# Patient Record
Sex: Female | Born: 1965
Health system: Southern US, Community
[De-identification: ages and names within clinical notes are randomized; demographics above are authoritative.]

## PROBLEM LIST (undated history)

## (undated) DIAGNOSIS — N189 Chronic kidney disease, unspecified: Secondary | ICD-10-CM

## (undated) DIAGNOSIS — I10 Essential (primary) hypertension: Secondary | ICD-10-CM

## (undated) DIAGNOSIS — R0600 Dyspnea, unspecified: Secondary | ICD-10-CM

## (undated) DIAGNOSIS — F419 Anxiety disorder, unspecified: Secondary | ICD-10-CM

## (undated) DIAGNOSIS — E039 Hypothyroidism, unspecified: Secondary | ICD-10-CM

## (undated) DIAGNOSIS — E079 Disorder of thyroid, unspecified: Secondary | ICD-10-CM

## (undated) DIAGNOSIS — I2510 Atherosclerotic heart disease of native coronary artery without angina pectoris: Secondary | ICD-10-CM

## (undated) DIAGNOSIS — F329 Major depressive disorder, single episode, unspecified: Secondary | ICD-10-CM

## (undated) DIAGNOSIS — I509 Heart failure, unspecified: Secondary | ICD-10-CM

## (undated) DIAGNOSIS — Z9189 Other specified personal risk factors, not elsewhere classified: Secondary | ICD-10-CM

## (undated) DIAGNOSIS — F32A Depression, unspecified: Secondary | ICD-10-CM

## (undated) DIAGNOSIS — R011 Cardiac murmur, unspecified: Secondary | ICD-10-CM

## (undated) DIAGNOSIS — D649 Anemia, unspecified: Secondary | ICD-10-CM

## (undated) DIAGNOSIS — D219 Benign neoplasm of connective and other soft tissue, unspecified: Secondary | ICD-10-CM

## (undated) DIAGNOSIS — Z9889 Other specified postprocedural states: Secondary | ICD-10-CM

## (undated) HISTORY — DX: Benign neoplasm of connective and other soft tissue, unspecified: D21.9

## (undated) HISTORY — DX: Anemia, unspecified: D64.9

## (undated) HISTORY — DX: Disorder of thyroid, unspecified: E07.9

---

## 2000-11-10 ENCOUNTER — Encounter: Admission: RE | Admit: 2000-11-10 | Discharge: 2000-11-10 | Payer: Self-pay | Admitting: Cardiology

## 2000-11-10 ENCOUNTER — Encounter: Payer: Self-pay | Admitting: Cardiology

## 2012-08-04 DIAGNOSIS — Z9289 Personal history of other medical treatment: Secondary | ICD-10-CM

## 2012-08-04 HISTORY — DX: Personal history of other medical treatment: Z92.89

## 2012-08-04 LAB — HM MAMMOGRAPHY: HM Mammogram: NORMAL

## 2012-10-25 ENCOUNTER — Inpatient Hospital Stay (HOSPITAL_COMMUNITY)
Admission: EM | Admit: 2012-10-25 | Discharge: 2012-11-01 | DRG: 286 | Disposition: A | Discharge status: Home / Self Care | Payer: 59 | Attending: Cardiology | Admitting: Cardiology

## 2012-10-25 ENCOUNTER — Emergency Department (HOSPITAL_COMMUNITY): Payer: 59

## 2012-10-25 ENCOUNTER — Encounter (HOSPITAL_COMMUNITY): Payer: Self-pay

## 2012-10-25 ENCOUNTER — Emergency Department (INDEPENDENT_AMBULATORY_CARE_PROVIDER_SITE_OTHER): Payer: 59

## 2012-10-25 ENCOUNTER — Encounter (HOSPITAL_COMMUNITY): Payer: Self-pay | Admitting: Emergency Medicine

## 2012-10-25 ENCOUNTER — Inpatient Hospital Stay (HOSPITAL_COMMUNITY): Payer: 59

## 2012-10-25 ENCOUNTER — Emergency Department (INDEPENDENT_AMBULATORY_CARE_PROVIDER_SITE_OTHER)
Admission: EM | Admit: 2012-10-25 | Discharge: 2012-10-25 | Disposition: A | Discharge status: Other Acute Inpatient Hospital | Payer: 59 | Source: Home / Self Care | Attending: Emergency Medicine | Admitting: Emergency Medicine

## 2012-10-25 DIAGNOSIS — I428 Other cardiomyopathies: Secondary | ICD-10-CM | POA: Diagnosis present

## 2012-10-25 DIAGNOSIS — N926 Irregular menstruation, unspecified: Secondary | ICD-10-CM | POA: Diagnosis present

## 2012-10-25 DIAGNOSIS — I10 Essential (primary) hypertension: Secondary | ICD-10-CM | POA: Diagnosis present

## 2012-10-25 DIAGNOSIS — R0609 Other forms of dyspnea: Secondary | ICD-10-CM

## 2012-10-25 DIAGNOSIS — D649 Anemia, unspecified: Secondary | ICD-10-CM | POA: Diagnosis present

## 2012-10-25 DIAGNOSIS — J90 Pleural effusion, not elsewhere classified: Secondary | ICD-10-CM

## 2012-10-25 DIAGNOSIS — I5043 Acute on chronic combined systolic (congestive) and diastolic (congestive) heart failure: Secondary | ICD-10-CM | POA: Diagnosis present

## 2012-10-25 DIAGNOSIS — Z87891 Personal history of nicotine dependence: Secondary | ICD-10-CM

## 2012-10-25 DIAGNOSIS — R0989 Other specified symptoms and signs involving the circulatory and respiratory systems: Secondary | ICD-10-CM

## 2012-10-25 DIAGNOSIS — R778 Other specified abnormalities of plasma proteins: Secondary | ICD-10-CM

## 2012-10-25 DIAGNOSIS — Z9119 Patient's noncompliance with other medical treatment and regimen: Secondary | ICD-10-CM

## 2012-10-25 DIAGNOSIS — I5042 Chronic combined systolic (congestive) and diastolic (congestive) heart failure: Secondary | ICD-10-CM | POA: Diagnosis present

## 2012-10-25 DIAGNOSIS — E876 Hypokalemia: Secondary | ICD-10-CM | POA: Diagnosis present

## 2012-10-25 DIAGNOSIS — D259 Leiomyoma of uterus, unspecified: Secondary | ICD-10-CM | POA: Diagnosis present

## 2012-10-25 DIAGNOSIS — Z9889 Other specified postprocedural states: Secondary | ICD-10-CM

## 2012-10-25 DIAGNOSIS — I5021 Acute systolic (congestive) heart failure: Secondary | ICD-10-CM | POA: Diagnosis present

## 2012-10-25 DIAGNOSIS — Z9189 Other specified personal risk factors, not elsewhere classified: Secondary | ICD-10-CM | POA: Diagnosis present

## 2012-10-25 DIAGNOSIS — D500 Iron deficiency anemia secondary to blood loss (chronic): Secondary | ICD-10-CM | POA: Diagnosis present

## 2012-10-25 DIAGNOSIS — D250 Submucous leiomyoma of uterus: Secondary | ICD-10-CM | POA: Diagnosis present

## 2012-10-25 DIAGNOSIS — J9600 Acute respiratory failure, unspecified whether with hypoxia or hypercapnia: Secondary | ICD-10-CM | POA: Diagnosis present

## 2012-10-25 DIAGNOSIS — E0781 Sick-euthyroid syndrome: Secondary | ICD-10-CM | POA: Diagnosis present

## 2012-10-25 DIAGNOSIS — I110 Hypertensive heart disease with heart failure: Principal | ICD-10-CM | POA: Diagnosis present

## 2012-10-25 DIAGNOSIS — R7989 Other specified abnormal findings of blood chemistry: Secondary | ICD-10-CM | POA: Diagnosis present

## 2012-10-25 DIAGNOSIS — R0600 Dyspnea, unspecified: Secondary | ICD-10-CM

## 2012-10-25 DIAGNOSIS — Z885 Allergy status to narcotic agent status: Secondary | ICD-10-CM

## 2012-10-25 DIAGNOSIS — Z91199 Patient's noncompliance with other medical treatment and regimen due to unspecified reason: Secondary | ICD-10-CM

## 2012-10-25 DIAGNOSIS — I509 Heart failure, unspecified: Secondary | ICD-10-CM | POA: Diagnosis present

## 2012-10-25 DIAGNOSIS — R946 Abnormal results of thyroid function studies: Secondary | ICD-10-CM | POA: Diagnosis present

## 2012-10-25 HISTORY — DX: Essential (primary) hypertension: I10

## 2012-10-25 HISTORY — DX: Other specified postprocedural states: Z98.890

## 2012-10-25 HISTORY — DX: Heart failure, unspecified: I50.9

## 2012-10-25 HISTORY — DX: Other specified personal risk factors, not elsewhere classified: Z91.89

## 2012-10-25 LAB — CBC WITH DIFFERENTIAL/PLATELET
Basophils Absolute: 0 10*3/uL (ref 0.0–0.1)
Basophils Relative: 0 % (ref 0–1)
Eosinophils Absolute: 0 10*3/uL (ref 0.0–0.7)
Eosinophils Relative: 0 % (ref 0–5)
HCT: 23.9 % — ABNORMAL LOW (ref 36.0–46.0)
Hemoglobin: 6.5 g/dL — CL (ref 12.0–15.0)
Lymphocytes Relative: 23 % (ref 12–46)
Lymphs Abs: 1.4 10*3/uL (ref 0.7–4.0)
MCH: 17.2 pg — ABNORMAL LOW (ref 26.0–34.0)
MCHC: 27.2 g/dL — ABNORMAL LOW (ref 30.0–36.0)
MCV: 63.4 fL — ABNORMAL LOW (ref 78.0–100.0)
Monocytes Absolute: 0.3 10*3/uL (ref 0.1–1.0)
Monocytes Relative: 5 % (ref 3–12)
Neutro Abs: 4.6 10*3/uL (ref 1.7–7.7)
Neutrophils Relative %: 72 % (ref 43–77)
Platelets: 223 10*3/uL (ref 150–400)
RBC: 3.77 MIL/uL — ABNORMAL LOW (ref 3.87–5.11)
RDW: 21.9 % — ABNORMAL HIGH (ref 11.5–15.5)
WBC: 6.3 10*3/uL (ref 4.0–10.5)

## 2012-10-25 LAB — URINALYSIS, ROUTINE W REFLEX MICROSCOPIC
Bilirubin Urine: NEGATIVE
Glucose, UA: NEGATIVE mg/dL
Ketones, ur: NEGATIVE mg/dL
Leukocytes, UA: NEGATIVE
Nitrite: NEGATIVE
Protein, ur: >300 mg/dL — AB
Specific Gravity, Urine: 1.028 (ref 1.005–1.030)
Urobilinogen, UA: 0.2 mg/dL (ref 0.0–1.0)
pH: 6.5 (ref 5.0–8.0)

## 2012-10-25 LAB — PRO B NATRIURETIC PEPTIDE: Pro B Natriuretic peptide (BNP): 3831 pg/mL — ABNORMAL HIGH (ref 0–125)

## 2012-10-25 LAB — COMPREHENSIVE METABOLIC PANEL
ALT: 13 U/L (ref 0–35)
AST: 34 U/L (ref 0–37)
Albumin: 2.9 g/dL — ABNORMAL LOW (ref 3.5–5.2)
Alkaline Phosphatase: 72 U/L (ref 39–117)
BUN: 10 mg/dL (ref 6–23)
CO2: 21 mEq/L (ref 19–32)
Calcium: 9 mg/dL (ref 8.4–10.5)
Chloride: 105 mEq/L (ref 96–112)
Creatinine, Ser: 0.56 mg/dL (ref 0.50–1.10)
GFR calc Af Amer: >90 mL/min (ref >90)
GFR calc non Af Amer: >90 mL/min (ref >90)
Glucose, Bld: 93 mg/dL (ref 70–99)
Potassium: 3.6 mEq/L (ref 3.5–5.1)
Sodium: 138 mEq/L (ref 135–145)
Total Bilirubin: 0.2 mg/dL — ABNORMAL LOW (ref 0.3–1.2)
Total Protein: 7.7 g/dL (ref 6.0–8.3)

## 2012-10-25 LAB — CK TOTAL AND CKMB (NOT AT ARMC)
CK, MB: 10 ng/mL (ref 0.3–4.0)
Relative Index: 2.7 — ABNORMAL HIGH (ref 0.0–2.5)
Total CK: 364 U/L — ABNORMAL HIGH (ref 7–177)

## 2012-10-25 LAB — D-DIMER, QUANTITATIVE: D-Dimer, Quant: 2.62 ug/mL-FEU — ABNORMAL HIGH (ref 0.00–0.48)

## 2012-10-25 LAB — POCT I-STAT TROPONIN I: Troponin i, poc: 0.1 ng/mL (ref 0.00–0.08)

## 2012-10-25 LAB — URINE MICROSCOPIC-ADD ON

## 2012-10-25 LAB — DIRECT ANTIGLOBULIN TEST (NOT AT ARMC)
DAT, IgG: NEGATIVE
DAT, complement: NEGATIVE

## 2012-10-25 LAB — RETICULOCYTES
RBC.: 4.11 MIL/uL (ref 3.87–5.11)
Retic Count, Absolute: 53.4 10*3/uL (ref 19.0–186.0)
Retic Ct Pct: 1.3 % (ref 0.4–3.1)

## 2012-10-25 LAB — ABO/RH: ABO/RH(D): A POS

## 2012-10-25 LAB — APTT: aPTT: 38 seconds — ABNORMAL HIGH (ref 24–37)

## 2012-10-25 LAB — TROPONIN I
Troponin I: <0.3 ng/mL (ref <0.30)
Troponin I: <0.3 ng/mL (ref <0.30)

## 2012-10-25 LAB — MRSA PCR SCREENING: MRSA by PCR: NEGATIVE

## 2012-10-25 LAB — LACTATE DEHYDROGENASE: LDH: 334 U/L — ABNORMAL HIGH (ref 94–250)

## 2012-10-25 LAB — OCCULT BLOOD, POC DEVICE: Fecal Occult Bld: NEGATIVE

## 2012-10-25 LAB — PROTIME-INR
INR: 1.02 (ref 0.00–1.49)
Prothrombin Time: 13.3 seconds (ref 11.6–15.2)

## 2012-10-25 LAB — PREPARE RBC (CROSSMATCH)

## 2012-10-25 MED ORDER — IOHEXOL 350 MG/ML SOLN
100.0000 mL | Freq: Once | INTRAVENOUS | Status: AC | PRN
  Administered 2012-10-25: 60 mL via INTRAVENOUS

## 2012-10-25 MED ORDER — ACETAMINOPHEN 325 MG PO TABS: 650.0000 mg | ORAL_TABLET | ORAL | Status: DC | PRN

## 2012-10-25 MED ORDER — SODIUM CHLORIDE 0.9 % IV SOLN: 250.0000 mL | INTRAVENOUS | Status: DC | PRN

## 2012-10-25 MED ORDER — SODIUM CHLORIDE 0.9 % IJ SOLN
3.0000 mL | Freq: Two times a day (BID) | INTRAMUSCULAR | Status: DC
  Administered 2012-10-25 – 2012-10-26 (×3): 3 mL via INTRAVENOUS

## 2012-10-25 MED ORDER — SODIUM CHLORIDE 0.9 % IJ SOLN: 3.0000 mL | INTRAMUSCULAR | Status: DC | PRN

## 2012-10-25 MED ORDER — FUROSEMIDE 10 MG/ML IJ SOLN
20.0000 mg | Freq: Once | INTRAMUSCULAR | Status: AC
  Administered 2012-10-25: 20 mg via INTRAVENOUS

## 2012-10-25 MED ORDER — FUROSEMIDE 10 MG/ML IJ SOLN
40.0000 mg | Freq: Once | INTRAMUSCULAR | Status: DC
  Filled 2012-10-25: qty 4

## 2012-10-25 MED ORDER — ONDANSETRON HCL 4 MG/2ML IJ SOLN: 4.0000 mg | Freq: Four times a day (QID) | INTRAMUSCULAR | Status: DC | PRN

## 2012-10-25 MED ORDER — FUROSEMIDE 10 MG/ML IJ SOLN
40.0000 mg | Freq: Every day | INTRAMUSCULAR | Status: DC
  Administered 2012-10-26 – 2012-10-27 (×2): 40 mg via INTRAVENOUS
  Filled 2012-10-25 (×2): qty 4

## 2012-10-25 MED ORDER — METOPROLOL TARTRATE 1 MG/ML IV SOLN
5.0000 mg | Freq: Once | INTRAVENOUS | Status: AC
  Administered 2012-10-25: 5 mg via INTRAVENOUS
  Filled 2012-10-25: qty 5

## 2012-10-25 MED ORDER — FUROSEMIDE 10 MG/ML IJ SOLN
40.0000 mg | Freq: Every day | INTRAMUSCULAR | Status: DC
  Administered 2012-10-25: 40 mg via INTRAVENOUS
  Filled 2012-10-25 (×2): qty 4

## 2012-10-25 MED ORDER — CARVEDILOL 3.125 MG PO TABS
3.1250 mg | ORAL_TABLET | Freq: Two times a day (BID) | ORAL | Status: DC
  Administered 2012-10-26 – 2012-10-28 (×5): 3.125 mg via ORAL
  Filled 2012-10-25 (×8): qty 1

## 2012-10-25 NOTE — H&P (Signed)
History and Physical       Hospital Admission Note Date: 10/25/2012  Patient name: Miranda [name] Medical record number: [id number] Date of birth: 07/02/1966 Age: 47 y.o. Gender: female PCP: Patient has no PCP    Chief Complaint:  Dyspnea worse in the last 3 days  HPI: Patient is a 47 year old female with history of hypertension (has not been taking medication for last 10 years) was sent from the [facility] urgent care facility for worsening shortness of breath. History was obtained from the patient who stated that she had noticed increased shortness of breath with exertion for last 3 days since Friday. She had 2 pillow orthopnea and was unable to sleep last night due to shortness of breath. She denied any chest pain. She has had history of hypertension but stated that she had kept it down herself and was not taking medications for last 10 years. Otherwise she denied any chest pain, any fever chills or productive cough. She had been coughing some due to congestion. She denied any abdominal pain nausea, vomiting or any hematemesis. ED workup showed severe anemia with hemoglobin of 6.5, hematocrit 23.9, troponin positive at 0.1, elevated d-dimer at 2.62. At the time of triage BP was 182/109, HR 111, at the time of my encounter, HR is in 120-130's, sinus tachycardia, BP 171/101. CT angiogram chest has been done but the results are pending. Patient also reported that she has irregular menstrual periods, last month had about 9-10 days long period with clots, today she had "little". Patient also has not seen a PCP or GYN MD in years.   Review of Systems:  Constitutional: Denies fever, chills, diaphoresis, + poor appetite with fatigue.  HEENT: Denies photophobia, eye pain, redness, hearing loss, ear pain, congestion, sore throat, rhinorrhea, sneezing, mouth sores, trouble swallowing, neck pain, neck stiffness and tinnitus.   Respiratory: Please see history  of present illness Cardiovascular: Please see history of present illness Gastrointestinal: Denies nausea, vomiting, abdominal pain, diarrhea, constipation, blood in stool and abdominal distention.  Genitourinary: Denies dysuria, urgency, frequency, hematuria, flank pain and difficulty urinating. Please see history of present illness Musculoskeletal: Denies myalgias, back pain, joint swelling, arthralgias and gait problem.  Skin: Denies rash and wound. ++pallor Neurological: Denies seizures, syncope,  numbness and headaches. + dizzyness Hematological: Denies adenopathy. Easy bruising, personal or family bleeding history  Psychiatric/Behavioral: Denies suicidal ideation, mood changes, confusion, nervousness, sleep disturbance and agitation  Past Medical History: Past Medical History  Diagnosis Date  . Hypertension    History reviewed. No pertinent past surgical history.  Medications: Prior to Admission medications   Medication Sig Start Date End Date Taking? Authorizing Provider  oxymetazoline (AFRIN) 0.05 % nasal spray Place 2 sprays into the nose 2 (two) times daily.   Yes Historical Provider, MD  Phenol-Glycerin (VICKS 44 SORE THROAT MT) Take 15 mLs by mouth every 6 (six) hours as needed (cough).    Yes Historical Provider, MD    Allergies:   Allergies  Allergen Reactions  . Codeine Hives    Social History: Patient states that she smokes one cigar a day, drinks alcohol occasionally and uses marijuana as recreational drug, she lives at home with her son. She is functional with her ADLs  Family History: History reviewed. Patient denies any history of coronary artery disease, CHF in her family. No family history of pulmonary embolism or DVT  Physical Exam: Blood pressure 171/101, pulse 117, temperature 97.9 F (36.6 C), temperature source Oral, resp. rate 35, SpO2 93.00%. General:  Alert, awake, oriented x3, in no acute distress, uncomfortable, very pale appearing. HEENT:  normocephalic, atraumatic, anicteric sclera, pale conjunctiva, pupils equal and reactive to light and accomodation, oropharynx clear Neck: supple, no masses or lymphadenopathy, no goiter, no bruits + JVP Heart: Tachycardia, Regular rate and rhythm, without murmurs, rubs or gallops. Lungs: Bibasilar crackles  Abdomen: Soft, nontender, nondistended, positive bowel sounds, no masses. Extremities: No clubbing, cyanosis or edema with positive pedal pulses. Neuro: Grossly intact, no focal neurological deficits, strength 5/5 upper and lower extremities bilaterally Psych: alert and oriented x 3, normal mood and affect Skin: no rashes or lesions, warm and dry   LABS on Admission:  Basic Metabolic Panel:  Recent Labs Lab 10/25/12 1306  NA 138  K 3.6  CL 105  CO2 21  GLUCOSE 93  BUN 10  CREATININE 0.56  CALCIUM 9.0   Liver Function Tests:  Recent Labs Lab 10/25/12 1306  AST 34  ALT 13  ALKPHOS 72  BILITOT 0.2*  PROT 7.7  ALBUMIN 2.9*   CBC:  Recent Labs Lab 10/25/12 1306  WBC 6.3  NEUTROABS 4.6  HGB 6.5*  HCT 23.9*  MCV 63.4*  PLT 223   Cardiac Enzymes: No results found for this basename: CKTOTAL, CKMB, CKMBINDEX, TROPONINI,  in the last 168 hours BNP: No components found with this basename: POCBNP,  CBG: No results found for this basename: GLUCAP,  in the last 168 hours   Radiological Exams on Admission: Dg Chest 2 View  10/25/2012  *RADIOLOGY REPORT*  Clinical Data: Shortness of breath and chest pain  CHEST - 2 VIEW  Comparison: None.  Findings: Two-view exam shows bibasilar airspace disease with small bilateral pleural effusions.  Cardiopericardial silhouette is enlarged. Imaged bony structures of the thorax are intact.  IMPRESSION: Enlargement of the cardiopericardial silhouette with bibasilar airspace disease and small bilateral pleural effusions.  Imaging features may be related to dependent edema or infection. Pericardial effusion is not excluded.   Original  Report Authenticated By: Kennith Center, M.D.    Dg Abd 1 View  10/25/2012  *RADIOLOGY REPORT*  Clinical Data: Generalized abdominal pain  ABDOMEN - 1 VIEW  Comparison: None.  Findings: Supine view of the abdomen shows no gaseous bowel dilatation to suggest obstruction.  There are some phleboliths over the inferior anatomic pelvis bilaterally.  Otherwise no unexpected abdominopelvic calcification.  The visualized bony structures are normal.  IMPRESSION: Normal bowel gas pattern.   Original Report Authenticated By: Kennith Center, M.D.    Ct Angio Chest Pe W/cm &/or Wo Cm  10/25/2012  *RADIOLOGY REPORT*  Clinical Data: Cough, fever and shortness of breath.  CT ANGIOGRAPHY CHEST  Technique:  Multidetector CT imaging of the chest using the standard protocol during bolus administration of intravenous contrast. Multiplanar reconstructed images including MIPs were obtained and reviewed to evaluate the vascular anatomy.  Contrast: 60mL OMNIPAQUE IOHEXOL 350 MG/ML SOLN  Comparison: Chest radiographs obtained earlier today.  Findings: Normally opacified pulmonary arteries with no pulmonary arterial filling defects seen.  Small to moderate-sized bilateral pleural effusions, larger on the right.  Enlarged heart.  Small pericardial effusion with a maximum thickness of 5 mm.  No lung masses. Mildly enlarged bilateral axillary lymph nodes, including a 9 mm [name] axis left axillary lymph node on image number 15 and 8 mm [name] axis right axillary lymph node on image number 26.  No enlarged mediastinal, hilar or upper abdominal lymph nodes.  The interstitial markings are prominent.  Mild bibasilar opacity with an  appearance most compatible with atelectasis.  Unremarkable bones and upper abdomen.  IMPRESSION:  1.  Cardiomegaly and changes of congestive heart failure with bilateral pleural effusions. 2.  Mild bibasilar atelectasis without definite pneumonia. 3.  Small pericardial effusion. 4.  Mild bilateral axillary adenopathy. 5.   No pulmonary emboli.   Original Report Authenticated By: Beckie Salts, M.D.     Assessment/Plan Principal Problem:   Dyspnea: Likely precipitated by severe anemia, acute CHF, accelerated hypertension due to noncompliance, small pericardial effusion on CT angiogram chest. Elevated troponin - Admit to step down, obtain serial cardiac enzymes, TSH, anemia panel - Patient needs to units of packed RBC transfusion but will also need Lasix in between and prior to transfusion due to acute CHF. - Obtain 2-D echocardiogram for further workup, assess EF, pericardial effusion - Placed on CHF pathway, Coreg, IV Lasix. Hold off on ASA secondary to symptomatic anemia (?bleeding).  Active problems Elevated troponins with accelerated hypertension and acute CHF, with tachycardia - Obtain serial cardiac enzymes, place on IV diuresis, strict I.'s and O.'s, daily weights, lopressor 5mg  IV x 1  - Placed on Coreg, obtain 2-D echocardiogram - CT angiogram of the chest was done and was negative for acute PE    Anemia-microcytic - Obtain anemia panel, TSH, urine pregnancy test - Obtain pelvic ultrasound to rule out any uterine fibroids - Patient denies any GI losses, however if the pelvic ultrasound is completely negative, she may need a hematology consult or/and an outpatient colonoscopy.  DVT prophylaxis: SCD's   CODE STATUS: Full Code  Further plan will depend as patient's clinical course evolves and further radiologic and laboratory data become available.   Time Spent on Admission: 1 hour  Markeesha Char M.D. [facility] 10/25/2012, 4:39 PM Pager: [phone]  If 7PM-7AM, please contact night-coverage [email or web address] Password [email or web address]

## 2012-10-25 NOTE — ED Provider Notes (Addendum)
History     CSN: [id number]  Arrival date & time 10/25/12  1010   First MD Initiated Contact with Patient 10/25/12 1021      Chief Complaint  Patient presents with  . Shortness of Breath    (Consider location/radiation/quality/duration/timing/severity/associated sxs/prior treatment) HPI Comments: Patient presents urgent care describing that she's been coughing daily for approximately 6 months. Since Friday she's been noticing increased shortness of breath with minimal activity and even with positional changes as she is describing that when she leans forward she breathes much better and she can barely tolerate being in a flat or supine position. Patient denies any chest pain at rest and describes that she has had high blood pressure for years but have been off treatment for many years. Patient denies any fevers body aches but does have a bit of a sinus congestion she describes. Denies any abdominal pain nausea or vomiting  Patient is a 47 y.o. female presenting with shortness of breath. The history is provided by the patient.  Shortness of Breath Severity:  Moderate Onset quality:  Gradual Duration:  3 days Chronicity:  Recurrent Context: activity   Relieved by:  Position changes Worsened by:  Movement, exertion, coughing and activity (Laying flat) Associated symptoms: cough   Associated symptoms: no abdominal pain, no chest pain, no diaphoresis, no headaches, no hemoptysis, no neck pain, no rash, no syncope, no vomiting and no wheezing   Risk factors: no family hx of DVT and no hx of PE/DVT     Past Medical History  Diagnosis Date  . Hypertension     History reviewed. No pertinent past surgical history.  History reviewed. No pertinent family history.  History  Substance Use Topics  . Smoking status: Not on file  . Smokeless tobacco: Not on file  . Alcohol Use: Not on file    OB History   Grav Para Term Preterm Abortions TAB SAB Ect Mult Living                   Review of Systems  Constitutional: Positive for activity change and fatigue. Negative for chills, diaphoresis, appetite change and unexpected weight change.  HENT: Negative for neck pain.   Respiratory: Positive for cough and shortness of breath. Negative for hemoptysis and wheezing.   Cardiovascular: Negative for chest pain, palpitations, leg swelling and syncope.  Gastrointestinal: Negative for vomiting and abdominal pain.  Skin: Negative for color change, rash and wound.  Neurological: Negative for headaches.    Allergies  Review of patient's allergies indicates no known allergies.  Home Medications  No current outpatient prescriptions on file.  BP 182/109  Pulse 111  Temp(Src) 99.3 F (37.4 C) (Oral)  Resp 26  SpO2 100%  Physical Exam  Nursing note and vitals reviewed. Constitutional: She appears well-developed.  Non-toxic appearance. She does not have a sickly appearance. She appears ill. No distress.  Cardiovascular: Normal pulses.  Tachycardia present.  Exam reveals no gallop and no friction rub.   No murmur heard. Pulmonary/Chest: Breath sounds normal. No respiratory distress. She has no decreased breath sounds. She has no rhonchi. She has no rales.  Neurological: She is alert.    ED Course  Procedures (including critical care time)  Labs Reviewed - No data to display Dg Chest 2 View  10/25/2012  *RADIOLOGY REPORT*  Clinical Data: Shortness of breath and chest pain  CHEST - 2 VIEW  Comparison: None.  Findings: Two-view exam shows bibasilar airspace disease with small bilateral pleural  effusions.  Cardiopericardial silhouette is enlarged. Imaged bony structures of the thorax are intact.  IMPRESSION: Enlargement of the cardiopericardial silhouette with bibasilar airspace disease and small bilateral pleural effusions.  Imaging features may be related to dependent edema or infection. Pericardial effusion is not excluded.   Original Report Authenticated By: Misty Stanley,  M.D.    Dg Abd 1 View  10/25/2012  *RADIOLOGY REPORT*  Clinical Data: Generalized abdominal pain  ABDOMEN - 1 VIEW  Comparison: None.  Findings: Supine view of the abdomen shows no gaseous bowel dilatation to suggest obstruction.  There are some phleboliths over the inferior anatomic pelvis bilaterally.  Otherwise no unexpected abdominopelvic calcification.  The visualized bony structures are normal.  IMPRESSION: Normal bowel gas pattern.   Original Report Authenticated By: Misty Stanley, M.D.      1. Dyspnea   2. Pleural effusion    EKG- sinus tachycardia with a ventricular rate of 106. Electrocardiographic signs consistent with left ventricular and atrial enlargement nonspecific T-wave abnormalities no ST segment elevation or depression is noted PR interval and QRS duration within normal no acute T. prolongation  Bedside informational (non-diagnostic) ultrasound done where a parasternal long axis view was performed- was inconclusive as we were looking for obvious signs of a pericardial effusion. MDM  Symptomatology most suggestive of pericarditis we'll transfer patient to the emergency department for further evaluation. Patient is tachycardic- hypertensive. Initial imaging it suggestive of a suspected pericardial effusion. Patient is hemodynamically stable. Dyspneic- and symptomatic for 6 months- exacerbation, the last 3 days.       Rosana Hoes, MD 10/25/12 1235  Rosana Hoes, MD 10/25/12 [phone]

## 2012-10-25 NOTE — ED Notes (Signed)
Patient transported to CT 

## 2012-10-25 NOTE — ED Notes (Signed)
Called blood bank, stating blood not ready at this time. Notified patient will be transported up to [id number]

## 2012-10-25 NOTE — ED Provider Notes (Signed)
History     CSN: [id number]  Arrival date & time 10/25/12  1243   First MD Initiated Contact with Patient 10/25/12 1419      Chief Complaint  Patient presents with  . Shortness of Breath    (Consider location/radiation/quality/duration/timing/severity/associated sxs/prior treatment) Patient is a 47 y.o. female presenting with shortness of breath. The history is provided by the patient.  Shortness of Breath Associated symptoms: no abdominal pain, no chest pain, no cough, no fever, no headaches, no neck pain, no rash and no vomiting   pt c/o dyspnea for the past 3-4 days. Constant, persistent. Worse w exertion. Also notes faint/lightheadedness when stands, has been feeling generally weak for past few days. Denies any chest pain or discomfort. Denies hx lung or heart disease. Former smoker. Denies leg pain or swelling. No hx dvt or pe. Denies personal or family hx cad. No hx chf. Denies orthopnea or pnd. Denies cough or uri c/o. No fever or chills   Pt appears pale. Denies hx anemia or prior fe use. Denies recent blood loss. States is peri-menopausal.  Denies any recent vaginal bleeding or hx heavy bleeding w periods. Denies rectal bleeding or melena, x single episode small am brbpr after episode straining/constipatuion a couple weeks ago. No hx gi bleeding. No hx diverticula or pud. No asa or nsaid use. Denies hx renal disease. No abd pain. No nvd. No wt change. Pt went to urgent care and sent to ed.     Past Medical History  Diagnosis Date  . Hypertension     History reviewed. No pertinent past surgical history.  History reviewed. No pertinent family history.  History  Substance Use Topics  . Smoking status: Not on file  . Smokeless tobacco: Not on file  . Alcohol Use: Not on file    OB History   Grav Para Term Preterm Abortions TAB SAB Ect Mult Living                  Review of Systems  Constitutional: Negative for fever and chills.  HENT: Negative for neck pain.    Eyes: Negative for redness.  Respiratory: Positive for shortness of breath. Negative for cough.   Cardiovascular: Negative for chest pain and leg swelling.  Gastrointestinal: Negative for nausea, vomiting, abdominal pain, diarrhea and blood in stool.  Genitourinary: Negative for dysuria and flank pain.  Musculoskeletal: Negative for back pain.  Skin: Negative for rash.  Neurological: Positive for light-headedness. Negative for headaches.  Hematological: Does not bruise/bleed easily.  Psychiatric/Behavioral: Negative for confusion.    Allergies  Codeine  Home Medications   Current Outpatient Rx  Name  Route  Sig  Dispense  Refill  . oxymetazoline (AFRIN) 0.05 % nasal spray   Nasal   Place 2 sprays into the nose 2 (two) times daily.         . Phenol-Glycerin (VICKS 44 SORE THROAT MT)   Oral   Take 15 mLs by mouth every 6 (six) hours as needed (cough).            BP 161/103  Pulse 72  Temp(Src) 97.9 F (36.6 C) (Oral)  Resp 18  SpO2 100%  Physical Exam  Nursing note and vitals reviewed. Constitutional: She is oriented to person, place, and time. No distress.  Very pale appearing  HENT:  Head: Atraumatic.  Mouth/Throat: Oropharynx is clear and moist.  Eyes: Conjunctivae are normal. Pupils are equal, round, and reactive to light. No scleral icterus.  Neck: Neck  supple. No tracheal deviation present. No thyromegaly present.  Cardiovascular: Regular rhythm, normal heart sounds and intact distal pulses.   Pulmonary/Chest: Effort normal and breath sounds normal. No respiratory distress.  Abdominal: Soft. Normal appearance and bowel sounds are normal. She exhibits no distension and no mass. There is no tenderness. There is no rebound and no guarding.  Genitourinary:  Scant yellow stool, grossly negative.   Musculoskeletal: She exhibits no edema and no tenderness.  Neurological: She is alert and oriented to person, place, and time.  Skin: Skin is warm and dry. No rash  noted. She is not diaphoretic. There is pallor.  Psychiatric: She has a normal mood and affect.    ED Course  Procedures (including critical care time)  Results for orders placed during the hospital encounter of 10/25/12  CBC WITH DIFFERENTIAL      Result Value Range   WBC 6.3  4.0 - 10.5 K/uL   RBC 3.77 (*) 3.87 - 5.11 MIL/uL   Hemoglobin 6.5 (*) 12.0 - 15.0 g/dL   HCT 19.1 (*) 47.8 - 29.5 %   MCV 63.4 (*) 78.0 - 100.0 fL   MCH 17.2 (*) 26.0 - 34.0 pg   MCHC 27.2 (*) 30.0 - 36.0 g/dL   RDW 62.1 (*) 30.8 - 65.7 %   Platelets 223  150 - 400 K/uL   Neutrophils Relative 72  43 - 77 %   Lymphocytes Relative 23  12 - 46 %   Monocytes Relative 5  3 - 12 %   Eosinophils Relative 0  0 - 5 %   Basophils Relative 0  0 - 1 %   Neutro Abs 4.6  1.7 - 7.7 K/uL   Lymphs Abs 1.4  0.7 - 4.0 K/uL   Monocytes Absolute 0.3  0.1 - 1.0 K/uL   Eosinophils Absolute 0.0  0.0 - 0.7 K/uL   Basophils Absolute 0.0  0.0 - 0.1 K/uL   RBC Morphology POLYCHROMASIA PRESENT    COMPREHENSIVE METABOLIC PANEL      Result Value Range   Sodium 138  135 - 145 mEq/L   Potassium 3.6  3.5 - 5.1 mEq/L   Chloride 105  96 - 112 mEq/L   CO2 21  19 - 32 mEq/L   Glucose, Bld 93  70 - 99 mg/dL   BUN 10  6 - 23 mg/dL   Creatinine, Ser 8.46  0.50 - 1.10 mg/dL   Calcium 9.0  8.4 - 96.2 mg/dL   Total Protein 7.7  6.0 - 8.3 g/dL   Albumin 2.9 (*) 3.5 - 5.2 g/dL   AST 34  0 - 37 U/L   ALT 13  0 - 35 U/L   Alkaline Phosphatase 72  39 - 117 U/L   Total Bilirubin 0.2 (*) 0.3 - 1.2 mg/dL   GFR calc non Af Amer >90  >90 mL/min   GFR calc Af Amer >90  >90 mL/min  PRO B NATRIURETIC PEPTIDE      Result Value Range   Pro B Natriuretic peptide (BNP) 3831.0 (*) 0 - 125 pg/mL  D-DIMER, QUANTITATIVE      Result Value Range   D-Dimer, Quant 2.62 (*) 0.00 - 0.48 ug/mL-FEU  POCT I-STAT TROPONIN I      Result Value Range   Troponin i, poc 0.10 (*) 0.00 - 0.08 ng/mL   Comment NOTIFIED PHYSICIAN     Comment 3           OCCULT BLOOD,  POC DEVICE  Result Value Range   Fecal Occult Bld NEGATIVE  NEGATIVE  TYPE AND SCREEN      Result Value Range   ABO/RH(D) A POS     Antibody Screen PENDING     Sample Expiration 10/28/2012     Dg Chest 2 View  10/25/2012  *RADIOLOGY REPORT*  Clinical Data: Shortness of breath and chest pain  CHEST - 2 VIEW  Comparison: None.  Findings: Two-view exam shows bibasilar airspace disease with small bilateral pleural effusions.  Cardiopericardial silhouette is enlarged. Imaged bony structures of the thorax are intact.  IMPRESSION: Enlargement of the cardiopericardial silhouette with bibasilar airspace disease and small bilateral pleural effusions.  Imaging features may be related to dependent edema or infection. Pericardial effusion is not excluded.   Original Report Authenticated By: Kennith Center, M.D.    Dg Abd 1 View  10/25/2012  *RADIOLOGY REPORT*  Clinical Data: Generalized abdominal pain  ABDOMEN - 1 VIEW  Comparison: None.  Findings: Supine view of the abdomen shows no gaseous bowel dilatation to suggest obstruction.  There are some phleboliths over the inferior anatomic pelvis bilaterally.  Otherwise no unexpected abdominopelvic calcification.  The visualized bony structures are normal.  IMPRESSION: Normal bowel gas pattern.   Original Report Authenticated By: Kennith Center, M.D.    Ct Angio Chest Pe W/cm &/or Wo Cm  10/25/2012  *RADIOLOGY REPORT*  Clinical Data: Cough, fever and shortness of breath.  CT ANGIOGRAPHY CHEST  Technique:  Multidetector CT imaging of the chest using the standard protocol during bolus administration of intravenous contrast. Multiplanar reconstructed images including MIPs were obtained and reviewed to evaluate the vascular anatomy.  Contrast: 60mL OMNIPAQUE IOHEXOL 350 MG/ML SOLN  Comparison: Chest radiographs obtained earlier today.  Findings: Normally opacified pulmonary arteries with no pulmonary arterial filling defects seen.  Small to moderate-sized bilateral  pleural effusions, larger on the right.  Enlarged heart.  Small pericardial effusion with a maximum thickness of 5 mm.  No lung masses. Mildly enlarged bilateral axillary lymph nodes, including a 9 mm short axis left axillary lymph node on image number 15 and 8 mm short axis right axillary lymph node on image number 26.  No enlarged mediastinal, hilar or upper abdominal lymph nodes.  The interstitial markings are prominent.  Mild bibasilar opacity with an appearance most compatible with atelectasis.  Unremarkable bones and upper abdomen.  IMPRESSION:  1.  Cardiomegaly and changes of congestive heart failure with bilateral pleural effusions. 2.  Mild bibasilar atelectasis without definite pneumonia. 3.  Small pericardial effusion. 4.  Mild bilateral axillary adenopathy. 5.  No pulmonary emboli.   Original Report Authenticated By: Beckie Salts, M.D.       MDM  Iv ns. Monitor. Ecg. Labs. cxr reviewed from earlier.  hgb 6, pt symptomatic, weak/faint, mildly tachy, will transfuse.  Given dyspnea, markedly elevated ddimer, bil atelx on cxr, will get ct angio r/o pe.   Reviewed nursing notes and prior charts for additional history.    Date: 10/25/2012  Rate: 93  Rhythm: normal sinus rhythm  QRS Axis: normal  Intervals: normal  ST/T Wave abnormalities: normal  Conduction Disutrbances:lvh  Narrative Interpretation:   Old EKG Reviewed: none available  Ct neg for pe.   ?high output chf related to signif anemia.    Transfuse prbc.   [facility] called to admit.  Recheck pt. No cp or sob.  abd soft nt.            Suzi Roots, MD 10/25/12 [phone]

## 2012-10-25 NOTE — Progress Notes (Signed)
CRITICAL VALUE ALERT  Critical value received:  CKMB=10.0  Date of notification:  10/25/2012   Time of notification:  1848   Critical value read back:yes  Nurse who received alert:  Nyra Market  MD notified (1st page):  Dr. Isidoro Donning  Time of first page:  1853  MD notified (2nd page):  Time of second page:  Responding MD:  Dr. Isidoro Donning   Time MD responded:  [phone]

## 2012-10-25 NOTE — ED Notes (Signed)
Pt sent from [facility] with increased SOB x several days; pt sts [facility] said that he had fluid in lungs; pt denies CP or increased swelling

## 2012-10-25 NOTE — ED Notes (Signed)
Phlebotomy made aware patient in room and ready to draw blood.

## 2012-10-25 NOTE — ED Notes (Signed)
Results of i-stat Troponin shown to Dr. Denton Lank

## 2012-10-25 NOTE — ED Notes (Signed)
Reports she has been having cough for several months ( no one else in home affected ) history of HBP, off medication for years, treating herself w apple cider vinegar , no MD supervision

## 2012-10-26 ENCOUNTER — Encounter (HOSPITAL_COMMUNITY): Payer: Self-pay | Admitting: Internal Medicine

## 2012-10-26 DIAGNOSIS — J9600 Acute respiratory failure, unspecified whether with hypoxia or hypercapnia: Secondary | ICD-10-CM | POA: Diagnosis present

## 2012-10-26 DIAGNOSIS — D259 Leiomyoma of uterus, unspecified: Secondary | ICD-10-CM | POA: Diagnosis present

## 2012-10-26 DIAGNOSIS — R946 Abnormal results of thyroid function studies: Secondary | ICD-10-CM | POA: Diagnosis present

## 2012-10-26 LAB — TROPONIN I
Troponin I: <0.3 ng/mL (ref <0.30)
Troponin I: 0.44 ng/mL (ref <0.30)
Troponin I: 0.33 ng/mL (ref <0.30)

## 2012-10-26 LAB — LIPID PANEL
Cholesterol: 177 mg/dL (ref 0–200)
HDL: 41 mg/dL (ref >39)
LDL Cholesterol: 113 mg/dL — ABNORMAL HIGH (ref 0–99)
Total CHOL/HDL Ratio: 4.3 RATIO
Triglycerides: 115 mg/dL (ref <150)
VLDL: 23 mg/dL (ref 0–40)

## 2012-10-26 LAB — IRON AND TIBC
Iron: 17 ug/dL — ABNORMAL LOW (ref 42–135)
Saturation Ratios: 3 % — ABNORMAL LOW (ref 20–55)
TIBC: 491 ug/dL — ABNORMAL HIGH (ref 250–470)
UIBC: 474 ug/dL — ABNORMAL HIGH (ref 125–400)

## 2012-10-26 LAB — TYPE AND SCREEN
ABO/RH(D): A POS
Antibody Screen: NEGATIVE
DAT, IgG: NEGATIVE
Unit division: 0
Unit division: 0

## 2012-10-26 LAB — CK TOTAL AND CKMB (NOT AT ARMC)
CK, MB: 7.1 ng/mL (ref 0.3–4.0)
CK, MB: 7.1 ng/mL (ref 0.3–4.0)
Relative Index: 2.6 — ABNORMAL HIGH (ref 0.0–2.5)
Relative Index: 2.4 (ref 0.0–2.5)
Total CK: 274 U/L — ABNORMAL HIGH (ref 7–177)
Total CK: 292 U/L — ABNORMAL HIGH (ref 7–177)

## 2012-10-26 LAB — HEMOGLOBIN AND HEMATOCRIT, BLOOD
HCT: 32.7 % — ABNORMAL LOW (ref 36.0–46.0)
Hemoglobin: 10 g/dL — ABNORMAL LOW (ref 12.0–15.0)

## 2012-10-26 LAB — FOLATE: Folate: 16.9 ng/mL

## 2012-10-26 LAB — TSH: TSH: 38.232 u[IU]/mL — ABNORMAL HIGH (ref 0.350–4.500)

## 2012-10-26 LAB — BASIC METABOLIC PANEL
BUN: 11 mg/dL (ref 6–23)
CO2: 24 mEq/L (ref 19–32)
Calcium: 9 mg/dL (ref 8.4–10.5)
Chloride: 103 mEq/L (ref 96–112)
Creatinine, Ser: 0.69 mg/dL (ref 0.50–1.10)
GFR calc Af Amer: >90 mL/min (ref >90)
GFR calc non Af Amer: >90 mL/min (ref >90)
Glucose, Bld: 95 mg/dL (ref 70–99)
Potassium: 3.1 mEq/L — ABNORMAL LOW (ref 3.5–5.1)
Sodium: 140 mEq/L (ref 135–145)

## 2012-10-26 LAB — HAPTOGLOBIN: Haptoglobin: 223 mg/dL — ABNORMAL HIGH (ref 45–215)

## 2012-10-26 LAB — T4, FREE: Free T4: 0.95 ng/dL (ref 0.80–1.80)

## 2012-10-26 LAB — VITAMIN B12: Vitamin B-12: 546 pg/mL (ref 211–911)

## 2012-10-26 LAB — FERRITIN: Ferritin: 4 ng/mL — ABNORMAL LOW (ref 10–291)

## 2012-10-26 MED ORDER — POTASSIUM CHLORIDE CRYS ER 20 MEQ PO TBCR
40.0000 meq | EXTENDED_RELEASE_TABLET | Freq: Once | ORAL | Status: AC
  Administered 2012-10-26: 40 meq via ORAL
  Filled 2012-10-26: qty 2

## 2012-10-26 MED ORDER — SODIUM CHLORIDE 0.9 % IV SOLN
250.0000 mg | Freq: Once | INTRAVENOUS | Status: AC
  Administered 2012-10-26: 250 mg via INTRAVENOUS
  Filled 2012-10-26 (×2): qty 5

## 2012-10-26 MED ORDER — SODIUM CHLORIDE 0.9 % IV SOLN
25.0000 mg | Freq: Once | INTRAVENOUS | Status: AC
  Administered 2012-10-26: 25 mg via INTRAVENOUS
  Filled 2012-10-26: qty 0.5

## 2012-10-26 MED ORDER — FERROUS GLUCONATE 324 (38 FE) MG PO TABS
324.0000 mg | ORAL_TABLET | Freq: Two times a day (BID) | ORAL | Status: DC
  Administered 2012-10-27 – 2012-11-01 (×12): 324 mg via ORAL
  Filled 2012-10-26 (×15): qty 1

## 2012-10-26 MED ORDER — POTASSIUM CHLORIDE CRYS ER 20 MEQ PO TBCR
40.0000 meq | EXTENDED_RELEASE_TABLET | Freq: Every day | ORAL | Status: DC
  Administered 2012-10-27 – 2012-11-01 (×6): 40 meq via ORAL
  Filled 2012-10-26 (×6): qty 2

## 2012-10-26 MED ORDER — POTASSIUM CHLORIDE CRYS ER 20 MEQ PO TBCR: 40.0000 meq | EXTENDED_RELEASE_TABLET | Freq: Every day | ORAL | Status: DC

## 2012-10-26 MED ORDER — POTASSIUM CHLORIDE CRYS ER 20 MEQ PO TBCR: 40.0000 meq | EXTENDED_RELEASE_TABLET | Freq: Once | ORAL | Status: DC

## 2012-10-26 MED ORDER — POTASSIUM CHLORIDE CRYS ER 20 MEQ PO TBCR
40.0000 meq | EXTENDED_RELEASE_TABLET | Freq: Once | ORAL | Status: DC
Start: 2012-10-26 — End: 2012-10-26

## 2012-10-26 NOTE — Care Management Note (Signed)
   CARE MANAGEMENT NOTE 10/26/2012  Patient:  Miranda [name], Miranda [name]   Account Number:  [id number]  Date Initiated:  10/26/2012  Documentation initiated by:  Jayton Popelka  Subjective/Objective Assessment:   Consult for CHF screening.     Action/Plan:   3/25 CM will follow for d/c needs and discuss with pt when pt more stable.   Anticipated DC Date:     Anticipated DC Plan:  HOME W HOME HEALTH SERVICES         Choice offered to / List presented to:          [facility] arranged  HH-10 DISEASE MANAGEMENT  HH-1 RN      Status of service: In process. Medicare Important Message given?   (If response is "NO", the following Medicare IM given date fields will be blank) Date Medicare IM given:   Date Additional Medicare IM given:    Discharge Disposition:    Per UR Regulation:    If discussed at Long Length of Stay Meetings, dates discussed:    Comments:

## 2012-10-26 NOTE — Progress Notes (Signed)
Event: Notified by RN that pt c/o chest pain that radiates to her (L) shoulder. Pt denies other symptoms. 12-Lead EKG done. NP to bedside. Subjective: Pt reports onset of SSCP that radiated to her (L) shoulder. States the pain pain felt like a muscle cramp. The pain lasted approx 15-20 and has since resolved. Nothing seemed to make pain worse. Massaging her (L) shoulder seemed to make it better. Pt denied that she had any N/V, dizziness, SOB or other associated symptoms. Is currently pain free. Objective: Miranda [name] is a 47 year old female with history of hypertension (has not been taking medication for last 10 years) was sent from the [facility] on 10/25/12 after presenting there for worsening shortness of breath over the last 3 days especially w/ exertion. She had 2 pillow orthopnea and was unable to sleep the previous night due to SOB. She denied any chest pain. She has had history of hypertension but stated that she had kept it down herself and was not taking medications for last 10 years. Otherwise she denied any chest pain, any fever chills or productive cough. She had been coughing some due to congestion. She denied any abdominal pain nausea, vomiting or any hematemesis.  ED workup showed severe anemia with hemoglobin of 6.5, hematocrit 23.9, troponin positive at 0.1, elevated d-dimer at 2.62. In ED BP was 182/109, HR 11. CT angiogram chest revealed no PE but positive findings c/w CHF thought secondary to uncontrolled HTN related to non-compliance. Initial elevated Troponin's felt d/y uncontrolled BP and CHF vs ACS. 2-D Echo results pending. At bedside pt found resting quietly w/ eyes closed. Current VS, T- 98.9, BP-125/77, P-86, R-17 w/ 02 sats of 97% on 2L [place]. BBS somewhat diminished but otherwise CTA. No chest wall TTP. Pain is not reproducible. Most recent Troponin @ 2020 0.33,  trending down from 0.44. 12-Lead EKG shows SR w/ rate of 87, w/o acute changes and is comparable to EKG from 10/25/12.   Assessment/Plan: 1. Chest pain: (Resolved). ACS considered though some MS component as well. EKG w/o acute changes. VSS, no associated symptoms. PE unremarkable. Will repeat Trop I with AM labs. Will defer any further changes in pt's plan of care to rounding MD. Discussed pt w/ Dr Joneen Roach who has also reviewed EKG and is in agreement w/ plan. Will continue to monitor closely.   Leanne Chang, NP-C [facility] Pager [phone]

## 2012-10-26 NOTE — Progress Notes (Signed)
IV iron started per MD order. Pt tolerated small first dose well, second dose started. At approximately 2230 pt sitting on side of bed, complaining of 7/10 chest pain that is doing down her left arm. Ekg obtained and NP Schorr notified. Pt VSS at this time. Will continue to monitor.

## 2012-10-26 NOTE — Progress Notes (Signed)
[facility] Progress Note [place] TEAM 1 - Stepdown/ICU TEAM   Miranda [name] DA:[id number] DOB: Sep 14, 1965 DOA: 10/25/2012 PCP: No primary provider on file.  Brief narrative: Patient is a 47 year old female with history of hypertension (has not been taking medication for last 10 years) was sent from the [facility] urgent care facility for worsening shortness of breath. History was obtained from the patient who stated that she had noticed increased shortness of breath with exertion for last 3 days since Friday. She had 2 pillow orthopnea and was unable to sleep last night due to shortness of breath. She denied any chest pain. She has had history of hypertension but stated that she had kept it down herself and was not taking medications for last 10 years. Otherwise she denied any chest pain, any fever chills or productive cough. She had been coughing some due to congestion. She denied any abdominal pain nausea, vomiting or any hematemesis.  ED workup showed severe anemia with hemoglobin of 6.5, hematocrit 23.9, troponin positive at 0.1, elevated d-dimer at 2.62. At the time of triage BP was 182/109, HR 111, at the time of my encounter, HR is in 120-130's, sinus tachycardia, BP 171/101. CT angiogram chest has been done but the results are pending.  Patient also reported that she has irregular menstrual periods, last month had about 9-10 days long period with clots, today she had "little". Patient also has not seen a PCP or GYN MD in years.    Assessment/Plan: Principal Problem:   Acute respiratory failure -Secondary to congestive heart failure from uncontrolled blood pressure compounded by anemia -Improving-continue Lasix at current dose -Await echo result  Active Problems:   Anemia-microcytic -Secondary to chronic blood loss from fibroid uterus -Has been transfused appropriately -Will need iron replacement as well    Elevated troponin -Due to uncontrolled blood pressure and congestive  heart failure rather than acute coronary syndrome    Accelerated hypertension -Currently BP is much better controlled    CHF, acute -As mentioned above continue Lasix and follow up on echo    Uterine fibroid -Will need outpatient followup with OB/GYN    Abnormal thyroid function test-likely sick euthyroid -Free T4 is normal-patient does not suggest any symptoms of hypothyroidism  Hypokalemia Continue to replace aggressively as she is on Lasix    Code Status: Full code Family Communication: None  Disposition Plan: Obtain her follow in step down for today  Consultants: None  Procedures: None  Antibiotics: None  DVT prophylaxis: SCDs  HPI/Subjective: Patient is breathing much better today than she was yesterday. No complaints of cough or chest pain. I have explained that her blood pressure needs aggressive control and that she likely has structural cardiac changes resulting in congestive heart failure in relation to prolonged uncontrolled blood pressure. She understands that we are awaiting an echo result further comment on this. In addition I've explained that she has a large uterine fibroid which is what is contributing to her anemia and will need to be followed up by OB/GYN with probable hysterectomy. In addition I've explained that she is severely iron deficient which will need to be replaced. I questioned her in regards to hypothyroid symptoms. She does admit to some dry hair and dry nails but otherwise is asymptomatic.   Objective: Blood pressure 125/93, pulse 97, temperature 98 F (36.7 C), temperature source Oral, resp. rate 14, height '5\' 4"'$  (1.626 m), weight 55.8 kg (123 lb 0.3 oz), SpO2 99.00%.  Intake/Output Summary (Last 24 hours) at 10/26/12  1907 Last data filed at 10/26/12 1800  Gross per 24 hour  Intake   1260 ml  Output   2700 ml  Net  -1440 ml     Exam: General: No acute respiratory distress Lungs: Clear to auscultation bilaterally without wheezes  or crackles Cardiovascular: Regular rate and rhythm without murmur gallop or rub normal S1 and S2 Abdomen: Nontender, nondistended, soft, bowel sounds positive, no rebound, no ascites, no appreciable mass Extremities: No significant cyanosis, clubbing, or edema bilateral lower extremities  Data Reviewed: Basic Metabolic Panel:  Recent Labs Lab 10/25/12 1306 10/26/12 0518  NA 138 140  K 3.6 3.1*  CL 105 103  CO2 21 24  GLUCOSE 93 95  BUN 10 11  CREATININE 0.56 0.69  CALCIUM 9.0 9.0   Liver Function Tests:  Recent Labs Lab 10/25/12 1306  AST 34  ALT 13  ALKPHOS 72  BILITOT 0.2*  PROT 7.7  ALBUMIN 2.9*   No results found for this basename: LIPASE, AMYLASE,  in the last 168 hours No results found for this basename: AMMONIA,  in the last 168 hours CBC:  Recent Labs Lab 10/25/12 1306 10/26/12 0518  WBC 6.3  --   NEUTROABS 4.6  --   HGB 6.5* 10.0*  HCT 23.9* 32.7*  MCV 63.4*  --   PLT 223  --    Cardiac Enzymes:  Recent Labs Lab 10/25/12 1606 10/25/12 1748 10/26/12 0518 10/26/12 1006  CKTOTAL  --  364* 274* 292*  CKMB  --  10.0* 7.1* 7.1*  TROPONINI <0.30 <0.30 <0.30 0.44*   BNP (last 3 results)  Recent Labs  10/25/12 1306  PROBNP 3831.0*   CBG: No results found for this basename: GLUCAP,  in the last 168 hours  Recent Results (from the past 240 hour(s))  MRSA PCR SCREENING     Status: None   Collection Time    10/25/12  5:34 PM      Result Value Range Status   MRSA by PCR NEGATIVE  NEGATIVE Final   Comment:            The GeneXpert MRSA Assay (FDA     approved for NASAL specimens     only), is one component of a     comprehensive MRSA colonization     surveillance program. It is not     intended to diagnose MRSA     infection nor to guide or     monitor treatment for     MRSA infections.     Studies:  Recent x-ray studies have been reviewed in detail by the Attending Physician  Scheduled Meds:  Scheduled Meds: . carvedilol   3.125 mg Oral BID WC  . furosemide  40 mg Intravenous Daily  . [START ON 10/27/2012] potassium chloride  40 mEq Oral Daily  . sodium chloride  3 mL Intravenous Q12H   Continuous Infusions:   Time spent on care of this patient: 35 minutes  [place]  [phone] Pager - Text Page per Shea Evans as per below:  On-Call/Text Page:      [email or web address]      password [email or web address]  If 7PM-7AM, please contact night-coverage [email or web address] Password [email or web address] 10/26/2012, 7:07 PM   LOS: 1 day

## 2012-10-26 NOTE — Progress Notes (Signed)
PT Cancellation Note  Patient Details Name: Lakresha Stifter MRN: [id number] DOB: 18-Jan-1966   Cancelled Treatment:    Reason Eval/Treat Not Completed: Medical issues which prohibited therapy (Pt with elevated CKMB.)   Shaquanna Lycan 10/26/2012, 9:20 AM

## 2012-10-26 NOTE — Progress Notes (Signed)
  Echocardiogram 2D Echocardiogram has been performed.  Miranda [name] 10/26/2012, 4:42 PM

## 2012-10-27 LAB — TROPONIN I: Troponin I: 0.46 ng/mL (ref <0.30)

## 2012-10-27 LAB — BASIC METABOLIC PANEL
BUN: 16 mg/dL (ref 6–23)
CO2: 25 mEq/L (ref 19–32)
Calcium: 9.2 mg/dL (ref 8.4–10.5)
Chloride: 102 mEq/L (ref 96–112)
Creatinine, Ser: 0.74 mg/dL (ref 0.50–1.10)
GFR calc Af Amer: >90 mL/min (ref >90)
GFR calc non Af Amer: >90 mL/min (ref >90)
Glucose, Bld: 95 mg/dL (ref 70–99)
Potassium: 3.3 mEq/L — ABNORMAL LOW (ref 3.5–5.1)
Sodium: 138 mEq/L (ref 135–145)

## 2012-10-27 LAB — CBC
HCT: 32.8 % — ABNORMAL LOW (ref 36.0–46.0)
Hemoglobin: 9.6 g/dL — ABNORMAL LOW (ref 12.0–15.0)
MCH: 20.2 pg — ABNORMAL LOW (ref 26.0–34.0)
MCHC: 29.3 g/dL — ABNORMAL LOW (ref 30.0–36.0)
MCV: 68.9 fL — ABNORMAL LOW (ref 78.0–100.0)
Platelets: 194 10*3/uL (ref 150–400)
RBC: 4.76 MIL/uL (ref 3.87–5.11)
RDW: 25 % — ABNORMAL HIGH (ref 11.5–15.5)
WBC: 9.7 10*3/uL (ref 4.0–10.5)

## 2012-10-27 LAB — GLUCOSE, CAPILLARY: Glucose-Capillary: 126 mg/dL — ABNORMAL HIGH (ref 70–99)

## 2012-10-27 MED ORDER — POTASSIUM CHLORIDE CRYS ER 20 MEQ PO TBCR
40.0000 meq | EXTENDED_RELEASE_TABLET | Freq: Once | ORAL | Status: AC
  Administered 2012-10-27: 40 meq via ORAL
  Filled 2012-10-27: qty 2

## 2012-10-27 MED ORDER — FUROSEMIDE 40 MG PO TABS
40.0000 mg | ORAL_TABLET | Freq: Every day | ORAL | Status: DC
  Filled 2012-10-27: qty 1

## 2012-10-27 MED ORDER — FUROSEMIDE 40 MG PO TABS
40.0000 mg | ORAL_TABLET | Freq: Every day | ORAL | Status: DC
  Administered 2012-10-28: 40 mg via ORAL
  Filled 2012-10-27 (×3): qty 1

## 2012-10-27 NOTE — Plan of Care (Signed)
Problem: Food- and Nutrition-Related Knowledge Deficit (NB-1.1) Goal: Nutrition education Formal process to instruct or train a patient/client in a skill or to impart knowledge to help patients/clients voluntarily manage or modify food choices and eating behavior to maintain or improve health. Outcome: Completed/Met Date Met:  10/27/12 Nutrition Education Note  RN requested RD see patient for nutrition education regarding new onset CHF.  RD provided "Low Sodium Nutrition Therapy" handout from the Academy of [employer]etics. Reviewed patient's dietary recall. Provided examples on ways to decrease sodium intake in diet. Discouraged intake of processed foods and use of salt shaker. Encouraged fresh fruits and vegetables as well as whole grain sources of carbohydrates to maximize fiber intake.   RD discussed why it is important for patient to adhere to diet recommendations, and emphasized the role of fluids, foods to avoid, and importance of weighing self daily. Teach back method used.  Expect good compliance.  Body mass index is 21.18 kg/(m^2). Pt meets criteria for normal weight based on current BMI.  Current diet order is Heart Healthy, patient reports that she is eating well. Labs and medications reviewed. No further nutrition interventions warranted at this time. RD contact information provided. If additional nutrition issues arise, please re-consult RD.   Joaquin Courts, RD, LDN, CNSC Pager# [phone] After Hours Pager# [phone]

## 2012-10-27 NOTE — Evaluation (Signed)
Physical Therapy Evaluation Patient Details Name: Miranda [name] MRN: [id number] DOB: 11-Apr-1966 Today's Date: 10/27/2012 Time: 4098-1191 PT Time Calculation (min): 23 min  PT Assessment / Plan / Recommendation Clinical Impression  Patient is a 47 y/o female admitted with SOB with anemia, acute CHF and uncontrlled HTN.  She presents to PT with decreased acitivity tolerance, decreased balance, decreased strength and will benefit from skilled PT in the actue setting to maximize independence and allow d/c home with son's assist.    PT Assessment  Patient needs continued PT services    Follow Up Recommendations  No PT follow up (due to pt reports has to go have hysterectomy due to fibroids)       Barriers to Discharge Inaccessible home environment      Equipment Recommendations  None recommended by PT       Frequency Min 3X/week    Precautions / Restrictions Precautions Precautions: Fall   Pertinent Vitals/Pain Denies pain      Mobility  Bed Mobility Bed Mobility: Supine to Sit Supine to Sit: 6: Modified independent (Device/Increase time) Transfers Transfers: Sit to Stand;Stand to Sit Sit to Stand: 4: Min guard;From bed Stand to Sit: 4: Min guard;To bed Details for Transfer Assistance: self selected technique with UE assist, able to perform without UE assist with supervision Ambulation/Gait Ambulation/Gait Assistance: 4: Min guard Ambulation Distance (Feet): 800 Feet Assistive device: None Ambulation/Gait Assistance Details: mild loss of balance with minguard assist for safety throughout.  Demonstrates poor postural strength with head flexed and shoulders slumped forward Gait Pattern: Step-through pattern;Decreased stride length;Decreased hip/knee flexion - left;Decreased hip/knee flexion - right    Exercises General Exercises - Lower Extremity Hip ABduction/ADduction: Strengthening;Both;10 reps;Standing Hip Flexion/Marching: 10 reps;Both;Standing;Strengthening Toe  Raises: Both Heel Raises: Standing;10 reps;Strengthening Other Exercises Other Exercises: sit<>stand without UE support x 5 reps Other Exercises: seated postural strengthening with scapular squeezes x 10 reps 5 sec hold   PT Diagnosis: Abnormality of gait;Generalized weakness  PT Problem List: Decreased strength;Decreased activity tolerance;Decreased balance;Decreased mobility;Decreased knowledge of use of DME PT Treatment Interventions: DME instruction;Gait training;Stair training;Functional mobility training;Therapeutic activities;Patient/family education;Balance training   PT Goals Acute Rehab PT Goals PT Goal Formulation: With patient Time For Goal Achievement: 11/09/12 Potential to Achieve Goals: Good Pt will go Stand to Sit: Independently PT Goal: Stand to Sit - Progress: Goal set today Pt will Stand: Independently PT Goal: Stand - Progress: Goal set today Pt will Ambulate: >150 feet;Independently PT Goal: Ambulate - Progress: Goal set today Pt will Go Up / Down Stairs: Flight;with rail(s);with supervision PT Goal: Up/Down Stairs - Progress: Goal set today Pt will Perform Home Exercise Program: Independently PT Goal: Perform Home Exercise Program - Progress: Goal set today  Visit Information  Last PT Received On: 10/27/12 Assistance Needed: +1    Subjective Data  Subjective: Feel okay today.  Back sore sitting up in bed. Patient Stated Goal: to return home with son's help   Prior Functioning  Home Living Lives With: Son Available Help at Discharge: Family;Available PRN/intermittently Type of Home: House Home Access: Level entry Home Layout: Two level;Bed/bath upstairs Alternate Level Stairs-Number of Steps: flight Alternate Level Stairs-Rails: Right Home Adaptive Equipment: None Prior Function Level of Independence: Independent Able to Take Stairs?: Yes Driving: Yes Communication Communication: No difficulties    Cognition  Cognition Overall Cognitive  Status: Appears within functional limits for tasks assessed/performed Arousal/Alertness: Awake/alert Orientation Level: Appears intact for tasks assessed Behavior During Session: [facility] for tasks performed    Extremity/Trunk  Assessment Right Lower Extremity Assessment RLE ROM/Strength/Tone: Deficits RLE ROM/Strength/Tone Deficits: grossly 4/5 throughout RLE Sensation: WFL - Light Touch Left Lower Extremity Assessment LLE ROM/Strength/Tone: Deficits LLE ROM/Strength/Tone Deficits: grossly 4/5 throughout LLE Sensation: WFL - Light Touch   Balance Balance Balance Assessed: Yes Static Standing Balance Static Standing - Balance Support: No upper extremity supported Static Standing - Level of Assistance: 5: Stand by assistance Static Standing - Comment/# of Minutes: standing performing LE strength and balance activities  End of Session PT - End of Session Equipment Utilized During Treatment: Gait belt Activity Tolerance: Patient tolerated treatment well Patient left: in bed;with call bell/phone within reach;with nursing in room  GP     [facility] 10/27/2012, 3:27 PM Sheran Lawless, PT [phone] 10/27/2012

## 2012-10-27 NOTE — Progress Notes (Signed)
Report called, pt transferring to [id number] via w/c with belongings. Miranda [name]

## 2012-10-27 NOTE — Progress Notes (Signed)
[facility] Progress Note [place] TEAM 1 - Stepdown/ICU TEAM   [name] YQM:[id number] DOB: 1966-03-22 DOA: 10/25/2012 PCP: No primary provider on file.  Brief narrative: 47 year old female with history of hypertension (has not been taking medication for last 10 years) was sent from the [facility] for worsening shortness of breath. History was obtained from the patient who stated that she had noticed increased shortness of breath with exertion for 3 days. She had 2 pillow orthopnea and was unable to sleep due to shortness of breath. Otherwise she denied any chest pain, any fever chills or productive cough.  She denied any abdominal pain nausea, vomiting or any hematemesis.  ED workup showed severe anemia with hemoglobin of 6.5, hematocrit 23.9, troponin positive at 0.1, elevated d-dimer at 2.62. At the time of triage BP was 182/109, HR 111.  Patient also reported that she has irregular menstrual periods, about 9-10 day long periods with large clots. Patient also had not seen a PCP or GYN MD in years.   Assessment/Plan:  Acute respiratory failure -Secondary to congestive heart failure from uncontrolled blood pressure compounded by anemia -Improving - continue Lasix at current dose -CTA of chest w/o evidence of PE  Microcytic Fe deficient anemia -Secondary to chronic blood loss from fibroid uterus -Hgb nadir of 6.5 -Has been transfused appropriately -s/p IV Fe tx  Mildly elevated troponin - deeply inverted T waves on EKG -likely due to uncontrolled blood pressure and congestive heart failure/LVH rather than acute coronary syndrome -recheck EKG w/o signif change today -pt denies chest pain or suggestion of anginal equivalent sx -if echo reveals focal WMA will need Cards eval for risk stratification   Accelerated hypertension -BP is much better controlled - no change in tx plan today  CHF, acute - awaiting further quantifying/qualifying information  -As mentioned  above continue Lasix and follow up on echo  Uterine fibroid -Will need outpatient followup with OB/GYN  Abnormal thyroid function test - likely sick euthyroid -Free T4 is normal - patient does not report any symptoms of hypothyroidism  Hypokalemia Continue to replace    Code Status: Full code Family Communication: None  Disposition Plan: Transfer to telemetry bed and  Consultants: None  Procedures: 3/25 - TTE >> results pending  Antibiotics: None  DVT prophylaxis: SCDs  HPI/Subjective: The patient feels much better overall today.  She denies chest pain current shortness of breath fevers chills nausea or vomiting.  She has not yet been up and out of bed to any significant extent.  She has no specific questions for me today.  Objective: Blood pressure 118/87, pulse 95, temperature 98.5 F (36.9 C), temperature source Oral, resp. rate 18, height 5\' 4"  (1.626 m), weight 56 kg (123 lb 7.3 oz), SpO2 94.00%.  Intake/Output Summary (Last 24 hours) at 10/27/12 1242 Last data filed at 10/27/12 1100  Gross per 24 hour  Intake    750 ml  Output   1425 ml  Net   -675 ml    Exam: General: No acute respiratory distress at rest  Lungs: Clear to auscultation bilaterally without wheezes or crackles Cardiovascular: Regular rate and rhythm without murmur gallop or rub normal S1 and S2 Abdomen: Nontender, nondistended, soft, bowel sounds positive, no rebound, no ascites, no appreciable mass Extremities: No significant cyanosis, clubbing, or edema bilateral lower extremities  Data Reviewed: Basic Metabolic Panel:  Recent Labs Lab 10/25/12 1306 10/26/12 0518 10/27/12 0410  NA 138 140 138  K 3.6 3.1* 3.3*  CL  105 103 102  CO2 21 24 25   GLUCOSE 93 95 95  BUN 10 11 16   CREATININE 0.56 0.69 0.74  CALCIUM 9.0 9.0 9.2   Liver Function Tests:  Recent Labs Lab 10/25/12 1306  AST 34  ALT 13  ALKPHOS 72  BILITOT 0.2*  PROT 7.7  ALBUMIN 2.9*   CBC:  Recent Labs Lab  10/25/12 1306 10/26/12 0518 10/27/12 0410  WBC 6.3  --  9.7  NEUTROABS 4.6  --   --   HGB 6.5* 10.0* 9.6*  HCT 23.9* 32.7* 32.8*  MCV 63.4*  --  68.9*  PLT 223  --  194   Cardiac Enzymes:  Recent Labs Lab 10/25/12 1606 10/25/12 1748 10/26/12 0518 10/26/12 1006 10/26/12 2023 10/27/12 0410  CKTOTAL  --  364* 274* 292*  --   --   CKMB  --  10.0* 7.1* 7.1*  --   --   TROPONINI <0.30 <0.30 <0.30 0.44* 0.33* 0.46*   BNP (last 3 results)  Recent Labs  10/25/12 1306  PROBNP 3831.0*     Recent Results (from the past 240 hour(s))  MRSA PCR SCREENING     Status: None   Collection Time    10/25/12  5:34 PM      Result Value Range Status   MRSA by PCR NEGATIVE  NEGATIVE Final   Comment:            The GeneXpert MRSA Assay (FDA     approved for NASAL specimens     only), is one component of a     comprehensive MRSA colonization     surveillance program. It is not     intended to diagnose MRSA     infection nor to guide or     monitor treatment for     MRSA infections.     Studies:  Recent x-ray studies have been reviewed in detail by the Attending Physician  Scheduled Meds:  Scheduled Meds: . carvedilol  3.125 mg Oral BID WC  . ferrous gluconate  324 mg Oral BID WC  . furosemide  40 mg Intravenous Daily  . potassium chloride  40 mEq Oral Daily  . sodium chloride  3 mL Intravenous Q12H   Continuous Infusions:   Time spent on care of this patient: 35 minutes  [facility] T  [facility] Office  [phone] Pager - Text Page per Loretha Stapler as per below:  On-Call/Text Page:      [email or web address]      password [email or web address]  If 7PM-7AM, please contact night-coverage [email or web address] Password [facility] 10/27/2012, 12:42 PM   LOS: 2 days

## 2012-10-28 DIAGNOSIS — I5042 Chronic combined systolic (congestive) and diastolic (congestive) heart failure: Secondary | ICD-10-CM | POA: Diagnosis present

## 2012-10-28 DIAGNOSIS — I5043 Acute on chronic combined systolic (congestive) and diastolic (congestive) heart failure: Secondary | ICD-10-CM

## 2012-10-28 DIAGNOSIS — I509 Heart failure, unspecified: Secondary | ICD-10-CM

## 2012-10-28 HISTORY — DX: Heart failure, unspecified: I50.9

## 2012-10-28 LAB — BASIC METABOLIC PANEL
BUN: 19 mg/dL (ref 6–23)
CO2: 24 mEq/L (ref 19–32)
Calcium: 9.4 mg/dL (ref 8.4–10.5)
Chloride: 102 mEq/L (ref 96–112)
Creatinine, Ser: 0.67 mg/dL (ref 0.50–1.10)
GFR calc Af Amer: >90 mL/min (ref >90)
GFR calc non Af Amer: >90 mL/min (ref >90)
Glucose, Bld: 80 mg/dL (ref 70–99)
Potassium: 4.1 mEq/L (ref 3.5–5.1)
Sodium: 136 mEq/L (ref 135–145)

## 2012-10-28 LAB — HEMOGLOBIN A1C
Hgb A1c MFr Bld: 5.6 % (ref <5.7)
Mean Plasma Glucose: 114 mg/dL (ref <117)

## 2012-10-28 LAB — PRO B NATRIURETIC PEPTIDE: Pro B Natriuretic peptide (BNP): 1028 pg/mL — ABNORMAL HIGH (ref 0–125)

## 2012-10-28 LAB — TROPONIN I: Troponin I: 0.38 ng/mL (ref <0.30)

## 2012-10-28 MED ORDER — SODIUM CHLORIDE 0.9 % IV SOLN: 250.0000 mL | INTRAVENOUS | Status: DC | PRN

## 2012-10-28 MED ORDER — ASPIRIN 81 MG PO CHEW
324.0000 mg | CHEWABLE_TABLET | ORAL | Status: AC
  Administered 2012-10-29: 324 mg via ORAL
  Filled 2012-10-28: qty 4

## 2012-10-28 MED ORDER — ASPIRIN EC 81 MG PO TBEC
81.0000 mg | DELAYED_RELEASE_TABLET | Freq: Every day | ORAL | Status: DC
  Administered 2012-10-28 – 2012-11-01 (×4): 81 mg via ORAL
  Filled 2012-10-28 (×5): qty 1

## 2012-10-28 MED ORDER — SODIUM CHLORIDE 0.9 % IJ SOLN
3.0000 mL | Freq: Two times a day (BID) | INTRAMUSCULAR | Status: DC
  Administered 2012-10-28 – 2012-10-29 (×2): 3 mL via INTRAVENOUS

## 2012-10-28 MED ORDER — FUROSEMIDE 10 MG/ML IJ SOLN
INTRAMUSCULAR | Status: AC
  Filled 2012-10-28: qty 8

## 2012-10-28 MED ORDER — SODIUM CHLORIDE 0.9 % IJ SOLN: 3.0000 mL | INTRAMUSCULAR | Status: DC | PRN

## 2012-10-28 MED ORDER — CARVEDILOL 6.25 MG PO TABS
6.2500 mg | ORAL_TABLET | Freq: Two times a day (BID) | ORAL | Status: DC
  Administered 2012-10-28 – 2012-10-29 (×3): 6.25 mg via ORAL
  Filled 2012-10-28 (×4): qty 1

## 2012-10-28 MED ORDER — FUROSEMIDE 10 MG/ML IJ SOLN: 10.0000 mg | Freq: Once | INTRAMUSCULAR | Status: DC

## 2012-10-28 MED ORDER — LISINOPRIL 5 MG PO TABS
5.0000 mg | ORAL_TABLET | Freq: Every day | ORAL | Status: DC
  Administered 2012-10-28: 5 mg via ORAL
  Filled 2012-10-28 (×2): qty 1

## 2012-10-28 MED ORDER — ATORVASTATIN CALCIUM 10 MG PO TABS
10.0000 mg | ORAL_TABLET | Freq: Every day | ORAL | Status: DC
  Administered 2012-10-28 – 2012-11-01 (×5): 10 mg via ORAL
  Filled 2012-10-28 (×5): qty 1

## 2012-10-28 NOTE — Progress Notes (Signed)
CARDIAC REHAB PHASE I   PRE:  Rate/Rhythm: 87SR  BP:  Supine: 120/81  Sitting:   Standing:    SaO2: 99%RA  MODE:  Ambulation: 760 ft   POST:  Rate/Rhythm: 87  BP:  Supine:   Sitting: 127/85  Standing:    SaO2: 98%RA 1335-1430 Pt walked 760 ft with steady gait and tolerated well without SOB. Denied CP. Gave pt CHF booklet and reviewed the zones with her. Discussed smoking cessation. Pt states she is quitting cold [place]. Encouraged pt to watch CHF video later.    Luetta Nutting, RN BSN  10/28/2012 2:25 PM

## 2012-10-28 NOTE — Consult Note (Signed)
Pt. Seen and examined. Agree with the NP/PA-C note as written. 47 yo female with a history of uncontrolled HTN (apparently she saw Dr. Shana Chute years ago and was on anti-hypertensive medications, but discontinued taking them about 10 years ago), tobacco abuse, dyslipidemia (not on medications), and anemia. Family history of resistant hypertension in mother and grandmother. She presented after weeks of increasing shortness of breath, fatigue, orthopnea and PND. Found to be in CHF and echo reveals a moderately dilated LV with global hypokinesis and EF of 15-20%.  She has not had chest pain and my suspicion is that her cardiomyopathy is secondary to "burned-out" hypertension. There was a small posterior pericardial effusion.   I discussed further management with her today and feel she needs a definitive coronary assessment and measurement of CO and right heart pressures. Would recommend [facility] tomorrow which she is agreeable to.  I think we can work to [employer] her coreg and she appears compensated at this point, but remains tachycardic. Will increase to 6.25 mg BID.  She is on lisinopril 5 mg daily and low dose aspirin as well as lasix 40 mg daily. She would be appropriate for LifeVest before discharge for primary prevention of SCD.   Chrystie Nose, MD, [facility] Attending Cardiologist [facility]

## 2012-10-28 NOTE — Consult Note (Signed)
Reason for Consult: CHF Referring Physician: Dr. Ardyth Harps, [facility]   HPI: The patient is a 47 y.o. female with no established PCP. She has never seen a cardiologist. Her history is significant for hypertension, but was not on any antihypertensives at home. She presented to [facility], from an urgent care facility, on 3/24 with a complaint of increased SOB x 3 days.  At the time of presentation, she had also endorsed 2 pillow orthopnea + mild lower extremity edema. She denied chest pain.  ED work-up revealed severe anemia with a H/H of 6.5/23.9.  Her anemia is contributed to recent menorrhagia, secondary to uterine fibroids She was transfused with 2 units of blood. She also had an elevated d-dimer at 2.26, was in sinus tach with HRs in the 120-130s.  A CT angio of the chest ruled out PE. She was noted to have cardiomegaly and changes of CHF with bilateral pleural effusions.  There was also a small pericardial effusion. No evidence of pneumonia. BNP was elevated at 3,831. She had elevated troponin levels with a peak of 0.46.  Serial EKGs suggested LVH, but showed no acute changes.  Her SBPs in the ED were in the 170's -180's.  She was admitted by [facility] and started on a CHF protocol. She was placed on IV Lasix, Coreg, Lisinopril and ASA. A 2D echo was obtained which revealed severe LV dysfunction. EF was estimated at 15-20%.  There was diffuse hypokinesis. Grade 2 diastolic dysfunction, mild MR, moderate-severely dilatated LA. A small pericardial effusion circumferential to the heart was confirmed, without evidence of hemodynamic compromise.   On hospital day 3, the patient reports improvement in SOB. She continues to deny chest pain. She has had intermittent palpitations, but denies lightheadedness, dizziness, syncope/presyncope.     Past Medical History  Diagnosis Date  . Hypertension     History reviewed. No pertinent past surgical history.  History reviewed. No pertinent family history.  Social History:   reports that she quit smoking 4 days ago. Her smoking use included Cigars. She has never used smokeless tobacco. Her alcohol and drug histories are not on file.  Allergies:  Allergies  Allergen Reactions  . Codeine Hives    Medications:  Prior to Admission:  Prescriptions prior to admission  Medication Sig Dispense Refill  . oxymetazoline (AFRIN) 0.05 % nasal spray Place 2 sprays into the nose 2 (two) times daily.      . Phenol-Glycerin (VICKS 44 SORE THROAT MT) Take 15 mLs by mouth every 6 (six) hours as needed (cough).         Results for orders placed during the hospital encounter of 10/25/12 (from the past 48 hour(s))  TROPONIN I     Status: Abnormal   Collection Time    10/26/12  8:23 PM      Result Value Range   Troponin I 0.33 (*) <0.30 ng/mL   Comment:            Due to the release kinetics of cTnI,     a negative result within the first hours     of the onset of symptoms does not rule out     myocardial infarction with certainty.     If myocardial infarction is still suspected,     repeat the test at appropriate intervals.     CRITICAL VALUE NOTED.  VALUE IS CONSISTENT WITH PREVIOUSLY REPORTED AND CALLED VALUE.  BASIC METABOLIC PANEL     Status: Abnormal   Collection Time  10/27/12  4:10 AM      Result Value Range   Sodium 138  135 - 145 mEq/L   Potassium 3.3 (*) 3.5 - 5.1 mEq/L   Chloride 102  96 - 112 mEq/L   CO2 25  19 - 32 mEq/L   Glucose, Bld 95  70 - 99 mg/dL   BUN 16  6 - 23 mg/dL   Creatinine, Ser 1.61  0.50 - 1.10 mg/dL   Calcium 9.2  8.4 - 09.6 mg/dL   GFR calc non Af Amer >90  >90 mL/min   GFR calc Af Amer >90  >90 mL/min   Comment:            The eGFR has been calculated     using the CKD EPI equation.     This calculation has not been     validated in all clinical     situations.     eGFR's persistently     <90 mL/min signify     possible Chronic Kidney Disease.  CBC     Status: Abnormal   Collection Time    10/27/12  4:10 AM      Result  Value Range   WBC 9.7  4.0 - 10.5 K/uL   RBC 4.76  3.87 - 5.11 MIL/uL   Hemoglobin 9.6 (*) 12.0 - 15.0 g/dL   HCT 04.5 (*) 40.9 - 81.1 %   MCV 68.9 (*) 78.0 - 100.0 fL   Comment: POST TRANSFUSION SPECIMEN   MCH 20.2 (*) 26.0 - 34.0 pg   MCHC 29.3 (*) 30.0 - 36.0 g/dL   RDW 91.4 (*) 78.2 - 95.6 %   Platelets 194  150 - 400 K/uL   Comment: PLATELET COUNT CONFIRMED BY SMEAR     REPEATED TO VERIFY  TROPONIN I     Status: Abnormal   Collection Time    10/27/12  4:10 AM      Result Value Range   Troponin I 0.46 (*) <0.30 ng/mL   Comment:            Due to the release kinetics of cTnI,     a negative result within the first hours     of the onset of symptoms does not rule out     myocardial infarction with certainty.     If myocardial infarction is still suspected,     repeat the test at appropriate intervals.     CRITICAL VALUE NOTED.  VALUE IS CONSISTENT WITH PREVIOUSLY REPORTED AND CALLED VALUE.  GLUCOSE, CAPILLARY     Status: Abnormal   Collection Time    10/27/12  9:21 PM      Result Value Range   Glucose-Capillary 126 (*) 70 - 99 mg/dL  TROPONIN I     Status: Abnormal   Collection Time    10/28/12  4:40 AM      Result Value Range   Troponin I 0.38 (*) <0.30 ng/mL   Comment:            Due to the release kinetics of cTnI,     a negative result within the first hours     of the onset of symptoms does not rule out     myocardial infarction with certainty.     If myocardial infarction is still suspected,     repeat the test at appropriate intervals.     CRITICAL VALUE NOTED.  VALUE IS CONSISTENT WITH PREVIOUSLY REPORTED AND CALLED VALUE.  BASIC METABOLIC  PANEL     Status: None   Collection Time    10/28/12  4:40 AM      Result Value Range   Sodium 136  135 - 145 mEq/L   Potassium 4.1  3.5 - 5.1 mEq/L   Comment: DELTA CHECK NOTED   Chloride 102  96 - 112 mEq/L   CO2 24  19 - 32 mEq/L   Glucose, Bld 80  70 - 99 mg/dL   BUN 19  6 - 23 mg/dL   Creatinine, Ser 1.61  0.50  - 1.10 mg/dL   Calcium 9.4  8.4 - 09.6 mg/dL   GFR calc non Af Amer >90  >90 mL/min   GFR calc Af Amer >90  >90 mL/min   Comment:            The eGFR has been calculated     using the CKD EPI equation.     This calculation has not been     validated in all clinical     situations.     eGFR's persistently     <90 mL/min signify     possible Chronic Kidney Disease.    No results found.  Review of Systems  Constitutional: Negative for fever, chills and diaphoresis.  Respiratory: Positive for shortness of breath.   Cardiovascular: Positive for palpitations, orthopnea and leg swelling. Negative for chest pain, claudication and PND.  Gastrointestinal: Positive for constipation. Negative for nausea, vomiting and abdominal pain.  Genitourinary: Negative for hematuria.  Neurological: Negative for dizziness and loss of consciousness.   Blood pressure 122/82, pulse 91, temperature 97.9 F (36.6 C), temperature source Oral, resp. rate 18, height 5\' 4"  (1.626 m), weight 118 lb 8 oz (53.751 kg), SpO2 99.00%. Physical Exam  Constitutional: She is oriented to person, place, and time. She appears well-developed and well-nourished. No distress.  HENT:  Head: Normocephalic and atraumatic.  Eyes: Conjunctivae and EOM are normal. Pupils are equal, round, and reactive to light.  Neck: Neck supple. No JVD present. No thyromegaly present.  Cardiovascular: Normal rate, regular rhythm, normal heart sounds and intact distal pulses.  Exam reveals no gallop and no friction rub.   No murmur heard. Respiratory: Effort normal. No respiratory distress. She has no wheezes. She has rales (mild bibasilar rales).  GI: Soft. Bowel sounds are normal. She exhibits no distension and no mass. There is no tenderness.  Musculoskeletal: Normal range of motion. She exhibits no edema.  Lymphadenopathy:    She has no cervical adenopathy.  Neurological: She is alert and oriented to person, place, and time.  Skin: Skin is  warm and dry. She is not diaphoretic.  Psychiatric: She has a normal mood and affect. Her behavior is normal.    Assessment/Plan: Principal Problem:   Acute respiratory failure Active Problems:   Anemia-microcytic   Elevated troponin   Accelerated hypertension   CHF, acute   Uterine fibroid   Abnormal thyroid function test   CHF - Combined Systolic + Diastolic Dysfunction. EF of 15-20% with Grade II diastolic dysfunction on echo 10/27/12  Plan: Pt has combined systolic + diastolic HF. Acute exacerbation appears to be improving. She has net fluid balance of -2.86L since admission. Will recheck BNP. Will also check Hgb A1c. Continue with current regimen of daily Lasix, BB, ACE-I. Also continue with statin and ASA. Her BP is better controlled with most recent BP of 122/82. HR is stable. Pt has not been endorsing CP, however troponin's have been positive. ? If  elevation is due to HTN + CHF vs ischemia. Pt does have TWI noted on EKG. There are no prior EKGs to compare. Will need ischemic evaluation. Consider NST. If suggestive of ischemia, will need diagnostic cardiac cath. Ischemia can also explain her severe systolic dysfunction, with an EF of 15-20%. With low EF, she is at risk for fatal arrhythmias/SCD.  If ischemia is not the cause, she will need a LifeVest prior to discharge. Will need to titrate up/ keep on medical regimen for 3 months, then will reassess systolic function with Echo to determine if she will need ICD placement.   Allayne Butcher, PA-C 10/28/2012, 12:11 PM

## 2012-10-28 NOTE — Progress Notes (Signed)
Physical Therapy Treatment Patient Details Name: Miranda [name] MRN: [id number] DOB: October 30, 1965 Today's Date: 10/28/2012 Time: 4098-1191 PT Time Calculation (min): 28 min  PT Assessment / Plan / Recommendation Comments on Treatment Session  Patient progressing with balance and gait and able to negotiate 10 steps without any noted increase shortness of breath.  Should be able to go home when medically stable without need for followup of PT    Follow Up Recommendations  No PT follow up           Equipment Recommendations  None recommended by PT       Frequency Min 3X/week   Plan Discharge plan remains appropriate    Precautions / Restrictions Precautions Precautions: Fall   Pertinent Vitals/Pain No pain    Mobility  Bed Mobility Supine to Sit: 6: Modified independent (Device/Increase time) Transfers Sit to Stand: 5: Supervision Stand to Sit: 5: Supervision;To bed Details for Transfer Assistance: supervision for safety Ambulation/Gait Ambulation/Gait Assistance: 4: Min guard;5: Supervision Ambulation Distance (Feet): 350 Feet Assistive device: None Ambulation/Gait Assistance Details: wide based with lateral deviation from straight path at times, no overt loss of balance Gait Pattern: Step-through pattern;Decreased stride length;Trunk flexed Stairs: Yes Stairs Assistance: 5: Supervision;4: Min guard Stair Management Technique: One rail Right;Alternating pattern;Forwards Number of Stairs: 10    Exercises General Exercises - Lower Extremity Hip Flexion/Marching: Strengthening;Both;10 reps;Standing Heel Raises: Both;10 reps;Standing;Strengthening Mini-Sqauts: Strengthening;Both;10 reps;Standing     PT Goals Acute Rehab PT Goals Pt will go Stand to Sit: Independently PT Goal: Stand to Sit - Progress: Progressing toward goal Pt will Stand: Independently PT Goal: Stand - Progress: Progressing toward goal Pt will Ambulate: >150 feet;Independently PT Goal: Ambulate -  Progress: Progressing toward goal Pt will Go Up / Down Stairs: Flight;with rail(s);with supervision PT Goal: Up/Down Stairs - Progress: Met Pt will Perform Home Exercise Program: Independently PT Goal: Perform Home Exercise Program - Progress: Progressing toward goal  Visit Information  Last PT Received On: 10/28/12    Subjective Data  Subjective: [employer] with someone else, feels good to walk   Cognition  Cognition Overall Cognitive Status: Appears within functional limits for tasks assessed/performed Orientation Level: Appears intact for tasks assessed Behavior During Session: [facility] for tasks performed    Balance  Static Standing Balance Static Standing - Balance Support: No upper extremity supported Static Standing - Level of Assistance: 5: Stand by assistance High Level Balance High Level Balance Activites: Side stepping;Backward walking;Other (comment) High Level Balance Comments: tandem gait  End of Session PT - End of Session Equipment Utilized During Treatment: Gait belt Activity Tolerance: Patient tolerated treatment well Patient left: in bed;with call bell/phone within reach   GP     [facility] 10/28/2012, 4:13 PM [place], [place] [phone] 10/28/2012

## 2012-10-28 NOTE — Progress Notes (Addendum)
[facility] Progress Note  TEAM 1 - Stepdown/ICU TEAM   Miranda [name] DA:[id number] DOB: 07/16/1966 DOA: 10/25/2012 PCP: No primary provider on file.  Brief narrative: 47 year old female with history of hypertension (has not been taking medication for last 10 years) was sent from the [facility] for worsening shortness of breath. History was obtained from the patient who stated that she had noticed increased shortness of breath with exertion for 3 days. She had 2 pillow orthopnea and was unable to sleep due to shortness of breath. Otherwise she denied any chest pain, any fever chills or productive cough.  She denied any abdominal pain nausea, vomiting or any hematemesis.  ED workup showed severe anemia with hemoglobin of 6.5, hematocrit 23.9, troponin positive at 0.1, elevated d-dimer at 2.62. At the time of triage BP was 182/109, HR 111.  Patient also reported that she has irregular menstrual periods, about 9-10 day long periods with large clots. Patient also had not seen a PCP or GYN MD in years.   Assessment/Plan:  Acute respiratory failure -Secondary to congestive heart failure from uncontrolled blood pressure compounded by anemia -Improving - continue Lasix at current dose -CTA of chest w/o evidence of PE  Microcytic Fe deficient anemia -Secondary to chronic blood loss from fibroid uterus -Hgb nadir of 6.5 -Has been transfused appropriately -s/p IV Fe tx  Mildly elevated troponin - deeply inverted T waves on EKG -likely due to uncontrolled blood pressure and congestive heart failure/LVH rather than acute coronary syndrome -recheck EKG w/o signif change today -pt denies chest pain or suggestion of anginal equivalent sx -cards consulted today.  Accelerated hypertension -BP is much better controlled   Acute Combined CHF -ECHO with EF 0000000, grade 2 diastolic dysfunction. -Cards consult called: will likely need cath. -Will add ACE-I, ASA, statin  Uterine  fibroid -Will need outpatient followup with OB/GYN  Abnormal thyroid function test - likely sick euthyroid -Free T4 is normal - patient does not report any symptoms of hypothyroidism  Hypokalemia Continue to replace    Code Status: Full code Family Communication: None  Disposition Plan: Cath in am.  Consultants: None   Antibiotics: None  DVT prophylaxis: SCDs  HPI/Subjective: The patient feels much better overall today.  She denies chest pain current shortness of breath fevers chills nausea or vomiting.  She has not yet been up and out of bed to any significant extent.  She has no specific questions for me today.  Objective: Blood pressure 149/85, pulse 87, temperature 98.6 F (37 C), temperature source Oral, resp. rate 18, height '5\' 4"'$  (1.626 m), weight 53.751 kg (118 lb 8 oz), SpO2 100.00%.  Intake/Output Summary (Last 24 hours) at 10/28/12 1539 Last data filed at 10/28/12 1518  Gross per 24 hour  Intake    840 ml  Output    400 ml  Net    440 ml    Exam: General: No acute respiratory distress at rest  Lungs: Clear to auscultation bilaterally without wheezes or crackles Cardiovascular: Regular rate and rhythm without murmur gallop or rub normal S1 and S2 Abdomen: Nontender, nondistended, soft, bowel sounds positive, no rebound, no ascites, no appreciable mass Extremities: No significant cyanosis, clubbing, or edema bilateral lower extremities  Data Reviewed: Basic Metabolic Panel:  Recent Labs Lab 10/25/12 1306 10/26/12 0518 10/27/12 0410 10/28/12 0440  NA 138 140 138 136  K 3.6 3.1* 3.3* 4.1  CL 105 103 102 102  CO2 '21 24 25 24  '$ GLUCOSE 93 95  95 80  BUN '10 11 16 19  '$ CREATININE 0.56 0.69 0.74 0.67  CALCIUM 9.0 9.0 9.2 9.4   Liver Function Tests:  Recent Labs Lab 10/25/12 1306  AST 34  ALT 13  ALKPHOS 72  BILITOT 0.2*  PROT 7.7  ALBUMIN 2.9*   CBC:  Recent Labs Lab 10/25/12 1306 10/26/12 0518 10/27/12 0410  WBC 6.3  --  9.7   NEUTROABS 4.6  --   --   HGB 6.5* 10.0* 9.6*  HCT 23.9* 32.7* 32.8*  MCV 63.4*  --  68.9*  PLT 223  --  194   Cardiac Enzymes:  Recent Labs Lab 10/25/12 1606 10/25/12 1748 10/26/12 0518 10/26/12 1006 10/26/12 2023 10/27/12 0410 10/28/12 0440  CKTOTAL  --  364* 274* 292*  --   --   --   CKMB  --  10.0* 7.1* 7.1*  --   --   --   TROPONINI <0.30 <0.30 <0.30 0.44* 0.33* 0.46* 0.38*   BNP (last 3 results)  Recent Labs  10/25/12 1306  PROBNP 3831.0*     Recent Results (from the past 240 hour(s))  MRSA PCR SCREENING     Status: None   Collection Time    10/25/12  5:34 PM      Result Value Range Status   MRSA by PCR NEGATIVE  NEGATIVE Final   Comment:            The GeneXpert MRSA Assay (FDA     approved for NASAL specimens     only), is one component of a     comprehensive MRSA colonization     surveillance program. It is not     intended to diagnose MRSA     infection nor to guide or     monitor treatment for     MRSA infections.      Scheduled Meds:  Scheduled Meds: . aspirin EC  81 mg Oral Daily  . atorvastatin  10 mg Oral q1800  . carvedilol  6.25 mg Oral BID WC  . ferrous gluconate  324 mg Oral BID WC  . furosemide      . furosemide  40 mg Oral Daily  . lisinopril  5 mg Oral Daily  . potassium chloride  40 mEq Oral Daily   Continuous Infusions:   Time spent on care of this patient: 35 minutes  Lelon Frohlich Pager: [place]  [phone] Pager - Text Page per Shea Evans as per below:  On-Call/Text Page:      [email or web address]      password [email or web address]  If 7PM-7AM, please contact night-coverage [email or web address] Password [email or web address] 10/28/2012, 3:39 PM   LOS: 3 days

## 2012-10-29 ENCOUNTER — Encounter (HOSPITAL_COMMUNITY)
Admission: EM | Disposition: A | Discharge status: Home / Self Care | Payer: Self-pay | Source: Home / Self Care | Attending: Internal Medicine

## 2012-10-29 ENCOUNTER — Encounter (HOSPITAL_COMMUNITY): Payer: Self-pay | Admitting: Cardiology

## 2012-10-29 DIAGNOSIS — I428 Other cardiomyopathies: Secondary | ICD-10-CM | POA: Diagnosis present

## 2012-10-29 DIAGNOSIS — Z9189 Other specified personal risk factors, not elsewhere classified: Secondary | ICD-10-CM | POA: Diagnosis present

## 2012-10-29 HISTORY — PX: LEFT AND RIGHT HEART CATHETERIZATION WITH CORONARY ANGIOGRAM: SHX5449

## 2012-10-29 HISTORY — DX: Other specified personal risk factors, not elsewhere classified: Z91.89

## 2012-10-29 LAB — POCT I-STAT 3, VENOUS BLOOD GAS (G3P V)
Acid-base deficit: 3 mmol/L — ABNORMAL HIGH (ref 0.0–2.0)
Bicarbonate: 21.4 mEq/L (ref 20.0–24.0)
O2 Saturation: 57 %
TCO2: 22 mmol/L (ref 0–100)
pCO2, Ven: 36.8 mmHg — ABNORMAL LOW (ref 45.0–50.0)
pH, Ven: 7.371 — ABNORMAL HIGH (ref 7.250–7.300)
pO2, Ven: 30 mmHg (ref 30.0–45.0)

## 2012-10-29 LAB — CBC
HCT: 37.1 % (ref 36.0–46.0)
HCT: 34.9 % — ABNORMAL LOW (ref 36.0–46.0)
Hemoglobin: 10.5 g/dL — ABNORMAL LOW (ref 12.0–15.0)
Hemoglobin: 10.3 g/dL — ABNORMAL LOW (ref 12.0–15.0)
MCH: 20.3 pg — ABNORMAL LOW (ref 26.0–34.0)
MCH: 20.8 pg — ABNORMAL LOW (ref 26.0–34.0)
MCHC: 28.3 g/dL — ABNORMAL LOW (ref 30.0–36.0)
MCHC: 29.5 g/dL — ABNORMAL LOW (ref 30.0–36.0)
MCV: 71.9 fL — ABNORMAL LOW (ref 78.0–100.0)
MCV: 70.4 fL — ABNORMAL LOW (ref 78.0–100.0)
Platelets: 246 10*3/uL (ref 150–400)
Platelets: 199 10*3/uL (ref 150–400)
RBC: 5.16 MIL/uL — ABNORMAL HIGH (ref 3.87–5.11)
RBC: 4.96 MIL/uL (ref 3.87–5.11)
RDW: 27.1 % — ABNORMAL HIGH (ref 11.5–15.5)
RDW: 27.9 % — ABNORMAL HIGH (ref 11.5–15.5)
WBC: 7.1 10*3/uL (ref 4.0–10.5)
WBC: 5.8 10*3/uL (ref 4.0–10.5)

## 2012-10-29 LAB — BASIC METABOLIC PANEL
BUN: 18 mg/dL (ref 6–23)
CO2: 24 mEq/L (ref 19–32)
Calcium: 9.8 mg/dL (ref 8.4–10.5)
Chloride: 101 mEq/L (ref 96–112)
Creatinine, Ser: 0.73 mg/dL (ref 0.50–1.10)
GFR calc Af Amer: >90 mL/min (ref >90)
GFR calc non Af Amer: >90 mL/min (ref >90)
Glucose, Bld: 91 mg/dL (ref 70–99)
Potassium: 4.2 mEq/L (ref 3.5–5.1)
Sodium: 136 mEq/L (ref 135–145)

## 2012-10-29 LAB — POCT I-STAT 3, ART BLOOD GAS (G3+)
Acid-base deficit: 1 mmol/L (ref 0.0–2.0)
Bicarbonate: 22.4 mEq/L (ref 20.0–24.0)
O2 Saturation: 97 %
TCO2: 23 mmol/L (ref 0–100)
pCO2 arterial: 33.4 mmHg — ABNORMAL LOW (ref 35.0–45.0)
pH, Arterial: 7.435 (ref 7.350–7.450)
pO2, Arterial: 82 mmHg (ref 80.0–100.0)

## 2012-10-29 LAB — CREATININE, SERUM
Creatinine, Ser: 0.59 mg/dL (ref 0.50–1.10)
GFR calc Af Amer: >90 mL/min (ref >90)
GFR calc non Af Amer: >90 mL/min (ref >90)

## 2012-10-29 LAB — PROTIME-INR
INR: 0.97 (ref 0.00–1.49)
Prothrombin Time: 12.8 seconds (ref 11.6–15.2)

## 2012-10-29 LAB — PREGNANCY, URINE: Preg Test, Ur: NEGATIVE

## 2012-10-29 SURGERY — LEFT AND RIGHT HEART CATHETERIZATION WITH CORONARY ANGIOGRAM
Anesthesia: LOCAL

## 2012-10-29 MED ORDER — ENOXAPARIN SODIUM 40 MG/0.4ML ~~LOC~~ SOLN
40.0000 mg | SUBCUTANEOUS | Status: DC
  Administered 2012-10-30 – 2012-11-01 (×3): 40 mg via SUBCUTANEOUS
  Filled 2012-10-29 (×4): qty 0.4

## 2012-10-29 MED ORDER — HEPARIN (PORCINE) IN NACL 2-0.9 UNIT/ML-% IJ SOLN
INTRAMUSCULAR | Status: AC
  Filled 2012-10-29: qty 1000

## 2012-10-29 MED ORDER — SODIUM CHLORIDE 0.9 % IJ SOLN
3.0000 mL | Freq: Two times a day (BID) | INTRAMUSCULAR | Status: DC
  Administered 2012-10-30 – 2012-11-01 (×4): 3 mL via INTRAVENOUS

## 2012-10-29 MED ORDER — FUROSEMIDE 40 MG PO TABS
40.0000 mg | ORAL_TABLET | Freq: Every day | ORAL | Status: DC
  Filled 2012-10-29: qty 1

## 2012-10-29 MED ORDER — LISINOPRIL 10 MG PO TABS
10.0000 mg | ORAL_TABLET | Freq: Every day | ORAL | Status: DC
Start: 2012-10-30 — End: 2012-10-30
  Filled 2012-10-29: qty 1

## 2012-10-29 MED ORDER — CARVEDILOL 3.125 MG PO TABS
3.1250 mg | ORAL_TABLET | Freq: Two times a day (BID) | ORAL | Status: DC
  Administered 2012-10-30 – 2012-11-01 (×6): 3.125 mg via ORAL
  Filled 2012-10-29 (×7): qty 1

## 2012-10-29 MED ORDER — SODIUM CHLORIDE 0.9 % IJ SOLN
3.0000 mL | INTRAMUSCULAR | Status: DC | PRN
Start: 2012-10-29 — End: 2012-11-01
  Administered 2012-11-01: 3 mL via INTRAVENOUS

## 2012-10-29 MED ORDER — LIDOCAINE HCL (PF) 1 % IJ SOLN
INTRAMUSCULAR | Status: AC
  Filled 2012-10-29: qty 30

## 2012-10-29 MED ORDER — SODIUM CHLORIDE 0.9 % IV SOLN: 250.0000 mL | INTRAVENOUS | Status: DC | PRN

## 2012-10-29 MED ORDER — FENTANYL CITRATE 0.05 MG/ML IJ SOLN
INTRAMUSCULAR | Status: AC
  Filled 2012-10-29: qty 2

## 2012-10-29 MED ORDER — SODIUM CHLORIDE 0.9 % IV SOLN: 1.0000 mL/kg/h | INTRAVENOUS | Status: AC

## 2012-10-29 NOTE — CV Procedure (Addendum)
[facility] RIGHT & LEFT HEART CATHETERIZATION REPORT  NAME:  Miranda [name]   MRN: [id number] DOB:  1966-06-03   ADMIT DATE: 10/25/2012 Procedure Date: 10/29/2012  INTERVENTIONAL CARDIOLOGIST: Marykay Lex, M.D., MS PRIMARY CARE PROVIDER: No primary provider on file. PRIMARY CARDIOLOGIST: Hilty, Kenneth C. ([place]), M.D.   PATIENT:  Miranda [name] is a 47 yo female with a history of uncontrolled HTN (apparently she saw Dr. Shana Chute years ago and was on anti-hypertensive medications, but discontinued taking them about 10 years ago), tobacco abuse, dyslipidemia (not on medications), and anemia. Family history of resistant hypertension in mother and grandmother. She presented after weeks of increasing shortness of breath, fatigue, orthopnea and PND. Found to be in CHF and echo reveals a moderately dilated LV with global hypokinesis and EF of 15-20%. She is referred for right heart catheterization for definitive coronary assessment for measurement of cardiac output and right heart pressures.   PRE-OPERATIVE DIAGNOSIS:    New diagnosis of severe dilated cardiac myopathy with EF 15-20%  Long-standing hypertension  PROCEDURES PERFORMED:    Right & Left Heart Catheterization with Native Coronary Angiography  via 5 French Right Common Femoral Artery &  7 French Right Common Femoral Vein Access.  Left Ventriculography  PROCEDURE: The patient was brought to the 2nd Floor [facility] in the fasting state and prepped and draped in the usual sterile fashion for Right groin access. Sterile technique was used including antiseptics, cap, gloves, gown, hand hygiene, mask and sheet. Skin prep: Chlorhexidine.   Consent: Risks of procedure as well as the alternatives and risks of each were explained to the (patient/caregiver). Consent for procedure obtained.   Time Out: Verified patient identification, verified procedure, site/side was marked, verified  correct patient position, special equipment/implants available, medications/allergies/relevent history reviewed, required imaging and test results available. Performed.  Access:  Right Common Femoral Artery: 5 Fr Sheath - fluoroscopically guided modified Seldinger Technique  Right Common Femoral Vein: 7 Fr Sheath - Seldinger Technique.  Right Heart Catheterization: 7Fr Theone Murdoch catheter advanced under fluoroscopy with balloon inflated to the RA, RV, then PCWP-PA for hemodynamic measurement.  Simultaneous FA & PA blood gases checked for SaO2% to calculate FICK CO/CI  Thermodilution Injections performed to calculate CO/CI  Simultaneous PCWP/LV & RV/LV pressures monitored with Angled Pigtail in LV.  Catheter removed completely out of the body with balloon deflated.  Left Heart Catheterization:  5 Fr Catheters advanced or exchanged over a J-wire;  angled pigtail catheter advanced first  To allow for simultaneous right and left-sided pressures. LV Hemodynamics (LV Gram):  Angled Pigtail Catheter Left Coronary Artery Cineangiography:  JL4 Catheter  Right Coronary Artery Cineangiography:  JR 4 Catheter   Sheath removed in the holding area with manual pressure for hemostasis.   MEDICATIONS:   ANESTHESIA: Local Lidocaine 18 ml   SEDATION: 25 mcg IV fentanyl   Omnipaque contrast  30  mL  EBL: < 10 ml, not including ABG and VBG samples   FINDINGS:  Hemodynamics:  Findings:   SaO2%  Pressures mmHg  Mean P  mmHg  EDP  mmHg   Right Atrium    7/5    5   Right Ventricle    35/5 ; 47/7 without wedge pillow    6; 10   Pulmonary Artery   57%   35/19   26   PCWP    19/21 ; 20/26   17; 23    Central Aortic  97%  136/91  110    Left Ventricle    136/15    20-24          Cardiac Output:   Cardiac Index:    Fick   3.66    2.33    Thermodilution   3.57    2.27     Coronary Anatomy:   Left Main: Very short large-caliber vessel that trifurcates into the LAD, Circumflex and Ramus Intermedius;  angiographically normal LAD:  A large caliber vessel which reaches down around the apex. It tapers from a large caliber to small-caliber vessel distally. There are 2 diagonal branches the second of which is small caliber but the major branch. There 2 significant septal trunks. Angiographically normal  Left Circumflex: Large-caliber vessel that is a sharp bend is a goes into the AV groove. At this point is a very small AV groove vessel and terminates as a bifurcating lateral OM that gives off OM 1 and OM 2; angiographically normal  OM1: Moderate caliber vessel reaches but two thirds the way down to the inferoapex.  OM2 moderate caliber, tortuous vessel that reaches down almost to the inferoapex along the inferolateral wall.    Ramus intermedius: Small-caliber vessel covering a short portion of the anterior wall. Angiographically normal   RCA: Large-caliber dominant vessel that gives off 2 RV marginal branches before bifurcates distally into the RPDA and Right Posterior AV Groove Branch. There is maybe 10-20% eccentric stenosis in the mid RCA, but no significant coronary disease up into her system.  Right PDA: Moderate caliber vessel reaches down almost to the apex with several septal perforators. Tapers to small vessel distally.  Right AV Groove - Posterolateral System: Begins as a moderate caliber vessel that gives off 2 major small to moderate caliber posterior lateral branches and AV nodal artery.  PATIENT DISPOSITION:    The patient was transferred to the PACU holding area in a hemodynamicaly stable, chest pain free condition.  The patient tolerated the procedure well, and there were no complications.  The patient was stable before, during, and after the procedure.  POST-OPERATIVE DIAGNOSIS:    Severe nonischemic dilated cardiomyopathy with ejection fraction roughly 15% that correlates well with cardiac output of roughly 3.6 tablet Fick and thermal dilution.  Mildly elevated LVEDP  that correlates well with the pulmonary tablet wedge pressure of roughly 20-23 mmHg, with essentially normal right ventricular pressures.  Angiographic normal coronary arteries with only minimal luminal irregularities in the RCA system.  PLAN OF CARE:  Standard post catheterization care.  Restart Lasix and ACE inhibitor as well as beta blocker.  I will write for oral Lasix tonight.  She otherwise is relatively well, and with maybe trivial amount of diuresis left.  She definitely qualifies for LifeVest prior to discharge as she is diabetic high risk for sudden cardiac death.  We will consult the Advanced Heart Failure Service for assistance with additional diagnostic and long-term treatments options.  With low CO - will decrease BB dose & increase afterload reduction ACE-I.  [facility] will be happy to take over as primary service.  Appreciate TRH assistance.     Marykay Lex, M.D., M.S. [facility] [address] [place], [place]  [address]  [phone]  08/25/2012 4:44 PM

## 2012-10-29 NOTE — Progress Notes (Signed)
[facility] Progress Note [place] TEAM 1 - Stepdown/ICU TEAM   Miranda [name] [id number] DOB: 07/04/1966 DOA: 10/25/2012 PCP: No primary provider on file.  Brief narrative: 47 year old female with history of hypertension (has not been taking medication for last 10 years) was sent from the [facility] for worsening shortness of breath. History was obtained from the patient who stated that she had noticed increased shortness of breath with exertion for 3 days. She had 2 pillow orthopnea and was unable to sleep due to shortness of breath. Otherwise she denied any chest pain, any fever chills or productive cough.  She denied any abdominal pain nausea, vomiting or any hematemesis.  ED workup showed severe anemia with hemoglobin of 6.5, hematocrit 23.9, troponin positive at 0.1, elevated d-dimer at 2.62. At the time of triage BP was 182/109, HR 111.  Patient also reported that she has irregular menstrual periods, about 9-10 day long periods with large clots. Patient also had not seen a PCP or GYN MD in years.   Assessment/Plan:  Acute respiratory failure -Secondary to congestive heart failure from uncontrolled blood pressure compounded by anemia -Improving - continue Lasix at current dose -CTA of chest w/o evidence of PE  Microcytic Fe deficient anemia -Secondary to chronic blood loss from fibroid uterus -Hgb nadir of 6.5 -Has been transfused appropriately -s/p IV Fe tx  Mildly elevated troponin - deeply inverted T waves on EKG -likely due to uncontrolled blood pressure and congestive heart failure/LVH rather than acute coronary syndrome -recheck EKG w/o signif change today -pt denies chest pain or suggestion of anginal equivalent sx -cards consulted today.  Accelerated hypertension -BP is much better controlled   Acute Combined CHF -ECHO with EF 0000000, grade 2 diastolic dysfunction. -For cath today. -Appreciate cards assistance. -Have added ACE-I, ASA,  statin  Uterine fibroid -Will need outpatient followup with OB/GYN  Abnormal thyroid function test - likely sick euthyroid -Free T4 is normal - patient does not report any symptoms of hypothyroidism  Hypokalemia Continue to replace    Code Status: Full code Family Communication: Son at bedside Disposition Plan: Cath in am.  Consultants: None   Antibiotics: None  DVT prophylaxis: SCDs  HPI/Subjective: The patient feels much better overall today.  She denies chest pain current shortness of breath fevers chills nausea or vomiting.  She has not yet been up and out of bed to any significant extent.  She has no specific questions for me today.  Objective: Blood pressure 100/70, pulse 74, temperature 98 F (36.7 C), temperature source Oral, resp. rate 19, height '5\' 4"'$  (1.626 m), weight 53.524 kg (118 lb), SpO2 97.00%.  Intake/Output Summary (Last 24 hours) at 10/29/12 1456 Last data filed at 10/29/12 1347  Gross per 24 hour  Intake    483 ml  Output    650 ml  Net   -167 ml    Exam: General: No acute respiratory distress at rest  Lungs: Clear to auscultation bilaterally without wheezes or crackles Cardiovascular: Regular rate and rhythm without murmur gallop or rub normal S1 and S2 Abdomen: Nontender, nondistended, soft, bowel sounds positive, no rebound, no ascites, no appreciable mass Extremities: No significant cyanosis, clubbing, or edema bilateral lower extremities  Data Reviewed: Basic Metabolic Panel:  Recent Labs Lab 10/25/12 1306 10/26/12 0518 10/27/12 0410 10/28/12 0440 10/29/12 0700  NA 138 140 138 136 136  K 3.6 3.1* 3.3* 4.1 4.2  CL 105 103 102 102 101  CO2 '21 24 25 24 '$ 24  GLUCOSE 93 95 95 80 91  BUN '10 11 16 19 18  '$ CREATININE 0.56 0.69 0.74 0.67 0.73  CALCIUM 9.0 9.0 9.2 9.4 9.8   Liver Function Tests:  Recent Labs Lab 10/25/12 1306  AST 34  ALT 13  ALKPHOS 72  BILITOT 0.2*  PROT 7.7  ALBUMIN 2.9*   CBC:  Recent Labs Lab  10/25/12 1306 10/26/12 0518 10/27/12 0410 10/29/12 0700  WBC 6.3  --  9.7 7.1  NEUTROABS 4.6  --   --   --   HGB 6.5* 10.0* 9.6* 10.5*  HCT 23.9* 32.7* 32.8* 37.1  MCV 63.4*  --  68.9* 71.9*  PLT 223  --  194 246   Cardiac Enzymes:  Recent Labs Lab 10/25/12 1606 10/25/12 1748 10/26/12 0518 10/26/12 1006 10/26/12 2023 10/27/12 0410 10/28/12 0440  CKTOTAL  --  364* 274* 292*  --   --   --   CKMB  --  10.0* 7.1* 7.1*  --   --   --   TROPONINI <0.30 <0.30 <0.30 0.44* 0.33* 0.46* 0.38*   BNP (last 3 results)  Recent Labs  10/25/12 1306 10/28/12 1439  PROBNP 3831.0* 1028.0*     Recent Results (from the past 240 hour(s))  MRSA PCR SCREENING     Status: None   Collection Time    10/25/12  5:34 PM      Result Value Range Status   MRSA by PCR NEGATIVE  NEGATIVE Final   Comment:            The GeneXpert MRSA Assay (FDA     approved for NASAL specimens     only), is one component of a     comprehensive MRSA colonization     surveillance program. It is not     intended to diagnose MRSA     infection nor to guide or     monitor treatment for     MRSA infections.      Scheduled Meds:  Scheduled Meds: . [facility] HOLD] aspirin EC  81 mg Oral Daily  . [facility] HOLD] atorvastatin  10 mg Oral q1800  . [facility] HOLD] carvedilol  6.25 mg Oral BID WC  . [facility] HOLD] ferrous gluconate  324 mg Oral BID WC  . [facility] HOLD] furosemide  40 mg Oral Daily  . [MAR HOLD] lisinopril  5 mg Oral Daily  . [facility] HOLD] potassium chloride  40 mEq Oral Daily  . sodium chloride  3 mL Intravenous Q12H   Continuous Infusions:   Time spent on care of this patient: 35 minutes  Lelon Frohlich Pager: [place]  [phone] Pager - Text Page per Shea Evans as per below:  On-Call/Text Page:      [email or web address]      password [email or web address]  If 7PM-7AM, please contact night-coverage [email or web address] Password [facility] 10/29/2012, 2:56 PM   LOS: 4 days

## 2012-10-29 NOTE — Progress Notes (Signed)
[facility]  Subjective: SOB has improved some. No chest pain.   Objective: Vital signs in last 24 hours: Temp:  [97.9 F (36.6 C)-98.6 F (37 C)] 98 F (36.7 C) (03/28 0526) Pulse Rate:  [82-91] 84 (03/28 0526) Resp:  [18-19] 19 (03/28 0526) BP: (111-149)/(69-87) 111/72 mmHg (03/28 0526) SpO2:  [100 %] 100 % (03/28 0526) Weight:  [118 lb (53.524 kg)] 118 lb (53.524 kg) (03/28 0526) Last BM Date: 10/28/12  Intake/Output from previous day: 03/27 0701 - 03/28 0700 In: 720 [P.O.:720] Out: 350 [Urine:350] Intake/Output this shift:    Medications Current Facility-Administered Medications  Medication Dose Route Frequency Provider Last Rate Last Dose  . 0.9 %  sodium chloride infusion  250 mL Intravenous PRN Nelli Swalley, PA-C      . acetaminophen (TYLENOL) tablet 650 mg  650 mg Oral Q4H PRN Ripudeep K Rai, MD      . aspirin EC tablet 81 mg  81 mg Oral Daily Estela Isaiah Blakes, MD   81 mg at 10/28/12 1253  . atorvastatin (LIPITOR) tablet 10 mg  10 mg Oral q1800 Henderson Cloud, MD   10 mg at 10/28/12 1715  . carvedilol (COREG) tablet 6.25 mg  6.25 mg Oral BID WC Chrystie Nose, MD   6.25 mg at 10/28/12 1711  . ferrous gluconate (FERGON) tablet 324 mg  324 mg Oral BID WC Calvert Cantor, MD   324 mg at 10/28/12 1711  . furosemide (LASIX) tablet 40 mg  40 mg Oral Daily Lonia Blood, MD   40 mg at 10/28/12 1020  . lisinopril (PRINIVIL,ZESTRIL) tablet 5 mg  5 mg Oral Daily Estela Isaiah Blakes, MD   5 mg at 10/28/12 1253  . ondansetron (ZOFRAN) injection 4 mg  4 mg Intravenous Q6H PRN Ripudeep K Rai, MD      . potassium chloride SA (K-DUR,KLOR-CON) CR tablet 40 mEq  40 mEq Oral Daily Calvert Cantor, MD   40 mEq at 10/28/12 1019  . sodium chloride 0.9 % injection 3 mL  3 mL Intravenous Q12H Jini Horiuchi, PA-C   3 mL at 10/28/12 2237  . sodium chloride 0.9 % injection 3 mL  3 mL Intravenous PRN Robbie Lis, PA-C         PE: General appearance: alert, cooperative and no distress Lungs: clear to auscultation bilaterally Heart: regular rate and rhythm Extremities: no LEE Pulses: 2+ and symmetric Skin: warm and dry Neurologic: Grossly normal  Lab Results:   Recent Labs  10/27/12 0410 10/29/12 0700  WBC 9.7 7.1  HGB 9.6* 10.5*  HCT 32.8* 37.1  PLT 194 246   BMET  Recent Labs  10/27/12 0410 10/28/12 0440 10/29/12 0700  NA 138 136 136  K 3.3* 4.1 4.2  CL 102 102 101  CO2 25 24 24   GLUCOSE 95 80 91  BUN 16 19 18   CREATININE 0.74 0.67 0.73  CALCIUM 9.2 9.4 9.8   PT/INR  Recent Labs  10/29/12 0700  LABPROT 12.8  INR 0.97    Cardiac Panel (last 3 results)  Recent Labs  10/26/12 1006 10/26/12 2023 10/27/12 0410 10/28/12 0440  CKTOTAL 292*  --   --   --   CKMB 7.1*  --   --   --   TROPONINI 0.44* 0.33* 0.46* 0.38*  RELINDX 2.4  --   --   --     Assessment/Plan  Principal Problem:   Acute respiratory failure Active  Problems:   Anemia-microcytic   Elevated troponin   Accelerated hypertension   Acute on chronic combined systolic and diastolic CHF (congestive heart failure)   Uterine fibroid   Abnormal thyroid function test   CHF - Combined Systolic + Diastolic Dysfunction. EF of 15-20% with Grade II diastolic dysfunction on echo 10/27/12  Plan: BNP has improved to 1,028 (3,831 on admission). Breathing has improved. EF of 15-20% on recent Echo. Continue on current regimen of BB, ACE-I, Lasix, ASA and statin. Plan for [facility] today to assess volume status, pressures and coronaries. She has been NPO since midnight. Renal function is normal with SCr. of 0.73. INR is 0.97. HR is stable. She remains normotensive, most recent BP is 111/72. I have ordered am dose of Lasix and ACE-I to be held in anticipation of cath. If cath does not reveal reversible ischemia, will need a LifeVest prior to discharge. Hgb A1c yesterday was 5.3. Will continue to follow post cath.    LOS: 4 days     Jimeka Balan M. Delmer Islam 10/29/2012 9:29 AM

## 2012-10-29 NOTE — Progress Notes (Signed)
I have seen and evaluated the patient this PM along with Boyce Medici, PA. I agree with her findings, examination as well as impression recommendations.  She is lying flat this AM.  In fact, she barely woke up for me.   Says she is breathing the best this AM since prior to arrival.  I agree with Dr. Blanchie Dessert plan to proceed with R&LHC -- will use femoral approach, since she is quite thin.  I do suspect "burned out" Hypertensive Cardiomyopathy as the etiology, but definitive Dx is needed. Dr. Rennis Golden graciously explained the procedure with R/B/A/I to the patient & she is willing to proceed.  She is frustrated with NPO status, and is not wanting to talk with me. I will allow her a cup of water & crackers since her cath has been delayed due to Emergent & Urgent cases.  BP & HR look good.  Troponin level is dropping (additional need for Cath).  Agree with considering LifeVest & potentially advanced HF consult.   Will adjust HF meds & diuretic based upon RHC findings & LVEDP.   Marykay Lex, M.D., M.S. [facility] [address] [place], [place]  [address]  [phone] Pager # [phone] 10/29/2012 1:19 PM

## 2012-10-29 NOTE — Progress Notes (Signed)
PT Cancellation Note  Patient Details Name: Yomaris Palecek MRN: [id number] DOB: 03-28-1966   Cancelled Treatment:    Reason Eval/Treat Not Completed: Fatigue/lethargy limiting ability to participate; patient awaiting cardiac cath and frustrated with NPO status.  Will check back on Monday.   WYNN,CYNDI 10/29/2012, 12:35 PM

## 2012-10-30 ENCOUNTER — Encounter (HOSPITAL_COMMUNITY): Payer: Self-pay | Admitting: Cardiology

## 2012-10-30 DIAGNOSIS — Z9889 Other specified postprocedural states: Secondary | ICD-10-CM

## 2012-10-30 HISTORY — DX: Other specified postprocedural states: Z98.890

## 2012-10-30 LAB — BASIC METABOLIC PANEL
BUN: 19 mg/dL (ref 6–23)
CO2: 24 mEq/L (ref 19–32)
Calcium: 9.5 mg/dL (ref 8.4–10.5)
Chloride: 102 mEq/L (ref 96–112)
Creatinine, Ser: 0.65 mg/dL (ref 0.50–1.10)
GFR calc Af Amer: >90 mL/min (ref >90)
GFR calc non Af Amer: >90 mL/min (ref >90)
Glucose, Bld: 87 mg/dL (ref 70–99)
Potassium: 4.1 mEq/L (ref 3.5–5.1)
Sodium: 136 mEq/L (ref 135–145)

## 2012-10-30 LAB — PRO B NATRIURETIC PEPTIDE: Pro B Natriuretic peptide (BNP): 491.2 pg/mL — ABNORMAL HIGH (ref 0–125)

## 2012-10-30 MED ORDER — FUROSEMIDE 40 MG PO TABS
40.0000 mg | ORAL_TABLET | Freq: Two times a day (BID) | ORAL | Status: DC
  Administered 2012-10-30 – 2012-11-01 (×5): 40 mg via ORAL
  Filled 2012-10-30 (×6): qty 1

## 2012-10-30 MED ORDER — LISINOPRIL 5 MG PO TABS
5.0000 mg | ORAL_TABLET | Freq: Every day | ORAL | Status: DC
Start: 2012-10-31 — End: 2012-10-31
  Filled 2012-10-30: qty 1

## 2012-10-30 NOTE — Progress Notes (Signed)
I have seen and evaluated the patient this AM along with Miranda Boozer, NP.  I agree with her findings, examination as well as impression recommendations.  She seems much more awake & alert today.  Actually smiling.  Slept ok last PM without SOB or CP.  Groin is stable. I don't think she remembered my explanation of her cath yesterday.    Normal coronary arteries & only mildly elevated PCWP/LVEDP -- she may well be at a near euvolemic baseline &  Currently on bid Lasix.  -- no overt CHF. The concerning data was the CO of ~3.6.    We have decreased BB dose per Dr. Prescott Gum recommendations -- he has graciously agreed to consult for Advanced HF recommendations.  I conjunction, I had increased Lisinopril, but will need to back down to the 5 mg dose due to borderline Hypotension this AM.  Will need to ambulate her today & tomorrow.  LifeVest ordered -- will need prior to d/c.  I discussed her condition & need for LifeVest with her.  She will need close f/u & may well benefit from being established in the CHF clinic to allow for frequent visits & monitoring.     Marykay Lex, M.D., M.S. [facility] [address] [place], [place]  [address]  [phone] Pager # [phone] 10/30/2012 10:59 AM

## 2012-10-30 NOTE — Progress Notes (Signed)
Subjective: Discussed cath and CHF, and life vest.  No complaints this am  Objective: Vital signs in last 24 hours: Temp:  [98 F (36.7 C)-98.4 F (36.9 C)] 98.2 F (36.8 C) (03/29 0542) Pulse Rate:  [74-83] 80 (03/29 0542) Resp:  [18-20] 20 (03/29 0542) BP: (87-136)/(53-89) 111/78 mmHg (03/29 0542) SpO2:  [96 %-100 %] 100 % (03/29 0542) Weight:  [53.479 kg (117 lb 14.4 oz)] 53.479 kg (117 lb 14.4 oz) (03/29 0542) Weight change: -0.045 kg (-1.6 oz) Last BM Date: 10/28/12 Intake/Output from previous day: -171  Wt 53.4 down from 56.1 on admit  123 to 117 pounds  03/28 0701 - 03/29 0700 In: 993.4 [P.O.:920; I.V.:73.4] Out: 925 [Urine:925] Intake/Output this shift:    PE: General:alert and oriented, MAE, follows commands, pleasant affect Neck:no JVD to tr sitting up  Heart:S1S2 RRR no obvious murmur Lungs:clear without rales, rhonchi or wheezes Abd:+ BS, soft, non tender Ext:no edema, grouin cath site without hematoma    Lab Results:  Recent Labs  10/29/12 0700 10/29/12 1708  WBC 7.1 5.8  HGB 10.5* 10.3*  HCT 37.1 34.9*  PLT 246 199   BMET  Recent Labs  10/28/12 0440 10/29/12 0700 10/29/12 1708  NA 136 136  --   K 4.1 4.2  --   CL 102 101  --   CO2 24 24  --   GLUCOSE 80 91  --   BUN 19 18  --   CREATININE 0.67 0.73 0.59  CALCIUM 9.4 9.8  --     Recent Labs  10/28/12 0440  TROPONINI 0.38*    Lab Results  Component Value Date   CHOL 177 10/26/2012   HDL 41 10/26/2012   LDLCALC 113* 10/26/2012   TRIG 115 10/26/2012   CHOLHDL 4.3 10/26/2012   Lab Results  Component Value Date   HGBA1C 5.6 10/28/2012     Lab Results  Component Value Date   TSH 38.232* 10/25/2012    Hepatic Function Panel Studies/Results: Cardiac cath 10/29/12: POST-OPERATIVE DIAGNOSIS:  Severe nonischemic dilated cardiomyopathy with ejection fraction roughly 15% that correlates well with cardiac output of roughly 3.6 tablet Fick and thermal dilution.  Mildly elevated LVEDP that  correlates well with the pulmonary tablet wedge pressure of roughly 20-23 mmHg, with essentially normal right ventricular pressures.  Angiographic normal coronary arteries with only minimal luminal irregularities in the RCA system   Medications: I have reviewed the patient's current medications. Marland Kitchen aspirin EC  81 mg Oral Daily  . atorvastatin  10 mg Oral q1800  . carvedilol  3.125 mg Oral BID WC  . enoxaparin (LOVENOX) injection  40 mg Subcutaneous Q24H  . ferrous gluconate  324 mg Oral BID WC  . furosemide  40 mg Oral Daily  . lisinopril  10 mg Oral Daily  . potassium chloride  40 mEq Oral Daily  . sodium chloride  3 mL Intravenous Q12H   Assessment/Plan: Principal Problem:   Acute respiratory failure Active Problems:   CHF - Combined Systolic + Diastolic Dysfunction. EF of 15-20% with Grade II diastolic dysfunction on echo 10/27/12   At risk for sudden cardiac death   Non-ischemic cardiomyopathy - EF 15-20%; Cardiac Output ~3.6   Elevated troponin, secondary to CHF   S/P cardiac catheterization, 10/29/12, normal coronaries with minimal luminal irregularities in RCA system   Anemia-microcytic   Accelerated hypertension   Uterine fibroid   Abnormal thyroid function test  PLAN: Pt did not remember discussion of cath results to review.  Needs Life vest, has been ordered.  Advanced HF team to see. Will check labs today post cath and recheck pro BNP.  LOS: 5 days   Time spent with pt. :20 minutes. Rosaleigh Brazzel R 10/30/2012, 9:19 AM

## 2012-10-30 NOTE — Progress Notes (Signed)
1140-1200 Observed pt walking independently in hall with steady gait. She said this was her second walk today. She is walking about 460 ft each time. Denied SOB. Gave pt ex education and she voice understanding. Pt to watch lifevest video today. Gave her number of video.Luetta Nutting RN BSN

## 2012-10-31 LAB — BASIC METABOLIC PANEL
BUN: 13 mg/dL (ref 6–23)
CO2: 24 mEq/L (ref 19–32)
Calcium: 9.3 mg/dL (ref 8.4–10.5)
Chloride: 101 mEq/L (ref 96–112)
Creatinine, Ser: 0.63 mg/dL (ref 0.50–1.10)
GFR calc Af Amer: >90 mL/min (ref >90)
GFR calc non Af Amer: >90 mL/min (ref >90)
Glucose, Bld: 90 mg/dL (ref 70–99)
Potassium: 4.1 mEq/L (ref 3.5–5.1)
Sodium: 137 mEq/L (ref 135–145)

## 2012-10-31 MED ORDER — LISINOPRIL 5 MG PO TABS
7.5000 mg | ORAL_TABLET | Freq: Every day | ORAL | Status: DC
  Administered 2012-10-31 – 2012-11-01 (×2): 7.5 mg via ORAL
  Filled 2012-10-31 (×2): qty 1

## 2012-10-31 NOTE — Progress Notes (Signed)
Subjective:  no complaints  Objective: Vital signs in last 24 hours: Temp:  [97.5 F (36.4 C)-98 F (36.7 C)] 97.9 F (36.6 C) (03/30 0924) Pulse Rate:  [71-94] 94 (03/30 0924) Resp:  [18] 18 (03/30 0924) BP: (96-138)/(63-88) 131/86 mmHg (03/30 0924) SpO2:  [96 %-100 %] 100 % (03/30 0924) Weight:  [53.57 kg (118 lb 1.6 oz)] 53.57 kg (118 lb 1.6 oz) (03/30 0604) Weight change: 0.091 kg (3.2 oz) Last BM Date: 10/31/12 Intake/Output from previous day: +480  Wt 53.5 stable 03/29 0701 - 03/30 0700 In: 240 [P.O.:240] Out: -  Intake/Output this shift:    PE: General:alert and oriented, no complaints, ambulating in hall Neck:supple, no JVD standing Heart:S1S2 RRR-  SR Lungs:clear without rales, rhonchi or wheezes Abd:+ BS, soft, non tender Ext:no edema    Lab Results:  Recent Labs  10/29/12 0700 10/29/12 1708  WBC 7.1 5.8  HGB 10.5* 10.3*  HCT 37.1 34.9*  PLT 246 199   BMET  Recent Labs  10/30/12 1029 10/31/12 0535  NA 136 137  K 4.1 4.1  CL 102 101  CO2 24 24  GLUCOSE 87 90  BUN 19 13  CREATININE 0.65 0.63  CALCIUM 9.5 9.3   No results found for this basename: TROPONINI, CK, MB,  in the last 72 hours  Lab Results  Component Value Date   CHOL 177 10/26/2012   HDL 41 10/26/2012   LDLCALC 113* 10/26/2012   TRIG 115 10/26/2012   CHOLHDL 4.3 10/26/2012   Lab Results  Component Value Date   HGBA1C 5.6 10/28/2012     Lab Results  Component Value Date   TSH 38.232* 10/25/2012      Studies/Results: No results found.  Medications: I have reviewed the patient's current medications. Scheduled Meds: . aspirin EC  81 mg Oral Daily  . atorvastatin  10 mg Oral q1800  . carvedilol  3.125 mg Oral BID WC  . enoxaparin (LOVENOX) injection  40 mg Subcutaneous Q24H  . ferrous gluconate  324 mg Oral BID WC  . furosemide  40 mg Oral BID  . lisinopril  7.5 mg Oral Daily  . potassium chloride  40 mEq Oral Daily  . sodium chloride  3 mL Intravenous Q12H   Continuous  Infusions:  PRN Meds:.sodium chloride, acetaminophen, ondansetron (ZOFRAN) IV, sodium chloride  Assessment/Plan: Principal Problem:   Acute respiratory failure Active Problems:   CHF - Combined Systolic + Diastolic Dysfunction. EF of 15-20% with Grade II diastolic dysfunction on echo 10/27/12   At risk for sudden cardiac death   Non-ischemic cardiomyopathy - EF 15-20%; Cardiac Output ~3.6   Elevated troponin, secondary to CHF   S/P cardiac catheterization, 10/29/12, normal coronaries with minimal luminal irregularities in RCA system   Anemia-microcytic   Accelerated hypertension   Uterine fibroid   Abnormal thyroid function test  PLAN: Pro BNP yesterday 491, feels well, Life vest in room, she stated she understood how to use.  Meds adjusted. Prob d/c home in am.  LOS: 6 days   Time spent with pt.including MD time : 15 minutes. INGOLD,LAURA R 10/31/2012, 9:28 AM  I have seen and evaluated the patient this AM along with Nada Boozer, NP. I agree with her findings, examination as well as impression recommendations.  Ms. Murthy looks much better today.  Did well walking.  Is happy with LifeVest.    Her BP has recovered (possibly with reduced BB dose) -- will titrate up ACE-I for additional afterload reduction.  Possibly  needs HR for compensation for low CO.   Anticipate Advanced HF Svc consultation in AM -- provided she is doing as well tomorrow as today, I feel that she should be fine for d/c tomorrow. Is on PO Lasix -- & with BNP ~400-500, may well be close to euvolemic.   Marykay Lex, M.D., M.S. [facility] [address] [place], [place]  [address]  [phone] Pager # [phone] 10/31/2012 10:22 AM

## 2012-11-01 DIAGNOSIS — I5021 Acute systolic (congestive) heart failure: Secondary | ICD-10-CM

## 2012-11-01 LAB — BASIC METABOLIC PANEL
BUN: 13 mg/dL (ref 6–23)
CO2: 26 mEq/L (ref 19–32)
Calcium: 9.4 mg/dL (ref 8.4–10.5)
Chloride: 103 mEq/L (ref 96–112)
Creatinine, Ser: 0.6 mg/dL (ref 0.50–1.10)
GFR calc Af Amer: >90 mL/min (ref >90)
GFR calc non Af Amer: >90 mL/min (ref >90)
Glucose, Bld: 89 mg/dL (ref 70–99)
Potassium: 3.8 mEq/L (ref 3.5–5.1)
Sodium: 139 mEq/L (ref 135–145)

## 2012-11-01 LAB — HIV ANTIBODY (ROUTINE TESTING W REFLEX): HIV: NONREACTIVE

## 2012-11-01 LAB — LACTATE DEHYDROGENASE: LDH: 318 U/L — ABNORMAL HIGH (ref 94–250)

## 2012-11-01 MED ORDER — ASPIRIN 81 MG PO TBEC: 81.0000 mg | DELAYED_RELEASE_TABLET | Freq: Every day | ORAL | Status: AC

## 2012-11-01 MED ORDER — SODIUM CHLORIDE 0.9 % IV SOLN
500.0000 mg | Freq: Once | INTRAVENOUS | Status: AC
  Administered 2012-11-01: 500 mg via INTRAVENOUS
  Filled 2012-11-01: qty 10

## 2012-11-01 MED ORDER — FUROSEMIDE 40 MG PO TABS: 40.0000 mg | ORAL_TABLET | Freq: Two times a day (BID) | ORAL | Status: DC

## 2012-11-01 MED ORDER — POTASSIUM CHLORIDE CRYS ER 20 MEQ PO TBCR: 40.0000 meq | EXTENDED_RELEASE_TABLET | Freq: Every day | ORAL | Status: DC

## 2012-11-01 MED ORDER — ACETAMINOPHEN 325 MG PO TABS: 650.0000 mg | ORAL_TABLET | ORAL | Status: DC | PRN

## 2012-11-01 MED ORDER — ATORVASTATIN CALCIUM 10 MG PO TABS: 10.0000 mg | ORAL_TABLET | Freq: Every day | ORAL | Status: DC

## 2012-11-01 MED ORDER — FERROUS GLUCONATE 324 (38 FE) MG PO TABS: 324.0000 mg | ORAL_TABLET | Freq: Two times a day (BID) | ORAL | Status: DC

## 2012-11-01 MED ORDER — CARVEDILOL 3.125 MG PO TABS: 3.1250 mg | ORAL_TABLET | Freq: Two times a day (BID) | ORAL | Status: DC

## 2012-11-01 MED ORDER — LISINOPRIL 2.5 MG PO TABS: 7.5000 mg | ORAL_TABLET | Freq: Every day | ORAL | Status: DC

## 2012-11-01 NOTE — Progress Notes (Signed)
Subjective: No complaints, no SOB  Objective: Vital signs in last 24 hours: Temp:  [97.8 F (36.6 C)-98.2 F (36.8 C)] 98.2 F (36.8 C) (03/31 0600) Pulse Rate:  [73-88] 75 (03/31 0600) Resp:  [18] 18 (03/31 0600) BP: (109-129)/(72-83) 129/83 mmHg (03/31 0600) SpO2:  [100 %] 100 % (03/31 0600) Weight:  [52.617 kg (116 lb)] 52.617 kg (116 lb) (03/31 0600) Weight change: -0.953 kg (-2 lb 1.6 oz) Last BM Date: 10/31/12 Intake/Output from previous day: -210   Wt 52.6  Down from 53.5 03/30 0701 - 03/31 0700 In: 840 [P.O.:840] Out: 1050 [Urine:1050] Intake/Output this shift:    PE: General:alert and oriented, pleasant affect,  Neck:no JVD Heart:S1S2 RRR no obvious murmur Lungs:clear without rales, rhonchi or wheezes Abd:+ BS, soft, non tender Ext:no edema. + pedal pulses    Lab Results:  Recent Labs  10/29/12 1708  WBC 5.8  HGB 10.3*  HCT 34.9*  PLT 199   BMET  Recent Labs  10/31/12 0535 11/01/12 0555  NA 137 139  K 4.1 3.8  CL 101 103  CO2 24 26  GLUCOSE 90 89  BUN 13 13  CREATININE 0.63 0.60  CALCIUM 9.3 9.4   No results found for this basename: TROPONINI, CK, MB,  in the last 72 hours  Lab Results  Component Value Date   CHOL 177 10/26/2012   HDL 41 10/26/2012   LDLCALC 113* 10/26/2012   TRIG 115 10/26/2012   CHOLHDL 4.3 10/26/2012   Lab Results  Component Value Date   HGBA1C 5.6 10/28/2012     Lab Results  Component Value Date   TSH 38.232* 10/25/2012      Studies/Results: No results found.  Medications: I have reviewed the patient's current medications. Scheduled Meds: . aspirin EC  81 mg Oral Daily  . atorvastatin  10 mg Oral q1800  . carvedilol  3.125 mg Oral BID WC  . enoxaparin (LOVENOX) injection  40 mg Subcutaneous Q24H  . ferrous gluconate  324 mg Oral BID WC  . furosemide  40 mg Oral BID  . iron dextran (INFED/DEXFERRUM) infusion  500 mg Intravenous Once  . lisinopril  7.5 mg Oral Daily  . potassium chloride  40 mEq Oral Daily   . sodium chloride  3 mL Intravenous Q12H   Continuous Infusions:  PRN Meds:.sodium chloride, acetaminophen, ondansetron (ZOFRAN) IV, sodium chloride  Assessment/Plan: Principal Problem:   Acute respiratory failure Active Problems:   CHF - Combined Systolic + Diastolic Dysfunction. EF of 15-20% with Grade II diastolic dysfunction on echo 10/27/12   At risk for sudden cardiac death   Non-ischemic cardiomyopathy - EF 15-20%; Cardiac Output ~3.6   Elevated troponin, secondary to CHF   S/P cardiac catheterization, 10/29/12, normal coronaries with minimal luminal irregularities in RCA system   Anemia-microcytic   Accelerated hypertension   Uterine fibroid   Abnormal thyroid function test  PLAN: Thyroid prob sick euthyroid with normal freeT4. On BB, ACE,.  ? Discharge today.  Life vest is in the room.  Appreciate Dr. Clayborne Dana  Assistance. She will follow up in HF clinic next week.    To receive Iron IV today and she will need follow up with GYN.   Refer to Endo at discharge as well for abnormal TSH.  LOS: 7 days   Time spent with pt. includin MD time:  20 minutes. INGOLD,LAURA R 11/01/2012, 9:37 AM  I have seen and evaluated the patient this PM along with Cecilie Kicks, NP. I agree with  her findings, examination as well as impression recommendations.  Doing well, tolerated ACE-I dose.   Appreciate Dr. Clayborne Dana input & plans.  Will need a anemia & OB-Gyn f/u.   She is otherwise stable for d/c home today -- plan is to f/u @ advanced HF clinic next week.     Leonie Man, M.D., M.S. [facility] [address] [place], [place]  [address]  [phone] Pager # [phone] 11/01/2012 1:32 PM

## 2012-11-01 NOTE — Discharge Instructions (Signed)
Low salt diet  Weigh daily, if weight is greater than 3 pounds in a day or 5 pounds in a week call the heart failure clinic.  Call [facility] if any bleeding, swelling or drainage at cath site.  May shower, no tub baths for 48 hours for groin sticks.  [phone]  Do not smoke any substance or drink alcohol.  These will not help your heart to heal.    You will follow up with the heart failure clinic.      No work until Dr. Haroldine Laws clears. No driving until Dr. Haroldine Laws clears.  No lifting over 8 pounds.  Wear life vest as instructed.  The GYN service will call you with date and time of follow up appt.

## 2012-11-01 NOTE — Plan of Care (Signed)
Problem: Discharge Progression Outcomes Goal: Other Discharge Outcomes/Goals Fitted for life vest.  Pt wore at discharge.  Watched education video for life vest and stated understanding.

## 2012-11-01 NOTE — Consult Note (Addendum)
Advanced Heart Failure Team Consult Note  Referring Physician: Dr. Rennis Golden  Primary Physician:  Primary Cardiologist: Dr. Rennis Golden  Reason for Consultation: New onset heart failure  HPI:    [name] is a 47 y.o. female with history of uncontrolled HTN over the last 10 years who presented to the Urgent Care with progressive dyspnea, 2 pillow orthopnea, and cough.  Pertinent labs included Hgb 6.5, hematocrit 23.9, d-dimer 2.62, ProBNP 1028 and troponin 0.1.  BP 182/109 and HR 111.  EKG showed LVH but no acute changes.  CTA of chest w/o evidence of PE.  Anemia felt to be secondary to chronic blood loss from fibroid uterus as well as iron deficiency (Iron 17, Sat ratios 3).  She underwent transfusion of 2 unites PRBCs.  She was placed on IV Lasix, Coreg, Lisinopril and ASA. A 2D echo was obtained which revealed severe LV dysfunction. EF was estimated at 15-20%. There was diffuse hypokinesis. Grade 2 diastolic dysfunction, mild MR, moderate-severely dilatated LA.  Underwent right and left heart catheterization on Oct 30, 2012: RA 5 RV 35/5 (6) PA 35/19 (26) PCWP 17 Fick CO/CI  3.66/2.33 Thermo CO/CI 3.57/2.27 PA O2 57% FA O2 97% Angiographic normal coronary arteries with only minimal luminal irregularities in the RCA system  Currently on Carvedilol 3.125 mg BID, Lasix 40 mg BID, Lisinopril 7.5 mg daily and Kdur 40 mEq daily.  She has diuresed 7 pounds wit  discharge weight 116 pounds.      Review of Systems: [y] = yes, [ ]  = no   General: Weight gain [ ] ; Weight loss [ ] ; Anorexia [ ] ; Fatigue [ ] ; Fever [ ] ; Chills [ ] ; Weakness [ ]   Cardiac: Chest pain/pressure [ ] ; Resting SOB [ ] ; Exertional SOB [ ] ; Orthopnea [ ] ; Pedal Edema [ ] ; Palpitations [ ] ; Syncope [ ] ; Presyncope [ ] ; Paroxysmal nocturnal dyspnea[ ]   Pulmonary: Cough [ ] ; Wheezing[ ] ; Hemoptysis[ ] ; Sputum [ ] ; Snoring [ ]   GI: Vomiting[ ] ; Dysphagia[ ] ; Melena[ ] ; Hematochezia [ ] ; Heartburn[ ] ; Abdominal pain [ ] ; Constipation [  ]; Diarrhea [ ] ; BRBPR [ ]   GU: Hematuria[ ] ; Dysuria [ ] ; Nocturia[ ]   Vascular: Pain in legs with walking [ ] ; Pain in feet with lying flat [ ] ; Non-healing sores [ ] ; Stroke [ ] ; TIA [ ] ; Slurred speech [ ] ;  Neuro: Headaches[ ] ; Vertigo[ ] ; Seizures[ ] ; Paresthesias[ ] ;Blurred vision [ ] ; Diplopia [ ] ; Vision changes [ ]   Ortho/Skin: Arthritis [ ] ; Joint pain [ ] ; Muscle pain [ ] ; Joint swelling [ ] ; Back Pain [ ] ; Rash [ ]   Psych: Depression[ ] ; Anxiety[ ]   Heme: Bleeding problems [ ] ; Clotting disorders [ ] ; Anemia [ ]   Endocrine: Diabetes [ ] ; Thyroid dysfunction[ ]   Home Medications Prior to Admission medications   Medication Sig Start Date End Date Taking? Authorizing Provider  oxymetazoline (AFRIN) 0.05 % nasal spray Place 2 sprays into the nose 2 (two) times daily.   Yes Historical Provider, MD  Phenol-Glycerin (VICKS 44 SORE THROAT MT) Take 15 mLs by mouth every 6 (six) hours as needed (cough).    Yes Historical Provider, MD    Past Medical History: Past Medical History  Diagnosis Date  . Hypertension   . At risk for sudden cardiac death Oct 30, 2012  . CHF - Combined Systolic + Diastolic Dysfunction. EF of 15-20% with Grade II diastolic dysfunction on echo 10/27/12 10/28/2012  . S/P cardiac catheterization, 10-30-2012, normal coronaries with minimal luminal irregularities  in RCA system 10/30/2012    Past Surgical History: History reviewed. No pertinent past surgical history.  Family History: History reviewed. No pertinent family history.  Social History: History   Social History  . Marital Status: Single    Spouse Name: N/A    Number of Children: N/A  . Years of Education: N/A   Social History Main Topics  . Smoking status: Former Smoker    Types: Cigars    Quit date: 10/24/2012  . Smokeless tobacco: Never Used  . Alcohol Use: None  . Drug Use: None  . Sexually Active: None   Other Topics Concern  . None   Social History Narrative  . None    Allergies:   Allergies  Allergen Reactions  . Codeine Hives    Objective:    Vital Signs:   Temp:  [97.8 F (36.6 C)-98.2 F (36.8 C)] 98.2 F (36.8 C) (03/31 0600) Pulse Rate:  [73-94] 75 (03/31 0600) Resp:  [18] 18 (03/31 0600) BP: (109-131)/(72-86) 129/83 mmHg (03/31 0600) SpO2:  [100 %] 100 % (03/31 0600) Weight:  [116 lb (52.617 kg)] 116 lb (52.617 kg) (03/31 0600) Last BM Date: 10/31/12  Weight change: Filed Weights   10/30/12 0542 10/31/12 0604 11/01/12 0600  Weight: 117 lb 14.4 oz (53.479 kg) 118 lb 1.6 oz (53.57 kg) 116 lb (52.617 kg)    Intake/Output:   Intake/Output Summary (Last 24 hours) at 11/01/12 0808 Last data filed at 11/01/12 0602  Gross per 24 hour  Intake    840 ml  Output   1050 ml  Net   -210 ml     Physical Exam: General:  Chronically ill appearing. Pale. Thin.  No resp difficulty HEENT: normal Neck: supple. JVP flat . Carotids 2+ bilat; no bruits. No lymphadenopathy or thryomegaly appreciated. Cor: PMI nondisplaced. Regular rate & rhythm. No rubs, gallops or murmurs. Lungs: clear Abdomen: soft, nontender, nondistended. No hepatosplenomegaly. No bruits or masses. Good bowel sounds. Extremities: no cyanosis, clubbing, rash, edema Neuro: alert & orientedx3, cranial nerves grossly intact. moves all 4 extremities w/o difficulty. Affect pleasant   Labs: Basic Metabolic Panel:  Recent Labs Lab 10/28/12 0440 10/29/12 0700 10/29/12 1708 10/30/12 1029 10/31/12 0535 11/01/12 0555  NA 136 136  --  136 137 139  K 4.1 4.2  --  4.1 4.1 3.8  CL 102 101  --  102 101 103  CO2 24 24  --  24 24 26   GLUCOSE 80 91  --  87 90 89  BUN 19 18  --  19 13 13   CREATININE 0.67 0.73 0.59 0.65 0.63 0.60  CALCIUM 9.4 9.8  --  9.5 9.3 9.4    Liver Function Tests:  Recent Labs Lab 10/25/12 1306  AST 34  ALT 13  ALKPHOS 72  BILITOT 0.2*  PROT 7.7  ALBUMIN 2.9*   No results found for this basename: LIPASE, AMYLASE,  in the last 168 hours No results found for  this basename: AMMONIA,  in the last 168 hours  CBC:  Recent Labs Lab 10/25/12 1306 10/26/12 0518 10/27/12 0410 10/29/12 0700 10/29/12 1708  WBC 6.3  --  9.7 7.1 5.8  NEUTROABS 4.6  --   --   --   --   HGB 6.5* 10.0* 9.6* 10.5* 10.3*  HCT 23.9* 32.7* 32.8* 37.1 34.9*  MCV 63.4*  --  68.9* 71.9* 70.4*  PLT 223  --  194 246 199    Cardiac Enzymes:  Recent Labs Lab  10/25/12 1606 10/25/12 1748 10/26/12 0518 10/26/12 1006 10/26/12 2023 10/27/12 0410 10/28/12 0440  CKTOTAL  --  364* 274* 292*  --   --   --   CKMB  --  10.0* 7.1* 7.1*  --   --   --   TROPONINI <0.30 <0.30 <0.30 0.44* 0.33* 0.46* 0.38*    BNP: BNP (last 3 results)  Recent Labs  10/25/12 1306 10/28/12 1439 10/30/12 1029  PROBNP 3831.0* 1028.0* 491.2*    CBG:  Recent Labs Lab 10/27/12 2121  GLUCAP 126*      Medications:     Current Medications: . aspirin EC  81 mg Oral Daily  . atorvastatin  10 mg Oral q1800  . carvedilol  3.125 mg Oral BID WC  . enoxaparin (LOVENOX) injection  40 mg Subcutaneous Q24H  . ferrous gluconate  324 mg Oral BID WC  . furosemide  40 mg Oral BID  . lisinopril  7.5 mg Oral Daily  . potassium chloride  40 mEq Oral Daily  . sodium chloride  3 mL Intravenous Q12H     Infusions:      Assessment:   1. Acute systolic heart failure 2. NICM, EF 15-20% 3. HTN 4. Microcytic anemia, iron deficient     -s/p 2 units of PRBCs     -iron started this admission 5. Elevated TSH with low normal free T4 6. Dysfunctional uterine bleeding   Length of Stay: 7  Robbi Garter, [facility] 11/01/2012, 8:08 AM    Plan/Discussion:     Patient seen and examined with Ulyess Blossom, PA-C. We discussed all aspects of the encounter. I agree with the assessment as stated above. My thoughts below.  Interesting case. I remain a bit puzzled about the etiology of her cardiomyopathy. She states she was diagnosed with severe HTN 10 years ago by Dr. Shana Chute. She was started on meds  but then stopped. She has not followed with any doctors but says every time she checks BP at [facility] it is well controlled. There is no LVH on echo to support longstanding, untreated HTN.  She has several comorbidities including an elevated TSH with low normal free T4 and she is very anemic with severe iron-deficiency anemia in the setting of peri-menopausal dysfunctional uterine bleeding.  From an HF standpoint, I think her meds are OK. Would not push more aggressively at this point. We debated adding spiro and cutting back on lasix but she already has Miranda. Dash at her bedside and I worry about hyperkalemia. We will make her an appt to f/u in HF clinic next week.  From a more global perspecitve we will give her an extra dose of IV iron and would have her f/u with GYN soon after discharge. We will also check HIV and hepatitis serologies. Will need to follow thyroid studies and may be worth a referral to endocrine at some point.   Thanks for consult.   Junice Fei,MD 9:46 AM

## 2012-11-01 NOTE — Discharge Summary (Signed)
Physician Discharge Summary  Patient ID: Miranda [name] MRN: [id number] DOB/AGE: Jul 24, 1966 47 y.o.  Admit date: 10/25/2012 Discharge date: 11/01/2012  Discharge Diagnoses:  Principal Problem:   Acute respiratory failure, resolved at discharge Active Problems:   CHF - Combined Systolic + Diastolic Dysfunction. EF of 15-20% with Grade II diastolic dysfunction on echo 10/27/12   At risk for sudden cardiac death   Non-ischemic cardiomyopathy - EF 15-20%; Cardiac Output ~3.6   Elevated troponin, secondary to CHF   S/P cardiac catheterization, 10/29/12, normal coronaries with minimal luminal irregularities in RCA system   Anemia-microcytic, receiving 2 units PRBC, IV iron    Accelerated hypertension   Uterine fibroid   Abnormal thyroid function test   Acute systolic heart failure   Discharged Condition: good   Procedures: 10/29/2012 Rt. And Lt heart cath by Dr. Margaree Mackintosh Course:  47 year old female with history of hypertension (has not been taking medication for last 10 years) was sent from the [facility] urgent care facility for worsening shortness of breath 10/25/12 to [facility] ER.  History was obtained from the patient who stated that she had noticed increased shortness of breath with exertion for last 3 days since Friday. She had 2 pillow orthopnea and was unable to sleep night before admit due to shortness of breath. She denied any chest pain. She has had history of hypertension but stated that she had kept it down herself and was not taking medications for last 10 years. Otherwise she denied any chest pain, any fever chills or productive cough. She had been coughing some due to congestion. She denied any abdominal pain nausea, vomiting or any hematemesis.  ED workup showed severe anemia with hemoglobin of 6.5, hematocrit 23.9, troponin positive at 0.1, elevated d-dimer at 2.62. At the time of triage BP was 182/109, HR 111, at the time of my encounter, HR is in 120-130's, sinus tachycardia,  BP 171/101. CT angiogram chest had been done with negative pul. Embolus, positive for CHF and bilateral pl. Effusions.  Also mild bilateral axillary adenopathy.   Patient also reported that she has irregular menstrual periods, last month had about 9-10 days long period with clots, today she had "little". Patient also has not seen a PCP or GYN MD in years.   She was admitted, transfused 2 units of PRBCs.  Echo was done and found to have EF of 15-20% with Grade 2 diastolic dysfunction and mild MR with mod. To severe LA dilatation, sm post. Pericardial effusion.  Cardiology consulted.  She was diuresed, and coreg, ACE and ASA added.   Rt. And Lt heart cath recommended which she underwent. No CAD-NICM.   Pt improved and continued to improve with titration of medication.  PO Iron was added.  TSH was elevated but normal Free T4.    Due to high risk for sudden cardiac death Life vest was arranged and she had at discharge.  Also consult with Heart Failure team was arranged.  She will follow up with th HF team next week.    For her prolonged menstrual periods we arranged for the GYN service to contact the pt. For outpatient appt.  The pt does not have a GYN provider.  Prior to discharge she was given IV Iron.  She was seen by Dr. Herbie Baltimore and found to be stable for discharge with follow up next week with HF clinic.  They will assume her care.      Consults: Heart Failure Team with Dr. Gala Romney  and Cardiology  with [facility].  Significant Diagnostic Studies:  BMET    Component Value Date/Time   NA 139 11/01/2012 0555   K 3.8 11/01/2012 0555   CL 103 11/01/2012 0555   CO2 26 11/01/2012 0555   GLUCOSE 89 11/01/2012 0555   BUN 13 11/01/2012 0555   CREATININE 0.60 11/01/2012 0555   CALCIUM 9.4 11/01/2012 0555   GFRNONAA >90 11/01/2012 0555   GFRAA >90 11/01/2012 0555   LDH  318, was 334 on admit  Pro BNP  491 down from 3831 on admit  PK Troponin 0.44   Neg preg. Test   Vit B12  546  Tot. Chol 177, TG 115, HDL 41, LDL 113  Hgb A1C 5.6  Free T4 0.95  TSH 38.2  Iron 17, UIBC 474, TIBC 491, Sat ratio 3, Ferritin 4, Folate 16.9 Haptoglobin 223 D Dimer 2.62 with neg. CTA  CBC    Component Value Date/Time   WBC 5.8 10/29/2012 1708   RBC 4.96 10/29/2012 1708   HGB 10.3* 10/29/2012 1708   HCT 34.9* 10/29/2012 1708   PLT 199 10/29/2012 1708   MCV 70.4* 10/29/2012 1708   MCH 20.8* 10/29/2012 1708   MCHC 29.5* 10/29/2012 1708   RDW 27.9* 10/29/2012 1708   LYMPHSABS 1.4 10/25/2012 1306   MONOABS 0.3 10/25/2012 1306   EOSABS 0.0 10/25/2012 1306   BASOSABS 0.0 10/25/2012 1306   H/H up from 6.5/23.9 on admit after 2 units PRBCs.   Stool negative hemoccult ANA Pending  CTA of Chest 10/26/12: 1. Cardiomegaly and changes of congestive heart failure with  bilateral pleural effusions.  2. Mild bibasilar atelectasis without definite pneumonia.  3. Small pericardial effusion.  4. Mild bilateral axillary adenopathy.  5. No pulmonary emboli.   2D Echo Left ventricle: The cavity size was moderately dilated. The estimated ejection fraction was in the range of 15% to 20%. Diffuse hypokinesis. Features are consistent with a pseudonormal left ventricular filling pattern, with concomitant abnormal relaxation and increased filling pressure (grade 2 diastolic dysfunction). Doppler parameters are consistent with both elevated ventricular end-diastolic filling pressure and elevated left atrial filling pressure. - Mitral valve: Mild regurgitation. - Left atrium: The atrium was moderately to severely dilated. - Atrial septum: No defect or patent foramen ovale was identified. - Pericardium, extracardiac: A small pericardial effusion was identified circumferential to the heart measuring 0.676 cm posteriorly. There was no evidence of hemodynamic compromise.  Cardiac Cath Rt. And Lt. POST-OPERATIVE DIAGNOSIS:  Severe nonischemic dilated cardiomyopathy with ejection fraction  roughly 15% that correlates well with cardiac output of roughly 3.6 tablet Fick and thermal dilution.  Mildly elevated LVEDP that correlates well with the pulmonary tablet wedge pressure of roughly 20-23 mmHg, with essentially normal right ventricular pressures.  Angiographic normal coronary arteries with only minimal luminal irregularities in the RCA system.  FINDINGS:  Hemodynamics:  Findings:   SaO2%  Pressures mmHg  Mean P  mmHg  EDP  mmHg   Right Atrium   7/5   5   Right Ventricle   35/5 ; 47/7 without wedge pillow   6; 10   Pulmonary Artery  57%  35/19  26    PCWP   19/21 ; 20/26  17; 23    Central Aortic  97%  136/91  110    Left Ventricle   136/15   20-24          Cardiac Output:   Cardiac Index:    Fick  3.66  2.33    Thermodilution  3.57   2.27        Discharge Exam: Blood pressure 110/66, pulse 74, temperature 97.6 F (36.4 C), temperature source Oral, resp. rate 18, height 5\' 4"  (1.626 m), weight 52.617 kg (116 lb), SpO2 100.00%.  PK weight 56.1 Kg (123 lb 10 oz)  AM exam: PE: General:alert and oriented, pleasant affect,  Neck:no JVD  Heart:S1S2 RRR no obvious murmur  Lungs:clear without rales, rhonchi or wheezes  Abd:+ BS, soft, non tender  Ext:no edema. + pedal pulses  Disposition: [facility] Not Defined       Future Appointments Provider Department Dept Phone   11/09/2012 9:00 AM [facility] [phone]       Medication List    TAKE these medications       acetaminophen 325 MG tablet  Commonly known as:  TYLENOL  Take 2 tablets (650 mg total) by mouth every 4 (four) hours as needed.     aspirin 81 MG EC tablet  Take 1 tablet (81 mg total) by mouth daily.     atorvastatin 10 MG tablet  Commonly known as:  LIPITOR  Take 1 tablet (10 mg total) by mouth daily at 6 PM.     carvedilol 3.125 MG tablet  Commonly known as:  COREG  Take 1 tablet (3.125 mg total) by mouth  2 (two) times daily with a meal.     ferrous gluconate 324 MG tablet  Commonly known as:  FERGON  Take 1 tablet (324 mg total) by mouth 2 (two) times daily with a meal.     furosemide 40 MG tablet  Commonly known as:  LASIX  Take 1 tablet (40 mg total) by mouth 2 (two) times daily.     lisinopril 2.5 MG tablet  Commonly known as:  PRINIVIL,ZESTRIL  Take 3 tablets (7.5 mg total) by mouth daily.     oxymetazoline 0.05 % nasal spray  Commonly known as:  AFRIN  Place 2 sprays into the nose 2 (two) times daily.     potassium chloride SA 20 MEQ tablet  Commonly known as:  K-DUR,KLOR-CON  Take 2 tablets (40 mEq total) by mouth daily.     VICKS 44 SORE THROAT MT  Take 15 mLs by mouth every 6 (six) hours as needed (cough).       Follow-up Information   Follow up with Arvilla Meres, MD On 11/09/2012. (9:00  [facility] Code [id number]))    Contact information:   [address] [place] [address] [phone]     Discharge Instructions: Low salt diet  Weigh daily, if weight is greater than 3 pounds in a day or 5 pounds in a week call the heart failure clinic.  Call [facility] if any bleeding, swelling or drainage at cath site.  May shower, no tub baths for 48 hours for groin sticks.  [phone])  Do not smoke any substance or drink alcohol.  These will not help your heart to heal.    You will follow up with the heart failure clinic.      No work until Dr. Gala Romney clears. No driving until Dr. Gala Romney clears.  No lifting over 8 pounds.  Wear life vest as instructed. The GYN service will call you with date and time of follow up appt.  SignedNada Boozer R 11/01/2012, 3:23 PM  Time spent on discharge >30  minutes.

## 2012-11-01 NOTE — Discharge Summary (Signed)
Ms. Koplin looked & felt great today.   Currently getting IV Iron ordered by Dr. Gala Romney, who has graciously agreed to take over her CHF care in the OP setting.  LifeVest present.  Will need anemia & GYN evaluation per Dr. Gala Romney.  Would not push more aggressively at this point.  Will f/u @ HF clinic next week  Will need Thyroid studies f/u as d/c -- may be worth a referral to endocrine at some point  Plan is to check HIV and hepatitis serologies.    She is ready for d/c after IV Iron.  Marykay Lex, M.D., M.S. [facility] [address] [place], [place]  [address]  [phone] Pager # [phone] 11/01/2012 5:29 PM

## 2012-11-01 NOTE — Progress Notes (Signed)
Physical Therapy Treatment/Discharge Summary Patient Details Name: Miranda [name] MRN: [id number] DOB: 1966/02/26 Today's Date: 11/01/2012 Time: 4098-1191 PT Time Calculation (min): 6 min  PT Assessment / Plan / Recommendation Comments on Treatment Session  Patient has progressed to independent hallway ambulation.  Has met PT goals.  No further acute PT needs.  No follow up recommendations at this time unless stable for outpatient cardiac rehab.    Follow Up Recommendations  No PT follow up           Equipment Recommendations  None recommended by PT          Plan Discharge plan remains appropriate    Precautions / Restrictions Precautions Precautions: None   Pertinent Vitals/Pain Denies pain    Mobility  Ambulation/Gait Ambulation/Gait Assistance Details: mobility not performed this session.  Observed patient in hallway today ambulating unassisted without loss of balance or difficulty    Exercises Other Exercises Other Exercises: educated in stopping to rest when feels tired and in limiting number of trips on stairs at home.  states had visit from [facility] liason about [facility] services being set up for her (she thought they were coming for her mother.)     PT Goals Acute Rehab PT Goals Pt will go Stand to Sit: Independently PT Goal: Stand to Sit - Progress: Met Pt will Stand: Independently PT Goal: Ambulate - Progress: Met Pt will Go Up / Down Stairs: Flight;with rail(s);with supervision PT Goal: Up/Down Stairs - Progress: Met Pt will Perform Home Exercise Program: Independently PT Goal: Perform Home Exercise Program - Progress: Met  Visit Information  Last PT Received On: 11/01/12    Subjective Data  Subjective: Walking the halls on my own   Cognition  Cognition Overall Cognitive Status: Appears within functional limits for tasks assessed/performed Arousal/Alertness: Awake/alert Orientation Level: Appears intact for tasks assessed Behavior During Session: [facility] for  tasks performed    Balance     End of Session PT - End of Session Activity Tolerance: Other (comment) (tolerating independent walks in hallway ) Patient left: in bed   GP     [facility] 11/01/2012, 2:59 PM [place], PT [phone] 11/01/2012

## 2012-11-01 NOTE — Progress Notes (Signed)
Reviewed HF with pt. Printed more low sodium diets that list milligrams for each food. Pt with appropriate questions. Discussed CRPII but pt not interested at this time. [phone] Ethelda Chick CES, ACSM 11/01/2012 12:07 PM

## 2012-11-01 NOTE — Care Management Note (Unsigned)
    Page 1 of 1   11/01/2012     10:46:54 AM   CARE MANAGEMENT NOTE 11/01/2012  Patient:  Miranda [name], Miranda [name]   Account Number:  [id number]  Date Initiated:  10/26/2012  Documentation initiated by:  Johny Shock  Subjective/Objective Assessment:   Consult for CHF screening.     Action/Plan:   3/25 CM will follow for d/c needs and discuss with pt when pt more stable.   Anticipated DC Date:  11/01/2012   Anticipated DC Plan:  HOME W HOME HEALTH SERVICES      DC Planning Services  CM consult      [facility] Choice  HOME HEALTH   Choice offered to / List presented to:  C-1 Patient        HH arranged  HH-10 DISEASE MANAGEMENT  HH-1 RN      Status of service:  In process, will continue to follow Medicare Important Message given?   (If response is "NO", the following Medicare IM given date fields will be blank) Date Medicare IM given:   Date Additional Medicare IM given:    Discharge Disposition:    Per UR Regulation:    If discussed at Long Length of Stay Meetings, dates discussed:    Comments:  11/01/12 Oletta Cohn, RN, BSN, NCM [phone] Neomia Dear pt regarding discharge planning.  Offered pt list of Home Health agencies in [place].  Pt chose [facility] to render services.Darlin Drop of [facility] notified.

## 2012-11-01 NOTE — Progress Notes (Signed)
Patient discharged for home via vehicle transport by father and brother.  Pt put life vest on and stated understanding.  I assessed knowledge base earlier in shift and she watched educational video.  Map given for f/u appt to HF clinic.

## 2012-11-02 LAB — HEPATITIS PANEL, ACUTE
HCV Ab: NEGATIVE
Hep A IgM: NEGATIVE
Hep B C IgM: NEGATIVE
Hepatitis B Surface Ag: NEGATIVE

## 2012-11-02 LAB — ANTI-NUCLEAR AB-TITER (ANA TITER): ANA Titer 1: 1:160 {titer} — ABNORMAL HIGH

## 2012-11-02 LAB — ANA: Anti Nuclear Antibody(ANA): POSITIVE — AB

## 2012-11-09 ENCOUNTER — Ambulatory Visit (HOSPITAL_COMMUNITY)
Admit: 2012-11-09 | Discharge: 2012-11-09 | Disposition: A | Discharge status: Home / Self Care | Payer: 59 | Attending: Internal Medicine | Admitting: Internal Medicine

## 2012-11-09 VITALS — BP 106/66 | HR 78 | Wt 122.5 lb

## 2012-11-09 DIAGNOSIS — I5042 Chronic combined systolic (congestive) and diastolic (congestive) heart failure: Secondary | ICD-10-CM | POA: Insufficient documentation

## 2012-11-09 DIAGNOSIS — I509 Heart failure, unspecified: Secondary | ICD-10-CM | POA: Insufficient documentation

## 2012-11-09 LAB — BASIC METABOLIC PANEL
BUN: 18 mg/dL (ref 6–23)
CO2: 28 mEq/L (ref 19–32)
Calcium: 9.7 mg/dL (ref 8.4–10.5)
Chloride: 102 mEq/L (ref 96–112)
Creatinine, Ser: 0.6 mg/dL (ref 0.50–1.10)
GFR calc Af Amer: >90 mL/min (ref >90)
GFR calc non Af Amer: >90 mL/min (ref >90)
Glucose, Bld: 77 mg/dL (ref 70–99)
Potassium: 4.1 mEq/L (ref 3.5–5.1)
Sodium: 138 mEq/L (ref 135–145)

## 2012-11-09 LAB — MAGNESIUM: Magnesium: 1.7 mg/dL (ref 1.5–2.5)

## 2012-11-09 MED ORDER — CARVEDILOL 3.125 MG PO TABS: 6.2500 mg | ORAL_TABLET | Freq: Two times a day (BID) | ORAL | Status: DC

## 2012-11-09 NOTE — Patient Instructions (Addendum)
Today through Friday (4/11): Carvedilol 1 tablet in the morning and 2 tablets in the evening  Saturday Morning change to carvedilol 2 tablets in the morning and in the evening  Labs today.  Follow up 2-3 weeks.

## 2012-11-09 NOTE — Progress Notes (Addendum)
HPI: Miranda [name] is a 47 y.o. female with history of HTN, NICM with EF 0000000, chronic systolic and diastolic heart failure and iron deficiency anemia.  She also has a fibroid uterus.    She had not followed up with a physician in ~ 10 years and presented to the Urgent Care with progressive dyspnea, 2 pillow orthopnea, and cough. Pertinent labs included Hgb 6.5, hematocrit 23.9, d-dimer 2.62, ProBNP 1028 and troponin 0.1. BP 182/109 and HR 111. EKG showed LVH but no acute changes. CTA of chest w/o evidence of PE. Anemia felt to be secondary to chronic blood loss from fibroid uterus as well as iron deficiency (Iron 17, Sat ratios 3). She was transferred to [facility] and underwent transfusion of 2 units PRBCs. She was placed on IV Lasix, Coreg, Lisinopril and ASA. A 2D echo was obtained which revealed severe LV dysfunction.   (10/26/12) EF was estimated at 15-20%. There was diffuse hypokinesis. Grade 2 diastolic dysfunction, mild MR, moderate-severely dilatated LA.   Underwent right and left heart catheterization on 2012-11-15:  RA 5  RV 35/5 (6)  PA 35/19 (26)  PCWP 17  Fick CO/CI 3.66/2.33  Thermo CO/CI 3.57/2.27  PA O2 57%  FA O2 97%  Angiographic normal coronary arteries with only minimal luminal irregularities in the RCA system  Discharged on Carvedilol 3.125 mg BID, Lasix 40 mg BID, Lisinopril 7.5 mg daily and Kdur 40 mEq daily. She diuresed 7 pounds with discharge weight 116 pounds.   She returns for post hospital follow up today.  She feels ok.  She is walking 15 mins several times a day around her complex, this includes inclines.  She also has steps at home that she can climb.  Weight at home is a steady 118-119 pounds.  She denies orthopnea/PND.  No chest pain.  No dizziness or syncope.   She did miss her night time dose of meds last night, she states she is not happy with herself.  Wearing LifeVest, no alarms.   ROS: All systems negative except as listed in HPI, PMH and Problem List.  Past  Medical History  Diagnosis Date  . Hypertension   . At risk for sudden cardiac death 15-Nov-2012  . CHF - Combined Systolic + Diastolic Dysfunction. EF of 15-20% with Grade II diastolic dysfunction on echo 10/27/12 10/28/2012  . S/P cardiac catheterization, 2012/11/15, normal coronaries with minimal luminal irregularities in RCA system 10/30/2012    Current Outpatient Prescriptions  Medication Sig Dispense Refill  . acetaminophen (TYLENOL) 325 MG tablet Take 2 tablets (650 mg total) by mouth every 4 (four) hours as needed.      Marland Kitchen aspirin EC 81 MG EC tablet Take 1 tablet (81 mg total) by mouth daily.      Marland Kitchen atorvastatin (LIPITOR) 10 MG tablet Take 1 tablet (10 mg total) by mouth daily at 6 PM.  30 tablet  6  . carvedilol (COREG) 3.125 MG tablet Take 1 tablet (3.125 mg total) by mouth 2 (two) times daily with a meal.  60 tablet  6  . ferrous gluconate (FERGON) 324 MG tablet Take 1 tablet (324 mg total) by mouth 2 (two) times daily with a meal.  60 tablet  3  . furosemide (LASIX) 40 MG tablet Take 1 tablet (40 mg total) by mouth 2 (two) times daily.  30 tablet  6  . lisinopril (PRINIVIL,ZESTRIL) 2.5 MG tablet Take 3 tablets (7.5 mg total) by mouth daily.  90 tablet  6  . oxymetazoline (AFRIN)  0.05 % nasal spray Place 2 sprays into the nose 2 (two) times daily.      . Phenol-Glycerin (VICKS 44 SORE THROAT MT) Take 15 mLs by mouth every 6 (six) hours as needed (cough).       . potassium chloride SA (K-DUR,KLOR-CON) 20 MEQ tablet Take 2 tablets (40 mEq total) by mouth daily.  60 tablet  6   No current facility-administered medications for this encounter.     PHYSICAL EXAM: Filed Vitals:   11/09/12 0906  BP: 106/66  Pulse: 78  Weight: 122 lb 8 oz (55.566 kg)  SpO2: 100%    General: Pale, Chronically ill appearing. No resp difficulty HEENT: normal Neck: supple. JVP 5-6. Carotids 2+ bilaterally; no bruits. No lymphadenopathy or thryomegaly appreciated. Cor: PMI normal. Regular rate & rhythm. No  rubs, gallops or murmurs. Lungs: clear Abdomen: soft, nontender, nondistended. No hepatosplenomegaly. No bruits or masses. Good bowel sounds. Extremities: no cyanosis, clubbing, rash, edema Neuro: alert & orientedx3, cranial nerves grossly intact. Moves all 4 extremities w/o difficulty. Affect pleasant.   ASSESSMENT & PLAN:

## 2012-11-09 NOTE — Assessment & Plan Note (Signed)
Volume status well controlled.  NYHA II.  Will titrate carvedilol to 3.125/6.25 mg for 4 days and if she tolerates this increase will go to 6.25 mg BID on Saturday, she voices understanding.  Will continue lisinopril and lasix.  Have discussed use of sliding scale lasix for weight gain of 3 pounds or more, she voices understanding.  Would like to add spiro but she is using a lot of Ms Sharilyn Sites so will await BMET today and consider adding next follow up.  She is doing well with reading labels and following low sodium diet.  She is going to consider coming to the diet class offered by the HF dietician.  Follow up 2-3 weeks for further medication titration.  Continue LifeVest at this time, will reassess EF in 3 months after med titration.

## 2012-11-12 ENCOUNTER — Other Ambulatory Visit (HOSPITAL_COMMUNITY): Payer: Self-pay | Admitting: *Deleted

## 2012-11-12 MED ORDER — CARVEDILOL 6.25 MG PO TABS: 6.2500 mg | ORAL_TABLET | Freq: Two times a day (BID) | ORAL | Status: DC

## 2012-11-12 MED ORDER — FUROSEMIDE 40 MG PO TABS: 40.0000 mg | ORAL_TABLET | Freq: Two times a day (BID) | ORAL | Status: DC

## 2012-11-24 ENCOUNTER — Ambulatory Visit (INDEPENDENT_AMBULATORY_CARE_PROVIDER_SITE_OTHER): Payer: 59 | Admitting: Family Medicine

## 2012-11-24 ENCOUNTER — Other Ambulatory Visit (HOSPITAL_COMMUNITY)
Admission: RE | Admit: 2012-11-24 | Discharge: 2012-11-24 | Disposition: A | Discharge status: Home / Self Care | Payer: 59 | Source: Ambulatory Visit | Attending: Family Medicine | Admitting: Family Medicine

## 2012-11-24 ENCOUNTER — Encounter: Payer: Self-pay | Admitting: Family Medicine

## 2012-11-24 VITALS — BP 131/80 | HR 92 | Ht 64.5 in | Wt 127.1 lb

## 2012-11-24 DIAGNOSIS — D259 Leiomyoma of uterus, unspecified: Secondary | ICD-10-CM | POA: Insufficient documentation

## 2012-11-24 DIAGNOSIS — D649 Anemia, unspecified: Secondary | ICD-10-CM

## 2012-11-24 LAB — T4, FREE: Free T4: 0.58 ng/dL — ABNORMAL LOW (ref 0.80–1.80)

## 2012-11-24 LAB — POCT PREGNANCY, URINE: Preg Test, Ur: NEGATIVE

## 2012-11-24 LAB — T3, FREE: T3, Free: 2.3 pg/mL (ref 2.3–4.2)

## 2012-11-24 LAB — TSH: TSH: 7.824 u[IU]/mL — ABNORMAL HIGH (ref 0.350–4.500)

## 2012-11-24 LAB — FOLLICLE STIMULATING HORMONE: FSH: 18.2 m[IU]/mL

## 2012-11-24 NOTE — Progress Notes (Signed)
Subjective:    Patient ID: Miranda [name], female    DOB: 07-Mar-1966, 47 y.o.   MRN: [id number]  HPI Pt. Is a [id number] who was recently admitted with CHF, + troponin, diastolic and systolic dysfunction, EF 25%.  Found to be markedly anemic, with fibroid uterus.  Reports some heavy cycles, which have been irregular over the last 6-8 months.  Cycles have clots and bleeding x 3-4 days but has been as long as 9 days.  She is now 3 months without cycle.  + hot flashes.  Required transfusion.  Recently quit smoking.  Here for further eval.  Hemoccult neg. Past Medical History  Diagnosis Date  . Hypertension   . At risk for sudden cardiac death 13-Nov-2012  . CHF - Combined Systolic + Diastolic Dysfunction. EF of 15-20% with Grade II diastolic dysfunction on echo 10/27/12 10/28/2012  . S/P cardiac catheterization, 11/13/2012, normal coronaries with minimal luminal irregularities in RCA system 10/30/2012   History reviewed. No pertinent past surgical history. Allergies  Allergen Reactions  . Codeine Hives   Current Outpatient Prescriptions on File Prior to Visit  Medication Sig Dispense Refill  . acetaminophen (TYLENOL) 325 MG tablet Take 2 tablets (650 mg total) by mouth every 4 (four) hours as needed.      Marland Kitchen aspirin EC 81 MG EC tablet Take 1 tablet (81 mg total) by mouth daily.      Marland Kitchen atorvastatin (LIPITOR) 10 MG tablet Take 1 tablet (10 mg total) by mouth daily at 6 PM.  30 tablet  6  . carvedilol (COREG) 6.25 MG tablet Take 1 tablet (6.25 mg total) by mouth 2 (two) times daily with a meal.  60 tablet  6  . ferrous gluconate (FERGON) 324 MG tablet Take 1 tablet (324 mg total) by mouth 2 (two) times daily with a meal.  60 tablet  3  . furosemide (LASIX) 40 MG tablet Take 1 tablet (40 mg total) by mouth 2 (two) times daily.  60 tablet  6  . lisinopril (PRINIVIL,ZESTRIL) 2.5 MG tablet Take 3 tablets (7.5 mg total) by mouth daily.  90 tablet  6  . potassium chloride SA (K-DUR,KLOR-CON) 20 MEQ tablet Take 2  tablets (40 mEq total) by mouth daily.  60 tablet  6   No current facility-administered medications on file prior to visit.   Family History  Problem Relation Age of Onset  . Cancer Paternal Grandfather     Bone  . Hypertension Maternal Grandmother   . Hypertension Mother   . Breast cancer Cousin   . Cancer Maternal Uncle     bone   History   Social History  . Marital Status: Single    Spouse Name: N/A    Number of Children: N/A  . Years of Education: N/A   Occupational History  . Not on file.   Social History Main Topics  . Smoking status: Former Smoker    Types: Cigars    Quit date: 10/24/2012  . Smokeless tobacco: Never Used  . Alcohol Use: No  . Drug Use: No  . Sexually Active: Not Currently   Other Topics Concern  . Not on file   Social History Narrative  . No narrative on file      Review of Systems  Constitutional: Positive for unexpected weight change.  HENT: Negative for congestion.   Respiratory: Positive for shortness of breath.   Cardiovascular: Negative for leg swelling.  Gastrointestinal: Negative for abdominal pain.  Endocrine: Negative for polydipsia and  polyphagia.  Genitourinary: Positive for vaginal bleeding.  Musculoskeletal: Negative for back pain.  Skin: Negative for rash.       Objective:   Physical Exam  Vitals reviewed. Constitutional: She appears well-developed.  HENT:  Head: Normocephalic and atraumatic.  Eyes: No scleral icterus.  Abdominal: Soft.  Genitourinary:  NEFG, BUS WNL some vaginal atrophy, cervix is multiparous without lesion.  Uterus is firm, 10 wk size. Adnexa are without mass or tenderness.   US Transvaginal Non-ob  10/27/2012  *RADIOLOGY REPORT*  Clinical Data: Irregular menstrual cycle.  TRANSABDOMINAL AND TRANSVAGINAL ULTRASOUND OF PELVIS Technique:  Both transabdominal and transvaginal ultrasound examinations of the pelvis were performed. Transabdominal technique was performed for global imaging of the  pelvis including uterus, ovaries, adnexal regions, and pelvic cul-de-sac.  It was necessary to proceed with endovaginal exam following the transabdominal exam to visualize the endometrium.  Comparison:  None  Findings:  Uterus: 8.1 x 5.3 x 6.4 cm.  Prominent fibroid in the central portion of the body of the uterus, possibly within the endometrial cavity.  It measures 3.4 x 3.9 x 3.6 cm.  There is also a 2.7 x 1.9 x 2.3 cm posterior fibroid in the body of the uterus.  Endometrium: Endometrium is completely obscured by the large central fibroid.  Right ovary:  Not visualized transabdominally or transvaginally.  Left ovary: Normal.  2.4 x 1.8 x 1.7 cm.  Other findings: No free fluid  IMPRESSION: 3.9 cm fibroid which appears to be within the endometrial cavity. This could be a polypoid fibroid or a submucosal fibroid. It obscures the endometrium.  Nonvisualization of the right ovary.   Original Report Authenticated By: Francene Boyers, M.D.      Procedure:  Patient given informed consent, signed copy in the chart, time out was performed. Appropriate time out taken. . The patient was placed in the lithotomy position and the cervix brought into view with sterile speculum.  Portio of cervix cleansed x 2 with betadine swabs.  A tenaculum was placed in the anterior lip of the cervix.  The uterus was sounded for depth of 7 cm. A pipelle was introduced to into the uterus, suction created,  and an endometrial sample was obtained. All equipment was removed and accounted for.  The patient tolerated the procedure well.    Patient given post procedure instructions. The patient will return in 2 weeks for results.      Assessment & Plan:

## 2012-11-24 NOTE — Assessment & Plan Note (Signed)
Probably from menorrhagia which may be related to hormones-elevated TSH in hospital, or peri-menopause or fibroids.  Will check TSH, FSH, Free T 3 and Free T4.

## 2012-11-24 NOTE — Assessment & Plan Note (Signed)
Should be hormonally active-->if menopausal, should shrink and fail to be further problem.  She is not a good surgical candidate but may require treatment and would be a good candidate for uterine artery embolization.

## 2012-11-24 NOTE — Patient Instructions (Signed)
Menopause Menopause is the normal time of life when menstrual periods stop completely. Menopause is complete when you have missed 12 consecutive menstrual periods. It usually occurs between the ages of 48 to 55, with an average age of 51. Very rarely does a woman develop menopause before 49 years old. At menopause, your ovaries stop producing the female hormones, estrogen and progesterone. This can cause undesirable symptoms and also affect your health. Sometimes the symptoms may occur 4 to 5 years before the menopause begins. There is no relationship between menopause and:  Oral contraceptives.  Number of children you had.  Race.  The age your menstrual periods started (menarche). Heavy smokers and very thin women may develop menopause earlier in life. CAUSES  The ovaries stop producing the female hormones estrogen and progesterone.  Other causes include:  Surgery to remove both ovaries.  The ovaries stop functioning for no known reason.  Tumors of the pituitary gland in the brain.  Medical disease that affects the ovaries and hormone production.  Radiation treatment to the abdomen or pelvis.  Chemotherapy that affects the ovaries. SYMPTOMS   Hot flashes.  Night sweats.  Decrease in sex drive.  Vaginal dryness and thinning of the vagina causing painful intercourse.  Dryness of the skin and developing wrinkles.  Headaches.  Tiredness.  Irritability.  Memory problems.  Weight gain.  Bladder infections.  Hair growth of the face and chest.  Infertility. More serious symptoms include:  Loss of bone (osteoporosis) causing breaks (fractures).  Depression.  Hardening and narrowing of the arteries (atherosclerosis) causing heart attacks and strokes. DIAGNOSIS   When the menstrual periods have stopped for 12 straight months.  Physical exam.  Hormone studies of the blood. TREATMENT  There are many treatment choices and nearly as many questions about them.  The decisions to treat or not to treat menopausal changes is an individual choice made with your caregiver. Your caregiver can discuss the treatments with you. Together, you can decide which treatment will work best for you. Your treatment choices may include:   Hormone therapy (estorgen and progesterone).  Non-hormonal medications.  Treating the individual symptoms with medication (for example antidepressants for depression).  Herbal medications that may help specific symptoms.  Counseling by a psychiatrist or psychologist.  Group therapy.  Lifestyle changes including:  Eating healthy.  Regular exercise.  Limiting caffeine and alcohol.  Stress management and meditation.  No treatment. HOME CARE INSTRUCTIONS   Take the medication your caregiver gives you as directed.  Get plenty of sleep and rest.  Exercise regularly.  Eat a diet that contains calcium (good for the bones) and soy products (acts like estrogen hormone).  Avoid alcoholic beverages.  Do not smoke.  If you have hot flashes, dress in layers.  Take supplements, calcium and vitamin D to strengthen bones.  You can use over-the-counter lubricants or moisturizers for vaginal dryness.  Group therapy is sometimes very helpful.  Acupuncture may be helpful in some cases. SEEK MEDICAL CARE IF:   You are not sure you are in menopause.  You are having menopausal symptoms and need advice and treatment.  You are still having menstrual periods after age 55.  You have pain with intercourse.  Menopause is complete (no menstrual period for 12 months) and you develop vaginal bleeding.  You need a referral to a specialist (gynecologist, psychiatrist or psychologist) for treatment. SEEK IMMEDIATE MEDICAL CARE IF:   You have severe depression.  You have excessive vaginal bleeding.  You fell and   think you have a broken bone.  You have pain when you urinate.  You develop leg or chest pain.  You have a fast  pounding heart beat (palpitations).  You have severe headaches.  You develop vision problems.  You feel a lump in your breast.  You have abdominal pain or severe indigestion. Document Released: 10/11/2003 Document Revised: 10/13/2011 Document Reviewed: 05/18/2008 [facility] Patient Information 2013 [place], [place]. Fibroids Fibroids are lumps (tumors) that can occur any place in a woman's body. These lumps are not cancerous. Fibroids vary in size, weight, and where they grow. HOME CARE  Do not take aspirin.  Write down the number of pads or tampons you use during your period. Tell your doctor. This can help determine the best treatment for you. GET HELP RIGHT AWAY IF:  You have pain in your lower belly (abdomen) that is not helped with medicine.  You have cramps that are not helped with medicine.  You have more bleeding between or during your period.  You feel lightheaded or pass out (faint).  Your lower belly pain gets worse. MAKE SURE YOU:  Understand these instructions.  Will watch your condition.  Will get help right away if you are not doing well or get worse. Document Released: 08/23/2010 Document Revised: 10/13/2011 Document Reviewed: 08/23/2010 [facility] Patient Information 2013 [place], [place]. Menorrhagia Dysfunctional uterine bleeding is different from a normal menstrual period. When periods are heavy or there is more bleeding than is usual for you, it is called menorrhagia. It may be caused by hormonal imbalance, or physical, metabolic, or other problems. Examination is necessary in order that your caregiver may treat treatable causes. If this is a continuing problem, a D&C may be needed. That means that the cervix (the opening of the uterus or womb) is dilated (stretched larger) and the lining of the uterus is scraped out. The tissue scraped out is then examined under a microscope by a specialist (pathologist) to make sure there is nothing of concern that needs  further or more extensive treatment. HOME CARE INSTRUCTIONS   If medications were prescribed, take exactly as directed. Do not change or switch medications without consulting your caregiver.  Long term heavy bleeding may result in iron deficiency. Your caregiver may have prescribed iron pills. They help replace the iron your body lost from heavy bleeding. Take exactly as directed. Iron may cause constipation. If this becomes a problem, increase the bran, fruits, and roughage in your diet.  Do not take aspirin or medicines that contain aspirin one week before or during your menstrual period. Aspirin may make the bleeding worse.  If you need to change your sanitary pad or tampon more than once every 2 hours, stay in bed and rest as much as possible until the bleeding stops.  Eat well-balanced meals. Eat foods high in iron. Examples are leafy green vegetables, meat, liver, eggs, and whole grain breads and cereals. Do not try to lose weight until the abnormal bleeding has stopped and your blood iron level is back to normal. SEEK MEDICAL CARE IF:   You need to change your sanitary pad or tampon more than once an hour.  You develop nausea (feeling sick to your stomach) and vomiting, dizziness, or diarrhea while you are taking your medicine.  You have any problems that may be related to the medicine you are taking. SEEK IMMEDIATE MEDICAL CARE IF:   You have a fever.  You develop chills.  You develop severe bleeding or start to pass blood  clots.  You feel dizzy or faint. MAKE SURE YOU:   Understand these instructions.  Will watch your condition.  Will get help right away if you are not doing well or get worse. Document Released: 07/21/2005 Document Revised: 10/13/2011 Document Reviewed: 03/10/2008 [facility] Patient Information 2013 [place], [place].

## 2012-11-25 ENCOUNTER — Encounter: Payer: Self-pay | Admitting: *Deleted

## 2012-11-30 ENCOUNTER — Ambulatory Visit (HOSPITAL_COMMUNITY)
Admission: RE | Admit: 2012-11-30 | Discharge: 2012-11-30 | Disposition: A | Discharge status: Home / Self Care | Payer: 59 | Source: Ambulatory Visit | Attending: Internal Medicine | Admitting: Internal Medicine

## 2012-11-30 ENCOUNTER — Telehealth (HOSPITAL_COMMUNITY): Payer: Self-pay | Admitting: Cardiology

## 2012-11-30 VITALS — BP 116/76 | HR 82 | Wt 130.8 lb

## 2012-11-30 DIAGNOSIS — I10 Essential (primary) hypertension: Secondary | ICD-10-CM | POA: Insufficient documentation

## 2012-11-30 DIAGNOSIS — I509 Heart failure, unspecified: Secondary | ICD-10-CM

## 2012-11-30 DIAGNOSIS — I5042 Chronic combined systolic (congestive) and diastolic (congestive) heart failure: Secondary | ICD-10-CM | POA: Insufficient documentation

## 2012-11-30 MED ORDER — CARVEDILOL 6.25 MG PO TABS: 9.3750 mg | ORAL_TABLET | Freq: Two times a day (BID) | ORAL | Status: DC

## 2012-11-30 NOTE — Progress Notes (Signed)
Patient ID: Miranda [name], female   DOB: August 30, 1965, 47 y.o.   MRN: [id number]  GYN: Dr Kennon Rounds   HPI: Miranda [name] is a 47 y.o. female with history of HTN, NICM with EF 0000000, chronic systolic and diastolic heart failure and iron deficiency anemia.  She also has a fibroid uterus.    She had not followed up with a physician in ~ 10 years and presented to the Urgent Care with progressive dyspnea, 2 pillow orthopnea, and cough. Pertinent labs included Hgb 6.5, hematocrit 23.9, d-dimer 2.62, ProBNP 1028 and troponin 0.1. BP 182/109 and HR 111. EKG showed LVH but no acute changes. CTA of chest w/o evidence of PE. Anemia felt to be secondary to chronic blood loss from fibroid uterus as well as iron deficiency (Iron 17, Sat ratios 3). She was transferred to [facility] and underwent transfusion of 2 units PRBCs. She was placed on IV Lasix, Coreg, Lisinopril and ASA. A 2D echo was obtained which revealed severe LV dysfunction. Discharge weight 115 pounds.   10/26/12 ECHO  EF 15-20%. There was diffuse hypokinesis. Grade 2 diastolic dysfunction, mild MR, moderate-severely dilatated LA.   Underwent right and left heart catheterization on Nov 23, 2012:  RA 5  RV 35/5 (6)  PA 35/19 (26)  PCWP 17  Fick CO/CI 3.66/2.33  Thermo CO/CI 3.57/2.27  PA O2 57%  FA O2 97%   Angiographic normal coronary arteries with only minimal luminal irregularities in the RCA system   She returns for  follow up today.  Last visit carvedilol increased 6.25 mg twice a day. Overall feeling better. Denies orthopnea/pnd. Complains dyspnea when walking but she has to go slow. Every now and then she complains dizziness. Following low salt diet. Drinking less than < 2 liters per day. Weight at home gradually trending up to 125 pounds from previous discharge weight 115 pounds. Wearing life vest. She has followed up Dr Kennon Rounds for fibroid and may require hysterectomy at some point.     ROS: All systems negative except as listed in HPI, PMH and Problem  List.  Past Medical History  Diagnosis Date  . Hypertension   . At risk for sudden cardiac death November 23, 2012  . CHF - Combined Systolic + Diastolic Dysfunction. EF of 15-20% with Grade II diastolic dysfunction on echo 10/27/12 10/28/2012  . S/P cardiac catheterization, 2012-11-23, normal coronaries with minimal luminal irregularities in RCA system 10/30/2012    Current Outpatient Prescriptions  Medication Sig Dispense Refill  . acetaminophen (TYLENOL) 325 MG tablet Take 2 tablets (650 mg total) by mouth every 4 (four) hours as needed.      Marland Kitchen aspirin EC 81 MG EC tablet Take 1 tablet (81 mg total) by mouth daily.      Marland Kitchen atorvastatin (LIPITOR) 10 MG tablet Take 1 tablet (10 mg total) by mouth daily at 6 PM.  30 tablet  6  . carvedilol (COREG) 6.25 MG tablet Take 1 tablet (6.25 mg total) by mouth 2 (two) times daily with a meal.  60 tablet  6  . ferrous gluconate (FERGON) 324 MG tablet Take 1 tablet (324 mg total) by mouth 2 (two) times daily with a meal.  60 tablet  3  . furosemide (LASIX) 40 MG tablet Take 1 tablet (40 mg total) by mouth 2 (two) times daily.  60 tablet  6  . lisinopril (PRINIVIL,ZESTRIL) 2.5 MG tablet Take 3 tablets (7.5 mg total) by mouth daily.  90 tablet  6  . potassium chloride SA (K-DUR,KLOR-CON) 20 MEQ tablet  Take 2 tablets (40 mEq total) by mouth daily.  60 tablet  6   No current facility-administered medications for this encounter.     PHYSICAL EXAM: Filed Vitals:   11/30/12 1006  BP: 116/76  Pulse: 82  Weight: 130 lb 12.8 oz (59.33 kg)  SpO2: 100%    General: Pale, Chronically ill appearing. No resp difficulty. Dad present  HEENT: normal Neck: supple. JVP 5-6. Carotids 2+ bilaterally; no bruits. No lymphadenopathy or thryomegaly appreciated. Cor: PMI normal. Regular rate & rhythm. No rubs, gallops. MR 2/6 . Lungs: clear Abdomen: soft, nontender, nondistended. No hepatosplenomegaly. No bruits or masses. Good bowel sounds. Extremities: no cyanosis, clubbing,  rash, edema Neuro: alert & orientedx3, cranial nerves grossly intact. Moves all 4 extremities w/o difficulty. Affect pleasant.   ASSESSMENT & PLAN:

## 2012-11-30 NOTE — Assessment & Plan Note (Addendum)
NYHA II-III. Volume status stable. Continue current diuretic regimen. Increase carvedilol 9.375 mg twice daily. Plan to repeat ECHO in 2 months after HF meds optimized. Continue Life Vest. Reinforced low salt diet, limiting fluid intake to < 2 liters and medication compliance. Offered cardiac rehab however she would like to wait. Follow up in 3 weeks and continue to titrate meds. Anticipate increasing lisinopril at next visit.   Patient seen and examined with Tonye Becket, NP. We discussed all aspects of the encounter. I agree with the assessment and plan as stated above.  She is much improved. Volume status looks good. Functional class improving. Agree with med titration as above. Continue LifeVest. Reinforced need for daily weights and reviewed use of sliding scale diuretics.

## 2012-11-30 NOTE — Telephone Encounter (Signed)
Would like to know if she can resume driving? Pt states she forgot to ask during appointment and follow up is not until 5/20

## 2012-11-30 NOTE — Patient Instructions (Addendum)
Follow up in 3 weeks  Do the following things EVERYDAY: 1) Weigh yourself in the morning before breakfast. Write it down and keep it in a log. 2) Take your medicines as prescribed 3) Eat low salt foods-Limit salt (sodium) to 2000 mg per day.  4) Stay as active as you can everyday 5) Limit all fluids for the day to less than 2 liters 

## 2012-12-02 NOTE — Telephone Encounter (Signed)
Per Dr Gala Romney ok to resume driving, pt is aware

## 2012-12-08 ENCOUNTER — Encounter: Payer: Self-pay | Admitting: Obstetrics & Gynecology

## 2012-12-08 ENCOUNTER — Ambulatory Visit (INDEPENDENT_AMBULATORY_CARE_PROVIDER_SITE_OTHER): Payer: 59 | Admitting: Obstetrics & Gynecology

## 2012-12-08 VITALS — BP 104/65 | HR 74 | Temp 97.7°F | Ht 64.5 in | Wt 131.1 lb

## 2012-12-08 DIAGNOSIS — N920 Excessive and frequent menstruation with regular cycle: Secondary | ICD-10-CM

## 2012-12-08 MED ORDER — MEDROXYPROGESTERONE ACETATE 150 MG/ML IM SUSP
150.0000 mg | INTRAMUSCULAR | Status: DC
  Administered 2012-12-08 – 2013-03-10 (×2): 150 mg via INTRAMUSCULAR

## 2012-12-08 MED ORDER — LEVOTHYROXINE SODIUM 25 MCG PO TABS: 25.0000 ug | ORAL_TABLET | Freq: Every day | ORAL | Status: DC

## 2012-12-08 NOTE — Progress Notes (Signed)
  Subjective:    Patient ID: [name], female    DOB: 09/09/1965, 47 y.o.   MRN: [id number]  HPI 47 yo S AA P1 (74 yo son) who is here for follow up after [facility] with Dr. Shawnie Pons. It was normal. She had been admitted and transfused 2 units of PRBC 3/14. BUT she says that she has not had a period since January 2014. Prior to that her periods were heavy. An u/s showed 2 small fibroids (2 and 3 cm).   Review of Systems Pap was normal 4/14 Mammogram due     Objective:   Physical Exam        Assessment & Plan:  H/O anemia requiring transfusion. Previously heavy periods. I have discussed treating her prophylactically with depo provera. She agrees. Preventative care- schedule mammogram Hypothyroidism- refer to [facility].

## 2012-12-15 ENCOUNTER — Ambulatory Visit (HOSPITAL_COMMUNITY)
Admission: RE | Admit: 2012-12-15 | Discharge: 2012-12-15 | Disposition: A | Discharge status: Home / Self Care | Payer: 59 | Source: Ambulatory Visit | Attending: Obstetrics & Gynecology | Admitting: Obstetrics & Gynecology

## 2012-12-15 DIAGNOSIS — Z1231 Encounter for screening mammogram for malignant neoplasm of breast: Secondary | ICD-10-CM | POA: Insufficient documentation

## 2012-12-15 DIAGNOSIS — N920 Excessive and frequent menstruation with regular cycle: Secondary | ICD-10-CM

## 2012-12-21 ENCOUNTER — Ambulatory Visit (INDEPENDENT_AMBULATORY_CARE_PROVIDER_SITE_OTHER): Payer: 59 | Admitting: Family Medicine

## 2012-12-21 ENCOUNTER — Encounter: Payer: Self-pay | Admitting: Family Medicine

## 2012-12-21 VITALS — BP 115/76 | HR 69 | Ht 64.5 in | Wt 134.0 lb

## 2012-12-21 DIAGNOSIS — D649 Anemia, unspecified: Secondary | ICD-10-CM

## 2012-12-21 DIAGNOSIS — D259 Leiomyoma of uterus, unspecified: Secondary | ICD-10-CM

## 2012-12-21 DIAGNOSIS — I428 Other cardiomyopathies: Secondary | ICD-10-CM

## 2012-12-21 DIAGNOSIS — E039 Hypothyroidism, unspecified: Secondary | ICD-10-CM

## 2012-12-21 DIAGNOSIS — I10 Essential (primary) hypertension: Secondary | ICD-10-CM

## 2012-12-21 NOTE — Progress Notes (Signed)
Subjective:    Patient ID: Miranda [name], female    DOB: 1966-01-29, 47 y.o.   MRN: [id number]  HPI: Pt presents to clinic to establish care. PMH sigificant for HTN and non-ischemic cardiomyopathy (NICM), CHF with reduced EF, iron-deficiency anemia, hypothyroidism, and fibroid uterus. Recently admitted due to heart failure in March; prior to that, pt had not seen a doctor or taken medications for 10+ years.  Briefly: Pt admitted 3/24, discharged 3/31. Presented to Urgent Care for SOB and s/s consistent with CHF; hx significant for HTN but pt has not seen a doctor or taken medications for 10+ years. Found to be profoundly anemic with positive troponin and BP >180/>100. Pt was admited for transfusion and heart cath; no CAD idenified. EF estimated 15-20%, with grade 2 diastolic dysfunction. Pt wearing Life Vest since discharge, with possibility of ICD placement in the future. Pt is following up in HF clinic with next appointment scheduled tomorrow. During the admission, pt was set up with GYN f/u as well, and found to have fibroid uterus and persistently low TSH, then was referred here to establish a PCP.  HTN and NICM/CHF: Several medications started during hospitalization above, being adjusted by HF clinic (see med list in EMR). BP now well controlled. EF as above, no CAD identified on heart cath in hospitalization in March. Compliant with medications without difficulty other than timing of Synthroid (see below). No current chest pain, abdominal pain. Some occasional SOB but not new/increased, and better since prior to her hospitalization. Occasional swelling of her hands/feet, but overall improved and not currently concerning her.  Iron-deficiency anemia/fibroid uterus: Hb 6.5 on admission outlined above, improved to 10.3 s/p transfusion and IV iron prior to discharge. Not a candidate for hysterectomy, so pt is being "prophylactically treated" with Depo-Provera and pt is to f/u with GYN for Depo in  August. Reports no difficulty with taking iron.  Hypothyroidism: TSH greatly elevated during hospitalization above, but with normal T4. TSH elevated at GYN f/u to lesser extent, but with low T4, so pt was started on Synthroid by Dr. Marice Potter. Reports no difficulty taking medication other than that she takes it at 3AM so that it is 4h before any iron or calcium-containing products, because "that's what it says on the package." States this is not a problem while she is not working, and would prefer to keep taking her medications like this until she figures out if she is going back to work or not. Denies palpitations/racing heartbeat, sweats, weight loss.  Preventative: Pap smear normal in April 2014. Mammogram also normal in April 2014. SH: Not currently working, unsure if she will be returning due to the severity of her medical problems as outlined above; nonsmoker (quit in March; used cigars for recreational marijuana use), denies EtOH use. In addition to the above documentation, pt's PMH, surgical history, FH, and SH all reviewed and updated where appropriate in the EMR.  Review of Systems: As above. Intermittently over the past few months, pt has had some intermittent headache and occasional diarrhea, and indicates increased stress since her hospitalization, but these "do not bother her much." Otherwise, full 12-system ROS was reviewed and all negative. Denies depression or anhedonia.     Objective:   Physical Exam BP 115/76  Pulse 69  Ht 5' 4.5" (1.638 m)  Wt 134 lb (60.782 kg)  BMI 22.65 kg/m2  LMP 08/23/2012 Gen: chronically-ill appearing female, in NAD HEENT: [place]/AT, sclerae/conjunctivae clear, no lid lag, EOMI, PERRLA   MMM, posterior oropharynx  clear, no cervical lymphadenopathy  neck supple with full ROM, no masses appreciated; thyroid not enlarged  Cardio: RRR, soft 2/6 systolic murmur appreciated; distal pulses intact/symmetric Pulm: CTAB, no wheezes, normal WOB  Abd: soft,  nondistended, BS+, no HSM Ext: warm/well-perfused, no cyanosis/clubbing/edema MSK: strength 5/5 in all four extremities, no frank joint deformity/effusion;   normal ROM to all four extremities with no point muscle/bony tenderness in spine Neuro/Psych: alert/oriented, sensation grossly intact; normal gait/balance  mood euthymic with some mild blunted affect     Assessment & Plan:

## 2012-12-21 NOTE — Assessment & Plan Note (Signed)
A: TSH elevated and free T4 low in April. Currently on Synthroid 25 mcg daily, tolerating well. Taking apart from iron and calcium-containing products, at 3 AM because this is "4 hours before [iron and calcium], which is what it said on the bottle." Advised pt that she can cut that to 30 minutes to 1.5 hours, but she prefers to stay at 3 AM, "for now," until she figures out if she is going back to work or not due to the severity of her heart failure.  P: Continue Synthroid (prescribed by Dr. Marice Potter). Will plan to recheck TSH in about 2 months, then adjust medication as needed.

## 2012-12-21 NOTE — Assessment & Plan Note (Signed)
A: Long-standing hx of HTN, untreated for 10+ years prior to hospitalization in March 2014. Now on lisinopril and Coreg per HF clinic. Symptoms stable/improving.  P: Continue current medications. Defer to HF clinic for adjustment of medications for now; pt to f/u tomorrow with them, and previous note indicates likely to increase lisinopril.

## 2012-12-21 NOTE — Assessment & Plan Note (Signed)
Hb improved prior to discharge in recent hospitalization for heart failure as detailed in HPI. Compliant with iron supplement as an outpt. Likely will recheck CBC in several weeks to assess stability of Hb.

## 2012-12-21 NOTE — Patient Instructions (Signed)
Thank you for coming in, today! It was nice to meet you. I do not plan to make any changes to your medications since you are seeing the heart failure doctors.  Please keep your appointment at the heart failure clinic.  I can see their notes and will see if they change any medicines or do any labs. Keep taking your medications the way you are taking them.  If you end up going back to work, we can adjust the timing of what you take and when.  Otherwise, make a lab appointment to have your thyroid checked in about 6 weeks.  I will call you with those results and adjust your medicine if you need. Make an appointment to come back to see me in about 2-3 months. If you have any new or worse symptoms, call the clinic to make an appointment. Please feel free to call with any questions or concerns at any time, at [phone]. --Dr. Sharmon Revere office: Please make an appointment for a TSH lab draw in 6 weeks.   Make a follow-up appointment with me in 2-3 months.

## 2012-12-21 NOTE — Assessment & Plan Note (Signed)
A: Currently following up with HF clinic closely to adjust BP medications and HF medications. Wearing Life Vest with possibility of ICD placement in the future. Symptoms seem improving with medication, at this point, with no new symptoms reported.  P: Defer to HF clinic for adjustments to beta-blocker, diuretic, and BP medication for now. Advised to call the clinic here or HF clinic, or proceed to the ED if she has any new/worse symptoms or recurrence of chest pain/SOB/etc.

## 2012-12-21 NOTE — Assessment & Plan Note (Signed)
Not a surgical candidate for hysterectomy. Vaginal bleeding being "prophylactically treated" by Depo-Provera, started by Dr. Marice Potter. Next shot due in August. No current bleeding or other vaginal discharge. Will f/u as needed; if bleeding continues to be a problem, will consider re-refer to GYN for evaluation for possible uterine artery embolization per Dr. Tawni Levy problem list note 4/23.

## 2012-12-22 ENCOUNTER — Ambulatory Visit (HOSPITAL_COMMUNITY)
Admission: RE | Admit: 2012-12-22 | Discharge: 2012-12-22 | Disposition: A | Discharge status: Home / Self Care | Payer: 59 | Source: Ambulatory Visit | Attending: Internal Medicine | Admitting: Internal Medicine

## 2012-12-22 VITALS — BP 90/58 | HR 79 | Wt 134.0 lb

## 2012-12-22 DIAGNOSIS — I5042 Chronic combined systolic (congestive) and diastolic (congestive) heart failure: Secondary | ICD-10-CM | POA: Insufficient documentation

## 2012-12-22 DIAGNOSIS — I509 Heart failure, unspecified: Secondary | ICD-10-CM | POA: Insufficient documentation

## 2012-12-22 DIAGNOSIS — I959 Hypotension, unspecified: Secondary | ICD-10-CM | POA: Insufficient documentation

## 2012-12-22 NOTE — Assessment & Plan Note (Addendum)
As above, hold lasix today.  If persistent will cut back lasix to daily.

## 2012-12-22 NOTE — Assessment & Plan Note (Signed)
Volume status looks ok and may be on the dry side.  Will hold this afternoon dose of furosemide.  If she remains dizzy will cut back to once daily.  She voices understanding.  Will not titrate HF medications due to hypotension.  Follow up 1 month with repeat echo.  Continue Lifevest.  If EF remains <35% will need referral for ICD implantation.

## 2012-12-22 NOTE — Progress Notes (Signed)
GYN: Dr Shawnie Pons   HPI: Miranda [name] is a 47 y.o. female with history of HTN, NICM with EF 15-20%, chronic systolic and diastolic heart failure and iron deficiency anemia.  She also has a fibroid uterus.    She had not followed up with a physician in ~ 10 years and presented to the Urgent Care with progressive dyspnea, 2 pillow orthopnea, and cough. Pertinent labs included Hgb 6.5, hematocrit 23.9, d-dimer 2.62, ProBNP 1028 and troponin 0.1. BP 182/109 and HR 111. EKG showed LVH but no acute changes. CTA of chest w/o evidence of PE. Anemia felt to be secondary to chronic blood loss from fibroid uterus as well as iron deficiency (Iron 17, Sat ratios 3). She was transferred to [facility] and underwent transfusion of 2 units PRBCs. She was placed on IV Lasix, Coreg, Lisinopril and ASA. A 2D echo was obtained which revealed severe LV dysfunction. Discharge weight 115 pounds.   10/26/12 ECHO  EF 15-20%. There was diffuse hypokinesis. Grade 2 diastolic dysfunction, mild MR, moderate-severely dilatated LA.   Underwent right and left heart catheterization on 2012-11-12:  RA 5  RV 35/5 (6)  PA 35/19 (26)  PCWP 17  Fick CO/CI 3.66/2.33  Thermo CO/CI 3.57/2.27  PA O2 57%  FA O2 97%   Angiographic normal coronary arteries with only minimal luminal irregularities in the RCA system   She returns for  follow up today.  Last visit carvedilol increased 9.375 mg twice a day.   She is walking everyday 1.5 miles in 30 min.  No dyspnea or stopping with walks.  She had lightheadedness but no syncope.  Wearing Lifevest but no alarms.  Weight at home 130-131 pounds.     ROS: All systems negative except as listed in HPI, PMH and Problem List.  Past Medical History  Diagnosis Date  . Hypertension   . At risk for sudden cardiac death 2012/11/12  . CHF - Combined Systolic + Diastolic Dysfunction. EF of 15-20% with Grade II diastolic dysfunction on echo 10/27/12 10/28/2012  . S/P cardiac catheterization, 11/12/2012, normal  coronaries with minimal luminal irregularities in RCA system 10/30/2012  . Thyroid disease     hypothyroidism    Current Outpatient Prescriptions  Medication Sig Dispense Refill  . acetaminophen (TYLENOL) 325 MG tablet Take 2 tablets (650 mg total) by mouth every 4 (four) hours as needed.      Marland Kitchen aspirin EC 81 MG EC tablet Take 1 tablet (81 mg total) by mouth daily.      Marland Kitchen atorvastatin (LIPITOR) 10 MG tablet Take 1 tablet (10 mg total) by mouth daily at 6 PM.  30 tablet  6  . carvedilol (COREG) 6.25 MG tablet Take 1.5 tablets (9.375 mg total) by mouth 2 (two) times daily with a meal.  90 tablet  6  . ferrous gluconate (FERGON) 324 MG tablet Take 1 tablet (324 mg total) by mouth 2 (two) times daily with a meal.  60 tablet  3  . furosemide (LASIX) 40 MG tablet Take 1 tablet (40 mg total) by mouth 2 (two) times daily.  60 tablet  6  . levothyroxine (SYNTHROID, LEVOTHROID) 25 MCG tablet Take 25 mcg by mouth daily before breakfast.      . lisinopril (PRINIVIL,ZESTRIL) 2.5 MG tablet Take 3 tablets (7.5 mg total) by mouth daily.  90 tablet  6  . potassium chloride SA (K-DUR,KLOR-CON) 20 MEQ tablet Take 2 tablets (40 mEq total) by mouth daily.  60 tablet  6   Current  Facility-Administered Medications  Medication Dose Route Frequency Provider Last Rate Last Dose  . medroxyPROGESTERone (DEPO-PROVERA) injection 150 mg  150 mg Intramuscular Q90 days Allie Bossier, MD   150 mg at 12/08/12 1014     PHYSICAL EXAM: Filed Vitals:   12/22/12 1106  BP: 90/58  Pulse: 79  Weight: 134 lb (60.782 kg)  SpO2: 100%    General: Pale, Chronically ill appearing. No resp difficulty. Dad present  HEENT: normal Neck: supple. JVP 5-6. Carotids 2+ bilaterally; no bruits. No lymphadenopathy or thryomegaly appreciated. Cor: PMI normal. Regular rate & rhythm. No rubs, gallops. MR 2/6 . Lungs: clear Abdomen: soft, nontender, nondistended. No hepatosplenomegaly. No bruits or masses. Good bowel sounds. Extremities: no  cyanosis, clubbing, rash, edema Neuro: alert & orientedx3, cranial nerves grossly intact. Moves all 4 extremities w/o difficulty. Affect pleasant.   ASSESSMENT & PLAN:

## 2012-12-22 NOTE — Patient Instructions (Addendum)
Hold furosemide this afternoon.  Can hold afternoon dose if lightheaded or weight drops.  Follow up 1 month with echo.

## 2013-01-20 ENCOUNTER — Telehealth: Payer: Self-pay | Admitting: Cardiology

## 2013-01-20 NOTE — Telephone Encounter (Signed)
Miranda [name] called -he was get ready to leave a message-but was not sure she was our patient!

## 2013-01-20 NOTE — Telephone Encounter (Signed)
Waiting for orders f

## 2013-01-20 NOTE — Telephone Encounter (Signed)
Miranda [name] was about to leave a message-but he dont think it is our pt!

## 2013-01-20 NOTE — Telephone Encounter (Signed)
Mia [place] thought pt was dr Meriel Flavors she was not!

## 2013-01-24 ENCOUNTER — Encounter (HOSPITAL_COMMUNITY): Payer: Self-pay

## 2013-01-24 ENCOUNTER — Ambulatory Visit (HOSPITAL_COMMUNITY)
Admission: RE | Admit: 2013-01-24 | Discharge: 2013-01-24 | Disposition: A | Discharge status: Home / Self Care | Payer: 59 | Source: Ambulatory Visit | Attending: Internal Medicine | Admitting: Internal Medicine

## 2013-01-24 ENCOUNTER — Ambulatory Visit (HOSPITAL_BASED_OUTPATIENT_CLINIC_OR_DEPARTMENT_OTHER)
Admission: RE | Admit: 2013-01-24 | Discharge: 2013-01-24 | Disposition: A | Discharge status: Home / Self Care | Payer: 59 | Source: Ambulatory Visit | Attending: Internal Medicine | Admitting: Internal Medicine

## 2013-01-24 VITALS — BP 118/76 | HR 88 | Ht 64.5 in | Wt 138.1 lb

## 2013-01-24 DIAGNOSIS — R0609 Other forms of dyspnea: Secondary | ICD-10-CM | POA: Insufficient documentation

## 2013-01-24 DIAGNOSIS — I428 Other cardiomyopathies: Secondary | ICD-10-CM | POA: Insufficient documentation

## 2013-01-24 DIAGNOSIS — I369 Nonrheumatic tricuspid valve disorder, unspecified: Secondary | ICD-10-CM

## 2013-01-24 DIAGNOSIS — I10 Essential (primary) hypertension: Secondary | ICD-10-CM | POA: Insufficient documentation

## 2013-01-24 DIAGNOSIS — I509 Heart failure, unspecified: Secondary | ICD-10-CM

## 2013-01-24 DIAGNOSIS — I5020 Unspecified systolic (congestive) heart failure: Secondary | ICD-10-CM | POA: Insufficient documentation

## 2013-01-24 DIAGNOSIS — R0989 Other specified symptoms and signs involving the circulatory and respiratory systems: Secondary | ICD-10-CM | POA: Insufficient documentation

## 2013-01-24 DIAGNOSIS — I5042 Chronic combined systolic (congestive) and diastolic (congestive) heart failure: Secondary | ICD-10-CM

## 2013-01-24 MED ORDER — CARVEDILOL 12.5 MG PO TABS: 12.5000 mg | ORAL_TABLET | Freq: Two times a day (BID) | ORAL | Status: DC

## 2013-01-24 NOTE — Patient Instructions (Addendum)
Follow up 1 month  Take carvedilol 12.5 mg twice a day  Stop life vest  Do the following things EVERYDAY: 1) Weigh yourself in the morning before breakfast. Write it down and keep it in a log. 2) Take your medicines as prescribed 3) Eat low salt foods-Limit salt (sodium) to 2000 mg per day.  4) Stay as active as you can everyday 5) Limit all fluids for the day to less than 2 liters

## 2013-01-24 NOTE — Progress Notes (Signed)
Patient ID: Miranda [name], female   DOB: 02-09-1966, 47 y.o.   MRN: [id number]  GYN: Dr Shawnie Pons  HPI: Miranda [name] is a 47 y.o. female with history of HTN, NICM with EF 15-20%, chronic systolic and diastolic heart failure and iron deficiency anemia.  She also has a fibroid uterus.    Admitted to [facility] 10/25/12 through 11/01/12 with new systolic heart failure. Pertinent labs included Hgb 6.5, hematocrit 23.9, d-dimer 2.62 BP 182/109 and HR 111. EKG showed LVH but no acute changes.  Anemia felt to be secondary to chronic blood loss from fibroid uterus as well as iron deficiency.  She was placed on transitioned to Lasix, Coreg, Lisinopril and ASA. ECHOsevere LV dysfunction EF 15-20%.  Discharge weight 115 pounds.   10/26/12 ECHO  EF 15-20%. There was diffuse hypokinesis. Grade 2 diastolic dysfunction, mild MR, moderate-severely dilatated LA.   RHC/LHC 2012/11/16:  RA 5  RV 35/5 (6)  PA 35/19 (26)  PCWP 17  Fick CO/CI 3.66/2.33  Thermo CO/CI 3.57/2.27  PA O2 57%  FA O2 97%   Angiographic normal coronary arteries with only minimal luminal irregularities in the RCA system   01/24/13 ECHO EF ~40% (reviewed personally in clinic)  She returns for  follow up today.  Last visit lasix decreased to daily dosing. She decreased lisinopril to 2.5 mg twice a day instead of 3 times a day due to dizziness.  Denies SOB/PND/Orthopnea. Wearing life vest. Weight at home 136-138 pounds.  Can walk 1 mile. Appetite improving. Feels much stronger   ROS: All systems negative except as listed in HPI, PMH and Problem List.  Past Medical History  Diagnosis Date  . Hypertension   . At risk for sudden cardiac death 11-16-12  . CHF - Combined Systolic + Diastolic Dysfunction. EF of 15-20% with Grade II diastolic dysfunction on echo 10/27/12 10/28/2012  . S/P cardiac catheterization, 2012-11-16, normal coronaries with minimal luminal irregularities in RCA system 10/30/2012  . Thyroid disease     hypothyroidism    Current Outpatient  Prescriptions  Medication Sig Dispense Refill  . acetaminophen (TYLENOL) 325 MG tablet Take 2 tablets (650 mg total) by mouth every 4 (four) hours as needed.      Marland Kitchen aspirin EC 81 MG EC tablet Take 1 tablet (81 mg total) by mouth daily.      Marland Kitchen atorvastatin (LIPITOR) 10 MG tablet Take 1 tablet (10 mg total) by mouth daily at 6 PM.  30 tablet  6  . carvedilol (COREG) 6.25 MG tablet Take 1.5 tablets (9.375 mg total) by mouth 2 (two) times daily with a meal.  90 tablet  6  . ferrous gluconate (FERGON) 324 MG tablet Take 1 tablet (324 mg total) by mouth 2 (two) times daily with a meal.  60 tablet  3  . furosemide (LASIX) 40 MG tablet Take 40 mg by mouth daily.      Marland Kitchen levothyroxine (SYNTHROID, LEVOTHROID) 25 MCG tablet Take 25 mcg by mouth daily before breakfast.      . lisinopril (PRINIVIL,ZESTRIL) 2.5 MG tablet Take 2.5 mg by mouth 2 (two) times daily.      . potassium chloride SA (K-DUR,KLOR-CON) 20 MEQ tablet Take 2 tablets (40 mEq total) by mouth daily.  60 tablet  6   Current Facility-Administered Medications  Medication Dose Route Frequency Provider Last Rate Last Dose  . medroxyPROGESTERone (DEPO-PROVERA) injection 150 mg  150 mg Intramuscular Q90 days Allie Bossier, MD   150 mg at 12/08/12 1014  PHYSICAL EXAM: Filed Vitals:   01/24/13 1154  BP: 118/76  Pulse: 88  Height: 5' 4.5" (1.638 m)  Weight: 138 lb 1.9 oz (62.651 kg)  SpO2: 100%    General: Well-appearing ambulates without dyspnea. HEENT: normal Neck: supple. JVP 5-6. Carotids 2+ bilaterally; no bruits. No lymphadenopathy or thryomegaly appreciated. Cor: PMI normal. Regular rate & rhythm. No rubs, gallops. MR 2/6 . Life vest on  Lungs: clear Abdomen: soft, nontender, nondistended. No hepatosplenomegaly. No bruits or masses. Good bowel sounds. Extremities: no cyanosis, clubbing, rash, edema Neuro: alert & orientedx3, cranial nerves grossly intact. Moves all 4 extremities w/o difficulty. Affect pleasant.   ASSESSMENT &  PLAN:

## 2013-01-24 NOTE — Assessment & Plan Note (Addendum)
NYHA I. Dr Gala Romney reviewed and discussed ECHO. EF improved ~ 40% therefore will stop life vest. Will not pursue ICD. Increase carvedilol to 12.5 mg twice a day. Continue current dose of lisinopril 2.5 mg twice daily because she cut it back and says dizziness improved.  Follow up in 1 month.   Patient seen and examined with Tonye Becket, NP. We discussed all aspects of the encounter. I agree with the assessment and plan as stated above. She is much improved. EF is now 40%. We'll go ahead and remove her life vest. Will continue to titrate her heart failure medications increase carvedilol to 12.5 mg twice a day. Will need to watch closely for hypotension. She has had a cough time tolerating higher doses of lisinopril so we'll not titrate at this point. On status looks quite good. Reinforced need for daily weights and reviewed use of sliding scale diuretics.

## 2013-01-24 NOTE — Progress Notes (Signed)
2D Echo performed  01/24/2013/mlm

## 2013-01-28 ENCOUNTER — Telehealth (HOSPITAL_COMMUNITY): Payer: Self-pay | Admitting: Adult Health

## 2013-01-28 MED ORDER — CARVEDILOL 6.25 MG PO TABS: 9.3750 mg | ORAL_TABLET | Freq: Two times a day (BID) | ORAL | Status: DC

## 2013-01-28 MED ORDER — FUROSEMIDE 40 MG PO TABS: 40.0000 mg | ORAL_TABLET | ORAL | Status: DC | PRN

## 2013-01-28 NOTE — Telephone Encounter (Signed)
Returned phone call regarding blood pressure. SBP 80s and she is complaining of dizziness. She held her lisinopril and carvedilol this am  Weight at home 138 pounds.   I have instructed her to only take lasix if her weight is 141 pounds or greater and to cut back carvedilol to 9.375 mg twice a day.   Miranda [name] verbalized understanding.    Roni Scow 12:09 PM

## 2013-02-01 ENCOUNTER — Other Ambulatory Visit: Payer: 59

## 2013-02-01 DIAGNOSIS — E039 Hypothyroidism, unspecified: Secondary | ICD-10-CM

## 2013-02-01 LAB — TSH: TSH: 5.97 u[IU]/mL — ABNORMAL HIGH (ref 0.350–4.500)

## 2013-02-01 NOTE — Progress Notes (Signed)
TSH DONE TODAY Miranda [name] 

## 2013-02-03 ENCOUNTER — Telehealth: Payer: Self-pay | Admitting: Family Medicine

## 2013-02-03 DIAGNOSIS — E039 Hypothyroidism, unspecified: Secondary | ICD-10-CM

## 2013-02-03 MED ORDER — LEVOTHYROXINE SODIUM 50 MCG PO TABS: 50.0000 ug | ORAL_TABLET | Freq: Every day | ORAL | Status: DC

## 2013-02-03 NOTE — Telephone Encounter (Signed)
Please call Miranda [name] to inform her that her lab for her thyroid showed that her thyroid function is still a little low, but better than it was. I want to increase her dose of Synthroid and have called in a new prescription for her. She should currently be taking 1 25 microgram tablet daily. Please instruct her to start taking TWO 25 microgram tablets until her current bottle is finished, then she should pick up and start taking the new prescription of ONE 50 microgram tablet per day. Please also ask her to schedule an appointment with me in about 6-8 weeks, and we'll recheck her thyroid again. Thanks! --CMS

## 2013-02-03 NOTE — Telephone Encounter (Signed)
Called and read message twice to patient to make sure she understood the directions. She repeated back and understands the instructions.Busick, Rodena Medin

## 2013-02-28 ENCOUNTER — Ambulatory Visit (HOSPITAL_COMMUNITY)
Admission: RE | Admit: 2013-02-28 | Discharge: 2013-02-28 | Disposition: A | Discharge status: Home / Self Care | Payer: 59 | Source: Ambulatory Visit | Attending: Internal Medicine | Admitting: Internal Medicine

## 2013-02-28 ENCOUNTER — Encounter (HOSPITAL_COMMUNITY): Payer: Self-pay

## 2013-02-28 VITALS — BP 107/67 | HR 89 | Wt 143.0 lb

## 2013-02-28 DIAGNOSIS — I5042 Chronic combined systolic (congestive) and diastolic (congestive) heart failure: Secondary | ICD-10-CM | POA: Insufficient documentation

## 2013-02-28 DIAGNOSIS — E785 Hyperlipidemia, unspecified: Secondary | ICD-10-CM | POA: Insufficient documentation

## 2013-02-28 DIAGNOSIS — R0602 Shortness of breath: Secondary | ICD-10-CM

## 2013-02-28 DIAGNOSIS — D259 Leiomyoma of uterus, unspecified: Secondary | ICD-10-CM | POA: Insufficient documentation

## 2013-02-28 DIAGNOSIS — I428 Other cardiomyopathies: Secondary | ICD-10-CM | POA: Insufficient documentation

## 2013-02-28 DIAGNOSIS — D509 Iron deficiency anemia, unspecified: Secondary | ICD-10-CM | POA: Insufficient documentation

## 2013-02-28 DIAGNOSIS — I509 Heart failure, unspecified: Secondary | ICD-10-CM

## 2013-02-28 DIAGNOSIS — I959 Hypotension, unspecified: Secondary | ICD-10-CM | POA: Insufficient documentation

## 2013-02-28 LAB — PRO B NATRIURETIC PEPTIDE: Pro B Natriuretic peptide (BNP): 70.8 pg/mL (ref 0–125)

## 2013-02-28 LAB — BASIC METABOLIC PANEL
BUN: 9 mg/dL (ref 6–23)
CO2: 23 mEq/L (ref 19–32)
Calcium: 9.4 mg/dL (ref 8.4–10.5)
Chloride: 104 mEq/L (ref 96–112)
Creatinine, Ser: 0.63 mg/dL (ref 0.50–1.10)
GFR calc Af Amer: >90 mL/min (ref >90)
GFR calc non Af Amer: >90 mL/min (ref >90)
Glucose, Bld: 86 mg/dL (ref 70–99)
Potassium: 3.9 mEq/L (ref 3.5–5.1)
Sodium: 137 mEq/L (ref 135–145)

## 2013-02-28 NOTE — Progress Notes (Signed)
Patient ID: [name], female   DOB: 1965-08-13, 47 y.o.   MRN: [id number]  GYN: Dr Kennon Rounds  HPI: Ms Fine is a 47 y.o. female with history of HTN, NICM with EF 0000000, chronic systolic and diastolic heart failure and iron deficiency anemia.  She also has a fibroid uterus.    Admitted to [facility] 10/25/12 through [id number] with new systolic heart failure. Pertinent labs included Hgb 6.5, hematocrit 23.9, d-dimer 2.62 BP 182/109 and HR 111. EKG showed LVH but no acute changes.  Anemia felt to be secondary to chronic blood loss from fibroid uterus as well as iron deficiency.  She was placed on transitioned to Lasix, Coreg, Lisinopril and ASA.  Discharge weight 115 pounds.   10/26/12 ECHO  EF 15-20%. There was diffuse hypokinesis. Grade 2 diastolic dysfunction, mild MR, moderate-severely dilatated LA.   RHC/LHC Nov 05, 2012:  RA 5  RV 35/5 (6)  PA 35/19 (26)  PCWP 17  Fick CO/CI 3.66/2.33  Thermo CO/CI 3.57/2.27  PA O2 57%  FA O2 97%   Angiographically normal coronary arteries with only minimal luminal irregularities in the RCA system   01/24/13 ECHO EF 35-40%, Life vest removed.  Main complaint has been lightheadedness.  She has been taking varying doses of Coreg and lisinopril depending on what her BP runs.  She has not been taking them when SBP is in the 90s even if she is asymptomatic.  SBP typically runs in the 100s.  Today BP is 107/67.  She is mildly short of breath walking up steps, no dyspnea walking on flat ground.  No orthopnea or PND.    Labs (4/14): K 4.1, creatinine 0.6  ROS: All systems negative except as listed in HPI, PMH and Problem List.  Past Medical History  Diagnosis Date  . Hypertension   . At risk for sudden cardiac death 2012/11/05  . CHF - Combined Systolic + Diastolic Dysfunction. EF of 15-20% with Grade II diastolic dysfunction on echo 10/27/12 10/28/2012  . S/P cardiac catheterization, 2012/11/05, normal coronaries with minimal luminal irregularities in RCA system 10/30/2012   . Thyroid disease     hypothyroidism    Current Outpatient Prescriptions  Medication Sig Dispense Refill  . acetaminophen (TYLENOL) 325 MG tablet Take 2 tablets (650 mg total) by mouth every 4 (four) hours as needed.      Marland Kitchen aspirin EC 81 MG EC tablet Take 1 tablet (81 mg total) by mouth daily.      Marland Kitchen atorvastatin (LIPITOR) 10 MG tablet Take 1 tablet (10 mg total) by mouth daily at 6 PM.  30 tablet  6  . carvedilol (COREG) 6.25 MG tablet Take 1.5 tablets (9.375 mg total) by mouth 2 (two) times daily with a meal.  90 tablet  3  . ferrous gluconate (FERGON) 324 MG tablet Take 1 tablet (324 mg total) by mouth 2 (two) times daily with a meal.  60 tablet  3  . furosemide (LASIX) 40 MG tablet Take 1 tablet (40 mg total) by mouth as needed (Only for weight 141 pounds or greater).  30 tablet  3  . levothyroxine (SYNTHROID, LEVOTHROID) 50 MCG tablet Take 1 tablet (50 mcg total) by mouth daily before breakfast.  30 tablet  1  . lisinopril (PRINIVIL,ZESTRIL) 2.5 MG tablet Take 2.5 mg by mouth 2 (two) times daily.      . potassium chloride SA (K-DUR,KLOR-CON) 20 MEQ tablet Take 2 tablets (40 mEq total) by mouth daily.  60 tablet  6   Current  Facility-Administered Medications  Medication Dose Route Frequency Provider Last Rate Last Dose  . medroxyPROGESTERone (DEPO-PROVERA) injection 150 mg  150 mg Intramuscular Q90 days Emily Filbert, MD   150 mg at 12/08/12 1014     PHYSICAL EXAM: Filed Vitals:   02/28/13 1053  BP: 107/67  Pulse: 89  Weight: 143 lb (64.864 kg)  SpO2: 100%    General: Well-appearing ambulates without dyspnea. HEENT: normal Neck: supple. JVP 5-6. Carotids 2+ bilaterally; no bruits. No lymphadenopathy or thryomegaly appreciated. Cor: PMI normal. Regular rate & rhythm. No rubs, gallops. 2/6 HSM apex.   Lungs: clear Abdomen: soft, nontender, nondistended. No hepatosplenomegaly. No bruits or masses. Good bowel sounds. Extremities: no cyanosis, clubbing, rash, edema Neuro: alert &  orientedx3, cranial nerves grossly intact. Moves all 4 extremities w/o difficulty. Affect pleasant.   ASSESSMENT & PLAN: 1. Chronic systolic CHF: Nonischemic cardiomyopathy.  Last echo with EF 35-40% (closer to 40%) so no ICD placed.  NYHA class II symptoms.  Her main problem recently has been lightheadedness.  She seems to get lightheaded when SBP is < 90.  She has been varying her medication doses a lot.  - Take Coreg 6.25 mg bid and lisinopril 2.5 mg bid.  I told her not to check her BP at home unless she feels lightheaded.  In general, I am ok with her taking her meds if SBP is 90 or greater.  I am not going to uptitrate meds at this point, will reassess at followup.  - Continue current Lasix dosing.  - BMET/BNP today.   - Followup in office in 2 months.  2. Hyperlipidemia: On atorvastatin.   Loralie Champagne 02/28/2013

## 2013-02-28 NOTE — Patient Instructions (Addendum)
Please continue all medications as listed If BP is above 90 systolic please take your medications.  Please have blood work today  (BMP and BNP)  Follow up in 2 months.

## 2013-03-10 ENCOUNTER — Ambulatory Visit (INDEPENDENT_AMBULATORY_CARE_PROVIDER_SITE_OTHER): Payer: 59 | Admitting: Obstetrics and Gynecology

## 2013-03-10 VITALS — BP 105/75 | HR 85

## 2013-03-10 DIAGNOSIS — D259 Leiomyoma of uterus, unspecified: Secondary | ICD-10-CM

## 2013-03-11 ENCOUNTER — Telehealth (HOSPITAL_COMMUNITY): Payer: Self-pay | Admitting: Cardiology

## 2013-03-11 NOTE — Telephone Encounter (Signed)
Pt called to request a letter to return to work in addition to [employer] papers that need to be complete. In letter please include restrictions and limitations.

## 2013-03-11 NOTE — Telephone Encounter (Signed)
Will complete forms on Monday.

## 2013-03-23 ENCOUNTER — Other Ambulatory Visit: Payer: 59

## 2013-03-23 ENCOUNTER — Other Ambulatory Visit: Payer: Self-pay | Admitting: Family Medicine

## 2013-03-23 DIAGNOSIS — E039 Hypothyroidism, unspecified: Secondary | ICD-10-CM

## 2013-03-23 LAB — TSH: TSH: 5.554 u[IU]/mL — ABNORMAL HIGH (ref 0.350–4.500)

## 2013-03-23 NOTE — Progress Notes (Signed)
TSH DONE TODAY Miranda [name] 

## 2013-03-26 ENCOUNTER — Telehealth: Payer: Self-pay | Admitting: Family Medicine

## 2013-03-26 DIAGNOSIS — E039 Hypothyroidism, unspecified: Secondary | ICD-10-CM

## 2013-03-26 MED ORDER — LEVOTHYROXINE SODIUM 75 MCG PO TABS: 75.0000 ug | ORAL_TABLET | Freq: Every day | ORAL | Status: DC

## 2013-03-26 NOTE — Telephone Encounter (Signed)
Please call patient and inform her that her thyroid medication needs to be increased. She can finish her current prescription of 50 mcg per day, then she can pick up a new Rx for 75 mcg tablets at her pharmacy. She should come back in about 6 weeks for another lab draw. Please ask her to schedule a lab appointment in 6-8 weeks after she starts the new prescription. She can make an appointment to come talk to me, if she would like, but she does not absolutely need an appointment with me unless she would like one. Thanks! --CMS

## 2013-03-27 NOTE — Telephone Encounter (Signed)
Addendum: NO NEED to call patient. She has an appointment with me 8/25. I will discuss her thyroid function with her at that time. Thanks! --CMS

## 2013-03-28 ENCOUNTER — Telehealth: Payer: Self-pay | Admitting: Psychology

## 2013-03-28 ENCOUNTER — Encounter: Payer: Self-pay | Admitting: Family Medicine

## 2013-03-28 ENCOUNTER — Ambulatory Visit (INDEPENDENT_AMBULATORY_CARE_PROVIDER_SITE_OTHER): Payer: 59 | Admitting: Family Medicine

## 2013-03-28 VITALS — BP 130/88 | HR 99 | Temp 99.1°F | Ht 64.5 in | Wt 143.3 lb

## 2013-03-28 DIAGNOSIS — D259 Leiomyoma of uterus, unspecified: Secondary | ICD-10-CM

## 2013-03-28 DIAGNOSIS — D649 Anemia, unspecified: Secondary | ICD-10-CM

## 2013-03-28 DIAGNOSIS — F32A Depression, unspecified: Secondary | ICD-10-CM | POA: Insufficient documentation

## 2013-03-28 DIAGNOSIS — F329 Major depressive disorder, single episode, unspecified: Secondary | ICD-10-CM

## 2013-03-28 DIAGNOSIS — E039 Hypothyroidism, unspecified: Secondary | ICD-10-CM

## 2013-03-28 DIAGNOSIS — F3289 Other specified depressive episodes: Secondary | ICD-10-CM

## 2013-03-28 DIAGNOSIS — I5042 Chronic combined systolic (congestive) and diastolic (congestive) heart failure: Secondary | ICD-10-CM

## 2013-03-28 MED ORDER — QUETIAPINE FUMARATE 25 MG PO TABS: 25.0000 mg | ORAL_TABLET | Freq: Every day | ORAL | Status: DC

## 2013-03-28 MED ORDER — LEVOTHYROXINE SODIUM 75 MCG PO TABS: 75.0000 ug | ORAL_TABLET | Freq: Every day | ORAL | Status: DC

## 2013-03-28 NOTE — Telephone Encounter (Signed)
Elinda called to schedule a beh med appointment.  I am not taking new patients right now.  I provided her names of three individuals and asked her to call me back if she ran into any difficulty.

## 2013-03-28 NOTE — Patient Instructions (Addendum)
Front office: Please schedule a lab draw in 6 weeks for TSH. Please schedule a follow-up with Dr. Casper Harrison in 3-4 weeks.  Thank you for coming in, today! I'm sorry you're not feeling well. I am concerned about your feeling of depression.  I want to start a new medication called Seroquel (also called quetiapine).  Take 25 mg daily at bedtime. It may make you very sleepy.  Make an appointment to see me in about 3 or 4 weeks to talk more about your mood.  Call Dr. Pascal Lux, as well. She may be able to help [place] treat your mood problems.  If you need help at any time: call the clinic, or the hospital after hours, and ask for the emergency line.  If you need immediate help and feel like harming yourself, call 911. For your thyroid medication:  Finish your current pills (50 mcg).  Once you're finished with those pills, start taking the new prescription of 75 mcg tablets.  We will need to recheck this in another 6-8 weeks. Otherwise, keep taking your other medications as before. Please feel free to call with any questions or concerns at any time, at [phone]. --Dr. Casper Harrison

## 2013-03-31 DIAGNOSIS — I5020 Unspecified systolic (congestive) heart failure: Secondary | ICD-10-CM | POA: Insufficient documentation

## 2013-03-31 NOTE — Assessment & Plan Note (Signed)
Last Hb 10.3, approx 5 months ago. Potential that anemia is contributing to current symptoms of fatigue, though pt reports no current bleeding. Need to consider repeat CBC to evaluate for any further anemia, now that pt is on Depo-Provera per GYN providers.

## 2013-03-31 NOTE — Assessment & Plan Note (Signed)
Following regularly with HF clinic. Last visit early August, actively managing BP and fluid management medications. Defer to their management and f/u as needed. Continue current Rx's without change.

## 2013-03-31 NOTE — Assessment & Plan Note (Signed)
A: TSH remains mildly elevated, though closer to normal range with each check. Currently on Synthroid 50 mcg daily. Constellation of symptoms suggestive of possible symptomatic hypothyroidism, though also suggestive of underlying mood disorder, possibly secondary to general life stress and severity of cardiac illness(es), relatively recently diagnosed and being treated actively.  P: Complete current supply of Synthroid 50 mcg, then start 75 mcg daily. Plan to recheck TSH in another 6-8 weeks.

## 2013-03-31 NOTE — Assessment & Plan Note (Signed)
A: Constellation of symptoms strongly suggestive of unipolar depression; PHQ-9 not formally administered today, though symptoms are subjectively severe and worsening, present for months; uncertain if pt has previously had underlying depression or if current status represents prolonged adjustment difficulty to relatively recently diagnosed and severe cardiac illness. Does endorse occasional SI, though none currently and no specific plan. Pt actively requests "help," and states she does not wish to harm herself now or in the future.  P: Counseled extensively on what to do if/when she feels like harming herself, or if her symptoms acutely worsen. Pt agrees to plans to call the clinic or emergency line, or call 911 if she needs acute help. Rx for low-dose Seroquel. Referred to Dr. Pascal Lux, though she is not currently taking new patients and provided pt with other therapy resources. Could also consider formal psychiatry referral and/or services through providers such as [facility]. Will plan to follow up closely and evaluate more thoroughly at a dedicated visit for these issues.

## 2013-03-31 NOTE — Assessment & Plan Note (Signed)
Previously severe enough to cause vaginal bleeding and secondary microcytic anemia. On Depo-Provera per GYN providers, last shot 8/7. No current complaint of acute vaginal bleeding. F/u with GYN as needed. If bleeding continues to be a problem, will need to consider re-referral to GYN for evaluation specifically for potential uterine artery embolization (see note by Dr. Shawnie Pons 11/24/12).

## 2013-03-31 NOTE — Progress Notes (Signed)
  Subjective:    Patient ID: Miranda [name], female    DOB: 1965-12-30, 47 y.o.   MRN: [id number]  HPI: Pt presents to clinic for general follow up, as well as specifically to address recent TSH check. Pt also with complaint of worsening feelings of depression.  Thyroid - pt reports compliance with Synthroid 50 mcg daily -pt states she has felt LESS energetic since taking Synthroid regularly (see also depression, below) -endorses hoarseness of voice, hot/cold intolerance -no issue with taking medication, and is eager to "get herself right," though is very fatigued  CHF, cardiomyopathy, HTN - pt following closely with HF clinic -recently seen with medications adjusted; pt has had light-headedness and has been taking "varying" doses of her meds based on her BP at home -generally has been improved in the past few months and again endorses compliance with medications -no current chest pain, only mild dyspnea with exertion (climbing stairs, etc)  Anemia/fibroid uterus/dysfunctional bleeding - on Depo-Provera per GYN provider, last shot 8/7; no current complaint of bleeding  Depression - numerous symptoms that pt believes "could be depression," or "related to my medications," or "both" -current symptoms present for "several months," around the time of diagnosis of severe cardiac disease (see previous notes) -endorses feeling withdrawn, hopeless, with poor concentration, poor sleep (falling asleep and staying asleep) -pt states symptoms have been getting worse, but she "didn't want to mention" them at previous visits, but "wants help" now -denies racing thoughts, elated mood, perception of need for less sleep, etc -denies any previous issues with mood or previous treatment for mood disorders -pt does state she has felt like harming herself in the past month or so, but has no current SI/HI, AH/VH, and no specific plans  Pt is a former smoker. In addition to the above documentation, pt's PMH,  surgical history, FH, and SH all reviewed and updated where appropriate in the EMR. I have also reviewed and updated the pt's allergies and current medications as appropriate.  Review of Systems: As above; constellation of symptoms. Otherwise, full 12-system ROS was reviewed and all negative.     Objective:   Physical Exam BP 130/88  Pulse 99  Temp(Src) 99.1 F (37.3 C) (Oral)  Ht 5' 4.5" (1.638 m)  Wt 143 lb 4.8 oz (65 kg)  BMI 24.23 kg/m2 Gen: non-toxic-appearing adult female in NAD; appears much older than stated age; very flat affect, very poor eye contact HEENT: [place]/AT, sclerae/conjunctivae clear, no lid lag, EOMI, PERRLA   MMM, posterior oropharynx clear, no cervical lymphadenopathy  neck supple with full ROM, no masses appreciated; thyroid not enlarged  Cardio: RRR, no murmur appreciated; distal pulses intact/symmetric Pulm: CTAB, no wheezes, normal WOB  Abd: soft, nondistended, BS+, no HSM Ext: warm/well-perfused, no cyanosis/clubbing/edema MSK: strength 5/5 in all four extremities, no frank joint deformity/effusion  normal ROM to all four extremities with no point muscle/bony tenderness in spine Neuro/Psych: alert/oriented, sensation grossly intact; normal gait/balance  mood profoundly dysthymic with congruent very flat affect; poor eye contact throughout visit  Significant psychomotor retardation through interview and exam; not acutely psychotic, paranoid, delusional, or hallucinating  Though process linear/goal-directed, thought content normal, insight/judgment fair     Assessment & Plan:

## 2013-04-20 ENCOUNTER — Ambulatory Visit (INDEPENDENT_AMBULATORY_CARE_PROVIDER_SITE_OTHER): Payer: 59 | Admitting: Family Medicine

## 2013-04-20 ENCOUNTER — Encounter: Payer: Self-pay | Admitting: Family Medicine

## 2013-04-20 VITALS — BP 118/81 | HR 80 | Temp 98.6°F | Ht 64.5 in | Wt 146.0 lb

## 2013-04-20 DIAGNOSIS — F3289 Other specified depressive episodes: Secondary | ICD-10-CM

## 2013-04-20 DIAGNOSIS — F32A Depression, unspecified: Secondary | ICD-10-CM

## 2013-04-20 DIAGNOSIS — F329 Major depressive disorder, single episode, unspecified: Secondary | ICD-10-CM

## 2013-04-20 DIAGNOSIS — E039 Hypothyroidism, unspecified: Secondary | ICD-10-CM

## 2013-04-20 MED ORDER — LEVOTHYROXINE SODIUM 75 MCG PO TABS: 75.0000 ug | ORAL_TABLET | Freq: Every day | ORAL | Status: DC

## 2013-04-20 MED ORDER — QUETIAPINE FUMARATE 100 MG PO TABS: ORAL_TABLET | ORAL | Status: DC

## 2013-04-20 MED ORDER — QUETIAPINE FUMARATE 50 MG PO TABS: ORAL_TABLET | ORAL | Status: DC

## 2013-04-20 NOTE — Assessment & Plan Note (Signed)
A: Symptoms strongly suggestive of major depression, though current symptoms seem to be the first time pt has had problems with mood (present for about the last 3 months). Some possible confounding with thyroid disease, stress related to significant cardiac disease, and/or anemia presumed due to menorrhagia. Some improvement with Seroquel started at last appointment, though pt still reports significant fatigue and has frank flat affect, psychomotor retardation, and obvious dysphoric mood on exam/during interview. Pt unable to see Dr. Pascal Lux and unable to see other providers suggested by Dr. Pascal Lux due to 2 being outside of pt's ability to travel to other cities to see them, and the third does not take her insurance. No current SI/HI/AH/VH.  P: Continue Seroquel, uptitrating dose (50 mg today, 50 mg tomorrow, then 100 mg therafter), with possible further increases depending on response; Seroquel initially picked for some "racing thoughts," though no current symptoms to suggest mania could indicate switch to another agent. Pt given contact information for [facility] for counseling/therapy consideration, though could consider full psychiatric evaluation/care through their providers, as well, depending on response to Seroquel. Follow up in about 1 month. See also hypothyroidism problem list note.

## 2013-04-20 NOTE — Assessment & Plan Note (Signed)
Recently given Rx for 75 mcg tablets, though pt has not started them, yet (still has 4 tablets of 50 mcg). Could be contributing to mood symptoms, though last TSH was only ~5.5. Instructed pt to fill Rx for 75 mcg tablets, then will plan to check TSH in about 6 weeks. Will likely check CBC at that time, as well.

## 2013-04-20 NOTE — Patient Instructions (Addendum)
Thank you for coming in, today! I'm sorry you're still not feeling well.  I want to increase your Seroquel.  Take 50 mg tonight and tomorrow night  After that, take 100 mg at night.  You can use the 25 mg tablets you have until they're gone.  We still have room to increase this medication if needed.  We can also talk about using different medicines if this doesn't help.  You might also benefit from seeing a counselor or therapist.  Bonita Quin can try calling the [facility] here in [place].  They specialize in depression and similar problems.  They are located at [facility]   [address], [place] [address]   [phone]   Make sure to pick up the new strength of thyroid medicine.  You should start taking 75 mcg every day.  We will need to recheck your thyroid function after 6 weeks of the new dose.  Make an appointment to come back to see me in about one month. Please feel free to call with any questions or concerns at any time, at [phone]. --Dr. Casper Harrison

## 2013-04-20 NOTE — Progress Notes (Signed)
  Subjective:    Patient ID: Miranda [name], female    DOB: [birth year], 47 y.o.   MRN: [id number]  HPI: Pt presents to clinic for follow up of mood. Pt seen recently and reported severe dysphoric mood and some suicidal ideation (denies currently; see below). Started low-dose Seroquel at that time but this has helped only 'some' with her mood, and not much at all with her sleep. Pt's most distressing symptom is her tiredness and 'slow movement,' and lack of sleep bothers her the worst.  Report of symptoms (SIGECAPS): -positive for poor sleep (difficulty falling asleep and early-morning waking with difficulty falling asleep) -positive for lost of interest in activities/prior hobbies (reading, etc) -negative for frank feelings of guilt/worthlessness, though pt does endrose feelings of hopelessness -positive for lack of energy -positive for reduced ability to concentrate/focus on tasks -positive for occasional poor appetite -positive for psychomotor retardation -positive for occasional suicidal ideation "in the past few weeks," but NONE currently  Duration of symptoms: -started ~June/July of this year (3-4 months ago) -worse between June/July and August --> started Seroquel --> still present -of ntoe, pt states she had no symptoms between major hospitalization in March and July (see below)  Impact on function: severe; affects daily activities, sleep, energy during the day  Psychiatric Hx: none; no prior diagnosis or treatment -no mood disturbances as a teenager or young adult, prior to current symptoms -no prior hospitalizations related to mood  Family history of psychiatric issues: none to pt's knowledge Current and history of substance use: hx of tobacco and marijuana use ("cigars with pot mixed in"), but none in several months  Medical conditions that might explain or contribute to symptoms:  -hypothyroidism, currently on Synthroid  -last TSH mildly elevated, on 50 mcg daily  -has not  yet started 75 mcg tablets recently prescribed -extensive cardiac disease (see previous notes) with hospitalization earlier this year  -of note, pt stated she "was doing very well" between hospitalization in March and onset of symptoms in July  -pt states she is compliant with medications and has had no chest pain, follows up regularly with her cardiologist, etc -anemia - last Hb ~10, last checked in March, likely related to dysfunctional uterine bleeding (now on Depo-Provera per OBGYN, Dr. Marice Potter)  PHQ-9: score 25 (3 for questions 1-4, 6-8; 2 for question 5, 1 for question 9; rated as "very difficult")  Review of Systems: As above. Generally feels physically well, but very tired as above. Denies fever/chills, N/V, diarrhea, headache, change in vision. Denies active SI/HI, AH/VH, though states she has had some suicidal ideation in the past few weeks (none in the past several days, and pt had no specific plan when she did have suicidal ideation).     Objective:   Physical Exam BP 118/81  Pulse 80  Temp(Src) 98.6 F (37 C) (Oral)  Ht 5' 4.5" (1.638 m)  Wt 146 lb (66.225 kg)  BMI 24.68 kg/m2 Gen: well-appearing adult female in no acute distress, but pt very dysphoric; well-groomed with good personal hygiene Psych: mood depressed, very flat affect with generally poor eye contact  Moderate psychomotor retardation but no active psychosis, hallucination, delusion  Some more reactive mood/affect that previous visit, but still clearly dysphoric, as above Neuro: strength 5/5 in both upper and lower extremities, sensation grossly intact Neck: thyroid without nodularity or tenderness, no cervical lymphadenopathy Cardio: RRR, no murmur appreciated Pulm: CTAB, no wheezes Abd: soft, nontender, BS+     Assessment & Plan:

## 2013-05-02 ENCOUNTER — Ambulatory Visit (HOSPITAL_COMMUNITY)
Admission: RE | Admit: 2013-05-02 | Discharge: 2013-05-02 | Disposition: A | Discharge status: Home / Self Care | Payer: 59 | Source: Ambulatory Visit | Attending: Internal Medicine | Admitting: Internal Medicine

## 2013-05-02 ENCOUNTER — Encounter (HOSPITAL_COMMUNITY): Payer: Self-pay

## 2013-05-02 VITALS — BP 124/82 | HR 80 | Ht 64.5 in | Wt 148.0 lb

## 2013-05-02 DIAGNOSIS — I509 Heart failure, unspecified: Secondary | ICD-10-CM | POA: Insufficient documentation

## 2013-05-02 DIAGNOSIS — R42 Dizziness and giddiness: Secondary | ICD-10-CM | POA: Insufficient documentation

## 2013-05-02 DIAGNOSIS — I5042 Chronic combined systolic (congestive) and diastolic (congestive) heart failure: Secondary | ICD-10-CM | POA: Insufficient documentation

## 2013-05-02 NOTE — Patient Instructions (Addendum)
Follow up in 1 month to discuss your exercise test   Call us if your blood pressure is less than 90.  Do the following things EVERYDAY: 1) Weigh yourself in the morning before breakfast. Write it down and keep it in a log. 2) Take your medicines as prescribed 3) Eat low salt foods-Limit salt (sodium) to 2000 mg per day.  4) Stay as active as you can everyday 5) Limit all fluids for the day to less than 2 liters

## 2013-05-02 NOTE — Progress Notes (Signed)
Patient ID: Miranda [name], female   DOB: 1966/03/05, 47 y.o.   MRN: [id number]  GYN: Dr Shawnie Pons PCP: Dr Casper Harrison  HPI: Miranda [name] is a 47 y.o. female with history of HTN, NICM with EF 15-20%, chronic systolic and diastolic heart failure and iron deficiency anemia.  She also has a fibroid uterus.    Admitted to [facility] 10/25/12 through 11/01/12 with new systolic heart failure. Pertinent labs included Hgb 6.5, hematocrit 23.9, d-dimer 2.62 BP 182/109 and HR 111. EKG showed LVH but no acute changes.  Anemia felt to be secondary to chronic blood loss from fibroid uterus as well as iron deficiency.  She was placed on transitioned to Lasix, Coreg, Lisinopril and ASA.  Discharge weight 115 pounds.   10/26/12 ECHO  EF 15-20%. There was diffuse hypokinesis. Grade 2 diastolic dysfunction, mild MR, moderate-severely dilatated LA.   RHC/LHC 11/18/2012:  RA 5  RV 35/5 (6)  PA 35/19 (26)  PCWP 17  Fick CO/CI 3.66/2.33  Thermo CO/CI 3.57/2.27  PA O2 57%  FA O2 97%   Angiographically normal coronary arteries with only minimal luminal irregularities in the RCA system   01/24/13 ECHO EF 35-40%, Life vest removed.  She returns for follow up. Complains dizziness and headache. Still gets quite short of breath with exertion.  +Orthopnea sleeps on 2 pillows. Weight at home 142-145 pounds which is stable. She is taking lasix daily for weight > 141 pounds.  SBP 97- mid 100s. Compliant with medications. Thyroid medications increased 2 weeks ago. PCP increased seroquel to assist with sleep.    Labs (4/14): K 4.1, creatinine 0.6 Labs (7/28) K 3.9 , creatinine 0.63 Pro BNP 70 Labs (8/20) TSH 5.554   ROS: All systems negative except as listed in HPI, PMH and Problem List.  Past Medical History  Diagnosis Date  . Hypertension   . At risk for sudden cardiac death 11/18/12  . CHF - Combined Systolic + Diastolic Dysfunction. EF of 15-20% with Grade II diastolic dysfunction on echo 10/27/12 10/28/2012  . S/P cardiac  catheterization, 11-18-2012, normal coronaries with minimal luminal irregularities in RCA system 10/30/2012  . Thyroid disease     hypothyroidism    Current Outpatient Prescriptions  Medication Sig Dispense Refill  . acetaminophen (TYLENOL) 325 MG tablet Take 2 tablets (650 mg total) by mouth every 4 (four) hours as needed.      Marland Kitchen aspirin EC 81 MG EC tablet Take 1 tablet (81 mg total) by mouth daily.      Marland Kitchen atorvastatin (LIPITOR) 10 MG tablet Take 1 tablet (10 mg total) by mouth daily at 6 PM.  30 tablet  6  . carvedilol (COREG) 6.25 MG tablet Take 12.5 mg by mouth. Take 1/2 tab by mouth twice daily      . ferrous gluconate (FERGON) 324 MG tablet Take 1 tablet (324 mg total) by mouth 2 (two) times daily with a meal.  60 tablet  3  . furosemide (LASIX) 40 MG tablet Take 1 tablet (40 mg total) by mouth as needed (Only for weight 141 pounds or greater).  30 tablet  3  . levothyroxine (SYNTHROID, LEVOTHROID) 75 MCG tablet Take 1 tablet (75 mcg total) by mouth daily before breakfast.  30 tablet  1  . lisinopril (PRINIVIL,ZESTRIL) 2.5 MG tablet Take 2.5 mg by mouth 2 (two) times daily.      . potassium chloride SA (K-DUR,KLOR-CON) 20 MEQ tablet Take 20 mEq by mouth 2 (two) times daily.      Marland Kitchen  QUEtiapine (SEROQUEL) 100 MG tablet Take 50 mg on 9/17 and 9/18 at bedtime. After that, take 100 mg at bedtime.  60 tablet  1   Current Facility-Administered Medications  Medication Dose Route Frequency Provider Last Rate Last Dose  . medroxyPROGESTERone (DEPO-PROVERA) injection 150 mg  150 mg Intramuscular Q90 days Allie Bossier, MD   150 mg at 03/10/13 1452     PHYSICAL EXAM: Filed Vitals:   05/02/13 1008  BP: 124/82  Pulse: 80  Height: 5' 4.5" (1.638 m)  Weight: 148 lb (67.132 kg)  SpO2: 100%    General: Well-appearing ambulates without dyspnea. HEENT: normal Neck: supple. JVP 5-6. Carotids 2+ bilaterally; no bruits. No lymphadenopathy or thryomegaly appreciated. Cor: PMI normal. Regular rate &  rhythm. No rubs, gallops. 2/6 HSM apex.   Lungs: clear Abdomen: soft, nontender, nondistended. No hepatosplenomegaly. No bruits or masses. Good bowel sounds. Extremities: no cyanosis, clubbing, rash, edema Neuro: alert & orientedx3, cranial nerves grossly intact. +lateral nystagmus. Moves all 4 extremities w/o difficulty. Affect pleasant.   ASSESSMENT & PLAN: 1. Chronic systolic CHF: Nonischemic cardiomyopathy.  Last echo with EF improving 35-40% 01/24/13  (closer to 40%) so no ICD placed.   -However remains with NYHA class III symptoms. - Volume status stable despite weight gain. Continue lasix 40 mg daily  - Take Coreg 6.25 mg bid -Continue  lisinopril 2.5 mg twice a day.  We have asked her to check her blood pressure daily and call if her weight  Is less than 90.  -Check CPX.   2. Hyperlipidemia: On atorvastatin.   3. Hypothyroid:  Per PCP. Synthroid increased to 75 mcg per day 2 weeks ago  4. Anemia On iron. Offered to check cbc however she declines and she wants PCP to check.  5. Dizziness BP ok . Sitting BP 116/78 Standing 110/78. + lateral nystagmus on exam  Refer to neurology   Follow up in 1 month to discuss CPX.   CLEGG,AMY 05/02/2013  Patient seen and examined with Tonye Becket, NP. We discussed all aspects of the encounter. I agree with the assessment and plan as stated above. Her ejection fraction has improved but remains NYHA III. Will get CPX test to further evaluate functional capacity and severity of HF. She continues to be dizzy even while lying in bed and thus I doubt this is primarily a cardiac issue. She is not orthostatic. She has lateral nystagmus on exam. Will refer to Neurology to see if they can help her. Blood pressure remains soft. She will check BP daily and call us if SBP < 90. We will not change her HF meds today.   Aleta Manternach,MD 2:21 PM

## 2013-05-09 ENCOUNTER — Other Ambulatory Visit: Payer: 59

## 2013-05-10 ENCOUNTER — Other Ambulatory Visit (HOSPITAL_COMMUNITY): Payer: Self-pay

## 2013-05-10 ENCOUNTER — Ambulatory Visit (HOSPITAL_COMMUNITY): Payer: 59

## 2013-05-10 DIAGNOSIS — I5043 Acute on chronic combined systolic (congestive) and diastolic (congestive) heart failure: Secondary | ICD-10-CM

## 2013-05-10 DIAGNOSIS — I5042 Chronic combined systolic (congestive) and diastolic (congestive) heart failure: Secondary | ICD-10-CM

## 2013-05-10 DIAGNOSIS — I509 Heart failure, unspecified: Secondary | ICD-10-CM

## 2013-05-11 ENCOUNTER — Encounter: Payer: Self-pay | Admitting: Diagnostic Neuroimaging

## 2013-05-11 ENCOUNTER — Ambulatory Visit (INDEPENDENT_AMBULATORY_CARE_PROVIDER_SITE_OTHER): Payer: 59 | Admitting: Diagnostic Neuroimaging

## 2013-05-11 VITALS — BP 118/80 | HR 79 | Temp 98.5°F | Ht 64.5 in | Wt 157.0 lb

## 2013-05-11 DIAGNOSIS — R42 Dizziness and giddiness: Secondary | ICD-10-CM

## 2013-05-11 NOTE — Progress Notes (Signed)
[facility]  PATIENT: Miranda [name] DOB: Dec 19, 1965  REFERRING CLINICIAN: [email or web address] HISTORY FROM: patient REASON FOR VISIT: new consult   HISTORICAL  CHIEF COMPLAINT:  Chief Complaint  Patient presents with  . Dizziness    HISTORY OF PRESENT ILLNESS:   47 year old right-handed female here for evaluation of dizziness. Patient has history of hypertension, new diagnosis of congestive heart failure in March 2014. Ever since her diagnosis patient has felt "dizzy" everyday. She denies any spinning sensation. She has some lightheadedness. When she walks she feels off-balance. No nausea or vomiting. No slurred speech or trouble talking. 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 I asked her she's never been diagnosed with "lazy eye" that she's not sure. Patient was noted to have nystagmus by the cardiologist and therefore referred to me for further evaluation.  Patient denies any unilateral numbness or weakness, facial droop, trouble swallowing or headache.  REVIEW OF SYSTEMS: Full 14 system review of systems performed and notable only for chills fatigue chest pain short this of breath constipation feeling hot feeling cold headache dizziness depression decreased energy disinterest in activities.  ALLERGIES: Allergies  Allergen Reactions  . Codeine Hives    HOME MEDICATIONS: Outpatient Prescriptions Prior to Visit  Medication Sig Dispense Refill  . acetaminophen (TYLENOL) 325 MG tablet Take 2 tablets (650 mg total) by mouth every 4 (four) hours as needed.      Marland Kitchen aspirin EC 81 MG EC tablet Take 1 tablet (81 mg total) by mouth daily.      Marland Kitchen atorvastatin (LIPITOR) 10 MG tablet Take 1 tablet (10 mg total) by mouth daily at 6 PM.  30 tablet  6  . carvedilol (COREG) 6.25 MG tablet Take 12.5 mg by mouth. Take 1/2 tab by mouth twice daily      . ferrous gluconate (FERGON) 324 MG tablet Take 1 tablet (324 mg total) by mouth 2 (two) times daily with a meal.  60  tablet  3  . furosemide (LASIX) 40 MG tablet Take 1 tablet (40 mg total) by mouth as needed (Only for weight 141 pounds or greater).  30 tablet  3  . levothyroxine (SYNTHROID, LEVOTHROID) 75 MCG tablet Take 1 tablet (75 mcg total) by mouth daily before breakfast.  30 tablet  1  . lisinopril (PRINIVIL,ZESTRIL) 2.5 MG tablet Take 2.5 mg by mouth 2 (two) times daily.      . potassium chloride SA (K-DUR,KLOR-CON) 20 MEQ tablet Take 20 mEq by mouth 2 (two) times daily.      . QUEtiapine (SEROQUEL) 100 MG tablet Take 50 mg on 9/17 and 9/18 at bedtime. After that, take 100 mg at bedtime.  60 tablet  1   Facility-Administered Medications Prior to Visit  Medication Dose Route Frequency Provider Last Rate Last Dose  . medroxyPROGESTERone (DEPO-PROVERA) injection 150 mg  150 mg Intramuscular Q90 days Allie Bossier, MD   150 mg at 03/10/13 1452    PAST MEDICAL HISTORY: Past Medical History  Diagnosis Date  . Hypertension   . At risk for sudden cardiac death 2012-11-20  . CHF - Combined Systolic + Diastolic Dysfunction. EF of 15-20% with Grade II diastolic dysfunction on echo 10/27/12 10/28/2012  . S/P cardiac catheterization, 11/20/12, normal coronaries with minimal luminal irregularities in RCA system 10/30/2012  . Thyroid disease     hypothyroidism    PAST SURGICAL HISTORY: History reviewed. No pertinent past surgical history.  FAMILY HISTORY: Family History  Problem Relation Age of Onset  .  Cancer Paternal Grandfather     Bone  . Hypertension Maternal Grandmother   . Hypertension Mother   . Breast cancer Cousin   . Cancer Maternal Uncle     bone    SOCIAL HISTORY:  History   Social History  . Marital Status: Single    Spouse Name: N/A    Number of Children: 1  . Years of Education: 12th   Occupational History  .  [employer]   Social History Main Topics  . Smoking status: Former Smoker    Types: Cigars    Quit date: 10/24/2012  . Smokeless tobacco: Never Used  . Alcohol  Use: No  . Drug Use: No     Comment: Previously used marijuana; quit 10/25/12  . Sexual Activity: Not Currently   Other Topics Concern  . Not on file   Social History Narrative   Patient lives at home alone.   Caffeine use: 1/2 soda daily     PHYSICAL EXAM  Filed Vitals:   05/11/13 0947 05/11/13 0957 05/11/13 0958  BP:  141/80 118/80  Pulse:  70 79  Temp: 98.5 F (36.9 C)    TempSrc: Oral    Height: 5' 4.5" (1.638 m)    Weight: 157 lb (71.215 kg)      Not recorded    Body mass index is 26.54 kg/(m^2).  GENERAL EXAM: Patient is in no distress  CARDIOVASCULAR: Regular rate and rhythm, no murmurs, no carotid bruits  NEUROLOGIC: MENTAL STATUS: awake, alert, language fluent, comprehension intact, naming intact CRANIAL NERVE: no papilledema on fundoscopic exam, pupils equal and reactive to light, visual fields full to confrontation, EXOTROPIA ON PRIMARY GAZE, SUSTAINED NYSTAGMUS ON LEFT > RIGHT GAZE; MILD NYSTAGMUS ON UP AND DOWN GAZE. DECR ADDUCTION OF RIGHT EYE ON LEFT GAZE. NO SUBJECTIVE DIPLOPIA ON GAZE IN ANY DIRECTION. SACCADIC DYSMETRIA ON LATERAL GAZE. Facial sensation and strength symmetric, uvula midline, shoulder shrug symmetric, tongue midline. MOTOR: normal bulk and tone, full strength in the BUE, BLE SENSORY: normal and symmetric to light touch, pinprick, temperature, vibration COORDINATION: finger-nose-finger, fine finger movements SLOW. REFLEXES: deep tendon reflexes present and symmetric GAIT/STATION: narrow based gait; SLOW, SHORT STEPS. STOOPED POSTURE. DIFF WITH TANDEM. Romberg is negative   DIAGNOSTIC DATA (LABS, IMAGING, TESTING) - I reviewed patient records, labs, notes, testing and imaging myself where available.  Lab Results  Component Value Date   WBC 5.8 10/29/2012   HGB 10.3* 10/29/2012   HCT 34.9* 10/29/2012   MCV 70.4* 10/29/2012   PLT 199 10/29/2012      Component Value Date/Time   NA 137 02/28/2013 1130   K 3.9 02/28/2013 1130   CL 104  02/28/2013 1130   CO2 23 02/28/2013 1130   GLUCOSE 86 02/28/2013 1130   BUN 9 02/28/2013 1130   CREATININE 0.63 02/28/2013 1130   CALCIUM 9.4 02/28/2013 1130   PROT 7.7 10/25/2012 1306   ALBUMIN 2.9* 10/25/2012 1306   AST 34 10/25/2012 1306   ALT 13 10/25/2012 1306   ALKPHOS 72 10/25/2012 1306   BILITOT 0.2* 10/25/2012 1306   GFRNONAA >90 02/28/2013 1130   GFRAA >90 02/28/2013 1130   Lab Results  Component Value Date   CHOL 177 10/26/2012   HDL 41 10/26/2012   LDLCALC 113* 10/26/2012   TRIG 115 10/26/2012   CHOLHDL 4.3 10/26/2012   Lab Results  Component Value Date   HGBA1C 5.6 10/28/2012   Lab Results  Component Value Date   VITAMINB12 546 10/25/2012  Lab Results  Component Value Date   TSH 5.554* 03/23/2013   I reviewed images myself and agree with interpretation.  10/25/12 CT ANGIO CHEST 1. Cardiomegaly and changes of congestive heart failure with  bilateral pleural effusions.  2. Mild bibasilar atelectasis without definite pneumonia.  3. Small pericardial effusion.  4. Mild bilateral axillary adenopathy.  5. No pulmonary emboli.    ASSESSMENT AND PLAN  47 y.o. year old female here with CHF, HTN, with new onset dizziness at the same time as her diagnosis of CHF. Certainly her heart issues and orthostatic hypotension on vitals today could account for some generalized dizziness. Her abnormal neurologic exam (eye movements as above), could localize to a new cerebellar or brainstem problem. Interestingly, she does not feel symptomatic on eye movement exam (no subjective oscillopsia or diplopia) which suggests that the eye movements may be more chronic or even congenital. In any case, we will proceed with TIA/stroke workup.  Ddx: cardiogenic, neurogenic, stroke, vertebrobasilar insufficiency  PLAN: - MRI BRAIN, MRA HEAD/NECK - follow up with cardiology re: orthostatic vital signs (141/85, hr 76 laying --> 118/80, hr 79 standing)   Orders Placed This Encounter  Procedures  . MR  Brain Wo Contrast  . MR MRA HEAD WO CONTRAST  . MR Angiogram Neck W Wo Contrast    No orders of the defined types were placed in this encounter.    Return in about 3 months (around 08/11/2013).  I reviewed images, labs, notes, records myself. I summarized findings and reviewed with patient, for this high risk conditions (possible stroke/TIA, intermittent hypotension, CHF) requiring high complexity decision making.   Suanne Marker, MD 05/11/2013, 11:13 AM Certified in Neurology, Neurophysiology and Neuroimaging  [facility] [address] [place], [place] [address] [phone]

## 2013-05-19 ENCOUNTER — Encounter: Payer: Self-pay | Admitting: Family Medicine

## 2013-05-19 ENCOUNTER — Ambulatory Visit (INDEPENDENT_AMBULATORY_CARE_PROVIDER_SITE_OTHER): Payer: 59 | Admitting: Family Medicine

## 2013-05-19 VITALS — BP 114/68 | HR 85 | Temp 99.2°F | Ht 64.5 in | Wt 147.0 lb

## 2013-05-19 DIAGNOSIS — E039 Hypothyroidism, unspecified: Secondary | ICD-10-CM

## 2013-05-19 DIAGNOSIS — F329 Major depressive disorder, single episode, unspecified: Secondary | ICD-10-CM

## 2013-05-19 DIAGNOSIS — F32A Depression, unspecified: Secondary | ICD-10-CM

## 2013-05-19 DIAGNOSIS — R42 Dizziness and giddiness: Secondary | ICD-10-CM

## 2013-05-19 DIAGNOSIS — F3289 Other specified depressive episodes: Secondary | ICD-10-CM

## 2013-05-19 DIAGNOSIS — D649 Anemia, unspecified: Secondary | ICD-10-CM

## 2013-05-19 NOTE — Patient Instructions (Signed)
Thank you for coming in, today!  I'm sorry you're still having trouble with sleep and interest in things. I want you to keep taking the Seroquel. Talk to the psychiatrist/psychologist next month and ask them about therapy. Also think about going to [facility] to talk to a therapist. I think that may be a big help for you.  Make a lab draw appointment in two weeks. Make sure to get your MRI next week and follow up with neurology. Come back to see me after you see the psychiatrist/psychologist next month.   Please feel free to call with any questions or concerns at any time, at [phone]. --Dr. Casper Harrison

## 2013-05-21 NOTE — Assessment & Plan Note (Signed)
A: Symptoms still more suggestive of unipolar depression more than some form of bipolar disorder. Some improvement with Seroquel, though definite confounder of significant other medical disease (heart failure, especially), thyroid dysfunction, and psychosocial stress. Still has not seen a therapist but has upcoming appointment likely with psychologist for disability application reasons.  P: Continue Seroquel, now 100 mg per day. Strongly recommended that pt find a therapist and reiterated providers at [facility]; provider for disability assessment may be of help, as well (?may be able to suggest therapist/counselor services to pt). Reluctant to add medication or change medication; I feel that multimodal treatment will be of more benefit than just using pharmaceuticals. Plan to f/u in 1-2 months.

## 2013-05-21 NOTE — Assessment & Plan Note (Signed)
Still with some fatigue, though perhaps vaguely improved. On Depo-Provera to help control menorrhagia, per OBGYN providers. Plan to recheck CBC with blood draw for TSH, in 2-4 weeks. F/u as needed.

## 2013-05-21 NOTE — Assessment & Plan Note (Addendum)
Uncertain etiology, and somewhat vaguely described. Non-focal neuro exam and no obvious difficulty with ambulation/sitting/standing/changing position for exam. Following up with neurology, with MRI pending. Appreciate specialist evaluation and care; per neuro notes, suspicion for stroke vs TIA vs ?form of congenital nystagmus. Will hold off on medication such as meclizine (premature with work-up ongoing, and pt reluctant to start "more pills," anyway).

## 2013-05-21 NOTE — Assessment & Plan Note (Addendum)
Thyroid dysfunction may be contributing to depression symptoms/fatigue/etc, though expect pt has significant primary psychiatric dysfunction regardless. Now on Synthroid 75 mcg per day. Plan to recheck TSH in another 2-4 weeks. F/u PRN.

## 2013-05-21 NOTE — Progress Notes (Signed)
  Subjective:    Patient ID: Miranda [name], female    DOB: Aug 28, 1965, 47 y.o.   MRN: [id number]  HPI: Pt presents to clinic for f/u on depression and hypothyroidism. Reports following up regularly with cardiologist for CHF / cardiomyopathy. Endorses some persistent dizziness (present "most of the time," not worse or better with any particular positioning) and is seeing a neurologist, with MRI pending in the next few weeks (referred to neurology by cardiologist for concern that dizziness is not related to heart disease).  Depression - reports compliance with Seroquel, with some improvement in symptoms, but still has little interest in "anything" -endorses less trouble falling asleep but does still wake up several times through the night -has not been seen or contacted [facility]; states she is scheduled to see "a psychiatrist or a psychologist or something" next month due to having applied for disability -pt describes difficulty talking about her issues and thinks therapy might help but has difficulty "motivating" herself to seek out providers -denies SI/HI, AH/VH  Hypothyroidism - now taking 75 mcg Synthroid per day, started about 3 and a half weeks ago -states some of her depression symptoms may be related to low energy, which has been some better with thyroid treatment -admits she is unsure how much her symptoms are different and how much is attributable to which medicines (or "just time")  Review of Systems: As above.     Objective:   Physical Exam BP 114/68  Pulse 85  Temp(Src) 99.2 F (37.3 C) (Oral)  Ht 5' 4.5" (1.638 m)  Wt 147 lb (66.679 kg)  BMI 24.85 kg/m2 Gen: well-appearing adult female in NAD Neuro: no gross focal deficit of strength or sensation  Notable nystagmus on lateral gaze but no obvious visual deficit Psych: mild psychomotor retardation, similar to previous visits; very quiet, poor eye contact but some improved reactivity of affect  Mood depressed with congruent  flattened/blunted affect Cardio: RRR, soft systolic murmur at apex Pulm: CTAB, no wheezes appreciated Ext: warm, well-perfused, distal pulses intact     Assessment & Plan:

## 2013-05-26 ENCOUNTER — Other Ambulatory Visit: Payer: Self-pay | Admitting: Diagnostic Neuroimaging

## 2013-05-26 ENCOUNTER — Ambulatory Visit (INDEPENDENT_AMBULATORY_CARE_PROVIDER_SITE_OTHER): Payer: 59

## 2013-05-26 DIAGNOSIS — R42 Dizziness and giddiness: Secondary | ICD-10-CM

## 2013-05-27 ENCOUNTER — Ambulatory Visit (INDEPENDENT_AMBULATORY_CARE_PROVIDER_SITE_OTHER): Payer: 59 | Admitting: General Practice

## 2013-05-27 ENCOUNTER — Telehealth (HOSPITAL_COMMUNITY): Payer: Self-pay | Admitting: Cardiology

## 2013-05-27 VITALS — BP 109/76 | HR 83 | Temp 99.2°F | Ht 64.0 in | Wt 150.6 lb

## 2013-05-27 DIAGNOSIS — N949 Unspecified condition associated with female genital organs and menstrual cycle: Secondary | ICD-10-CM

## 2013-05-27 DIAGNOSIS — N938 Other specified abnormal uterine and vaginal bleeding: Secondary | ICD-10-CM

## 2013-05-27 DIAGNOSIS — N925 Other specified irregular menstruation: Secondary | ICD-10-CM

## 2013-05-27 DIAGNOSIS — D259 Leiomyoma of uterus, unspecified: Secondary | ICD-10-CM

## 2013-05-27 DIAGNOSIS — J439 Emphysema, unspecified: Secondary | ICD-10-CM

## 2013-05-27 DIAGNOSIS — R0602 Shortness of breath: Secondary | ICD-10-CM

## 2013-05-27 MED ORDER — MEDROXYPROGESTERONE ACETATE 104 MG/0.65ML ~~LOC~~ SUSP
104.0000 mg | Freq: Once | SUBCUTANEOUS | Status: AC
  Administered 2013-05-27: 104 mg via SUBCUTANEOUS

## 2013-05-27 NOTE — Telephone Encounter (Signed)
Message copied by Shay Bartoli, Milagros Reap on Fri May 27, 2013 11:04 AM ------      Message from: Dolores Patty      Created: Sun May 15, 2013  8:52 PM       Yes. Refer to pulmonary. Get CT chest with contrast       ------

## 2013-05-27 NOTE — Telephone Encounter (Signed)
Prior authorization obtained for [id number] Valid 10/22-12/6 Ready to schedule  [facility] for pt

## 2013-05-30 ENCOUNTER — Other Ambulatory Visit (HOSPITAL_COMMUNITY): Payer: Self-pay | Admitting: Cardiology

## 2013-05-30 DIAGNOSIS — I509 Heart failure, unspecified: Secondary | ICD-10-CM

## 2013-05-30 MED ORDER — POTASSIUM CHLORIDE CRYS ER 20 MEQ PO TBCR: 20.0000 meq | EXTENDED_RELEASE_TABLET | Freq: Two times a day (BID) | ORAL | Status: DC

## 2013-05-30 MED ORDER — ATORVASTATIN CALCIUM 10 MG PO TABS: 10.0000 mg | ORAL_TABLET | Freq: Every day | ORAL | Status: DC

## 2013-05-30 NOTE — Telephone Encounter (Signed)
Requested Prescriptions   Signed Prescriptions Disp Refills  . potassium chloride SA (K-DUR,KLOR-CON) 20 MEQ tablet 60 tablet 6    Sig: Take 1 tablet (20 mEq total) by mouth 2 (two) times daily.    Authorizing Provider: Dolores Patty    Ordering User: Omri Bertran, Milagros Reap  . atorvastatin (LIPITOR) 10 MG tablet 30 tablet 6    Sig: Take 1 tablet (10 mg total) by mouth daily at 6 PM.    Authorizing Provider: Dolores Patty    Ordering User: Daritza Brees, Milagros Reap

## 2013-05-31 NOTE — Progress Notes (Signed)
Patient ID: Miranda [name], female   DOB: 1966-01-06, 47 y.o.   MRN: [id number]  GYN: Dr Shawnie Pons PCP: Dr Casper Harrison HPI: Miranda [name] is a 47 y.o. female with history of HTN, NICM with EF 15-20%, chronic systolic and diastolic heart failure and iron deficiency anemia.  She also has a fibroid uterus.    Admitted to [facility] 10/25/12 through 11/01/12 with new systolic heart failure. Pertinent labs included Hgb 6.5, hematocrit 23.9, d-dimer 2.62 BP 182/109 and HR 111. EKG showed LVH but no acute changes.  Anemia felt to be secondary to chronic blood loss from fibroid uterus as well as iron deficiency.  She was placed on transitioned to Lasix, Coreg, Lisinopril and ASA.  Discharge weight 115 pounds.   RHC/LHC 11/23/12:  RA 5  RV 35/5 (6)  PA 35/19 (26)  PCWP 17  Fick CO/CI 3.66/2.33  Thermo CO/CI 3.57/2.27  PA O2 57%  FA O2 97%   Angiographically normal coronary arteries with only minimal luminal irregularities in the RCA system   10/26/12 ECHO  EF 15-20%. There was diffuse hypokinesis. Grade 2 diastolic dysfunction, mild MR, moderate-severely dilatated LA.  01/24/13 ECHO EF 35-40%, Life vest removed.  Labs (4/14): K 4.1, creatinine 0.6 Labs (7/28) K 3.9 , creatinine 0.63 Pro BNP 70 Labs (8/20) TSH 5.554   CPX : Unable to complete due to low FEV1 (FEV1 34%, FVC 63%, ratio 53%). Referred to pulmonary. Plan for CT of chest  MRI of head showed probable arachnoid cyst with mass effect on cerebellum.   She returns for follow up. Complains of fatigue and dizziness. Says she is depressed. Staying bed all day. Weight at home 145-149 pounds. She taking lasix daily.  Denies SOB/PND. + Orthopnea sleeps on 2 pillows. Following low salt diet. SBP 96-119/DBP 63-79. Compliant with medications.   ROS: All systems negative except as listed in HPI, PMH and Problem List.  Past Medical History  Diagnosis Date  . Hypertension   . At risk for sudden cardiac death November 23, 2012  . CHF - Combined Systolic + Diastolic Dysfunction.  EF of 15-20% with Grade II diastolic dysfunction on echo 10/27/12 10/28/2012  . S/P cardiac catheterization, 11-23-12, normal coronaries with minimal luminal irregularities in RCA system 10/30/2012  . Thyroid disease     hypothyroidism    Current Outpatient Prescriptions  Medication Sig Dispense Refill  . acetaminophen (TYLENOL) 325 MG tablet Take 2 tablets (650 mg total) by mouth every 4 (four) hours as needed.      Marland Kitchen aspirin EC 81 MG EC tablet Take 1 tablet (81 mg total) by mouth daily.      Marland Kitchen atorvastatin (LIPITOR) 10 MG tablet Take 1 tablet (10 mg total) by mouth daily at 6 PM.  30 tablet  6  . carvedilol (COREG) 6.25 MG tablet Take 12.5 mg by mouth. Take 1/2 tab by mouth twice daily      . ferrous gluconate (FERGON) 324 MG tablet Take 1 tablet (324 mg total) by mouth 2 (two) times daily with a meal.  60 tablet  3  . furosemide (LASIX) 40 MG tablet Take 1 tablet (40 mg total) by mouth as needed (Only for weight 141 pounds or greater).  30 tablet  3  . levothyroxine (SYNTHROID, LEVOTHROID) 75 MCG tablet Take 1 tablet (75 mcg total) by mouth daily before breakfast.  30 tablet  1  . lisinopril (PRINIVIL,ZESTRIL) 2.5 MG tablet Take 2.5 mg by mouth 2 (two) times daily.      . potassium chloride  SA (K-DUR,KLOR-CON) 20 MEQ tablet Take 1 tablet (20 mEq total) by mouth 2 (two) times daily.  60 tablet  6  . QUEtiapine (SEROQUEL) 100 MG tablet Take 50 mg on 9/17 and 9/18 at bedtime. After that, take 100 mg at bedtime.  60 tablet  1   Current Facility-Administered Medications  Medication Dose Route Frequency Provider Last Rate Last Dose  . medroxyPROGESTERone (DEPO-PROVERA) injection 150 mg  150 mg Intramuscular Q90 days Allie Bossier, MD   150 mg at 03/10/13 1452     PHYSICAL EXAM: Filed Vitals:   06/01/13 0930  BP: 122/86  Pulse: 98  Weight: 150 lb 1.6 oz (68.085 kg)  SpO2: 98%    General: Chronically ill -appearing ambulates without dyspnea. HEENT: normal Neck: supple. JVP 5-6. Carotids 2+  bilaterally; no bruits. No lymphadenopathy or thryomegaly appreciated. Cor: PMI normal. Regular rate & rhythm. No rubs, gallops. 2/6 HSM apex.   Lungs: clear Abdomen: soft, nontender, nondistended. No hepatosplenomegaly. No bruits or masses. Good bowel sounds. Extremities: no cyanosis, clubbing, rash, edema Neuro: alert & orientedx3, cranial nerves grossly intact. +lateral nystagmus. Moves all 4 extremities w/o difficulty. Affect pleasant.   ASSESSMENT & PLAN: 1. Chronic systolic CHF: Nonischemic cardiomyopathy.  Last echo with EF improving 35-40% 01/24/13  (closer to 40%) so no ICD placed. She has stable NYHA class III symptoms.  She is not volume overloaded on exam.  - Continue lasix 40 mg daily  - Continue Coreg 6.25 mg bid. Intolerant up titration due to dizziness. Continue  lisinopril 2.5 mg twice a day. I will hold off on uptitration of medications today as she is still having trouble with "dizziness."  - BMET to be done at [facility] [place] office tomorrow.  - CPX : Unable to complete due to low FEV1. Referred to pulmonary. Plan for CT of chest next week. Pulmonary referral pending.  2. Hyperlipidemia: On atorvastatin.  3. Hypothyroidism: Per PCP.  4. Anemia: On iron. CBC scheduled for tomorrow at PCP.  5. Dizziness: Followup with Neurology.  MRI of head showed possible arachnoid cyst with some mass effect on cerebellum.  It is possible that this is causing her dizziness. She has had no report from neurology about this yet, will ask that they contact her with followup plan.  6. Depression:  Seems rather profound.  Per PCP. 7. Abnormal PFTs: Spirometry at CPX suggested significant obstructive airways disease.  Prior smoker.  Plan for CT chest and appointment with pulmonary.  Follow up in 3 months   CLEGG,AMY 06/01/2013  Patient seen with NP, agree with the above plan.  She still is quite fatigued.  She is depressed.  She still has "dizzy spells."  - Continue current cardiac meds: will not  uptitrate BP active meds as she continues to have dizzy spells and blood pressure is soft at times.  - Needs followup with neurology regarding possible arachnoid cyst causing mass effect on cerebellum that may be contributing to dizziness.  - Needs PCP followup to address depression.  - Abnormal spirometry suggestive of significant obstructive airways disease.  I will refer to pulmonary and will get chest CT.   Marca Ancona 06/01/2013

## 2013-06-01 ENCOUNTER — Ambulatory Visit (HOSPITAL_COMMUNITY)
Admission: RE | Admit: 2013-06-01 | Discharge: 2013-06-01 | Disposition: A | Discharge status: Home / Self Care | Payer: 59 | Source: Ambulatory Visit | Attending: Internal Medicine | Admitting: Internal Medicine

## 2013-06-01 ENCOUNTER — Encounter (HOSPITAL_COMMUNITY): Payer: Self-pay

## 2013-06-01 VITALS — BP 122/86 | HR 98 | Wt 150.1 lb

## 2013-06-01 DIAGNOSIS — D259 Leiomyoma of uterus, unspecified: Secondary | ICD-10-CM | POA: Insufficient documentation

## 2013-06-01 DIAGNOSIS — I509 Heart failure, unspecified: Secondary | ICD-10-CM | POA: Insufficient documentation

## 2013-06-01 DIAGNOSIS — I5042 Chronic combined systolic (congestive) and diastolic (congestive) heart failure: Secondary | ICD-10-CM | POA: Insufficient documentation

## 2013-06-01 DIAGNOSIS — J449 Chronic obstructive pulmonary disease, unspecified: Secondary | ICD-10-CM

## 2013-06-01 DIAGNOSIS — I10 Essential (primary) hypertension: Secondary | ICD-10-CM | POA: Insufficient documentation

## 2013-06-01 DIAGNOSIS — J4489 Other specified chronic obstructive pulmonary disease: Secondary | ICD-10-CM

## 2013-06-01 DIAGNOSIS — R42 Dizziness and giddiness: Secondary | ICD-10-CM

## 2013-06-01 DIAGNOSIS — D509 Iron deficiency anemia, unspecified: Secondary | ICD-10-CM | POA: Insufficient documentation

## 2013-06-01 MED ORDER — CARVEDILOL 6.25 MG PO TABS: 6.2500 mg | ORAL_TABLET | Freq: Two times a day (BID) | ORAL | Status: DC

## 2013-06-01 MED ORDER — FUROSEMIDE 40 MG PO TABS: 40.0000 mg | ORAL_TABLET | ORAL | Status: DC | PRN

## 2013-06-01 MED ORDER — LISINOPRIL 2.5 MG PO TABS: 2.5000 mg | ORAL_TABLET | Freq: Two times a day (BID) | ORAL | Status: DC

## 2013-06-01 NOTE — Patient Instructions (Signed)
Follow up in 3 month   Do the following things EVERYDAY: 1) Weigh yourself in the morning before breakfast. Write it down and keep it in a log. 2) Take your medicines as prescribed 3) Eat low salt foods-Limit salt (sodium) to 2000 mg per day.  4) Stay as active as you can everyday 5) Limit all fluids for the day to less than 2 liters 

## 2013-06-02 ENCOUNTER — Telehealth (HOSPITAL_COMMUNITY): Payer: Self-pay | Admitting: *Deleted

## 2013-06-02 ENCOUNTER — Other Ambulatory Visit: Payer: 59

## 2013-06-02 DIAGNOSIS — D649 Anemia, unspecified: Secondary | ICD-10-CM

## 2013-06-02 DIAGNOSIS — E039 Hypothyroidism, unspecified: Secondary | ICD-10-CM

## 2013-06-02 LAB — CBC WITH DIFFERENTIAL/PLATELET
Basophils Absolute: 0 10*3/uL (ref 0.0–0.1)
Basophils Relative: 0 % (ref 0–1)
Eosinophils Absolute: 0.2 10*3/uL (ref 0.0–0.7)
Eosinophils Relative: 4 % (ref 0–5)
HCT: 34.3 % — ABNORMAL LOW (ref 36.0–46.0)
Hemoglobin: 11.4 g/dL — ABNORMAL LOW (ref 12.0–15.0)
Lymphocytes Relative: 28 % (ref 12–46)
Lymphs Abs: 1.1 10*3/uL (ref 0.7–4.0)
MCH: 27.5 pg (ref 26.0–34.0)
MCHC: 33.2 g/dL (ref 30.0–36.0)
MCV: 82.7 fL (ref 78.0–100.0)
Monocytes Absolute: 0.4 10*3/uL (ref 0.1–1.0)
Monocytes Relative: 9 % (ref 3–12)
Neutro Abs: 2.4 10*3/uL (ref 1.7–7.7)
Neutrophils Relative %: 59 % (ref 43–77)
Platelets: 239 10*3/uL (ref 150–400)
RBC: 4.15 MIL/uL (ref 3.87–5.11)
RDW: 13.8 % (ref 11.5–15.5)
WBC: 4.1 10*3/uL (ref 4.0–10.5)

## 2013-06-02 LAB — TSH: TSH: 2.265 u[IU]/mL (ref 0.350–4.500)

## 2013-06-02 MED ORDER — CARVEDILOL 6.25 MG PO TABS: 3.1250 mg | ORAL_TABLET | Freq: Two times a day (BID) | ORAL | Status: DC

## 2013-06-02 NOTE — Telephone Encounter (Signed)
Received note from pharmacy to clarify Carvedilol instructions, they received a prescription yesterday for 6.25 mg take 1 tab by mouth bid. Take 1/2 tab by mouth bid, looking at chart not sure what dose pt should be on, discussed w/Amy Filbert Schilder, NP she states pt should be on 3.125 mg bid and pt request to have the 6.25 mg tabs, new rx sent to pharmacy for 6.25 mg take 1/2 tab bid

## 2013-06-02 NOTE — Progress Notes (Signed)
CBC WITH DIFF AND TSH DONE TODAY Miranda [name]

## 2013-06-07 ENCOUNTER — Ambulatory Visit (HOSPITAL_COMMUNITY)
Admission: RE | Admit: 2013-06-07 | Discharge: 2013-06-07 | Disposition: A | Discharge status: Home / Self Care | Payer: 59 | Source: Ambulatory Visit | Attending: Internal Medicine | Admitting: Internal Medicine

## 2013-06-07 ENCOUNTER — Encounter (HOSPITAL_COMMUNITY): Payer: Self-pay

## 2013-06-07 ENCOUNTER — Ambulatory Visit (HOSPITAL_COMMUNITY): Payer: 59

## 2013-06-07 DIAGNOSIS — R918 Other nonspecific abnormal finding of lung field: Secondary | ICD-10-CM | POA: Insufficient documentation

## 2013-06-07 DIAGNOSIS — R0602 Shortness of breath: Secondary | ICD-10-CM

## 2013-06-07 DIAGNOSIS — J439 Emphysema, unspecified: Secondary | ICD-10-CM

## 2013-06-07 DIAGNOSIS — I509 Heart failure, unspecified: Secondary | ICD-10-CM | POA: Insufficient documentation

## 2013-06-07 DIAGNOSIS — J90 Pleural effusion, not elsewhere classified: Secondary | ICD-10-CM | POA: Insufficient documentation

## 2013-06-07 MED ORDER — IOHEXOL 350 MG/ML SOLN
100.0000 mL | Freq: Once | INTRAVENOUS | Status: AC | PRN
  Administered 2013-06-07: 100 mL via INTRAVENOUS

## 2013-06-09 ENCOUNTER — Other Ambulatory Visit: Payer: Self-pay

## 2013-06-28 ENCOUNTER — Telehealth (HOSPITAL_COMMUNITY): Payer: Self-pay | Admitting: Cardiology

## 2013-06-28 NOTE — Telephone Encounter (Signed)
Pt called to request CT results Pt would like to go over results in detail

## 2013-06-29 ENCOUNTER — Telehealth (HOSPITAL_COMMUNITY): Payer: Self-pay | Admitting: Adult Health

## 2013-06-29 NOTE — Telephone Encounter (Signed)
CT of chest results provided. No PE but inflammation noted. We referred to pulmonary.   Instructed to follow up with Dr Vassie Loll 07/06/13 at 9:30.  Ms Kazlauskas verbalized understanding.   Avanna Sowder 5:18 PM

## 2013-06-29 NOTE — Telephone Encounter (Signed)
Tonye Becket, NP spoke w/pt regarding results

## 2013-07-06 ENCOUNTER — Ambulatory Visit (INDEPENDENT_AMBULATORY_CARE_PROVIDER_SITE_OTHER): Payer: 59 | Admitting: Pulmonary Disease

## 2013-07-06 ENCOUNTER — Ambulatory Visit: Payer: 59

## 2013-07-06 ENCOUNTER — Encounter: Payer: Self-pay | Admitting: Pulmonary Disease

## 2013-07-06 VITALS — BP 110/70 | HR 86 | Temp 98.1°F | Ht 66.5 in | Wt 158.0 lb

## 2013-07-06 DIAGNOSIS — J449 Chronic obstructive pulmonary disease, unspecified: Secondary | ICD-10-CM

## 2013-07-06 DIAGNOSIS — J4489 Other specified chronic obstructive pulmonary disease: Secondary | ICD-10-CM

## 2013-07-06 LAB — ANGIOTENSIN CONVERTING ENZYME: Angiotensin-Converting Enzyme: 10 U/L (ref 8–52)

## 2013-07-06 NOTE — Assessment & Plan Note (Signed)
You have airway obstruction -appears to be extrathoracic  The basal groundglass infiltrates is of unclear etiology -he does appear improved when compared to the CT scan from 3/14 I do not now how her positive ANA from the past factors into this. Blood work -Chk ANA, ACE level, ds-DNA, SS, RA factor, ESR Return in to assess spirometry - if this again shows a box pattern, will consider ENT evaluation or CT of her neck. Surprisingly she appears asymptomatic for this currently. She appeared somewhat hesitant to undergo more testing at the current time

## 2013-07-06 NOTE — Patient Instructions (Signed)
You have airway obstruction & signs of inflammation in your lungs Blood work today Return in to assess with breathing test if better

## 2013-07-06 NOTE — Progress Notes (Signed)
Subjective:    Patient ID: Miranda [name], female    DOB: 12/14/1965, 47 y.o.   MRN: [id number]  HPI 47 year old remote ex-smoker referred for evaluation of abnormal pulmonary function test. Spirometry done as a part of a cardiopulmonary stress test showed severe airway obstruction with FEV1 of 0.82-34%, FVC of 1.87-63% with a ratio 44. Flow volume loop showed a box pattern suggesting a fixed extrathoracic airway obstruction. She was Admitted to [facility] 3/24 - [id number] with new systolic heart failure. 10/26/12 ECHO EF 15-20%. There was diffuse hypokinesis. Grade 2 diastolic dysfunction, mild MR, moderate-severely dilated LA.  01/24/13 repeat ECHO EF 35-40%, Life vest removed  CT of chest on 06/06/13 showed Mixed airspace and interstitial infiltrate within the lung bases. This appears much improved compared to 10/2012 which was more consistent with heart failure. ANA positive 1 : 160 She smoked less than 10 pack years and quit in 1980. She smoked an occasional cigar but quit in 10/2012.  She denies dyspnea or wheezing. He denies frequent chest colds, orthopnea or pedal edema She does carry a history of depression and is maintained on Seroquel and has a flat affect.    Past Medical History  Diagnosis Date  . Hypertension   . At risk for sudden cardiac death 11-03-12  . CHF - Combined Systolic + Diastolic Dysfunction. EF of 15-20% with Grade II diastolic dysfunction on echo 10/27/12 10/28/2012  . S/P cardiac catheterization, 11-03-12, normal coronaries with minimal luminal irregularities in RCA system 10/30/2012  . Thyroid disease     hypothyroidism   No past surgical history on file.  Allergies  Allergen Reactions  . Codeine Hives    History   Social History  . Marital Status: Single    Spouse Name: N/A    Number of Children: 1  . Years of Education: 12th   Occupational History  . 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 Smoking status: Former Smoker -- 0.50  packs/day for 5 years    Types: Cigarettes, Cigars    Quit date: 08/04/1978  . Smokeless tobacco: Never Used     Comment: 1 cigar daily-quit 10/24/12    . Alcohol Use: No     Comment: quit 09/19/11  . Drug Use: No     Comment: Previously used marijuana; quit 10/25/12  . Sexual Activity: Not Currently   Other Topics Concern  . Not on file   Social History Narrative   Patient lives at home alone.   Caffeine use: 1/2 soda daily    Family History  Problem Relation Age of Onset  . Cancer Paternal Grandfather     Bone  . Hypertension Maternal Grandmother   . Hypertension Mother   . Breast cancer Cousin   . Cancer Maternal Uncle     bone    History   Social History  . Marital Status: Single    Spouse Name: N/A    Number of Children: 1  . Years of Education: 12th   Occupational History  . 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 Smoking status: Former Smoker -- 0.50 packs/day for 5 years    Types: Cigarettes, Cigars    Quit date: 08/04/1978  . Smokeless tobacco: Never Used     Comment: 1 cigar daily-quit 10/24/12    . Alcohol Use: No     Comment: quit 09/19/11  . Drug Use: No     Comment: Previously used marijuana; quit 10/25/12  . Sexual  Activity: Not Currently   Other Topics Concern  . Not on file   Social History Narrative   Patient lives at home alone.   Caffeine use: 1/2 soda daily    Review of Systems  Constitutional: Positive for appetite change. Negative for fever and unexpected weight change.  HENT: Negative for congestion, dental problem, ear pain, nosebleeds, postnasal drip, rhinorrhea, sinus pressure, sneezing, sore throat and trouble swallowing.   Eyes: Negative for redness and itching.  Respiratory: Positive for shortness of breath. Negative for cough, chest tightness and wheezing.   Cardiovascular: Positive for chest pain. Negative for palpitations and leg swelling.  Gastrointestinal: Negative for nausea and vomiting.   Genitourinary: Negative for dysuria.  Musculoskeletal: Negative for joint swelling.  Skin: Negative for rash.  Neurological: Positive for headaches.  Hematological: Does not bruise/bleed easily.  Psychiatric/Behavioral: Positive for dysphoric mood. The patient is not nervous/anxious.        Objective:   Physical Exam  Gen. Pleasant, well-nourished, in no distress, depressed affect ENT - no lesions, no post nasal drip, no stridor Neck: No JVD, no thyromegaly, no carotid bruits Lungs: no use of accessory muscles, no dullness to percussion, clear without rales or rhonchi  Cardiovascular: Rhythm regular, heart sounds  normal, no murmurs or gallops, no peripheral edema Abdomen: soft and non-tender, no hepatosplenomegaly, BS normal. Musculoskeletal: No deformities, no cyanosis or clubbing Neuro:  alert, non focal       Assessment & Plan:

## 2013-07-07 LAB — ANTI-NUCLEAR AB-TITER (ANA TITER): ANA Titer 1: 1:80 {titer} — ABNORMAL HIGH

## 2013-07-07 LAB — ANA: Anti Nuclear Antibody(ANA): POSITIVE — AB

## 2013-07-07 LAB — ANTI-DNA ANTIBODY, DOUBLE-STRANDED: ds DNA Ab: 18 IU/mL (ref <30)

## 2013-07-29 ENCOUNTER — Encounter: Payer: Self-pay | Admitting: Internal Medicine

## 2013-08-01 ENCOUNTER — Other Ambulatory Visit (HOSPITAL_COMMUNITY): Payer: Self-pay | Admitting: *Deleted

## 2013-08-01 MED ORDER — CARVEDILOL 6.25 MG PO TABS: 3.1250 mg | ORAL_TABLET | Freq: Two times a day (BID) | ORAL | Status: DC

## 2013-08-10 ENCOUNTER — Encounter: Payer: Self-pay | Admitting: Family Medicine

## 2013-08-10 ENCOUNTER — Ambulatory Visit (INDEPENDENT_AMBULATORY_CARE_PROVIDER_SITE_OTHER): Payer: 59 | Admitting: Family Medicine

## 2013-08-10 VITALS — BP 119/79 | HR 87 | Ht 66.5 in | Wt 156.0 lb

## 2013-08-10 DIAGNOSIS — F329 Major depressive disorder, single episode, unspecified: Secondary | ICD-10-CM

## 2013-08-10 DIAGNOSIS — F32A Depression, unspecified: Secondary | ICD-10-CM

## 2013-08-10 DIAGNOSIS — E039 Hypothyroidism, unspecified: Secondary | ICD-10-CM

## 2013-08-10 DIAGNOSIS — G930 Cerebral cysts: Secondary | ICD-10-CM

## 2013-08-10 DIAGNOSIS — J449 Chronic obstructive pulmonary disease, unspecified: Secondary | ICD-10-CM

## 2013-08-10 DIAGNOSIS — I5042 Chronic combined systolic (congestive) and diastolic (congestive) heart failure: Secondary | ICD-10-CM

## 2013-08-10 DIAGNOSIS — J4489 Other specified chronic obstructive pulmonary disease: Secondary | ICD-10-CM

## 2013-08-10 DIAGNOSIS — F3289 Other specified depressive episodes: Secondary | ICD-10-CM

## 2013-08-10 MED ORDER — QUETIAPINE FUMARATE 100 MG PO TABS: ORAL_TABLET | ORAL | Status: DC

## 2013-08-10 MED ORDER — LEVOTHYROXINE SODIUM 75 MCG PO TABS: 75.0000 ug | ORAL_TABLET | Freq: Every day | ORAL | Status: DC

## 2013-08-10 NOTE — Progress Notes (Signed)
Subjective:    Patient ID: Miranda [name], female    DOB: [birth year], 48 y.o.   MRN: [id number]  HPI: Pt presents to clinic for general f/u.  Depression: In general, pt feels Seroquel 100 mg daily has been helping her mood and sleep. She has increased from 1 to 1.25 tablets daily, herself, without being instructed to, as it has helped her sleep and helped her mood. She still wakes up at night, especially the past few nights. She denies unusual new symptoms or frank side effects. Generally feels less down and more interested; she feels like she is "coming out of her funk she's been in" for the past several months and thinks Seroquel is helping a lot.  Cardiac: combined systolic / diastolic heart failure and reduced EF s/p cardiac cath in March 2014, following at heart failure clinic. Remains on Lasix daily if her daily weight is over 140 lbs. Denies chest pain, LE edema, change in vision, palpitations, or SOB. Generally feels "okay from a heart standpoint." Is waiting for clinic to call her with her next appointment. Reports compliance with meds.  TSH: Reports compliance with Synthroid 75 mcg daily. Denies diarrhea, shakiness / jitteriness, headache, change in skin or hair.  Pulm: Referred to pulm for abnormal PFT's as part of cardiac testing; saw Dr. Elsworth Soho on 12/3 and was diagnosed with obstructive airway disease, but appears asymptomatic for it. Pulm is monitoring her chest CT since there was concern for mixed infiltrate / airspace disease, but this was improved. She denies SOB, chest pain, tachycardia, dyspnea / orthopnea.  Neuro: Following with neuro for nystagmus and occasional dizziness. sees neuro next week to f/u on cyst (noted to have left lateral posterior fossae cyst on MRI in October 2014, likely arachnoid cyst). Dizziness has been less bothersome, but still present occasionally.  Of note, she has been approved for disability; she saw a psychologist as part of the evaluation and states  they were supposed to send me paperwork but I have not received anything. She states some of these questions may have bearing on her depression / treatment and wants me to review them if / when I get them.  Pt is a former smoker. She stopped smoking after her hospitalization in March 2014. In addition to the above documentation, pt's PMH, surgical history, FH, and SH all reviewed and updated where appropriate in the EMR. I have also reviewed and updated the pt's allergies and current medications as appropriate.  Review of Systems: As above. Otherwise, full 12-system ROS was reviewed and all negative.     Objective:   Physical Exam BP 119/79  Pulse 87  Ht 5' 6.5" (1.689 m)  Wt 156 lb (70.761 kg)  BMI 24.80 kg/m2 Gen: non-toxic-appearing adult female in NAD HEENT: [place]/AT, sclerae/conjunctivae clear, no lid lag, EOMI, PERRLA   MMM, posterior oropharynx clear, no cervical lymphadenopathy  neck supple with full ROM, no masses appreciated; thyroid not enlarged  Cardio: RRR, soft systolic murmur at apex  Pulm: CTAB, no wheezes appreciated  Abd: soft, nondistended, BS+, no HSM Ext: warm/well-perfused, no cyanosis/clubbing/edema MSK: strength 5/5 in all four extremities, no frank joint deformity/effusion  normal ROM to all four extremities with no point muscle/bony tenderness in spine Neuro: no gross focal deficit of strength or sensation   Notable nystagmus unchanged from previous, on lateral gaze but no obvious visual deficit  Psych: mild psychomotor retardation, actually much improved from previous visits  Affect much less flat, pt occasionally smiling and laughed once,  eye contact improved  Mood slightly depressed but overall appears much better than previous visits     Assessment & Plan:

## 2013-08-10 NOTE — Patient Instructions (Signed)
Thank you for coming in, today!  I'm glad to see your mood is improving. I want you to start taking Seroquel 200 mg daily, to see if that helps your sleep. Make sure you follow up with your other doctors (neurology, pulmonology, cardiology) as directed. Contact the disability psychologist you saw and ask them to send [place] your information.  Keep taking your other medicines without any changes. We will plan to recheck your thyroid function in another 3 months or so. Come back to see me around March. If you need refills at any time, call your pharmacy.  Please feel free to call with any questions or concerns at any time, at [phone]. --Dr. Venetia Maxon

## 2013-08-10 NOTE — Assessment & Plan Note (Signed)
Noted on MRI in October 2014, likely arachnoid cyst with some mass effect on left lateral cerebellum. Pt following with neurology, with upcoming appointment very soon. Pt states dizziness has been better (part of what prompted the referral to neuro from cardiologist); nystagmus still present on exam but ?if this is congenital. Regardless, plan to f/u any further recommendations / work-up from neuro, as needed.

## 2013-08-10 NOTE — Assessment & Plan Note (Signed)
Last TSH check WNL, on Synthroid 75 mcg / day. Reports no symptoms suggestive of under- or overtreatment, and mood has generally been improved, as well. Continue current dose, consider recheck TSH in another few months.

## 2013-08-10 NOTE — Assessment & Plan Note (Signed)
Following regularly with HF clinic and reports compliance with diuretics and BP medications. Continue current medications per their orders and f/u with them as scheduled. No acute issues identified, currently.

## 2013-08-10 NOTE — Assessment & Plan Note (Signed)
Recently diagnosed by Dr. Elsworth Soho, but pt appears completely asymptomatic. They are monitoring CT which showed ground-glass infiltrates / mixed airspace disease, with "box pattern" PFT's. Per Dr. Bari Mantis note, they may refer her to ENT or do NECK CT, as her obstructive pattern is ?extrathoracic. Not on any inhaled medications. Pt to f/u in 3 months with pulm, or sooner if needed.

## 2013-08-10 NOTE — Assessment & Plan Note (Signed)
A: Symptoms generally improved and pt does appear much more cheerful and positive in her outlook since her last visit. Denies SI/HI, AH/VH. She thinks Seroquel is helping and increased her dose from 100 mg to 125 mg on her own. Has not seen a therapist but did see a psychologist as part of her disability application; supposed to receive paperwork from this person but haven't yet.  P: Increased Seroquel to 200 mg, new Rx sent today; pt has a letter stating that this may start to require prior authorization but pharmacy stated they have not run the Rx against her insurance yet. Will complete prior auth if needed. Numbers from pt's letter: [phone], fax [phone] [employer]). Advised pt that weight gain / glucose intolerance is a possible side effect of Seroquel and may affect her weight since she uses her daily weight to help monitor her heart failure. Plan to f/u in a few months or sooner if needed.

## 2013-08-16 ENCOUNTER — Ambulatory Visit (INDEPENDENT_AMBULATORY_CARE_PROVIDER_SITE_OTHER): Payer: 59 | Admitting: Diagnostic Neuroimaging

## 2013-08-16 ENCOUNTER — Encounter: Payer: Self-pay | Admitting: Diagnostic Neuroimaging

## 2013-08-16 VITALS — BP 114/77 | HR 81 | Temp 98.2°F | Ht 64.5 in | Wt 160.0 lb

## 2013-08-16 DIAGNOSIS — H5500 Unspecified nystagmus: Secondary | ICD-10-CM

## 2013-08-16 DIAGNOSIS — R42 Dizziness and giddiness: Secondary | ICD-10-CM

## 2013-08-16 NOTE — Progress Notes (Signed)
[facility]  PATIENT: Miranda [name] DOB: 28-Dec-1965  REFERRING CLINICIAN:  HISTORY FROM: patient REASON FOR VISIT: follow up   HISTORICAL  CHIEF COMPLAINT:  Chief Complaint  Patient presents with  . Follow-up    HISTORY OF PRESENT ILLNESS:   UPDATE 08/16/13: Since last visit, symptoms are stable. Still with "dizziness" that feels like off balance situation, but is improved since last visit. No vertigo or lightheadedness. No vision changes. Again tells me that her eyes have always been misaligned.  PRIOR HPI (05/11/13): 48 year old right-handed female here for evaluation of dizziness. Patient has history of hypertension, new diagnosis of congestive heart failure in March 2014. Ever since her diagnosis patient has felt "dizzy" everyday. She denies any spinning sensation. She has some lightheadedness. When she walks she feels off-balance. No nausea or vomiting. No slurred speech or trouble talking. 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 I asked her she's never been diagnosed with "lazy eye" that she's not sure. Patient was noted to have nystagmus by the cardiologist and therefore referred to me for further evaluation.  Patient denies any unilateral numbness or weakness, facial droop, trouble swallowing or headache.  REVIEW OF SYSTEMS: Full 14 system review of systems performed and notable only for dizziness, headache, joint pain, depression, constipation, shortness of breath, ringing ears, runny nose.   ALLERGIES: Allergies  Allergen Reactions  . Codeine Hives    HOME MEDICATIONS: Outpatient Prescriptions Prior to Visit  Medication Sig Dispense Refill  . acetaminophen (TYLENOL) 325 MG tablet Take 2 tablets (650 mg total) by mouth every 4 (four) hours as needed.      Marland Kitchen aspirin EC 81 MG EC tablet Take 1 tablet (81 mg total) by mouth daily.      Marland Kitchen atorvastatin (LIPITOR) 10 MG tablet Take 1 tablet (10 mg total) by mouth daily at 6 PM.  30 tablet  6   . ferrous gluconate (FERGON) 324 MG tablet Take 1 tablet (324 mg total) by mouth 2 (two) times daily with a meal.  60 tablet  3  . furosemide (LASIX) 40 MG tablet Take 1 tablet (40 mg total) by mouth as needed (Only for weight 141 pounds or greater).  30 tablet  11  . levothyroxine (SYNTHROID, LEVOTHROID) 75 MCG tablet Take 1 tablet (75 mcg total) by mouth daily before breakfast.  30 tablet  2  . lisinopril (PRINIVIL,ZESTRIL) 2.5 MG tablet Take 1 tablet (2.5 mg total) by mouth 2 (two) times daily.  60 tablet  11  . potassium chloride SA (K-DUR,KLOR-CON) 20 MEQ tablet Take 1 tablet (20 mEq total) by mouth 2 (two) times daily.  60 tablet  6  . QUEtiapine (SEROQUEL) 100 MG tablet Start taking 2 tablets (200 mg) every day at bedtime.  60 tablet  2  . carvedilol (COREG) 6.25 MG tablet Take 0.5 tablets (3.125 mg total) by mouth 2 (two) times daily with a meal.  30 tablet  3   Facility-Administered Medications Prior to Visit  Medication Dose Route Frequency Provider Last Rate Last Dose  . medroxyPROGESTERone (DEPO-PROVERA) injection 150 mg  150 mg Intramuscular Q90 days Emily Filbert, MD   150 mg at 03/10/13 1452    PAST MEDICAL HISTORY: Past Medical History  Diagnosis Date  . Hypertension   . At risk for sudden cardiac death 11-20-2012  . CHF - Combined Systolic + Diastolic Dysfunction. EF of 15-20% with Grade II diastolic dysfunction on echo 10/27/12 10/28/2012  . S/P cardiac catheterization, 11-20-12,  normal coronaries with minimal luminal irregularities in RCA system 10/30/2012  . Thyroid disease     hypothyroidism    PAST SURGICAL HISTORY: History reviewed. No pertinent past surgical history.  FAMILY HISTORY: Family History  Problem Relation Age of Onset  . Cancer Paternal Grandfather     Bone  . Hypertension Maternal Grandmother   . Hypertension Mother   . Breast cancer Cousin   . Cancer Maternal Uncle     bone    SOCIAL HISTORY:  History   Social History  . Marital Status:  Single    Spouse Name: N/A    Number of Children: 1  . Years of Education: 12th   Occupational History  . unemployed [employer]   Social History Main Topics  . Smoking status: Former Smoker -- 0.50 packs/day for 5 years    Types: Cigarettes, Cigars    Quit date: 08/04/1978  . Smokeless tobacco: Never Used     Comment: 1 cigar daily-quit 10/24/12    . Alcohol Use: No     Comment: quit 09/19/11  . Drug Use: No     Comment: Previously used marijuana; quit 10/25/12  . Sexual Activity: Not Currently   Other Topics Concern  . Not on file   Social History Narrative   Patient lives at home alone.   Caffeine use: 1/2 soda daily     PHYSICAL EXAM  Filed Vitals:   08/16/13 1159 08/16/13 1203 08/16/13 1204  BP:  128/86 114/77  Pulse:  80 81  Temp: 98.2 F (36.8 C)    TempSrc: Oral    Height: 5' 4.5" (1.638 m)    Weight: 160 lb (72.576 kg)      Not recorded    Body mass index is 27.05 kg/(m^2).  GENERAL EXAM: Patient is in no distress  CARDIOVASCULAR: Regular rate and rhythm, no murmurs, no carotid bruits  NEUROLOGIC: MENTAL STATUS: awake, alert, language fluent, comprehension intact, naming intact;SIG DEPRESSED AFFECT. CRANIAL NERVE: no papilledema on fundoscopic exam, pupils equal and reactive to light, visual fields full to confrontation, EXOTROPIA ON PRIMARY GAZE, SUSTAINED NYSTAGMUS ON LEFT > RIGHT GAZE; MILD NYSTAGMUS ON UP AND DOWN GAZE. DECR ADDUCTION OF RIGHT EYE ON LEFT GAZE. NO SUBJECTIVE DIPLOPIA ON GAZE IN ANY DIRECTION. SACCADIC DYSMETRIA ON LATERAL GAZE. Facial sensation and strength symmetric, uvula midline, shoulder shrug symmetric, tongue midline. MOTOR: normal bulk and tone, full strength in the BUE, BLE SENSORY: normal and symmetric to light touch, pinprick, temperature, vibration COORDINATION: finger-nose-finger, fine finger movements SLOW. REFLEXES: deep tendon reflexes present and symmetric GAIT/STATION: narrow based gait; SLOW, SHORT STEPS.  STOOPED POSTURE. DIFF WITH TANDEM. Romberg is negative   DIAGNOSTIC DATA (LABS, IMAGING, TESTING) - I reviewed patient records, labs, notes, testing and imaging myself where available.  Lab Results  Component Value Date   WBC 4.1 06/02/2013   HGB 11.4* 06/02/2013   HCT 34.3* 06/02/2013   MCV 82.7 06/02/2013   PLT 239 06/02/2013      Component Value Date/Time   NA 137 02/28/2013 1130   K 3.9 02/28/2013 1130   CL 104 02/28/2013 1130   CO2 23 02/28/2013 1130   GLUCOSE 86 02/28/2013 1130   BUN 9 02/28/2013 1130   CREATININE 0.63 02/28/2013 1130   CALCIUM 9.4 02/28/2013 1130   PROT 7.7 10/25/2012 1306   ALBUMIN 2.9* 10/25/2012 1306   AST 34 10/25/2012 1306   ALT 13 10/25/2012 1306   ALKPHOS 72 10/25/2012 1306   BILITOT 0.2* 10/25/2012 1306  GFRNONAA >90 02/28/2013 1130   GFRAA >90 02/28/2013 1130   Lab Results  Component Value Date   CHOL 177 10/26/2012   HDL 41 10/26/2012   LDLCALC 113* 10/26/2012   TRIG 115 10/26/2012   CHOLHDL 4.3 10/26/2012   Lab Results  Component Value Date   HGBA1C 5.6 10/28/2012   Lab Results  Component Value Date   UJWJXBJY78 295 10/25/2012   Lab Results  Component Value Date   TSH 2.265 06/02/2013   I reviewed images myself and agree with interpretation.  10/25/12 CT ANGIO CHEST 1. Cardiomegaly and changes of congestive heart failure with bilateral pleural effusions.  2. Mild bibasilar atelectasis without definite pneumonia.  3. Small pericardial effusion.  4. Mild bilateral axillary adenopathy.  5. No pulmonary emboli.  05/26/13 MRI BRAIN 1. Non-specific, hazy subcortical white matter T2 hyperintensities noted in the centrum semiovale, may represent chronic small vessel ischemic disease.  2. Left lateral, posterior fossa cyst measuring 5.5x3.8x3.5cm (APxtransxSI), with mass effect upon the cerebellum. Most likely represents an arachnoid cyst.  3. No acute findings.  05/26/13 MRA HEAD - normal  05/26/13 MRA NECK - normal    ASSESSMENT AND  PLAN  48 y.o. year old female here with CHF, HTN, with new onset dizziness at the same time as her diagnosis of CHF. Certainly her heart and medical issues could account for some generalized dizziness. Her abnormal neurologic exam (eye movements as above), are likely related to her lifelong strabismus. Interestingly, she does not feel symptomatic on eye movement exam (no subjective oscillopsia or diplopia) which suggests that the eye movements may be more chronic or even congenital.   MRI brain, MRA head and neck do not reveal an alternate etiology. Her posterior fossa arachnoid cyst is likely long standing, and these types of cysts are rarely symptomatic. I do not think her arachnoid cyst is responsible for her symptoms.   Ddx dizziness: cardiogenic, pulmonary, metabolic, medication effect, conversion reaction  Dx nystagmus: likely congential nystagmus from strabismus   PLAN: - offered a referral to neuro-ophthalmology to evaluate the nystagmus further, but patient declined - follow up with PCP and cardiology re: dizziness  Return if symptoms worsen or fail to improve, for return to PCP.   Penni Bombard, MD 01/22/3085, 57:84 PM Certified in Neurology, Neurophysiology and Neuroimaging  [facility] [address], [place], [place] [address] [phone]

## 2013-08-26 ENCOUNTER — Ambulatory Visit (INDEPENDENT_AMBULATORY_CARE_PROVIDER_SITE_OTHER): Payer: 59 | Admitting: *Deleted

## 2013-08-26 VITALS — BP 119/78 | HR 82 | Temp 98.0°F | Ht 64.0 in | Wt 162.6 lb

## 2013-08-26 DIAGNOSIS — N925 Other specified irregular menstruation: Secondary | ICD-10-CM

## 2013-08-26 DIAGNOSIS — N938 Other specified abnormal uterine and vaginal bleeding: Secondary | ICD-10-CM

## 2013-08-26 DIAGNOSIS — N949 Unspecified condition associated with female genital organs and menstrual cycle: Secondary | ICD-10-CM

## 2013-08-26 MED ORDER — MEDROXYPROGESTERONE ACETATE 104 MG/0.65ML ~~LOC~~ SUSP
104.0000 mg | Freq: Once | SUBCUTANEOUS | Status: AC
  Administered 2013-08-26: 104 mg via SUBCUTANEOUS

## 2013-09-14 ENCOUNTER — Ambulatory Visit (HOSPITAL_COMMUNITY)
Admission: RE | Admit: 2013-09-14 | Discharge: 2013-09-14 | Disposition: A | Discharge status: Home / Self Care | Payer: 59 | Source: Ambulatory Visit | Attending: Internal Medicine | Admitting: Internal Medicine

## 2013-09-14 VITALS — BP 130/88 | HR 94 | Wt 163.0 lb

## 2013-09-14 DIAGNOSIS — J988 Other specified respiratory disorders: Secondary | ICD-10-CM | POA: Insufficient documentation

## 2013-09-14 DIAGNOSIS — F3289 Other specified depressive episodes: Secondary | ICD-10-CM

## 2013-09-14 DIAGNOSIS — F32A Depression, unspecified: Secondary | ICD-10-CM

## 2013-09-14 DIAGNOSIS — J4489 Other specified chronic obstructive pulmonary disease: Secondary | ICD-10-CM

## 2013-09-14 DIAGNOSIS — I5042 Chronic combined systolic (congestive) and diastolic (congestive) heart failure: Secondary | ICD-10-CM | POA: Insufficient documentation

## 2013-09-14 DIAGNOSIS — E039 Hypothyroidism, unspecified: Secondary | ICD-10-CM | POA: Insufficient documentation

## 2013-09-14 DIAGNOSIS — F329 Major depressive disorder, single episode, unspecified: Secondary | ICD-10-CM

## 2013-09-14 DIAGNOSIS — J449 Chronic obstructive pulmonary disease, unspecified: Secondary | ICD-10-CM

## 2013-09-14 DIAGNOSIS — D259 Leiomyoma of uterus, unspecified: Secondary | ICD-10-CM | POA: Insufficient documentation

## 2013-09-14 DIAGNOSIS — I10 Essential (primary) hypertension: Secondary | ICD-10-CM | POA: Insufficient documentation

## 2013-09-14 DIAGNOSIS — Z7982 Long term (current) use of aspirin: Secondary | ICD-10-CM | POA: Insufficient documentation

## 2013-09-14 DIAGNOSIS — I509 Heart failure, unspecified: Secondary | ICD-10-CM | POA: Insufficient documentation

## 2013-09-14 DIAGNOSIS — D509 Iron deficiency anemia, unspecified: Secondary | ICD-10-CM | POA: Insufficient documentation

## 2013-09-14 DIAGNOSIS — Z79899 Other long term (current) drug therapy: Secondary | ICD-10-CM | POA: Insufficient documentation

## 2013-09-14 LAB — BASIC METABOLIC PANEL
BUN: 10 mg/dL (ref 6–23)
CO2: 23 mEq/L (ref 19–32)
Calcium: 8.9 mg/dL (ref 8.4–10.5)
Chloride: 102 mEq/L (ref 96–112)
Creatinine, Ser: 0.69 mg/dL (ref 0.50–1.10)
GFR calc Af Amer: >90 mL/min (ref >90)
GFR calc non Af Amer: >90 mL/min (ref >90)
Glucose, Bld: 96 mg/dL (ref 70–99)
Potassium: 4.5 mEq/L (ref 3.7–5.3)
Sodium: 138 mEq/L (ref 137–147)

## 2013-09-14 NOTE — Patient Instructions (Addendum)
Will call with lab results.  F/U 6 weeks with ECHO  Do the following things EVERYDAY: 1) Weigh yourself in the morning before breakfast. Write it down and keep it in a log. 2) Take your medicines as prescribed 3) Eat low salt foods-Limit salt (sodium) to 2000 mg per day.  4) Stay as active as you can everyday 5) Limit all fluids for the day to less than 2 liters

## 2013-09-14 NOTE — Progress Notes (Signed)
Patient ID: Miranda [name], female   DOB: 28-Apr-1966, 48 y.o.   MRN: [id number]  GYN: Dr Kennon Rounds PCP: Dr Venetia Maxon  HPI: Miranda [name] is a 48 y.o. female with history of HTN, NICM with EF 56-21%, chronic systolic and diastolic heart failure and iron deficiency anemia.  She also has a fibroid uterus.    Admitted to [facility] 10/25/12 through 10/10/63 with new systolic heart failure. Pertinent labs included Hgb 6.5, hematocrit 23.9, d-dimer 2.62 BP 182/109 and HR 111. EKG showed LVH but no acute changes.  Anemia felt to be secondary to chronic blood loss from fibroid uterus as well as iron deficiency.  Discharge weight 115 pounds.   RHC/LHC November 26, 2012:  RA 5  RV 35/5 (6)  PA 35/19 (26)  PCWP 17  Fick CO/CI 3.66/2.33  Thermo CO/CI 3.57/2.27  PA O2 57%  FA O2 97%  Angiographically normal coronary arteries with only minimal luminal irregularities in the RCA system   10/26/12 ECHO  EF 15-20%. There was diffuse hypokinesis. Grade 2 diastolic dysfunction, mild MR, moderate-severely dilatated LA.  01/24/13 ECHO EF 35-40%, Life vest removed.  CPX : Unable to complete due to low FEV1 (FEV1 34%, FVC 63%, ratio 53%). Referred to pulmonary. Plan for CT of chest  MRI of head showed probable arachnoid cyst with mass effect on cerebellum.   Follow up: Doing ok. Denies CP, orthopnea, or edema. Has noticed increase in weight. +SOB with going up stairs and bending over. Can walk all the way around the grocery store without stopping. +dizziness with walking, improving. Staying in bed a lot, but reports depression is getting better. Following low salt diet and drinking less than 2L a day. Compliant with medications.   Labs (4/14): K 4.1, creatinine 0.6 Labs (02/28/13) K 3.9 , creatinine 0.63 Pro BNP 70 Labs (03/23/13) TSH 5.554   ROS: All systems negative except as listed in HPI, PMH and Problem List.  Past Medical History  Diagnosis Date  . Hypertension   . At risk for sudden cardiac death 11/26/12  . CHF - Combined  Systolic + Diastolic Dysfunction. EF of 15-20% with Grade II diastolic dysfunction on echo 10/27/12 10/28/2012  . S/P cardiac catheterization, 11-26-2012, normal coronaries with minimal luminal irregularities in RCA system 10/30/2012  . Thyroid disease     hypothyroidism    Current Outpatient Prescriptions  Medication Sig Dispense Refill  . acetaminophen (TYLENOL) 325 MG tablet Take 2 tablets (650 mg total) by mouth every 4 (four) hours as needed.      Marland Kitchen aspirin EC 81 MG EC tablet Take 1 tablet (81 mg total) by mouth daily.      Marland Kitchen atorvastatin (LIPITOR) 10 MG tablet Take 1 tablet (10 mg total) by mouth daily at 6 PM.  30 tablet  6  . carvedilol (COREG) 6.25 MG tablet Take 6.25 mg by mouth 2 (two) times daily with a meal.      . ferrous gluconate (FERGON) 324 MG tablet Take 1 tablet (324 mg total) by mouth 2 (two) times daily with a meal.  60 tablet  3  . furosemide (LASIX) 40 MG tablet Take 1 tablet (40 mg total) by mouth as needed (Only for weight 141 pounds or greater).  30 tablet  11  . levothyroxine (SYNTHROID, LEVOTHROID) 75 MCG tablet Take 1 tablet (75 mcg total) by mouth daily before breakfast.  30 tablet  2  . lisinopril (PRINIVIL,ZESTRIL) 2.5 MG tablet Take 1 tablet (2.5 mg total) by mouth 2 (two) times daily.  60 tablet  11  . potassium chloride SA (K-DUR,KLOR-CON) 20 MEQ tablet Take 1 tablet (20 mEq total) by mouth 2 (two) times daily.  60 tablet  6  . QUEtiapine (SEROQUEL) 100 MG tablet Start taking 2 tablets (200 mg) every day at bedtime.  60 tablet  2   Current Facility-Administered Medications  Medication Dose Route Frequency Provider Last Rate Last Dose  . medroxyPROGESTERone (DEPO-PROVERA) injection 150 mg  150 mg Intramuscular Q90 days Emily Filbert, MD   150 mg at 03/10/13 1452    Filed Vitals:   09/14/13 1303  BP: 130/88  Pulse: 94  Weight: 163 lb (73.936 kg)  SpO2: 98%   PHYSICAL EXAM: General: Chronically ill -appearing ambulates without dyspnea. HEENT: normal Neck:  supple. JVP flat. Carotids 2+ bilaterally; no bruits. No lymphadenopathy or thryomegaly appreciated. Cor: PMI normal. Regular rate & rhythm. No rubs, gallops. 2/6 HSM apex.   Lungs: clear Abdomen: soft, nontender, nondistended. No hepatosplenomegaly. No bruits or masses. Good bowel sounds. Extremities: no cyanosis, clubbing, rash, edema Neuro: alert & orientedx3, cranial nerves grossly intact. +lateral nystagmus. Moves all 4 extremities w/o difficulty. Affect pleasant.   ASSESSMENT & PLAN:  1. Chronic systolic CHF: NICM  EF 44-01% 01/24/13  (closer to 40%) so no ICD placed.  - NYHA II-III symptoms and volume status stable. Difficult to quantify symptoms d/t she reports she does not do much and still sleeps a lot. Battling with depression. Will continue lasix 40 mg daily. Check BMET today. - Continue Coreg 6.25 mg bid. Patient reports dizziness much improved, however does not want to increase any medications. - Lengthy discussion with patient about increasing medications for HF or starting Spironolactone, however she is adamant she will not start anything else or let us titrate medications until she has a repeat ECHO. Was going to schedule ECHO for next week and she said no, not until next visit. - Continue lisinopril 2.5 mg BID. - Reinforced the need and importance of daily weights, a low sodium diet, and fluid restriction (less than 2 L a day). Instructed to call the HF clinic if weight increases more than 3 lbs overnight or 5 lbs in a week.  2. Depression:  Seems rather profound.  Continue to follow with PCP. 3. Obstructive airway disease: Saw Dr. Elsworth Soho who will see back to reassess spirometry. If shows box pattern again he will refer to ENT for evaluation or CT of her neck. Not smoking anymore. Chest CT no evidence of PR and small bilateral effusions.  4. HTN -Controlled on current medications.   Follow up 6 weeks with ECHO Junie Bame B NP-C 09/14/2013

## 2013-09-15 ENCOUNTER — Telehealth (HOSPITAL_COMMUNITY): Payer: Self-pay

## 2013-09-15 NOTE — Telephone Encounter (Signed)
Patient called and made aware of lab results from clinic visit 09/14/2013.

## 2013-10-19 ENCOUNTER — Ambulatory Visit (HOSPITAL_BASED_OUTPATIENT_CLINIC_OR_DEPARTMENT_OTHER)
Admission: RE | Admit: 2013-10-19 | Discharge: 2013-10-19 | Disposition: A | Discharge status: Home / Self Care | Payer: 59 | Source: Ambulatory Visit | Attending: Internal Medicine | Admitting: Internal Medicine

## 2013-10-19 ENCOUNTER — Ambulatory Visit (HOSPITAL_COMMUNITY)
Admission: RE | Admit: 2013-10-19 | Discharge: 2013-10-19 | Disposition: A | Discharge status: Home / Self Care | Payer: 59 | Source: Ambulatory Visit | Attending: Internal Medicine | Admitting: Internal Medicine

## 2013-10-19 VITALS — BP 138/82 | HR 85 | Wt 168.5 lb

## 2013-10-19 DIAGNOSIS — I517 Cardiomegaly: Secondary | ICD-10-CM

## 2013-10-19 DIAGNOSIS — F329 Major depressive disorder, single episode, unspecified: Secondary | ICD-10-CM

## 2013-10-19 DIAGNOSIS — F3289 Other specified depressive episodes: Secondary | ICD-10-CM

## 2013-10-19 DIAGNOSIS — F32A Depression, unspecified: Secondary | ICD-10-CM

## 2013-10-19 DIAGNOSIS — I5042 Chronic combined systolic (congestive) and diastolic (congestive) heart failure: Secondary | ICD-10-CM

## 2013-10-19 DIAGNOSIS — I059 Rheumatic mitral valve disease, unspecified: Secondary | ICD-10-CM | POA: Insufficient documentation

## 2013-10-19 DIAGNOSIS — I509 Heart failure, unspecified: Secondary | ICD-10-CM | POA: Insufficient documentation

## 2013-10-19 NOTE — Progress Notes (Signed)
Patient ID: Miranda [name], female   DOB: [birth year], 48 y.o.   MRN: [id number]  GYN: Dr Kennon Rounds PCP: Dr Venetia Maxon  HPI: Miranda [name] is a 48 y.o. female with history of HTN, NICM with EF 02-58%, chronic systolic and diastolic heart failure and iron deficiency anemia.  She also has a fibroid uterus.    Admitted to [facility] 10/25/12 through 12/28/76 with new systolic heart failure. Pertinent labs included Hgb 6.5, hematocrit 23.9, d-dimer 2.62 BP 182/109 and HR 111. EKG showed LVH but no acute changes.  Anemia felt to be secondary to chronic blood loss from fibroid uterus as well as iron deficiency.  Discharge weight 115 pounds.   RHC/LHC 2012/11/07:  RA 5  RV 35/5 (6)  PA 35/19 (26)  PCWP 17  Fick CO/CI 3.66/2.33  Thermo CO/CI 3.57/2.27  PA O2 57%  FA O2 97%  Angiographically normal coronary arteries with only minimal luminal irregularities in the RCA system   10/26/12 ECHO  EF 15-20%. There was diffuse hypokinesis. Grade 2 diastolic dysfunction, mild MR, moderate-severely dilatated LA.  01/24/13 ECHO EF 35-40%, Life vest removed. 3/15 ECHO EF 50%, diffuse hypokinesis, normal RV size and systolic function.   CPX : Unable to complete due to low FEV1 (FEV1 34%, FVC 63%, ratio 53%). Referred to pulmonary. Plan for CT of chest  MRI of head showed probable arachnoid cyst with mass effect on cerebellum.   Follow up: Overall doing fairly well.  No further lightheadedness.  No dyspnea walking on flat ground.  She gets a little winded after a walk up a flight of steps.  No chest pain.  She has been taking Lasix daily, not prn.     Labs (4/14): K 4.1, creatinine 0.6 Labs (02/28/13) K 3.9 , creatinine 0.63 Pro BNP 70 Labs (03/23/13) TSH 5.554  Labs (2/15): K 4.5, creatinine 0.69  ROS: All systems negative except as listed in HPI, PMH and Problem List.  Past Medical History  Diagnosis Date  . Hypertension   . At risk for sudden cardiac death 11/07/12  . CHF - Combined Systolic + Diastolic Dysfunction. EF of  15-20% with Grade II diastolic dysfunction on echo 10/27/12 10/28/2012  . S/P cardiac catheterization, 2012-11-07, normal coronaries with minimal luminal irregularities in RCA system 10/30/2012  . Thyroid disease     hypothyroidism    Current Outpatient Prescriptions  Medication Sig Dispense Refill  . acetaminophen (TYLENOL) 325 MG tablet Take 2 tablets (650 mg total) by mouth every 4 (four) hours as needed.      Marland Kitchen aspirin EC 81 MG EC tablet Take 1 tablet (81 mg total) by mouth daily.      Marland Kitchen atorvastatin (LIPITOR) 10 MG tablet Take 1 tablet (10 mg total) by mouth daily at 6 PM.  30 tablet  6  . carvedilol (COREG) 6.25 MG tablet Take 6.25 mg by mouth 2 (two) times daily with a meal.      . ferrous gluconate (FERGON) 324 MG tablet Take 1 tablet (324 mg total) by mouth 2 (two) times daily with a meal.  60 tablet  3  . furosemide (LASIX) 40 MG tablet Take 40 mg by mouth daily.      Marland Kitchen levothyroxine (SYNTHROID, LEVOTHROID) 75 MCG tablet Take 1 tablet (75 mcg total) by mouth daily before breakfast.  30 tablet  2  . lisinopril (PRINIVIL,ZESTRIL) 2.5 MG tablet Take 1 tablet (2.5 mg total) by mouth 2 (two) times daily.  60 tablet  11  . potassium chloride SA (  K-DUR,KLOR-CON) 20 MEQ tablet Take 1 tablet (20 mEq total) by mouth 2 (two) times daily.  60 tablet  6  . QUEtiapine (SEROQUEL) 100 MG tablet Start taking 2 tablets (200 mg) every day at bedtime.  60 tablet  2   Current Facility-Administered Medications  Medication Dose Route Frequency Provider Last Rate Last Dose  . medroxyPROGESTERone (DEPO-PROVERA) injection 150 mg  150 mg Intramuscular Q90 days Emily Filbert, MD   150 mg at 03/10/13 1452    Filed Vitals:   10/19/13 0958  BP: 138/82  Pulse: 85  Weight: 168 lb 8 oz (76.431 kg)  SpO2: 98%   PHYSICAL EXAM: General: Chronically ill -appearing ambulates without dyspnea. HEENT: normal Neck: supple. JVP flat. Carotids 2+ bilaterally; no bruits. No lymphadenopathy or thryomegaly appreciated. Cor:  PMI normal. Regular rate & rhythm. No rubs, gallops. 2/6 HSM apex.   Lungs: clear Abdomen: soft, nontender, nondistended. No hepatosplenomegaly. No bruits or masses. Good bowel sounds. Extremities: no cyanosis, clubbing, rash, edema Neuro: alert & orientedx3, cranial nerves grossly intact. +lateral nystagmus. Moves all 4 extremities w/o difficulty. Affect pleasant.   ASSESSMENT & PLAN:  1. Chronic systolic CHF: Nonischemic cardiomyopathy.  I reviewed Miranda [name] echo today, EF is back up to 50%.  NYHA class II symptoms.  She is not volume overloaded.  - Continue current Lasix, Coreg, and lisinopril. - I would like to see Miranda [name] more active, walk 20-30 minutes 5-6 times a weeks.  2. Depression:  Seems rather profound.  Continue to follow with PCP.  Followup in 4 months.   Loralie Champagne 10/19/2013

## 2013-10-19 NOTE — Progress Notes (Signed)
  Echocardiogram 2D Echocardiogram has been performed.  Mauricio Po 10/19/2013, 10:01 AM

## 2013-10-19 NOTE — Patient Instructions (Signed)
We will contact you in 4 months to schedule your next appointment.  

## 2013-11-11 ENCOUNTER — Encounter: Payer: Self-pay | Admitting: Family Medicine

## 2013-11-11 ENCOUNTER — Ambulatory Visit (INDEPENDENT_AMBULATORY_CARE_PROVIDER_SITE_OTHER): Payer: 59 | Admitting: Family Medicine

## 2013-11-11 VITALS — BP 133/89 | HR 73 | Temp 98.1°F | Ht 64.0 in | Wt 168.0 lb

## 2013-11-11 DIAGNOSIS — F3289 Other specified depressive episodes: Secondary | ICD-10-CM

## 2013-11-11 DIAGNOSIS — R42 Dizziness and giddiness: Secondary | ICD-10-CM

## 2013-11-11 DIAGNOSIS — F32A Depression, unspecified: Secondary | ICD-10-CM

## 2013-11-11 DIAGNOSIS — E785 Hyperlipidemia, unspecified: Secondary | ICD-10-CM

## 2013-11-11 DIAGNOSIS — E039 Hypothyroidism, unspecified: Secondary | ICD-10-CM

## 2013-11-11 DIAGNOSIS — F329 Major depressive disorder, single episode, unspecified: Secondary | ICD-10-CM

## 2013-11-11 DIAGNOSIS — I10 Essential (primary) hypertension: Secondary | ICD-10-CM

## 2013-11-11 DIAGNOSIS — I5042 Chronic combined systolic (congestive) and diastolic (congestive) heart failure: Secondary | ICD-10-CM

## 2013-11-11 MED ORDER — LEVOTHYROXINE SODIUM 75 MCG PO TABS: 75.0000 ug | ORAL_TABLET | Freq: Every day | ORAL | Status: DC

## 2013-11-11 MED ORDER — QUETIAPINE FUMARATE 100 MG PO TABS: ORAL_TABLET | ORAL | Status: DC

## 2013-11-11 MED ORDER — TRAZODONE HCL 50 MG PO TABS: 50.0000 mg | ORAL_TABLET | Freq: Every evening | ORAL | Status: DC | PRN

## 2013-11-11 MED ORDER — SERTRALINE HCL 50 MG PO TABS: 50.0000 mg | ORAL_TABLET | Freq: Every day | ORAL | Status: DC

## 2013-11-11 NOTE — Progress Notes (Signed)
   Subjective:    Patient ID: Miranda [name], female    DOB: 05-Feb-1966, 48 y.o.   MRN: [id number]  HPI: Pt presents to clinic for general follow-up. States she feels relatively well, and improved from previous visits in various ways.  Depression - still feels down, occasionally, but less often; reports compliance with Seroquel 200 mg daily - trying to be more active, started back walking and feeling better with more activity - very concerned about weight gain, especially being on Seroquel - feels Seroquel is greatly helping her sleep; missed a dose once and was unable to sleep - interested in trying something different to get away from weight gain, etc  Heart failure - recently saw Dr. Aundra Dubin and feels she's doing very well - last echo EF had improved to 50%; reports compliance with medications - denies frank LE swelling; feels this has been doing better with better adherence to Lasix therapy daily - denies chest pain, SOB, palpitations, cough  Hypothyroidism - reports compliance with Synthroid and requests refill - states she feels this may have helped with her depression, as well - denies skin changes, changes in bowel / bladder habits, etc - does question whether this could also be related to weight  Dizziness / brain cyst - improved dizziness in general and states she has felt unbalanced on her feet much less - following with neurology closely, and states they told her they don't think her cyst will cause many problems  Pt is a former smoker. In addition to the above documentation, pt's PMH, surgical history, FH, and SH all reviewed and updated where appropriate in the EMR. I have also reviewed and updated the pt's allergies and current medications as appropriate.  Review of Systems: As above. Denies SI / HI. Otherwise, full 12-system ROS was reviewed and all negative.      Objective:   Physical Exam BP 133/89  Pulse 73  Temp(Src) 98.1 F (36.7 C) (Oral)  Ht 5\' 4"  (1.626  m)  Wt 168 lb (76.204 kg)  BMI 28.82 kg/m2 Gen: well-appearing adult female in NAD  much less depressed-appearing than previously; weight has increased visibly HEENT: [place]/AT, sclerae/conjunctivae clear, no lid lag, EOMI, PERRLA  Visible nystagmus at rest, unchanged from previous exams (?congenital)  MMM, posterior oropharynx clear, no cervical lymphadenopathy  neck supple with full ROM, no masses appreciated; thyroid not enlarged  Cardio: RRR, previous murmur not as well-appreciated today; no LE edema, distal pulses intact Pulm: CTAB, no wheezes, normal WOB  Abd: soft, nondistended, BS+, no HSM Ext: warm/well-perfused, no cyanosis/clubbing/edema MSK: strength 5/5 in all four extremities, no frank joint deformity/effusion  normal ROM to all four extremities with no point muscle/bony tenderness in spine Neuro/Psych: alert/oriented, sensation grossly intact; normal gait/balance  mood relatively euthymic; affect slightly flat, but improved from prior visits with better eye contact  Some mild psychomotor retardation and soft, slow speech, but also improved     Assessment & Plan:

## 2013-11-11 NOTE — Assessment & Plan Note (Signed)
Last TSH check was WNL, and pt reports compliance with Synthroid 75 mcg / day. Mood generally improved and no other symptoms reported to suggest over- or undertreatment. Refilled current dose today, and will check TSH and adjust dose, if needed.

## 2013-11-11 NOTE — Assessment & Plan Note (Signed)
Generally improved; possibly related to nystagmus that is questionably congenital, +/- known arachnoid cyst with mass effect on cerebellum. Regardless, reportedly improved, and following with neurology regularly; their last note describes that etiology is uncertain, but pt is generally improving. F/u with neuro as needed.

## 2013-11-11 NOTE — Assessment & Plan Note (Signed)
Well controlled with current medications, mostly related to marked cardiomyopathy (see past progress notes, especially from cardiology). Continue ASA, Coreg, Lasix, lisinopril. Monitor regularly at f/u.

## 2013-11-11 NOTE — Assessment & Plan Note (Signed)
A: Generally much improved, though pt does still have some symptoms of depression and a blunted affect, though she remains much more positive in outlook. Denies SI / HI. Feels Seroquel is helping, but does blame marked weight gain over the past several months on Seroquel 200 mg daily (up 30 pounds since June of last year; perhaps some component of general better health as pt was rather emaciated with extremely poor heart function previously, which has improved). Interested in switching away from this to another agent, as well as starting something specifically for insomnia. Not currently able to get into any therapy, but feels that medication alone and generally increased activity has helped "enough."   P: Plan to taper off Seroquel over about 1 week (discussed with Dr. Valentina Lucks briefly), then start Zoloft for depression and trazodone for sleep. Plan to f/u in about 6 weeks or sooner if needed.

## 2013-11-11 NOTE — Assessment & Plan Note (Signed)
Compliant with Lipitor, without side effects. Last lipid panel drawn a year ago; will recheck today and will also check liver function. Continue statin, f/u as needed.

## 2013-11-11 NOTE — Patient Instructions (Signed)
Thank you for coming in, today!  For your medications: Start taking half of your Seroquel for two days. Then take half a pill for 3 days. Then take half a pill every other day for 4 days. After that, start taking Zoloft daily (for depression) and trazodone every night at bedtime (for sleep).  Otherwise, keep taking your other medicines without changes.  I will check some bloodwork today, and call or send you a letter with the results.  Come back to see me in about 1-2 months, or sooner if you need. Please feel free to call with any questions or concerns at any time, at [phone]. --Dr. Venetia Maxon

## 2013-11-11 NOTE — Assessment & Plan Note (Addendum)
Follows regularly with CHF clinic; reports compliance with medications as managed by Dr. Aundra Dubin. Generally doing well with EF much improved since admission this time last year. Continue current medications and f/u with CHF clinic as directed by them. Will check iron / ferritin (pt in pharmacy clinic study) and CMP for kidney function, today. F/u with me as needed.

## 2013-11-12 LAB — COMPREHENSIVE METABOLIC PANEL
ALT: 18 U/L (ref 0–35)
AST: 32 U/L (ref 0–37)
Albumin: 3.2 g/dL — ABNORMAL LOW (ref 3.5–5.2)
Alkaline Phosphatase: 85 U/L (ref 39–117)
BUN: 9 mg/dL (ref 6–23)
CO2: 23 mEq/L (ref 19–32)
Calcium: 8.9 mg/dL (ref 8.4–10.5)
Chloride: 108 mEq/L (ref 96–112)
Creat: 0.64 mg/dL (ref 0.50–1.10)
Glucose, Bld: 89 mg/dL (ref 70–99)
Potassium: 4.3 mEq/L (ref 3.5–5.3)
Sodium: 139 mEq/L (ref 135–145)
Total Bilirubin: 0.2 mg/dL (ref 0.2–1.2)
Total Protein: 7.5 g/dL (ref 6.0–8.3)

## 2013-11-12 LAB — IRON: Iron: 61 ug/dL (ref 42–145)

## 2013-11-12 LAB — LIPID PANEL
Cholesterol: 128 mg/dL (ref 0–200)
HDL: 32 mg/dL — ABNORMAL LOW (ref >39)
LDL Cholesterol: 79 mg/dL (ref 0–99)
Total CHOL/HDL Ratio: 4 Ratio
Triglycerides: 86 mg/dL (ref <150)
VLDL: 17 mg/dL (ref 0–40)

## 2013-11-12 LAB — TSH: TSH: 3.113 u[IU]/mL (ref 0.350–4.500)

## 2013-11-12 LAB — FERRITIN: Ferritin: 92 ng/mL (ref 10–291)

## 2013-11-12 LAB — IBC PANEL
%SAT: 19 % — ABNORMAL LOW (ref 20–55)
TIBC: 320 ug/dL (ref 250–470)
UIBC: 259 ug/dL (ref 125–400)

## 2013-11-14 ENCOUNTER — Encounter: Payer: Self-pay | Admitting: Family Medicine

## 2013-11-25 ENCOUNTER — Ambulatory Visit (INDEPENDENT_AMBULATORY_CARE_PROVIDER_SITE_OTHER): Payer: 59 | Admitting: *Deleted

## 2013-11-25 VITALS — BP 128/83 | HR 78 | Temp 98.2°F | Wt 165.5 lb

## 2013-11-25 DIAGNOSIS — N949 Unspecified condition associated with female genital organs and menstrual cycle: Secondary | ICD-10-CM

## 2013-11-25 DIAGNOSIS — N925 Other specified irregular menstruation: Secondary | ICD-10-CM

## 2013-11-25 DIAGNOSIS — N938 Other specified abnormal uterine and vaginal bleeding: Secondary | ICD-10-CM

## 2013-11-25 MED ORDER — MEDROXYPROGESTERONE ACETATE 104 MG/0.65ML ~~LOC~~ SUSP
104.0000 mg | Freq: Once | SUBCUTANEOUS | Status: AC
  Administered 2013-11-25: 104 mg via SUBCUTANEOUS

## 2013-12-07 ENCOUNTER — Telehealth: Payer: Self-pay | Admitting: Family Medicine

## 2013-12-07 NOTE — Telephone Encounter (Signed)
Pt called because her prescription of Trazadone 50 mg needs to be increased. She said the 1 pill a night work for a while then she started taking 2 pills a night and that worked for a while. Now she is out and would like the MG increased. jw

## 2013-12-07 NOTE — Telephone Encounter (Signed)
Called patient and asked her to please make an OV.  We do not adjust medications without and OV.  Last OV with Dr.Street, he asked pt to f/u in 4-6 wks, sooner if needed.  Miranda [name]

## 2014-01-04 ENCOUNTER — Encounter: Payer: Self-pay | Admitting: Family Medicine

## 2014-01-04 ENCOUNTER — Ambulatory Visit (INDEPENDENT_AMBULATORY_CARE_PROVIDER_SITE_OTHER): Payer: Self-pay | Admitting: Family Medicine

## 2014-01-04 VITALS — BP 132/89 | HR 65 | Temp 98.3°F | Resp 17 | Wt 157.0 lb

## 2014-01-04 DIAGNOSIS — G4700 Insomnia, unspecified: Secondary | ICD-10-CM | POA: Insufficient documentation

## 2014-01-04 DIAGNOSIS — F329 Major depressive disorder, single episode, unspecified: Secondary | ICD-10-CM

## 2014-01-04 DIAGNOSIS — F32A Depression, unspecified: Secondary | ICD-10-CM

## 2014-01-04 DIAGNOSIS — N951 Menopausal and female climacteric states: Secondary | ICD-10-CM

## 2014-01-04 DIAGNOSIS — R232 Flushing: Secondary | ICD-10-CM | POA: Insufficient documentation

## 2014-01-04 DIAGNOSIS — F3289 Other specified depressive episodes: Secondary | ICD-10-CM

## 2014-01-04 MED ORDER — TRAZODONE HCL 150 MG PO TABS: 150.0000 mg | ORAL_TABLET | Freq: Every evening | ORAL | Status: DC | PRN

## 2014-01-04 NOTE — Assessment & Plan Note (Signed)
Very likely related to depression, especially given some improvement with antidepressant medications, though still remains a primary complaint for pt. Plan to increase trazodone from 100 mg (pt self-increased from 50 mg) to 150 mg qHS PRN, with plan to possibly increase again depending on response. Also discussed general sleep hygiene, though in the past pt has reportedly had good sleep hygiene. Plan to f/u in 1-2 months or sooner if needed.

## 2014-01-04 NOTE — Progress Notes (Signed)
   Subjective:    Patient ID: Miranda [name], female    DOB: 04/19/1966, 48 y.o.   MRN: [id number]  HPI: Pt presents to clinic for f/u of depression and poor sleep, and also complains of hot flashes. She is no longer taking Seroquel, and is now taking Zoloft and trazodone. She states she is taking two trazodone at a time, every other day, which has been helping some, but she wakes back up after about 3 hours. Her symptoms are similar to before she started taking the Seroquel last year. She is also taking the Zoloft every other day; overall she feels some better in terms of mood / interest.   Her hot flashes have started since starting her Zoloft. She is unsure how often / regular her periods are, because she is on Depo for abnormal bleeding; she is due for her next appointment for her shot in July. She was unable to get a hysterectomy due to her heart issues. Her hot flashes occur 2-3 times per day, lasting a few seconds to a few minutes. They have been getting worse.  Of note, she recently lost her insurance, and is rationing her medications. She is in the process of getting Medicaid, which should be soon. She sees cardiology regularly for several issues, as above; she denies chest pain, SOB, LE edema, changes in vision. Her headaches are better. She has had no new or changed medicines, otherwise.  Review of Systems: As above.     Objective:   Physical Exam BP 132/89  Pulse 65  Temp(Src) 98.3 F (36.8 C)  Resp 17  Wt 157 lb (71.215 kg) Gen: well-appearing adult female in NAD, but with flat affect Cardio: RRR, no murmur appreciated Pulm: CTAB, no wheezes Abd: soft, nontender, BS+ Ext: warm, well-perfused, no LE edema  Mental Status Examination/Evaluation: Objective:  Appearance: Neat and Well Groomed  Psychomotor Activity:  Decreased  Eye Contact::  fair to poor  Speech:  Slow  Volume:  Decreased  Mood:  Reported improved from previous, but still "down," "disinterested"  Affect:   Blunt and Congruent  Thought Process:  Intact, Linear and Logical  Orientation:  Full (Time, Place, and Person)  Thought Content:  Normal, negative for psychosis, delusion, or hallucination  Suicidal Thoughts:  No  Homicidal Thoughts:  No  Judgement:  Fair  Insight:  Fair      Assessment & Plan:

## 2014-01-04 NOTE — Patient Instructions (Signed)
Thank you for coming in, today!  I think your sleep is at least partially related to your depression. The trazodone and Zoloft should work together to help this.  I will increase your trazodone to 150 mg at bedtime as needed. Start taking the Zoloft every day once you're able. Both of these medicines will work better if you take them consistently.  Let me know if you have any issues with medicines. Come back to see me in 1-2 months. Try to make a full appointment with your OBGYN doctor to talk about the hot flashes, and to see if they might reconsider a hysterectomy, if needed. Please feel free to call with any questions or concerns at any time, at [phone]. --Dr. Venetia Maxon

## 2014-01-04 NOTE — Assessment & Plan Note (Signed)
A: Doing fairly well, though remains with blunted affect and depressed mood, with no SI/HI. Recently changed from Seroquel to Zoloft with trazodone for sleep, which has been helping, though pt is rationing due to insurance concerns. No intolerable side effects, though pt does question whether or not hot flashes are related.  P: Continue Zoloft; strongly encouraged pt to take medication regularly once her Medicaid is completely in place and cost is no longer as large an issue. Plan to f/u in 1-2 months and consider dose increase at that time. See also insomnia and hot flash problem list notes; doubt hot flashes related to medications, and increased trazodone, as well.

## 2014-01-04 NOTE — Assessment & Plan Note (Signed)
Doubt hot flashes are related to medications, though with Depo-Provera on board, also difficult to assess whether this could be related to menopause or not (pt is in the right age range). Unable to tell whether pt's periods have become irregular or are slowing / stopping, as she has had irregular bleeding (which is why she's on Provera) and previously was a poor candidate for hysterectomy surgery due to extensive heart disease and frank CHF. Advised pt to f/u with OBGYN to be evaluated from their standpoint, as well as to potentially reconsider hysterectomy, as pt's cardiac issues and control of same have improved greatly in the last year. F/u otherwise as needed.

## 2014-02-17 ENCOUNTER — Telehealth: Payer: Self-pay | Admitting: Family Medicine

## 2014-02-17 ENCOUNTER — Telehealth (HOSPITAL_COMMUNITY): Payer: Self-pay | Admitting: Vascular Surgery

## 2014-02-17 NOTE — Telephone Encounter (Signed)
Pt called and wanted the red team and Dr. Venetia Maxon to know that she is now using [facility] on [address]. She would also like her medication in 90 day qty because it is so much cheaper. jw

## 2014-02-17 NOTE — Telephone Encounter (Signed)
Pt called pharmacy change to [facility] .Marland Kitchen She would like all medication 90 day that is cheaper for her right now.. Please advise

## 2014-02-20 ENCOUNTER — Other Ambulatory Visit (HOSPITAL_COMMUNITY): Payer: Self-pay | Admitting: Cardiology

## 2014-02-20 DIAGNOSIS — I509 Heart failure, unspecified: Secondary | ICD-10-CM

## 2014-02-20 MED ORDER — CARVEDILOL 6.25 MG PO TABS: 6.2500 mg | ORAL_TABLET | Freq: Two times a day (BID) | ORAL | Status: DC

## 2014-02-20 MED ORDER — ATORVASTATIN CALCIUM 10 MG PO TABS: 10.0000 mg | ORAL_TABLET | Freq: Every day | ORAL | Status: DC

## 2014-02-20 MED ORDER — LISINOPRIL 2.5 MG PO TABS: 2.5000 mg | ORAL_TABLET | Freq: Two times a day (BID) | ORAL | Status: DC

## 2014-02-20 MED ORDER — FUROSEMIDE 40 MG PO TABS: 40.0000 mg | ORAL_TABLET | Freq: Every day | ORAL | Status: DC

## 2014-02-20 MED ORDER — POTASSIUM CHLORIDE CRYS ER 20 MEQ PO TBCR: 20.0000 meq | EXTENDED_RELEASE_TABLET | Freq: Two times a day (BID) | ORAL | Status: DC

## 2014-02-20 NOTE — Telephone Encounter (Signed)
At the request of the patient  rx's sent into [facility] on [address] 90 day supply Detailed message left for pt informing rx sent to pharm requested

## 2014-02-20 NOTE — Telephone Encounter (Signed)
At the request of the pt rx's sent to new pharm- [facility] 90 day supply Detailed message left for pt

## 2014-02-24 ENCOUNTER — Ambulatory Visit: Payer: 59

## 2014-04-04 ENCOUNTER — Other Ambulatory Visit: Payer: Self-pay | Admitting: Family Medicine

## 2014-04-10 IMAGING — CT CT ANGIO CHEST
2 of 7 series · 18 of 36 positions shown · IV contrast (omnipaque)
Comparison: 10/25/2012

CLINICAL DATA: Chest pain, failed pulmonary function test, history
of CHF

EXAM:
CT ANGIOGRAPHY CHEST WITH CONTRAST
TECHNIQUE: Multidetector CT imaging of the chest was performed using the
standard protocol during bolus administration of intravenous
contrast. Multiplanar CT image reconstructions including MIPs were
obtained to evaluate the vascular anatomy.
CONTRAST:  100mL OMNIPAQUE IOHEXOL 350 MG/ML SOLN

[Series 6: pe thins · axial · 0.68mm/px · z∈[+749,+974]mm · 17 of 255 slices shown]
[im 15/255  lung]
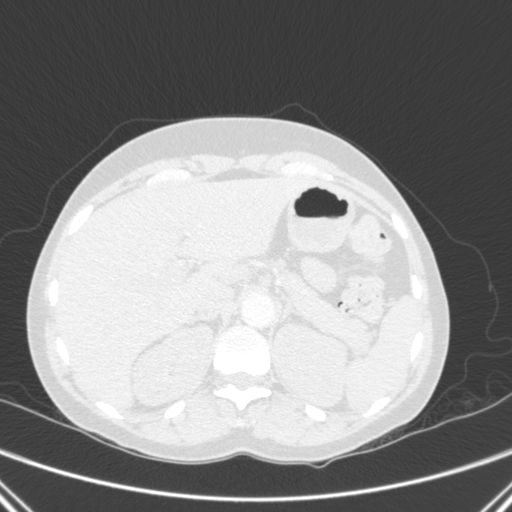
[im 29/255  mediastinal]
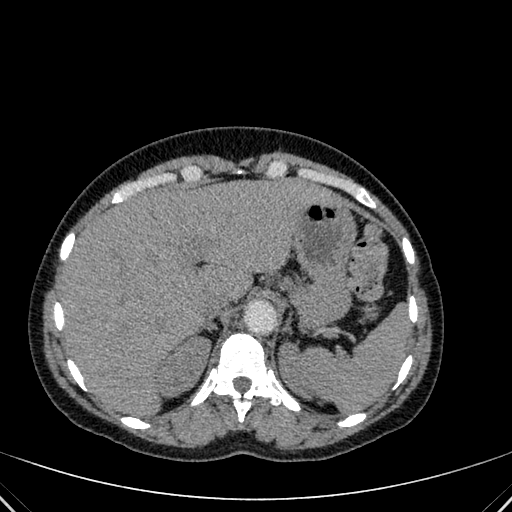
[im 43/255  lung]
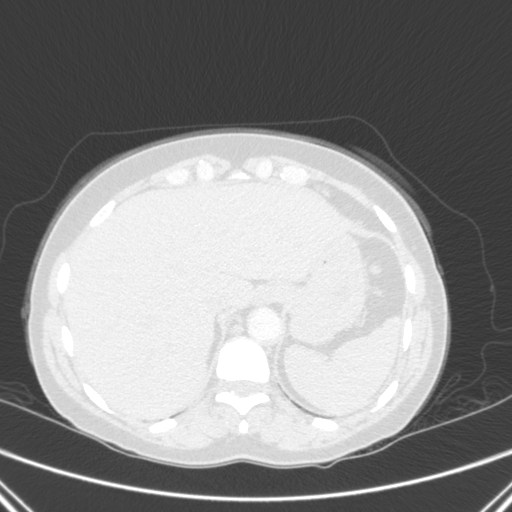
[im 57/255  mediastinal]
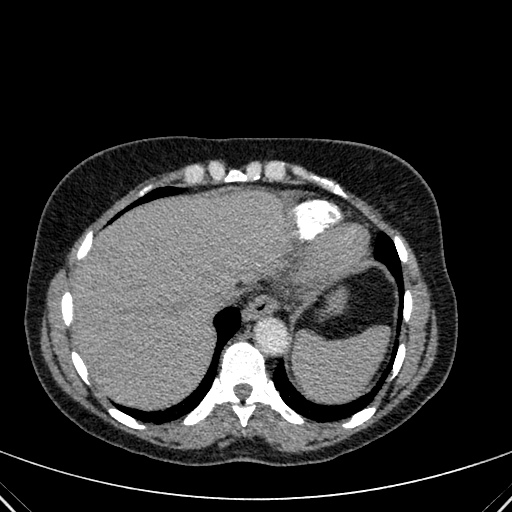
[im 71/255  lung]
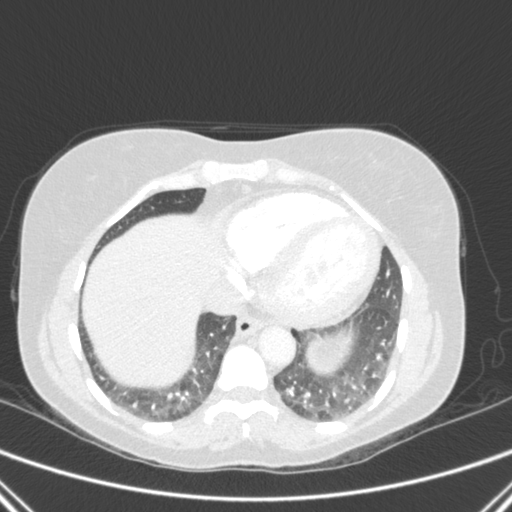
[im 85/255  mediastinal]
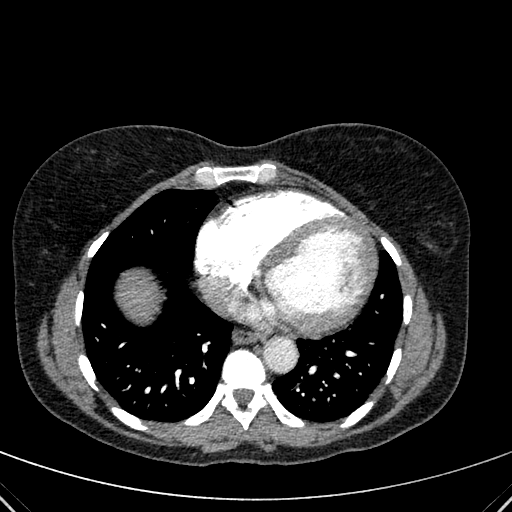
[im 99/255  lung]
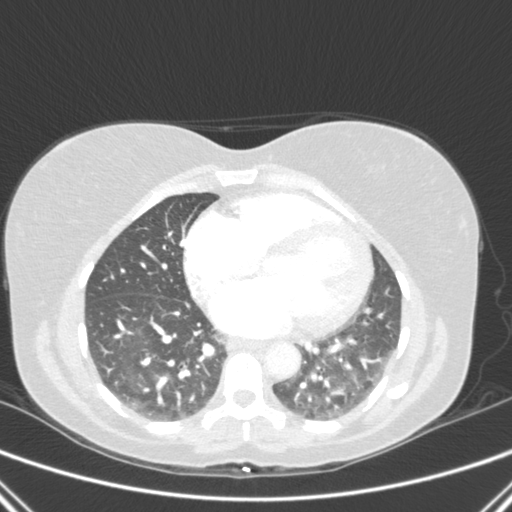
[im 113/255  mediastinal]
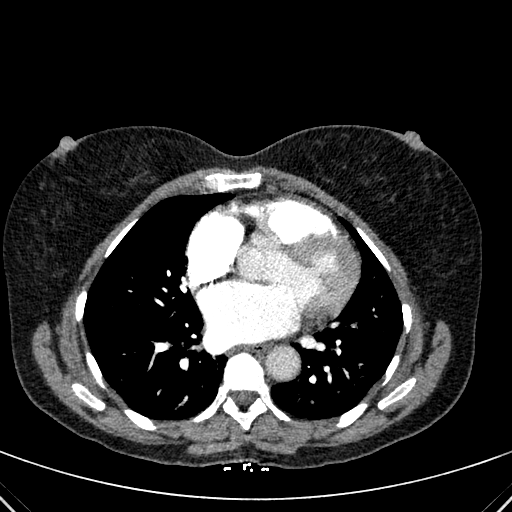
[im 128/255  lung]
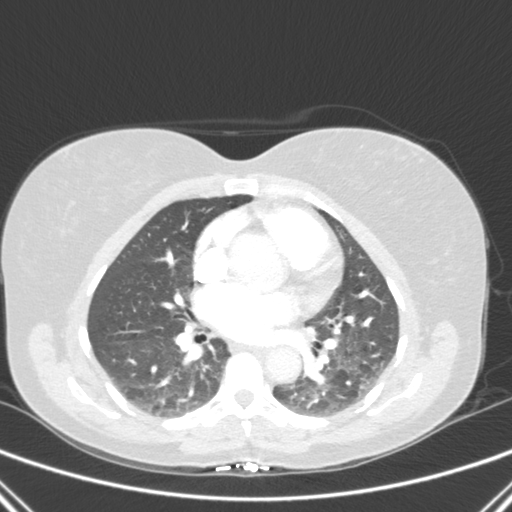
[im 142/255  mediastinal]
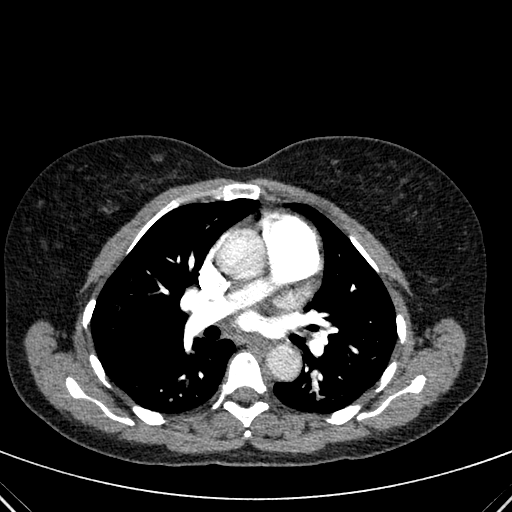
[im 156/255  lung]
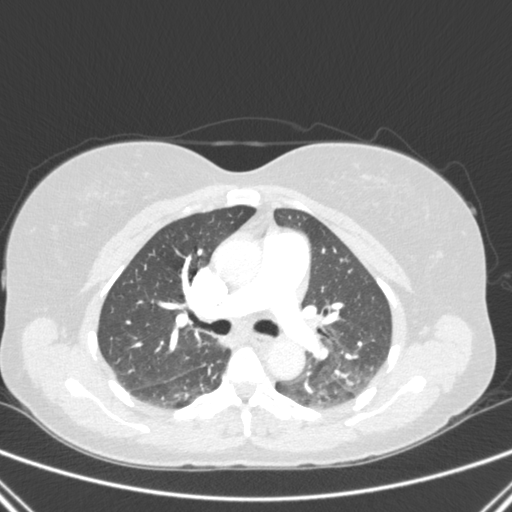
[im 170/255  mediastinal]
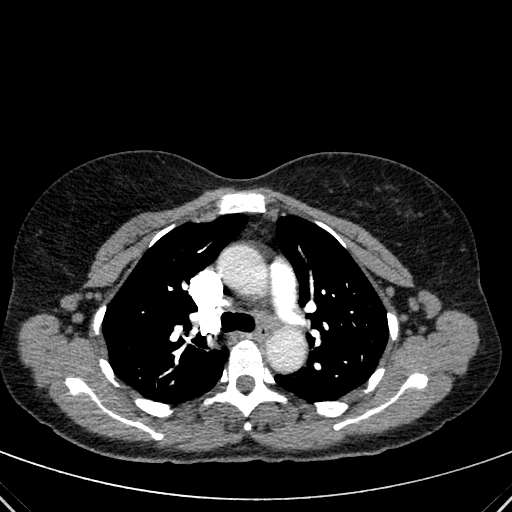
[im 184/255  lung]
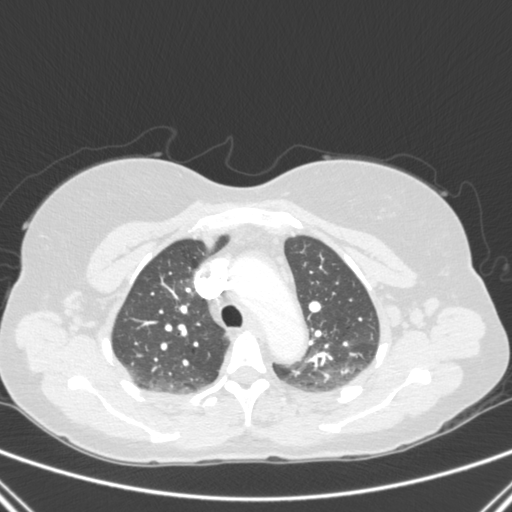
[im 198/255  mediastinal]
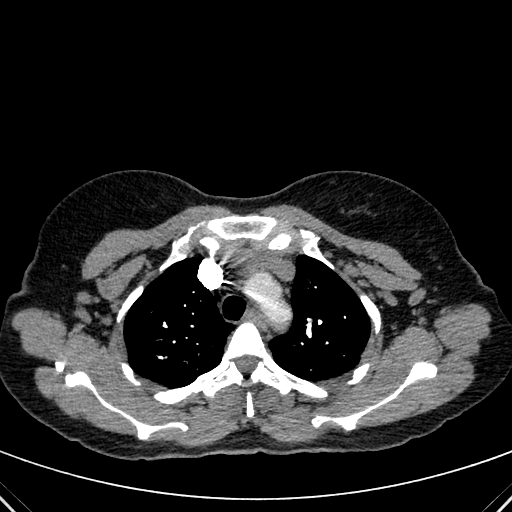
[im 212/255  lung]
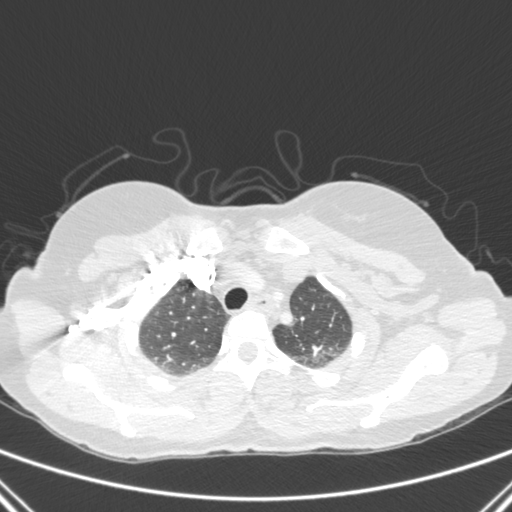
[im 226/255  mediastinal]
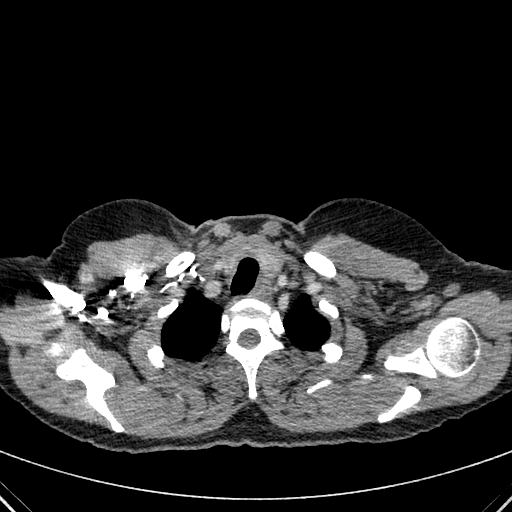
[im 240/255  lung]
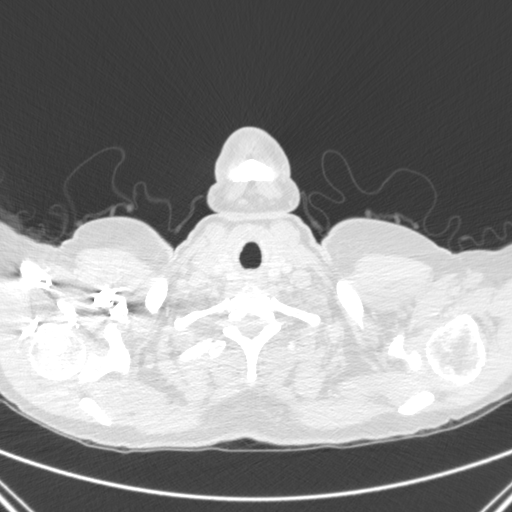

[mpr, coronals, coronal · coronal · 0.68mm/px · 1 of 121 slices shown]
[im 61/121  mediastinal]
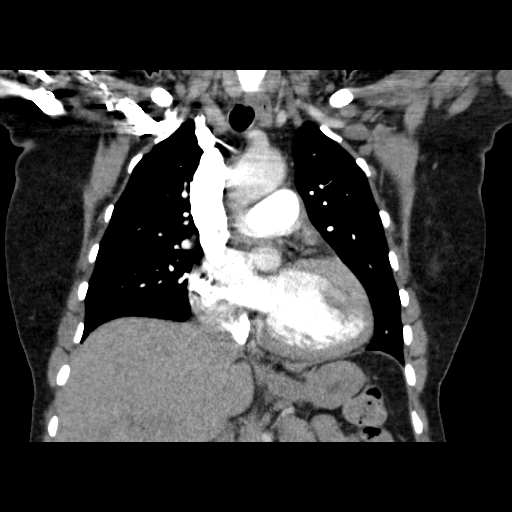

[18 of 36 positions shown; findings below may reference images not displayed]

FINDINGS: There is no CT evidence filling defects within the main, lobar, or
segmental pulmonary arteries. There is no evidence of a thoracic
aortic aneurysm. Multichamber cardiac enlargement is appreciated.
Areas of ground-glass density appreciated within the lung bases with
multifocal areas of ill-defined confluent densities. Linear areas of
increased density projects within the right middle lobe and
posterior bases of the lower lobes. Small bilateral effusions are
appreciated. The visualized abdominal aorta are unremarkable.

Review of the MIP images confirms the above findings.
IMPRESSION: 1. No CT evidence of pulmonary embolus
2. Mixed airspace and interstitial infiltrate within the lung bases
differential considerations are areas of asymmetric pulmonary edema
versus infectious or inflammatory infiltrates
3. Small bilateral effusions

## 2014-06-05 ENCOUNTER — Encounter: Payer: Self-pay | Admitting: Family Medicine

## 2014-07-05 ENCOUNTER — Other Ambulatory Visit: Payer: Self-pay | Admitting: Family Medicine

## 2014-07-13 ENCOUNTER — Encounter (HOSPITAL_COMMUNITY): Payer: Self-pay | Admitting: Cardiology

## 2014-08-07 ENCOUNTER — Other Ambulatory Visit: Payer: Self-pay | Admitting: Family Medicine

## 2014-11-02 ENCOUNTER — Encounter: Payer: Self-pay | Admitting: Internal Medicine

## 2014-11-02 ENCOUNTER — Ambulatory Visit (INDEPENDENT_AMBULATORY_CARE_PROVIDER_SITE_OTHER): Payer: 59 | Admitting: Internal Medicine

## 2014-11-02 VITALS — BP 130/82 | HR 72 | Temp 97.8°F | Resp 18 | Ht 64.0 in | Wt 136.8 lb

## 2014-11-02 DIAGNOSIS — F32A Depression, unspecified: Secondary | ICD-10-CM

## 2014-11-02 DIAGNOSIS — N951 Menopausal and female climacteric states: Secondary | ICD-10-CM

## 2014-11-02 DIAGNOSIS — E039 Hypothyroidism, unspecified: Secondary | ICD-10-CM | POA: Diagnosis not present

## 2014-11-02 DIAGNOSIS — I5042 Chronic combined systolic (congestive) and diastolic (congestive) heart failure: Secondary | ICD-10-CM | POA: Diagnosis not present

## 2014-11-02 DIAGNOSIS — G930 Cerebral cysts: Secondary | ICD-10-CM | POA: Diagnosis not present

## 2014-11-02 DIAGNOSIS — D500 Iron deficiency anemia secondary to blood loss (chronic): Secondary | ICD-10-CM | POA: Diagnosis not present

## 2014-11-02 DIAGNOSIS — J449 Chronic obstructive pulmonary disease, unspecified: Secondary | ICD-10-CM | POA: Diagnosis not present

## 2014-11-02 DIAGNOSIS — D259 Leiomyoma of uterus, unspecified: Secondary | ICD-10-CM

## 2014-11-02 DIAGNOSIS — E785 Hyperlipidemia, unspecified: Secondary | ICD-10-CM | POA: Diagnosis not present

## 2014-11-02 DIAGNOSIS — F329 Major depressive disorder, single episode, unspecified: Secondary | ICD-10-CM

## 2014-11-02 DIAGNOSIS — R232 Flushing: Secondary | ICD-10-CM

## 2014-11-02 MED ORDER — TRAZODONE HCL 150 MG PO TABS: 150.0000 mg | ORAL_TABLET | Freq: Every evening | ORAL | Status: DC | PRN

## 2014-11-02 MED ORDER — VILAZODONE HCL 10 & 20 MG PO KIT: 1.0000 | PACK | Freq: Every day | ORAL | Status: DC

## 2014-11-02 MED ORDER — LEVOTHYROXINE SODIUM 75 MCG PO TABS: ORAL_TABLET | ORAL | Status: DC

## 2014-11-02 NOTE — Progress Notes (Signed)
Patient ID: Miranda [name], female   DOB: 02-Sep-1965, 49 y.o.   MRN: [id number]   Location:  [facility]  Code Status: full code Advanced Directive information Does patient have an advance directive?: No, Would patient like information on creating an advanced directive?: Yes - Educational materials given   Allergies  Allergen Reactions  . Codeine Hives    Chief Complaint  Patient presents with  . Establish Care    New patient establish care  . Immunizations    Refused all    HPI: Patient is a 49 y.o. female seen in the office today to establish with the practice.  Needed a doctor in her network.    Feels so so...has good and bad days.  Sometimes doesn't have any energy to do anything.  Takes trazodone to sleep.  Sometimes only sleeps 4 hrs, then up and can't go back to sleep.  Not mind racing like it was at first.  Mood is [employer].  Was previously on zoloft, but off for about a year.  PCP had her on it.  That's the only one she's been on.  Does note another before the trazodone but it made her gain weight so it was stopped.  Does not have a good appetite.  Looks like seroquel was what made her gain weight.    Says she will move and get swimmy-headed.  Does get lightheaded, but not spinning.  Happens most if she's standing still for a long time or she's reaching up for something and has to catch her breath.  Has never passed out.  Gets ringing in her ears--unsure if one or both.  Seems she doesn't hear as well at the time.  No falls.  Takes her iron supplement for her fibroids.  No bleeding.  When first diagnosed with CHF, she learned she had the fluid in her lungs and so now she is worried and always sleeps on a bunch of pillows.    Review of Systems:  Review of Systems  Constitutional: Positive for weight loss and malaise/fatigue. Negative for fever.       Had been 170 with depression med, went down to 143, then lost more to 130s.      HENT: Positive for tinnitus. Negative for ear pain.        Headaches now and then  Eyes:       Glasses  Respiratory: Positive for shortness of breath. Negative for cough.        Dyspneic on exertion last night when helping mother change sheets; will sit when getting ready in the morning to catch her breath  Cardiovascular: Positive for orthopnea. Negative for chest pain, palpitations, claudication, leg swelling and PND.  Gastrointestinal: Negative for heartburn, diarrhea, constipation, blood in stool and melena.  Genitourinary: Negative for dysuria, urgency and frequency.       Had been on depot shot--hasn't had on in a year...had sweats and does have them still occasionally  Musculoskeletal: Negative for falls.  Skin: Negative for rash.  Neurological: Positive for dizziness, tingling and headaches. Negative for focal weakness, loss of consciousness and weakness.       Rare tingling in fingers  Psychiatric/Behavioral: Positive for depression and hallucinations. Negative for suicidal ideas and memory loss. The patient has insomnia.        Says she hears her phone ringing when it's not; has not recently had any suicidal thoughts, but did at first     Past Medical  History  Diagnosis Date  . Hypertension   . At risk for sudden cardiac death October 30, 2012  . CHF - Combined Systolic + Diastolic Dysfunction. EF of 15-20% with Grade II diastolic dysfunction on echo 10/27/12 10/28/2012  . S/P cardiac catheterization, 2012-10-30, normal coronaries with minimal luminal irregularities in RCA system 10/30/2012  . Thyroid disease     hypothyroidism  . Anemia   . Fibroids     Past Surgical History  Procedure Laterality Date  . Left and right heart catheterization with coronary angiogram N/A 10-30-12    Procedure: LEFT AND RIGHT HEART CATHETERIZATION WITH CORONARY ANGIOGRAM;  Surgeon: Leonie Man, MD;  Location: [facility] CATH LAB;  Service: Cardiovascular;  Laterality: N/A;   Social History:   reports  that she quit smoking about 36 years ago. Her smoking use included Cigars. She has never used smokeless tobacco. She reports that she does not drink alcohol or use illicit drugs.  Family History  Problem Relation Age of Onset  . Cancer Paternal Grandfather     Bone  . Hypertension Maternal Grandmother   . Hypertension Mother   . Autoimmune disease Mother   . Breast cancer Cousin   . Cancer Maternal Uncle     bone  . Stroke Father   . Hypertension Father   . Arthritis Paternal Uncle     Medications: Patient's Medications  New Prescriptions   No medications on file  Previous Medications   ACETAMINOPHEN (TYLENOL) 500 MG TABLET    Take 500 mg by mouth as needed.   ASPIRIN EC 81 MG EC TABLET    Take 1 tablet (81 mg total) by mouth daily.   ATORVASTATIN (LIPITOR) 10 MG TABLET    Take 1 tablet (10 mg total) by mouth daily at 6 PM.   CARVEDILOL (COREG) 6.25 MG TABLET    Take 1 tablet (6.25 mg total) by mouth 2 (two) times daily with a meal.   FERROUS GLUCONATE (FERGON) 324 MG TABLET    Take 1 tablet (324 mg total) by mouth 2 (two) times daily with a meal.   LEVOTHYROXINE (SYNTHROID, LEVOTHROID) 75 MCG TABLET    TAKE ONE TABLET BY MOUTH ONCE DAILY BEFORE BREAKFAST   LISINOPRIL (PRINIVIL,ZESTRIL) 2.5 MG TABLET    Take 1 tablet (2.5 mg total) by mouth 2 (two) times daily.   POTASSIUM CHLORIDE SA (K-DUR,KLOR-CON) 20 MEQ TABLET    Take 1 tablet (20 mEq total) by mouth 2 (two) times daily.   TRAZODONE (DESYREL) 150 MG TABLET    TAKE ONE TABLET BY MOUTH AT BEDTIME AS NEEDED FOR  SLEEP  Modified Medications   No medications on file  Discontinued Medications   FUROSEMIDE (LASIX) 40 MG TABLET    Take 1 tablet (40 mg total) by mouth daily.   SERTRALINE (ZOLOFT) 50 MG TABLET    Take 1 tablet (50 mg total) by mouth daily.     Physical Exam: Filed Vitals:   11/02/14 0901  BP: 130/82  Pulse: 72  Temp: 97.8 F (36.6 C)  TempSrc: Oral  Resp: 18  Height: 5' 4"  (1.626 m)  Weight: 136 lb 12.8 oz  (62.052 kg)  SpO2: 97%  Physical Exam  Constitutional: She is oriented to person, place, and time. She appears well-developed. No distress.  Thin, pale black female  HENT:  Head: Normocephalic and atraumatic.  Right Ear: External ear normal.  Left Ear: External ear normal.  Nose: Nose normal.  Mouth/Throat: Oropharynx is clear and moist. No oropharyngeal exudate.  Eyes: EOM are normal. Pupils are equal, round, and reactive to light.  Has horizontal nystagmus  Neck: Normal range of motion. Neck supple. No JVD present.  Cardiovascular: Normal rate, regular rhythm, normal heart sounds and intact distal pulses.   No murmur heard. Pulmonary/Chest: Effort normal and breath sounds normal. She has no rales.  Abdominal: Bowel sounds are normal.  Musculoskeletal: Normal range of motion.  Neurological: She is alert and oriented to person, place, and time. She has normal reflexes. She exhibits normal muscle tone.  Skin: Skin is warm and dry.  Psychiatric:  Affect is flat, she is tearful at times when discussing chf    Labs reviewed: Basic Metabolic Panel:  Recent Labs  11/11/13 0916  NA 139  K 4.3  CL 108  CO2 23  GLUCOSE 89  BUN 9  CREATININE 0.64  CALCIUM 8.9  TSH 3.113   Liver Function Tests:  Recent Labs  11/11/13 0916  AST 32  ALT 18  ALKPHOS 85  BILITOT 0.2  PROT 7.5  ALBUMIN 3.2*   No results for input(s): LIPASE, AMYLASE in the last 8760 hours. No results for input(s): AMMONIA in the last 8760 hours. CBC: No results for input(s): WBC, NEUTROABS, HGB, HCT, MCV, PLT in the last 8760 hours. Lipid Panel:  Recent Labs  11/11/13 0916  CHOL 128  HDL 32*  LDLCALC 79  TRIG 86  CHOLHDL 4.0   Lab Results  Component Value Date   HGBA1C 5.6 10/28/2012    Assessment/Plan 1. CHF - Combined Systolic + Diastolic Dysfunction - advised to return to Dr. Haroldine Laws for her continued chf mgt - Comprehensive metabolic panel -appears her bp could tolerate  entresto--would consider at future visit considering her young age and high risk of sudden cardiac death and rehospitalization  2. Chronic obstructive pulmonary disease, unspecified COPD, unspecified chronic bronchitis type - no longer smokes cigars -she is not on any meds for this -I don't see any record of PFTs being done in the past and she has no related complaints - Comprehensive metabolic panel  3. Hypothyroidism, unspecified hypothyroidism type -has been stable on same dose of synthroid, but has also been losing weight - check TSH  4. Cyst of brain -saw neurology once and had MRI scan that showed Abnormal MRI brain (without contrast) demonstrating: 1. Non-specific, hazy subcortical white matter T2 hyperintensities noted in the centrum semiovale, may represent chronic small vessel ischemic disease. 2. Left lateral, posterior fossa cyst measuring 5.5x3.8x3.5cm (APxtransxSI), with mass effect upon the cerebellum. Most likely represents an arachnoid cyst. 3. No acute findings. -has nystagmus which she says has been longstanding and h/o lazy eye -reviewed Dr. Nikki Dom notes which indicate he feels her dizziness episodes are not related to the cyst -I am suspicious that they could be since there was said mass effect on cerebellum--I am inclined to repeat the test or have her see the neurophthalmologist as he suggested (she was not overwhelmingly enthused about seeing another specialist and previously refused)  5. Uterine leiomyoma, unspecified location -says she does not have any bleeding and has stopped depo shots--seems to now be going through menopause -f/u cbc and iron panel -cont iron supplement  6. Hot flashes -discussed that we could try temporary low dose gabapentin for her hot flashes--she was hoping for an otc treatment -I'm not aware of any that are effective and safe  7. Hyperlipidemia - cont lipitor 60m and f/u liver and hba1c - Comprehensive metabolic panel -  Hemoglobin A1c - Lipid  panel  8. Depression - this seems to be dominating her problems at this time -she is even having some auditory hallucinations--? Schizoaffective disorder - Vilazodone HCl (VIIBRYD STARTER PACK) 10 & 20 MG KIT; Take 1 tablet by mouth daily. Was given to her with instructions to call in 10 days to let me know how she is tolerating it -if helping, will call in Rx for the 81m or even 459m -would consider adding abilify or new antipsychotic to her regimen if mood still not optimized  9. Iron deficiency anemia due to chronic blood loss - cont iron therapy - CBC with Differential/Platelet - Iron and TIBC - Ferritin  Labs/tests ordered:   Orders Placed This Encounter  Procedures  . HM MAMMOGRAPHY    This external order was created through the Results Console.  . Marland KitchenBC with Differential/Platelet  . Comprehensive metabolic panel    Order Specific Question:  Has the patient fasted?    Answer:  Yes  . Hemoglobin A1c  . Lipid panel    Order Specific Question:  Has the patient fasted?    Answer:  Yes  . TSH  . Iron and TIBC  . Ferritin    Next appt:  6 wks  Cotton Beckley L. Rayfield Beem, D.O. [facility] [address] [place] [address] Phone (Mon-Fri 8am-5pm):  [phone] Call:  [phone] follow prompts after 5pm & weekends Office Phone:  [phone] Fax:  [phone]

## 2014-11-03 LAB — IRON AND TIBC
Iron Saturation: 18 % (ref 15–55)
Iron: 51 ug/dL (ref 27–159)
Total Iron Binding Capacity: 286 ug/dL (ref 250–450)
UIBC: 235 ug/dL (ref 131–425)

## 2014-11-03 LAB — COMPREHENSIVE METABOLIC PANEL
ALT: 7 IU/L (ref 0–32)
AST: 19 IU/L (ref 0–40)
Albumin/Globulin Ratio: 0.9 — ABNORMAL LOW (ref 1.1–2.5)
Albumin: 3.7 g/dL (ref 3.5–5.5)
Alkaline Phosphatase: 66 IU/L (ref 39–117)
BUN/Creatinine Ratio: 11 (ref 9–23)
BUN: 7 mg/dL (ref 6–24)
Bilirubin Total: <0.2 mg/dL (ref 0.0–1.2)
CO2: 23 mmol/L (ref 18–29)
Calcium: 9.3 mg/dL (ref 8.7–10.2)
Chloride: 105 mmol/L (ref 97–108)
Creatinine, Ser: 0.64 mg/dL (ref 0.57–1.00)
GFR calc Af Amer: 120 mL/min/{1.73_m2} (ref >59)
GFR calc non Af Amer: 104 mL/min/{1.73_m2} (ref >59)
Globulin, Total: 4 g/dL (ref 1.5–4.5)
Glucose: 94 mg/dL (ref 65–99)
Potassium: 4.2 mmol/L (ref 3.5–5.2)
Sodium: 140 mmol/L (ref 134–144)
Total Protein: 7.7 g/dL (ref 6.0–8.5)

## 2014-11-03 LAB — CBC WITH DIFFERENTIAL/PLATELET
Basophils Absolute: 0 10*3/uL (ref 0.0–0.2)
Basos: 0 %
Eos: 4 %
Eosinophils Absolute: 0.2 10*3/uL (ref 0.0–0.4)
HCT: 37.1 % (ref 34.0–46.6)
Hemoglobin: 12 g/dL (ref 11.1–15.9)
Immature Grans (Abs): 0 10*3/uL (ref 0.0–0.1)
Immature Granulocytes: 0 %
Lymphocytes Absolute: 1.1 10*3/uL (ref 0.7–3.1)
Lymphs: 24 %
MCH: 28.2 pg (ref 26.6–33.0)
MCHC: 32.3 g/dL (ref 31.5–35.7)
MCV: 87 fL (ref 79–97)
Monocytes Absolute: 0.5 10*3/uL (ref 0.1–0.9)
Monocytes: 10 %
Neutrophils Absolute: 3 10*3/uL (ref 1.4–7.0)
Neutrophils Relative %: 62 %
Platelets: 241 10*3/uL (ref 150–379)
RBC: 4.26 x10E6/uL (ref 3.77–5.28)
RDW: 15.4 % (ref 12.3–15.4)
WBC: 4.8 10*3/uL (ref 3.4–10.8)

## 2014-11-03 LAB — LIPID PANEL
Chol/HDL Ratio: 3.3 ratio units (ref 0.0–4.4)
Cholesterol, Total: 171 mg/dL (ref 100–199)
HDL: 52 mg/dL (ref >39)
LDL Calculated: 101 mg/dL — ABNORMAL HIGH (ref 0–99)
Triglycerides: 90 mg/dL (ref 0–149)
VLDL Cholesterol Cal: 18 mg/dL (ref 5–40)

## 2014-11-03 LAB — TSH: TSH: 10.61 u[IU]/mL — ABNORMAL HIGH (ref 0.450–4.500)

## 2014-11-03 LAB — FERRITIN: Ferritin: 86 ng/mL (ref 15–150)

## 2014-11-03 LAB — HEMOGLOBIN A1C
Est. average glucose Bld gHb Est-mCnc: 117 mg/dL
Hgb A1c MFr Bld: 5.7 % — ABNORMAL HIGH (ref 4.8–5.6)

## 2014-11-04 LAB — T4, FREE: Free T4: 1.13 ng/dL (ref 0.82–1.77)

## 2014-11-04 LAB — SPECIMEN STATUS REPORT

## 2014-11-08 ENCOUNTER — Telehealth (HOSPITAL_COMMUNITY): Payer: Self-pay | Admitting: Vascular Surgery

## 2014-11-13 ENCOUNTER — Telehealth: Payer: Self-pay | Admitting: *Deleted

## 2014-11-13 MED ORDER — VILAZODONE HCL 20 MG PO TABS: ORAL_TABLET | ORAL | Status: DC

## 2014-11-13 NOTE — Telephone Encounter (Signed)
Patient called and stated that you told her to call you back and let her know if the Viibryd samples worked or not. They are Working.  Patient also wanted to make sure a referral was placed for Dr. Haroldine Laws. An Order was placed and patient Notified.

## 2014-11-13 NOTE — Telephone Encounter (Signed)
Great, glad to hear the viibryd helped.  Let's send in a Rx for viibryd 20mg  daily to her pharmacy.    Appt is 4/12 with Dr. Marlou Porch at [facility].

## 2014-11-13 NOTE — Telephone Encounter (Signed)
Patient Notified and Rx Faxed to pharmacy 

## 2014-11-14 ENCOUNTER — Institutional Professional Consult (permissible substitution): Payer: Self-pay | Admitting: Cardiology

## 2014-11-15 ENCOUNTER — Telehealth: Payer: Self-pay

## 2014-11-15 ENCOUNTER — Other Ambulatory Visit: Payer: Self-pay | Admitting: Internal Medicine

## 2014-11-15 MED ORDER — BUPROPION HCL ER (XL) 150 MG PO TB24: 150.0000 mg | ORAL_TABLET | Freq: Every day | ORAL | Status: DC

## 2014-11-15 NOTE — Telephone Encounter (Signed)
Patient never tried Wellbutrin. Patient would like RX sent to [facility] on [address]

## 2014-11-15 NOTE — Telephone Encounter (Signed)
Noted, patient aware

## 2014-11-15 NOTE — Telephone Encounter (Signed)
Message left on triage voicemail, Miranda [name] is too expensive. Patient is requesting a less expensive alternative.   Please advise

## 2014-11-15 NOTE — Telephone Encounter (Signed)
I discontinued the viibryd and sent in a prescription for wellbutrin XL 150mg  po daily for depression to [facility] on [address]

## 2014-11-15 NOTE — Telephone Encounter (Signed)
I had a bad feeling that might happen.  Has she ever taken wellbutrin in the past?  It might be another alternative for her.

## 2014-11-22 ENCOUNTER — Encounter: Payer: Self-pay | Admitting: Cardiology

## 2014-12-14 ENCOUNTER — Ambulatory Visit (INDEPENDENT_AMBULATORY_CARE_PROVIDER_SITE_OTHER): Payer: 59 | Admitting: Internal Medicine

## 2014-12-14 ENCOUNTER — Encounter: Payer: Self-pay | Admitting: Internal Medicine

## 2014-12-14 VITALS — BP 110/70 | HR 65 | Temp 97.9°F | Ht 64.0 in | Wt 133.6 lb

## 2014-12-14 DIAGNOSIS — J449 Chronic obstructive pulmonary disease, unspecified: Secondary | ICD-10-CM

## 2014-12-14 DIAGNOSIS — F329 Major depressive disorder, single episode, unspecified: Secondary | ICD-10-CM

## 2014-12-14 DIAGNOSIS — I5042 Chronic combined systolic (congestive) and diastolic (congestive) heart failure: Secondary | ICD-10-CM | POA: Diagnosis not present

## 2014-12-14 DIAGNOSIS — D259 Leiomyoma of uterus, unspecified: Secondary | ICD-10-CM

## 2014-12-14 DIAGNOSIS — E039 Hypothyroidism, unspecified: Secondary | ICD-10-CM | POA: Diagnosis not present

## 2014-12-14 DIAGNOSIS — D500 Iron deficiency anemia secondary to blood loss (chronic): Secondary | ICD-10-CM

## 2014-12-14 DIAGNOSIS — Z23 Encounter for immunization: Secondary | ICD-10-CM

## 2014-12-14 DIAGNOSIS — F32A Depression, unspecified: Secondary | ICD-10-CM

## 2014-12-14 NOTE — Progress Notes (Signed)
Patient ID: Miranda [name], female   DOB: 13-Dec-1965, 49 y.o.   MRN: [id number]   Location:  [facility]  Code Status: full code Goals of Care: Advanced Directive information Does patient have an advance directive?: No, Would patient like information on creating an advanced directive?: Yes - Educational materials given   Allergies  Allergen Reactions  . Codeine Hives    Chief Complaint  Patient presents with  . Medical Management of Chronic Issues    6 week follow-up ,  labs completed 10/2014    HPI: Patient is a 49 y.o. female seen in the office today for med mgt of chronic diseases--primarily f/u on depression, thyroid and chf.    Feels like wellbutrin has helped her mood some.  Does have more energy.  Still does not sleep well each night--intermittent.   No hallucinations.  Appetite is not great with wellbutrin.  It started coming back with the viibryd.    Is waiting on insurance to approve visit to Dr. Marlou Porch re: Duanne Limerick.    Has her shortness of breath--sometimes just reaching up over her head will do it.  No PND.  Does sleep on at least 2 pillows at night.    Has not had a cycle since December.  No pain from fibroids at this point.  Is menopausal it seems.    Review of Systems:  Review of Systems  Constitutional: Positive for weight loss and malaise/fatigue. Negative for fever and chills.  HENT: Negative for congestion and hearing loss.   Eyes:       Wears glasses  Respiratory: Positive for shortness of breath. Negative for cough and sputum production.   Cardiovascular: Negative for chest pain.  Gastrointestinal: Negative for abdominal pain, blood in stool and melena.  Genitourinary: Negative for dysuria.       No vaginal bleeding  Musculoskeletal: Negative for falls.  Skin: Negative for rash.  Neurological: Positive for weakness. Negative for dizziness, loss of consciousness and headaches.  Psychiatric/Behavioral: Positive for  depression and memory loss. Negative for hallucinations. The patient has insomnia.     Past Medical History  Diagnosis Date  . Hypertension   . At risk for sudden cardiac death 2012-11-22  . CHF - Combined Systolic + Diastolic Dysfunction. EF of 15-20% with Grade II diastolic dysfunction on echo 10/27/12 10/28/2012  . S/P cardiac catheterization, 2012-11-22, normal coronaries with minimal luminal irregularities in RCA system 10/30/2012  . Thyroid disease     hypothyroidism  . Anemia   . Fibroids     Past Surgical History  Procedure Laterality Date  . Left and right heart catheterization with coronary angiogram N/A Nov 22, 2012    Procedure: LEFT AND RIGHT HEART CATHETERIZATION WITH CORONARY ANGIOGRAM;  Surgeon: Leonie Man, MD;  Location: [facility] CATH LAB;  Service: Cardiovascular;  Laterality: N/A;    Social History:   reports that she quit smoking about 36 years ago. Her smoking use included Cigars. She has never used smokeless tobacco. She reports that she does not drink alcohol or use illicit drugs.  Family History  Problem Relation Age of Onset  . Cancer Paternal Grandfather     Bone  . Hypertension Maternal Grandmother   . Hypertension Mother   . Autoimmune disease Mother   . Breast cancer Cousin   . Cancer Maternal Uncle     bone  . Stroke Father   . Hypertension Father   . Arthritis Paternal Uncle     Medications: Patient's  Medications  New Prescriptions   No medications on file  Previous Medications   ACETAMINOPHEN (TYLENOL) 500 MG TABLET    Take 500 mg by mouth as needed.   ASPIRIN EC 81 MG EC TABLET    Take 1 tablet (81 mg total) by mouth daily.   ATORVASTATIN (LIPITOR) 10 MG TABLET    Take 1 tablet (10 mg total) by mouth daily at 6 PM.   BUPROPION (WELLBUTRIN XL) 150 MG 24 HR TABLET    Take 1 tablet (150 mg total) by mouth daily.   CARVEDILOL (COREG) 6.25 MG TABLET    Take 1 tablet (6.25 mg total) by mouth 2 (two) times daily with a meal.   FERROUS GLUCONATE (FERGON)  324 MG TABLET    Take 1 tablet (324 mg total) by mouth 2 (two) times daily with a meal.   LEVOTHYROXINE (SYNTHROID, LEVOTHROID) 75 MCG TABLET    TAKE ONE TABLET BY MOUTH ONCE DAILY BEFORE BREAKFAST   LISINOPRIL (PRINIVIL,ZESTRIL) 2.5 MG TABLET    Take 1 tablet (2.5 mg total) by mouth 2 (two) times daily.   POTASSIUM CHLORIDE SA (K-DUR,KLOR-CON) 20 MEQ TABLET    Take 1 tablet (20 mEq total) by mouth 2 (two) times daily.   TRAZODONE (DESYREL) 150 MG TABLET    Take 1 tablet (150 mg total) by mouth at bedtime as needed for sleep.  Modified Medications   No medications on file  Discontinued Medications   No medications on file     Physical Exam: Filed Vitals:   12/14/14 0806  BP: 110/70  Pulse: 65  Temp: 97.9 F (36.6 C)  TempSrc: Oral  Height: 5\' 4"  (1.626 m)  Weight: 133 lb 9.6 oz (60.601 kg)  SpO2: 98%  Physical Exam  Constitutional: She is oriented to person, place, and time.  Thin female  Eyes:  glasses  Cardiovascular: Normal rate, regular rhythm, normal heart sounds and intact distal pulses.   Pulmonary/Chest: Effort normal and breath sounds normal. She has no wheezes.  Abdominal: Bowel sounds are normal.  Neurological: She is alert and oriented to person, place, and time.  Skin: Skin is warm and dry.  Psychiatric:  Flat affect    Labs reviewed: Basic Metabolic Panel:  Recent Labs  11/02/14 1036  NA 140  K 4.2  CL 105  CO2 23  GLUCOSE 94  BUN 7  CREATININE 0.64  CALCIUM 9.3  TSH 10.610*   Liver Function Tests:  Recent Labs  11/02/14 1036  AST 19  ALT 7  ALKPHOS 66  BILITOT <0.2  PROT 7.7   No results for input(s): LIPASE, AMYLASE in the last 8760 hours. No results for input(s): AMMONIA in the last 8760 hours. CBC:  Recent Labs  11/02/14 1036  WBC 4.8  NEUTROABS 3.0  HGB 12.0  HCT 37.1  MCV 87  PLT 241   Lipid Panel:  Recent Labs  11/02/14 1036  CHOL 171  HDL 52  LDLCALC 101*  TRIG 90  CHOLHDL 3.3   Lab Results  Component  Value Date   HGBA1C 5.7* 11/02/2014    Assessment/Plan 1. CHF - Combined Systolic + Diastolic Dysfunction -still has not been cleared by insurance to see Dr. Marlou Porch re: her CHF -latest echo 10/19/13 showed improvement to 50% from 15%  2. Depression -did great with viibryd, but was not covered by insurance and very expensive -tried wellbutrin which she feels has helped some, but appetite remains quite poor with it -I asked her to bring her  formulary so I can select something on it--she is continuing to lose weight and it seems mood related due to the improvement with viibryd -cont coreg, lisinopril -with improved ef, no longer appropriate for entresto and certainly her insurance would not cover that  3. Hypothyroidism, unspecified hypothyroidism type -cont current synthroid and recheck tsh today - TSH  4. Chronic obstructive pulmonary disease, unspecified COPD, unspecified chronic bronchitis type -not on any treatments for this at present  5. Iron deficiency anemia due to chronic blood loss -cont iron daily with breakfast -fortunately not bleeding anymore  6. Uterine leiomyoma, unspecified location -fibroids were cause of her bleeding, has stopped (is also right at prime menopausal age)  28. Need for vaccination with 13-polyvalent pneumococcal conjugate vaccine - Pneumococcal conjugate vaccine 13-valent--prevnar given due to her CHF, COPD  Labs/tests ordered:   Orders Placed This Encounter  Procedures  . Pneumococcal conjugate vaccine 13-valent  . TSH    Next appt:   1 month re: depression  Urian Martenson L. Katniss Weedman, D.O. [facility] [address], [place] [address] Cell Phone (Mon-Fri 8am-5pm):  [phone] On Call:  [phone] & follow prompts after 5pm & weekends Office Phone:  [phone] Office Fax:  [phone]

## 2014-12-14 NOTE — Patient Instructions (Signed)
Please bring me a copy of your formulary to review which meds will be covered for your depression.

## 2014-12-15 ENCOUNTER — Telehealth: Payer: Self-pay

## 2014-12-15 LAB — TSH: TSH: 7.02 u[IU]/mL — ABNORMAL HIGH (ref 0.450–4.500)

## 2014-12-15 MED ORDER — LEVOTHYROXINE SODIUM 88 MCG PO TABS: 88.0000 ug | ORAL_TABLET | Freq: Every day | ORAL | Status: DC

## 2014-12-15 NOTE — Telephone Encounter (Signed)
-----   Message from Gayland Curry, DO sent at 12/15/2014  8:28 AM EDT ----- Increase synthroid to 57mcg each morning on an empty stomach at least 30 mins before her breakfast and other pills.  Improving.  Recheck tsh in 6 wks.  Correction of this might also help her mood.

## 2014-12-15 NOTE — Telephone Encounter (Signed)
Discussed with patient, patient verbalized understanding of results. RX sent to pharmacy. Patient requested mailed copy of report.

## 2014-12-21 ENCOUNTER — Telehealth: Payer: Self-pay

## 2014-12-21 NOTE — Telephone Encounter (Signed)
Spoke with patient, patient aware we need formulary for medications. Patient will call insurance company. We will hold other booklet until pending appointment

## 2014-12-21 NOTE — Telephone Encounter (Signed)
-----   Message from Gayland Curry, DO sent at 12/21/2014  1:07 PM EDT ----- Please call patient and ask her to contact her insurance company ([employer]) for a copy of her medication formulary.  The book she brought in does not include the medications they will pay for.

## 2015-01-11 NOTE — Telephone Encounter (Signed)
Open in error

## 2015-01-15 ENCOUNTER — Ambulatory Visit (INDEPENDENT_AMBULATORY_CARE_PROVIDER_SITE_OTHER): Payer: 59 | Admitting: Internal Medicine

## 2015-01-15 ENCOUNTER — Encounter: Payer: Self-pay | Admitting: Internal Medicine

## 2015-01-15 VITALS — BP 138/76 | HR 73 | Temp 97.7°F | Resp 18 | Ht 64.0 in | Wt 129.2 lb

## 2015-01-15 DIAGNOSIS — F329 Major depressive disorder, single episode, unspecified: Secondary | ICD-10-CM

## 2015-01-15 DIAGNOSIS — J449 Chronic obstructive pulmonary disease, unspecified: Secondary | ICD-10-CM

## 2015-01-15 DIAGNOSIS — E039 Hypothyroidism, unspecified: Secondary | ICD-10-CM

## 2015-01-15 DIAGNOSIS — I5042 Chronic combined systolic (congestive) and diastolic (congestive) heart failure: Secondary | ICD-10-CM | POA: Diagnosis not present

## 2015-01-15 DIAGNOSIS — F32A Depression, unspecified: Secondary | ICD-10-CM

## 2015-01-15 MED ORDER — MIRTAZAPINE 7.5 MG PO TABS: 7.5000 mg | ORAL_TABLET | Freq: Every day | ORAL | Status: DC

## 2015-01-15 NOTE — Progress Notes (Signed)
Patient ID: Miranda [name], female   DOB: January 13, 1966, 49 y.o.   MRN: [id number]   Location:  [facility]  Code Status: full code Goals of Care: Advanced Directive information Does patient have an advance directive?: No, Would patient like information on creating an advanced directive?: Yes - Educational materials given   Chief Complaint  Patient presents with  . Follow-up    1 month follow-up     HPI: Patient is a 49 y.o. black female seen in the office today for one month f/u.    Mood doing better.  Sleeping ok at times a little better.  Still has no appetite and has lost 4 more lbs.  Has tried to download the formulary and did show it to me on her phone today.  Mirtazapine is on there for generic formulary.  Mother smokes and handicapped.  When didn't stay with her, her breathing got much better in the past week.  Last EF was almost normal in 3/15.   She has no new concerns.  We need to still f/u her tsh in 2 wks.  Review of Systems:  Review of Systems  Constitutional: Negative for fever and chills.  HENT: Negative for congestion.   Respiratory: Negative for cough, sputum production and shortness of breath.   Cardiovascular: Negative for chest pain and leg swelling.  Gastrointestinal: Negative for abdominal pain, constipation, blood in stool and melena.  Genitourinary: Negative for dysuria, urgency and frequency.  Musculoskeletal: Negative for myalgias and falls.  Skin: Negative for rash.  Neurological: Negative for dizziness.  Endo/Heme/Allergies: Does not bruise/bleed easily.  Psychiatric/Behavioral: Positive for depression. The patient has insomnia.        Improved mood, but appetite still poor and only sleeps well sometimes    Past Medical History  Diagnosis Date  . Hypertension   . At risk for sudden cardiac death 11-Nov-2012  . CHF - Combined Systolic + Diastolic Dysfunction. EF of 15-20% with Grade II diastolic dysfunction  on echo 10/27/12 10/28/2012  . S/P cardiac catheterization, 11-Nov-2012, normal coronaries with minimal luminal irregularities in RCA system 10/30/2012  . Thyroid disease     hypothyroidism  . Anemia   . Fibroids     Past Surgical History  Procedure Laterality Date  . Left and right heart catheterization with coronary angiogram N/A 11-11-12    Procedure: LEFT AND RIGHT HEART CATHETERIZATION WITH CORONARY ANGIOGRAM;  Surgeon: Leonie Man, MD;  Location: [facility] CATH LAB;  Service: Cardiovascular;  Laterality: N/A;    Allergies  Allergen Reactions  . Codeine Hives   Medications: Patient's Medications  New Prescriptions   MIRTAZAPINE (REMERON) 7.5 MG TABLET    Take 1 tablet (7.5 mg total) by mouth at bedtime.  Previous Medications   ACETAMINOPHEN (TYLENOL) 500 MG TABLET    Take 500 mg by mouth as needed.   ASPIRIN EC 81 MG EC TABLET    Take 1 tablet (81 mg total) by mouth daily.   ATORVASTATIN (LIPITOR) 10 MG TABLET    Take 1 tablet (10 mg total) by mouth daily at 6 PM.   BUPROPION (WELLBUTRIN XL) 150 MG 24 HR TABLET    Take 1 tablet (150 mg total) by mouth daily.   CARVEDILOL (COREG) 6.25 MG TABLET    Take 1 tablet (6.25 mg total) by mouth 2 (two) times daily with a meal.   FERROUS GLUCONATE (FERGON) 324 MG TABLET    Take 1 tablet (324 mg total) by mouth  2 (two) times daily with a meal.   LEVOTHYROXINE (SYNTHROID, LEVOTHROID) 88 MCG TABLET    Take 1 tablet (88 mcg total) by mouth daily before breakfast.   LISINOPRIL (PRINIVIL,ZESTRIL) 2.5 MG TABLET    Take 1 tablet (2.5 mg total) by mouth 2 (two) times daily.   POTASSIUM CHLORIDE SA (K-DUR,KLOR-CON) 20 MEQ TABLET    Take 1 tablet (20 mEq total) by mouth 2 (two) times daily.   TRAZODONE (DESYREL) 150 MG TABLET    Take 1 tablet (150 mg total) by mouth at bedtime as needed for sleep.  Modified Medications   No medications on file  Discontinued Medications   No medications on file    Physical Exam: Filed Vitals:   01/15/15 0844  BP:  138/76  Pulse: 73  Temp: 97.7 F (36.5 C)  TempSrc: Oral  Resp: 18  Height: 5\' 4"  (1.626 m)  Weight: 129 lb 3.2 oz (58.605 kg)  SpO2: 97%   Physical Exam  Constitutional: She is oriented to person, place, and time. She appears well-developed and well-nourished. No distress.  Thin black female  Cardiovascular: Normal rate, regular rhythm, normal heart sounds and intact distal pulses.   Pulmonary/Chest: Effort normal and breath sounds normal. No respiratory distress.  Abdominal: Soft. Bowel sounds are normal. She exhibits no distension. There is no tenderness.  Musculoskeletal: Normal range of motion.  Neurological: She is alert and oriented to person, place, and time.  Skin: Skin is warm and dry.  Psychiatric:  Affect seems brighter, more conversive    Labs reviewed: Basic Metabolic Panel:  Recent Labs  11/02/14 1036 12/14/14 0854  NA 140  --   K 4.2  --   CL 105  --   CO2 23  --   GLUCOSE 94  --   BUN 7  --   CREATININE 0.64  --   CALCIUM 9.3  --   TSH 10.610* 7.020*   Liver Function Tests:  Recent Labs  11/02/14 1036  AST 19  ALT 7  ALKPHOS 66  BILITOT <0.2  PROT 7.7  CBC:  Recent Labs  11/02/14 1036  WBC 4.8  NEUTROABS 3.0  HGB 12.0  HCT 37.1  MCV 87  PLT 241   Lipid Panel:  Recent Labs  11/02/14 1036  CHOL 171  HDL 52  LDLCALC 101*  TRIG 90  CHOLHDL 3.3   Lab Results  Component Value Date   HGBA1C 5.7* 11/02/2014    Assessment/Plan 1. Depression - cont trazodone at bedtime, wellbutrin in the morning to help with energy and motivation -add mirtazapine to help with appetite and sleep -advised to let me know if she needs to go up on it to 15mg  from 7.5mg   -also if she gets too sleepy in the mornings, she will let me know and we might start cutting back on trazodone - mirtazapine (REMERON) 7.5 MG tablet; Take 1 tablet (7.5 mg total) by mouth at bedtime.  Dispense: 30 tablet; Refill: 3  2. CHF - Combined Systolic + Diastolic  Dysfunction - has been stable, EF had gradually improved -should still be followed by cardiology at her 49 age with chf -now that insurance straightened out, new referral put in - Ambulatory referral to Cardiology -continues on coreg, lisinopril, lipitor, asa; not on diuretics since dietary changes, weight loss over time  3. Hypothyroidism, unspecified hypothyroidism type -I had adjusted her synthroid to 19mcg for high tsh, will recheck in 2 wks - TSH; Future  4. Chronic obstructive pulmonary  disease, unspecified COPD, unspecified chronic bronchitis type -breathing is a lot better when she does not stay with her mother who is a smoker -does not use any inhalers or treatments at this time   Labs/tests ordered:  TSH in 2 wks Next appt:  Visit in 3 mos  Elyssia Strausser L. Gram Siedlecki, D.O. [facility] [address], [place] [address] Cell Phone (Mon-Fri 8am-5pm):  [phone] On Call:  [phone] & follow prompts after 5pm & weekends Office Phone:  [phone] Office Fax:  [phone]

## 2015-01-30 ENCOUNTER — Other Ambulatory Visit: Payer: 59

## 2015-01-30 DIAGNOSIS — E039 Hypothyroidism, unspecified: Secondary | ICD-10-CM

## 2015-01-31 ENCOUNTER — Telehealth: Payer: Self-pay | Admitting: *Deleted

## 2015-01-31 DIAGNOSIS — E079 Disorder of thyroid, unspecified: Secondary | ICD-10-CM

## 2015-01-31 LAB — TSH: TSH: 5.83 u[IU]/mL — ABNORMAL HIGH (ref 0.450–4.500)

## 2015-01-31 MED ORDER — LEVOTHYROXINE SODIUM 100 MCG PO TABS: ORAL_TABLET | ORAL | Status: DC

## 2015-01-31 NOTE — Telephone Encounter (Signed)
-----   Message from Gayland Curry, DO sent at 01/31/2015  8:39 AM EDT ----- TSH is still high so let's increase her synthroid from 88 to 127mcg daily on empty stomach at least thirty mins before breakfast and other pills AND recheck in 6 weeks

## 2015-01-31 NOTE — Telephone Encounter (Signed)
Patient notified of labwork and agreed with instructions. Will D/C the 72mcg and start the 140mcg. Faxed Rx to [facility] and appointment made for labs in 6 weeks. Order placed.

## 2015-02-09 ENCOUNTER — Telehealth (HOSPITAL_COMMUNITY): Payer: Self-pay | Admitting: Vascular Surgery

## 2015-02-09 ENCOUNTER — Encounter (HOSPITAL_COMMUNITY): Payer: Self-pay | Admitting: Vascular Surgery

## 2015-02-09 NOTE — Telephone Encounter (Signed)
Left pt message to get an appt w/ [place] in Aug..

## 2015-02-12 ENCOUNTER — Telehealth: Payer: Self-pay | Admitting: *Deleted

## 2015-02-12 DIAGNOSIS — I5042 Chronic combined systolic (congestive) and diastolic (congestive) heart failure: Secondary | ICD-10-CM

## 2015-02-12 NOTE — Telephone Encounter (Signed)
Patient called and stated that she now needs a referral to Cardiology due to Dr. Mariea Clonts being her PCP. Would like a referral to Dr. Candee Furbish with [facility] [phone]. Is this ok? Please Advise.

## 2015-02-12 NOTE — Telephone Encounter (Signed)
[place] with  referral to Dr. Marlou Porch.  Needed this due to her insurance.

## 2015-02-12 NOTE — Telephone Encounter (Signed)
Referral Placed and patient aware.

## 2015-03-07 ENCOUNTER — Other Ambulatory Visit: Payer: 59

## 2015-03-07 DIAGNOSIS — E079 Disorder of thyroid, unspecified: Secondary | ICD-10-CM

## 2015-03-08 LAB — TSH: TSH: 4.59 u[IU]/mL — ABNORMAL HIGH (ref 0.450–4.500)

## 2015-03-09 LAB — SPECIMEN STATUS REPORT

## 2015-03-09 LAB — T4, FREE: Free T4: 1.21 ng/dL (ref 0.82–1.77)

## 2015-03-12 ENCOUNTER — Other Ambulatory Visit (HOSPITAL_COMMUNITY): Payer: Self-pay | Admitting: Internal Medicine

## 2015-03-12 ENCOUNTER — Other Ambulatory Visit: Payer: Self-pay | Admitting: Internal Medicine

## 2015-04-11 ENCOUNTER — Other Ambulatory Visit: Payer: Self-pay | Admitting: Internal Medicine

## 2015-04-19 ENCOUNTER — Encounter: Payer: Self-pay | Admitting: Internal Medicine

## 2015-04-19 ENCOUNTER — Ambulatory Visit (INDEPENDENT_AMBULATORY_CARE_PROVIDER_SITE_OTHER): Payer: 59 | Admitting: Internal Medicine

## 2015-04-19 VITALS — BP 162/118 | HR 68 | Temp 97.9°F | Resp 18 | Ht 64.0 in | Wt 135.0 lb

## 2015-04-19 DIAGNOSIS — J449 Chronic obstructive pulmonary disease, unspecified: Secondary | ICD-10-CM | POA: Diagnosis not present

## 2015-04-19 DIAGNOSIS — E039 Hypothyroidism, unspecified: Secondary | ICD-10-CM | POA: Diagnosis not present

## 2015-04-19 DIAGNOSIS — D259 Leiomyoma of uterus, unspecified: Secondary | ICD-10-CM

## 2015-04-19 DIAGNOSIS — F32A Depression, unspecified: Secondary | ICD-10-CM

## 2015-04-19 DIAGNOSIS — I5042 Chronic combined systolic (congestive) and diastolic (congestive) heart failure: Secondary | ICD-10-CM | POA: Diagnosis not present

## 2015-04-19 DIAGNOSIS — F329 Major depressive disorder, single episode, unspecified: Secondary | ICD-10-CM

## 2015-04-19 DIAGNOSIS — E785 Hyperlipidemia, unspecified: Secondary | ICD-10-CM | POA: Diagnosis not present

## 2015-04-19 DIAGNOSIS — D500 Iron deficiency anemia secondary to blood loss (chronic): Secondary | ICD-10-CM | POA: Diagnosis not present

## 2015-04-19 NOTE — Progress Notes (Signed)
Patient ID: Miranda [name], female   DOB: 04/24/1966, 49 y.o.   MRN: [id number]   Location:  [facility]  Code Status: full code Goals of Care: Advanced Directive information Does patient have an advance directive?: No, Would patient like information on creating an advanced directive?: Yes - Educational materials given   Chief Complaint  Patient presents with  . Medical Management of Chronic Issues    HPI: Patient is a 49 y.o. female seen in the office today for med mgt of chronic diseases.    Her blood pressure is very high this am at 162/118.  Did not take her medication last night or this am.  Forgot somehow even though it's beside her.    Said mood is a lot better.  Energy level better.  Weight up 6 lbs.  Appetite is improved.  Sleeping well at night also.  Sees Dr. Marlou Porch next week about her heart.  No shortness of breath.  No chest pains.    Hypothyroidism:  Improved also.  TSH was slightly elevated but free T4 normal one month ago so no changes were made to current dosage.  Says she has not had the flu.  Says until she gets the flu, she's not going to get the flu shot.  Says she may do it later in the season.  Explained that it should not make her sick.  Fibroids:  Only one cycle this year/no other bleeding.  Had a hot flash during the visit.    Colonoscopy due:  She is afraid to be put to sleep.  Has never been put to sleep.  Knows she needs to go.  Wants to delay this also.  Explained sedation is not same as surgery, but she is still afraid.  Mammogram:  Continues to want to delay.   No concerns about lumps.  Is not checking herself monthly--counseled on this.  Review of Systems:  Review of Systems  Constitutional: Negative for fever and chills.  HENT: Negative for congestion.        Nystagmus  Eyes: Negative for blurred vision.  Respiratory: Negative for shortness of breath.   Cardiovascular: Negative for chest pain,  palpitations and leg swelling.  Gastrointestinal: Negative for abdominal pain.  Genitourinary: Negative for dysuria, urgency and frequency.  Musculoskeletal: Negative for myalgias and falls.  Skin: Negative for itching and rash.  Neurological: Negative for dizziness, loss of consciousness and headaches.  Endo/Heme/Allergies:       Hot flashes  Psychiatric/Behavioral: Negative for depression and memory loss. The patient does not have insomnia.        Depression better    Past Medical History  Diagnosis Date  . Hypertension   . At risk for sudden cardiac death 10/30/2012  . CHF - Combined Systolic + Diastolic Dysfunction. EF of 15-20% with Grade II diastolic dysfunction on echo 10/27/12 10/28/2012  . S/P cardiac catheterization, 10/30/12, normal coronaries with minimal luminal irregularities in RCA system 10/30/2012  . Thyroid disease     hypothyroidism  . Anemia   . Fibroids     Past Surgical History  Procedure Laterality Date  . Left and right heart catheterization with coronary angiogram N/A 10/30/2012    Procedure: LEFT AND RIGHT HEART CATHETERIZATION WITH CORONARY ANGIOGRAM;  Surgeon: Leonie Man, MD;  Location: [facility] CATH LAB;  Service: Cardiovascular;  Laterality: N/A;    Allergies  Allergen Reactions  . Codeine Hives   Medications: Patient's Medications  New Prescriptions  No medications on file  Previous Medications   ACETAMINOPHEN (TYLENOL) 500 MG TABLET    Take 500 mg by mouth as needed.   ASPIRIN EC 81 MG EC TABLET    Take 1 tablet (81 mg total) by mouth daily.   ATORVASTATIN (LIPITOR) 10 MG TABLET    TAKE ONE TABLET BY MOUTH ONCE DAILY AT 6 PM   BUPROPION (WELLBUTRIN XL) 150 MG 24 HR TABLET    TAKE ONE TABLET BY MOUTH ONCE DAILY   CARVEDILOL (COREG) 6.25 MG TABLET    Take 1 tablet (6.25 mg total) by mouth 2 (two) times daily with a meal.   FERROUS GLUCONATE (FERGON) 324 MG TABLET    Take 1 tablet (324 mg total) by mouth 2 (two) times daily with a meal.   KLOR-CON  M20 20 MEQ TABLET    TAKE ONE TABLET BY MOUTH TWICE DAILY   LEVOTHYROXINE (SYNTHROID, LEVOTHROID) 100 MCG TABLET    TAKE ONE TABLET BY MOUTH ONCE DAILY 30 MINUTES BEFORE BREAKFAST FOR THYROID   LISINOPRIL (PRINIVIL,ZESTRIL) 2.5 MG TABLET    Take 1 tablet (2.5 mg total) by mouth 2 (two) times daily.   MIRTAZAPINE (REMERON) 7.5 MG TABLET    Take 1 tablet (7.5 mg total) by mouth at bedtime.   TRAZODONE (DESYREL) 150 MG TABLET    TAKE ONE TABLET BY MOUTH ONCE DAILY AT BEDTIME AS NEEDED FOR SLEEP  Modified Medications   No medications on file  Discontinued Medications   BUPROPION (WELLBUTRIN XL) 150 MG 24 HR TABLET    TAKE ONE TABLET BY MOUTH ONCE DAILY   TRAZODONE (DESYREL) 150 MG TABLET    TAKE ONE TABLET BY MOUTH ONCE DAILY AT BEDTIME AS NEEDED FOR SLEEP    Physical Exam: Filed Vitals:   04/19/15 0837  BP: 162/118  Pulse: 68  Temp: 97.9 F (36.6 C)  TempSrc: Oral  Resp: 18  Height: 5\' 4"  (1.626 m)  Weight: 135 lb (61.236 kg)  SpO2: 98%   Physical Exam  Constitutional: She is oriented to person, place, and time. She appears well-nourished. No distress.  Thin black female  HENT:  Head: Normocephalic and atraumatic.  Cardiovascular: Normal rate, regular rhythm, normal heart sounds and intact distal pulses.   Pulmonary/Chest: Effort normal and breath sounds normal. No respiratory distress. She has no rales.  Abdominal: Soft. Bowel sounds are normal. She exhibits no distension. There is no tenderness.  Musculoskeletal: Normal range of motion.  Neurological: She is alert and oriented to person, place, and time.  Skin: Skin is warm and dry.  Psychiatric:  Flat affect, does not make regular eye contact    Labs reviewed: Basic Metabolic Panel:  Recent Labs  11/02/14 1036 12/14/14 0854 01/30/15 0846 03/07/15 0943  NA 140  --   --   --   K 4.2  --   --   --   CL 105  --   --   --   CO2 23  --   --   --   GLUCOSE 94  --   --   --   BUN 7  --   --   --   CREATININE 0.64  --    --   --   CALCIUM 9.3  --   --   --   TSH 10.610* 7.020* 5.830* 4.590*   Liver Function Tests:  Recent Labs  11/02/14 1036  AST 19  ALT 7  ALKPHOS 66  BILITOT <0.2  PROT 7.7  No results for input(s): LIPASE, AMYLASE in the last 8760 hours. No results for input(s): AMMONIA in the last 8760 hours. CBC:  Recent Labs  11/02/14 1036  WBC 4.8  NEUTROABS 3.0  HGB 12.0  HCT 37.1  MCV 87  PLT 241   Lipid Panel:  Recent Labs  11/02/14 1036  CHOL 171  HDL 52  LDLCALC 101*  TRIG 90  CHOLHDL 3.3   Lab Results  Component Value Date   HGBA1C 5.7* 11/02/2014   Assessment/Plan 1. Heart failure, systolic and diastolic, chronic -has been stable, last EF had improved to 50% in 3/15 -is finally getting back into cardiology clinic with Dr Marlou Porch tomorrow -is asymptomatic and without exacerbations since I have been seeing her--follows a low sodium diet and exercises regularly -cont ace, coreg  2. Hypothyroidism, unspecified hypothyroidism type -cont synthroid at current dose, recheck TSH next year  3. Depression -improved on higher dosage wellbutrin and with trazodone at hs  4. Chronic obstructive pulmonary disease, unspecified COPD, unspecified chronic bronchitis type -stable, no longer smokes -per Dr. Bari Mantis note from 07/06/13 (last time seen at pulmonary): -Spirometry done as a part of a cardiopulmonary stress test showed severe airway obstruction with FEV1 of 0.82-34%, FVC of 1.87-63% with a ratio 44. Flow volume loop showed a box pattern suggesting a fixed extrathoracic airway obstruction. She was Admitted to [facility] 3/24 - 8/65/78 with new systolic heart failure. 10/26/12 ECHO EF 15-20%. There was diffuse hypokinesis. Grade 2 diastolic dysfunction, mild MR, moderate-severely dilated LA.  01/24/13 repeat ECHO EF 35-40%, Life vest removed CT of chest on 06/06/13 showed Mixed airspace and interstitial infiltrate within the lung bases. This appears much improved compared to 10/2012  which was more consistent with heart failure. ANA positive 1 : 160 She smoked less than 10 pack years and quit in 1980. She smoked an occasional cigar but quit in 10/2012.  5. Iron deficiency anemia due to chronic blood loss - f/u cbc when she can come fasting in the next few weeks  6. Uterine leiomyoma, unspecified location -had only one menstrual cycle this year due to going through menopause -no recent pain or bleeding  7. Hyperlipidemia -continues on statin therapy with lipitor -f/u flp in next few weeks  Labs/tests ordered: Orders Placed This Encounter  Procedures  . CBC with Differential/Platelet    Standing Status: Future     Number of Occurrences:      Standing Expiration Date: 06/04/2015  . Comprehensive metabolic panel    Standing Status: Future     Number of Occurrences:      Standing Expiration Date: 06/04/2015    Order Specific Question:  Has the patient fasted?    Answer:  Yes  . Lipid panel    Standing Status: Future     Number of Occurrences:      Standing Expiration Date: 06/04/2015    Order Specific Question:  Has the patient fasted?    Answer:  Yes    Next appt:  3 mos; but labs asap in next few weeks fasting  Vivianne Carles L. Zuly Belkin, D.O. [facility] [address], [place] [address] Cell Phone (Mon-Fri 8am-5pm):  [phone] On Call:  [phone] & follow prompts after 5pm & weekends Office Phone:  [phone] Office Fax:  [phone]

## 2015-04-24 ENCOUNTER — Encounter: Payer: Self-pay | Admitting: Cardiology

## 2015-04-24 ENCOUNTER — Ambulatory Visit (INDEPENDENT_AMBULATORY_CARE_PROVIDER_SITE_OTHER): Payer: 59 | Admitting: Cardiology

## 2015-04-24 VITALS — BP 160/78 | HR 70 | Ht 64.0 in | Wt 135.1 lb

## 2015-04-24 DIAGNOSIS — Z9889 Other specified postprocedural states: Secondary | ICD-10-CM

## 2015-04-24 DIAGNOSIS — Z79899 Other long term (current) drug therapy: Secondary | ICD-10-CM

## 2015-04-24 DIAGNOSIS — Z8679 Personal history of other diseases of the circulatory system: Secondary | ICD-10-CM | POA: Insufficient documentation

## 2015-04-24 DIAGNOSIS — I5042 Chronic combined systolic (congestive) and diastolic (congestive) heart failure: Secondary | ICD-10-CM

## 2015-04-24 DIAGNOSIS — E785 Hyperlipidemia, unspecified: Secondary | ICD-10-CM

## 2015-04-24 DIAGNOSIS — I10 Essential (primary) hypertension: Secondary | ICD-10-CM

## 2015-04-24 MED ORDER — ATORVASTATIN CALCIUM 10 MG PO TABS: ORAL_TABLET | ORAL | Status: DC

## 2015-04-24 MED ORDER — CARVEDILOL 6.25 MG PO TABS: 6.2500 mg | ORAL_TABLET | Freq: Two times a day (BID) | ORAL | Status: DC

## 2015-04-24 MED ORDER — LISINOPRIL 2.5 MG PO TABS
2.5000 mg | ORAL_TABLET | Freq: Two times a day (BID) | ORAL | Status: DC
Start: 2015-04-24 — End: 2016-01-25

## 2015-04-24 NOTE — Progress Notes (Signed)
Cardiology Office Note   Date:  04/24/2015   ID:  Miranda [name], DOB 04/23/1966, MRN [id number]  PCP:  Hollace Kinnier, DO  Cardiologist:   Candee Furbish, MD     History of Present Illness: Miranda [name] is a 49 y.o. female here for chronic systolic heart failure, cardiomyopathy follow-up, nonischemic cardiomyopathy. Previous ejection fraction was 15%. At last check, EF has improved to 50% in March 2015. Asymptomatic. Doing very well. ACE inhibitor and carvedilol. She has stopped taking Lasix. She is still on potassium supplementation however. We will discontinue. No syncope. Her EKG at baseline is abnormal with T-wave inversion in multiple leads. This is no change from prior. She does not have LVH on echo. No high-risk symptoms such as syncope. Once again, cardiac catheterization was normal with no coronary artery disease.  Her primary physician Dr. Mariea Clonts, asked for her to be seen because of her prior cardiomyopathy. She used to see Dr. Haroldine Laws.    Past Medical History  Diagnosis Date  . Hypertension   . At risk for sudden cardiac death 11-28-12  . CHF - Combined Systolic + Diastolic Dysfunction. EF of 15-20% with Grade II diastolic dysfunction on echo 10/27/12 10/28/2012  . S/P cardiac catheterization, 28-Nov-2012, normal coronaries with minimal luminal irregularities in RCA system 10/30/2012  . Thyroid disease     hypothyroidism  . Anemia   . Fibroids     Past Surgical History  Procedure Laterality Date  . Left and right heart catheterization with coronary angiogram N/A 11/28/12    Procedure: LEFT AND RIGHT HEART CATHETERIZATION WITH CORONARY ANGIOGRAM;  Surgeon: Leonie Man, MD;  Location: [facility] CATH LAB;  Service: Cardiovascular;  Laterality: N/A;     Current Outpatient Prescriptions  Medication Sig Dispense Refill  . acetaminophen (TYLENOL) 500 MG tablet Take 500 mg by mouth as needed.    Marland Kitchen aspirin EC 81 MG EC tablet Take 1 tablet (81 mg total) by mouth daily.    Marland Kitchen  atorvastatin (LIPITOR) 10 MG tablet TAKE ONE TABLET BY MOUTH ONCE DAILY AT 6 PM 30 tablet 11  . buPROPion (WELLBUTRIN XL) 150 MG 24 hr tablet TAKE ONE TABLET BY MOUTH ONCE DAILY 30 tablet 0  . carvedilol (COREG) 6.25 MG tablet Take 1 tablet (6.25 mg total) by mouth 2 (two) times daily with a meal. 180 tablet 3  . ferrous gluconate (FERGON) 324 MG tablet Take 1 tablet (324 mg total) by mouth 2 (two) times daily with a meal. (Patient taking differently: Take 324 mg by mouth daily with breakfast. ) 60 tablet 3  . levothyroxine (SYNTHROID, LEVOTHROID) 100 MCG tablet TAKE ONE TABLET BY MOUTH ONCE DAILY 30 MINUTES BEFORE BREAKFAST FOR THYROID 30 tablet 0  . lisinopril (PRINIVIL,ZESTRIL) 2.5 MG tablet Take 1 tablet (2.5 mg total) by mouth 2 (two) times daily. 180 tablet 3  . mirtazapine (REMERON) 7.5 MG tablet Take 1 tablet (7.5 mg total) by mouth at bedtime. 30 tablet 3  . traZODone (DESYREL) 150 MG tablet TAKE ONE TABLET BY MOUTH ONCE DAILY AT BEDTIME AS NEEDED FOR SLEEP 30 tablet 0   No current facility-administered medications for this visit.    Allergies:   Codeine    Social History:  The patient  reports that she quit smoking about 36 years ago. Her smoking use included Cigars. She has never used smokeless tobacco. She reports that she does not drink alcohol or use illicit drugs.   Family History:  The patient's family history includes Arthritis in  her paternal uncle; Autoimmune disease in her mother; Breast cancer in her cousin; Cancer in her maternal uncle and paternal grandfather; Hypertension in her father, maternal grandmother, and mother; Stroke in her father.    ROS:  Please see the history of present illness.   Otherwise, review of systems are positive for depression, sweating..   All other systems are reviewed and negative.    PHYSICAL EXAM: VS:  BP 160/78 mmHg  Pulse 70  Ht 5\' 4"  (1.626 m)  Wt 135 lb 1.9 oz (61.29 kg)  BMI 23.18 kg/m2  LMP 01/14/2015 , BMI Body mass index is  23.18 kg/(m^2). GEN: Well nourished, well developed, in no acute distress HEENT: normal Neck: no JVD, carotid bruits, or masses Cardiac: RRR; no murmurs, rubs, or gallops,no edema  Respiratory:  clear to auscultation bilaterally, normal work of breathing GI: soft, nontender, nondistended, + BS MS: no deformity or atrophy Skin: warm and dry, no rash Neuro:  Strength and sensation are intact Psych: euthymic mood, full affect   EKG:  Today 04/24/15-sinus rhythm, 78, T wave inversion in multiple leads, no change from prior. QT interval 408 ms. Personally viewed.  ECHO: 10/19/13-ejection fraction 50%, increased from prior of 15%.   Recent Labs: 11/02/2014: ALT 7; BUN 7; Creatinine, Ser 0.64; Hemoglobin 12.0; Platelets 241; Potassium 4.2; Sodium 140 03/07/2015: TSH 4.590*    Lipid Panel    Component Value Date/Time   CHOL 171 11/02/2014 1036   CHOL 128 11/11/2013 0916   TRIG 90 11/02/2014 1036   HDL 52 11/02/2014 1036   HDL 32* 11/11/2013 0916   CHOLHDL 3.3 11/02/2014 1036   CHOLHDL 4.0 11/11/2013 0916   VLDL 17 11/11/2013 0916   LDLCALC 101* 11/02/2014 1036   LDLCALC 79 11/11/2013 0916      Wt Readings from Last 3 Encounters:  04/24/15 135 lb 1.9 oz (61.29 kg)  04/19/15 135 lb (61.236 kg)  01/15/15 129 lb 3.2 oz (58.605 kg)      Other studies Reviewed: Additional studies/ records that were reviewed today include: Labs, office notes, echo, catheterization, EKG. Review of the above records demonstrates: As above   ASSESSMENT AND PLAN:  1.  Prior cardiomyopathy, nonischemic with prior systolic heart failure. Ejection fraction has been restored to normal, 50%. She is feeling well with no symptoms, no orthopnea, no PND, no shortness of breath with exertion, no syncope or other high risk symptoms. No CAD on catheterization.  - Continue with carvedilol 6.25 mg twice a day  - Continue with lisinopril 2.5 mg twice a day  - She is no longer taking Lasix. We will stop her  supplemental potassium.  - Check basic metabolic profile in one month to ensure that she is not hypokalemic.  - Reassuring.  2. Abnormal EKG  - This is her baseline. Remember, no CAD. Her ejection fraction now is normal.  3. Hyperlipidemia  - Atorvastatin 10 mg. We will refill.  4. Essential hypertension  - Blood pressure elevated today's visit however on previous visits it has been normal. Continue with current regimen.  Current medicines are reviewed at length with the patient today.  The patient does not have concerns regarding medicines.  The following changes have been made:  Stop potassium  Labs/ tests ordered today include: BMET in one month  Orders Placed This Encounter  Procedures  . Basic Metabolic Panel (BMET)  . EKG 12-Lead     Disposition:   FU with [facility] in 1 year  Signed, Candee Furbish, MD  04/24/2015 9:39 AM    [facility] [address], [place], [place]  [address] Phone: [phone]; Fax: [phone]

## 2015-04-24 NOTE — Patient Instructions (Signed)
Medication Instructions:  Please stop your potassium. Continue all other medications as listed.  Labwork: Please have blood work in 1 month. (BMP)  Follow-Up: Follow up in 1 year with Dr. Marlou Porch.  You will receive a letter in the mail 2 months before you are due.  Please call us when you receive this letter to schedule your follow up appointment.  Thank you for choosing [facility]!!

## 2015-05-13 ENCOUNTER — Other Ambulatory Visit: Payer: Self-pay | Admitting: Internal Medicine

## 2015-05-24 ENCOUNTER — Other Ambulatory Visit (INDEPENDENT_AMBULATORY_CARE_PROVIDER_SITE_OTHER): Payer: 59 | Admitting: *Deleted

## 2015-05-24 DIAGNOSIS — I10 Essential (primary) hypertension: Secondary | ICD-10-CM

## 2015-05-24 DIAGNOSIS — Z79899 Other long term (current) drug therapy: Secondary | ICD-10-CM

## 2015-05-24 DIAGNOSIS — I5042 Chronic combined systolic (congestive) and diastolic (congestive) heart failure: Secondary | ICD-10-CM

## 2015-05-24 DIAGNOSIS — E785 Hyperlipidemia, unspecified: Secondary | ICD-10-CM

## 2015-05-24 DIAGNOSIS — D500 Iron deficiency anemia secondary to blood loss (chronic): Secondary | ICD-10-CM

## 2015-05-24 LAB — BASIC METABOLIC PANEL
BUN: 9 mg/dL (ref 7–25)
CO2: 23 mmol/L (ref 20–31)
Calcium: 9 mg/dL (ref 8.6–10.4)
Chloride: 104 mmol/L (ref 98–110)
Creat: 0.69 mg/dL (ref 0.50–1.05)
Glucose, Bld: 98 mg/dL (ref 65–99)
Potassium: 3.7 mmol/L (ref 3.5–5.3)
Sodium: 137 mmol/L (ref 135–146)

## 2015-05-24 NOTE — Addendum Note (Signed)
Addended by: Eulis Foster on: 05/24/2015 10:22 AM   Modules accepted: Orders

## 2015-06-11 ENCOUNTER — Other Ambulatory Visit: Payer: Self-pay | Admitting: Internal Medicine

## 2015-06-18 ENCOUNTER — Telehealth: Payer: Self-pay

## 2015-06-18 DIAGNOSIS — E785 Hyperlipidemia, unspecified: Secondary | ICD-10-CM

## 2015-06-18 DIAGNOSIS — E039 Hypothyroidism, unspecified: Secondary | ICD-10-CM

## 2015-06-18 DIAGNOSIS — I10 Essential (primary) hypertension: Secondary | ICD-10-CM

## 2015-06-18 DIAGNOSIS — Z79899 Other long term (current) drug therapy: Secondary | ICD-10-CM

## 2015-06-18 NOTE — Telephone Encounter (Signed)
Patient has pending appointment to get labs on 07/16/15. Future orders placed

## 2015-06-18 NOTE — Telephone Encounter (Signed)
-----   Message from Gayland Curry, DO sent at 06/16/2015  8:29 AM EST ----- Pt missed her cbc, cmp and lipid panel I ordered.  Please call her. ----- Message -----    From: SYSTEM    Sent: 06/09/2015  12:04 AM      To: Gayland Curry, DO

## 2015-07-16 ENCOUNTER — Other Ambulatory Visit: Payer: 59

## 2015-07-16 DIAGNOSIS — E039 Hypothyroidism, unspecified: Secondary | ICD-10-CM

## 2015-07-16 DIAGNOSIS — I10 Essential (primary) hypertension: Secondary | ICD-10-CM

## 2015-07-16 DIAGNOSIS — Z79899 Other long term (current) drug therapy: Secondary | ICD-10-CM

## 2015-07-16 DIAGNOSIS — E785 Hyperlipidemia, unspecified: Secondary | ICD-10-CM

## 2015-07-17 LAB — CBC WITH DIFFERENTIAL/PLATELET
Basophils Absolute: 0 10*3/uL (ref 0.0–0.2)
Basos: 0 %
EOS (ABSOLUTE): 0.1 10*3/uL (ref 0.0–0.4)
Eos: 3 %
Hematocrit: 38.5 % (ref 34.0–46.6)
Hemoglobin: 12.7 g/dL (ref 11.1–15.9)
Immature Grans (Abs): 0 10*3/uL (ref 0.0–0.1)
Immature Granulocytes: 0 %
Lymphocytes Absolute: 1.1 10*3/uL (ref 0.7–3.1)
Lymphs: 24 %
MCH: 27.7 pg (ref 26.6–33.0)
MCHC: 33 g/dL (ref 31.5–35.7)
MCV: 84 fL (ref 79–97)
Monocytes Absolute: 0.3 10*3/uL (ref 0.1–0.9)
Monocytes: 6 %
Neutrophils Absolute: 3.2 10*3/uL (ref 1.4–7.0)
Neutrophils: 67 %
Platelets: 212 10*3/uL (ref 150–379)
RBC: 4.59 x10E6/uL (ref 3.77–5.28)
RDW: 14.1 % (ref 12.3–15.4)
WBC: 4.7 10*3/uL (ref 3.4–10.8)

## 2015-07-17 LAB — COMPREHENSIVE METABOLIC PANEL
ALT: 11 IU/L (ref 0–32)
AST: 19 IU/L (ref 0–40)
Albumin/Globulin Ratio: 0.8 — ABNORMAL LOW (ref 1.1–2.5)
Albumin: 3.3 g/dL — ABNORMAL LOW (ref 3.5–5.5)
Alkaline Phosphatase: 64 IU/L (ref 39–117)
BUN/Creatinine Ratio: 23 (ref 9–23)
BUN: 15 mg/dL (ref 6–24)
Bilirubin Total: <0.2 mg/dL (ref 0.0–1.2)
CO2: 23 mmol/L (ref 18–29)
Calcium: 9.3 mg/dL (ref 8.7–10.2)
Chloride: 105 mmol/L (ref 96–106)
Creatinine, Ser: 0.65 mg/dL (ref 0.57–1.00)
GFR calc Af Amer: 119 mL/min/{1.73_m2} (ref >59)
GFR calc non Af Amer: 103 mL/min/{1.73_m2} (ref >59)
Globulin, Total: 3.9 g/dL (ref 1.5–4.5)
Glucose: 96 mg/dL (ref 65–99)
Potassium: 4.1 mmol/L (ref 3.5–5.2)
Sodium: 141 mmol/L (ref 134–144)
Total Protein: 7.2 g/dL (ref 6.0–8.5)

## 2015-07-17 LAB — LIPID PANEL
Chol/HDL Ratio: 3 ratio units (ref 0.0–4.4)
Cholesterol, Total: 179 mg/dL (ref 100–199)
HDL: 59 mg/dL (ref >39)
LDL Calculated: 100 mg/dL — ABNORMAL HIGH (ref 0–99)
Triglycerides: 98 mg/dL (ref 0–149)
VLDL Cholesterol Cal: 20 mg/dL (ref 5–40)

## 2015-07-17 LAB — TSH: TSH: 5.27 u[IU]/mL — ABNORMAL HIGH (ref 0.450–4.500)

## 2015-07-20 ENCOUNTER — Ambulatory Visit (INDEPENDENT_AMBULATORY_CARE_PROVIDER_SITE_OTHER): Payer: 59 | Admitting: Internal Medicine

## 2015-07-20 ENCOUNTER — Encounter: Payer: Self-pay | Admitting: Internal Medicine

## 2015-07-20 VITALS — BP 132/80 | HR 77 | Temp 97.5°F | Resp 20 | Ht 64.0 in | Wt 138.2 lb

## 2015-07-20 DIAGNOSIS — F329 Major depressive disorder, single episode, unspecified: Secondary | ICD-10-CM | POA: Diagnosis not present

## 2015-07-20 DIAGNOSIS — E039 Hypothyroidism, unspecified: Secondary | ICD-10-CM | POA: Diagnosis not present

## 2015-07-20 DIAGNOSIS — I10 Essential (primary) hypertension: Secondary | ICD-10-CM

## 2015-07-20 DIAGNOSIS — D500 Iron deficiency anemia secondary to blood loss (chronic): Secondary | ICD-10-CM

## 2015-07-20 DIAGNOSIS — E785 Hyperlipidemia, unspecified: Secondary | ICD-10-CM

## 2015-07-20 DIAGNOSIS — G930 Cerebral cysts: Secondary | ICD-10-CM | POA: Diagnosis not present

## 2015-07-20 DIAGNOSIS — F32A Depression, unspecified: Secondary | ICD-10-CM

## 2015-07-20 MED ORDER — LEVOTHYROXINE SODIUM 112 MCG PO TABS: 112.0000 ug | ORAL_TABLET | Freq: Every day | ORAL | Status: DC

## 2015-07-20 NOTE — Progress Notes (Signed)
Patient ID: Miranda [name], female   DOB: 1966-02-18, 49 y.o.   MRN: [id number]   Location: [place] Provider: Rexene Edison. Mariea Clonts, D.O., C.M.D.  Code Status: full code Goals of Care: Advanced Directive information Does patient have an advance directive?: No, Would patient like information on creating an advanced directive?: Yes - Educational materials given  Chief Complaint  Patient presents with  . Medical Management of Chronic Issues    3 mo f/u  hyperlipidemia, HTN    HPI: Patient is a 49 y.o. female seen in the office today for f/u on htn, hyperlipidemia,obstructive airway disease,  H/o CHF, microcytic anemia from menorrhagia and fibroid and depression.    Dr Marlou Porch saw her 04/24/15.  Sees him once a year.    Mood is doing better.  Taking wellbutrin, remeron and trazodone.  Sleeping better.  Appetite variable.  Weight is stable.  (up 3 lbs)  No hallucinations.    Unsteady gait--stumbling less.  Still there to some degree.    Vision unchanged.  No bleeding difficulties either.  Cbc wnl.    Hypothyroidism:  Had been taking daily.  Hasn't taken past few days but that's after labs.    Review of Systems:  Review of Systems  Constitutional: Negative for malaise/fatigue.  HENT: Negative for hearing loss.   Eyes: Positive for blurred vision.  Respiratory: Negative for shortness of breath.   Cardiovascular: Negative for chest pain and leg swelling.  Gastrointestinal: Negative for abdominal pain.       Appetite still not great  Genitourinary: Negative for dysuria.       No vaginal bleed  Musculoskeletal: Negative for falls.  Neurological: Positive for dizziness.  Psychiatric/Behavioral: Negative for memory loss.    Past Medical History  Diagnosis Date  . Hypertension   . At risk for sudden cardiac death 11/12/2012  . CHF - Combined Systolic + Diastolic Dysfunction. EF of 15-20% with Grade II diastolic dysfunction on echo 10/27/12 10/28/2012  . S/P cardiac  catheterization, 2012-11-12, normal coronaries with minimal luminal irregularities in RCA system 10/30/2012  . Thyroid disease     hypothyroidism  . Anemia   . Fibroids     Past Surgical History  Procedure Laterality Date  . Left and right heart catheterization with coronary angiogram N/A 2012/11/12    Procedure: LEFT AND RIGHT HEART CATHETERIZATION WITH CORONARY ANGIOGRAM;  Surgeon: Leonie Man, MD;  Location: [facility] CATH LAB;  Service: Cardiovascular;  Laterality: N/A;    Allergies  Allergen Reactions  . Codeine Hives      Medication List       This list is accurate as of: 07/20/15  9:51 AM.  Always use your most recent med list.               acetaminophen 500 MG tablet  Commonly known as:  TYLENOL  Take 500 mg by mouth as needed.     aspirin 81 MG EC tablet  Take 1 tablet (81 mg total) by mouth daily.     atorvastatin 10 MG tablet  Commonly known as:  LIPITOR  TAKE ONE TABLET BY MOUTH ONCE DAILY AT 6 PM     buPROPion 150 MG 24 hr tablet  Commonly known as:  WELLBUTRIN XL  TAKE ONE TABLET BY MOUTH ONCE DAILY     carvedilol 6.25 MG tablet  Commonly known as:  COREG  Take 1 tablet (6.25 mg total) by mouth 2 (two) times daily with a meal.     ferrous  gluconate 324 MG tablet  Commonly known as:  FERGON  Take 1 tablet (324 mg total) by mouth 2 (two) times daily with a meal.     levothyroxine 100 MCG tablet  Commonly known as:  SYNTHROID, LEVOTHROID  TAKE ONE TABLET BY MOUTH ONCE DAILY 30 MINUTES BEFORE BREAKFAST FOR THYROID     lisinopril 2.5 MG tablet  Commonly known as:  PRINIVIL,ZESTRIL  Take 1 tablet (2.5 mg total) by mouth 2 (two) times daily.     mirtazapine 7.5 MG tablet  Commonly known as:  REMERON  TAKE ONE TABLET BY MOUTH AT BEDTIME     traZODone 150 MG tablet  Commonly known as:  DESYREL  TAKE ONE TABLET BY MOUTH ONCE DAILY AT BEDTIME AS NEEDED FOR SLEEP     traZODone 150 MG tablet  Commonly known as:  DESYREL  TAKE ONE TABLET BY MOUTH ONCE  DAILY AT BEDTIME AS NEEDED FOR SLEEP        Health Maintenance  Topic Date Due  . COLONOSCOPY  11/08/2013  . MAMMOGRAM  12/16/2014  . INFLUENZA VACCINE  04/18/2016 (Originally 03/05/2015)  . TETANUS/TDAP  11/01/2024 (Originally 11/09/1982)  . PAP SMEAR  11/25/2015  . Hepatitis C Screening  Completed  . HIV Screening  Completed    Physical Exam: Filed Vitals:   07/20/15 0939  BP: 132/80  Pulse: 77  Temp: 97.5 F (36.4 C)  TempSrc: Oral  Resp: 20  Height: 5\' 4"  (1.626 m)  Weight: 138 lb 3.2 oz (62.687 kg)  SpO2: 97%   Body mass index is 23.71 kg/(m^2). Physical Exam  Constitutional: She is oriented to person, place, and time. She appears well-developed and well-nourished. No distress.  Cardiovascular: Normal rate, regular rhythm, normal heart sounds and intact distal pulses.   Pulmonary/Chest: Effort normal and breath sounds normal. No respiratory distress.  Abdominal: Soft. Bowel sounds are normal.  Musculoskeletal: Normal range of motion.  Neurological: She is alert and oriented to person, place, and time.  Skin: Skin is warm and dry.  Psychiatric:  Affect still somewhat flat but much improved from initial visits    Labs reviewed: Basic Metabolic Panel:  Recent Labs  11/02/14 1036  01/30/15 0846 03/07/15 0943 05/24/15 1023 07/16/15 0911  NA 140  --   --   --  137 141  K 4.2  --   --   --  3.7 4.1  CL 105  --   --   --  104 105  CO2 23  --   --   --  23 23  GLUCOSE 94  --   --   --  98 96  BUN 7  --   --   --  9 15  CREATININE 0.64  --   --   --  0.69 0.65  CALCIUM 9.3  --   --   --  9.0 9.3  TSH 10.610*  < > 5.830* 4.590*  --  5.270*  < > = values in this interval not displayed. Liver Function Tests:  Recent Labs  11/02/14 1036 07/16/15 0911  AST 19 19  ALT 7 11  ALKPHOS 66 64  BILITOT <0.2 <0.2  PROT 7.7 7.2  ALBUMIN 3.7 3.3*   No results for input(s): LIPASE, AMYLASE in the last 8760 hours. No results for input(s): AMMONIA in the last 8760  hours. CBC:  Recent Labs  11/02/14 1036 07/16/15 0911  WBC 4.8 4.7  NEUTROABS 3.0 3.2  HGB 12.0  --  HCT 37.1 38.5  MCV 87  --   PLT 241  --    Lipid Panel:  Recent Labs  11/02/14 1036 07/16/15 0911  CHOL 171 179  HDL 52 59  LDLCALC 101* 100*  TRIG 90 98  CHOLHDL 3.3 3.0   Lab Results  Component Value Date   HGBA1C 5.7* 11/02/2014   Assessment/Plan 1. Hypothyroidism, unspecified hypothyroidism type -TSH remains around 5--increase by 12 mcg and recheck at next visit - levothyroxine (SYNTHROID, LEVOTHROID) 112 MCG tablet; Take 1 tablet (112 mcg total) by mouth daily before breakfast.  Dispense: 30 tablet; Refill: 3  2. Depression - seems to be much improved -appetite is still somewhat poor, but weight has gone up 3 lbs -cont three drug regimen  3. Essential hypertension -bp is well controlled with lisinopril 2.5mg   4. Hyperlipidemia -lipids at goal with LDL 100 considering she did not have CAD on cath  5. Iron deficiency anemia due to chronic blood loss -resolved per labs this time, but is on iron daily with breakfast  6.  Cyst of brain:  I still suspect her balance problems are related to this pressing on the cerebellum, but it was felt to be due to nystagmus and astigmatism when seen at neurology -still has this off and on -she opted not to see neuroophtho  Labs/tests ordered:  Will recheck tsh at next appt Next appt:  11/19/2015 for med mgt  Neria Procter L. Santosha Jividen, D.O. [facility] [address], [place] [address] Cell Phone (Mon-Fri 8am-5pm):  [phone] On Call:  [phone] & follow prompts after 5pm & weekends Office Phone:  [phone] Office Fax:  [phone]

## 2015-08-15 ENCOUNTER — Other Ambulatory Visit: Payer: Self-pay | Admitting: Internal Medicine

## 2015-09-13 ENCOUNTER — Other Ambulatory Visit: Payer: Self-pay | Admitting: Internal Medicine

## 2015-10-10 ENCOUNTER — Other Ambulatory Visit: Payer: Self-pay | Admitting: Internal Medicine

## 2015-10-11 ENCOUNTER — Other Ambulatory Visit: Payer: Self-pay

## 2015-10-11 MED ORDER — MIRTAZAPINE 7.5 MG PO TABS: 7.5000 mg | ORAL_TABLET | Freq: Every day | ORAL | Status: DC

## 2015-11-10 ENCOUNTER — Other Ambulatory Visit: Payer: Self-pay | Admitting: Internal Medicine

## 2015-11-12 ENCOUNTER — Other Ambulatory Visit: Payer: Self-pay | Admitting: Internal Medicine

## 2015-11-13 ENCOUNTER — Other Ambulatory Visit: Payer: Self-pay | Admitting: Internal Medicine

## 2015-11-19 ENCOUNTER — Encounter: Payer: 59 | Admitting: Internal Medicine

## 2015-12-11 ENCOUNTER — Other Ambulatory Visit: Payer: Self-pay | Admitting: Internal Medicine

## 2016-01-09 ENCOUNTER — Other Ambulatory Visit: Payer: Self-pay | Admitting: Internal Medicine

## 2016-01-25 ENCOUNTER — Encounter: Payer: Self-pay | Admitting: Internal Medicine

## 2016-01-25 ENCOUNTER — Ambulatory Visit (INDEPENDENT_AMBULATORY_CARE_PROVIDER_SITE_OTHER): Payer: PPO | Admitting: Internal Medicine

## 2016-01-25 VITALS — BP 160/80 | HR 70 | Temp 97.9°F | Ht 64.0 in | Wt 144.0 lb

## 2016-01-25 DIAGNOSIS — I10 Essential (primary) hypertension: Secondary | ICD-10-CM | POA: Diagnosis not present

## 2016-01-25 DIAGNOSIS — Z0000 Encounter for general adult medical examination without abnormal findings: Secondary | ICD-10-CM | POA: Diagnosis not present

## 2016-01-25 DIAGNOSIS — D500 Iron deficiency anemia secondary to blood loss (chronic): Secondary | ICD-10-CM

## 2016-01-25 DIAGNOSIS — Z1211 Encounter for screening for malignant neoplasm of colon: Secondary | ICD-10-CM

## 2016-01-25 DIAGNOSIS — Z01419 Encounter for gynecological examination (general) (routine) without abnormal findings: Secondary | ICD-10-CM

## 2016-01-25 DIAGNOSIS — D259 Leiomyoma of uterus, unspecified: Secondary | ICD-10-CM | POA: Diagnosis not present

## 2016-01-25 DIAGNOSIS — Z780 Asymptomatic menopausal state: Secondary | ICD-10-CM | POA: Diagnosis not present

## 2016-01-25 DIAGNOSIS — E039 Hypothyroidism, unspecified: Secondary | ICD-10-CM | POA: Diagnosis not present

## 2016-01-25 DIAGNOSIS — Z23 Encounter for immunization: Secondary | ICD-10-CM

## 2016-01-25 DIAGNOSIS — E785 Hyperlipidemia, unspecified: Secondary | ICD-10-CM

## 2016-01-25 DIAGNOSIS — F329 Major depressive disorder, single episode, unspecified: Secondary | ICD-10-CM | POA: Diagnosis not present

## 2016-01-25 DIAGNOSIS — I5042 Chronic combined systolic (congestive) and diastolic (congestive) heart failure: Secondary | ICD-10-CM

## 2016-01-25 DIAGNOSIS — F32A Depression, unspecified: Secondary | ICD-10-CM

## 2016-01-25 DIAGNOSIS — Z1231 Encounter for screening mammogram for malignant neoplasm of breast: Secondary | ICD-10-CM

## 2016-01-25 DIAGNOSIS — E2839 Other primary ovarian failure: Secondary | ICD-10-CM

## 2016-01-25 MED ORDER — CARVEDILOL 6.25 MG PO TABS: 6.2500 mg | ORAL_TABLET | Freq: Two times a day (BID) | ORAL | Status: DC

## 2016-01-25 MED ORDER — LISINOPRIL 2.5 MG PO TABS: 2.5000 mg | ORAL_TABLET | Freq: Two times a day (BID) | ORAL | Status: DC

## 2016-01-25 MED ORDER — MIRTAZAPINE 7.5 MG PO TABS: 7.5000 mg | ORAL_TABLET | Freq: Every day | ORAL | Status: DC

## 2016-01-25 MED ORDER — ATORVASTATIN CALCIUM 10 MG PO TABS: ORAL_TABLET | ORAL | Status: DC

## 2016-01-25 MED ORDER — FERROUS FUMARATE 324 (106 FE) MG PO TABS: 1.0000 | ORAL_TABLET | Freq: Two times a day (BID) | ORAL | Status: DC

## 2016-01-25 MED ORDER — BUPROPION HCL ER (XL) 150 MG PO TB24: 150.0000 mg | ORAL_TABLET | Freq: Every day | ORAL | Status: DC

## 2016-01-25 NOTE — Progress Notes (Signed)
Location:  [facility] [place] clinic Provider: Nereyda Bowler L. Mariea Clonts, D.O., C.M.D.  Patient Care Team: Gayland Curry, DO as PCP - General (Geriatric Medicine) Emily Filbert, MD as Consulting Physician (Obstetrics and Gynecology) Jolaine Artist, MD as Consulting Physician (Cardiology) Penni Bombard, MD as Consulting Physician (Neurology)  Extended Emergency Contact Information Primary Emergency Contact: [place] Phone: [phone] Relation: Father  Code Status: full code Goals of Care: Advanced Directive information Advanced Directives 07/20/2015  Does patient have an advance directive? No  Would patient like information on creating an advanced directive? Yes - Geneticist, molecular Complaint  Patient presents with  . Annual Exam    CPE    HPI: Patient is a 50 y.o. female seen in today for an annual physical.    Sinuses draining a lot lately.    Depression screen [facility] 2/9 01/25/2016 04/19/2015 11/02/2014 05/19/2013  Decreased Interest 0 0 0 2  Down, Depressed, Hopeless 0 0 1 2  PHQ - 2 Score 0 0 1 4    Fall Risk  01/25/2016 07/20/2015 04/19/2015 01/15/2015 12/14/2014  Falls in the past year? No No No No No    Health Maintenance  Topic Date Due  . COLONOSCOPY  11/08/2013  . MAMMOGRAM  12/16/2014  . PAP SMEAR  11/25/2015  . INFLUENZA VACCINE  04/18/2016 (Originally 03/04/2016)  . TETANUS/TDAP  11/01/2024 (Originally 11/09/1982)  . Hepatitis C Screening  Completed  . HIV Screening  Completed  Exercise?  Tries to exercise, but very sensitive to heat, sweat a lot.  Anywhere, anytime, gets hot real easily. Diet? Low sodium diet, and tries to be good about other things she knows are not good for her. Vision:  No vision changes.  Has been a while since she's been to ophtho--[place] [employer] [place]  Does not have insurance to go. Hearing:  No problem. Dentition:  No problems.  Has not been due to lack of dental  insurance. Pain:  No pain.    Past Medical History  Diagnosis Date  . Hypertension   . At risk for sudden cardiac death Nov 18, 2012  . CHF - Combined Systolic + Diastolic Dysfunction. EF of 15-20% with Grade II diastolic dysfunction on echo 10/27/12 10/28/2012  . S/P cardiac catheterization, 11-18-12, normal coronaries with minimal luminal irregularities in RCA system 10/30/2012  . Thyroid disease     hypothyroidism  . Anemia   . Fibroids     Past Surgical History  Procedure Laterality Date  . Left and right heart catheterization with coronary angiogram N/A 11/18/12    Procedure: LEFT AND RIGHT HEART CATHETERIZATION WITH CORONARY ANGIOGRAM;  Surgeon: Leonie Man, MD;  Location: [facility] CATH LAB;  Service: Cardiovascular;  Laterality: N/A;    Social History   Social History  . Marital Status: Single    Spouse Name: N/A  . Number of Children: 1  . Years of Education: 12th   Occupational History  . 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 Smoking status: Former Smoker -- 5 years    Types: Cigars    Quit date: 08/04/1978  . Smokeless tobacco: Never Used     Comment: 1 cigar daily-quit 10/24/12    . Alcohol Use: No     Comment: quit 09/19/11  . Drug Use: No     Comment: Previously used marijuana; quit 10/25/12  . Sexual Activity: Not Currently   Other Topics  Concern  . Not on file   Social History Narrative   Patient lives at home alone. (Condo)   Caffeine use: 1/2 soda daily   Diet   Martial status:single   Is it one or more stories? Yes   How many persons live in your home? 1   Do you have any pets in your home? No   Current or past profession: Biochemist, clinical   Do you exercise? No   Do you have a living will? No    Do you have a DNR form? No    Do you have signed POA/HPOA forms? No        Allergies  Allergen Reactions  . Codeine Hives      Medication List       This list is accurate as of: 01/25/16  9:14 AM.  Always use your most  recent med list.               acetaminophen 500 MG tablet  Commonly known as:  TYLENOL  Take 500 mg by mouth as needed.     aspirin 81 MG EC tablet  Take 1 tablet (81 mg total) by mouth daily.     atorvastatin 10 MG tablet  Commonly known as:  LIPITOR  TAKE ONE TABLET BY MOUTH ONCE DAILY AT 6 PM     buPROPion 150 MG 24 hr tablet  Commonly known as:  WELLBUTRIN XL  TAKE ONE TABLET BY MOUTH ONCE DAILY     carvedilol 6.25 MG tablet  Commonly known as:  COREG  Take 1 tablet (6.25 mg total) by mouth 2 (two) times daily with a meal.     Ferrous Fumarate 324 (106 Fe) MG Tabs tablet  Commonly known as:  HEMOCYTE - 106 mg FE  Take 1 tablet by mouth 2 (two) times daily.     levothyroxine 112 MCG tablet  Commonly known as:  SYNTHROID, LEVOTHROID  TAKE ONE TABLET BY MOUTH ONCE DAILY BEFORE BREAKFAST     lisinopril 2.5 MG tablet  Commonly known as:  PRINIVIL,ZESTRIL  Take 1 tablet (2.5 mg total) by mouth 2 (two) times daily.     mirtazapine 7.5 MG tablet  Commonly known as:  REMERON  TAKE 1 TABLET BY MOUTH AT BEDTIME     traZODone 150 MG tablet  Commonly known as:  DESYREL  TAKE ONE TABLET BY MOUTH AT BEDTIME AS NEEDED FOR SLEEP       Review of Systems:  Review of Systems  Constitutional: Positive for diaphoresis. Negative for fever, chills, weight loss and malaise/fatigue.  HENT: Negative for hearing loss.   Eyes:       No visual changes, glasses, chronic nystagmus  Respiratory: Negative for cough, shortness of breath and stridor.   Cardiovascular: Negative for chest pain, palpitations and leg swelling.  Gastrointestinal: Negative for abdominal pain, diarrhea, constipation, blood in stool and melena.  Genitourinary: Negative for dysuria, urgency and frequency.       No vaginal bleeding, no longer getting periods  Musculoskeletal: Negative for myalgias, back pain, joint pain, falls and neck pain.  Skin: Negative for rash.  Neurological: Positive for dizziness. Negative  for tingling, sensory change, focal weakness, seizures, loss of consciousness, weakness and headaches.  Endo/Heme/Allergies: Does not bruise/bleed easily.  Psychiatric/Behavioral: Positive for depression. Negative for hallucinations and memory loss. The patient does not have insomnia.        Sleeps if she takes her medicine, otherwise does not, mood a lot better than  it used to be, no psychosis    Physical Exam: Filed Vitals:   01/25/16 0900  BP: 160/80  Pulse: 70  Temp: 97.9 F (36.6 C)  TempSrc: Oral  Height: 5\' 4"  (1.626 m)  Weight: 144 lb (65.318 kg)  SpO2: 98%   Body mass index is 24.71 kg/(m^2). Physical Exam  Constitutional: She is oriented to person, place, and time. She appears well-developed and well-nourished.  HENT:  Head: Normocephalic and atraumatic.  Right Ear: External ear normal.  Left Ear: External ear normal.  Nose: Nose normal.  Mouth/Throat: Oropharynx is clear and moist. No oropharyngeal exudate.  Eyes: Conjunctivae and EOM are normal. Pupils are equal, round, and reactive to light.  Chronic horizontal nystagmus, glasses  Neck: Normal range of motion. Neck supple. No JVD present. No tracheal deviation present. No thyromegaly present.  Cardiovascular: Normal rate, regular rhythm, normal heart sounds and intact distal pulses.   Pulmonary/Chest: Effort normal and breath sounds normal.  Abdominal: Soft. Bowel sounds are normal. She exhibits no distension and no mass. There is no tenderness. There is no rebound and no guarding. Hernia confirmed negative in the right inguinal area and confirmed negative in the left inguinal area.  Genitourinary: Vagina normal and uterus normal. Rectal exam shows no external hemorrhoid, no internal hemorrhoid, no mass and anal tone normal. Guaiac negative stool. Pelvic exam was performed with patient supine. There is no rash, tenderness, lesion or injury on the right labia. There is no rash, tenderness, lesion or injury on the left  labia. Uterus is not tender. Cervix exhibits no discharge. Right adnexum displays no mass. Left adnexum displays no mass. No vaginal discharge found.  Musculoskeletal: Normal range of motion. She exhibits no edema or tenderness.  Lymphadenopathy:    She has no cervical adenopathy.       Right: No inguinal adenopathy present.       Left: No inguinal adenopathy present.  Neurological: She is alert and oriented to person, place, and time. Coordination normal.  DTRs 3+  Skin: Skin is warm and dry.  Psychiatric:  Flat affect    Labs reviewed: Basic Metabolic Panel:  Recent Labs  01/30/15 0846 03/07/15 0943 05/24/15 1023 07/16/15 0911  NA  --   --  137 141  K  --   --  3.7 4.1  CL  --   --  104 105  CO2  --   --  23 23  GLUCOSE  --   --  98 96  BUN  --   --  9 15  CREATININE  --   --  0.69 0.65  CALCIUM  --   --  9.0 9.3  TSH 5.830* 4.590*  --  5.270*   Liver Function Tests:  Recent Labs  07/16/15 0911  AST 19  ALT 11  ALKPHOS 64  BILITOT <0.2  PROT 7.2  ALBUMIN 3.3*   No results for input(s): LIPASE, AMYLASE in the last 8760 hours. No results for input(s): AMMONIA in the last 8760 hours. CBC:  Recent Labs  07/16/15 0911  WBC 4.7  NEUTROABS 3.2  HCT 38.5  MCV 84  PLT 212   Lipid Panel:  Recent Labs  07/16/15 0911  CHOL 179  HDL 59  LDLCALC 100*  TRIG 98  CHOLHDL 3.0   Lab Results  Component Value Date   HGBA1C 5.7* 11/02/2014    Assessment/Plan 1. Chronic combined systolic and diastolic congestive heart failure ([place]) - has been doing great from this perspective -  EF improved - cont same medications - lisinopril (PRINIVIL,ZESTRIL) 2.5 MG tablet; Take 1 tablet (2.5 mg total) by mouth 2 (two) times daily.  Dispense: 180 tablet; Refill: 3 - carvedilol (COREG) 6.25 MG tablet; Take 1 tablet (6.25 mg total) by mouth 2 (two) times daily with a meal.  Dispense: 180 tablet; Refill: 3 - atorvastatin (LIPITOR) 10 MG tablet; TAKE ONE TABLET BY MOUTH ONCE  DAILY AT 6 PM  Dispense: 90 tablet; Refill: 3  2. Depression -stable, improved from when she was first seen and no longer having any hallucinations like she once did, more motivated and sleeping better, weight up to 144 from 138 6 mos ago - buPROPion (WELLBUTRIN XL) 150 MG 24 hr tablet; Take 1 tablet (150 mg total) by mouth daily.  Dispense: 30 tablet; Refill: 5 - mirtazapine (REMERON) 7.5 MG tablet; Take 1 tablet (7.5 mg total) by mouth at bedtime.  Dispense: 90 tablet; Refill: 3  3. Hypothyroidism, unspecified hypothyroidism type - having periods of sudden warmth when exposed to warm air/stove,etc -may be hot flashes as recently stopped her menses or over replacement of thyroid - TSH  4. Essential hypertension -BP recheck  - Comprehensive metabolic panel  5. Hyperlipidemia - continues on lipitor, f/u lab - Lipid panel  6. Iron deficiency anemia due to chronic blood loss - continue son iron f/u cbc - Ferrous Fumarate (HEMOCYTE - 106 MG FE) 324 (106 Fe) MG TABS tablet; Take 1 tablet (106 mg of iron total) by mouth 2 (two) times daily.  Dispense: 180 tablet; Refill: 3 - CBC with Differential/Platelet  7. Uterine leiomyoma, unspecified location - now w/o recent periods or spotting - f/u labs, exam unremarkable - CBC with Differential/Platelet  8. Need for diphtheria-tetanus-pertussis (Tdap) vaccine, adult/adolescent -tdap given  9. Colon cancer screening -cologuard paperwork started for screening as she refused traditional colonoscopy and does not have personal or family history of colon cancer or polyps, no longer smokes  10. Encounter for screening mammogram for breast cancer -mammogram ordered at breast center  11. Postmenopausal -needs bone density with her thyroid disease and no longer having periods  12. Estrogen deficiency -needs bone density  Well-female exam: Pap/pelvic done Mammogram and bone density ordered Cologuard paperwork started tdap given All meds put  in to be filled when she calls with 90d supply except wellbutrin and synthroid--may be changing the synthroid depending on results  Labs/tests ordered:   Orders Placed This Encounter  Procedures  . CBC with Differential/Platelet  . Comprehensive metabolic panel    Order Specific Question:  Has the patient fasted?    Answer:  Yes  . Lipid panel    Order Specific Question:  Has the patient fasted?    Answer:  Yes  . TSH    Next appt:  05/26/2016 [facility] [place]. Brit Wernette, D.O. [facility] [address], [place] [address] Cell Phone (Mon-Fri 8am-5pm):  [phone] On Call:  [phone] & follow prompts after 5pm & weekends Office Phone:  [phone] Office Fax:  [phone]

## 2016-01-26 LAB — CBC WITH DIFFERENTIAL/PLATELET
Basophils Absolute: 0 10*3/uL (ref 0.0–0.2)
Basos: 0 %
EOS (ABSOLUTE): 0.1 10*3/uL (ref 0.0–0.4)
Eos: 3 %
Hematocrit: 40.1 % (ref 34.0–46.6)
Hemoglobin: 12.9 g/dL (ref 11.1–15.9)
Immature Grans (Abs): 0 10*3/uL (ref 0.0–0.1)
Immature Granulocytes: 0 %
Lymphocytes Absolute: 1.2 10*3/uL (ref 0.7–3.1)
Lymphs: 26 %
MCH: 27.5 pg (ref 26.6–33.0)
MCHC: 32.2 g/dL (ref 31.5–35.7)
MCV: 86 fL (ref 79–97)
Monocytes Absolute: 0.5 10*3/uL (ref 0.1–0.9)
Monocytes: 11 %
Neutrophils Absolute: 2.7 10*3/uL (ref 1.4–7.0)
Neutrophils: 60 %
Platelets: 240 10*3/uL (ref 150–379)
RBC: 4.69 x10E6/uL (ref 3.77–5.28)
RDW: 15.2 % (ref 12.3–15.4)
WBC: 4.6 10*3/uL (ref 3.4–10.8)

## 2016-01-26 LAB — LIPID PANEL
Chol/HDL Ratio: 3.4 ratio units (ref 0.0–4.4)
Cholesterol, Total: 169 mg/dL (ref 100–199)
HDL: 49 mg/dL (ref >39)
LDL Calculated: 89 mg/dL (ref 0–99)
Triglycerides: 154 mg/dL — ABNORMAL HIGH (ref 0–149)
VLDL Cholesterol Cal: 31 mg/dL (ref 5–40)

## 2016-01-26 LAB — COMPREHENSIVE METABOLIC PANEL
ALT: 18 IU/L (ref 0–32)
AST: 29 IU/L (ref 0–40)
Albumin/Globulin Ratio: 0.9 — ABNORMAL LOW (ref 1.2–2.2)
Albumin: 3.7 g/dL (ref 3.5–5.5)
Alkaline Phosphatase: 77 IU/L (ref 39–117)
BUN/Creatinine Ratio: 16 (ref 9–23)
BUN: 11 mg/dL (ref 6–24)
Bilirubin Total: <0.2 mg/dL (ref 0.0–1.2)
CO2: 22 mmol/L (ref 18–29)
Calcium: 9.4 mg/dL (ref 8.7–10.2)
Chloride: 104 mmol/L (ref 96–106)
Creatinine, Ser: 0.68 mg/dL (ref 0.57–1.00)
GFR calc Af Amer: 116 mL/min/{1.73_m2} (ref >59)
GFR calc non Af Amer: 101 mL/min/{1.73_m2} (ref >59)
Globulin, Total: 3.9 g/dL (ref 1.5–4.5)
Glucose: 86 mg/dL (ref 65–99)
Potassium: 4.3 mmol/L (ref 3.5–5.2)
Sodium: 141 mmol/L (ref 134–144)
Total Protein: 7.6 g/dL (ref 6.0–8.5)

## 2016-01-26 LAB — TSH: TSH: 4.15 u[IU]/mL (ref 0.450–4.500)

## 2016-01-28 ENCOUNTER — Encounter: Payer: Self-pay | Admitting: *Deleted

## 2016-01-29 LAB — PAP IG (IMAGE GUIDED): PAP Smear Comment: 0

## 2016-01-31 ENCOUNTER — Encounter: Payer: Self-pay | Admitting: *Deleted

## 2016-02-11 ENCOUNTER — Other Ambulatory Visit: Payer: Self-pay | Admitting: Internal Medicine

## 2016-02-14 DIAGNOSIS — Z1211 Encounter for screening for malignant neoplasm of colon: Secondary | ICD-10-CM | POA: Diagnosis not present

## 2016-02-14 DIAGNOSIS — Z1212 Encounter for screening for malignant neoplasm of rectum: Secondary | ICD-10-CM | POA: Diagnosis not present

## 2016-02-14 LAB — COLOGUARD: Cologuard: NEGATIVE

## 2016-02-22 ENCOUNTER — Encounter: Payer: Self-pay | Admitting: *Deleted

## 2016-02-27 ENCOUNTER — Ambulatory Visit
Admission: RE | Admit: 2016-02-27 | Discharge: 2016-02-27 | Disposition: A | Discharge status: Home / Self Care | Payer: PPO | Source: Ambulatory Visit | Attending: Internal Medicine | Admitting: Internal Medicine

## 2016-02-27 ENCOUNTER — Encounter: Payer: Self-pay | Admitting: *Deleted

## 2016-02-27 DIAGNOSIS — E2839 Other primary ovarian failure: Secondary | ICD-10-CM

## 2016-02-27 DIAGNOSIS — Z1231 Encounter for screening mammogram for malignant neoplasm of breast: Secondary | ICD-10-CM

## 2016-02-27 DIAGNOSIS — Z780 Asymptomatic menopausal state: Secondary | ICD-10-CM | POA: Diagnosis not present

## 2016-02-27 DIAGNOSIS — Z13820 Encounter for screening for osteoporosis: Secondary | ICD-10-CM | POA: Diagnosis not present

## 2016-02-27 DIAGNOSIS — Z01419 Encounter for gynecological examination (general) (routine) without abnormal findings: Secondary | ICD-10-CM

## 2016-03-11 ENCOUNTER — Other Ambulatory Visit: Payer: Self-pay | Admitting: Internal Medicine

## 2016-04-14 ENCOUNTER — Other Ambulatory Visit: Payer: Self-pay | Admitting: Internal Medicine

## 2016-04-28 ENCOUNTER — Ambulatory Visit (INDEPENDENT_AMBULATORY_CARE_PROVIDER_SITE_OTHER): Payer: PPO | Admitting: Cardiology

## 2016-04-28 ENCOUNTER — Encounter: Payer: Self-pay | Admitting: Cardiology

## 2016-04-28 VITALS — BP 116/82 | HR 78 | Ht 64.5 in | Wt 144.2 lb

## 2016-04-28 DIAGNOSIS — Z8679 Personal history of other diseases of the circulatory system: Secondary | ICD-10-CM | POA: Diagnosis not present

## 2016-04-28 DIAGNOSIS — E785 Hyperlipidemia, unspecified: Secondary | ICD-10-CM

## 2016-04-28 DIAGNOSIS — I10 Essential (primary) hypertension: Secondary | ICD-10-CM | POA: Diagnosis not present

## 2016-04-28 DIAGNOSIS — Z9889 Other specified postprocedural states: Secondary | ICD-10-CM

## 2016-04-28 DIAGNOSIS — Z79899 Other long term (current) drug therapy: Secondary | ICD-10-CM

## 2016-04-28 NOTE — Progress Notes (Signed)
Cardiology Office Note   Date:  04/28/2016   ID:  Miranda [name], DOB 02-Jan-1966, MRN [id number]  PCP:  Hollace Kinnier, DO  Cardiologist:   Candee Furbish, MD     History of Present Illness: Miranda [name] is a 50 y.o. female here for chronic systolic heart failure, cardiomyopathy follow-up, nonischemic cardiomyopathy. Previous ejection fraction was 15%. At last check, EF has improved to 50% in March 2015. Asymptomatic. Doing very well. ACE inhibitor and carvedilol. She has stopped taking Lasix. She is still on potassium supplementation however. We will discontinue. No syncope. Her EKG at baseline is abnormal with T-wave inversion in multiple leads. This is no change from prior. She does not have LVH on echo. No high-risk symptoms such as syncope. Once again, cardiac catheterization was normal with no coronary artery disease.  Her primary physician Dr. Mariea Clonts, asked for her to be seen because of her prior cardiomyopathy. She used to see Dr. Haroldine Laws.  Sometimes wakes up at night with SOB, turns on Peck feels better.  She has been having some hot flashes periodically. She is taking soy products. Black cohosh was not recommended because of increased potential for bleeding especially with her aspirin.    Past Medical History:  Diagnosis Date  . Anemia   . At risk for sudden cardiac death 11-06-12  . CHF - Combined Systolic + Diastolic Dysfunction. EF of 15-20% with Grade II diastolic dysfunction on echo 10/27/12 10/28/2012  . Fibroids   . Hypertension   . S/P cardiac catheterization, 11/06/12, normal coronaries with minimal luminal irregularities in RCA system 10/30/2012  . Thyroid disease    hypothyroidism    Past Surgical History:  Procedure Laterality Date  . LEFT AND RIGHT HEART CATHETERIZATION WITH CORONARY ANGIOGRAM N/A 11/06/12   Procedure: LEFT AND RIGHT HEART CATHETERIZATION WITH CORONARY ANGIOGRAM;  Surgeon: Leonie Man, MD;  Location: [facility] CATH LAB;  Service: Cardiovascular;   Laterality: N/A;     Current Outpatient Prescriptions  Medication Sig Dispense Refill  . acetaminophen (TYLENOL) 500 MG tablet Take 500 mg by mouth as needed.    Marland Kitchen aspirin EC 81 MG EC tablet Take 1 tablet (81 mg total) by mouth daily.    Marland Kitchen atorvastatin (LIPITOR) 10 MG tablet TAKE ONE TABLET BY MOUTH ONCE DAILY AT 6 PM 90 tablet 3  . buPROPion (WELLBUTRIN XL) 150 MG 24 hr tablet Take 1 tablet (150 mg total) by mouth daily. 30 tablet 5  . carvedilol (COREG) 6.25 MG tablet Take 1 tablet (6.25 mg total) by mouth 2 (two) times daily with a meal. 180 tablet 3  . Ferrous Fumarate (HEMOCYTE - 106 MG FE) 324 (106 Fe) MG TABS tablet Take 1 tablet (106 mg of iron total) by mouth 2 (two) times daily. 180 tablet 3  . levothyroxine (SYNTHROID, LEVOTHROID) 112 MCG tablet TAKE ONE TABLET BY MOUTH ONCE DAILY BEFORE BREAKFAST 30 tablet 4  . lisinopril (PRINIVIL,ZESTRIL) 2.5 MG tablet Take 1 tablet (2.5 mg total) by mouth 2 (two) times daily. 180 tablet 3  . mirtazapine (REMERON) 7.5 MG tablet Take 1 tablet (7.5 mg total) by mouth at bedtime. 90 tablet 3  . traZODone (DESYREL) 150 MG tablet TAKE ONE TABLET BY MOUTH AT BEDTIME FOR SLEEP 30 tablet 0   No current facility-administered medications for this visit.     Allergies:   Codeine    Social History:  The patient  reports that she quit smoking about 37 years ago. Her smoking use included Cigars. She  quit after 5.00 years of use. She has never used smokeless tobacco. She reports that she does not drink alcohol or use drugs.   Family History:  The patient's family history includes Arthritis in her paternal uncle; Autoimmune disease in her mother; Breast cancer in her cousin; Cancer in her maternal uncle and paternal grandfather; Hypertension in her father, maternal grandmother, and mother; Stroke in her father.    ROS:  Please see the history of present illness.   Otherwise, review of systems are positive for depression, sweating..   All other systems are  reviewed and negative.    PHYSICAL EXAM: VS:  BP 116/82   Pulse 78   Ht 5' 4.5" (1.638 m)   Wt 144 lb 3.2 oz (65.4 kg)   LMP 01/17/2015   BMI 24.37 kg/m  , BMI Body mass index is 24.37 kg/m. GEN: Well nourished, well developed, in no acute distress  HEENT: normal  Neck: no JVD, carotid bruits, or masses Cardiac: RRR; no murmurs, rubs, or gallops,no edema  Respiratory:  clear to auscultation bilaterally, normal work of breathing GI: soft, nontender, nondistended, + BS MS: no deformity or atrophy  Skin: warm and dry, no rash Neuro:  Strength and sensation are intact Psych: euthymic mood, full affect   EKG:  Today 04/28/16-heart rate 72 bpm, sinus rhythm, T-wave inferior laterally inversion, LVH. Personally viewed-no significant change from prior 04/24/15-sinus rhythm, 78, T wave inversion in multiple leads, no change from prior. QT interval 408 ms. Personally viewed.  ECHO: 10/19/13-ejection fraction 50%, increased from prior of 15%.   Recent Labs: 01/25/2016: ALT 18; BUN 11; Creatinine, Ser 0.68; Platelets 240; Potassium 4.3; Sodium 141; TSH 4.150    Lipid Panel    Component Value Date/Time   CHOL 169 01/25/2016 1019   TRIG 154 (H) 01/25/2016 1019   HDL 49 01/25/2016 1019   CHOLHDL 3.4 01/25/2016 1019   CHOLHDL 4.0 11/11/2013 0916   VLDL 17 11/11/2013 0916   LDLCALC 89 01/25/2016 1019      Wt Readings from Last 3 Encounters:  04/28/16 144 lb 3.2 oz (65.4 kg)  01/25/16 144 lb (65.3 kg)  07/20/15 138 lb 3.2 oz (62.7 kg)      Other studies Reviewed: Additional studies/ records that were reviewed today include: Labs, office notes, echo, catheterization, EKG. Review of the above records demonstrates: As above   ASSESSMENT AND PLAN:  1.  Prior cardiomyopathy, nonischemic with prior systolic heart failure. Ejection fraction has been restored to normal, 50%. She is feeling well with no symptoms, no orthopnea, no PND, no shortness of breath with exertion, no syncope or  other high risk symptoms. No CAD on catheterization.  - Continue with carvedilol 6.25 mg twice a day  - Continue with lisinopril 2.5 mg twice a day  - She is no longer taking Lasix. We will stop her supplemental potassium.  2. Abnormal EKG  - This is her baseline. Remember, no CAD. Her ejection fraction now is normal.  3. Hyperlipidemia  - Atorvastatin 10 mg.  4. Essential hypertension  - Blood pressure  normal. Continue with current regimen.  Discussed with her the possibility of sleep study given that she wakes up sometimes in the middle of the night feeling short of breath and needs to put a fan on for her to be comfortable. She states that she does not think this is a nightmare. She is not having any orthopnea otherwise. She declined pursuing further sleep study.  Current medicines are reviewed at  length with the patient today.  The patient does not have concerns regarding medicines.  The following changes have been made:    Labs/ tests ordered today include: none  Orders Placed This Encounter  Procedures  . EKG 12-Lead     Disposition:   FU with Jareth Pardee in 1 year  Signed, Candee Furbish, MD  04/28/2016 9:24 AM    [facility] [place], [place], [place]  [address] Phone: [phone]; Fax: [phone]

## 2016-04-28 NOTE — Patient Instructions (Signed)

## 2016-05-14 ENCOUNTER — Other Ambulatory Visit: Payer: Self-pay | Admitting: Internal Medicine

## 2016-05-26 ENCOUNTER — Ambulatory Visit (INDEPENDENT_AMBULATORY_CARE_PROVIDER_SITE_OTHER): Payer: PPO | Admitting: Internal Medicine

## 2016-05-26 ENCOUNTER — Encounter: Payer: Self-pay | Admitting: Internal Medicine

## 2016-05-26 VITALS — BP 138/80 | HR 61 | Temp 97.7°F | Ht 65.0 in | Wt 143.0 lb

## 2016-05-26 DIAGNOSIS — G930 Cerebral cysts: Secondary | ICD-10-CM

## 2016-05-26 DIAGNOSIS — R2689 Other abnormalities of gait and mobility: Secondary | ICD-10-CM | POA: Diagnosis not present

## 2016-05-26 DIAGNOSIS — D500 Iron deficiency anemia secondary to blood loss (chronic): Secondary | ICD-10-CM

## 2016-05-26 DIAGNOSIS — E039 Hypothyroidism, unspecified: Secondary | ICD-10-CM

## 2016-05-26 DIAGNOSIS — Z8679 Personal history of other diseases of the circulatory system: Secondary | ICD-10-CM | POA: Diagnosis not present

## 2016-05-26 DIAGNOSIS — Z23 Encounter for immunization: Secondary | ICD-10-CM | POA: Diagnosis not present

## 2016-05-26 DIAGNOSIS — H5500 Unspecified nystagmus: Secondary | ICD-10-CM | POA: Diagnosis not present

## 2016-05-26 DIAGNOSIS — R232 Flushing: Secondary | ICD-10-CM

## 2016-05-26 MED ORDER — TRAZODONE HCL 150 MG PO TABS: 150.0000 mg | ORAL_TABLET | Freq: Every day | ORAL | 3 refills | Status: DC

## 2016-05-26 NOTE — Progress Notes (Signed)
Location:  [facility] clinic Provider:  Sarissa Dern L. Mariea Clonts, D.O., C.M.D.  Code Status: Full code Goals of Care:  Advanced Directives 07/20/2015  Does patient have an advance directive? No  Would patient like information on creating an advanced directive? Yes - Educational materials given  Pre-existing out of facility DNR order (yellow form or pink MOST form) -   Chief Complaint  Patient presents with  . Medical Management of Chronic Issues    4 mth follow-up    HPI: Patient is a 50 y.o. female seen today for medical management of chronic diseases.  Nothing new.    Saw Dr. Marlou Porch last month for her annual visit.  Appears the main thing that came out of it was that she might have some sleep apnea and a sleep study was recommended--was waking up sob and using a fan, but she declined having a sleep study.     BP ok this am.    Mood has been better.  Doing well on that front.  Sleeping well outside of above.  Energy levels ok.  Appetite ok.  Weight is stable.    No pain.    TSH was finally normal in June.  Denies missed doses of levothyroxine.    Balance is still iffy when she is walking.  Feels like she gets off balance and the right foot will stub the toe.  Then knee will hurt.  She declined further workup for nystagmus.    She is having hot flashes.  Had one in office.  Using soy isoflavones herbal which helps a little with decreasing them.    Past Medical History:  Diagnosis Date  . Anemia   . At risk for sudden cardiac death November 19, 2012  . CHF - Combined Systolic + Diastolic Dysfunction. EF of 15-20% with Grade II diastolic dysfunction on echo 10/27/12 10/28/2012  . Fibroids   . Hypertension   . S/P cardiac catheterization, 11/19/2012, normal coronaries with minimal luminal irregularities in RCA system 10/30/2012  . Thyroid disease    hypothyroidism    Past Surgical History:  Procedure Laterality Date  . LEFT AND RIGHT HEART CATHETERIZATION WITH CORONARY ANGIOGRAM N/A 11/19/2012   Procedure: LEFT AND RIGHT HEART CATHETERIZATION WITH CORONARY ANGIOGRAM;  Surgeon: Leonie Man, MD;  Location: [facility] CATH LAB;  Service: Cardiovascular;  Laterality: N/A;    Allergies  Allergen Reactions  . Codeine Hives      Medication List       Accurate as of 05/26/16 11:06 AM. Always use your most recent med list.          acetaminophen 500 MG tablet Commonly known as:  TYLENOL Take 500 mg by mouth as needed.   aspirin 81 MG EC tablet Take 1 tablet (81 mg total) by mouth daily.   atorvastatin 10 MG tablet Commonly known as:  LIPITOR TAKE ONE TABLET BY MOUTH ONCE DAILY AT 6 PM   buPROPion 150 MG 24 hr tablet Commonly known as:  WELLBUTRIN XL Take 1 tablet (150 mg total) by mouth daily.   carvedilol 6.25 MG tablet Commonly known as:  COREG Take 1 tablet (6.25 mg total) by mouth 2 (two) times daily with a meal.   Ferrous Fumarate 324 (106 Fe) MG Tabs tablet Commonly known as:  HEMOCYTE - 106 mg FE Take 1 tablet (106 mg of iron total) by mouth 2 (two) times daily.   levothyroxine 112 MCG tablet Commonly known as:  SYNTHROID, LEVOTHROID TAKE ONE TABLET BY MOUTH ONCE DAILY BEFORE  BREAKFAST   lisinopril 2.5 MG tablet Commonly known as:  PRINIVIL,ZESTRIL Take 1 tablet (2.5 mg total) by mouth 2 (two) times daily.   mirtazapine 7.5 MG tablet Commonly known as:  REMERON Take 1 tablet (7.5 mg total) by mouth at bedtime.   traZODone 150 MG tablet Commonly known as:  DESYREL TAKE ONE TABLET BY MOUTH AT BEDTIME FOR SLEEP      Review of Systems:  Review of Systems  Constitutional: Positive for diaphoresis. Negative for chills, fever, malaise/fatigue and weight loss.  HENT: Negative for congestion and hearing loss.   Eyes: Positive for double vision.  Respiratory: Negative for shortness of breath.   Cardiovascular: Negative for chest pain and palpitations.  Gastrointestinal: Negative for abdominal pain, blood in stool, constipation and melena.  Genitourinary:  Negative for dysuria, frequency and urgency.  Musculoskeletal: Negative for falls and myalgias.  Skin: Negative for itching and rash.  Neurological: Negative for dizziness, loss of consciousness, weakness and headaches.  Endo/Heme/Allergies: Does not bruise/bleed easily.  Psychiatric/Behavioral: Negative for depression, hallucinations, memory loss, substance abuse and suicidal ideas. The patient is not nervous/anxious and does not have insomnia.     Health Maintenance  Topic Date Due  . INFLUENZA VACCINE  03/04/2016  . MAMMOGRAM  02/26/2018  . PAP SMEAR  01/25/2019  . Fecal DNA (Cologuard)  02/14/2019  . TETANUS/TDAP  01/24/2026  . Hepatitis C Screening  Completed  . HIV Screening  Completed    Physical Exam: Vitals:   05/26/16 1044  BP: 138/80  Pulse: 61  Temp: 97.7 F (36.5 C)  TempSrc: Oral  SpO2: 98%  Weight: 143 lb (64.9 kg)  Height: 5\' 5"  (1.651 m)   Body mass index is 23.8 kg/m. Physical Exam  Constitutional: She is oriented to person, place, and time. She appears well-developed and well-nourished. No distress.  Eyes:  Glasses, nystagmus  Cardiovascular: Normal rate, regular rhythm, normal heart sounds and intact distal pulses.   Pulmonary/Chest: Effort normal and breath sounds normal. No respiratory distress.  Abdominal: Soft. Bowel sounds are normal.  Musculoskeletal: Normal range of motion.  Neurological: She is alert and oriented to person, place, and time.  Skin: Skin is warm and dry.    Labs reviewed: Basic Metabolic Panel:  Recent Labs  07/16/15 0911 01/25/16 1019  NA 141 141  K 4.1 4.3  CL 105 104  CO2 23 22  GLUCOSE 96 86  BUN 15 11  CREATININE 0.65 0.68  CALCIUM 9.3 9.4  TSH 5.270* 4.150   Liver Function Tests:  Recent Labs  07/16/15 0911 01/25/16 1019  AST 19 29  ALT 11 18  ALKPHOS 64 77  BILITOT <0.2 <0.2  PROT 7.2 7.6  ALBUMIN 3.3* 3.7   No results for input(s): LIPASE, AMYLASE in the last 8760 hours. No results for  input(s): AMMONIA in the last 8760 hours. CBC:  Recent Labs  07/16/15 0911 01/25/16 1019  WBC 4.7 4.6  NEUTROABS 3.2 2.7  HCT 38.5 40.1  MCV 84 86  PLT 212 240   Lipid Panel:  Recent Labs  07/16/15 0911 01/25/16 1019  CHOL 179 169  HDL 59 49  LDLCALC 100* 89  TRIG 98 154*  CHOLHDL 3.0 3.4   Lab Results  Component Value Date   HGBA1C 5.7 (H) 11/02/2014    Procedures since last visit: No results found.  Assessment/Plan 1. Hot flashes -persist but better with supplement she's using (not black cohosh)  2. Hypothyroidism, unspecified type -thyroid has been under better  control lately  3. Cyst of brain -felt not to be causing her nystagmus and balance problems when previously assessed by neurology  4. Nystagmus -ongoing and she's refused further workup  5. Balance problem -a bit worse lately, but she does not want further eval  6. Iron deficiency anemia due to chronic blood loss -cont iron bid, f/u cbc next time  7. History of CHF (congestive heart failure) -is in chf clinic now, but cardiomyopathy had significant improvement  8.  Need for flu shot -given  Labs/tests ordered:  No new, will get fasting at next appt Next appt:  4 mos med mgt  Emberlynn Riggan L. Holden Maniscalco, D.O. [facility] [address], [place] [address] Cell Phone (Mon-Fri 8am-5pm):  [phone] On Call:  [phone] & follow prompts after 5pm & weekends Office Phone:  [phone] Office Fax:  [phone]

## 2016-07-07 DIAGNOSIS — Z029 Encounter for administrative examinations, unspecified: Secondary | ICD-10-CM

## 2016-07-23 ENCOUNTER — Telehealth: Payer: Self-pay | Admitting: Internal Medicine

## 2016-07-23 NOTE — Telephone Encounter (Signed)
left msg asking pt to confirm this AWV appt w/ nurse. VDM (DD) °

## 2016-08-15 NOTE — Telephone Encounter (Signed)
left another msg asking pt to confirm this AWV appt w/ nurse. VDM (DD) °

## 2016-09-26 ENCOUNTER — Ambulatory Visit (INDEPENDENT_AMBULATORY_CARE_PROVIDER_SITE_OTHER): Payer: PPO | Admitting: Internal Medicine

## 2016-09-26 ENCOUNTER — Encounter: Payer: Self-pay | Admitting: Internal Medicine

## 2016-09-26 ENCOUNTER — Ambulatory Visit: Payer: PPO

## 2016-09-26 VITALS — BP 148/70 | HR 75 | Temp 97.6°F | Wt 137.0 lb

## 2016-09-26 DIAGNOSIS — Z13220 Encounter for screening for lipoid disorders: Secondary | ICD-10-CM | POA: Diagnosis not present

## 2016-09-26 DIAGNOSIS — F3341 Major depressive disorder, recurrent, in partial remission: Secondary | ICD-10-CM | POA: Diagnosis not present

## 2016-09-26 DIAGNOSIS — J449 Chronic obstructive pulmonary disease, unspecified: Secondary | ICD-10-CM

## 2016-09-26 DIAGNOSIS — Z8679 Personal history of other diseases of the circulatory system: Secondary | ICD-10-CM | POA: Diagnosis not present

## 2016-09-26 DIAGNOSIS — R232 Flushing: Secondary | ICD-10-CM | POA: Diagnosis not present

## 2016-09-26 DIAGNOSIS — I10 Essential (primary) hypertension: Secondary | ICD-10-CM

## 2016-09-26 DIAGNOSIS — F5101 Primary insomnia: Secondary | ICD-10-CM | POA: Diagnosis not present

## 2016-09-26 DIAGNOSIS — E039 Hypothyroidism, unspecified: Secondary | ICD-10-CM

## 2016-09-26 LAB — CBC WITH DIFFERENTIAL/PLATELET
Basophils Absolute: 0 cells/uL (ref 0–200)
Basophils Relative: 0 %
Eosinophils Absolute: 56 cells/uL (ref 15–500)
Eosinophils Relative: 1 %
HCT: 37.6 % (ref 35.0–45.0)
Hemoglobin: 11.9 g/dL (ref 11.7–15.5)
Lymphocytes Relative: 32 %
Lymphs Abs: 1792 cells/uL (ref 850–3900)
MCH: 27.6 pg (ref 27.0–33.0)
MCHC: 31.6 g/dL — ABNORMAL LOW (ref 32.0–36.0)
MCV: 87.2 fL (ref 80.0–100.0)
MPV: 10.9 fL (ref 7.5–12.5)
Monocytes Absolute: 392 cells/uL (ref 200–950)
Monocytes Relative: 7 %
Neutro Abs: 3360 cells/uL (ref 1500–7800)
Neutrophils Relative %: 60 %
Platelets: 218 10*3/uL (ref 140–400)
RBC: 4.31 MIL/uL (ref 3.80–5.10)
RDW: 15.3 % — ABNORMAL HIGH (ref 11.0–15.0)
WBC: 5.6 10*3/uL (ref 3.8–10.8)

## 2016-09-26 LAB — BASIC METABOLIC PANEL
BUN: 10 mg/dL (ref 7–25)
CO2: 21 mmol/L (ref 20–31)
Calcium: 8.9 mg/dL (ref 8.6–10.4)
Chloride: 108 mmol/L (ref 98–110)
Creat: 0.86 mg/dL (ref 0.50–1.05)
Glucose, Bld: 79 mg/dL (ref 65–99)
Potassium: 3.8 mmol/L (ref 3.5–5.3)
Sodium: 140 mmol/L (ref 135–146)

## 2016-09-26 LAB — LIPID PANEL
Cholesterol: 163 mg/dL (ref <200)
HDL: 60 mg/dL (ref >50)
LDL Cholesterol: 92 mg/dL (ref <100)
Total CHOL/HDL Ratio: 2.7 Ratio (ref <5.0)
Triglycerides: 54 mg/dL (ref <150)
VLDL: 11 mg/dL (ref <30)

## 2016-09-26 LAB — TSH: TSH: 3.62 mIU/L

## 2016-09-26 MED ORDER — CLONIDINE HCL 0.1 MG PO TABS: 0.1000 mg | ORAL_TABLET | Freq: Every day | ORAL | 3 refills | Status: DC

## 2016-09-26 NOTE — Patient Instructions (Signed)
I have sent in clonidine 0.1mg  at bedtime to help with your hot flashes.   If you get dizzy or lightheaded from this, let me know. It might be lowering your blood pressure too much.

## 2016-09-26 NOTE — Progress Notes (Signed)
Location:  [facility] clinic Provider:  Maryuri Warnke L. Mariea Clonts, D.O., C.M.D.  Code Status: full code Goals of Care:  Advanced Directives 09/26/2016  Does Patient Have a Medical Advance Directive? No  Would patient like information on creating a medical advance directive? No - Patient declined  Pre-existing out of facility DNR order (yellow form or pink MOST form) -   Chief Complaint  Patient presents with  . Medical Management of Chronic Issues    4 mth follow-up    HPI: Patient is a 51 y.o. female seen today for medical management of chronic diseases.    BP is elevated.  Having family problems.  158/80.  Repeat 148/70.  Has been stressed.  When she gets into family drama, she gets sob, then starts sweating, has to sit down somewhere or go outside and sit and then it calms down.  Varies in duration. First time it scared her when she was at her mother's.  Legs got weak, barely made it to the couch.  A little chest pain, but not much.  Says it is anxiety.  This am, "stress" showed up at her door.    Hypothyroidism:  Taking her levothyroxine.  No problems  Hot flashes continue. Wonders about clonidine for this--would rather take it than gabapentin.  COPD:  Doing ok outside of the episodes of anxiety.    Insomnia:  Sleeping well with current regimen.  Past Medical History:  Diagnosis Date  . Anemia   . At risk for sudden cardiac death 11-14-12  . CHF - Combined Systolic + Diastolic Dysfunction. EF of 15-20% with Grade II diastolic dysfunction on echo 10/27/12 10/28/2012  . Fibroids   . Hypertension   . S/P cardiac catheterization, 14-Nov-2012, normal coronaries with minimal luminal irregularities in RCA system 10/30/2012  . Thyroid disease    hypothyroidism    Past Surgical History:  Procedure Laterality Date  . LEFT AND RIGHT HEART CATHETERIZATION WITH CORONARY ANGIOGRAM N/A 2012/11/14   Procedure: LEFT AND RIGHT HEART CATHETERIZATION WITH CORONARY ANGIOGRAM;  Surgeon: Leonie Man, MD;   Location: [facility] CATH LAB;  Service: Cardiovascular;  Laterality: N/A;    Allergies  Allergen Reactions  . Codeine Hives    Allergies as of 09/26/2016      Reactions   Codeine Hives      Medication List       Accurate as of 09/26/16  9:52 AM. Always use your most recent med list.          acetaminophen 500 MG tablet Commonly known as:  TYLENOL Take 500 mg by mouth as needed.   aspirin 81 MG EC tablet Take 1 tablet (81 mg total) by mouth daily.   atorvastatin 10 MG tablet Commonly known as:  LIPITOR TAKE ONE TABLET BY MOUTH ONCE DAILY AT 6 PM   buPROPion 150 MG 24 hr tablet Commonly known as:  WELLBUTRIN XL Take 1 tablet (150 mg total) by mouth daily.   carvedilol 6.25 MG tablet Commonly known as:  COREG Take 1 tablet (6.25 mg total) by mouth 2 (two) times daily with a meal.   Ferrous Fumarate 324 (106 Fe) MG Tabs tablet Commonly known as:  HEMOCYTE - 106 mg FE Take 1 tablet (106 mg of iron total) by mouth 2 (two) times daily.   levothyroxine 112 MCG tablet Commonly known as:  SYNTHROID, LEVOTHROID TAKE ONE TABLET BY MOUTH ONCE DAILY BEFORE BREAKFAST   lisinopril 2.5 MG tablet Commonly known as:  PRINIVIL,ZESTRIL Take 1 tablet (2.5  mg total) by mouth 2 (two) times daily.   mirtazapine 7.5 MG tablet Commonly known as:  REMERON Take 1 tablet (7.5 mg total) by mouth at bedtime.   traZODone 150 MG tablet Commonly known as:  DESYREL Take 1 tablet (150 mg total) by mouth at bedtime.       Review of Systems:  Review of Systems  Constitutional: Negative for chills, fever and malaise/fatigue.  HENT: Negative for congestion.   Eyes: Positive for double vision.       Glasses  Respiratory: Positive for shortness of breath. Negative for cough and wheezing.   Cardiovascular: Positive for chest pain. Negative for palpitations and leg swelling.  Gastrointestinal: Negative for abdominal pain, blood in stool, constipation and melena.  Genitourinary: Negative for  dysuria.  Musculoskeletal: Negative for falls and myalgias.  Skin: Negative for rash.  Neurological: Negative for dizziness, loss of consciousness and weakness.  Psychiatric/Behavioral: Positive for depression. Negative for memory loss. The patient is nervous/anxious. The patient does not have insomnia.     Health Maintenance  Topic Date Due  . MAMMOGRAM  02/26/2018  . PAP SMEAR  01/25/2019  . Fecal DNA (Cologuard)  02/14/2019  . TETANUS/TDAP  01/24/2026  . INFLUENZA VACCINE  Completed  . Hepatitis C Screening  Completed  . HIV Screening  Completed    Physical Exam: Vitals:   09/26/16 0940  BP: (!) 158/80  Pulse: 75  Temp: 97.6 F (36.4 C)  TempSrc: Oral  SpO2: 98%  Weight: 137 lb (62.1 kg)   Body mass index is 22.8 kg/m. Physical Exam  Constitutional: She is oriented to person, place, and time. She appears well-nourished. No distress.  Thin female  Cardiovascular: Normal rate, regular rhythm and intact distal pulses.   Murmur heard. Pulmonary/Chest: Effort normal and breath sounds normal. No respiratory distress.  Abdominal: Soft. Bowel sounds are normal. She exhibits no distension. There is no tenderness.  Musculoskeletal: Normal range of motion.  Neurological: She is alert and oriented to person, place, and time.  Skin: Skin is warm and dry.  Psychiatric: She has a normal mood and affect.    Labs reviewed: Basic Metabolic Panel:  Recent Labs  01/25/16 1019  NA 141  K 4.3  CL 104  CO2 22  GLUCOSE 86  BUN 11  CREATININE 0.68  CALCIUM 9.4  TSH 4.150   Liver Function Tests:  Recent Labs  01/25/16 1019  AST 29  ALT 18  ALKPHOS 77  BILITOT <0.2  PROT 7.6  ALBUMIN 3.7   No results for input(s): LIPASE, AMYLASE in the last 8760 hours. No results for input(s): AMMONIA in the last 8760 hours. CBC:  Recent Labs  01/25/16 1019  WBC 4.6  NEUTROABS 2.7  HCT 40.1  MCV 86  PLT 240   Lipid Panel:  Recent Labs  01/25/16 1019  CHOL 169  HDL  49  LDLCALC 89  TRIG 154*  CHOLHDL 3.4   Lab Results  Component Value Date   HGBA1C 5.7 (H) 11/02/2014    Assessment/Plan 1. Essential hypertension -bp elevated -add clonidine 0.1mg  at hs due to her hot flashes also -counseled to call back if she gets dizzy (bp usually controlled when here so suspect all stress-induced) - CBC with Differential/Platelet - Basic metabolic panel  2. Hypothyroidism, unspecified type -cont current levothyroxine pending lab - TSH  3. Chronic obstructive pulmonary disease, unspecified COPD type ([place]) -no symptoms, not using any albuterol  4. Recurrent major depressive disorder, in partial remission ([place]) -  some increased stress lately and panic attacks -cont remeron, wellbutrin  5. History of CHF (congestive heart failure) -EF had improved, no related symptoms for a long time  6. Primary insomnia -cont trazodone, remeron, sleeping well  7. Hot flashes -ongoing, start clonidine low dose if tolerates  8. Screening, lipid - cont statin therapy, f/u labs - Lipid panel  Labs/tests ordered:   Orders Placed This Encounter  Procedures  . TSH  . CBC with Differential/Platelet  . Basic metabolic panel    Order Specific Question:   Has the patient fasted?    Answer:   Yes  . Lipid panel    Order Specific Question:   Has the patient fasted?    Answer:   Yes   Next appt:  4 mos med mgt   Macarius Ruark L. Drayke Grabel, D.O. [facility] [address], [place] [address] Cell Phone (Mon-Fri 8am-5pm):  [phone] On Call:  [phone] & follow prompts after 5pm & weekends Office Phone:  [phone] Office Fax:  [phone]

## 2016-09-29 ENCOUNTER — Encounter: Payer: Self-pay | Admitting: *Deleted

## 2016-09-30 DIAGNOSIS — H5500 Unspecified nystagmus: Secondary | ICD-10-CM | POA: Diagnosis not present

## 2016-10-14 ENCOUNTER — Encounter: Payer: Self-pay | Admitting: Internal Medicine

## 2016-10-22 ENCOUNTER — Other Ambulatory Visit: Payer: Self-pay | Admitting: Internal Medicine

## 2016-10-22 DIAGNOSIS — F32A Depression, unspecified: Secondary | ICD-10-CM

## 2016-10-22 DIAGNOSIS — F329 Major depressive disorder, single episode, unspecified: Secondary | ICD-10-CM

## 2016-11-03 ENCOUNTER — Ambulatory Visit (INDEPENDENT_AMBULATORY_CARE_PROVIDER_SITE_OTHER): Payer: PPO

## 2016-11-03 VITALS — BP 130/70 | HR 90 | Temp 98.4°F | Ht 65.0 in | Wt 144.6 lb

## 2016-11-03 DIAGNOSIS — Z0000 Encounter for general adult medical examination without abnormal findings: Secondary | ICD-10-CM | POA: Diagnosis not present

## 2016-11-03 NOTE — Progress Notes (Signed)
Subjective:   Miranda [name] is a 51 y.o. female who presents for an Initial Medicare Annual Wellness Visit.  Cardiac Risk Factors include: hypertension;smoking/ tobacco exposure;family history of premature cardiovascular disease     Objective:    Today's Vitals   11/03/16 0906  BP: 130/70  Pulse: 90  Temp: 98.4 F (36.9 C)  TempSrc: Oral  SpO2: 95%  Weight: 144 lb 9.6 oz (65.6 kg)  Height: 5\' 5"  (1.651 m)   Body mass index is 24.06 kg/m.   Current Medications (verified) Outpatient Encounter Prescriptions as of 11/03/2016  Medication Sig  . acetaminophen (TYLENOL) 500 MG tablet Take 500 mg by mouth as needed.  Marland Kitchen aspirin EC 81 MG EC tablet Take 1 tablet (81 mg total) by mouth daily.  Marland Kitchen atorvastatin (LIPITOR) 10 MG tablet TAKE ONE TABLET BY MOUTH ONCE DAILY AT 6 PM  . buPROPion (WELLBUTRIN XL) 150 MG 24 hr tablet TAKE ONE TABLET BY MOUTH ONCE DAILY  . carvedilol (COREG) 6.25 MG tablet Take 1 tablet (6.25 mg total) by mouth 2 (two) times daily with a meal.  . Ferrous Fumarate (HEMOCYTE - 106 MG FE) 324 (106 Fe) MG TABS tablet Take 1 tablet (106 mg of iron total) by mouth 2 (two) times daily.  Marland Kitchen levothyroxine (SYNTHROID, LEVOTHROID) 112 MCG tablet TAKE ONE TABLET BY MOUTH ONCE DAILY BEFORE BREAKFAST  . lisinopril (PRINIVIL,ZESTRIL) 2.5 MG tablet Take 1 tablet (2.5 mg total) by mouth 2 (two) times daily.  . mirtazapine (REMERON) 7.5 MG tablet Take 1 tablet (7.5 mg total) by mouth at bedtime.  . traZODone (DESYREL) 150 MG tablet Take 1 tablet (150 mg total) by mouth at bedtime.  . [DISCONTINUED] cloNIDine (CATAPRES) 0.1 MG tablet Take 1 tablet (0.1 mg total) by mouth at bedtime.   No facility-administered encounter medications on file as of 11/03/2016.     Allergies (verified) Codeine   History: Past Medical History:  Diagnosis Date  . Anemia   . At risk for sudden cardiac death 22-Nov-2012  . CHF - Combined Systolic + Diastolic Dysfunction. EF of 15-20% with Grade II  diastolic dysfunction on echo 10/27/12 10/28/2012  . Fibroids   . Hypertension   . S/P cardiac catheterization, 11-22-2012, normal coronaries with minimal luminal irregularities in RCA system 10/30/2012  . Thyroid disease    hypothyroidism   Past Surgical History:  Procedure Laterality Date  . LEFT AND RIGHT HEART CATHETERIZATION WITH CORONARY ANGIOGRAM N/A 11-22-12   Procedure: LEFT AND RIGHT HEART CATHETERIZATION WITH CORONARY ANGIOGRAM;  Surgeon: Leonie Man, MD;  Location: [facility] CATH LAB;  Service: Cardiovascular;  Laterality: N/A;   Family History  Problem Relation Age of Onset  . Cancer Paternal Grandfather     Bone  . Hypertension Mother   . Autoimmune disease Mother   . Stroke Father   . Hypertension Father   . Hypertension Maternal Grandmother   . Breast cancer Cousin   . Cancer Maternal Uncle     bone  . Arthritis Paternal Uncle    Social History   Occupational History  . 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 Smoking status: Former Smoker    Packs/day: 0.50    Years: 5.00    Types: Cigars, Cigarettes    Quit date: 08/04/1978  . Smokeless tobacco: Never Used     Comment: 1 cigar daily-quit 10/24/12    . Alcohol use 1.8 oz/week    3 Glasses of wine per week  Comment: a week  . Drug use: Yes    Frequency: 5.0 times per week    Types: Marijuana  . Sexual activity: Not Currently    Tobacco Counseling Counseling given: Not Answered   Activities of Daily Living In your present state of health, do you have any difficulty performing the following activities: 11/03/2016  Hearing? N  Vision? N  Difficulty concentrating or making decisions? Y  Walking or climbing stairs? Y  Dressing or bathing? N  Doing errands, shopping? N  Preparing Food and eating ? N  Using the Toilet? N  In the past six months, have you accidently leaked urine? N  Do you have problems with loss of bowel control? N  Managing your Medications? N  Managing your  Finances? N  Housekeeping or managing your Housekeeping? N  Some recent data might be hidden    Immunizations and Health Maintenance Immunization History  Administered Date(s) Administered  . Influenza,inj,Quad PF,36+ Mos 05/26/2016  . Pneumococcal Conjugate-13 12/14/2014  . Tdap 01/25/2016   There are no preventive care reminders to display for this patient.  Patient Care Team: Gayland Curry, DO as PCP - General (Geriatric Medicine) Emily Filbert, MD as Consulting Physician (Obstetrics and Gynecology) Jolaine Artist, MD as Consulting Physician (Cardiology) Penni Bombard, MD as Consulting Physician (Neurology)  Indicate any recent Medical Services you may have received from other than [facility] providers in the past year (date may be approximate).     Assessment:   This is a routine wellness examination for Miranda [name].   Hearing/Vision screen No exam data present  Dietary issues and exercise activities discussed: Current Exercise Habits: Home exercise routine, Type of exercise: Other - see comments (punching bag), Time (Minutes): 40, Frequency (Times/Week): 4, Weekly Exercise (Minutes/Week): 160, Intensity: Intense  Goals    . maintain lifestyle          Starting 11/03/2016 I would like to maintain my lifestyle.      Depression Screen PHQ 2/9 Scores 11/03/2016 09/26/2016 01/25/2016 04/19/2015 11/02/2014 05/19/2013  PHQ - 2 Score 0 0 0 0 1 4    Fall Risk Fall Risk  11/03/2016 01/25/2016 07/20/2015 04/19/2015 01/15/2015  Falls in the past year? No No No No No    Cognitive Function: Within normal limits MMSE - Mini Mental State Exam 11/03/2016  Not completed: Unable to complete        Screening Tests Health Maintenance  Topic Date Due  . INFLUENZA VACCINE  03/04/2017  . MAMMOGRAM  02/26/2018  . PAP SMEAR  01/25/2019  . Fecal DNA (Cologuard)  02/14/2019  . TETANUS/TDAP  01/24/2026  . Hepatitis C Screening  Completed  . HIV Screening  Completed      Plan:  I have  personally reviewed and addressed the Medicare Annual Wellness questionnaire and have noted the following in the patient's chart:  A. Medical and social history B. Use of alcohol, tobacco or illicit drugs  C. Current medications and supplements D. Functional ability and status E.  Nutritional status F.  Physical activity G. Advance directives H. List of other physicians I.  Hospitalizations, surgeries, and ER visits in previous 12 months J.  [place] to include hearing, vision, cognitive, depression L. Referrals and appointments - none  In addition, I have reviewed and discussed with patient certain preventive protocols, quality metrics, and best practice recommendations. A written personalized care plan for preventive services as well as general preventive health recommendations were provided to patient.  See  attached scanned questionnaire for additional information.   Signed,   Rich Reining, RN Nurse Health Advisor

## 2016-11-03 NOTE — Progress Notes (Signed)
   I reviewed health advisor's note, was available for consultation and agree with the assessment and plan as written.  Will see for acute RE: edema, dyspnea x 2 wks.  Rogelio Waynick L. Daritza Brees, D.O. [facility] [address], [place] [address] Cell Phone (Mon-Fri 8am-5pm):  [phone] On Call:  [phone] & follow prompts after 5pm & weekends Office Phone:  [phone] Office Fax:  [phone]   Quick Notes  Health Maintenance: Pt declined colonoscopy but will repeat cologuard in 2 years.Shingles due, education given.  Abnormal Screen: MMSE not done, pt under 65.  Patient Concerns: None  Nurse Concerns: Bilateral feet edema, SOB for 2 weeks. Appointment made for Friday.

## 2016-11-03 NOTE — Patient Instructions (Addendum)
Miranda [name] , Thank you for taking time to come for your Medicare Wellness Visit. I appreciate your ongoing commitment to your health goals. Please review the following plan we discussed and let me know if I can assist you in the future.   Screening recommendations/referrals: Colonoscopy due and declined. Pt will redue colorguard in 2 years Mammogram due 11/2018 Bone Density up to date Recommended yearly ophthalmology/optometry visit for glaucoma screening and checkup Recommended yearly dental visit for hygiene and checkup  Vaccinations: Influenza vaccine up to date Pneumococcal vaccine due age 53. Tdap vaccine due 2027 Shingles vaccine education given. If you decide you want the new one give [place] a call and we will put in prescription for you.  Advanced directives: Advance directive discussed with you today. I have provided a copy for you to complete at home and have notarized. Once this is complete please bring a copy in to our office so we can scan it into your chart.  Conditions/risks identified: Swelling in both feet, shortness of breath, and inability to sleep  Next appointment: Dr. Mariea Clonts 4/6 @ 8:15am  Preventive Care 40-64 Years, Female Preventive care refers to lifestyle choices and visits with your health care provider that can promote health and wellness. What does preventive care include?  A yearly physical exam. This is also called an annual well check.  Dental exams once or twice a year.  Routine eye exams. Ask your health care provider how often you should have your eyes checked.  Personal lifestyle choices, including:  Daily care of your teeth and gums.  Regular physical activity.  Eating a healthy diet.  Avoiding tobacco and drug use.  Limiting alcohol use.  Practicing safe sex.  Taking low-dose aspirin daily starting at age 62.  Taking vitamin and mineral supplements as recommended by your health care provider. What happens during an annual well  check? The services and screenings done by your health care provider during your annual well check will depend on your age, overall health, lifestyle risk factors, and family history of disease. Counseling  Your health care provider may ask you questions about your:  Alcohol use.  Tobacco use.  Drug use.  Emotional well-being.  Home and relationship well-being.  Sexual activity.  Eating habits.  Work and work Statistician.  Method of birth control.  Menstrual cycle.  Pregnancy history. Screening  You may have the following tests or measurements:  Height, weight, and BMI.  Blood pressure.  Lipid and cholesterol levels. These may be checked every 5 years, or more frequently if you are over 18 years old.  Skin check.  Lung cancer screening. You may have this screening every year starting at age 76 if you have a 30-pack-year history of smoking and currently smoke or have quit within the past 15 years.  Fecal occult blood test (FOBT) of the stool. You may have this test every year starting at age 34.  Flexible sigmoidoscopy or colonoscopy. You may have a sigmoidoscopy every 5 years or a colonoscopy every 10 years starting at age 37.  Hepatitis C blood test.  Hepatitis B blood test.  Sexually transmitted disease (STD) testing.  Diabetes screening. This is done by checking your blood sugar (glucose) after you have not eaten for a while (fasting). You may have this done every 1-3 years.  Mammogram. This may be done every 1-2 years. Talk to your health care provider about when you should start having regular mammograms. This may depend on whether you have a family  history of breast cancer.  BRCA-related cancer screening. This may be done if you have a family history of breast, ovarian, tubal, or peritoneal cancers.  Pelvic exam and Pap test. This may be done every 3 years starting at age 21. Starting at age 30, this may be done every 5 years if you have a Pap test in  combination with an HPV test.  Bone density scan. This is done to screen for osteoporosis. You may have this scan if you are at high risk for osteoporosis. Discuss your test results, treatment options, and if necessary, the need for more tests with your health care provider. Vaccines  Your health care provider may recommend certain vaccines, such as:  Influenza vaccine. This is recommended every year.  Tetanus, diphtheria, and acellular pertussis (Tdap, Td) vaccine. You may need a Td booster every 10 years.  Zoster vaccine. You may need this after age 60.  Pneumococcal 13-valent conjugate (PCV13) vaccine. You may need this if you have certain conditions and were not previously vaccinated.  Pneumococcal polysaccharide (PPSV23) vaccine. You may need one or two doses if you smoke cigarettes or if you have certain conditions. Talk to your health care provider about which screenings and vaccines you need and how often you need them. This information is not intended to replace advice given to you by your health care provider. Make sure you discuss any questions you have with your health care provider. Document Released: 08/17/2015 Document Revised: 04/09/2016 Document Reviewed: 05/22/2015 Elsevier Interactive Patient Education  2017 [employer]    Fall Prevention in the Home Falls can cause injuries. They can happen to people of all ages. There are many things you can do to make your home safe and to help prevent falls. What can I do on the outside of my home?  Regularly fix the edges of walkways and driveways and fix any cracks.  Remove anything that might make you trip as you walk through a door, such as a raised step or threshold.  Trim any bushes or trees on the path to your home.  Use bright outdoor lighting.  Clear any walking paths of anything that might make someone trip, such as rocks or tools.  Regularly check to see if handrails are loose or broken. Make sure that both  sides of any steps have handrails.  Any raised decks and porches should have guardrails on the edges.  Have any leaves, snow, or ice cleared regularly.  Use sand or salt on walking paths during winter.  Clean up any spills in your garage right away. This includes oil or grease spills. What can I do in the bathroom?  Use night lights.  Install grab bars by the toilet and in the tub and shower. Do not use towel bars as grab bars.  Use non-skid mats or decals in the tub or shower.  If you need to sit down in the shower, use a plastic, non-slip stool.  Keep the floor dry. Clean up any water that spills on the floor as soon as it happens.  Remove soap buildup in the tub or shower regularly.  Attach bath mats securely with double-sided non-slip rug tape.  Do not have throw rugs and other things on the floor that can make you trip. What can I do in the bedroom?  Use night lights.  Make sure that you have a light by your bed that is easy to reach.  Do not use any sheets or blankets that are too   big for your bed. They should not hang down onto the floor.  Have a firm chair that has side arms. You can use this for support while you get dressed.  Do not have throw rugs and other things on the floor that can make you trip. What can I do in the kitchen?  Clean up any spills right away.  Avoid walking on wet floors.  Keep items that you use a lot in easy-to-reach places.  If you need to reach something above you, use a strong step stool that has a grab bar.  Keep electrical cords out of the way.  Do not use floor polish or wax that makes floors slippery. If you must use wax, use non-skid floor wax.  Do not have throw rugs and other things on the floor that can make you trip. What can I do with my stairs?  Do not leave any items on the stairs.  Make sure that there are handrails on both sides of the stairs and use them. Fix handrails that are broken or loose. Make sure that  handrails are as long as the stairways.  Check any carpeting to make sure that it is firmly attached to the stairs. Fix any carpet that is loose or worn.  Avoid having throw rugs at the top or bottom of the stairs. If you do have throw rugs, attach them to the floor with carpet tape.  Make sure that you have a light switch at the top of the stairs and the bottom of the stairs. If you do not have them, ask someone to add them for you. What else can I do to help prevent falls?  Wear shoes that:  Do not have high heels.  Have rubber bottoms.  Are comfortable and fit you well.  Are closed at the toe. Do not wear sandals.  If you use a stepladder:  Make sure that it is fully opened. Do not climb a closed stepladder.  Make sure that both sides of the stepladder are locked into place.  Ask someone to hold it for you, if possible.  Clearly mark and make sure that you can see:  Any grab bars or handrails.  First and last steps.  Where the edge of each step is.  Use tools that help you move around (mobility aids) if they are needed. These include:  Canes.  Walkers.  Scooters.  Crutches.  Turn on the lights when you go into a dark area. Replace any light bulbs as soon as they burn out.  Set up your furniture so you have a clear path. Avoid moving your furniture around.  If any of your floors are uneven, fix them.  If there are any pets around you, be aware of where they are.  Review your medicines with your doctor. Some medicines can make you feel dizzy. This can increase your chance of falling. Ask your doctor what other things that you can do to help prevent falls. This information is not intended to replace advice given to you by your health care provider. Make sure you discuss any questions you have with your health care provider. Document Released: 05/17/2009 Document Revised: 12/27/2015 Document Reviewed: 08/25/2014 Elsevier Interactive Patient Education  2017  [employer].

## 2016-11-07 ENCOUNTER — Ambulatory Visit (INDEPENDENT_AMBULATORY_CARE_PROVIDER_SITE_OTHER): Payer: PPO | Admitting: Internal Medicine

## 2016-11-07 ENCOUNTER — Encounter: Payer: Self-pay | Admitting: Internal Medicine

## 2016-11-07 VITALS — BP 148/80 | HR 89 | Temp 97.4°F | Wt 144.0 lb

## 2016-11-07 DIAGNOSIS — I5023 Acute on chronic systolic (congestive) heart failure: Secondary | ICD-10-CM

## 2016-11-07 DIAGNOSIS — J449 Chronic obstructive pulmonary disease, unspecified: Secondary | ICD-10-CM

## 2016-11-07 DIAGNOSIS — I10 Essential (primary) hypertension: Secondary | ICD-10-CM | POA: Diagnosis not present

## 2016-11-07 MED ORDER — FUROSEMIDE 20 MG PO TABS: 20.0000 mg | ORAL_TABLET | Freq: Every day | ORAL | 0 refills | Status: DC

## 2016-11-07 MED ORDER — POTASSIUM CHLORIDE CRYS ER 20 MEQ PO TBCR: 20.0000 meq | EXTENDED_RELEASE_TABLET | Freq: Every day | ORAL | 0 refills | Status: DC

## 2016-11-07 NOTE — Progress Notes (Signed)
Location:  [facility] clinic Provider: Gorgeous Newlun L. Mariea Clonts, D.O., C.M.D.  Code Status: full code Goals of Care:  Advanced Directives 11/03/2016  Does Patient Have a Medical Advance Directive? No  Would patient like information on creating a medical advance directive? Yes (MAU/Ambulatory/Procedural Areas - Information given)  Pre-existing out of facility DNR order (yellow form or pink MOST form) -   Chief Complaint  Patient presents with  . Acute Visit    left ankle swelling, SOB for 2 weeks   HPI: Patient is a 50 y.o. female with h/o systolic chf historically that has improved by echo and she'd been asymptomatic for about 4 years.  She is seen today for an acute visit for left ankle swelling and shortness of breath for 2-3 weeks.  She is also having difficulty sleeping.  Thinks it is due to being sob.  Can only sleep 1.5 to 2 hrs except wed night when slept better.  No dietary changes.  Feels sob anytime.  She is coughing up phlegm also.  No fever or chills.  No chest pain.  She has abdominal swelling and is more sob when bending over to put her shoes back on after I examined her swollen ankles.    Stopped cloniidine b/c of sob, constipation and stomach cramps, dry throat.   Past Medical History:  Diagnosis Date  . Anemia   . At risk for sudden cardiac death 17-Nov-2012  . CHF - Combined Systolic + Diastolic Dysfunction. EF of 15-20% with Grade II diastolic dysfunction on echo 10/27/12 10/28/2012  . Fibroids   . Hypertension   . S/P cardiac catheterization, 11-17-12, normal coronaries with minimal luminal irregularities in RCA system 10/30/2012  . Thyroid disease    hypothyroidism    Past Surgical History:  Procedure Laterality Date  . LEFT AND RIGHT HEART CATHETERIZATION WITH CORONARY ANGIOGRAM N/A 11-17-12   Procedure: LEFT AND RIGHT HEART CATHETERIZATION WITH CORONARY ANGIOGRAM;  Surgeon: Leonie Man, MD;  Location: [facility] CATH LAB;  Service: Cardiovascular;  Laterality: N/A;    Allergies    Allergen Reactions  . Codeine Hives    Allergies as of 11/07/2016      Reactions   Codeine Hives      Medication List       Accurate as of 11/07/16  8:34 AM. Always use your most recent med list.          acetaminophen 500 MG tablet Commonly known as:  TYLENOL Take 500 mg by mouth as needed.   aspirin 81 MG EC tablet Take 1 tablet (81 mg total) by mouth daily.   atorvastatin 10 MG tablet Commonly known as:  LIPITOR TAKE ONE TABLET BY MOUTH ONCE DAILY AT 6 PM   buPROPion 150 MG 24 hr tablet Commonly known as:  WELLBUTRIN XL TAKE ONE TABLET BY MOUTH ONCE DAILY   carvedilol 6.25 MG tablet Commonly known as:  COREG Take 1 tablet (6.25 mg total) by mouth 2 (two) times daily with a meal.   Ferrous Fumarate 324 (106 Fe) MG Tabs tablet Commonly known as:  HEMOCYTE - 106 mg FE Take 1 tablet (106 mg of iron total) by mouth 2 (two) times daily.   levothyroxine 112 MCG tablet Commonly known as:  SYNTHROID, LEVOTHROID TAKE ONE TABLET BY MOUTH ONCE DAILY BEFORE BREAKFAST   lisinopril 2.5 MG tablet Commonly known as:  PRINIVIL,ZESTRIL Take 1 tablet (2.5 mg total) by mouth 2 (two) times daily.   mirtazapine 7.5 MG tablet Commonly known as:  REMERON Take 1 tablet (7.5 mg total) by mouth at bedtime.   traZODone 150 MG tablet Commonly known as:  DESYREL Take 1 tablet (150 mg total) by mouth at bedtime.       Review of Systems:  Review of Systems  Constitutional: Positive for malaise/fatigue. Negative for chills and fever.       Weight gain  Eyes: Negative for blurred vision.  Respiratory: Positive for cough, sputum production and shortness of breath. Negative for hemoptysis and wheezing.   Cardiovascular: Positive for orthopnea, leg swelling and PND. Negative for chest pain, palpitations and claudication.  Gastrointestinal: Positive for constipation.       Abdominal swelling and cramping  Genitourinary: Negative for dysuria, frequency and urgency.  Musculoskeletal:  Negative for falls.  Neurological: Negative for dizziness, loss of consciousness and weakness.  Endo/Heme/Allergies: Does not bruise/bleed easily.  Psychiatric/Behavioral: Positive for depression. The patient has insomnia.     Health Maintenance  Topic Date Due  . INFLUENZA VACCINE  03/04/2017  . MAMMOGRAM  02/26/2018  . PAP SMEAR  01/25/2019  . Fecal DNA (Cologuard)  02/14/2019  . TETANUS/TDAP  01/24/2026  . Hepatitis C Screening  Completed  . HIV Screening  Completed    Physical Exam: Vitals:   11/07/16 0823  BP: (!) 148/80  Pulse: 89  Temp: 97.4 F (36.3 C)  TempSrc: Oral  SpO2: 96%  Weight: 144 lb (65.3 kg)   Body mass index is 23.96 kg/m. Physical Exam  Constitutional: She is oriented to person, place, and time. She appears well-developed and well-nourished. No distress.  Neck: No JVD present.  Cardiovascular: Normal rate, regular rhythm, normal heart sounds and intact distal pulses.  Exam reveals no gallop and no friction rub.   No murmur heard. Pulmonary/Chest: She has rales.  Rales at left base, dyspneic on exertion and when stooping over, nonpitting edema of bilateral ankles--left greater than right  Abdominal: Soft. Bowel sounds are normal. She exhibits distension. She exhibits no mass. There is no tenderness. There is no rebound and no guarding.  Musculoskeletal: Normal range of motion.  Neurological: She is alert and oriented to person, place, and time.  Skin: Skin is warm and dry.  Psychiatric:  Sits with head down and does not make good eye contact    Labs reviewed: Basic Metabolic Panel:  Recent Labs  01/25/16 1019 09/26/16 1019  NA 141 140  K 4.3 3.8  CL 104 108  CO2 22 21  GLUCOSE 86 79  BUN 11 10  CREATININE 0.68 0.86  CALCIUM 9.4 8.9  TSH 4.150 3.62   Liver Function Tests:  Recent Labs  01/25/16 1019  AST 29  ALT 18  ALKPHOS 77  BILITOT <0.2  PROT 7.6  ALBUMIN 3.7   No results for input(s): LIPASE, AMYLASE in the last 8760  hours. No results for input(s): AMMONIA in the last 8760 hours. CBC:  Recent Labs  01/25/16 1019 09/26/16 1019  WBC 4.6 5.6  NEUTROABS 2.7 3,360  HGB  --  11.9  HCT 40.1 37.6  MCV 86 87.2  PLT 240 218   Lipid Panel:  Recent Labs  01/25/16 1019 09/26/16 1019  CHOL 169 163  HDL 49 60  LDLCALC 89 92  TRIG 154* 54  CHOLHDL 3.4 2.7   Lab Results  Component Value Date   HGBA1C 5.7 (H) 11/02/2014    Assessment/Plan 1. Acute on chronic systolic CHF (congestive heart failure) (HCC) - cont asa, coreg, ace, statin - add lasix and  potassium for 3 days, then call back Monday with daily weights and symptoms -if not back to baseline, will continue lasix and potassium and schedule f/u for later in the week and arrange for echo f/u - furosemide (LASIX) 20 MG tablet; Take 1 tablet (20 mg total) by mouth daily.  Dispense: 30 tablet; Refill: 0 - potassium chloride SA (K-DUR,KLOR-CON) 20 MEQ tablet; Take 1 tablet (20 mEq total) by mouth daily.  Dispense: 30 tablet; Refill: 0  2. Essential hypertension -bp elevated this am, suspect due to volume overload based on her appearance and increased weight this month vs. Last  -cont ace, coreg, add diuretic at least for 3 days  3.  Unspecified COPD -no wheezing or rhonchi heard -suspect cough with sputum production is due to volume overload  Labs/tests ordered: no new today Next appt:  01/30/2017  Laray Rivkin L. Samule Life, D.O. [facility] [address], [place] [address] Cell Phone (Mon-Fri 8am-5pm):  [phone] On Call:  [phone] & follow prompts after 5pm & weekends Office Phone:  [phone] Office Fax:  [phone]

## 2016-11-07 NOTE — Patient Instructions (Signed)
Take the lasix and potassium daily beginning today and through the weekend.    When you get up in the morning, weigh yourself first thing and write it down.    On Monday, call me with an update.

## 2016-11-10 ENCOUNTER — Telehealth: Payer: Self-pay | Admitting: *Deleted

## 2016-11-10 DIAGNOSIS — I5023 Acute on chronic systolic (congestive) heart failure: Secondary | ICD-10-CM

## 2016-11-10 MED ORDER — FUROSEMIDE 20 MG PO TABS: 40.0000 mg | ORAL_TABLET | Freq: Every day | ORAL | 0 refills | Status: DC

## 2016-11-10 MED ORDER — POTASSIUM CHLORIDE CRYS ER 20 MEQ PO TBCR: 40.0000 meq | EXTENDED_RELEASE_TABLET | Freq: Every day | ORAL | 0 refills | Status: DC

## 2016-11-10 NOTE — Telephone Encounter (Signed)
Patient was placed on Lasix and Potassium at last OV and was told to call today with weights. Friday 140, Saturday 139, Sunday 139, Monday 140. Patient states that she still has swelling in her ankles, has gone down alittle but swelling still there. Does have some SOB. Currently taking the Lasix and Potassium. Please Advise.

## 2016-11-10 NOTE — Telephone Encounter (Signed)
I'd like her to double both the lasix and the potassium (please change in med list).  I would like for her to see Dr. Marlou Porch for further evaluation and possibly repeat echocardiogram.  She has seen him before.

## 2016-11-10 NOTE — Telephone Encounter (Signed)
Patient notified and agreed. Medication list updated and referral placed for Dr. Marlou Porch.

## 2016-11-26 NOTE — Progress Notes (Signed)
Cardiology Office Note   Date:  11/27/2016   ID:  Miranda [name], DOB 01/07/1966, MRN [id number]  PCP:  Hollace Kinnier, DO  Cardiologist:  Dr. Marlou Porch    Chief Complaint  Patient presents with  . Congestive Heart Failure      History of Present Illness: Miranda [name] is a 51 y.o. female who presents for lower ext edema and and SOB.    She has a hx of chronic systolic HF, NICM, with previuos EF 15%.  In March 2015 EF was 50%.  On last visit inSept 2017 she was doing well on ACE and BB- and no longer on lasix.  Her EKG is chronically abnormal with T wave inversion in several leads.     On cath in 2014 she had no CAD,   Today she presents after seeing PCP with HF, came on suddenly with swelling and SOB, swelling in lower ext and abd.  PCP started Lasix and she is down 7 lbs, but continues with SOB.  Walking in from parking deck she did not have to stop but she was SOB.  No chest pain but + heartburn that has resolved.  She is up at night due to coughing   She will cough up yellow to green mucus.  We discussed allegra, zyrtec, and clritin on of them and Flonase.  She does feel better but still SOB.  No fevers.  Past Medical History:  Diagnosis Date  . Anemia   . At risk for sudden cardiac death Nov 16, 2012  . CHF - Combined Systolic + Diastolic Dysfunction. EF of 15-20% with Grade II diastolic dysfunction on echo 10/27/12 10/28/2012  . Fibroids   . Hypertension   . S/P cardiac catheterization, 2012/11/16, normal coronaries with minimal luminal irregularities in RCA system 10/30/2012  . Thyroid disease    hypothyroidism    Past Surgical History:  Procedure Laterality Date  . LEFT AND RIGHT HEART CATHETERIZATION WITH CORONARY ANGIOGRAM N/A 16-Nov-2012   Procedure: LEFT AND RIGHT HEART CATHETERIZATION WITH CORONARY ANGIOGRAM;  Surgeon: Leonie Man, MD;  Location: [facility] CATH LAB;  Service: Cardiovascular;  Laterality: N/A;     Current Outpatient Prescriptions  Medication Sig Dispense  Refill  . acetaminophen (TYLENOL) 500 MG tablet Take 500 mg by mouth as needed for headache (pain).     Marland Kitchen aspirin EC 81 MG EC tablet Take 1 tablet (81 mg total) by mouth daily.    Marland Kitchen atorvastatin (LIPITOR) 10 MG tablet TAKE ONE TABLET BY MOUTH ONCE DAILY AT 6 PM 90 tablet 3  . buPROPion (WELLBUTRIN XL) 150 MG 24 hr tablet TAKE ONE TABLET BY MOUTH ONCE DAILY 30 tablet 5  . carvedilol (COREG) 6.25 MG tablet Take 1 tablet (6.25 mg total) by mouth 2 (two) times daily with a meal. 180 tablet 3  . Ferrous Fumarate (HEMOCYTE - 106 MG FE) 324 (106 Fe) MG TABS tablet Take 1 tablet (106 mg of iron total) by mouth 2 (two) times daily. 180 tablet 3  . furosemide (LASIX) 20 MG tablet Take 2 tablets (40 mg total) by mouth daily. 60 tablet 0  . levothyroxine (SYNTHROID, LEVOTHROID) 112 MCG tablet TAKE ONE TABLET BY MOUTH ONCE DAILY BEFORE BREAKFAST 30 tablet 4  . lisinopril (PRINIVIL,ZESTRIL) 2.5 MG tablet Take 1 tablet (2.5 mg total) by mouth 2 (two) times daily. 180 tablet 3  . mirtazapine (REMERON) 7.5 MG tablet Take 1 tablet (7.5 mg total) by mouth at bedtime. 90 tablet 3  . potassium chloride SA (K-DUR,KLOR-CON)  20 MEQ tablet Take 2 tablets (40 mEq total) by mouth daily. 60 tablet 0  . traZODone (DESYREL) 150 MG tablet Take 1 tablet (150 mg total) by mouth at bedtime. 90 tablet 3   No current facility-administered medications for this visit.     Allergies:   Codeine    Social History:  The patient  reports that she quit smoking about 38 years ago. Her smoking use included Cigars and Cigarettes. She has a 2.50 pack-year smoking history. She has never used smokeless tobacco. She reports that she drinks about 1.8 oz of alcohol per week . She reports that she uses drugs, including Marijuana, about 5 times per week.   Family History:  The patient's family history includes Arthritis in her paternal uncle; Autoimmune disease in her mother; Breast cancer in her cousin; Cancer in her maternal uncle and paternal  grandfather; Hypertension in her father, maternal grandmother, and mother; Stroke in her father.    ROS:  General:no colds or fevers, + 7lb  Weight loss. Skin:no rashes or ulcers HEENT:no blurred vision, no congestion CV:see HPI PUL:see HPI GI:no diarrhea some constipation nor melena, no indigestion GU:no hematuria, no dysuria MS:no joint pain, no claudication Neuro:no syncope, no lightheadedness Endo:no diabetes, + thyroid disease and last check TSH is 3.62.  Wt Readings from Last 3 Encounters:  11/27/16 137 lb 12.8 oz (62.5 kg)  11/07/16 144 lb (65.3 kg)  11/03/16 144 lb 9.6 oz (65.6 kg)     PHYSICAL EXAM: VS:  BP (!) 150/66   Pulse 76   Ht 5' 4.5" (1.638 m)   Wt 137 lb 12.8 oz (62.5 kg)   LMP 01/17/2015   SpO2 94%   BMI 23.29 kg/m  , BMI Body mass index is 23.29 kg/m. General:Pleasant affect, NAD Skin:Warm and dry, brisk capillary refill HEENT:normocephalic, sclera clear, mucus membranes moist Neck:supple, + JVD, no bruits  Heart:S1S2 RRR without murmur, gallup, rub or click Lungs: without rales, no rhonchi, or wheezes [id number], non tender, + BS, do not palpate liver spleen or masses Ext:no lower ext edema, 2+ pedal pulses, 2+ radial pulses Neuro:alert and oriented X 3, MAE, follows commands, + facial symmetry    EKG:  EKG is ordered today. The ekg ordered today demonstrates SR T wave inversions II,III, AVF which are old and V3, new T wave inversions in V4-6 new from last EKG but this was present in 2014 with HF    Recent Labs: 01/25/2016: ALT 18 09/26/2016: BUN 10; Creat 0.86; Hemoglobin 11.9; Platelets 218; Potassium 3.8; Sodium 140; TSH 3.62    Lipid Panel    Component Value Date/Time   CHOL 163 09/26/2016 1019   CHOL 169 01/25/2016 1019   TRIG 54 09/26/2016 1019   HDL 60 09/26/2016 1019   HDL 49 01/25/2016 1019   CHOLHDL 2.7 09/26/2016 1019   VLDL 11 09/26/2016 1019   LDLCALC 92 09/26/2016 1019   LDLCALC 89 01/25/2016 1019       Other studies  Reviewed: Additional studies/ records that were reviewed today include: .  ECHO 2015 Study Conclusions  - Left ventricle: The cavity size was normal. Wall thickness was normal. The estimated ejection fraction was 50%. Diffuse hypokinesis. Doppler parameters are consistent with abnormal left ventricular relaxation (grade 1 diastolic dysfunction). - Aortic valve: There was no stenosis. - Aorta: Mildly dilated aortic root. Aortic root dimension: 67m (ED). - Mitral valve: Mildly calcified annulus. Normal thickness leaflets . Trivial regurgitation. - Left atrium: The atrium was mildly dilated. -  Right ventricle: The cavity size was normal. Systolic function was normal. - Pulmonary arteries: No complete TR doppler jet so unable to estimate PA systolic pressure. - Inferior vena cava: The vessel was normal in size; the respirophasic diameter changes were in the normal range (= 50%); findings are consistent with normal central venous pressure. Impressions:  - Normal LV size and with mildly decreased systolic function, EF [id number]. Mild diffuse hypokinesis. Normal RV size and systolic function. No significant valvular abnormalities.   Cardiac cath 10/2012  POST-OPERATIVE DIAGNOSIS:    Severe nonischemic dilated cardiomyopathy with ejection fraction roughly 15% that correlates well with cardiac output of roughly 3.6 tablet Fick and thermal dilution.  Mildly elevated LVEDP that correlates well with the pulmonary tablet wedge pressure of roughly 20-23 mmHg, with essentially normal right ventricular pressures.  Angiographic normal coronary arteries with only minimal luminal irregularities in the RCA system.  ASSESSMENT AND PLAN:  1.  Acute systolic and diastolic HF, will check Echo to re-eval EF and diastolic dysfunction.  She has hx on NICM with improved EF in 2015 to 50%, G1DD, mildly dilated aortic root.  Today with ongoing SOB will increase lasix to 40 BID  for 3 days then back to 40 daily.  Will check BMP, BNP and CBC.  And with continued SOB will check 2 view CXR to rule out infection.  I will see her back in 1 week to make sure she is better.   2. URI add amoxicillin 250 mg po TID, no allergies to PCN.    3.  HTN elevated today may be due to volume overload- will wait until next week once fluid decreased to increase ACE.  4.  HLD  Controlled on statin, with LDL 92 and HDL 60  5. Abnormal EKG may have lateral T wave inversions due to HF, same changes in in 2014 with patent coronary arteries.     Current medicines are reviewed with the patient today.  The patient Has no concerns regarding medicines.  The following changes have been made:  See above Labs/ tests ordered today include:see above  Disposition:   FU:  see above  Signed, Cecilie Kicks, NP  11/27/2016 8:57 AM    [facility] [address], [place], [place], [place] Phone: [phone]; Fax: [phone]

## 2016-11-27 ENCOUNTER — Encounter: Payer: Self-pay | Admitting: Cardiology

## 2016-11-27 ENCOUNTER — Ambulatory Visit (INDEPENDENT_AMBULATORY_CARE_PROVIDER_SITE_OTHER): Payer: PPO | Admitting: Cardiology

## 2016-11-27 ENCOUNTER — Ambulatory Visit
Admission: RE | Admit: 2016-11-27 | Discharge: 2016-11-27 | Disposition: A | Discharge status: Home / Self Care | Payer: PPO | Source: Ambulatory Visit | Attending: Cardiology | Admitting: Cardiology

## 2016-11-27 VITALS — BP 150/66 | HR 76 | Ht 64.5 in | Wt 137.8 lb

## 2016-11-27 DIAGNOSIS — R9431 Abnormal electrocardiogram [ECG] [EKG]: Secondary | ICD-10-CM

## 2016-11-27 DIAGNOSIS — Z8679 Personal history of other diseases of the circulatory system: Secondary | ICD-10-CM | POA: Diagnosis not present

## 2016-11-27 DIAGNOSIS — R0602 Shortness of breath: Secondary | ICD-10-CM

## 2016-11-27 DIAGNOSIS — I5042 Chronic combined systolic (congestive) and diastolic (congestive) heart failure: Secondary | ICD-10-CM | POA: Diagnosis not present

## 2016-11-27 DIAGNOSIS — I5043 Acute on chronic combined systolic (congestive) and diastolic (congestive) heart failure: Secondary | ICD-10-CM

## 2016-11-27 DIAGNOSIS — E782 Mixed hyperlipidemia: Secondary | ICD-10-CM

## 2016-11-27 DIAGNOSIS — R0600 Dyspnea, unspecified: Secondary | ICD-10-CM | POA: Diagnosis not present

## 2016-11-27 DIAGNOSIS — J069 Acute upper respiratory infection, unspecified: Secondary | ICD-10-CM | POA: Diagnosis not present

## 2016-11-27 DIAGNOSIS — I10 Essential (primary) hypertension: Secondary | ICD-10-CM

## 2016-11-27 LAB — CBC WITH DIFFERENTIAL/PLATELET
Basophils Absolute: 0 10*3/uL (ref 0.0–0.2)
Basos: 0 %
EOS (ABSOLUTE): 0.1 10*3/uL (ref 0.0–0.4)
Eos: 1 %
Hematocrit: 36.8 % (ref 34.0–46.6)
Hemoglobin: 11 g/dL — ABNORMAL LOW (ref 11.1–15.9)
Immature Grans (Abs): 0 10*3/uL (ref 0.0–0.1)
Immature Granulocytes: 1 %
Lymphocytes Absolute: 2 10*3/uL (ref 0.7–3.1)
Lymphs: 32 %
MCH: 28 pg (ref 26.6–33.0)
MCHC: 29.9 g/dL — ABNORMAL LOW (ref 31.5–35.7)
MCV: 94 fL (ref 79–97)
Monocytes Absolute: 0.3 10*3/uL (ref 0.1–0.9)
Monocytes: 5 %
Neutrophils Absolute: 3.7 10*3/uL (ref 1.4–7.0)
Neutrophils: 61 %
Platelets: 232 10*3/uL (ref 150–379)
RBC: 3.93 x10E6/uL (ref 3.77–5.28)
RDW: 19.7 % — ABNORMAL HIGH (ref 12.3–15.4)
WBC: 6.1 10*3/uL (ref 3.4–10.8)

## 2016-11-27 LAB — BASIC METABOLIC PANEL
BUN/Creatinine Ratio: 15 (ref 9–23)
BUN: 11 mg/dL (ref 6–24)
CO2: 22 mmol/L (ref 18–29)
Calcium: 8.8 mg/dL (ref 8.7–10.2)
Chloride: 107 mmol/L — ABNORMAL HIGH (ref 96–106)
Creatinine, Ser: 0.71 mg/dL (ref 0.57–1.00)
GFR calc Af Amer: 112 mL/min/{1.73_m2} (ref >59)
GFR calc non Af Amer: 98 mL/min/{1.73_m2} (ref >59)
Glucose: 82 mg/dL (ref 65–99)
Potassium: 3.9 mmol/L (ref 3.5–5.2)
Sodium: 144 mmol/L (ref 134–144)

## 2016-11-27 LAB — PRO B NATRIURETIC PEPTIDE: NT-Pro BNP: 5337 pg/mL — ABNORMAL HIGH (ref 0–249)

## 2016-11-27 MED ORDER — AMOXICILLIN 250 MG PO CAPS: 250.0000 mg | ORAL_CAPSULE | Freq: Three times a day (TID) | ORAL | 0 refills | Status: DC

## 2016-11-27 NOTE — Patient Instructions (Signed)
Medication Instructions:  Your physician has recommended you make the following change in your medication:  1. Increase Lasix (40 mg) twice daily for 3 days than go back to Lasix (40 mg ) daily. 2. Start Amoxicillin (250 mg) three times a day till finished. 3. Can use Flonase over the counter for allergies. 4. Can use Claritin/Allegra/Zyrtec over the counter for allergies.    Labwork: Your physician recommends that you have lab work today: bmet/cbc/bnp   Testing/Procedures: A chest x-ray takes a picture of the organs and structures inside the chest, including the heart, lungs, and blood vessels. This test can show several things, including, whether the heart is enlarges; whether fluid is building up in the lungs; and whether pacemaker / defibrillator leads are still in place. GO TO [place].   Your physician has requested that you have an echocardiogram. Echocardiography is a painless test that uses sound waves to create images of your heart. It provides your doctor with information about the size and shape of your heart and how well your heart's chambers and valves are working. This procedure takes approximately one hour. There are no restrictions for this procedure.     Follow-Up: Your physician recommends that you keep your scheduled follow-up appointment with Cecilie Kicks, NP next week.   Any Other Special Instructions Will Be Listed Below (If Applicable).      If you need a refill on your cardiac medications before your next appointment, please call your pharmacy.

## 2016-11-28 ENCOUNTER — Telehealth: Payer: Self-pay | Admitting: Cardiology

## 2016-11-28 NOTE — Telephone Encounter (Signed)
Returned pts call and discussed cxr and lab results.

## 2016-11-28 NOTE — Telephone Encounter (Signed)
Patient returning your call for results

## 2016-11-28 NOTE — Telephone Encounter (Signed)
-----   Message from Isaiah Serge, NP sent at 11/27/2016  4:46 PM EDT ----- CXR without PNA, no acute HF.  Symptoms should continue to improve.  Labs to follow.

## 2016-12-02 NOTE — Progress Notes (Signed)
Cardiology Office Note   Date:  12/03/2016   ID:  Miranda [name], DOB [birth year], MRN [id number]  PCP:  Hollace Kinnier, DO  Cardiologist:  Dr. Marlou Porch    Chief Complaint  Patient presents with  . Shortness of Breath      History of Present Illness: Miranda [name] is a 51 y.o. female who presents for 1 week follow up of acute on chronic systolic HF   She has a hx of chronic systolic HF, NICM, with previuos EF 15%.  In March 2015 EF was 50%.  On last visit inSept 2017 she was doing well on ACE and BB- and no longer on lasix.  Her EKG is chronically abnormal with T wave inversion in several   On cath in 2014 she had no CAD  Lasix was incresaed to 40 bIS for 3 days then  Back to 40 daly.  Pro BNP was elevated CXR was without acute finding no PNA + chronic cardiomegaly.  + borderline interstitial coarsening.  amoxicillin 250 TID for 1 week  HTN elevated  Today she is 50% improved but with lower dose of lasix still SOB.  Her labs were stable except pro bnp.  She is down 13 lbs.  Her echo is next week.  Her URI has improved and she is able to sleep at night.   No chest pain.    Past Medical History:  Diagnosis Date  . Anemia   . At risk for sudden cardiac death 11/12/12  . CHF - Combined Systolic + Diastolic Dysfunction. EF of 15-20% with Grade II diastolic dysfunction on echo 10/27/12 10/28/2012  . Fibroids   . Hypertension   . S/P cardiac catheterization, November 12, 2012, normal coronaries with minimal luminal irregularities in RCA system 10/30/2012  . Thyroid disease    hypothyroidism    Past Surgical History:  Procedure Laterality Date  . LEFT AND RIGHT HEART CATHETERIZATION WITH CORONARY ANGIOGRAM N/A 11/12/2012   Procedure: LEFT AND RIGHT HEART CATHETERIZATION WITH CORONARY ANGIOGRAM;  Surgeon: Leonie Man, MD;  Location: [facility] CATH LAB;  Service: Cardiovascular;  Laterality: N/A;     Current Outpatient Prescriptions  Medication Sig Dispense Refill  . acetaminophen (TYLENOL) 500  MG tablet Take 500 mg by mouth as needed for headache (pain).     Marland Kitchen amoxicillin (AMOXIL) 250 MG capsule Take 1 capsule (250 mg total) by mouth 3 (three) times daily. Till finished 15 capsule 0  . aspirin EC 81 MG EC tablet Take 1 tablet (81 mg total) by mouth daily.    Marland Kitchen atorvastatin (LIPITOR) 10 MG tablet TAKE ONE TABLET BY MOUTH ONCE DAILY AT 6 PM 90 tablet 3  . buPROPion (WELLBUTRIN XL) 150 MG 24 hr tablet TAKE ONE TABLET BY MOUTH ONCE DAILY 30 tablet 5  . carvedilol (COREG) 6.25 MG tablet Take 1 tablet (6.25 mg total) by mouth 2 (two) times daily with a meal. 180 tablet 3  . Ferrous Fumarate (HEMOCYTE - 106 MG FE) 324 (106 Fe) MG TABS tablet Take 1 tablet (106 mg of iron total) by mouth 2 (two) times daily. 180 tablet 3  . furosemide (LASIX) 20 MG tablet Take 2 tablets (40 mg total) by mouth daily. 60 tablet 0  . levothyroxine (SYNTHROID, LEVOTHROID) 112 MCG tablet TAKE ONE TABLET BY MOUTH ONCE DAILY BEFORE BREAKFAST 30 tablet 4  . lisinopril (PRINIVIL,ZESTRIL) 2.5 MG tablet Take 1 tablet (2.5 mg total) by mouth 2 (two) times daily. 180 tablet 3  . mirtazapine (REMERON) 7.5 MG tablet  Take 1 tablet (7.5 mg total) by mouth at bedtime. 90 tablet 3  . potassium chloride SA (K-DUR,KLOR-CON) 20 MEQ tablet Take 2 tablets (40 mEq total) by mouth daily. 60 tablet 0  . traZODone (DESYREL) 150 MG tablet Take 1 tablet (150 mg total) by mouth at bedtime. 90 tablet 3   No current facility-administered medications for this visit.     Allergies:   Codeine    Social History:  The patient  reports that she quit smoking about 38 years ago. Her smoking use included Cigars and Cigarettes. She has a 2.50 pack-year smoking history. She has never used smokeless tobacco. She reports that she drinks about 1.8 oz of alcohol per week . She reports that she uses drugs, including Marijuana, about 5 times per week.   Family History:  The patient's family history includes Arthritis in her paternal uncle; Autoimmune  disease in her mother; Breast cancer in her cousin; Cancer in her maternal uncle and paternal grandfather; Hypertension in her father, maternal grandmother, and mother; Stroke in her father.    ROS:  General:no colds or fevers, + weight loss Skin:no rashes or ulcers HEENT:no blurred vision, no congestion CV:see HPI PUL:see HPI GI:no diarrhea constipation or melena, no indigestion GU:no hematuria, no dysuria MS:no joint pain, no claudication Neuro:no syncope, no lightheadedness Endo:no diabetes, + thyroid disease  Wt Readings from Last 3 Encounters:  12/03/16 131 lb 8 oz (59.6 kg)  11/27/16 137 lb 12.8 oz (62.5 kg)  11/07/16 144 lb (65.3 kg)     PHYSICAL EXAM: VS:  BP 140/74   Pulse 65   Ht 5' 4.5" (1.638 m)   Wt 131 lb 8 oz (59.6 kg)   LMP 01/17/2015   SpO2 99%   BMI 22.22 kg/m  , BMI Body mass index is 22.22 kg/m. General:Pleasant affect, NAD Skin:Warm and dry, brisk capillary refill HEENT:normocephalic, sclera clear, mucus membranes moist Neck:supple, no JVD, no bruits  Heart:S1S2 RRR without murmur, gallup, rub or click Lungs:clear without rales, rhonchi, or wheezes YQM:VHQI, non tender, + BS, do not palpate liver spleen or masses Ext:tr to 1+ lower ext edema, 2+ pedal pulses, 2+ radial pulses Neuro:alert and oriented X 3, MAE, follows commands, + facial symmetry    EKG:  EKG is NOT ordered today.    Recent Labs: 01/25/2016: ALT 18 09/26/2016: Hemoglobin 11.9; TSH 3.62 11/27/2016: BUN 11; Creatinine, Ser 0.71; NT-Pro BNP 5,337; Platelets 232; Potassium 3.9; Sodium 144    Lipid Panel    Component Value Date/Time   CHOL 163 09/26/2016 1019   CHOL 169 01/25/2016 1019   TRIG 54 09/26/2016 1019   HDL 60 09/26/2016 1019   HDL 49 01/25/2016 1019   CHOLHDL 2.7 09/26/2016 1019   VLDL 11 09/26/2016 1019   LDLCALC 92 09/26/2016 1019   LDLCALC 89 01/25/2016 1019       Other studies Reviewed: Additional studies/ records that were reviewed today include:  previous OV note.   ASSESSMENT AND PLAN:  1.  Acute on chronic systolic and diastolic HF  Improved.  Will change lasix to 40 mg daily and 60 mg on M and thurs.  Will recheck her BMP in 2 weeks, K+ will be 20 meq daily.  Discussed with Dr. Marlou Porch and plan will be to titrate meds up for HF and repeat Echo.  Increasing her lisinopril to 10 mg daily. Follow up in 3-4 weeks.   2.  URI resolved and now using flonase to help sleep at night.  3. HTN increasign the ACE  4.  HLD continue statin.    Current medicines are reviewed with the patient today.  The patient Has no concerns regarding medicines.  The following changes have been made:  See above Labs/ tests ordered today include:see above  Disposition:   FU:  see above  Signed, Cecilie Kicks, NP  12/03/2016 9:04 AM    [address], [place], [place], [place] Phone: [phone]; Fax: [phone]

## 2016-12-03 ENCOUNTER — Ambulatory Visit (INDEPENDENT_AMBULATORY_CARE_PROVIDER_SITE_OTHER): Payer: PPO | Admitting: Cardiology

## 2016-12-03 ENCOUNTER — Encounter: Payer: Self-pay | Admitting: Cardiology

## 2016-12-03 VITALS — BP 140/74 | HR 65 | Ht 64.5 in | Wt 131.5 lb

## 2016-12-03 DIAGNOSIS — I10 Essential (primary) hypertension: Secondary | ICD-10-CM | POA: Diagnosis not present

## 2016-12-03 DIAGNOSIS — R0602 Shortness of breath: Secondary | ICD-10-CM | POA: Diagnosis not present

## 2016-12-03 DIAGNOSIS — I5042 Chronic combined systolic (congestive) and diastolic (congestive) heart failure: Secondary | ICD-10-CM

## 2016-12-03 DIAGNOSIS — R9431 Abnormal electrocardiogram [ECG] [EKG]: Secondary | ICD-10-CM

## 2016-12-03 DIAGNOSIS — I5023 Acute on chronic systolic (congestive) heart failure: Secondary | ICD-10-CM

## 2016-12-03 LAB — BASIC METABOLIC PANEL
BUN/Creatinine Ratio: 19 (ref 9–23)
BUN: 15 mg/dL (ref 6–24)
CO2: 22 mmol/L (ref 18–29)
Calcium: 9.2 mg/dL (ref 8.7–10.2)
Chloride: 104 mmol/L (ref 96–106)
Creatinine, Ser: 0.8 mg/dL (ref 0.57–1.00)
GFR calc Af Amer: 97 mL/min/{1.73_m2} (ref >59)
GFR calc non Af Amer: 84 mL/min/{1.73_m2} (ref >59)
Glucose: 80 mg/dL (ref 65–99)
Potassium: 4.2 mmol/L (ref 3.5–5.2)
Sodium: 142 mmol/L (ref 134–144)

## 2016-12-03 MED ORDER — POTASSIUM CHLORIDE CRYS ER 20 MEQ PO TBCR: 20.0000 meq | EXTENDED_RELEASE_TABLET | Freq: Every day | ORAL | 3 refills | Status: DC

## 2016-12-03 MED ORDER — LISINOPRIL 10 MG PO TABS: 10.0000 mg | ORAL_TABLET | Freq: Every day | ORAL | 3 refills | Status: DC

## 2016-12-03 MED ORDER — FUROSEMIDE 40 MG PO TABS: 40.0000 mg | ORAL_TABLET | Freq: Every day | ORAL | 3 refills | Status: DC

## 2016-12-03 NOTE — Patient Instructions (Addendum)
Medication Instructions:    START TAKING  LASIX 40 MG ONCE A DAY   EXCEPT ON MON AND THURS TAKE A TABLET AND HALF ( 60 MG ) ONCE A DAY   START TAKING  LISINOPRIL 10 MG ONCE DAY   START TAKING POTASSIUM K-DUR 20 MEQ ONCE A DAY    If you need a refill on your cardiac medications before your next appointment, please call your pharmacy.  Labwork: BMET TODAY    Testing/Procedures: NONE ORDERED  TODAY   Follow-Up: IN 4 WEEKS WITH LAURA  INGOLD   IN 2 TO 3 MONTHS WITH DR Marlou Porch    Any Other Special Instructions Will Be Listed Below (If Applicable).

## 2016-12-10 ENCOUNTER — Other Ambulatory Visit: Payer: Self-pay

## 2016-12-10 ENCOUNTER — Ambulatory Visit (HOSPITAL_COMMUNITY): Payer: PPO | Attending: Cardiovascular Disease

## 2016-12-10 DIAGNOSIS — I083 Combined rheumatic disorders of mitral, aortic and tricuspid valves: Secondary | ICD-10-CM | POA: Diagnosis not present

## 2016-12-10 DIAGNOSIS — R0602 Shortness of breath: Secondary | ICD-10-CM | POA: Diagnosis not present

## 2016-12-10 DIAGNOSIS — R0600 Dyspnea, unspecified: Secondary | ICD-10-CM | POA: Insufficient documentation

## 2016-12-10 DIAGNOSIS — I313 Pericardial effusion (noninflammatory): Secondary | ICD-10-CM | POA: Insufficient documentation

## 2016-12-10 DIAGNOSIS — Z8679 Personal history of other diseases of the circulatory system: Secondary | ICD-10-CM | POA: Diagnosis not present

## 2016-12-23 ENCOUNTER — Telehealth: Payer: Self-pay | Admitting: *Deleted

## 2016-12-23 DIAGNOSIS — I351 Nonrheumatic aortic (valve) insufficiency: Secondary | ICD-10-CM

## 2016-12-23 NOTE — Telephone Encounter (Signed)
-----   Message from Isaiah Serge, NP sent at 12/16/2016  1:45 PM EDT ----- I discussed with Dr. Marlou Porch and pt needs to see TCTS.  I have discussed with pt her severe AR and her decreased EF.  Anderson Malta could you arrange appt with Dr. Cyndia Bent or Roxy Manns.  Thanks.

## 2016-12-31 ENCOUNTER — Ambulatory Visit: Payer: PPO | Admitting: Cardiology

## 2017-01-01 ENCOUNTER — Encounter: Payer: Self-pay | Admitting: Surgery

## 2017-01-01 ENCOUNTER — Institutional Professional Consult (permissible substitution) (INDEPENDENT_AMBULATORY_CARE_PROVIDER_SITE_OTHER): Payer: PPO | Admitting: Surgery

## 2017-01-01 VITALS — BP 163/87 | HR 88 | Resp 20 | Ht 64.5 in | Wt 128.0 lb

## 2017-01-01 DIAGNOSIS — I351 Nonrheumatic aortic (valve) insufficiency: Secondary | ICD-10-CM | POA: Diagnosis not present

## 2017-01-01 NOTE — Progress Notes (Signed)
Cardiothoracic Surgery Consultation   PCP is Gayland Curry, DO Referring Provider is Isaiah Serge, NP  Chief Complaint  Patient presents with  . Aortic Insuffiency    Surgical eval, ECHO 12/10/16,    HPI:  The patient is a 51 year old woman with hypertension, hypothyroidism, depression on meds and a history of non-ischemic cardiomyopathy dating back to 2014 when she presented with congestive heart failure and an echo 10/27/2016 showed an EF of 15-20% with diffuse hypokinesis, mild MR and no AS or AI. She had a cath at that time that showed no significant coronary disease. She was treated medically and a follow up echo in 01/2013 showed some improvement in the LVEF to 35-40%. Her next echo in 10/2013 showed further improvement in the EF to 50% with no AS or AI. There was mild aortic root dilation to 37 mm. She was seen in 04/2016 and was doing well on an ACE I and BB with no diuretic. She presented in April with sudden onset of shortness of breath and swelling in her legs and abdomen. She was started on lasix but her SOB did not improve until her lasix was titrated up for a few days. She had a follow up echo on 5/9 that showed new severe central AI with an drop in her LVEF to 30-35% with diffuse hypokinesis and grade 3 diastolic dysfunction. There was also moderate MR with a mildly restricted posterior leaflet and annular dilation. She says that she feels better than a few weeks ago but still has some shortness of breath and fatigue with exertion.  She lives alone and takes care of herself. She does not work and is on disability. She has depression on meds but it has been fairly well controlled and she has not been hospitalized for it. Her father is her closest family and is with her today but stayed in the lobby.   Past Medical History:  Diagnosis Date  . Anemia   . At risk for sudden cardiac death 11/10/2012  . CHF - Combined Systolic + Diastolic Dysfunction. EF of 15-20% with Grade II  diastolic dysfunction on echo 10/27/12 10/28/2012  . Fibroids   . Hypertension   . S/P cardiac catheterization, 2012-11-10, normal coronaries with minimal luminal irregularities in RCA system 10/30/2012  . Thyroid disease    hypothyroidism    Past Surgical History:  Procedure Laterality Date  . LEFT AND RIGHT HEART CATHETERIZATION WITH CORONARY ANGIOGRAM N/A Nov 10, 2012   Procedure: LEFT AND RIGHT HEART CATHETERIZATION WITH CORONARY ANGIOGRAM;  Surgeon: Leonie Man, MD;  Location: [facility] CATH LAB;  Service: Cardiovascular;  Laterality: N/A;    Family History  Problem Relation Age of Onset  . Cancer Paternal Grandfather        Bone  . Hypertension Mother   . Autoimmune disease Mother   . Stroke Father   . Hypertension Father   . Hypertension Maternal Grandmother   . Breast cancer Cousin   . Cancer Maternal Uncle        bone  . Arthritis Paternal Uncle     Social History Social History  Substance Use Topics  . Smoking status: Former Smoker    Packs/day: 0.50    Years: 5.00    Types: Cigars, Cigarettes    Quit date: 08/04/1978  . Smokeless tobacco: Never Used     Comment: 1 cigar daily-quit 10/24/12    . Alcohol use 1.8 oz/week    3 Glasses of wine per week  Comment: a week    Current Outpatient Prescriptions  Medication Sig Dispense Refill  . acetaminophen (TYLENOL) 500 MG tablet Take 500 mg by mouth as needed for headache (pain).     Marland Kitchen amoxicillin (AMOXIL) 250 MG capsule Take 1 capsule (250 mg total) by mouth 3 (three) times daily. Till finished 15 capsule 0  . aspirin EC 81 MG EC tablet Take 1 tablet (81 mg total) by mouth daily.    Marland Kitchen atorvastatin (LIPITOR) 10 MG tablet TAKE ONE TABLET BY MOUTH ONCE DAILY AT 6 PM 90 tablet 3  . buPROPion (WELLBUTRIN XL) 150 MG 24 hr tablet TAKE ONE TABLET BY MOUTH ONCE DAILY 30 tablet 5  . carvedilol (COREG) 6.25 MG tablet Take 1 tablet (6.25 mg total) by mouth 2 (two) times daily with a meal. 180 tablet 3  . Ferrous Fumarate (HEMOCYTE -  106 MG FE) 324 (106 Fe) MG TABS tablet Take 1 tablet (106 mg of iron total) by mouth 2 (two) times daily. 180 tablet 3  . furosemide (LASIX) 40 MG tablet Take 1 tablet (40 mg total) by mouth daily. EXCEPT ON MON. AND THURS. TAKE TABLET AND HALF 45 tablet 3  . levothyroxine (SYNTHROID, LEVOTHROID) 112 MCG tablet TAKE ONE TABLET BY MOUTH ONCE DAILY BEFORE BREAKFAST 30 tablet 4  . lisinopril (PRINIVIL,ZESTRIL) 10 MG tablet Take 1 tablet (10 mg total) by mouth daily. 30 tablet 3  . mirtazapine (REMERON) 7.5 MG tablet Take 1 tablet (7.5 mg total) by mouth at bedtime. 90 tablet 3  . potassium chloride SA (K-DUR,KLOR-CON) 20 MEQ tablet Take 1 tablet (20 mEq total) by mouth daily. 30 tablet 3  . traZODone (DESYREL) 150 MG tablet Take 1 tablet (150 mg total) by mouth at bedtime. 90 tablet 3   No current facility-administered medications for this visit.     Allergies  Allergen Reactions  . Codeine Hives    Review of Systems  Constitutional: Positive for activity change and fatigue.  HENT: Positive for dental problem.        Has some loose teeth and has not seen a dentist in years. No pain in teeth or jaw.  Eyes: Negative.   Respiratory: Negative.   Cardiovascular: Negative for chest pain.       Exertional shortness of breath  Had leg swelling but resolved with diuretic  Gastrointestinal: Negative.   Endocrine: Negative.   Genitourinary: Negative.   Musculoskeletal: Negative.   Allergic/Immunologic: Negative.   Neurological: Negative for dizziness and syncope.  Hematological: Negative.   Psychiatric/Behavioral:       Depression on meds.    BP (!) 163/87   Pulse 88   Resp 20   Ht 5' 4.5" (1.638 m)   Wt 128 lb (58.1 kg)   LMP 01/17/2015   SpO2 97% Comment: RA  BMI 21.63 kg/m  Physical Exam  Constitutional: She is oriented to person, place, and time. She appears well-developed and well-nourished. No distress.  HENT:  Head: Normocephalic and atraumatic.  Mouth/Throat: Oropharynx is  clear and moist.  Teeth look in fair condition  Eyes: EOM are normal. Pupils are equal, round, and reactive to light.  Neck: Normal range of motion. Neck supple. No JVD present. No thyromegaly present.  Cardiovascular: Normal rate, regular rhythm and intact distal pulses.   Murmur heard. 2/6 systolic murmur RSB and apex  3/6 diastolic AI murmur at apex  Pulmonary/Chest: Effort normal and breath sounds normal. No respiratory distress. She has no wheezes. She has no rales.  Abdominal: Soft. Bowel sounds are normal. She exhibits no distension. There is no tenderness.  Musculoskeletal: Normal range of motion. She exhibits no edema.  Lymphadenopathy:    She has no cervical adenopathy.  Neurological: She is alert and oriented to person, place, and time. She has normal strength. No cranial nerve deficit or sensory deficit.  Left/right nystagmus  Skin: Skin is warm and dry.  Psychiatric:  Flat affect    Diagnostic Tests:  Result status: Final result                           [facility]                        [address],  [address]                            [phone]  ------------------------------------------------------------------- Echocardiography  Patient:    Alexa, Favela MR #:       [id number] Study Date: 12/10/2016 Gender:     F Age:        81 Height:     163.8 cm Weight:     59.6 kg BSA:        1.65 m^2 Pt. Status: Room:   Aram Beecham  REFERRING    Cecilie Kicks R  SONOGRAPHER  Cindy Hazy, RDCS  REFERRING    [place], [place], Outpatient  ATTENDING    Skeet Latch, MD  cc:  ------------------------------------------------------------------- LV EF: 30% -   35%  ------------------------------------------------------------------- Indications:      R06.02 Shortness of Breath.  ------------------------------------------------------------------- History:    PMH:  Acquired from the patient and from the patient&'s chart.  PMH:  CHF. Anemia. Hypothyroidism.  Risk factors: Hypertension.  ------------------------------------------------------------------- Study Conclusions  - Left ventricle: The cavity size was mildly dilated. There was   moderate concentric hypertrophy. Systolic function was moderately   to severely reduced. The estimated ejection fraction was in the   range of 30% to 35%. of the inferolateral myocardium. Doppler   parameters are consistent with a reversible restrictive pattern,   indicative of decreased left ventricular diastolic compliance   and/or increased left atrial pressure (grade 3 diastolic   dysfunction). Doppler parameters are consistent with high   ventricular filling pressure. - Regional wall motion abnormality: Severe hypokinesis of the mid   inferolateral and apical lateral myocardium; moderate hypokinesis   of the entire anterior, entire inferior, basal inferolateral,   basal-mid anterolateral, and apical myocardium; mild hypokinesis   of the basal-mid anteroseptal, basal-mid inferoseptal, and apical   septal myocardium. - Aortic valve: Transvalvular velocity was within the normal range.   There was no stenosis. There was severe regurgitation originating   from the central coaptation point. Severe regurgitation is   suggested by a regurgitant jet width = 60% of the LVOT diameter,   holodiastolic flow reversal in the descending aorta, a dense   continuous wave Doppler signal, and a vena contracta = 6 mm.   Regurgitation pressure half-time: 253 ms. - Mitral valve: Unable to quantify mitral regurgitation. PISA   imaging was not obtained. Cannot use the continuity equation due   to severe aortic regurgitation.  Mobility of the posterior leaflet   was mildly restricted. Transvalvular velocity was within the   normal range. There was no evidence for stenosis. There was   moderate regurgitation. - Left atrium:  The appendage was severely dilated. - Right ventricle: Systolic function was moderately reduced. - Atrial septum: The septum bowed from left to right, consistent   with increased left atrial pressure. - Tricuspid valve: There was mild regurgitation. - Pulmonary arteries: Systolic pressure was within the normal   range. PA peak pressure: 29 mm Hg (S). - Pericardium, extracardiac: A trivial pericardial effusion was   identified.  ------------------------------------------------------------------- Labs, prior tests, procedures, and surgery: ECG.     Abnormal.  ------------------------------------------------------------------- Study data:   Study status:  Routine.  Procedure:  The patient reported no pain pre or post test. Transthoracic echocardiography for left ventricular function evaluation, for right ventricular function evaluation, and for assessment of valvular function. Image quality was adequate.  Study completion:  There were no complications.          Echocardiography.  M-mode, complete 2D, spectral Doppler, and color Doppler.  Birthdate:  Patient birthdate: 1965/12/18.  Age:  Patient is 51 yr old.  Sex:  Gender: female.    BMI: 22.2 kg/m^2.  Blood pressure:     140/74  Patient status:  Outpatient.  Study date:  Study date: 12/10/2016. Study time: 08:45 AM.  Location:  [facility]  -------------------------------------------------------------------  ------------------------------------------------------------------- Left ventricle:  The cavity size was mildly dilated. There was moderate concentric hypertrophy. Systolic function was moderately to severely reduced. The estimated ejection fraction was in the range of 30% to 35%.  Regional wall motion abnormalities:  of the inferolateral myocardium. Severe hypokinesis of the mid inferolateral and apical lateral myocardium; moderate hypokinesis of the entire anterior, entire inferior, basal inferolateral, basal-mid  anterolateral, and apical myocardium; mild hypokinesis of the basal-mid anteroseptal, basal-mid inferoseptal, and apical septal myocardium. Doppler parameters are consistent with a reversible restrictive pattern, indicative of decreased left ventricular diastolic compliance and/or increased left atrial pressure (grade 3 diastolic dysfunction). Doppler parameters are consistent with high ventricular filling pressure.  ------------------------------------------------------------------- Aortic valve:   Trileaflet; normal thickness leaflets. Mobility was not restricted.  Doppler:  Transvalvular velocity was within the normal range. There was no stenosis. There was severe regurgitation originating from the central coaptation point. Severe regurgitation is suggested by a regurgitant jet width = 60% of the LVOT diameter, holodiastolic flow reversal in the descending aorta, a dense continuous wave Doppler signal, and a vena contracta = 6 mm.  ------------------------------------------------------------------- Aorta:  Aortic root: The aortic root was normal in size. Ascending aorta: The ascending aorta was mildly dilated.  ------------------------------------------------------------------- Mitral valve:  Unable to quantify mitral regurgitation. PISA imaging was not obtained. Cannot use the continuity equation due to severe aortic regurgitation. Mobility of the posterior leaflet was mildly restricted.  Doppler:  Transvalvular velocity was within the normal range. There was no evidence for stenosis. There was moderate regurgitation.    Peak gradient (D): 5 mm Hg.  ------------------------------------------------------------------- Left atrium:  The atrium was normal in size. The appendage was severely dilated.  ------------------------------------------------------------------- Atrial septum:  The septum bowed from left to right, consistent with increased left atrial  pressure.  ------------------------------------------------------------------- Right ventricle:  The cavity size was normal. Wall thickness was normal. Systolic function was moderately reduced.  ------------------------------------------------------------------- Pulmonic valve:    Doppler:  Transvalvular velocity was within the normal range. There was no evidence for stenosis.  ------------------------------------------------------------------- Tricuspid valve:  Structurally normal valve.    Doppler: Transvalvular velocity was within the normal range. There was mild regurgitation.  ------------------------------------------------------------------- Pulmonary artery:   The main pulmonary artery was normal-sized. Systolic pressure was within the normal range.  ------------------------------------------------------------------- Right atrium:  The atrium was normal in size.  ------------------------------------------------------------------- Pericardium:  A trivial pericardial effusion was identified.  ------------------------------------------------------------------- Systemic veins: Inferior vena cava: The vessel was normal in size. The respirophasic diameter changes were in the normal range (= 50%), consistent with normal central venous pressure.  ------------------------------------------------------------------- Measurements   Left ventricle                           Value         Reference  LV ID, ED, PLAX chordal          (H)     55.5   mm     43 - 52  LV ID, ES, PLAX chordal          (H)     46.1   mm     23 - 38  LV fx shortening, PLAX chordal   (L)     17     %      >=29  LV PW thickness, ED                      12.8   mm     ---------  IVS/LV PW ratio, ED                      1.1           <=1.3  Stroke volume, 2D                        61     ml     ---------  Stroke volume/bsa, 2D                    37     ml/m^2 ---------  LV e&', lateral                            5.04   cm/s   ---------  LV E/e&', lateral                         21.43         ---------  LV e&', medial                            4.17   cm/s   ---------  LV E/e&', medial                          25.9          ---------  LV e&', average                           4.61   cm/s   ---------  LV E/e&', average                         23.45         ---------    Ventricular septum  Value         Reference  IVS thickness, ED                        14.1   mm     ---------    LVOT                                     Value         Reference  LVOT ID, S                               21     mm     ---------  LVOT area                                3.46   cm^2   ---------  LVOT ID                                  21     mm     ---------  LVOT peak velocity, S                    102    cm/s   ---------  LVOT mean velocity, S                    72.4   cm/s   ---------  LVOT VTI, S                              17.7   cm     ---------  LVOT peak gradient, S                    4      mm Hg  ---------  Stroke volume (SV), LVOT DP              61.3   ml     ---------  Stroke index (SV/bsa), LVOT DP           37.1   ml/m^2 ---------    Aortic valve                             Value         Reference  Aortic regurg peak velocity              516.79 cm/s   ---------  Aortic regurg deceleration               593    cm/s^2 ---------  Aortic regurg deceleration time          872    ms     ---------  Aortic regurg pressure half-time         253    ms     ---------  Aortic regurg peak gradient              107    mm Hg  ---------    Aorta  Value         Reference  Aortic root ID, ED                       36     mm     ---------  Ascending aorta ID, A-P, S               38     mm     ---------    Left atrium                              Value         Reference  LA ID, A-P, ES                           59     mm     ---------  LA ID/bsa, A-P                    (H)     3.57   cm/m^2 <=2.2  LA volume, S                             130    ml     ---------  LA volume/bsa, S                         78.8   ml/m^2 ---------  LA volume, ES, 1-p A4C                   113    ml     ---------  LA volume/bsa, ES, 1-p A4C               68.5   ml/m^2 ---------  LA volume, ES, 1-p A2C                   141    ml     ---------  LA volume/bsa, ES, 1-p A2C               85.4   ml/m^2 ---------    Mitral valve                             Value         Reference  Mitral E-wave peak velocity              108    cm/s   ---------  Mitral A-wave peak velocity              44.4   cm/s   ---------  Mitral deceleration time         (L)     123    ms     150 - 230  Mitral peak gradient, D                  5      mm Hg  ---------  Mitral E/A ratio, peak                   2.4           ---------    Pulmonary arteries  Value         Reference  PA pressure, S, DP                       29     mm Hg  <=30    Tricuspid valve                          Value         Reference  Tricuspid regurg peak velocity           256    cm/s   ---------  Tricuspid peak RV-RA gradient            26     mm Hg  ---------    Systemic veins                           Value         Reference  Estimated CVP                            3      mm Hg  ---------    Right ventricle                          Value         Reference  RV pressure, S, DP                       29     mm Hg  <=30  RV s&', lateral, S                        10.9   cm/s   ---------  Legend: (L)  and  (H)  mark values outside specified reference range.  ------------------------------------------------------------------- Prepared and Electronically Authenticated by  Skeet Latch, MD 2018-05-09T12:02:23    Impression:  This 51 year old woman has a history of non-ischemic cardiomyopathy acute systolic congestive heart failure dating back to 2014 when she presented with an EF of  15-20%. She had no valvular abnormality at that time. With medical treatment her EF improved to 50% in 10/2013 and she has been very stable until recently when she developed recurrent shortness of breath, fatigue, and swelling of her legs and abdomen that responded to diuretics. An echo now shows a drop in her LVEF to 30-35% with new severe central AI, moderate MR and moderate RV hypokinesis. Her symptoms are consistent with acute on chronic combined systolic and diastolic heart failure. I agree that AVR and possibly mitral valve annuloplasty is indicated to try to prevent progressive LV deterioration and worsening heart failure. I think a bioprosthetic valve is probably the best option for her. With depression on chronic medications, chronic cannabis use I think a mechanical valve and long term need for Coumadin would be risky. There is a higher risk of structural valve deterioration in her age group with a bioprosthetic valve but I think the risk of that is lower than the risk of long term need for Coumadin in this particular patient. She will need a cardiac cath preop since it has not been done since 2014. She has not seen a dentist in years and reports some loose teeth although they look ok grossly. I discussed her echo results,  my recommendations and the further workup with her and answered her limited questions. She seems to understand but was not engaging, looking at the floor and did not have much to say.  Plan:  We will arrange cardiac cath with cardiology and a dental evaluation by Dr. Enrique Sack. I will see her back after that to review results and discuss surgery again with her before scheduling.   I spent 60 minutes performing this consultation and > 50% of this time was spent face to face counseling and coordinating the care of this patient's severe aortic insufficiency, moderate MR and moderate to severe LV dysfunction.   Gaye Pollack, MD [facility] [phone]

## 2017-01-05 ENCOUNTER — Encounter: Payer: Self-pay | Admitting: Cardiology

## 2017-01-05 ENCOUNTER — Ambulatory Visit (INDEPENDENT_AMBULATORY_CARE_PROVIDER_SITE_OTHER): Payer: PPO | Admitting: Cardiology

## 2017-01-05 VITALS — BP 140/80 | HR 96 | Ht 64.5 in | Wt 134.0 lb

## 2017-01-05 DIAGNOSIS — I420 Dilated cardiomyopathy: Secondary | ICD-10-CM

## 2017-01-05 DIAGNOSIS — I351 Nonrheumatic aortic (valve) insufficiency: Secondary | ICD-10-CM | POA: Diagnosis not present

## 2017-01-05 DIAGNOSIS — I5042 Chronic combined systolic (congestive) and diastolic (congestive) heart failure: Secondary | ICD-10-CM

## 2017-01-05 DIAGNOSIS — R0602 Shortness of breath: Secondary | ICD-10-CM

## 2017-01-05 DIAGNOSIS — I061 Rheumatic aortic insufficiency: Secondary | ICD-10-CM | POA: Diagnosis not present

## 2017-01-05 DIAGNOSIS — Z01818 Encounter for other preprocedural examination: Secondary | ICD-10-CM | POA: Diagnosis not present

## 2017-01-05 NOTE — Patient Instructions (Signed)
Medication Instructions:  The current medical regimen is effective;  continue present plan and medications.  Labwork: Please have blood work (BMP, CBC and PT/INR)  Testing/Procedures: Your physician has requested that you have a cardiac catheterization. Cardiac catheterization is used to diagnose and/or treat various heart conditions. Doctors may recommend this procedure for a number of different reasons. The most common reason is to evaluate chest pain. Chest pain can be a symptom of coronary artery disease (CAD), and cardiac catheterization can show whether plaque is narrowing or blocking your heart's arteries. This procedure is also used to evaluate the valves, as well as measure the blood flow and oxygen levels in different parts of your heart. For further information please visit [email or web address]. Please follow instruction sheet, as given.  Follow-Up: Follow up in 2 months.   If you need a refill on your cardiac medications before your next appointment, please call your pharmacy.    [place] [facility] [address] [place] [address] Dept: [phone] Loc: [phone]  Miranda [name]  01/05/2017  You are scheduled for a Cath on Friday, June 8th  with Dr. Claiborne Billings.  1. Please arrive at the [facility] (Main Entrance A) at [facility]: [place], [place] [address] at 8:30 AM (two hours before your procedure to ensure your preparation). Free valet parking service is available.   Special note: Every effort is made to have your procedure done on time. Please understand that emergencies sometimes delay scheduled procedures.  2. Diet: Nothing to eat or drink after midnight.  3. Labs: Please have blood work today  4. Medication instructions in preparation for your procedure: You may hold your Furosemide the morning of your cath. On the morning of your procedure, take  your ASA.  You may use sips of water.  5. Plan for one night stay--bring personal belongings. 6. Bring a current list of your medications and current insurance cards. 7. You MUST have a responsible person to drive you home. 8. Someone MUST be with you the first 24 hours after you arrive home or your discharge will be delayed. 9. Please wear clothes that are easy to get on and off and wear slip-on shoes.  Thank you for allowing [place] to care for you!   -- [facility]

## 2017-01-05 NOTE — Progress Notes (Signed)
  Cardiology Office Note   Date:  01/05/2017   ID:  Miranda [name], DOB 12/05/1965, MRN [id number]  PCP:  Reed, Tiffany L, DO  Cardiologist:   Briley Sulton, MD     History of Present Illness: Miranda [name] is a 51 y.o. female here for chronic systolic heart failure, cardiomyopathy follow-up, nonischemic cardiomyopathy. With newly discovered severe aortic insufficiency. Previous ejection fraction was 15%.  EF improved to 50% in March 2015. She returned in 2018 with worsening shortness of breath, going up stairs for instance. An echocardiogram was then performed which showed severe aortic regurgitation and EF of 35%.   ACE inhibitor and carvedilol.  Her EKG at baseline is abnormal with T-wave inversion in multiple leads. This is no change from prior. She does not have LVH on echo. No high-risk symptoms such as syncope. Prior cardiac catheterization was normal with no coronary artery disease.  Her primary physician Dr. Reed, asked for her to be seen because of her prior cardiomyopathy. She used to see Dr. Bensimhon.     Past Medical History:  Diagnosis Date  . Anemia   . At risk for sudden cardiac death 10/29/2012  . CHF - Combined Systolic + Diastolic Dysfunction. EF of 15-20% with Grade II diastolic dysfunction on echo 10/27/12 10/28/2012  . Fibroids   . Hypertension   . S/P cardiac catheterization, 10/29/12, normal coronaries with minimal luminal irregularities in RCA system 10/30/2012  . Thyroid disease    hypothyroidism    Past Surgical History:  Procedure Laterality Date  . LEFT AND RIGHT HEART CATHETERIZATION WITH CORONARY ANGIOGRAM N/A 10/29/2012   Procedure: LEFT AND RIGHT HEART CATHETERIZATION WITH CORONARY ANGIOGRAM;  Surgeon: David W Harding, MD;  Location: [facility] CATH LAB;  Service: Cardiovascular;  Laterality: N/A;     Current Outpatient Prescriptions  Medication Sig Dispense Refill  . acetaminophen (TYLENOL) 500 MG tablet Take 500 mg by mouth as needed for headache (pain).      . aspirin EC 81 MG EC tablet Take 1 tablet (81 mg total) by mouth daily.    . atorvastatin (LIPITOR) 10 MG tablet TAKE ONE TABLET BY MOUTH ONCE DAILY AT 6 PM 90 tablet 3  . buPROPion (WELLBUTRIN XL) 150 MG 24 hr tablet TAKE ONE TABLET BY MOUTH ONCE DAILY 30 tablet 5  . carvedilol (COREG) 6.25 MG tablet Take 1 tablet (6.25 mg total) by mouth 2 (two) times daily with a meal. 180 tablet 3  . Ferrous Fumarate (HEMOCYTE - 106 MG FE) 324 (106 Fe) MG TABS tablet Take 1 tablet (106 mg of iron total) by mouth 2 (two) times daily. 180 tablet 3  . furosemide (LASIX) 40 MG tablet Take 1 tablet (40 mg total) by mouth daily. EXCEPT ON MON. AND THURS. TAKE TABLET AND HALF 45 tablet 3  . levothyroxine (SYNTHROID, LEVOTHROID) 112 MCG tablet TAKE ONE TABLET BY MOUTH ONCE DAILY BEFORE BREAKFAST 30 tablet 4  . lisinopril (PRINIVIL,ZESTRIL) 10 MG tablet Take 1 tablet (10 mg total) by mouth daily. 30 tablet 3  . mirtazapine (REMERON) 7.5 MG tablet Take 1 tablet (7.5 mg total) by mouth at bedtime. 90 tablet 3  . potassium chloride SA (K-DUR,KLOR-CON) 20 MEQ tablet Take 1 tablet (20 mEq total) by mouth daily. 30 tablet 3  . traZODone (DESYREL) 150 MG tablet Take 1 tablet (150 mg total) by mouth at bedtime. 90 tablet 3   No current facility-administered medications for this visit.     Allergies:   Codeine      Social History:  The patient  reports that she quit smoking about 38 years ago. Her smoking use included Cigars and Cigarettes. She has a 2.50 pack-year smoking history. She has never used smokeless tobacco. She reports that she drinks about 1.8 oz of alcohol per week . She reports that she uses drugs, including Marijuana, about 5 times per week.   Family History:  The patient's family history includes Arthritis in her paternal uncle; Autoimmune disease in her mother; Breast cancer in her cousin; Cancer in her maternal uncle and paternal grandfather; Hypertension in her father, maternal grandmother, and mother;  Stroke in her father.    ROS:  Please see the history of present illness.   Reduce shortness of breath, no bleeding, no syncope, no strokelike symptoms, no orthopnea  PHYSICAL EXAM: VS:  BP 140/80   Pulse 96   Ht 5' 4.5" (1.638 m)   Wt 134 lb (60.8 kg)   LMP 01/17/2015   BMI 22.65 kg/m  , BMI Body mass index is 22.65 kg/m. GEN: Well nourished, well developed, in no acute distress  HEENT: normal  Neck: no JVD, carotid bruits, or masses, strong carotid upstroke Cardiac: RRR; soft diastolic murmur, no rubs, or gallops,no edema  Respiratory:  clear to auscultation bilaterally, normal work of breathing GI: soft, nontender, nondistended, + BS MS: no deformity or atrophy  Skin: warm and dry, no rash Neuro:  Alert and Oriented x 3, Strength and sensation are intact Psych: depressedl affect    EKG:  Today 04/28/16-heart rate 72 bpm, sinus rhythm, T-wave inferior laterally inversion, LVH. Personally viewed-no significant change from prior 04/24/15-sinus rhythm, 78, T wave inversion in multiple leads, no change from prior. QT interval 408 ms. Personally viewed.  ECHO: 10/19/13-ejection fraction 50%, increased from prior of 15%. Now 35% again with severe AI in 2018   Recent Labs: 01/25/2016: ALT 18 09/26/2016: Hemoglobin 11.9; TSH 3.62 11/27/2016: NT-Pro BNP 5,337; Platelets 232 12/03/2016: BUN 15; Creatinine, Ser 0.80; Potassium 4.2; Sodium 142    Lipid Panel    Component Value Date/Time   CHOL 163 09/26/2016 1019   CHOL 169 01/25/2016 1019   TRIG 54 09/26/2016 1019   HDL 60 09/26/2016 1019   HDL 49 01/25/2016 1019   CHOLHDL 2.7 09/26/2016 1019   VLDL 11 09/26/2016 1019   LDLCALC 92 09/26/2016 1019   LDLCALC 89 01/25/2016 1019      Wt Readings from Last 3 Encounters:  01/05/17 134 lb (60.8 kg)  01/01/17 128 lb (58.1 kg)  12/03/16 131 lb 8 oz (59.6 kg)      Other studies Reviewed: Additional studies/ records that were reviewed today include: Labs, office notes, echo,  catheterization, EKG. Review of the above records demonstrates: As above  Echocardiogram 12/10/16 Left ventricle: The cavity size was mildly dilated. There was   moderate concentric hypertrophy. Systolic function was moderately   to severely reduced. The estimated ejection fraction was in the   range of 30% to 35%. of the inferolateral myocardium. Doppler   parameters are consistent with a reversible restrictive pattern,   indicative of decreased left ventricular diastolic compliance   and/or increased left atrial pressure (grade 3 diastolic   dysfunction). Doppler parameters are consistent with high   ventricular filling pressure. - Regional wall motion abnormality: Severe hypokinesis of the mid   inferolateral and apical lateral myocardium; moderate hypokinesis   of the entire anterior, entire inferior, basal inferolateral,   basal-mid anterolateral, and apical myocardium; mild hypokinesis   of the   basal-mid anteroseptal, basal-mid inferoseptal, and apical   septal myocardium. - Aortic valve: Transvalvular velocity was within the normal range.   There was no stenosis. There was severe regurgitation originating   from the central coaptation point. Severe regurgitation is   suggested by a regurgitant jet width = 60% of the LVOT diameter,   holodiastolic flow reversal in the descending aorta, a dense   continuous wave Doppler signal, and a vena contracta = 6 mm.   Regurgitation pressure half-time: 253 ms. - Mitral valve: Unable to quantify mitral regurgitation. PISA   imaging was not obtained. Cannot use the continuity equation due   to severe aortic regurgitation. Mobility of the posterior leaflet   was mildly restricted. Transvalvular velocity was within the   normal range. There was no evidence for stenosis. There was   moderate regurgitation. - Left atrium: The appendage was severely dilated. - Right ventricle: Systolic function was moderately reduced. - Atrial septum: The septum  bowed from left to right, consistent   with increased left atrial pressure. - Tricuspid valve: There was mild regurgitation. - Pulmonary arteries: Systolic pressure was within the normal   range. PA peak pressure: 29 mm Hg (S). - Pericardium, extracardiac: A trivial pericardial effusion was   identified.  ASSESSMENT AND PLAN:  Aortic regurgitation  - Severe on recent echocardiogram.  - Had consultation with Dr. Bartle.  - We will set up for right and left heart catheterization. Risks and benefits including stroke, heart attack, death, renal impairment discussed.  - Like during Dr. Bartle's visit, she does have a depressed affect.  Return of cardiomyopathy dilated cardiomyopathy  - Ejection fraction was 50% most recently but now is 35% again. May now be because of severe aortic regurgitation.  - Symptoms.   - On carvedilol, Lasix, lisinopril  - No current edema  Hyperlipidemia  - Atorvastatin 10 mg.  Essential hypertension  - Blood pressure  normal. Continue with current regimen.   Current medicines are reviewed at length with the patient today.  The patient does not have concerns regarding medicines.  The following changes have been made:    Labs/ tests ordered today include: none  Orders Placed This Encounter  Procedures  . Basic metabolic panel  . CBC  . Protime-INR     Disposition:   FU with Jayde Daffin in 2 month  Signed, Kelechi Orgeron, MD  01/05/2017 2:28 PM    [facility] [address], [place], [place]  [address] Phone: [phone]; Fax: [phone]  

## 2017-01-06 ENCOUNTER — Ambulatory Visit (HOSPITAL_COMMUNITY): Payer: Self-pay | Admitting: Dentistry

## 2017-01-06 ENCOUNTER — Encounter (HOSPITAL_COMMUNITY): Payer: Self-pay | Admitting: Dentistry

## 2017-01-06 VITALS — BP 134/61 | HR 77 | Temp 97.7°F

## 2017-01-06 DIAGNOSIS — K0889 Other specified disorders of teeth and supporting structures: Secondary | ICD-10-CM

## 2017-01-06 DIAGNOSIS — K029 Dental caries, unspecified: Secondary | ICD-10-CM

## 2017-01-06 DIAGNOSIS — Z01810 Encounter for preprocedural cardiovascular examination: Secondary | ICD-10-CM | POA: Diagnosis not present

## 2017-01-06 DIAGNOSIS — M264 Malocclusion, unspecified: Secondary | ICD-10-CM

## 2017-01-06 DIAGNOSIS — K036 Deposits [accretions] on teeth: Secondary | ICD-10-CM

## 2017-01-06 DIAGNOSIS — K045 Chronic apical periodontitis: Secondary | ICD-10-CM

## 2017-01-06 DIAGNOSIS — I351 Nonrheumatic aortic (valve) insufficiency: Secondary | ICD-10-CM | POA: Diagnosis not present

## 2017-01-06 DIAGNOSIS — K08409 Partial loss of teeth, unspecified cause, unspecified class: Secondary | ICD-10-CM

## 2017-01-06 DIAGNOSIS — K0530 Chronic periodontitis, unspecified: Secondary | ICD-10-CM

## 2017-01-06 DIAGNOSIS — Z01818 Encounter for other preprocedural examination: Secondary | ICD-10-CM

## 2017-01-06 DIAGNOSIS — K06020 Generalized gingival recession, unspecified: Secondary | ICD-10-CM

## 2017-01-06 DIAGNOSIS — K0820 Unspecified atrophy of edentulous alveolar ridge: Secondary | ICD-10-CM

## 2017-01-06 LAB — BASIC METABOLIC PANEL
BUN/Creatinine Ratio: 15 (ref 9–23)
BUN: 12 mg/dL (ref 6–24)
CO2: 24 mmol/L (ref 18–29)
Calcium: 9.3 mg/dL (ref 8.7–10.2)
Chloride: 103 mmol/L (ref 96–106)
Creatinine, Ser: 0.79 mg/dL (ref 0.57–1.00)
GFR calc Af Amer: 99 mL/min/{1.73_m2} (ref >59)
GFR calc non Af Amer: 86 mL/min/{1.73_m2} (ref >59)
Glucose: 98 mg/dL (ref 65–99)
Potassium: 4.1 mmol/L (ref 3.5–5.2)
Sodium: 143 mmol/L (ref 134–144)

## 2017-01-06 LAB — CBC
Hematocrit: 41.4 % (ref 34.0–46.6)
Hemoglobin: 13.1 g/dL (ref 11.1–15.9)
MCH: 27.6 pg (ref 26.6–33.0)
MCHC: 31.6 g/dL (ref 31.5–35.7)
MCV: 87 fL (ref 79–97)
Platelets: 216 10*3/uL (ref 150–379)
RBC: 4.74 x10E6/uL (ref 3.77–5.28)
RDW: 15.9 % — ABNORMAL HIGH (ref 12.3–15.4)
WBC: 6.1 10*3/uL (ref 3.4–10.8)

## 2017-01-06 LAB — PROTIME-INR
INR: 1.1 (ref 0.8–1.2)
Prothrombin Time: 11.6 s (ref 9.1–12.0)

## 2017-01-06 NOTE — Progress Notes (Signed)
DENTAL CONSULTATION  Date of Consultation:  01/06/2017 Patient Name:   Miranda [name] Date of Birth:   1966/01/03 Medical Record Number: [id number]  VITALS: LMP 01/17/2015   CHIEF COMPLAINT: Patient referred by Dr. Cyndia Bent for dental consultation.  HPI: Miranda [name] is a 51 year old female recently diagnosed with severe aortic insufficiency. Patient with anticipated aortic valve replacement and possible mitral valve repair with Dr. Cyndia Bent.  Patient is now seen as part of a medically necessary pre-heart valve surgery dental protocol examination to rule out dental infection that may affect the patient's systemic health and anticipated heart valve surgery.  The patient currently denies acute toothaches, swellings, or abscesses. Patient indicates that the upper left premolar is "loose".  Patient has not seen a dentist for "a long time".  Patient last saw Dr. Gerlene Burdock in  2013 for extraction of remaining lower teeth and insertion of immediate lower complete denture. Patient denies having an upper partial denture. Patient indicates that the lower complete denture "fits okay.  Patient  indicates she does have dental phobia and generalized anxiety.  PROBLEM LIST: Patient Active Problem List   Diagnosis Date Noted  . Aortic valve Insufficiency 01/06/2017    Priority: High  . Long-term use of high-risk medication 04/24/2015  . History of CHF (congestive heart failure) 04/24/2015  . Insomnia 01/04/2014  . Hot flashes 01/04/2014  . Hyperlipidemia 11/11/2013  . HTN (hypertension) 09/14/2013  . Cyst of brain 08/10/2013  . Obstructive airway disease ([place]) 06/01/2013  . Dizziness 05/02/2013  . Depression 03/28/2013  . Hypothyroidism 12/21/2012  . S/P cardiac catheterization, 11-13-2012, normal coronaries with minimal luminal irregularities in RCA system 10/30/2012  . At risk for sudden cardiac death 11-13-2012  . Uterine fibroid 10/26/2012  . Anemia - microcytic presumed secondary to menorrhagia  10/25/2012    PMH: Past Medical History:  Diagnosis Date  . Anemia   . At risk for sudden cardiac death November 13, 2012  . CHF - Combined Systolic + Diastolic Dysfunction. EF of 15-20% with Grade II diastolic dysfunction on echo 10/27/12 10/28/2012  . Fibroids   . Hypertension   . S/P cardiac catheterization, November 13, 2012, normal coronaries with minimal luminal irregularities in RCA system 10/30/2012  . Thyroid disease    hypothyroidism    PSH: Past Surgical History:  Procedure Laterality Date  . LEFT AND RIGHT HEART CATHETERIZATION WITH CORONARY ANGIOGRAM N/A 11/13/2012   Procedure: LEFT AND RIGHT HEART CATHETERIZATION WITH CORONARY ANGIOGRAM;  Surgeon: Leonie Man, MD;  Location: [facility] CATH LAB;  Service: Cardiovascular;  Laterality: N/A;    ALLERGIES: Allergies  Allergen Reactions  . Codeine Hives    MEDICATIONS: Current Outpatient Prescriptions  Medication Sig Dispense Refill  . acetaminophen (TYLENOL) 500 MG tablet Take 500 mg by mouth as needed for headache (pain).     Marland Kitchen aspirin EC 81 MG EC tablet Take 1 tablet (81 mg total) by mouth daily.    Marland Kitchen atorvastatin (LIPITOR) 10 MG tablet TAKE ONE TABLET BY MOUTH ONCE DAILY AT 6 PM 90 tablet 3  . buPROPion (WELLBUTRIN XL) 150 MG 24 hr tablet TAKE ONE TABLET BY MOUTH ONCE DAILY 30 tablet 5  . carvedilol (COREG) 6.25 MG tablet Take 1 tablet (6.25 mg total) by mouth 2 (two) times daily with a meal. 180 tablet 3  . Ferrous Fumarate (HEMOCYTE - 106 MG FE) 324 (106 Fe) MG TABS tablet Take 1 tablet (106 mg of iron total) by mouth 2 (two) times daily. 180 tablet 3  . furosemide (LASIX) 40 MG tablet  Take 1 tablet (40 mg total) by mouth daily. EXCEPT ON MON. AND THURS. TAKE TABLET AND HALF 45 tablet 3  . levothyroxine (SYNTHROID, LEVOTHROID) 112 MCG tablet TAKE ONE TABLET BY MOUTH ONCE DAILY BEFORE BREAKFAST 30 tablet 4  . lisinopril (PRINIVIL,ZESTRIL) 10 MG tablet Take 1 tablet (10 mg total) by mouth daily. 30 tablet 3  . mirtazapine (REMERON) 7.5 MG  tablet Take 1 tablet (7.5 mg total) by mouth at bedtime. 90 tablet 3  . potassium chloride SA (K-DUR,KLOR-CON) 20 MEQ tablet Take 1 tablet (20 mEq total) by mouth daily. 30 tablet 3  . traZODone (DESYREL) 150 MG tablet Take 1 tablet (150 mg total) by mouth at bedtime. 90 tablet 3   No current facility-administered medications for this visit.     LABS: Lab Results  Component Value Date   WBC 6.1 01/05/2017   HGB 11.9 09/26/2016   HCT 41.4 01/05/2017   MCV 87 01/05/2017   PLT 216 01/05/2017      Component Value Date/Time   NA 143 01/05/2017 1433   K 4.1 01/05/2017 1433   CL 103 01/05/2017 1433   CO2 24 01/05/2017 1433   GLUCOSE 98 01/05/2017 1433   GLUCOSE 79 09/26/2016 1019   BUN 12 01/05/2017 1433   CREATININE 0.79 01/05/2017 1433   CREATININE 0.86 09/26/2016 1019   CALCIUM 9.3 01/05/2017 1433   GFRNONAA 86 01/05/2017 1433   GFRAA 99 01/05/2017 1433   Lab Results  Component Value Date   INR 1.1 01/05/2017   INR 0.97 10/29/2012   INR 1.02 10/25/2012   No results found for: PTT  SOCIAL HISTORY: Social History   Social History  . Marital status: Single    Spouse name: N/A  . Number of children: 1  . Years of education: 12th   Occupational History  . 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 Smoking status: Former Smoker    Packs/day: 0.50    Years: 5.00    Types: Cigars, Cigarettes    Quit date: 08/04/1978  . Smokeless tobacco: Never Used     Comment: 1 cigar daily-quit 10/24/12    . Alcohol use 1.8 oz/week    3 Glasses of wine per week     Comment: a week  . Drug use: Yes    Frequency: 5.0 times per week    Types: Marijuana  . Sexual activity: Not Currently   Other Topics Concern  . Not on file   Social History Narrative   Patient lives at home alone. (Condo)   Caffeine use: 1/2 soda daily   Diet   Martial status:single   Is it one or more stories? Yes   How many persons live in your home? 1   Do you have any pets in your  home? No   Current or past profession: Biochemist, clinical   Do you exercise? No   Do you have a living will? No    Do you have a DNR form? No    Do you have signed POA/HPOA forms? No        FAMILY HISTORY: Family History  Problem Relation Age of Onset  . Cancer Paternal Grandfather        Bone  . Hypertension Mother   . Autoimmune disease Mother   . Stroke Father   . Hypertension Father   . Hypertension Maternal Grandmother   . Breast cancer Cousin   . Cancer Maternal Uncle  bone  . Arthritis Paternal Uncle     REVIEW OF SYSTEMS: Reviewed with the patient as per History of present illness. Psych: Patient Does have dental phobia and generalized anxiety. Patient also with a history of depression.   DENTAL HISTORY: CHIEF COMPLAINT: Patient referred by Dr. Cyndia Bent for dental consultation.  HPI: Miranda [name] is a 51 year old female recently diagnosed with severe aortic insufficiency. Patient with anticipated aortic valve replacement and possible mitral valve repair with Dr. Cyndia Bent.  Patient is now seen as part of a medically necessary pre-heart valve surgery dental protocol examination to rule out dental infection that may affect the patient's systemic health and anticipated heart valve surgery.  The patient currently denies acute toothaches, swellings, or abscesses. Patient indicates that the upper left premolar is "loose".  Patient has not seen a dentist for "a long time".  Patient last saw Dr. Gerlene Burdock in  2013 for extraction of remaining lower teeth and insertion of immediate lower complete denture. Patient denies having an upper partial denture. Patient indicates that the lower complete denture "fits okay.  Patient  indicates she does have dental phobia and generalized anxiety.  DENTAL EXAMINATION: GENERAL:The patient is a well-developed, well-nourished female in no acute distress.  HEAD AND NECK: There is no palpable neck lymphadenopathy. The patient denies having acute TMJ  symptoms.  INTRAORAL EXAM:Patient has normal saliva. There is no evidence of oral abscess formation. There is atrophy of the  edentulous alveolar ridges.   DENTITION:Patient is missing tooth numbers 1 through 3, 14 through 16, and 17 through 32.  PERIODONTAL:Patient has chronic, advanced periodontal disease with plaque and calculus accumulations, generalized gingival recession, and tooth mobility as per dental charting form. There is moderate to severe bone loss noted. There is significant tooth mobility associated with tooth number 13.  DENTAL CARIES/SUBOPTIMAL RESTORATIONS: Dental are noted as per dental charting form.   ENDODONTIC: The patient denies acute pulpitis symptoms. There is periapical pathology and radiolucency associated with the apices of tooth numbers 12 and 13.  CROWN AND BRIDGE:There are no crown or bridge restorations.  PROSTHODONTIC:Patient has a lower complete denture and no upper partial denture. The lower complete denture was fabricated by Dr. Gerlene Burdock in 2013. The lower denture has acceptable retention and stability.  OCCLUSION:The patient has a poor occlusal scheme secondary to multiple missing teeth,  multiple rotated teeth, and lack of replacement of all missing teeth with clinically acceptable dental prostheses.  RADIOGRAPHIC INTERPRETATION: Orthopantogram was taken and supplemented with periapical radiographs. There are multiple missing teeth. There is moderate to severe bone loss noted. There is periapical pathology and radiolucency associated with the apices of tooth numbers 12 and 13. Dental caries are noted.  ASSESSMENTS: 1. Severe aortic insufficiency 2. Pre-heart valve surgery dental protocol 3. Chronic apical periodontitis 4. Chronic periodontitis with bone loss 5. Gingival recession 6. Accretions 7. Loose teeth 8. Multiple missing teeth 9. Lower complete denture 10. Atrophy of the edentulous alveolar ridge 11. Poor occlusal scheme and malocclusion 12. Risk  for complications up to and including death with anticipated invasive dental procedures in the operative room with general anesthesia due to overall cardiovascular compromise  PLAN/RECOMMENDATIONS: 1. I discussed the risks, benefits, and complications of various treatment options with the patient in relationship to her  medical and dental conditions, anticipated aortic valve replacement and mitral valve repair, and risk for endocarditis. We discussed various treatment options to include no treatment, multiple extractions with alveoloplasty, pre-prosthetic surgery as indicated, periodontal therapy, dental restorations, root canal therapy,  crown and bridge therapy, implant therapy, and replacement of missing teeth as indicated. The patient currently wishes to proceed with extraction of remaining teeth with alveoloplasty in the operating room with general anesthesia.  This has been scheduled for 01/21/2017 at [facility] at 8:30 AM. The patient will then proceed with heart valve surgery at the discretion of Dr. Cyndia Bent after adequate healing. Patient will then follow-up with a dentist of her choice for fabrication of upper and lower complete dentures as indicated after adequate healing and once medically stable from the anticipated heart valve surgery.  2. Discussion of findings with medical team and coordination of future medical and dental care as needed.  I spent in excess of  120 minutes during the conduct of this consultation and >50% of this time involved direct face-to-face encounter for counseling and/or coordination of the patient's care.    Lenn Cal, DDS

## 2017-01-06 NOTE — Patient Instructions (Signed)
[facility]    Department of Dental Medicine     DR. KULINSKI      HEART VALVES AND MOUTH CARE:  FACTS:   If you have any infection in your mouth, it can infect your heart valve.  If you heart valve is infected, you will be seriously ill.  Infections in the mouth can be SILENT and do not always cause pain.  Examples of infections in the mouth are gum disease, dental cavities, and abscesses.  Some possible signs of infection are: Bad breath, bleeding gums, or teeth that are sensitive to sweets, hot, and/or cold. There are many other signs as well.  WHAT YOU HAVE TO DO:   Brush your teeth after meals and at bedtime. Spend at least 2 minutes brushing well, especially behind your back teeth and all around your teeth that stand alone. Brush at the gumline also.  Do not go to bed without brushing your teeth and flossing.  If you gums bleed when you brush or floss, do NOT stop brushing or flossing. It usually means that your gums need more attention and better cleaning.   If your Dentist or Dr. Kulinski gave you a prescription mouthwash to use, make sure to use it as directed. If you run out of the medication, get a refill at the pharmacy.   If you were given any other medications or directions by your Dentist, please follow them. If you did not understand the directions or forget what you were told, please call. We will be happy to refresh her memory.  If you need antibiotics before dental procedures, make sure you take them one hour prior to every dental visit as directed.   Get a dental checkup every 4-6 months in order to keep your mouth healthy, or to find and treat any new infection. You will most likely need your teeth cleaned or gums treated at the same time.  If you are not able to come in for your scheduled appointment, call your Dentist as soon as possible to reschedule.  If you have a problem in between dental visits, call your Dentist.  

## 2017-01-06 NOTE — Addendum Note (Signed)
Addended by: Teena Dunk F on: 01/06/2017 10:37 AM   Modules accepted: Orders, SmartSet

## 2017-01-07 ENCOUNTER — Ambulatory Visit: Payer: PPO | Admitting: Cardiology

## 2017-01-09 ENCOUNTER — Ambulatory Visit (HOSPITAL_COMMUNITY)
Admission: RE | Disposition: A | Discharge status: Home / Self Care | Payer: Self-pay | Source: Ambulatory Visit | Attending: Cardiovascular Disease

## 2017-01-09 ENCOUNTER — Ambulatory Visit (HOSPITAL_COMMUNITY)
Admission: RE | Admit: 2017-01-09 | Discharge: 2017-01-09 | Disposition: A | Discharge status: Home / Self Care | Payer: PPO | Source: Ambulatory Visit | Attending: Cardiovascular Disease | Admitting: Cardiovascular Disease

## 2017-01-09 DIAGNOSIS — E039 Hypothyroidism, unspecified: Secondary | ICD-10-CM | POA: Insufficient documentation

## 2017-01-09 DIAGNOSIS — Z7982 Long term (current) use of aspirin: Secondary | ICD-10-CM | POA: Diagnosis not present

## 2017-01-09 DIAGNOSIS — I5042 Chronic combined systolic (congestive) and diastolic (congestive) heart failure: Secondary | ICD-10-CM | POA: Insufficient documentation

## 2017-01-09 DIAGNOSIS — I351 Nonrheumatic aortic (valve) insufficiency: Secondary | ICD-10-CM | POA: Insufficient documentation

## 2017-01-09 DIAGNOSIS — I272 Pulmonary hypertension, unspecified: Secondary | ICD-10-CM | POA: Insufficient documentation

## 2017-01-09 DIAGNOSIS — Z87891 Personal history of nicotine dependence: Secondary | ICD-10-CM | POA: Insufficient documentation

## 2017-01-09 DIAGNOSIS — E785 Hyperlipidemia, unspecified: Secondary | ICD-10-CM | POA: Diagnosis not present

## 2017-01-09 DIAGNOSIS — I2510 Atherosclerotic heart disease of native coronary artery without angina pectoris: Secondary | ICD-10-CM | POA: Insufficient documentation

## 2017-01-09 DIAGNOSIS — I420 Dilated cardiomyopathy: Secondary | ICD-10-CM | POA: Diagnosis not present

## 2017-01-09 DIAGNOSIS — I110 Hypertensive heart disease with heart failure: Secondary | ICD-10-CM | POA: Insufficient documentation

## 2017-01-09 HISTORY — PX: RIGHT/LEFT HEART CATH AND CORONARY ANGIOGRAPHY: CATH118266

## 2017-01-09 LAB — POCT I-STAT 3, ART BLOOD GAS (G3+)
Acid-base deficit: 1 mmol/L (ref 0.0–2.0)
Bicarbonate: 24.2 mmol/L (ref 20.0–28.0)
O2 Saturation: 88 %
TCO2: 25 mmol/L (ref 0–100)
pCO2 arterial: 41 mmHg (ref 32.0–48.0)
pH, Arterial: 7.38 (ref 7.350–7.450)
pO2, Arterial: 56 mmHg — ABNORMAL LOW (ref 83.0–108.0)

## 2017-01-09 LAB — POCT I-STAT 3, VENOUS BLOOD GAS (G3P V)
Acid-base deficit: 1 mmol/L (ref 0.0–2.0)
Acid-base deficit: 2 mmol/L (ref 0.0–2.0)
Bicarbonate: 23.6 mmol/L (ref 20.0–28.0)
Bicarbonate: 24.6 mmol/L (ref 20.0–28.0)
O2 Saturation: 53 %
O2 Saturation: 57 %
TCO2: 25 mmol/L (ref 0–100)
TCO2: 26 mmol/L (ref 0–100)
pCO2, Ven: 41.6 mmHg — ABNORMAL LOW (ref 44.0–60.0)
pCO2, Ven: 42.4 mmHg — ABNORMAL LOW (ref 44.0–60.0)
pH, Ven: 7.361 (ref 7.250–7.430)
pH, Ven: 7.372 (ref 7.250–7.430)
pO2, Ven: 29 mmHg — CL (ref 32.0–45.0)
pO2, Ven: 31 mmHg — CL (ref 32.0–45.0)

## 2017-01-09 SURGERY — RIGHT/LEFT HEART CATH AND CORONARY ANGIOGRAPHY
Anesthesia: LOCAL

## 2017-01-09 MED ORDER — SODIUM CHLORIDE 0.9 % IV SOLN
INTRAVENOUS | Status: DC
  Administered 2017-01-09: 09:00:00 via INTRAVENOUS

## 2017-01-09 MED ORDER — SODIUM CHLORIDE 0.9 % IV SOLN: 250.0000 mL | INTRAVENOUS | Status: DC | PRN

## 2017-01-09 MED ORDER — MIDAZOLAM HCL 2 MG/2ML IJ SOLN
INTRAMUSCULAR | Status: DC | PRN
  Administered 2017-01-09: 2 mg via INTRAVENOUS
  Administered 2017-01-09: 1 mg via INTRAVENOUS

## 2017-01-09 MED ORDER — ATROPINE SULFATE 1 MG/10ML IJ SOSY
PREFILLED_SYRINGE | INTRAMUSCULAR | Status: AC
  Filled 2017-01-09: qty 10

## 2017-01-09 MED ORDER — FENTANYL CITRATE (PF) 100 MCG/2ML IJ SOLN
INTRAMUSCULAR | Status: AC
  Filled 2017-01-09: qty 2

## 2017-01-09 MED ORDER — LIDOCAINE HCL (PF) 1 % IJ SOLN
INTRAMUSCULAR | Status: AC
  Filled 2017-01-09: qty 30

## 2017-01-09 MED ORDER — SODIUM CHLORIDE 0.9% FLUSH: 3.0000 mL | Freq: Two times a day (BID) | INTRAVENOUS | Status: DC

## 2017-01-09 MED ORDER — SODIUM CHLORIDE 0.9% FLUSH: 3.0000 mL | INTRAVENOUS | Status: DC | PRN

## 2017-01-09 MED ORDER — FENTANYL CITRATE (PF) 100 MCG/2ML IJ SOLN
INTRAMUSCULAR | Status: DC | PRN
  Administered 2017-01-09 (×2): 25 ug via INTRAVENOUS

## 2017-01-09 MED ORDER — IOPAMIDOL (ISOVUE-370) INJECTION 76%
INTRAVENOUS | Status: AC
  Filled 2017-01-09: qty 100

## 2017-01-09 MED ORDER — HEPARIN (PORCINE) IN NACL 2-0.9 UNIT/ML-% IJ SOLN
INTRAMUSCULAR | Status: AC | PRN
  Administered 2017-01-09: 1000 mL

## 2017-01-09 MED ORDER — ASPIRIN 81 MG PO CHEW: 81.0000 mg | CHEWABLE_TABLET | ORAL | Status: DC

## 2017-01-09 MED ORDER — ONDANSETRON HCL 4 MG/2ML IJ SOLN: 4.0000 mg | Freq: Four times a day (QID) | INTRAMUSCULAR | Status: DC | PRN

## 2017-01-09 MED ORDER — IOPAMIDOL (ISOVUE-370) INJECTION 76%
INTRAVENOUS | Status: DC | PRN
  Administered 2017-01-09: 85 mL via INTRAVENOUS

## 2017-01-09 MED ORDER — ACETAMINOPHEN 325 MG PO TABS: 650.0000 mg | ORAL_TABLET | ORAL | Status: DC | PRN

## 2017-01-09 MED ORDER — HEPARIN (PORCINE) IN NACL 2-0.9 UNIT/ML-% IJ SOLN
INTRAMUSCULAR | Status: AC
  Filled 2017-01-09: qty 500

## 2017-01-09 MED ORDER — SODIUM CHLORIDE 0.9 % IV SOLN: INTRAVENOUS | Status: AC

## 2017-01-09 MED ORDER — MIDAZOLAM HCL 2 MG/2ML IJ SOLN
INTRAMUSCULAR | Status: AC
  Filled 2017-01-09: qty 2

## 2017-01-09 MED ORDER — LIDOCAINE HCL (PF) 1 % IJ SOLN
INTRAMUSCULAR | Status: DC | PRN
  Administered 2017-01-09: 2 mL via INTRADERMAL
  Administered 2017-01-09: 10 mL via INTRADERMAL
  Administered 2017-01-09: 2 mL via INTRADERMAL

## 2017-01-09 MED ORDER — NITROGLYCERIN 1 MG/10 ML FOR IR/CATH LAB
INTRA_ARTERIAL | Status: AC
  Filled 2017-01-09: qty 10

## 2017-01-09 MED ORDER — VERAPAMIL HCL 2.5 MG/ML IV SOLN
INTRAVENOUS | Status: AC
  Filled 2017-01-09: qty 2

## 2017-01-09 SURGICAL SUPPLY — 17 items
CATH BALLN WEDGE 5F 110CM (CATHETERS) ×2 IMPLANT
CATH INFINITI 5FR ANG PIGTAIL (CATHETERS) ×2 IMPLANT
CATH INFINITI 5FR JL4 (CATHETERS) ×2 IMPLANT
CATH INFINITI JR4 5F (CATHETERS) ×2 IMPLANT
DEVICE RAD COMP TR BAND LRG (VASCULAR PRODUCTS) ×2 IMPLANT
DEVICE RAD TR BAND REGULAR (VASCULAR PRODUCTS) ×2 IMPLANT
GLIDESHEATH SLEND SS 6F .021 (SHEATH) ×2 IMPLANT
GUIDEWIRE .025 260CM (WIRE) ×2 IMPLANT
KIT HEART LEFT (KITS) ×2 IMPLANT
PACK CARDIAC CATHETERIZATION (CUSTOM PROCEDURE TRAY) ×2 IMPLANT
SHEATH GLIDE SLENDER 4/5FR (SHEATH) ×4 IMPLANT
SHEATH PINNACLE 5F 10CM (SHEATH) ×2 IMPLANT
SYR MEDRAD MARK V 150ML (SYRINGE) ×2 IMPLANT
TRANSDUCER W/STOPCOCK (MISCELLANEOUS) ×4 IMPLANT
TUBING CIL FLEX 10 FLL-RA (TUBING) ×2 IMPLANT
WIRE EMERALD 3MM-J .035X150CM (WIRE) ×2 IMPLANT
WIRE HI TORQ VERSACORE-J 145CM (WIRE) ×2 IMPLANT

## 2017-01-09 NOTE — Discharge Instructions (Signed)
Angiogram, Care After °This sheet gives you information about how to care for yourself after your procedure. Your doctor may also give you more specific instructions. If you have problems or questions, contact your doctor. °Follow these instructions at home: °Insertion site care °· Follow instructions from your doctor about how to take care of your long, thin tube (catheter) insertion area. Make sure you: °? Wash your hands with soap and water before you change your bandage (dressing). If you cannot use soap and water, use hand sanitizer. °? Change your bandage as told by your doctor. °? Leave stitches (sutures), skin glue, or skin tape (adhesive) strips in place. They may need to stay in place for 2 weeks or longer. If tape strips get loose and curl up, you may trim the loose edges. Do not remove tape strips completely unless your doctor says it is okay. °· Do not take baths, swim, or use a hot tub until your doctor says it is okay. °· You may shower 24-48 hours after the procedure or as told by your doctor. °? Gently wash the area with plain soap and water. °? Pat the area dry with a clean towel. °? Do not rub the area. This may cause bleeding. °· Do not apply powder or lotion to the area. Keep the area clean and dry. °· Check your insertion area every day for signs of infection. Check for: °? More redness, swelling, or pain. °? Fluid or blood. °? Warmth. °? Pus or a bad smell. °Activity °· Rest as told by your doctor, usually for 1-2 days. °· Do not lift anything that is heavier than 10 lbs. (4.5 kg) or as told by your doctor. °· Do not drive for 24 hours if you were given a medicine to help you relax (sedative). °· Do not drive or use heavy machinery while taking prescription pain medicine. °General instructions °· Go back to your normal activities as told by your doctor, usually in about a week. Ask your doctor what activities are safe for you. °· If the insertion area starts to bleed, lie flat and put pressure  on the area. If the bleeding does not stop, get help right away. This is an emergency. °· Drink enough fluid to keep your pee (urine) clear or pale yellow. °· Take over-the-counter and prescription medicines only as told by your doctor. °· Keep all follow-up visits as told by your doctor. This is important. °Contact a doctor if: °· You have a fever. °· You have chills. °· You have more redness, swelling, or pain around your insertion area. °· You have fluid or blood coming from your insertion area. °· The insertion area feels warm to the touch. °· You have pus or a bad smell coming from your insertion area. °· You have more bruising around the insertion area. °· Blood collects in the tissue around the insertion area (hematoma) that may be painful to the touch. °Get help right away if: °· You have a lot of pain in the insertion area. °· The insertion area swells very fast. °· The insertion area is bleeding, and the bleeding does not stop after holding steady pressure on the area. °· The area near or just beyond the insertion area becomes pale, cool, tingly, or numb. °These symptoms may be an emergency. Do not wait to see if the symptoms will go away. Get medical help right away. Call your local emergency services (911 in the [place] Do not drive yourself to the hospital. °Summary °·   After the procedure, it is common to have bruising and tenderness at the long, thin tube insertion area. °· After the procedure, it is important to rest and drink plenty of fluids. °· Do not take baths, swim, or use a hot tub until your doctor says it is okay to do so. You may shower 24-48 hours after the procedure or as told by your doctor. °· If the insertion area starts to bleed, lie flat and put pressure on the area. If the bleeding does not stop, get help right away. This is an emergency. °This information is not intended to replace advice given to you by your health care provider. Make sure you discuss any questions you have with  your health care provider. °Document Released: 10/17/2008 Document Revised: 07/15/2016 Document Reviewed: 07/15/2016 °Elsevier Interactive Patient Education © 2017 [employer] ° ° °Radial Site Care °Refer to this sheet in the next few weeks. These instructions provide you with information about caring for yourself after your procedure. Your health care provider may also give you more specific instructions. Your treatment has been planned according to current medical practices, but problems sometimes occur. Call your health care provider if you have any problems or questions after your procedure. °What can I expect after the procedure? °After your procedure, it is typical to have the following: °· Bruising at the radial site that usually fades within 1-2 weeks. °· Blood collecting in the tissue (hematoma) that may be painful to the touch. It should usually decrease in size and tenderness within 1-2 weeks. ° °Follow these instructions at home: °· Take medicines only as directed by your health care provider. °· You may shower 24-48 hours after the procedure or as directed by your health care provider. Remove the bandage (dressing) and gently wash the site with plain soap and water. Pat the area dry with a clean towel. Do not rub the site, because this may cause bleeding. °· Do not take baths, swim, or use a hot tub until your health care provider approves. °· Check your insertion site every day for redness, swelling, or drainage. °· Do not apply powder or lotion to the site. °· Do not flex or bend the affected arm for 24 hours or as directed by your health care provider. °· Do not push or pull heavy objects with the affected arm for 24 hours or as directed by your health care provider. °· Do not lift over 10 lb (4.5 kg) for 5 days after your procedure or as directed by your health care provider. °· Ask your health care provider when it is okay to: °? Return to work or school. °? Resume usual physical activities or  sports. °? Resume sexual activity. °· Do not drive home if you are discharged the same day as the procedure. Have someone else drive you. °· You may drive 24 hours after the procedure unless otherwise instructed by your health care provider. °· Do not operate machinery or power tools for 24 hours after the procedure. °· If your procedure was done as an outpatient procedure, which means that you went home the same day as your procedure, a responsible adult should be with you for the first 24 hours after you arrive home. °· Keep all follow-up visits as directed by your health care provider. This is important. °Contact a health care provider if: °· You have a fever. °· You have chills. °· You have increased bleeding from the radial site. Hold pressure on the site. °Get help   right away if: °· You have unusual pain at the radial site. °· You have redness, warmth, or swelling at the radial site. °· You have drainage (other than a small amount of blood on the dressing) from the radial site. °· The radial site is bleeding, and the bleeding does not stop after 30 minutes of holding steady pressure on the site. °· Your arm or hand becomes pale, cool, tingly, or numb. °This information is not intended to replace advice given to you by your health care provider. Make sure you discuss any questions you have with your health care provider. °Document Released: 08/23/2010 Document Revised: 12/27/2015 Document Reviewed: 02/06/2014 °Elsevier Interactive Patient Education © 2018 Elsevier Inc. ° °

## 2017-01-09 NOTE — Interval H&P Note (Signed)
History and Physical Interval Note:  01/09/2017 10:11 AM  Miranda [name]  has presented today for surgery, with the diagnosis of ar  cardiomyopathy  The various methods of treatment have been discussed with the patient and family. After consideration of risks, benefits and other options for treatment, the patient has consented to  Procedure(s): Right/Left Heart Cath and Coronary Angiography (N/A) as a surgical intervention .  The patient's history has been reviewed, patient examined, no change in status, stable for surgery.  I have reviewed the patient's chart and labs.  Questions were answered to the patient's satisfaction.     Shelva Majestic

## 2017-01-09 NOTE — Progress Notes (Signed)
Site area: rt groin Site prior: level 0 Manual Pressure applied for: 20 min Patient Status During pull: Calm, sleeping Post Pull site: level 0 Post pull instructions given Post pull pulses present: DP and PT +2 Dressing applied Bedrest begins at: 12:15 Comments: No complications, patient calm, understands post sheath pull instructions.  Care handed over to [facility], holding area nurse for brachial venous sheath pull.

## 2017-01-09 NOTE — H&P (View-Only) (Signed)
Cardiology Office Note   Date:  01/05/2017   ID:  Miranda [name], DOB November 18, 1965, MRN [id number]  PCP:  Gayland Curry, DO  Cardiologist:   Candee Furbish, MD     History of Present Illness: Miranda [name] is a 51 y.o. female here for chronic systolic heart failure, cardiomyopathy follow-up, nonischemic cardiomyopathy. With newly discovered severe aortic insufficiency. Previous ejection fraction was 15%.  EF improved to 50% in March 2015. She returned in 2018 with worsening shortness of breath, going up stairs for instance. An echocardiogram was then performed which showed severe aortic regurgitation and EF of 35%.   ACE inhibitor and carvedilol.  Her EKG at baseline is abnormal with T-wave inversion in multiple leads. This is no change from prior. She does not have LVH on echo. No high-risk symptoms such as syncope. Prior cardiac catheterization was normal with no coronary artery disease.  Her primary physician Dr. Mariea Clonts, asked for her to be seen because of her prior cardiomyopathy. She used to see Dr. Haroldine Laws.     Past Medical History:  Diagnosis Date  . Anemia   . At risk for sudden cardiac death 2012/11/07  . CHF - Combined Systolic + Diastolic Dysfunction. EF of 15-20% with Grade II diastolic dysfunction on echo 10/27/12 10/28/2012  . Fibroids   . Hypertension   . S/P cardiac catheterization, Nov 07, 2012, normal coronaries with minimal luminal irregularities in RCA system 10/30/2012  . Thyroid disease    hypothyroidism    Past Surgical History:  Procedure Laterality Date  . LEFT AND RIGHT HEART CATHETERIZATION WITH CORONARY ANGIOGRAM N/A 11/07/2012   Procedure: LEFT AND RIGHT HEART CATHETERIZATION WITH CORONARY ANGIOGRAM;  Surgeon: Leonie Man, MD;  Location: [facility] CATH LAB;  Service: Cardiovascular;  Laterality: N/A;     Current Outpatient Prescriptions  Medication Sig Dispense Refill  . acetaminophen (TYLENOL) 500 MG tablet Take 500 mg by mouth as needed for headache (pain).      Marland Kitchen aspirin EC 81 MG EC tablet Take 1 tablet (81 mg total) by mouth daily.    Marland Kitchen atorvastatin (LIPITOR) 10 MG tablet TAKE ONE TABLET BY MOUTH ONCE DAILY AT 6 PM 90 tablet 3  . buPROPion (WELLBUTRIN XL) 150 MG 24 hr tablet TAKE ONE TABLET BY MOUTH ONCE DAILY 30 tablet 5  . carvedilol (COREG) 6.25 MG tablet Take 1 tablet (6.25 mg total) by mouth 2 (two) times daily with a meal. 180 tablet 3  . Ferrous Fumarate (HEMOCYTE - 106 MG FE) 324 (106 Fe) MG TABS tablet Take 1 tablet (106 mg of iron total) by mouth 2 (two) times daily. 180 tablet 3  . furosemide (LASIX) 40 MG tablet Take 1 tablet (40 mg total) by mouth daily. EXCEPT ON MON. AND THURS. TAKE TABLET AND HALF 45 tablet 3  . levothyroxine (SYNTHROID, LEVOTHROID) 112 MCG tablet TAKE ONE TABLET BY MOUTH ONCE DAILY BEFORE BREAKFAST 30 tablet 4  . lisinopril (PRINIVIL,ZESTRIL) 10 MG tablet Take 1 tablet (10 mg total) by mouth daily. 30 tablet 3  . mirtazapine (REMERON) 7.5 MG tablet Take 1 tablet (7.5 mg total) by mouth at bedtime. 90 tablet 3  . potassium chloride SA (K-DUR,KLOR-CON) 20 MEQ tablet Take 1 tablet (20 mEq total) by mouth daily. 30 tablet 3  . traZODone (DESYREL) 150 MG tablet Take 1 tablet (150 mg total) by mouth at bedtime. 90 tablet 3   No current facility-administered medications for this visit.     Allergies:   Codeine  Social History:  The patient  reports that she quit smoking about 38 years ago. Her smoking use included Cigars and Cigarettes. She has a 2.50 pack-year smoking history. She has never used smokeless tobacco. She reports that she drinks about 1.8 oz of alcohol per week . She reports that she uses drugs, including Marijuana, about 5 times per week.   Family History:  The patient's family history includes Arthritis in her paternal uncle; Autoimmune disease in her mother; Breast cancer in her cousin; Cancer in her maternal uncle and paternal grandfather; Hypertension in her father, maternal grandmother, and mother;  Stroke in her father.    ROS:  Please see the history of present illness.   Reduce shortness of breath, no bleeding, no syncope, no strokelike symptoms, no orthopnea  PHYSICAL EXAM: VS:  BP 140/80   Pulse 96   Ht 5' 4.5" (1.638 m)   Wt 134 lb (60.8 kg)   LMP 01/17/2015   BMI 22.65 kg/m  , BMI Body mass index is 22.65 kg/m. GEN: Well nourished, well developed, in no acute distress  HEENT: normal  Neck: no JVD, carotid bruits, or masses, strong carotid upstroke Cardiac: RRR; soft diastolic murmur, no rubs, or gallops,no edema  Respiratory:  clear to auscultation bilaterally, normal work of breathing GI: soft, nontender, nondistended, + BS MS: no deformity or atrophy  Skin: warm and dry, no rash Neuro:  Alert and Oriented x 3, Strength and sensation are intact Psych: depressedl affect    EKG:  Today 04/28/16-heart rate 72 bpm, sinus rhythm, T-wave inferior laterally inversion, LVH. Personally viewed-no significant change from prior 04/24/15-sinus rhythm, 78, T wave inversion in multiple leads, no change from prior. QT interval 408 ms. Personally viewed.  ECHO: 10/19/13-ejection fraction 50%, increased from prior of 15%. Now 35% again with severe AI in 2018   Recent Labs: 01/25/2016: ALT 18 09/26/2016: Hemoglobin 11.9; TSH 3.62 11/27/2016: NT-Pro BNP 5,337; Platelets 232 12/03/2016: BUN 15; Creatinine, Ser 0.80; Potassium 4.2; Sodium 142    Lipid Panel    Component Value Date/Time   CHOL 163 09/26/2016 1019   CHOL 169 01/25/2016 1019   TRIG 54 09/26/2016 1019   HDL 60 09/26/2016 1019   HDL 49 01/25/2016 1019   CHOLHDL 2.7 09/26/2016 1019   VLDL 11 09/26/2016 1019   LDLCALC 92 09/26/2016 1019   LDLCALC 89 01/25/2016 1019      Wt Readings from Last 3 Encounters:  01/05/17 134 lb (60.8 kg)  01/01/17 128 lb (58.1 kg)  12/03/16 131 lb 8 oz (59.6 kg)      Other studies Reviewed: Additional studies/ records that were reviewed today include: Labs, office notes, echo,  catheterization, EKG. Review of the above records demonstrates: As above  Echocardiogram 12/10/16 Left ventricle: The cavity size was mildly dilated. There was   moderate concentric hypertrophy. Systolic function was moderately   to severely reduced. The estimated ejection fraction was in the   range of 30% to 35%. of the inferolateral myocardium. Doppler   parameters are consistent with a reversible restrictive pattern,   indicative of decreased left ventricular diastolic compliance   and/or increased left atrial pressure (grade 3 diastolic   dysfunction). Doppler parameters are consistent with high   ventricular filling pressure. - Regional wall motion abnormality: Severe hypokinesis of the mid   inferolateral and apical lateral myocardium; moderate hypokinesis   of the entire anterior, entire inferior, basal inferolateral,   basal-mid anterolateral, and apical myocardium; mild hypokinesis   of the  basal-mid anteroseptal, basal-mid inferoseptal, and apical   septal myocardium. - Aortic valve: Transvalvular velocity was within the normal range.   There was no stenosis. There was severe regurgitation originating   from the central coaptation point. Severe regurgitation is   suggested by a regurgitant jet width = 60% of the LVOT diameter,   holodiastolic flow reversal in the descending aorta, a dense   continuous wave Doppler signal, and a vena contracta = 6 mm.   Regurgitation pressure half-time: 253 ms. - Mitral valve: Unable to quantify mitral regurgitation. PISA   imaging was not obtained. Cannot use the continuity equation due   to severe aortic regurgitation. Mobility of the posterior leaflet   was mildly restricted. Transvalvular velocity was within the   normal range. There was no evidence for stenosis. There was   moderate regurgitation. - Left atrium: The appendage was severely dilated. - Right ventricle: Systolic function was moderately reduced. - Atrial septum: The septum  bowed from left to right, consistent   with increased left atrial pressure. - Tricuspid valve: There was mild regurgitation. - Pulmonary arteries: Systolic pressure was within the normal   range. PA peak pressure: 29 mm Hg (S). - Pericardium, extracardiac: A trivial pericardial effusion was   identified.  ASSESSMENT AND PLAN:  Aortic regurgitation  - Severe on recent echocardiogram.  - Had consultation with Dr. Cyndia Bent.  - We will set up for right and left heart catheterization. Risks and benefits including stroke, heart attack, death, renal impairment discussed.  - Like during Dr. Vivi Martens visit, she does have a depressed affect.  Return of cardiomyopathy dilated cardiomyopathy  - Ejection fraction was 50% most recently but now is 35% again. May now be because of severe aortic regurgitation.  - Symptoms.   - On carvedilol, Lasix, lisinopril  - No current edema  Hyperlipidemia  - Atorvastatin 10 mg.  Essential hypertension  - Blood pressure  normal. Continue with current regimen.   Current medicines are reviewed at length with the patient today.  The patient does not have concerns regarding medicines.  The following changes have been made:    Labs/ tests ordered today include: none  Orders Placed This Encounter  Procedures  . Basic metabolic panel  . CBC  . Protime-INR     Disposition:   FU with Oshen Wlodarczyk in 2 month  Signed, Candee Furbish, MD  01/05/2017 2:28 PM    [facility] [place], [place], [place]  [address] Phone: [phone]; Fax: [phone]

## 2017-01-09 NOTE — Progress Notes (Signed)
Site area: rt ac venous sheath Site Prior to Removal:  Level 0 Pressure Applied For:  10 minutes Manual:   yes Patient Status During Pull:  stable Post Pull Site:  Level 0 Post Pull Instructions Given:  yes Post Pull Pulses Present: rt radial 2+ Dressing Applied:  Gauze and tegaderm Bedrest begins @ na Comments:

## 2017-01-12 ENCOUNTER — Encounter (HOSPITAL_COMMUNITY): Payer: Self-pay | Admitting: Cardiovascular Disease

## 2017-01-12 MED FILL — Nitroglycerin IV Soln 100 MCG/ML in D5W: INTRA_ARTERIAL | Qty: 10 | Status: AC

## 2017-01-12 MED FILL — Verapamil HCl IV Soln 2.5 MG/ML: INTRAVENOUS | Qty: 2 | Status: AC

## 2017-01-13 NOTE — Pre-Procedure Instructions (Signed)
Miranda [name]  01/13/2017      [facility] [place] [address] [place] ([place]) [place] [address] Phone: [phone] Fax: [phone]  [id number] [place], [name] [address] Phone: [phone] Fax: [phone]    Your procedure is scheduled on June 20.  Report to [facility] Admitting at 630 A.M.  Call this number if you have problems the morning of surgery:  [phone]   Remember:  Do not eat food or drink liquids after midnight.  Take these medicines the morning of surgery with A SIP OF WATER Bupropion (wellbutrin XL), Carvedilol (Coreg), Flonase spray if needed, Levothyroxine (Synthroid)  Stop taking Ibuprofen, Advil, motrin, Vitamins, Herbal medications, Aleve, BC's, Goody's  Stop aspirin as directed by your Dr.   Lazaro Arms not wear jewelry, make-up or nail polish.  Do not wear lotions, powders, or perfumes, or deoderant.  Do not shave 48 hours prior to surgery.  Men may shave face and neck.  Do not bring valuables to the hospital.  [facility] is not responsible for any belongings or valuables.  Contacts, dentures or bridgework may not be worn into surgery.  Leave your suitcase in the car.  After surgery it may be brought to your room.  For patients admitted to the hospital, discharge time will be determined by your treatment team.  Patients discharged the day of surgery will not be allowed to drive home.   Special instructions:  [facility] - Preparing for Surgery  Before surgery, you can play an important role.  Because skin is not sterile, your skin needs to be as free of germs as possible.  You can reduce the number of germs on you skin by washing with CHG (chlorahexidine gluconate) soap before surgery.  CHG is an antiseptic cleaner which kills germs and bonds with the skin to continue killing germs even after washing.  Please DO NOT use if you  have an allergy to CHG or antibacterial soaps.  If your skin becomes reddened/irritated stop using the CHG and inform your nurse when you arrive at Short Stay.  Do not shave (including legs and underarms) for at least 48 hours prior to the first CHG shower.  You may shave your face.  Please follow these instructions carefully:   1.  Shower with CHG Soap the night before surgery and the   morning of Surgery.  2.  If you choose to wash your hair, wash your hair first as usual with your  normal shampoo.  3.  After you shampoo, rinse your hair and body thoroughly to remove the Shampoo.  4.  Use CHG as you would any other liquid soap.  You can apply chg directly to the skin and wash gently with scrungie or a clean washcloth.  5.  Apply the CHG Soap to your body ONLY FROM THE NECK DOWN.   Do not use on open wounds or open sores.  Avoid contact with your eyes, ears, mouth and genitals (private parts).  Wash genitals (private parts)  with your normal soap.  6.  Wash thoroughly, paying special attention to the area where your surgery will be performed.  7.  Thoroughly rinse your body with warm water from the neck down.  8.  DO NOT shower/wash with your normal soap after using and rinsing off  the CHG Soap.  9.  Pat yourself dry with a clean towel.  10.  Wear clean pajamas.            11.  Place clean sheets on your bed the night of your first shower and do not sleep with pets.  Day of Surgery  Do not apply any lotions/deoderants the morning of surgery.  Please wear clean clothes to the hospital/surgery center.     Please read over the following fact sheets that you were given. Pain Booklet, Coughing and Deep Breathing and Surgical Site Infection Prevention

## 2017-01-14 ENCOUNTER — Encounter (HOSPITAL_COMMUNITY)
Admission: RE | Admit: 2017-01-14 | Discharge: 2017-01-14 | Disposition: A | Discharge status: Home / Self Care | Payer: PPO | Source: Ambulatory Visit | Attending: Dentistry | Admitting: Dentistry

## 2017-01-14 ENCOUNTER — Encounter (HOSPITAL_COMMUNITY): Payer: Self-pay

## 2017-01-14 DIAGNOSIS — I509 Heart failure, unspecified: Secondary | ICD-10-CM | POA: Insufficient documentation

## 2017-01-14 DIAGNOSIS — K029 Dental caries, unspecified: Secondary | ICD-10-CM | POA: Insufficient documentation

## 2017-01-14 DIAGNOSIS — Z01812 Encounter for preprocedural laboratory examination: Secondary | ICD-10-CM | POA: Insufficient documentation

## 2017-01-14 DIAGNOSIS — I2510 Atherosclerotic heart disease of native coronary artery without angina pectoris: Secondary | ICD-10-CM | POA: Diagnosis not present

## 2017-01-14 DIAGNOSIS — Z87891 Personal history of nicotine dependence: Secondary | ICD-10-CM | POA: Diagnosis not present

## 2017-01-14 DIAGNOSIS — I110 Hypertensive heart disease with heart failure: Secondary | ICD-10-CM | POA: Diagnosis not present

## 2017-01-14 DIAGNOSIS — I351 Nonrheumatic aortic (valve) insufficiency: Secondary | ICD-10-CM | POA: Diagnosis not present

## 2017-01-14 HISTORY — DX: Anxiety disorder, unspecified: F41.9

## 2017-01-14 HISTORY — DX: Hypothyroidism, unspecified: E03.9

## 2017-01-14 HISTORY — DX: Major depressive disorder, single episode, unspecified: F32.9

## 2017-01-14 HISTORY — DX: Cardiac murmur, unspecified: R01.1

## 2017-01-14 HISTORY — DX: Depression, unspecified: F32.A

## 2017-01-14 HISTORY — DX: Dyspnea, unspecified: R06.00

## 2017-01-14 HISTORY — DX: Atherosclerotic heart disease of native coronary artery without angina pectoris: I25.10

## 2017-01-14 LAB — COMPREHENSIVE METABOLIC PANEL
ALT: 30 U/L (ref 14–54)
AST: 55 U/L — ABNORMAL HIGH (ref 15–41)
Albumin: 3 g/dL — ABNORMAL LOW (ref 3.5–5.0)
Alkaline Phosphatase: 101 U/L (ref 38–126)
Anion gap: 5 (ref 5–15)
BUN: 11 mg/dL (ref 6–20)
CO2: 25 mmol/L (ref 22–32)
Calcium: 9.1 mg/dL (ref 8.9–10.3)
Chloride: 108 mmol/L (ref 101–111)
Creatinine, Ser: 0.9 mg/dL (ref 0.44–1.00)
GFR calc Af Amer: >60 mL/min (ref >60)
GFR calc non Af Amer: >60 mL/min (ref >60)
Glucose, Bld: 78 mg/dL (ref 65–99)
Potassium: 4.1 mmol/L (ref 3.5–5.1)
Sodium: 138 mmol/L (ref 135–145)
Total Bilirubin: 0.4 mg/dL (ref 0.3–1.2)
Total Protein: 7.5 g/dL (ref 6.5–8.1)

## 2017-01-14 LAB — CBC
HCT: 40.9 % (ref 36.0–46.0)
Hemoglobin: 12.4 g/dL (ref 12.0–15.0)
MCH: 26.8 pg (ref 26.0–34.0)
MCHC: 30.3 g/dL (ref 30.0–36.0)
MCV: 88.5 fL (ref 78.0–100.0)
Platelets: 180 10*3/uL (ref 150–400)
RBC: 4.62 MIL/uL (ref 3.87–5.11)
RDW: 15.9 % — ABNORMAL HIGH (ref 11.5–15.5)
WBC: 5.7 10*3/uL (ref 4.0–10.5)

## 2017-01-14 NOTE — Progress Notes (Addendum)
Call to A. Kabbe,DNP, consulted relative to pt. Revealing the use of daily edible marijuana.  Pt. Given instructions accordingly, told to hold all marijuana after Monday night at bedtime- 01/19/2017. Pt. Agrees.  Pt. Seen By Durel Salts for evaluation of naso airway. Pt. Remarks that in 2014  she was told that she had Psychomotor retardation; depression followed & she was holding a gun in her hand but doesn't remember why she picked it up. She reports those feelings of fear surrounding that time have resolved. Pt. Denies any changes in her chest or breathing today.

## 2017-01-15 NOTE — Progress Notes (Signed)
Anesthesia Chart Review:  Pt is a 51 year old female scheduled for multiple dental extractions with alveoloplasty on 01/21/2017 with Teena Dunk, DDS in preparation for AVR with Dr. Cyndia Bent in the future.   - PCP is Hollace Kinnier, DO - Cardiologist is Candee Furbish, MD - CT surgeon is Gilford Raid, MD  PMH includes:  CAD, CHF, cardiomyopathy (EF 15% by cath 01/09/17), severe aortic regurgitation, HTN, anemia, hypothyroidism. Former smoker. BMI 22.5 - Pt consumes edible marijuana nightly to help with sleep.  After reviewing limited available pharmacokinetic information about edible marijuana, I recommended pt's last dose will be 01/19/17.    Medications include: ASA 81 mg, Lipitor, carvedilol, iron, Lasix, levothyroxine, lisinopril, potassium  Preoperative labs reviewed.    CXR 11/27/16: Chronic cardiomegaly.  No acute finding.  EKG 01/09/17: NSR. LVH. T wave abnormality, consider inferior ischemia. T wave abnormality, consider anterolateral ischemia. Prolonged QT  Cardiac cath 01/09/17:   Ost RPDA to RPDA lesion, 50% stenosed.  There is severe LV systolic dysfunction.  Hemodynamic findings consistent with moderate pulmonary hypertension.  There is mild (2+) mitral regurgitation. - Dilated cardiomyopathy with severe global LV dysfunction and an ejection fraction of 15%.  There appears to be at least mild to moderate mitral regurgitation. - Moderate elevation of right heart pressures with moderate pulmonary hypertension with a PA systolic pressure of 60 mmHg. - Mildly dilated aortic root with  moderately severe aortic insufficiency. - Mild coronary obstructive disease with a normal LAD, normal small intermediate vessel, normal left circumflex, and a large dominant RCA with smooth 50% ostial PDA narrowing.  Echo 12/10/16:  - Left ventricle: The cavity size was mildly dilated. There was moderate concentric hypertrophy. Systolic function was moderately to severely reduced. The estimated ejection  fraction was in the range of 30% to 35%. of the inferolateral myocardium. Doppler parameters are consistent with a reversible restrictive pattern, indicative of decreased left ventricular diastolic compliance and/or increased left atrial pressure (grade 3 diastolic dysfunction). Doppler parameters are consistent with high ventricular filling pressure. - Regional wall motion abnormality: Severe hypokinesis of the mid inferolateral and apical lateral myocardium; moderate hypokinesis of the entire anterior, entire inferior, basal inferolateral, basal-mid anterolateral, and apical myocardium; mild hypokinesis of the basal-mid anteroseptal, basal-mid inferoseptal, and apical septal myocardium. - Aortic valve: Transvalvular velocity was within the normal range. There was no stenosis. There was severe regurgitation originating from the central coaptation point. Severe regurgitation is suggested by a regurgitant jet width = 60% of the LVOT diameter, holodiastolic flow reversal in the descending aorta, a dense continuous wave Doppler signal, and a vena contracta = 6 mm. Regurgitation pressure half-time: 253 ms. - Mitral valve: Unable to quantify mitral regurgitation. PISA imaging was not obtained. Cannot use the continuity equation due to severe aortic regurgitation. Mobility of the posterior leaflet was mildly restricted. Transvalvular velocity was within the normal range. There was no evidence for stenosis. There was moderate regurgitation. - Left atrium: The appendage was severely dilated. - Right ventricle: Systolic function was moderately reduced. - Atrial septum: The septum bowed from left to right, consistent with increased left atrial pressure. - Tricuspid valve: There was mild regurgitation. - Pulmonary arteries: Systolic pressure was within the normal range. PA peak pressure: 29 mm Hg (S). - Pericardium, extracardiac: A trivial pericardial effusion was identified.  I evaluated pt in PAT for nasal  intubation and I see no contraindications.    If no changes, I anticipate pt can proceed with surgery as scheduled.   Levada Dy  Audree Bane [facility]/Anesthesiology Phone: [phone] 01/15/2017 12:04 PM

## 2017-01-20 MED ORDER — CEFAZOLIN SODIUM-DEXTROSE 2-4 GM/100ML-% IV SOLN
2.0000 g | Freq: Once | INTRAVENOUS | Status: AC
  Administered 2017-01-21: 2 g via INTRAVENOUS
  Filled 2017-01-20: qty 100

## 2017-01-20 NOTE — Anesthesia Preprocedure Evaluation (Addendum)
Anesthesia Evaluation  Patient identified by MRN, date of birth, ID band Patient awake    Reviewed: Allergy & Precautions, NPO status , Patient's Chart, lab work & pertinent test results, reviewed documented beta blocker date and time   Airway Mallampati: III  TM Distance: >3 FB Neck ROM: Full    Dental  (+) Dental Advisory Given   Pulmonary shortness of breath, with exertion and at rest, COPD, former smoker,    breath sounds clear to auscultation       Cardiovascular hypertension, Pt. on medications and Pt. on home beta blockers + CAD, + Peripheral Vascular Disease and +CHF  + Valvular Problems/Murmurs AI and MR  Rhythm:Regular Rate:Normal     Neuro/Psych Anxiety Depression negative neurological ROS     GI/Hepatic negative GI ROS, Neg liver ROS,   Endo/Other  Hypothyroidism   Renal/GU negative Renal ROS     Musculoskeletal   Abdominal   Peds  Hematology negative hematology ROS (+)   Anesthesia Other Findings   Reproductive/Obstetrics                            Lab Results  Component Value Date   WBC 5.7 01/14/2017   HGB 12.4 01/14/2017   HCT 40.9 01/14/2017   MCV 88.5 01/14/2017   PLT 180 01/14/2017   Lab Results  Component Value Date   CREATININE 0.90 01/14/2017   BUN 11 01/14/2017   NA 138 01/14/2017   K 4.1 01/14/2017   CL 108 01/14/2017   CO2 25 01/14/2017   Lab Results  Component Value Date   INR 1.1 01/05/2017   INR 0.97 10/29/2012   INR 1.02 10/25/2012   Cardiac cath 01/09/17:   Ost RPDA to RPDA lesion, 50% stenosed.  There is severe LV systolic dysfunction.  Hemodynamic findings consistent with moderate pulmonary hypertension.  There is mild (2+) mitral regurgitation. - Dilated cardiomyopathy with severe global LV dysfunction and an ejection fraction of 15%. There appears to be at least mild to moderate mitral regurgitation. - Moderate elevation of right heart  pressures with moderate pulmonary hypertension with a PA systolic pressure of 60 mmHg. - Mildly dilated aortic root with moderately severe aortic insufficiency. - Mild coronary obstructive disease with a normal LAD, normal small intermediate vessel, normal left circumflex, and a large dominant RCA with smooth 50% ostial PDA narrowing.  Echo 12/10/16:  - Left ventricle: The cavity size was mildly dilated. There wasmoderate concentric hypertrophy. Systolic function was moderatelyto severely reduced. The estimated ejection fraction was in therange of 30% to 35%. of the inferolateral myocardium. Dopplerparameters are consistent with a reversible restrictive pattern,indicative of decreased left ventricular diastolic compliance and/or increased left atrial pressure (grade 3 diastolic dysfunction). Doppler parameters are consistent with highventricular filling pressure. - Regional wall motion abnormality: Severe hypokinesis of the midinferolateral and apical lateral myocardium; moderate hypokinesisof the entire anterior, entire inferior, basal inferolateral,basal-mid anterolateral, and apical myocardium; mild hypokinesisof the basal-mid anteroseptal, basal-mid inferoseptal, and apical septal myocardium. - Aortic valve: Transvalvular velocity was within the normal range.There was no stenosis. There was severe regurgitation originatingfrom the central coaptation point. Severe regurgitation issuggested by a regurgitant jet width = 60% of the LVOT diameter,holodiastolic flow reversal in the descending aorta, a densecontinuous wave Doppler signal, and a vena contracta = 6 mm. Regurgitation pressure half-time: 253 ms. - Mitral valve: Unable to quantify mitral regurgitation. PISA imaging was not obtained. Cannot use the continuity equation dueto severe aortic regurgitation.  Mobility of the posterior leafletwas mildly restricted. Transvalvular velocity was within thenormal range. There was no evidence for  stenosis. There wasmoderate regurgitation. - Left atrium: The appendage was severely dilated. - Right ventricle: Systolic function was moderately reduced. - Atrial septum: The septum bowed from left to right, consistentwith increased left atrial pressure. - Tricuspid valve: There was mild regurgitation. - Pulmonary arteries: Systolic pressure was within the normalrange. PA peak pressure: 29 mm Hg (S). - Pericardium, extracardiac: A trivial pericardial effusion wasidentified. Anesthesia Physical Anesthesia Plan  ASA: IV  Anesthesia Plan: General   Post-op Pain Management:    Induction: Intravenous  PONV Risk Score and Plan: 3 and Ondansetron, Dexamethasone, Propofol and Midazolam  Airway Management Planned: Oral ETT  Additional Equipment:   Intra-op Plan:   Post-operative Plan: Extubation in OR  Informed Consent: I have reviewed the patients History and Physical, chart, labs and discussed the procedure including the risks, benefits and alternatives for the proposed anesthesia with the patient or authorized representative who has indicated his/her understanding and acceptance.   Dental advisory given  Plan Discussed with: CRNA  Anesthesia Plan Comments:        Anesthesia Quick Evaluation

## 2017-01-21 ENCOUNTER — Encounter (HOSPITAL_COMMUNITY): Payer: Self-pay | Admitting: Certified Registered Nurse Anesthetist

## 2017-01-21 ENCOUNTER — Ambulatory Visit (HOSPITAL_COMMUNITY): Payer: PPO | Admitting: Emergency Medicine

## 2017-01-21 ENCOUNTER — Ambulatory Visit (HOSPITAL_COMMUNITY): Payer: PPO | Admitting: Anesthesiology

## 2017-01-21 ENCOUNTER — Ambulatory Visit (HOSPITAL_COMMUNITY)
Admission: RE | Admit: 2017-01-21 | Discharge: 2017-01-21 | Disposition: A | Discharge status: Home / Self Care | Payer: PPO | Source: Ambulatory Visit | Attending: Dentistry | Admitting: Dentistry

## 2017-01-21 ENCOUNTER — Encounter (HOSPITAL_COMMUNITY)
Admission: RE | Disposition: A | Discharge status: Home / Self Care | Payer: Self-pay | Source: Ambulatory Visit | Attending: Dentistry

## 2017-01-21 DIAGNOSIS — K045 Chronic apical periodontitis: Secondary | ICD-10-CM | POA: Diagnosis present

## 2017-01-21 DIAGNOSIS — Z79899 Other long term (current) drug therapy: Secondary | ICD-10-CM | POA: Insufficient documentation

## 2017-01-21 DIAGNOSIS — I351 Nonrheumatic aortic (valve) insufficiency: Secondary | ICD-10-CM | POA: Diagnosis not present

## 2017-01-21 DIAGNOSIS — Z7982 Long term (current) use of aspirin: Secondary | ICD-10-CM | POA: Diagnosis not present

## 2017-01-21 DIAGNOSIS — Z803 Family history of malignant neoplasm of breast: Secondary | ICD-10-CM | POA: Insufficient documentation

## 2017-01-21 DIAGNOSIS — I2510 Atherosclerotic heart disease of native coronary artery without angina pectoris: Secondary | ICD-10-CM | POA: Diagnosis not present

## 2017-01-21 DIAGNOSIS — I739 Peripheral vascular disease, unspecified: Secondary | ICD-10-CM | POA: Insufficient documentation

## 2017-01-21 DIAGNOSIS — Z8261 Family history of arthritis: Secondary | ICD-10-CM | POA: Insufficient documentation

## 2017-01-21 DIAGNOSIS — J449 Chronic obstructive pulmonary disease, unspecified: Secondary | ICD-10-CM | POA: Diagnosis not present

## 2017-01-21 DIAGNOSIS — Z8249 Family history of ischemic heart disease and other diseases of the circulatory system: Secondary | ICD-10-CM | POA: Diagnosis not present

## 2017-01-21 DIAGNOSIS — I429 Cardiomyopathy, unspecified: Secondary | ICD-10-CM | POA: Insufficient documentation

## 2017-01-21 DIAGNOSIS — I110 Hypertensive heart disease with heart failure: Secondary | ICD-10-CM | POA: Insufficient documentation

## 2017-01-21 DIAGNOSIS — F419 Anxiety disorder, unspecified: Secondary | ICD-10-CM | POA: Diagnosis not present

## 2017-01-21 DIAGNOSIS — F329 Major depressive disorder, single episode, unspecified: Secondary | ICD-10-CM | POA: Diagnosis not present

## 2017-01-21 DIAGNOSIS — E785 Hyperlipidemia, unspecified: Secondary | ICD-10-CM | POA: Insufficient documentation

## 2017-01-21 DIAGNOSIS — I5042 Chronic combined systolic (congestive) and diastolic (congestive) heart failure: Secondary | ICD-10-CM | POA: Insufficient documentation

## 2017-01-21 DIAGNOSIS — K029 Dental caries, unspecified: Secondary | ICD-10-CM | POA: Insufficient documentation

## 2017-01-21 DIAGNOSIS — Z87891 Personal history of nicotine dependence: Secondary | ICD-10-CM | POA: Diagnosis not present

## 2017-01-21 DIAGNOSIS — Z885 Allergy status to narcotic agent status: Secondary | ICD-10-CM | POA: Insufficient documentation

## 2017-01-21 DIAGNOSIS — Z823 Family history of stroke: Secondary | ICD-10-CM | POA: Insufficient documentation

## 2017-01-21 DIAGNOSIS — K0530 Chronic periodontitis, unspecified: Secondary | ICD-10-CM | POA: Diagnosis present

## 2017-01-21 DIAGNOSIS — K0889 Other specified disorders of teeth and supporting structures: Secondary | ICD-10-CM | POA: Diagnosis not present

## 2017-01-21 DIAGNOSIS — E039 Hypothyroidism, unspecified: Secondary | ICD-10-CM | POA: Insufficient documentation

## 2017-01-21 DIAGNOSIS — D259 Leiomyoma of uterus, unspecified: Secondary | ICD-10-CM | POA: Diagnosis not present

## 2017-01-21 HISTORY — PX: MULTIPLE EXTRACTIONS WITH ALVEOLOPLASTY: SHX5342

## 2017-01-21 SURGERY — MULTIPLE EXTRACTION WITH ALVEOLOPLASTY
Anesthesia: General | Site: Mouth

## 2017-01-21 MED ORDER — 0.9 % SODIUM CHLORIDE (POUR BTL) OPTIME
TOPICAL | Status: DC | PRN
  Administered 2017-01-21: 1000 mL

## 2017-01-21 MED ORDER — ALBUTEROL SULFATE HFA 108 (90 BASE) MCG/ACT IN AERS
INHALATION_SPRAY | RESPIRATORY_TRACT | Status: AC
  Filled 2017-01-21: qty 6.7

## 2017-01-21 MED ORDER — ARTIFICIAL TEARS OPHTHALMIC OINT
TOPICAL_OINTMENT | OPHTHALMIC | Status: DC | PRN
  Administered 2017-01-21: 1 via OPHTHALMIC

## 2017-01-21 MED ORDER — LACTATED RINGERS IV SOLN
INTRAVENOUS | Status: DC | PRN
  Administered 2017-01-21: 08:00:00 via INTRAVENOUS

## 2017-01-21 MED ORDER — OXYMETAZOLINE HCL 0.05 % NA SOLN
NASAL | Status: DC | PRN
  Administered 2017-01-21: 2 via NASAL

## 2017-01-21 MED ORDER — LIDOCAINE 2% (20 MG/ML) 5 ML SYRINGE
INTRAMUSCULAR | Status: AC
  Filled 2017-01-21: qty 5

## 2017-01-21 MED ORDER — PROMETHAZINE HCL 25 MG/ML IJ SOLN: 6.2500 mg | INTRAMUSCULAR | Status: DC | PRN

## 2017-01-21 MED ORDER — PROPOFOL 10 MG/ML IV BOLUS
INTRAVENOUS | Status: AC
  Filled 2017-01-21: qty 20

## 2017-01-21 MED ORDER — SUCCINYLCHOLINE CHLORIDE 200 MG/10ML IV SOSY
PREFILLED_SYRINGE | INTRAVENOUS | Status: AC
  Filled 2017-01-21: qty 10

## 2017-01-21 MED ORDER — ONDANSETRON HCL 4 MG/2ML IJ SOLN
INTRAMUSCULAR | Status: DC | PRN
  Administered 2017-01-21: 4 mg via INTRAVENOUS

## 2017-01-21 MED ORDER — FENTANYL CITRATE (PF) 250 MCG/5ML IJ SOLN
INTRAMUSCULAR | Status: DC | PRN
  Administered 2017-01-21: 50 ug via INTRAVENOUS

## 2017-01-21 MED ORDER — ALBUTEROL SULFATE HFA 108 (90 BASE) MCG/ACT IN AERS
INHALATION_SPRAY | RESPIRATORY_TRACT | Status: DC | PRN
  Administered 2017-01-21 (×2): 2 via RESPIRATORY_TRACT

## 2017-01-21 MED ORDER — GLYCOPYRROLATE 0.2 MG/ML IJ SOLN
INTRAMUSCULAR | Status: DC | PRN
  Administered 2017-01-21 (×2): 0.1 mg via INTRAVENOUS

## 2017-01-21 MED ORDER — ONDANSETRON HCL 4 MG/2ML IJ SOLN
INTRAMUSCULAR | Status: AC
  Filled 2017-01-21: qty 2

## 2017-01-21 MED ORDER — OXYMETAZOLINE HCL 0.05 % NA SOLN
NASAL | Status: AC
  Filled 2017-01-21: qty 15

## 2017-01-21 MED ORDER — PROPOFOL 10 MG/ML IV BOLUS
INTRAVENOUS | Status: DC | PRN
  Administered 2017-01-21: 30 mg via INTRAVENOUS
  Administered 2017-01-21: 120 mg via INTRAVENOUS

## 2017-01-21 MED ORDER — MIDAZOLAM HCL 2 MG/2ML IJ SOLN
INTRAMUSCULAR | Status: DC | PRN
  Administered 2017-01-21: 1 mg via INTRAVENOUS

## 2017-01-21 MED ORDER — FUROSEMIDE 10 MG/ML IJ SOLN
40.0000 mg | INTRAMUSCULAR | Status: AC
  Administered 2017-01-21: 40 mg via INTRAVENOUS
  Filled 2017-01-21: qty 4

## 2017-01-21 MED ORDER — LIDOCAINE-EPINEPHRINE 2 %-1:100000 IJ SOLN
INTRAMUSCULAR | Status: AC
  Filled 2017-01-21: qty 10.2

## 2017-01-21 MED ORDER — MIDAZOLAM HCL 2 MG/2ML IJ SOLN
INTRAMUSCULAR | Status: AC
  Filled 2017-01-21: qty 2

## 2017-01-21 MED ORDER — SUCCINYLCHOLINE CHLORIDE 20 MG/ML IJ SOLN
INTRAMUSCULAR | Status: DC | PRN
  Administered 2017-01-21: 80 mg via INTRAVENOUS

## 2017-01-21 MED ORDER — TRAMADOL HCL 50 MG PO TABS: 50.0000 mg | ORAL_TABLET | Freq: Four times a day (QID) | ORAL | 0 refills | Status: DC | PRN

## 2017-01-21 MED ORDER — LIDOCAINE HCL (CARDIAC) 20 MG/ML IV SOLN
INTRAVENOUS | Status: DC | PRN
  Administered 2017-01-21: 60 mg via INTRATRACHEAL

## 2017-01-21 MED ORDER — BUPIVACAINE-EPINEPHRINE (PF) 0.5% -1:200000 IJ SOLN
INTRAMUSCULAR | Status: AC
  Filled 2017-01-21: qty 3.6

## 2017-01-21 MED ORDER — LIDOCAINE-EPINEPHRINE 2 %-1:100000 IJ SOLN
INTRAMUSCULAR | Status: DC | PRN
  Administered 2017-01-21: 6.8 mL via INTRADERMAL

## 2017-01-21 MED ORDER — DEXAMETHASONE SODIUM PHOSPHATE 10 MG/ML IJ SOLN
INTRAMUSCULAR | Status: AC
  Filled 2017-01-21: qty 1

## 2017-01-21 MED ORDER — PHENYLEPHRINE 40 MCG/ML (10ML) SYRINGE FOR IV PUSH (FOR BLOOD PRESSURE SUPPORT)
PREFILLED_SYRINGE | INTRAVENOUS | Status: AC
  Filled 2017-01-21: qty 20

## 2017-01-21 MED ORDER — LACTATED RINGERS IV SOLN: INTRAVENOUS | Status: DC

## 2017-01-21 MED ORDER — PHENYLEPHRINE HCL 10 MG/ML IJ SOLN
INTRAMUSCULAR | Status: DC | PRN
  Administered 2017-01-21: 80 ug via INTRAVENOUS
  Administered 2017-01-21: 120 ug via INTRAVENOUS
  Administered 2017-01-21: 80 ug via INTRAVENOUS
  Administered 2017-01-21: 160 ug via INTRAVENOUS
  Administered 2017-01-21 (×3): 120 ug via INTRAVENOUS

## 2017-01-21 MED ORDER — ARTIFICIAL TEARS OPHTHALMIC OINT
TOPICAL_OINTMENT | OPHTHALMIC | Status: AC
  Filled 2017-01-21: qty 3.5

## 2017-01-21 MED ORDER — FENTANYL CITRATE (PF) 100 MCG/2ML IJ SOLN: 25.0000 ug | INTRAMUSCULAR | Status: DC | PRN

## 2017-01-21 MED ORDER — DEXAMETHASONE SODIUM PHOSPHATE 10 MG/ML IJ SOLN
INTRAMUSCULAR | Status: DC | PRN
  Administered 2017-01-21: 10 mg via INTRAVENOUS

## 2017-01-21 MED ORDER — FENTANYL CITRATE (PF) 250 MCG/5ML IJ SOLN
INTRAMUSCULAR | Status: AC
  Filled 2017-01-21: qty 5

## 2017-01-21 SURGICAL SUPPLY — 37 items
ALCOHOL 70% 16 OZ (MISCELLANEOUS) ×2 IMPLANT
ATTRACTOMAT 16X20 MAGNETIC DRP (DRAPES) ×2 IMPLANT
BLADE SURG 15 STRL LF DISP TIS (BLADE) ×2 IMPLANT
BLADE SURG 15 STRL SS (BLADE) ×4
COVER SURGICAL LIGHT HANDLE (MISCELLANEOUS) ×2 IMPLANT
GAUZE PACKING FOLDED 2  STR (GAUZE/BANDAGES/DRESSINGS) ×2
GAUZE PACKING FOLDED 2 STR (GAUZE/BANDAGES/DRESSINGS) ×1 IMPLANT
GAUZE SPONGE 4X4 12PLY STRL LF (GAUZE/BANDAGES/DRESSINGS) ×2 IMPLANT
GAUZE SPONGE 4X4 16PLY XRAY LF (GAUZE/BANDAGES/DRESSINGS) ×2 IMPLANT
GLOVE BIOGEL PI IND STRL 6 (GLOVE) ×1 IMPLANT
GLOVE BIOGEL PI INDICATOR 6 (GLOVE) ×1
GLOVE SURG ORTHO 8.0 STRL STRW (GLOVE) ×2 IMPLANT
GLOVE SURG SS PI 6.0 STRL IVOR (GLOVE) ×2 IMPLANT
GOWN STRL REUS W/ TWL LRG LVL3 (GOWN DISPOSABLE) ×1 IMPLANT
GOWN STRL REUS W/TWL 2XL LVL3 (GOWN DISPOSABLE) ×2 IMPLANT
GOWN STRL REUS W/TWL LRG LVL3 (GOWN DISPOSABLE) ×2
HEMOSTAT SURGICEL 2X14 (HEMOSTASIS) ×2 IMPLANT
KIT BASIN OR (CUSTOM PROCEDURE TRAY) ×2 IMPLANT
KIT ROOM TURNOVER OR (KITS) ×2 IMPLANT
MANIFOLD NEPTUNE WASTE (CANNULA) ×2 IMPLANT
NEEDLE BLUNT 16X1.5 OR ONLY (NEEDLE) ×2 IMPLANT
NEEDLE DENTAL 27 LONG (NEEDLE) ×4 IMPLANT
NS IRRIG 1000ML POUR BTL (IV SOLUTION) ×2 IMPLANT
PACK EENT II TURBAN DRAPE (CUSTOM PROCEDURE TRAY) ×2 IMPLANT
PAD ARMBOARD 7.5X6 YLW CONV (MISCELLANEOUS) ×2 IMPLANT
SPONGE SURGIFOAM ABS GEL 100 (HEMOSTASIS) IMPLANT
SPONGE SURGIFOAM ABS GEL 12-7 (HEMOSTASIS) IMPLANT
SPONGE SURGIFOAM ABS GEL SZ50 (HEMOSTASIS) IMPLANT
SUCTION FRAZIER HANDLE 10FR (MISCELLANEOUS) ×2
SUCTION TUBE FRAZIER 10FR DISP (MISCELLANEOUS) ×1 IMPLANT
SUT CHROMIC 3 0 PS 2 (SUTURE) ×4 IMPLANT
SUT CHROMIC 4 0 P 3 18 (SUTURE) IMPLANT
SYR 50ML SLIP (SYRINGE) ×2 IMPLANT
TOWEL OR 17X26 10 PK STRL BLUE (TOWEL DISPOSABLE) IMPLANT
TUBE CONNECTING 12X1/4 (SUCTIONS) ×2 IMPLANT
WATER TABLETS ICX (MISCELLANEOUS) ×2 IMPLANT
YANKAUER SUCT BULB TIP NO VENT (SUCTIONS) ×2 IMPLANT

## 2017-01-21 NOTE — Anesthesia Procedure Notes (Signed)
Procedure Name: Intubation Date/Time: 01/21/2017 8:54 AM Performed by: Roderic Palau Pre-anesthesia Checklist: Patient identified, Emergency Drugs available, Suction available and Patient being monitored Patient Re-evaluated:Patient Re-evaluated prior to inductionOxygen Delivery Method: Circle system utilized Preoxygenation: Pre-oxygenation with 100% oxygen Intubation Type: IV induction Ventilation: Mask ventilation without difficulty Laryngoscope Size: Mac and 3 Grade View: Grade II Tube type: Oral Tube size: 7.0 mm Number of attempts: 1 Airway Equipment and Method: Stylet Placement Confirmation: ETT inserted through vocal cords under direct vision,  positive ETCO2 and breath sounds checked- equal and bilateral Secured at: 22 cm Tube secured with: Tape Dental Injury: Teeth and Oropharynx as per pre-operative assessment

## 2017-01-21 NOTE — Progress Notes (Signed)
PRE-OPERATIVE NOTE:  01/21/2017 Miranda [name] [id number]  VITALS: BP (!) 119/59   Pulse 74   Resp 18   LMP 01/17/2015   SpO2 100%   Lab Results  Component Value Date   WBC 5.7 01/14/2017   HGB 12.4 01/14/2017   HCT 40.9 01/14/2017   MCV 88.5 01/14/2017   PLT 180 01/14/2017   BMET    Component Value Date/Time   NA 138 01/14/2017 1005   NA 143 01/05/2017 1433   K 4.1 01/14/2017 1005   CL 108 01/14/2017 1005   CO2 25 01/14/2017 1005   GLUCOSE 78 01/14/2017 1005   BUN 11 01/14/2017 1005   BUN 12 01/05/2017 1433   CREATININE 0.90 01/14/2017 1005   CREATININE 0.86 09/26/2016 1019   CALCIUM 9.1 01/14/2017 1005   GFRNONAA >60 01/14/2017 1005   GFRAA >60 01/14/2017 1005    Lab Results  Component Value Date   INR 1.1 01/05/2017   INR 0.97 10/29/2012   INR 1.02 10/25/2012   No results found for: PTT   Miranda [name] presents for dental procedures in the operating room.   SUBJECTIVE: The patient denies any acute medical or dental changes and agrees to proceed with treatment as planned.  EXAM: No sign of acute dental changes.  ASSESSMENT: Patient is affected by chronic apical periodontitis, dental caries, chronic periodontitis, and loose teeth.  PLAN: Patient agrees to proceed with treatment as planned in the operating room as previously discussed and accepts the risks, benefits, and complications of the proposed treatment. Patient is aware of the risk for bleeding, bruising, swelling, infection, pain, nerve damage,  soft tissue damage, sinus involvement, root tip fracture, mandible fracture, and the risks of complications a complications up to and including death due to overall cardiovascular and respiratory compromise.   Lenn Cal, DDS

## 2017-01-21 NOTE — Discharge Instructions (Signed)

## 2017-01-21 NOTE — Transfer of Care (Signed)
Immediate Anesthesia Transfer of Care Note  Patient: Miranda [name]  Procedure(s) Performed: Procedure(s): Extraction of tooth #'s 4-13 with alveoloplasty (N/A)  Patient Location: PACU  Anesthesia Type:General  Level of Consciousness: awake, alert  and patient cooperative  Airway & Oxygen Therapy: Patient Spontanous Breathing and Patient connected to face mask oxygen  Post-op Assessment: Report given to RN, Post -op Vital signs reviewed and stable, Patient moving all extremities X 4 and Patient able to stick tongue midline  Post vital signs: Reviewed and stable  Last Vitals:  Vitals:   01/21/17 0702  BP: (!) 119/59  Pulse: 74  Resp: 18    Last Pain:  Vitals:   01/21/17 0702  TempSrc: Oral         Complications: No apparent anesthesia complications

## 2017-01-21 NOTE — Op Note (Signed)
OPERATIVE REPORT  Patient:            Miranda [name] Date of Birth:  January 14, 1966 MRN:                [id number]   DATE OF PROCEDURE:  01/21/2017  PREOPERATIVE DIAGNOSES: 1. Severe aortic insufficiency 2. Preaortic valve replacement dental protocol  3. Chronic apical periodontitis 4. Chronic periodontitis 5. Loose teeth  POSTOPERATIVE DIAGNOSES: 1. Severe aortic insufficiency 2. Preaortic valve replacement dental protocol  3. Chronic apical periodontitis 4. Chronic periodontitis 5. Loose teeth  OPERATIONS: 1. Multiple extraction of tooth numbers 4-13 2. 2 Quadrants of alveoloplasty   SURGEON: Lenn Cal, DDS  ASSISTANT: Camie Patience, (dental assistant)  ANESTHESIA: General anesthesia via oral endotracheal tube.  MEDICATIONS: 1. Ancef 2 g IV prior to invasive dental procedures. 2. Local anesthesia with a total utilization of 4 carpules each containing 34 mg of lidocaine with 0.017 mg of epinephrine   SPECIMENS: There are 10 teeth that were discarded.  DRAINS: None  CULTURES: None  COMPLICATIONS: None   ESTIMATED BLOOD LOSS: 50 mLs.  INTRAVENOUS FLUIDS: 800 mLs of Lactated ringers solution.  INDICATIONS: The patient was recently diagnosed with severe aortic insufficiency.  A medically necessary dental consultation was then requested to evaluate poor dentition as part of a pre-heart valve surgery dental protocol .  The patient was examined and treatment planned for extraction of remaining teeth with alveoloplasty in the operating room with general anesthesia.  This treatment plan was formulated to decrease the risks and complications associated with dental infection from affecting the patient's systemic health and the anticipated heart valve surgery.   OPERATIVE FINDINGS: Patient was examined operating room number 12.  The teeth were identified for extraction. The patient was noted be affected by chronic periodontitis, chronic apical periodontitis, dental  caries, and loose teeth.   DESCRIPTION OF PROCEDURE: Patient was brought to the main operating room number 12. Patient was then placed in the supine position on the operating table. General anesthesia was then induced per the anesthesia team. The patient was then prepped and draped in the usual manner for dental medicine procedure. A timeout was performed. The patient was identified and procedures were verified. A throat pack was placed at this time. The oral cavity was then thoroughly examined with the findings noted above. The patient was then ready for dental medicine procedure as follows:  Local anesthesia was then administered sequentially with a total utilization of 4 carpules each containing 34 mg of lidocaine with 0.017 mg of epinephrine.  The Maxillary left and right quadrants first approached. Anesthesia was then delivered utilizing infiltration with lidocaine with epinephrine. A #15 blade incision was then made from the distal of #3 and extended to the distal of number 14.  A  surgical flap was then carefully reflected. The teeth were then subluxated with a series straight elevators. Tooth numbers 4-13 were then removed with a 150 forceps without complications. Alveoloplasty was then performed utilizing a rongeurs and bone file. The tissues were approximated and trimmed appropriately. Surgical sites were then irrigated with copious postural saline 4. The maxillary right surgical site was then closed from the distal of #3 and extended the mesial #8 utilizing 3-0 chromic gut suture in a continuous interrupted suture technique 1. The maxillary left surgical site was then closed from the distal of #14 and extended the mesial of #9 utilizing 3-0 chromic gut suture in a continuous interrupted suture technique 1. 2 individual interrupted sutures are then  placed further close the surgical site as needed.  At this point time, the entire mouth was irrigated with copious amounts of sterile saline. The  patient was examined for complications, seeing none, the dental medicine procedure was deemed to be complete. The throat pack was removed at this time. A series of 4 x 4 gauze were placed in the mouth to aid hemostasis. The patient was then handed over to the anesthesia team for final disposition. After an appropriate amount of time, the patient was extubated and taken to the postanesthsia care unit in good condition. All counts were correct for the dental medicine procedure.   Lenn Cal, DDS.

## 2017-01-21 NOTE — H&P (Signed)
01/21/2017  Patient:            Miranda [name] Date of Birth:  October 31, 1965 MRN:                [id number]   BP (!) 119/59   Pulse 74   Resp 18   LMP 01/17/2015   SpO2 100%   Miranda [name] is a 51 year old female that presents for multiple dental extractions with alveoloplasty in the operating room with general anesthesia. Patient denies any acute medical or dental changes. Please see note from Dr. Candee Furbish to act as H&P for the dental operating room procedure dated 01/05/2017.  Lenn Cal, DDS  Cardiology Office Note   Date:  01/05/2017   ID:  Takina Busser, DOB September 28, 1965, MRN [id number]  PCP:  Gayland Curry, DO      Cardiologist:   Candee Furbish, MD     History of Present Illness: Miranda [name] is a 52 y.o. female here for chronic systolic heart failure, cardiomyopathy follow-up, nonischemic cardiomyopathy. With newly discovered severe aortic insufficiency. Previous ejection fraction was 15%.  EF improved to 50% in March 2015. She returned in 2018 with worsening shortness of breath, going up stairs for instance. An echocardiogram was then performed which showed severe aortic regurgitation and EF of 35%.   ACE inhibitor and carvedilol.  Her EKG at baseline is abnormal with T-wave inversion in multiple leads. This is no change from prior. She does not have LVH on echo. No high-risk symptoms such as syncope. Prior cardiac catheterization was normal with no coronary artery disease.  Her primary physician Dr. Mariea Clonts, asked for her to be seen because of her prior cardiomyopathy. She used to see Dr. Haroldine Laws.         Past Medical History:  Diagnosis Date  . Anemia   . At risk for sudden cardiac death 11-14-12  . CHF - Combined Systolic + Diastolic Dysfunction. EF of 15-20% with Grade II diastolic dysfunction on echo 10/27/12 10/28/2012  . Fibroids   . Hypertension   . S/P cardiac catheterization, 2012/11/14, normal coronaries with minimal luminal irregularities in  RCA system 10/30/2012  . Thyroid disease    hypothyroidism         Past Surgical History:  Procedure Laterality Date  . LEFT AND RIGHT HEART CATHETERIZATION WITH CORONARY ANGIOGRAM N/A 11/14/2012   Procedure: LEFT AND RIGHT HEART CATHETERIZATION WITH CORONARY ANGIOGRAM;  Surgeon: Leonie Man, MD;  Location: [facility] CATH LAB;  Service: Cardiovascular;  Laterality: N/A;           Current Outpatient Prescriptions  Medication Sig Dispense Refill  . acetaminophen (TYLENOL) 500 MG tablet Take 500 mg by mouth as needed for headache (pain).     Marland Kitchen aspirin EC 81 MG EC tablet Take 1 tablet (81 mg total) by mouth daily.    Marland Kitchen atorvastatin (LIPITOR) 10 MG tablet TAKE ONE TABLET BY MOUTH ONCE DAILY AT 6 PM 90 tablet 3  . buPROPion (WELLBUTRIN XL) 150 MG 24 hr tablet TAKE ONE TABLET BY MOUTH ONCE DAILY 30 tablet 5  . carvedilol (COREG) 6.25 MG tablet Take 1 tablet (6.25 mg total) by mouth 2 (two) times daily with a meal. 180 tablet 3  . Ferrous Fumarate (HEMOCYTE - 106 MG FE) 324 (106 Fe) MG TABS tablet Take 1 tablet (106 mg of iron total) by mouth 2 (two) times daily. 180 tablet 3  . furosemide (LASIX) 40 MG tablet Take 1 tablet (40 mg total) by mouth daily.  EXCEPT ON MON. AND THURS. TAKE TABLET AND HALF 45 tablet 3  . levothyroxine (SYNTHROID, LEVOTHROID) 112 MCG tablet TAKE ONE TABLET BY MOUTH ONCE DAILY BEFORE BREAKFAST 30 tablet 4  . lisinopril (PRINIVIL,ZESTRIL) 10 MG tablet Take 1 tablet (10 mg total) by mouth daily. 30 tablet 3  . mirtazapine (REMERON) 7.5 MG tablet Take 1 tablet (7.5 mg total) by mouth at bedtime. 90 tablet 3  . potassium chloride SA (K-DUR,KLOR-CON) 20 MEQ tablet Take 1 tablet (20 mEq total) by mouth daily. 30 tablet 3  . traZODone (DESYREL) 150 MG tablet Take 1 tablet (150 mg total) by mouth at bedtime. 90 tablet 3   No current facility-administered medications for this visit.     Allergies:   Codeine    Social History:  The patient  reports that she quit  smoking about 38 years ago. Her smoking use included Cigars and Cigarettes. She has a 2.50 pack-year smoking history. She has never used smokeless tobacco. She reports that she drinks about 1.8 oz of alcohol per week . She reports that she uses drugs, including Marijuana, about 5 times per week.   Family History:  The patient's family history includes Arthritis in her paternal uncle; Autoimmune disease in her mother; Breast cancer in her cousin; Cancer in her maternal uncle and paternal grandfather; Hypertension in her father, maternal grandmother, and mother; Stroke in her father.    ROS:  Please see the history of present illness.   Reduce shortness of breath, no bleeding, no syncope, no strokelike symptoms, no orthopnea  PHYSICAL EXAM: VS:  BP 140/80   Pulse 96   Ht 5' 4.5" (1.638 m)   Wt 134 lb (60.8 kg)   LMP 01/17/2015   BMI 22.65 kg/m  , BMI Body mass index is 22.65 kg/m. GEN: Well nourished, well developed, in no acute distress  HEENT: normal  Neck: no JVD, carotid bruits, or masses, strong carotid upstroke Cardiac: RRR; soft diastolic murmur, no rubs, or gallops,no edema  Respiratory:  clear to auscultation bilaterally, normal work of breathing GI: soft, nontender, nondistended, + BS MS: no deformity or atrophy  Skin: warm and dry, no rash Neuro:  Alert and Oriented x 3, Strength and sensation are intact Psych: depressedl affect    EKG:  Today 04/28/16-heart rate 72 bpm, sinus rhythm, T-wave inferior laterally inversion, LVH. Personally viewed-no significant change from prior 04/24/15-sinus rhythm, 78, T wave inversion in multiple leads, no change from prior. QT interval 408 ms. Personally viewed.  ECHO: 10/19/13-ejection fraction 50%, increased from prior of 15%. Now 35% again with severe AI in 2018   Recent Labs: 01/25/2016: ALT 18 09/26/2016: Hemoglobin 11.9; TSH 3.62 11/27/2016: NT-Pro BNP 5,337; Platelets 232 12/03/2016: BUN 15; Creatinine, Ser 0.80; Potassium  4.2; Sodium 142    Lipid Panel Labs(Brief)          Component Value Date/Time   CHOL 163 09/26/2016 1019   CHOL 169 01/25/2016 1019   TRIG 54 09/26/2016 1019   HDL 60 09/26/2016 1019   HDL 49 01/25/2016 1019   CHOLHDL 2.7 09/26/2016 1019   VLDL 11 09/26/2016 1019   LDLCALC 92 09/26/2016 1019   LDLCALC 89 01/25/2016 1019           Wt Readings from Last 3 Encounters:  01/05/17 134 lb (60.8 kg)  01/01/17 128 lb (58.1 kg)  12/03/16 131 lb 8 oz (59.6 kg)      Other studies Reviewed: Additional studies/ records that were reviewed today include: Labs,  office notes, echo, catheterization, EKG. Review of the above records demonstrates: As above  Echocardiogram 12/10/16 Left ventricle: The cavity size was mildly dilated. There was moderate concentric hypertrophy. Systolic function was moderately to severely reduced. The estimated ejection fraction was in the range of 30% to 35%. of the inferolateral myocardium. Doppler parameters are consistent with a reversible restrictive pattern, indicative of decreased left ventricular diastolic compliance and/or increased left atrial pressure (grade 3 diastolic dysfunction). Doppler parameters are consistent with high ventricular filling pressure. - Regional wall motion abnormality: Severe hypokinesis of the mid inferolateral and apical lateral myocardium; moderate hypokinesis of the entire anterior, entire inferior, basal inferolateral, basal-mid anterolateral, and apical myocardium; mild hypokinesis of the basal-mid anteroseptal, basal-mid inferoseptal, and apical septal myocardium. - Aortic valve: Transvalvular velocity was within the normal range. There was no stenosis. There was severe regurgitation originating from the central coaptation point. Severe regurgitation is suggested by a regurgitant jet width = 60% of the LVOT diameter, holodiastolic flow reversal in the descending  aorta, a dense continuous wave Doppler signal, and a vena contracta = 6 mm. Regurgitation pressure half-time: 253 ms. - Mitral valve: Unable to quantify mitral regurgitation. PISA imaging was not obtained. Cannot use the continuity equation due to severe aortic regurgitation. Mobility of the posterior leaflet was mildly restricted. Transvalvular velocity was within the normal range. There was no evidence for stenosis. There was moderate regurgitation. - Left atrium: The appendage was severely dilated. - Right ventricle: Systolic function was moderately reduced. - Atrial septum: The septum bowed from left to right, consistent with increased left atrial pressure. - Tricuspid valve: There was mild regurgitation. - Pulmonary arteries: Systolic pressure was within the normal range. PA peak pressure: 29 mm Hg (S). - Pericardium, extracardiac: A trivial pericardial effusion was identified.  ASSESSMENT AND PLAN:  Aortic regurgitation  - Severe on recent echocardiogram.  - Had consultation with Dr. Cyndia Bent.  - We will set up for right and left heart catheterization. Risks and benefits including stroke, heart attack, death, renal impairment discussed.  - Like during Dr. Vivi Martens visit, she does have a depressed affect.  Return of cardiomyopathy dilated cardiomyopathy  - Ejection fraction was 50% most recently but now is 35% again. May now be because of severe aortic regurgitation.  - Symptoms.   - On carvedilol, Lasix, lisinopril  - No current edema  Hyperlipidemia  - Atorvastatin 10 mg.  Essential hypertension  - Blood pressure  normal. Continue with current regimen.   Current medicines are reviewed at length with the patient today.  The patient does not have concerns regarding medicines.  The following changes have been made:    Labs/ tests ordered today include: none     Orders Placed This Encounter  Procedures  . Basic metabolic panel  . CBC   . Protime-INR     Disposition:   FU with Skains in 2 month  Signed, Candee Furbish, MD  01/05/2017 2:28 PM    [facility] [address], [place], [place]  [address] Phone: [phone]; Fax: [phone]     Electronically signed by Jerline Pain, MD at 01/05/2017 2:29 PM

## 2017-01-21 NOTE — Anesthesia Postprocedure Evaluation (Signed)
Anesthesia Post Note  Patient: Miranda [name]  Procedure(s) Performed: Procedure(s) (LRB): Extraction of tooth #'s 4-13 with alveoloplasty (N/A)     Patient location during evaluation: PACU Anesthesia Type: General Level of consciousness: awake and alert Pain management: pain level controlled Vital Signs Assessment: post-procedure vital signs reviewed and stable Respiratory status: spontaneous breathing, nonlabored ventilation, respiratory function stable and patient connected to nasal cannula oxygen Cardiovascular status: blood pressure returned to baseline and stable Postop Assessment: no signs of nausea or vomiting Anesthetic complications: no    Last Vitals:  Vitals:   01/21/17 1053 01/21/17 1104  BP: 127/74 138/74  Pulse: (!) 101   Resp: (!) 27   Temp:      Last Pain:  Vitals:   01/21/17 1104  TempSrc:   PainSc: 0-No pain                 Tiajuana Amass

## 2017-01-22 ENCOUNTER — Encounter (HOSPITAL_COMMUNITY): Payer: Self-pay | Admitting: Dentistry

## 2017-01-23 ENCOUNTER — Telehealth: Payer: Self-pay | Admitting: Cardiology

## 2017-01-23 NOTE — Telephone Encounter (Signed)
Walk In Pt Form-pt would like note stating she cannot make DDS Appts" placed in [facility] doc box

## 2017-01-29 ENCOUNTER — Ambulatory Visit (HOSPITAL_COMMUNITY): Payer: Self-pay | Admitting: Dentistry

## 2017-01-29 ENCOUNTER — Encounter (HOSPITAL_COMMUNITY): Payer: Self-pay | Admitting: Dentistry

## 2017-01-29 VITALS — BP 125/48 | HR 74 | Temp 97.8°F

## 2017-01-29 DIAGNOSIS — K08199 Complete loss of teeth due to other specified cause, unspecified class: Secondary | ICD-10-CM

## 2017-01-29 DIAGNOSIS — K08109 Complete loss of teeth, unspecified cause, unspecified class: Secondary | ICD-10-CM

## 2017-01-29 NOTE — Patient Instructions (Signed)
PLAN: 1. Continue saltwater rinses as needed to aid healing 2. Advance diet as tolerated 3. Follow-up with a dentist of her choice for fabrication of upper and lower complete dentures after adequate healing in 6-8 weeks. 4. Patient is now cleared for heart valve surgery.   Lenn Cal, DDS

## 2017-01-29 NOTE — Progress Notes (Signed)
POST OPERATIVE NOTE:  01/29/2017 Miranda [name] [id number]  VITALS: BP (!) 125/48 (BP Location: Left Arm)   Pulse 74   Temp 97.8 F (36.6 C) (Oral)   LMP 01/17/2015   LABS:  Lab Results  Component Value Date   WBC 5.7 01/14/2017   HGB 12.4 01/14/2017   HCT 40.9 01/14/2017   MCV 88.5 01/14/2017   PLT 180 01/14/2017   BMET    Component Value Date/Time   NA 138 01/14/2017 1005   NA 143 01/05/2017 1433   K 4.1 01/14/2017 1005   CL 108 01/14/2017 1005   CO2 25 01/14/2017 1005   GLUCOSE 78 01/14/2017 1005   BUN 11 01/14/2017 1005   BUN 12 01/05/2017 1433   CREATININE 0.90 01/14/2017 1005   CREATININE 0.86 09/26/2016 1019   CALCIUM 9.1 01/14/2017 1005   GFRNONAA >60 01/14/2017 1005   GFRAA >60 01/14/2017 1005    Lab Results  Component Value Date   INR 1.1 01/05/2017   INR 0.97 10/29/2012   INR 1.02 10/25/2012   No results found for: PTT   Miranda [name] is status post Extraction remaining teeth with alveoloplasty in the operating room with general anesthesia on 01/21/2017. The patient now presents for evaluation of healing and suture removal.  SUBJECTIVE: Patient with minimal complaints. Patient cases several stitches still remain.   EXAM: There is no sign of infection, heme, or ooze. Patient is healing in by generalized primary closure. Several sutures still remain. Patient is now completely edentulous.  PROCEDURE: The patient was given a chlorhexidine gluconate rinse for 30 seconds. Sutures were then removed without complication. Patient tolerated the procedure well.  ASSESSMENT: Post operative course is consistent with dental procedures performed in the operating room. Loss of teeth due to extraction Patient is now completely edentulous  PLAN: 1. Continue saltwater rinses as needed to aid healing 2. Advance diet as tolerated 3. Follow-up with a dentist of her choice for fabrication of upper and lower complete dentures after adequate healing in 6-8  weeks. 4. Patient is now cleared for heart valve surgery.   Lenn Cal, DDS

## 2017-01-30 ENCOUNTER — Encounter: Payer: Self-pay | Admitting: Internal Medicine

## 2017-01-30 ENCOUNTER — Ambulatory Visit (INDEPENDENT_AMBULATORY_CARE_PROVIDER_SITE_OTHER): Payer: PPO | Admitting: Internal Medicine

## 2017-01-30 VITALS — BP 120/80 | HR 64 | Temp 97.9°F | Wt 126.0 lb

## 2017-01-30 DIAGNOSIS — I351 Nonrheumatic aortic (valve) insufficiency: Secondary | ICD-10-CM | POA: Diagnosis not present

## 2017-01-30 DIAGNOSIS — E782 Mixed hyperlipidemia: Secondary | ICD-10-CM

## 2017-01-30 DIAGNOSIS — R440 Auditory hallucinations: Secondary | ICD-10-CM | POA: Diagnosis not present

## 2017-01-30 DIAGNOSIS — Z23 Encounter for immunization: Secondary | ICD-10-CM

## 2017-01-30 DIAGNOSIS — I428 Other cardiomyopathies: Secondary | ICD-10-CM

## 2017-01-30 DIAGNOSIS — E039 Hypothyroidism, unspecified: Secondary | ICD-10-CM

## 2017-01-30 DIAGNOSIS — I5042 Chronic combined systolic (congestive) and diastolic (congestive) heart failure: Secondary | ICD-10-CM | POA: Diagnosis not present

## 2017-01-30 MED ORDER — ATORVASTATIN CALCIUM 20 MG PO TABS: ORAL_TABLET | ORAL | 3 refills | Status: DC

## 2017-01-30 NOTE — Progress Notes (Signed)
Location:  [facility] clinic Provider:  Chisa Kushner L. Mariea Clonts, D.O., C.M.D.  Code Status: full code Goals of Care:  Advanced Directives 01/30/2017  Does Patient Have a Medical Advance Directive? No  Does patient want to make changes to medical advance directive? No - Patient declined  Would patient like information on creating a medical advance directive? -  Pre-existing out of facility DNR order (yellow form or pink MOST form) -   Chief Complaint  Patient presents with  . Medical Management of Chronic Issues    4MTH FOLLOW-UP    HPI: Patient is a 51 y.o. female seen today for medical management of chronic diseases.    Severe AR:  She reports having dizziness, lightheadedness a little bit, and some spurts of chest pain.  She remains short of breath with minimal exertion.  She is not quite as bad with dyspnea during her morning routine (after diuresis), but still somewhat sob with walking or exerting herself.  She is requiring valve surgery due to the severity of this aortic regurgitation and worsening of her systolic chf as a result.  Discussed increasing lipitor to get LDL to less than 70 and she agreed but without enthusiasm.  Had some mild CAD on the cath in LAD and ostial.    She has missed wellbutrin some.  Spirits ok considering health status recently.  Family stress is less.  However, she is having auditory hallucinations of her phone ringing or the doorbell ringing when it is not.  No voices or visual hallucinations.  Her mom has visual hallucinations of people but these seem to be in context of autoimmune liver disease, pt reports.  No one else in family with psychiatric disease.  In prep for her heart surgery, she had to have all of her teeth extracted.  Mouth healing well.  Got sutures removed yesterday.  Is edentulous now.  Was wearing a mask.  Past Medical History:  Diagnosis Date  . Anemia   . Anxiety   . At risk for sudden cardiac death Nov 28, 2012  . CHF - Combined Systolic +  Diastolic Dysfunction. EF of 15-20% with Grade II diastolic dysfunction on echo 10/27/12 10/28/2012  . Coronary artery disease   . Depression   . Dyspnea   . Fibroids   . Heart murmur   . Hypertension   . Hypothyroidism   . S/P cardiac catheterization, Nov 28, 2012, normal coronaries with minimal luminal irregularities in RCA system 10/30/2012  . Thyroid disease    hypothyroidism    Past Surgical History:  Procedure Laterality Date  . LEFT AND RIGHT HEART CATHETERIZATION WITH CORONARY ANGIOGRAM N/A 2012/11/28   Procedure: LEFT AND RIGHT HEART CATHETERIZATION WITH CORONARY ANGIOGRAM;  Surgeon: Leonie Man, MD;  Location: [facility] CATH LAB;  Service: Cardiovascular;  Laterality: N/A;  . MULTIPLE EXTRACTIONS WITH ALVEOLOPLASTY N/A 01/21/2017   Procedure: Extraction of tooth #'s 4-13 with alveoloplasty;  Surgeon: Lenn Cal, DDS;  Location: [place];  Service: Oral Surgery;  Laterality: N/A;  . NO PAST SURGERIES    . RIGHT/LEFT HEART CATH AND CORONARY ANGIOGRAPHY N/A 01/09/2017   Procedure: Right/Left Heart Cath and Coronary Angiography;  Surgeon: Troy Sine, MD;  Location: [place] CV LAB;  Service: Cardiovascular;  Laterality: N/A;    Allergies  Allergen Reactions  . Codeine Hives    Allergies as of 01/30/2017      Reactions   Codeine Hives      Medication List       Accurate as of  01/30/17 10:08 AM. Always use your most recent med list.          acetaminophen 500 MG tablet Commonly known as:  TYLENOL Take 500 mg by mouth daily as needed for headache (pain).   aspirin 81 MG EC tablet Take 1 tablet (81 mg total) by mouth daily.   atorvastatin 10 MG tablet Commonly known as:  LIPITOR TAKE ONE TABLET BY MOUTH ONCE DAILY AT 6 PM   buPROPion 150 MG 24 hr tablet Commonly known as:  WELLBUTRIN XL TAKE ONE TABLET BY MOUTH ONCE DAILY   carvedilol 6.25 MG tablet Commonly known as:  COREG Take 1 tablet (6.25 mg total) by mouth 2 (two) times daily with a meal.   Ferrous  Fumarate 324 (106 Fe) MG Tabs tablet Commonly known as:  HEMOCYTE - 106 mg FE Take 1 tablet (106 mg of iron total) by mouth 2 (two) times daily.   fluticasone 50 MCG/ACT nasal spray Commonly known as:  FLONASE Place 1 spray into both nostrils daily as needed for allergies or rhinitis.   furosemide 40 MG tablet Commonly known as:  LASIX Take 40 mg by mouth daily. Monday and thurday take 60mg    levothyroxine 112 MCG tablet Commonly known as:  SYNTHROID, LEVOTHROID TAKE ONE TABLET BY MOUTH ONCE DAILY BEFORE BREAKFAST   lisinopril 10 MG tablet Commonly known as:  PRINIVIL,ZESTRIL Take 1 tablet (10 mg total) by mouth daily.   mirtazapine 7.5 MG tablet Commonly known as:  REMERON Take 1 tablet (7.5 mg total) by mouth at bedtime.   potassium chloride SA 20 MEQ tablet Commonly known as:  K-DUR,KLOR-CON Take 1 tablet (20 mEq total) by mouth daily.   traMADol 50 MG tablet Commonly known as:  ULTRAM Take 1 tablet (50 mg total) by mouth every 6 (six) hours as needed for moderate pain or severe pain.   traZODone 150 MG tablet Commonly known as:  DESYREL Take 1 tablet (150 mg total) by mouth at bedtime.       Review of Systems:  Review of Systems  Constitutional: Negative for chills and fever.  HENT: Negative for congestion and hearing loss.        Had teeth extracted  Eyes: Positive for double vision. Negative for blurred vision.       Glasses, some chronic diplopia and nystagmus   Respiratory: Positive for shortness of breath. Negative for cough, sputum production and wheezing.   Cardiovascular: Positive for chest pain. Negative for palpitations, orthopnea, claudication, leg swelling and PND.  Gastrointestinal: Negative for abdominal pain, blood in stool, constipation and melena.  Genitourinary: Negative for dysuria, frequency and urgency.  Musculoskeletal: Negative for falls and myalgias.  Neurological: Negative for dizziness and loss of consciousness.  Endo/Heme/Allergies:  Does not bruise/bleed easily.  Psychiatric/Behavioral: Positive for depression and hallucinations. Negative for memory loss and suicidal ideas. The patient is nervous/anxious. The patient does not have insomnia.     Health Maintenance  Topic Date Due  . INFLUENZA VACCINE  03/04/2017  . MAMMOGRAM  02/26/2018  . PAP SMEAR  01/25/2019  . Fecal DNA (Cologuard)  02/14/2019  . TETANUS/TDAP  01/24/2026  . Hepatitis C Screening  Completed  . HIV Screening  Completed    Physical Exam: Vitals:   01/30/17 1004  BP: 120/80  Pulse: 64  Temp: 97.9 F (36.6 C)  TempSrc: Oral  SpO2: 98%  Weight: 126 lb (57.2 kg)   Body mass index is 21.29 kg/m. Physical Exam  Constitutional: She is oriented to  person, place, and time. No distress.  HENT:  Head: Normocephalic and atraumatic.  Edentulous now  Eyes: Pupils are equal, round, and reactive to light.  Neck: Neck supple. No JVD present.  Cardiovascular: Normal rate, regular rhythm and intact distal pulses.   Murmur heard. Pulmonary/Chest: Effort normal and breath sounds normal. No respiratory distress.  Abdominal: Soft. Bowel sounds are normal.  Musculoskeletal: Normal range of motion.  Neurological: She is alert and oriented to person, place, and time.  Skin: Skin is warm and dry. Capillary refill takes less than 2 seconds.  Psychiatric:  Flat affect    Labs reviewed: Basic Metabolic Panel:  Recent Labs  09/26/16 1019  12/03/16 0955 01/05/17 1433 01/14/17 1005  NA 140  < > 142 143 138  K 3.8  < > 4.2 4.1 4.1  CL 108  < > 104 103 108  CO2 21  < > 22 24 25   GLUCOSE 79  < > 80 98 78  BUN 10  < > 15 12 11   CREATININE 0.86  < > 0.80 0.79 0.90  CALCIUM 8.9  < > 9.2 9.3 9.1  TSH 3.62  --   --   --   --   < > = values in this interval not displayed. Liver Function Tests:  Recent Labs  01/14/17 1005  AST 55*  ALT 30  ALKPHOS 101  BILITOT 0.4  PROT 7.5  ALBUMIN 3.0*   No results for input(s): LIPASE, AMYLASE in the last  8760 hours. No results for input(s): AMMONIA in the last 8760 hours. CBC:  Recent Labs  09/26/16 1019 11/27/16 0935 01/05/17 1433 01/14/17 1005  WBC 5.6 6.1 6.1 5.7  NEUTROABS 3,360 3.7  --   --   HGB 11.9 11.0* 13.1 12.4  HCT 37.6 36.8 41.4 40.9  MCV 87.2 94 87 88.5  PLT 218 232 216 180   Lipid Panel:  Recent Labs  09/26/16 1019  CHOL 163  HDL 60  LDLCALC 92  TRIG 54  CHOLHDL 2.7   Lab Results  Component Value Date   HGBA1C 5.7 (H) 11/02/2014   Lab Results  Component Value Date   TSH 3.62 09/26/2016    Procedures since last visit: Reviewed hospital notes, cardiology visit, echo, cath, dental visits  Echo 12/20/16:- Left ventricle: The cavity size was mildly dilated. There was   moderate concentric hypertrophy. Systolic function was moderately   to severely reduced. The estimated ejection fraction was in the   range of 30% to 35%. of the inferolateral myocardium. Doppler   parameters are consistent with a reversible restrictive pattern,   indicative of decreased left ventricular diastolic compliance   and/or increased left atrial pressure (grade 3 diastolic   dysfunction). Doppler parameters are consistent with high   ventricular filling pressure. - Regional wall motion abnormality: Severe hypokinesis of the mid   inferolateral and apical lateral myocardium; moderate hypokinesis   of the entire anterior, entire inferior, basal inferolateral,   basal-mid anterolateral, and apical myocardium; mild hypokinesis   of the basal-mid anteroseptal, basal-mid inferoseptal, and apical   septal myocardium. - Aortic valve: Transvalvular velocity was within the normal range.   There was no stenosis. There was severe regurgitation originating   from the central coaptation point. Severe regurgitation is   suggested by a regurgitant jet width = 60% of the LVOT diameter,   holodiastolic flow reversal in the descending aorta, a dense   continuous wave Doppler signal, and a  vena contracta =  6 mm.   Regurgitation pressure half-time: 253 ms. - Mitral valve: Unable to quantify mitral regurgitation. PISA   imaging was not obtained. Cannot use the continuity equation due   to severe aortic regurgitation. Mobility of the posterior leaflet   was mildly restricted. Transvalvular velocity was within the   normal range. There was no evidence for stenosis. There was   moderate regurgitation. - Left atrium: The appendage was severely dilated. - Right ventricle: Systolic function was moderately reduced. - Atrial septum: The septum bowed from left to right, consistent   with increased left atrial pressure. - Tricuspid valve: There was mild regurgitation. - Pulmonary arteries: Systolic pressure was within the normal   range. PA peak pressure: 29 mm Hg (S). - Pericardium, extracardiac: A trivial pericardial effusion was   identified.  01/09/17 cath and angiography:  Ost RPDA to RPDA lesion, 50 %stenosed. There is severe left ventricular systolic dysfunction.  Hemodynamic findings consistent with moderate pulmonary hypertension.  There is mild (2+) mitral regurgitation.  Dilated cardiomyopathy with severe global LV dysfunction and an ejection fraction of 15%.  There appears to be at least mild to moderate mitral regurgitation. Moderate elevation of right heart pressures with moderate pulmonary hypertension with a PA systolic pressure of 60 mmHg. Mildly dilated aortic root with  moderately severe aortic insufficiency. Mild coronary obstructive disease with a normal LAD, normal small intermediate vessel, normal left circumflex, and a large dominant RCA with smooth 50% ostial PDA narrowing.  Assessment/Plan 1. Severe aortic regurgitation by prior echocardiogram -she is for aortic valve surgery--has upcoming appt with her Dr. 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 Hypothyroidism, unspecified type -cont current levothyroxine and monitor tsh 2x per year  3. Cardiomyopathy, nonischemic ([place]) -worsened with valve regurg, see #1 -cont current med regimen per cardiology  4. Mixed hyperlipidemia -increase lipitor dose to get to LDL<70 due to mild CAD on cath - atorvastatin (LIPITOR) 20 MG tablet; TAKE ONE TABLET BY MOUTH ONCE DAILY AT 6 PM  Dispense: 90 tablet; Refill: 3  5. Auditory hallucinations -she does not want medications for this at this time, ears were clear of wax or any abnormality to cause the doorbell and phone ringing sounds -she also refused to see psychiatry right now -wants to get her physical conditions taken care of first  6. Chronic combined systolic and diastolic congestive heart failure (HCC) - worse with progression of valvular disease -cont regimen per cardiology -lungs clear and no edema or JVD today, still dyspneic  7. Need for 23-polyvalent pneumococcal polysaccharide vaccine - Pneumococcal polysaccharide vaccine 23-valent greater than or equal to 2yo subcutaneous/IM  Labs/tests ordered:   Orders Placed This Encounter  Procedures  . Pneumococcal polysaccharide vaccine 23-valent greater than or equal to 2yo subcutaneous/IM  check tsh next visit  Next appt: f/u postop  Mandy Peeks L. Jazzmen Restivo, D.O. [facility] [address], [place] [address] Cell Phone (Mon-Fri 8am-5pm):  [phone] On Call:  [phone] & follow prompts after 5pm & weekends Office Phone:  [phone] Office Fax:  [phone]

## 2017-02-18 ENCOUNTER — Encounter: Payer: Self-pay | Admitting: Surgery

## 2017-02-18 ENCOUNTER — Ambulatory Visit: Payer: Self-pay | Admitting: Surgery

## 2017-02-18 ENCOUNTER — Ambulatory Visit (INDEPENDENT_AMBULATORY_CARE_PROVIDER_SITE_OTHER): Payer: PPO | Admitting: Surgery

## 2017-02-18 VITALS — BP 143/77 | HR 87 | Resp 20 | Ht 64.5 in | Wt 128.0 lb

## 2017-02-18 DIAGNOSIS — I351 Nonrheumatic aortic (valve) insufficiency: Secondary | ICD-10-CM | POA: Diagnosis not present

## 2017-02-19 ENCOUNTER — Other Ambulatory Visit: Payer: Self-pay

## 2017-02-19 ENCOUNTER — Other Ambulatory Visit: Payer: Self-pay | Admitting: Internal Medicine

## 2017-02-19 DIAGNOSIS — I351 Nonrheumatic aortic (valve) insufficiency: Secondary | ICD-10-CM

## 2017-02-19 DIAGNOSIS — I340 Nonrheumatic mitral (valve) insufficiency: Secondary | ICD-10-CM

## 2017-02-19 DIAGNOSIS — I5042 Chronic combined systolic (congestive) and diastolic (congestive) heart failure: Secondary | ICD-10-CM

## 2017-02-22 ENCOUNTER — Encounter: Payer: Self-pay | Admitting: Surgery

## 2017-02-22 NOTE — Progress Notes (Signed)
HPI:  The patient returns today to discuss the results of her cardiac cath and make further plans for surgery. She underwent dental extractions by Dr. Enrique Sack and has healed up from that. She continues to have some exertional shortness of breath but is not very active.   Current Outpatient Prescriptions  Medication Sig Dispense Refill  . acetaminophen (TYLENOL) 500 MG tablet Take 500 mg by mouth daily as needed for headache (pain).     Marland Kitchen aspirin EC 81 MG EC tablet Take 1 tablet (81 mg total) by mouth daily.    Marland Kitchen atorvastatin (LIPITOR) 20 MG tablet TAKE ONE TABLET BY MOUTH ONCE DAILY AT 6 PM 90 tablet 3  . buPROPion (WELLBUTRIN XL) 150 MG 24 hr tablet TAKE ONE TABLET BY MOUTH ONCE DAILY 30 tablet 5  . Ferrous Fumarate (HEMOCYTE - 106 MG FE) 324 (106 Fe) MG TABS tablet Take 1 tablet (106 mg of iron total) by mouth 2 (two) times daily. 180 tablet 3  . fluticasone (FLONASE) 50 MCG/ACT nasal spray Place 1 spray into both nostrils daily as needed for allergies or rhinitis.    . furosemide (LASIX) 40 MG tablet Take 40 mg by mouth daily. Monday and thurday take 60mg     . levothyroxine (SYNTHROID, LEVOTHROID) 112 MCG tablet TAKE ONE TABLET BY MOUTH ONCE DAILY BEFORE BREAKFAST 30 tablet 4  . lisinopril (PRINIVIL,ZESTRIL) 10 MG tablet Take 1 tablet (10 mg total) by mouth daily. 30 tablet 3  . mirtazapine (REMERON) 7.5 MG tablet Take 1 tablet (7.5 mg total) by mouth at bedtime. 90 tablet 3  . potassium chloride SA (K-DUR,KLOR-CON) 20 MEQ tablet Take 1 tablet (20 mEq total) by mouth daily. 30 tablet 3  . traZODone (DESYREL) 150 MG tablet Take 1 tablet (150 mg total) by mouth at bedtime. 90 tablet 3  . carvedilol (COREG) 6.25 MG tablet TAKE ONE TABLET BY MOUTH TWICE DAILY 180 tablet 1   No current facility-administered medications for this visit.      Physical Exam: BP (!) 143/77   Pulse 87   Resp 20   Ht 5' 4.5" (1.638 m)   Wt 128 lb (58.1 kg)   LMP 01/17/2015   SpO2 99%   BMI 21.63 kg/m   She looks well. Gums are healing well Cardiac exam shows a 2/6 systolic murmur along the RSB and a 3/6 diastolic murmur at the apex. Lungs are clear There is no peripheral edema.  Diagnostic Tests:  Physicians   Panel Physicians Referring Physician Case Authorizing Physician  Troy Sine, MD (Primary)    Procedures   Right/Left Heart Cath and Coronary Angiography  Conclusion     Ost RPDA to RPDA lesion, 50 %stenosed.  There is severe left ventricular systolic dysfunction.  Hemodynamic findings consistent with moderate pulmonary hypertension.  There is mild (2+) mitral regurgitation.   Dilated cardiomyopathy with severe global LV dysfunction and an ejection fraction of 15%.  There appears to be at least mild to moderate mitral regurgitation.  Moderate elevation of right heart pressures with moderate pulmonary hypertension with a PA systolic pressure of 60 mmHg.  Mildly dilated aortic root with  moderately severe aortic insufficiency.  Mild coronary obstructive disease with a normal LAD, normal small intermediate vessel, normal left circumflex, and a large dominant RCA with smooth 50% ostial PDA narrowing.  RECOMMENDATION: The patient will undergo follow-up outpatient evaluation with Dr. Cyndia Bent.   Indications   Severe aortic regurgitation [I35.1 (ICD-10-CM)]  Dilated cardiomyopathy ([place]) [I42.0 (  ICD-10-CM)]  Procedural Details/Technique   Technical Details Ms Ronesha Heenan is a 51 year old female who is referred for right and left heart catheterization prior to consideration of aortic valve surgery. She was recently found to have severe aortic insufficiency with an ejection fraction of 15% as well as mitral valve disease. She has been evaluated by Dr. Cyndia Bent is now scheduled for cardiac catheterization prior to potential surgery.  The patient arrived in the catheterization laboratory in the fasting state. She was premedicated with Versed 2 mg and fentanyl 25  g. Right heart catheterization was done via the right brachial approach with a 5 [place] Swan and pressures were recorded in the RA, are V, PA, and wedge position. Oxygen saturation was obtained in the pulmonary artery. The patient's right radial artery was very small caliber. Her right radial artery was accessed without difficulty and a glide sheath slender, 4/5 sheath was inserted. However, with the very small caliber artery, despite intra-arterial access, there was inadequate flow return. As result, the left heart cath was transitioned to the right femoral approach. The right femoral artery was punctured anteriorly and a 5 French sheath was inserted without difficulty. A 5 Fr pigtail catheter was inserted without difficulty and was advanced to the central aorta. Simultaneous central aorta and pulmonary artery pressures were recorded. Oxygen saturation was obtained in the central aorta. The pigtail catheter was then advanced into the left ventricle. Simultaneous left ventricular and PCW pressures were recorded. Left ventriculography was performed in the RAO projection. The pigtail catheter was then pulled back into the aorta. Supravalvular aortography was performed in the LAO projection. Diagnostic coronary angiography was then performed with 5 [place] Judkins for left and right coronary catheters. All catheters were removed from the patient. Hemostasis was obtained by direct manual pressure. The patient tolerated the procedure well and returned to her room in satisfactory condition.   Estimated blood loss <50 mL.  During this procedure the patient was administered the following to achieve and maintain moderate conscious sedation: Versed 3 mg, Fentanyl 50 mcg, while the patient's heart rate, blood pressure, and oxygen saturation were continuously monitored. The period of conscious sedation was 61 minutes, of which I was present face-to-face 100% of this time.    Coronary Findings   Dominance: Right  Left  Main  Vessel was injected. Vessel is normal in caliber. Vessel is angiographically normal.  Left Anterior Descending  Vessel was injected. Vessel is normal in caliber. Vessel is angiographically normal.  Ramus Intermedius  Vessel was injected. Vessel is small. Vessel is angiographically normal.  Left Circumflex  Vessel was injected. Vessel is normal in caliber. Vessel is angiographically normal.  Right Coronary Artery  The RCA was a large caliber dominant vessel. There was smooth 50% ostial narrowing of the PDA branch.  Right Posterior Descending Artery  Ost RPDA to RPDA lesion, 50% stenosed. The lesion is smooth.  Right Heart   Right Heart Pressures Hemodynamic findings consistent with moderate pulmonary hypertension. RA: a 12. V 8; mean 8 RV: 62/12 PA: 61/31; mean 41  PW: 1 22, v 24; mean 22  AO: 139/63 PA: 60/26; mean 38  LV: 136/27 PW: mean 28  LV: 134/18 Ao: 134/63  Oxygen saturation in the pulmonary artery 55% and in the central aorta 88%.  Cardiac output by the Fick method was 3.7 L/m Cardiac index by the Fick method was 2.3 L/m/m.    Wall Motion              Left  Heart   Left Ventricle The left ventricle is severely dilated. There is severe left ventricular systolic dysfunction. There is severe global hypocontractility with an ejection fraction estimated at 15%.    Mitral Valve There is mild (2+) mitral regurgitation.    Aorta Aortic Root: The aortic root is enlarged. There is moderate to severe aortic regurgitation.    Coronary Diagrams   Diagnostic Diagram       Implants     No implant documentation for this case.  PACS Images   Show images for Cardiac catheterization   Link to Procedure Log   Procedure Log    Hemo Data    Most Recent Value  Fick Cardiac Output 3.69 L/min  Fick Cardiac Output Index 2.26 (L/min)/BSA  Mitral Mean Gradient 10.5 mmHg  Mitral Peak Gradient 3 mmHg  Mitral Valve Area Index 1.2 cm2/BSA  RA A Wave 12 mmHg    RA V Wave 8 mmHg  RA Mean 8 mmHg  RV Systolic Pressure 62 mmHg  RV Diastolic Pressure 8 mmHg  RV EDP 12 mmHg  PA Systolic Pressure 60 mmHg  PA Diastolic Pressure 26 mmHg  PA Mean 38 mmHg  PW A Wave 28 mmHg  PW V Wave 31 mmHg  PW Mean 28 mmHg  AO Systolic Pressure 193 mmHg  AO Diastolic Pressure 63 mmHg  AO Mean 93 mmHg  LV Systolic Pressure 790 mmHg  LV Diastolic Pressure 15 mmHg  LV EDP 27 mmHg  Arterial Occlusion Pressure Extended Systolic Pressure 240 mmHg  Arterial Occlusion Pressure Extended Diastolic Pressure 63 mmHg  Arterial Occlusion Pressure Extended Mean Pressure 93 mmHg  Left Ventricular Apex Extended Systolic Pressure 973 mmHg  Left Ventricular Apex Extended Diastolic Pressure 9 mmHg  Left Ventricular Apex Extended EDP Pressure 18 mmHg  QP/QS 1  TPVR Index 19.01 HRUI  TSVR Index 45.98 HRUI  PVR SVR Ratio 0.22  TPVR/TSVR Ratio 0.41     Impression:  This 51 year old woman has non-ischemic cardiomyopathy with an EF of 30-35% by echo in May and estimated by cardiology to be 15% at cath. I have personally reviewed the cath images and I think her LVEF is probably about 30%. She has severe AI and moderate pulmonary hypertension. There is mild non-obstructive CAD with a 59% ostial PDA lesion. The aortic root has a diameter by echo of 36 mm and the ascending aorta is 38 mm which is not large enough to require replacement. Her LV internal dimensions were dilated on echo with a diastolic dimension of 55 mm. I think AVR is indicated in this patient with NYHA class III symptoms of acute on chronic systolic and diastolic CHF. It is difficult to quantify the amount of MR on echo but this will have to be evaluated by TEE. I think a bioprosthetic valve is probably the best option for her. With depression on chronic medications and chronic cannabis use I think a mechanical valve and long term need for Coumadin would be risky. There is a higher risk of structural valve deterioration in  her age group with a bioprosthetic valve but I think the risk of that is lower than the risk of long term need for Coumadin in this particular patient. I discussed this with her and tried to get a feel for what she thought about being on Coumadin and she says that she sometimes forgets to take her meds for a few days which I think is an absolute contraindication to a mechanical valve and Coumadin. She  is in agreement with a tissue valve.   Plan:  She will be scheduled for AVR using a bioprosthetic valve on 03/12/2017. Her MR will be evaluated in the OR with TEE.   I spent 15 minutes performing this established patient evaluation and > 50% of this time was spent face to face counseling and coordinating the care of this patient's aortic insufficiency.    Gaye Pollack, MD [facility] [phone]

## 2017-03-05 ENCOUNTER — Ambulatory Visit: Payer: PPO | Admitting: Cardiology

## 2017-03-09 ENCOUNTER — Ambulatory Visit: Payer: PPO | Admitting: Nurse Practitioner

## 2017-03-10 ENCOUNTER — Other Ambulatory Visit (HOSPITAL_COMMUNITY): Payer: Self-pay | Admitting: *Deleted

## 2017-03-10 ENCOUNTER — Ambulatory Visit (HOSPITAL_BASED_OUTPATIENT_CLINIC_OR_DEPARTMENT_OTHER)
Admission: RE | Admit: 2017-03-10 | Discharge: 2017-03-10 | Disposition: A | Discharge status: Home / Self Care | Payer: PPO | Source: Ambulatory Visit | Attending: Surgery | Admitting: Surgery

## 2017-03-10 ENCOUNTER — Ambulatory Visit (HOSPITAL_COMMUNITY)
Admission: RE | Admit: 2017-03-10 | Discharge: 2017-03-10 | Disposition: A | Discharge status: Home / Self Care | Payer: PPO | Source: Ambulatory Visit | Attending: Surgery | Admitting: Surgery

## 2017-03-10 ENCOUNTER — Encounter (HOSPITAL_COMMUNITY)
Admission: RE | Admit: 2017-03-10 | Discharge: 2017-03-10 | Disposition: A | Discharge status: Home / Self Care | Payer: PPO | Source: Ambulatory Visit | Attending: Surgery | Admitting: Surgery

## 2017-03-10 ENCOUNTER — Encounter (HOSPITAL_COMMUNITY): Payer: Self-pay

## 2017-03-10 DIAGNOSIS — Z01818 Encounter for other preprocedural examination: Secondary | ICD-10-CM

## 2017-03-10 DIAGNOSIS — R931 Abnormal findings on diagnostic imaging of heart and coronary circulation: Secondary | ICD-10-CM | POA: Diagnosis not present

## 2017-03-10 DIAGNOSIS — D62 Acute posthemorrhagic anemia: Secondary | ICD-10-CM | POA: Diagnosis not present

## 2017-03-10 DIAGNOSIS — I509 Heart failure, unspecified: Secondary | ICD-10-CM | POA: Insufficient documentation

## 2017-03-10 DIAGNOSIS — I351 Nonrheumatic aortic (valve) insufficiency: Secondary | ICD-10-CM

## 2017-03-10 DIAGNOSIS — I272 Pulmonary hypertension, unspecified: Secondary | ICD-10-CM | POA: Diagnosis not present

## 2017-03-10 DIAGNOSIS — E119 Type 2 diabetes mellitus without complications: Secondary | ICD-10-CM | POA: Diagnosis present

## 2017-03-10 DIAGNOSIS — E039 Hypothyroidism, unspecified: Secondary | ICD-10-CM

## 2017-03-10 DIAGNOSIS — I6523 Occlusion and stenosis of bilateral carotid arteries: Secondary | ICD-10-CM

## 2017-03-10 DIAGNOSIS — I429 Cardiomyopathy, unspecified: Secondary | ICD-10-CM | POA: Diagnosis not present

## 2017-03-10 DIAGNOSIS — I110 Hypertensive heart disease with heart failure: Secondary | ICD-10-CM | POA: Diagnosis not present

## 2017-03-10 DIAGNOSIS — Z0183 Encounter for blood typing: Secondary | ICD-10-CM

## 2017-03-10 DIAGNOSIS — R9431 Abnormal electrocardiogram [ECG] [EKG]: Secondary | ICD-10-CM | POA: Insufficient documentation

## 2017-03-10 DIAGNOSIS — Z7982 Long term (current) use of aspirin: Secondary | ICD-10-CM

## 2017-03-10 DIAGNOSIS — J810 Acute pulmonary edema: Secondary | ICD-10-CM | POA: Diagnosis not present

## 2017-03-10 DIAGNOSIS — R079 Chest pain, unspecified: Secondary | ICD-10-CM | POA: Diagnosis not present

## 2017-03-10 DIAGNOSIS — Z87891 Personal history of nicotine dependence: Secondary | ICD-10-CM

## 2017-03-10 DIAGNOSIS — R0602 Shortness of breath: Secondary | ICD-10-CM | POA: Diagnosis not present

## 2017-03-10 DIAGNOSIS — D649 Anemia, unspecified: Secondary | ICD-10-CM

## 2017-03-10 DIAGNOSIS — F329 Major depressive disorder, single episode, unspecified: Secondary | ICD-10-CM | POA: Diagnosis not present

## 2017-03-10 DIAGNOSIS — I5043 Acute on chronic combined systolic (congestive) and diastolic (congestive) heart failure: Secondary | ICD-10-CM | POA: Diagnosis not present

## 2017-03-10 DIAGNOSIS — Z79899 Other long term (current) drug therapy: Secondary | ICD-10-CM | POA: Insufficient documentation

## 2017-03-10 DIAGNOSIS — I340 Nonrheumatic mitral (valve) insufficiency: Secondary | ICD-10-CM

## 2017-03-10 DIAGNOSIS — R918 Other nonspecific abnormal finding of lung field: Secondary | ICD-10-CM | POA: Diagnosis not present

## 2017-03-10 DIAGNOSIS — I080 Rheumatic disorders of both mitral and aortic valves: Secondary | ICD-10-CM

## 2017-03-10 DIAGNOSIS — Z01812 Encounter for preprocedural laboratory examination: Secondary | ICD-10-CM

## 2017-03-10 DIAGNOSIS — I088 Other rheumatic multiple valve diseases: Secondary | ICD-10-CM | POA: Diagnosis not present

## 2017-03-10 DIAGNOSIS — I420 Dilated cardiomyopathy: Secondary | ICD-10-CM | POA: Diagnosis not present

## 2017-03-10 DIAGNOSIS — I517 Cardiomegaly: Secondary | ICD-10-CM | POA: Diagnosis not present

## 2017-03-10 DIAGNOSIS — I2510 Atherosclerotic heart disease of native coronary artery without angina pectoris: Secondary | ICD-10-CM

## 2017-03-10 DIAGNOSIS — Z823 Family history of stroke: Secondary | ICD-10-CM | POA: Diagnosis not present

## 2017-03-10 DIAGNOSIS — I083 Combined rheumatic disorders of mitral, aortic and tricuspid valves: Secondary | ICD-10-CM | POA: Diagnosis not present

## 2017-03-10 DIAGNOSIS — I10 Essential (primary) hypertension: Secondary | ICD-10-CM | POA: Diagnosis not present

## 2017-03-10 DIAGNOSIS — Z8249 Family history of ischemic heart disease and other diseases of the circulatory system: Secondary | ICD-10-CM | POA: Diagnosis not present

## 2017-03-10 LAB — PULMONARY FUNCTION TEST
FEF 25-75 Post: 0.4 L/sec
FEF 25-75 Pre: 0.74 L/sec
FEF2575-%Change-Post: -45 %
FEF2575-%Pred-Post: 16 %
FEF2575-%Pred-Pre: 31 %
FEV1-%Change-Post: -35 %
FEV1-%Pred-Post: 35 %
FEV1-%Pred-Pre: 54 %
FEV1-Post: 0.81 L
FEV1-Pre: 1.25 L
FEV1FVC-%Change-Post: -28 %
FEV1FVC-%Pred-Pre: 72 %
FEV6-%Change-Post: -9 %
FEV6-%Pred-Post: 68 %
FEV6-%Pred-Pre: 76 %
FEV6-Post: 1.93 L
FEV6-Pre: 2.14 L
FEV6FVC-%Pred-Post: 103 %
FEV6FVC-%Pred-Pre: 103 %
FVC-%Change-Post: -9 %
FVC-%Pred-Post: 66 %
FVC-%Pred-Pre: 74 %
FVC-Post: 1.93 L
FVC-Pre: 2.14 L
Post FEV1/FVC ratio: 42 %
Post FEV6/FVC ratio: 100 %
Pre FEV1/FVC ratio: 59 %
Pre FEV6/FVC Ratio: 100 %

## 2017-03-10 LAB — CBC
HCT: 37.7 % (ref 36.0–46.0)
Hemoglobin: 11.9 g/dL — ABNORMAL LOW (ref 12.0–15.0)
MCH: 26.6 pg (ref 26.0–34.0)
MCHC: 31.6 g/dL (ref 30.0–36.0)
MCV: 84.3 fL (ref 78.0–100.0)
Platelets: 182 10*3/uL (ref 150–400)
RBC: 4.47 MIL/uL (ref 3.87–5.11)
RDW: 15.6 % — ABNORMAL HIGH (ref 11.5–15.5)
WBC: 5.4 10*3/uL (ref 4.0–10.5)

## 2017-03-10 LAB — COMPREHENSIVE METABOLIC PANEL
ALT: 51 U/L (ref 14–54)
AST: 76 U/L — ABNORMAL HIGH (ref 15–41)
Albumin: 3.4 g/dL — ABNORMAL LOW (ref 3.5–5.0)
Alkaline Phosphatase: 86 U/L (ref 38–126)
Anion gap: 7 (ref 5–15)
BUN: 12 mg/dL (ref 6–20)
CO2: 24 mmol/L (ref 22–32)
Calcium: 9.3 mg/dL (ref 8.9–10.3)
Chloride: 107 mmol/L (ref 101–111)
Creatinine, Ser: 0.7 mg/dL (ref 0.44–1.00)
GFR calc Af Amer: >60 mL/min (ref >60)
GFR calc non Af Amer: >60 mL/min (ref >60)
Glucose, Bld: 76 mg/dL (ref 65–99)
Potassium: 4.2 mmol/L (ref 3.5–5.1)
Sodium: 138 mmol/L (ref 135–145)
Total Bilirubin: 0.4 mg/dL (ref 0.3–1.2)
Total Protein: 8.2 g/dL — ABNORMAL HIGH (ref 6.5–8.1)

## 2017-03-10 LAB — URINALYSIS, ROUTINE W REFLEX MICROSCOPIC
Bilirubin Urine: NEGATIVE
Glucose, UA: NEGATIVE mg/dL
Ketones, ur: NEGATIVE mg/dL
Leukocytes, UA: NEGATIVE
Nitrite: NEGATIVE
Protein, ur: 100 mg/dL — AB
Specific Gravity, Urine: 1.017 (ref 1.005–1.030)
pH: 5 (ref 5.0–8.0)

## 2017-03-10 LAB — VAS US DOPPLER PRE CABG
LEFT ECA DIAS: 0 cm/s
Left CCA dist dias: -5 cm/s
Left CCA dist sys: -71 cm/s
Left CCA prox dias: 11 cm/s
Left CCA prox sys: 97 cm/s
Left ICA dist dias: -20 cm/s
Left ICA dist sys: 130 cm/s
Left ICA prox dias: -10 cm/s
Left ICA prox sys: -47 cm/s
RIGHT VERTEBRAL DIAS: 9 cm/s
Right CCA prox sys: 90 cm/s
Right cca dist sys: -61 cm/s

## 2017-03-10 LAB — PROTIME-INR
INR: 1.02
Prothrombin Time: 13.4 seconds (ref 11.4–15.2)

## 2017-03-10 LAB — SURGICAL PCR SCREEN
MRSA, PCR: NEGATIVE
Staphylococcus aureus: NEGATIVE

## 2017-03-10 LAB — APTT: aPTT: 36 seconds (ref 24–36)

## 2017-03-10 MED ORDER — ALBUTEROL SULFATE (2.5 MG/3ML) 0.083% IN NEBU
2.5000 mg | INHALATION_SOLUTION | Freq: Once | RESPIRATORY_TRACT | Status: AC
  Administered 2017-03-10: 2.5 mg via RESPIRATORY_TRACT

## 2017-03-10 NOTE — Progress Notes (Signed)
Pre-op Cardiac Surgery  Carotid Findings:  Findings suggest 1-39% internal carotid artery stenosis bilaterally. Vertebral arteries are patent with antegrade flow.  Upper Extremity Right Left  Brachial Pressures 157-Triphasic 157-Triphasic  Radial Waveforms Triphasic Triphasic  Ulnar Waveforms Triphasic Triphasic  Palmar Arch (Allen's Test) Signal reverses with radial compression, decreases <50% with ulnar compression.  Within normal limits.    03/10/2017 9:47 AM Maudry Mayhew, BS, RVT, RDCS, RDMS

## 2017-03-10 NOTE — Pre-Procedure Instructions (Addendum)
Miranda [name]  03/10/2017    Your procedure is scheduled on Thursday, March 12, 2017 at 7:30 AM.   Report to [facility] Entrance "A" Admitting Office at 5:30 AM.   Call this number if you have problems the morning of surgery: [phone]   Questions prior to day of surgery, please call [phone] between 8 & 4 PM.   Remember:  Do not eat food or drink liquids after midnight Wednesday, 03/11/17.  Take these medicines the morning of surgery with A SIP OF WATER: Bupropion (Wellbutrin), carvedilol(coreg) Levothyroxine (Synthroid), Tylenol - if needed.  You may continue your Aspirin prior to surgery, but do not use any other Aspirin products or NSAIDS (Aleve, Ibuprofen, etc) prior to surgery. Stop Herbal medications/all vitamins, soy supplement as of today. No aspirin day of surgery unless instructed by dr Cyndia Bent or skains   Do not wear jewelry, make-up or nail polish.  Do not wear lotions, powders, perfumes or deodorant.  Do not shave 48 hours prior to surgery.   Do not bring valuables to the hospital.  [facility] is not responsible for any belongings or valuables.  Contacts, dentures or bridgework may not be worn into surgery.  Leave your suitcase in the car.  After surgery it may be brought to your room.  For patients admitted to the hospital, discharge time will be determined by your treatment team.  [facility] - Preparing for Surgery  Before surgery, you can play an important role.  Because skin is not sterile, your skin needs to be as free of germs as possible.  You can reduce the number of germs on you skin by washing with CHG (chlorahexidine gluconate) soap before surgery.  CHG is an antiseptic cleaner which kills germs and bonds with the skin to continue killing germs even after washing.  Please DO NOT use if you have an allergy to CHG or antibacterial soaps.  If your skin becomes reddened/irritated stop using the CHG and inform your nurse when you arrive at Short  Stay.  Do not shave (including legs and underarms) for at least 48 hours prior to the first CHG shower.  You may shave your face.  Please follow these instructions carefully:   1.  Shower with CHG Soap the night before surgery and the                    morning of Surgery.  2.  If you choose to wash your hair, wash your hair first as usual with your       normal shampoo.  3.  After you shampoo, rinse your hair and body thoroughly to remove the shampoo.  4.  Use CHG as you would any other liquid soap.  You can apply chg directly       to the skin and wash gently with scrungie or a clean washcloth.  5.  Apply the CHG Soap to your body ONLY FROM THE NECK DOWN.        Do not use on open wounds or open sores.  Avoid contact with your eyes, ears, mouth and genitals (private parts).  Wash genitals (private parts) with your normal soap.  6.  Wash thoroughly, paying special attention to the area where your surgery        will be performed.  7.  Thoroughly rinse your body with warm water from the neck down.  8.  DO NOT shower/wash with your normal soap after using and rinsing off  the CHG Soap.  9.  Pat yourself dry with a clean towel.            10.  Wear clean pajamas.            11.  Place clean sheets on your bed the night of your first shower and do not        sleep with pets.  Day of Surgery  Do not apply any lotions/deodorants the morning of surgery.  Please wear clean clothes to the hospital.   Please read over the fact sheets that you were given.

## 2017-03-10 NOTE — Pre-Procedure Instructions (Signed)
Miranda [name]  03/10/2017    Your procedure is scheduled on Thursday, March 12, 2017 at 7:30 AM.   Report to [facility] Entrance "A" Admitting Office at 5:30 AM.   Call this number if you have problems the morning of surgery: [phone]   Questions prior to day of surgery, please call [phone] between 8 & 4 PM.   Remember:  Do not eat food or drink liquids after midnight Wednesday, 03/11/17.  Take these medicines the morning of surgery with A SIP OF WATER: Bupropion (Wellbutrin), Carvedilol (Coreg), Levothyroxine (Synthroid), Tylenol - if needed.  You may continue your Aspirin prior to surgery, but do not use any other Aspirin products or NSAIDS (Aleve, Ibuprofen, etc) prior to surgery. Stop Herbal medications as of today.   Do not wear jewelry, make-up or nail polish.  Do not wear lotions, powders, perfumes or deodorant.  Do not shave 48 hours prior to surgery.   Do not bring valuables to the hospital.  [facility] is not responsible for any belongings or valuables.  Contacts, dentures or bridgework may not be worn into surgery.  Leave your suitcase in the car.  After surgery it may be brought to your room.  For patients admitted to the hospital, discharge time will be determined by your treatment team.  [facility] - Preparing for Surgery  Before surgery, you can play an important role.  Because skin is not sterile, your skin needs to be as free of germs as possible.  You can reduce the number of germs on you skin by washing with CHG (chlorahexidine gluconate) soap before surgery.  CHG is an antiseptic cleaner which kills germs and bonds with the skin to continue killing germs even after washing.  Please DO NOT use if you have an allergy to CHG or antibacterial soaps.  If your skin becomes reddened/irritated stop using the CHG and inform your nurse when you arrive at Short Stay.  Do not shave (including legs and underarms) for at least 48 hours prior to the first  CHG shower.  You may shave your face.  Please follow these instructions carefully:   1.  Shower with CHG Soap the night before surgery and the                    morning of Surgery.  2.  If you choose to wash your hair, wash your hair first as usual with your       normal shampoo.  3.  After you shampoo, rinse your hair and body thoroughly to remove the shampoo.  4.  Use CHG as you would any other liquid soap.  You can apply chg directly       to the skin and wash gently with scrungie or a clean washcloth.  5.  Apply the CHG Soap to your body ONLY FROM THE NECK DOWN.        Do not use on open wounds or open sores.  Avoid contact with your eyes, ears, mouth and genitals (private parts).  Wash genitals (private parts) with your normal soap.  6.  Wash thoroughly, paying special attention to the area where your surgery        will be performed.  7.  Thoroughly rinse your body with warm water from the neck down.  8.  DO NOT shower/wash with your normal soap after using and rinsing off       the CHG Soap.  9.  Pat yourself  dry with a clean towel.            10.  Wear clean pajamas.            11.  Place clean sheets on your bed the night of your first shower and do not        sleep with pets.  Day of Surgery  Do not apply any lotions/deodorants the morning of surgery.  Please wear clean clothes to the hospital.   Please read over the fact sheets that you were given.

## 2017-03-11 ENCOUNTER — Encounter (HOSPITAL_COMMUNITY): Payer: Self-pay

## 2017-03-11 LAB — HEMOGLOBIN A1C
Hgb A1c MFr Bld: 6.1 % — ABNORMAL HIGH (ref 4.8–5.6)
Mean Plasma Glucose: 128 mg/dL

## 2017-03-11 MED ORDER — HEPARIN SODIUM (PORCINE) 1000 UNIT/ML IJ SOLN
INTRAMUSCULAR | Status: DC
  Filled 2017-03-11: qty 30

## 2017-03-11 MED ORDER — MAGNESIUM SULFATE 50 % IJ SOLN
40.0000 meq | INTRAMUSCULAR | Status: DC
  Filled 2017-03-11: qty 10

## 2017-03-11 MED ORDER — DEXTROSE 5 % IV SOLN
1.5000 g | INTRAVENOUS | Status: AC
  Administered 2017-03-12: 1.5 g via INTRAVENOUS
  Administered 2017-03-12: .75 g via INTRAVENOUS
  Filled 2017-03-11 (×2): qty 1.5

## 2017-03-11 MED ORDER — DEXMEDETOMIDINE HCL IN NACL 400 MCG/100ML IV SOLN
0.1000 ug/kg/h | INTRAVENOUS | Status: AC
  Administered 2017-03-12: .5 ug/kg/h via INTRAVENOUS
  Filled 2017-03-11: qty 100

## 2017-03-11 MED ORDER — DEXTROSE 5 % IV SOLN
750.0000 mg | INTRAVENOUS | Status: DC
  Filled 2017-03-11: qty 750

## 2017-03-11 MED ORDER — TRANEXAMIC ACID (OHS) BOLUS VIA INFUSION
15.0000 mg/kg | INTRAVENOUS | Status: AC
  Administered 2017-03-12: 901.5 mg via INTRAVENOUS
  Filled 2017-03-11: qty 902

## 2017-03-11 MED ORDER — METOPROLOL TARTRATE 12.5 MG HALF TABLET
12.5000 mg | ORAL_TABLET | Freq: Once | ORAL | Status: DC
  Filled 2017-03-11: qty 1

## 2017-03-11 MED ORDER — SODIUM CHLORIDE 0.9 % IV SOLN
30.0000 ug/min | INTRAVENOUS | Status: DC
  Filled 2017-03-11: qty 2

## 2017-03-11 MED ORDER — POTASSIUM CHLORIDE 2 MEQ/ML IV SOLN
80.0000 meq | INTRAVENOUS | Status: DC
Start: 2017-03-12 — End: 2017-03-12
  Filled 2017-03-11: qty 40

## 2017-03-11 MED ORDER — INSULIN REGULAR HUMAN 100 UNIT/ML IJ SOLN
INTRAMUSCULAR | Status: AC
  Administered 2017-03-12: .9 [IU]/h via INTRAVENOUS
  Filled 2017-03-11: qty 1

## 2017-03-11 MED ORDER — PLASMA-LYTE 148 IV SOLN
INTRAVENOUS | Status: AC
  Administered 2017-03-12: 09:00:00
  Filled 2017-03-11: qty 2.5

## 2017-03-11 MED ORDER — TRANEXAMIC ACID (OHS) PUMP PRIME SOLUTION
2.0000 mg/kg | INTRAVENOUS | Status: DC
  Filled 2017-03-11: qty 1.2

## 2017-03-11 MED ORDER — TRANEXAMIC ACID 1000 MG/10ML IV SOLN
1.5000 mg/kg/h | INTRAVENOUS | Status: AC
  Administered 2017-03-12: 1.5 mg/kg/h via INTRAVENOUS
  Filled 2017-03-11: qty 25

## 2017-03-11 MED ORDER — EPINEPHRINE PF 1 MG/ML IJ SOLN
0.0000 ug/min | INTRAVENOUS | Status: DC
  Filled 2017-03-11: qty 4

## 2017-03-11 MED ORDER — DOPAMINE-DEXTROSE 3.2-5 MG/ML-% IV SOLN
0.0000 ug/kg/min | INTRAVENOUS | Status: DC
  Filled 2017-03-11: qty 250

## 2017-03-11 MED ORDER — NITROGLYCERIN IN D5W 200-5 MCG/ML-% IV SOLN
2.0000 ug/min | INTRAVENOUS | Status: DC
  Filled 2017-03-11: qty 250

## 2017-03-11 MED ORDER — CHLORHEXIDINE GLUCONATE 0.12 % MT SOLN
15.0000 mL | Freq: Once | OROMUCOSAL | Status: AC
  Administered 2017-03-12: 15 mL via OROMUCOSAL
  Filled 2017-03-11: qty 15

## 2017-03-11 MED ORDER — VANCOMYCIN HCL 10 G IV SOLR
1250.0000 mg | INTRAVENOUS | Status: AC
  Administered 2017-03-12: 1250 mg via INTRAVENOUS
  Filled 2017-03-11: qty 1250

## 2017-03-11 NOTE — Progress Notes (Signed)
Anesthesia Chart Review:    Pt is a 51 year old female scheduled for aortic valve replacement, possible mitral annuloplasty, mitral valve repair on 03/12/2017 with Gilford Raid, M.D.  multiple dental extractions with alveoloplasty on 01/21/2017 with Teena Dunk, DDS in preparation for AVR with Dr. Cyndia Bent in the future.   - PCP is Hollace Kinnier, DO - Cardiologist is Candee Furbish, MD - CT surgeon is Gilford Raid, MD  PMH includes:  CAD, CHF, cardiomyopathy (EF 15% by cath 01/09/17), severe aortic regurgitation, HTN, anemia, hypothyroidism. Former smoker. BMI 22  Medications include: ASA 81 mg, Lipitor, carvedilol, iron, Lasix, levothyroxine, lisinopril, potassium  Preoperative labs reviewed.  ABG will be obtained DOS.   CXR 03/10/17: Stable moderate cardiomegaly.  No active lung disease.  EKG 01/09/17: NSR. Left atrial enlargement. LVH REPOLARIZATION ABNORMALITY. T wave abnormality, consider inferior ischemia. T wave abnormality, consider anterolateral ischemia. Prolonged QT. Twave changes less prominent compared to previous  Carotid duplex 03/10/17:  - Findings suggest 1-39% internal carotid artery stenosis bilaterally.  - Vertebral arteries are patent with antegrade flow.  Cardiac cath 01/09/17:   Ost RPDA to RPDA lesion, 50% stenosed.  There is severe LV systolic dysfunction.  Hemodynamic findings consistent with moderate pulmonary hypertension.  There is mild (2+) mitral regurgitation. - Dilated cardiomyopathy with severe global LV dysfunction and an ejection fraction of 15%. There appears to be at least mild to moderate mitral regurgitation. - Moderate elevation of right heart pressures with moderate pulmonary hypertension with a PA systolic pressure of 60 mmHg. - Mildly dilated aortic root with moderately severe aortic insufficiency. - Mild coronary obstructive disease with a normal LAD, normal small intermediate vessel, normal left circumflex, and a large dominant RCA with  smooth 50% ostial PDA narrowing.  Echo 12/10/16:  - Left ventricle: The cavity size was mildly dilated. There wasmoderate concentric hypertrophy. Systolic function was moderatelyto severely reduced. The estimated ejection fraction was in therange of 30% to 35%. of the inferolateral myocardium. Dopplerparameters are consistent with a reversible restrictive pattern,indicative of decreased left ventricular diastolic compliance and/or increased left atrial pressure (grade 3 diastolic dysfunction). Doppler parameters are consistent with highventricular filling pressure. - Regional wall motion abnormality: Severe hypokinesis of the midinferolateral and apical lateral myocardium; moderate hypokinesisof the entire anterior, entire inferior, basal inferolateral,basal-mid anterolateral, and apical myocardium; mild hypokinesisof the basal-mid anteroseptal, basal-mid inferoseptal, and apical septal myocardium. - Aortic valve: Transvalvular velocity was within the normal range.There was no stenosis. There was severe regurgitation originatingfrom the central coaptation point. Severe regurgitation issuggested by a regurgitant jet width = 60% of the LVOT diameter,holodiastolic flow reversal in the descending aorta, a densecontinuous wave Doppler signal, and a vena contracta = 6 mm. Regurgitation pressure half-time: 253 ms. - Mitral valve: Unable to quantify mitral regurgitation. PISA imaging was not obtained. Cannot use the continuity equation dueto severe aortic regurgitation. Mobility of the posterior leafletwas mildly restricted. Transvalvular velocity was within thenormal range. There was no evidence for stenosis. There wasmoderate regurgitation. - Left atrium: The appendage was severely dilated. - Right ventricle: Systolic function was moderately reduced. - Atrial septum: The septum bowed from left to right, consistentwith increased left atrial pressure. - Tricuspid valve: There was mild  regurgitation. - Pulmonary arteries: Systolic pressure was within the normalrange. PA peak pressure: 29 mm Hg (S). - Pericardium, extracardiac: A trivial pericardial effusion wasidentified.  If no changes, I anticipate pt can proceed with surgery as scheduled.   Willeen Cass, FNP-BC [facility]/Anesthesiology Phone: [phone] 03/11/2017  9:57 AM

## 2017-03-11 NOTE — Anesthesia Preprocedure Evaluation (Addendum)
Anesthesia Evaluation  Patient identified by MRN, date of birth, ID band Patient awake    Reviewed: Allergy & Precautions, NPO status , Patient's Chart, lab work & pertinent test results, reviewed documented beta blocker date and time   Airway Mallampati: III  TM Distance: >3 FB Neck ROM: Full    Dental  (+) Edentulous Upper, Edentulous Lower   Pulmonary shortness of breath, with exertion and at rest, COPD (mild), former smoker,    breath sounds clear to auscultation       Cardiovascular hypertension, Pt. on medications and Pt. on home beta blockers + CAD, + Peripheral Vascular Disease and +CHF  + Valvular Problems/Murmurs AI and MR  Rhythm:Regular Rate:Normal + Systolic murmurs and + Diastolic murmurs ECG: NSR, rate 70. LAE. LVH  ECHO: - Left ventricle: The cavity size was mildly dilated. There was moderate concentric hypertrophy. Systolic function was moderately to severely reduced. The estimated ejection fraction was in the range of 30% to 35%. of the inferolateral myocardium. Doppler   parameters are consistent with a reversible restrictive pattern, indicative of decreased left ventricular diastolic compliance and/or increased left atrial pressure (grade 3 diastolic dysfunction). Doppler parameters are consistent with high ventricular filling pressure. Regional wall motion abnormality: Severe hypokinesis of the mid inferolateral and apical lateral myocardium; moderate hypokinesis of the entire anterior, entire inferior, basal inferolateral, basal-mid anterolateral, and apical myocardium; mild hypokinesis of the basal-mid anteroseptal, basal-mid inferoseptal, and apical septal myocardium. Aortic valve: Transvalvular velocity was within the normal range. There was no stenosis. There was severe regurgitation originating from the central coaptation point. Severe regurgitation is suggested by a regurgitant jet width = 60% of the LVOT diameter,  holodiastolic flow reversal in the descending aorta, a dense continuous wave Doppler signal, and a vena contracta = 6 mm. Regurgitation pressure half-time: 253 ms. Mitral valve: Unable to quantify mitral regurgitation. PISA imaging was not obtained. Cannot use the continuity equation due to severe aortic regurgitation. Mobility of the posterior leaflet was mildly restricted. Transvalvular velocity was within the normal range. There was no evidence for stenosis. There was moderate regurgitation. Left atrium: The appendage was severely dilated. Right ventricle: Systolic function was moderately reduced. Atrial septum: The septum bowed from left to right, consistent with increased left atrial pressure. Tricuspid valve: There was mild regurgitation. Pulmonary arteries: Systolic pressure was within the normal range. PA peak pressure: 29 mm Hg (S). Pericardium, extracardiac: A trivial pericardial effusion was identified.  CATH:   Dilated cardiomyopathy with severe global LV dysfunction and an ejection fraction of 15%.  There appears to be at least mild to moderate mitral regurgitation.  Moderate elevation of right heart pressures with moderate pulmonary hypertension with a PA systolic pressure of 60 mmHg.  Mildly dilated aortic root with  moderately severe aortic insufficiency.  Mild coronary obstructive disease with a normal LAD, normal small intermediate vessel, normal left circumflex   Neuro/Psych Anxiety Depression negative neurological ROS     GI/Hepatic negative GI ROS, Neg liver ROS,   Endo/Other  Hypothyroidism   Renal/GU negative Renal ROS     Musculoskeletal   Abdominal   Peds  Hematology negative hematology ROS (+)   Anesthesia Other Findings Hyperlipidemia  Reproductive/Obstetrics                           Lab Results  Component Value Date   WBC 5.4 03/10/2017   HGB 11.9 (L) 03/10/2017   HCT 37.7 03/10/2017   MCV  84.3 03/10/2017   PLT  182 03/10/2017   Lab Results  Component Value Date   CREATININE 0.70 03/10/2017   BUN 12 03/10/2017   NA 138 03/10/2017   K 4.2 03/10/2017   CL 107 03/10/2017   CO2 24 03/10/2017   Lab Results  Component Value Date   INR 1.02 03/10/2017   INR 1.1 01/05/2017   INR 0.97 10/29/2012   Cardiac cath 01/09/17:   Ost RPDA to RPDA lesion, 50% stenosed.  There is severe LV systolic dysfunction.  Hemodynamic findings consistent with moderate pulmonary hypertension.  There is mild (2+) mitral regurgitation. - Dilated cardiomyopathy with severe global LV dysfunction and an ejection fraction of 15%. There appears to be at least mild to moderate mitral regurgitation. - Moderate elevation of right heart pressures with moderate pulmonary hypertension with a PA systolic pressure of 60 mmHg. - Mildly dilated aortic root with moderately severe aortic insufficiency. - Mild coronary obstructive disease with a normal LAD, normal small intermediate vessel, normal left circumflex, and a large dominant RCA with smooth 50% ostial PDA narrowing.  Echo 12/10/16:  - Left ventricle: The cavity size was mildly dilated. There wasmoderate concentric hypertrophy. Systolic function was moderatelyto severely reduced. The estimated ejection fraction was in therange of 30% to 35%. of the inferolateral myocardium. Dopplerparameters are consistent with a reversible restrictive pattern,indicative of decreased left ventricular diastolic compliance and/or increased left atrial pressure (grade 3 diastolic dysfunction). Doppler parameters are consistent with highventricular filling pressure. - Regional wall motion abnormality: Severe hypokinesis of the midinferolateral and apical lateral myocardium; moderate hypokinesisof the entire anterior, entire inferior, basal inferolateral,basal-mid anterolateral, and apical myocardium; mild hypokinesisof the basal-mid anteroseptal, basal-mid inferoseptal, and apical septal  myocardium. - Aortic valve: Transvalvular velocity was within the normal range.There was no stenosis. There was severe regurgitation originatingfrom the central coaptation point. Severe regurgitation issuggested by a regurgitant jet width = 60% of the LVOT diameter,holodiastolic flow reversal in the descending aorta, a densecontinuous wave Doppler signal, and a vena contracta = 6 mm. Regurgitation pressure half-time: 253 ms. - Mitral valve: Unable to quantify mitral regurgitation. PISA imaging was not obtained. Cannot use the continuity equation dueto severe aortic regurgitation. Mobility of the posterior leafletwas mildly restricted. Transvalvular velocity was within thenormal range. There was no evidence for stenosis. There wasmoderate regurgitation. - Left atrium: The appendage was severely dilated. - Right ventricle: Systolic function was moderately reduced. - Atrial septum: The septum bowed from left to right, consistentwith increased left atrial pressure. - Tricuspid valve: There was mild regurgitation. - Pulmonary arteries: Systolic pressure was within the normalrange. PA peak pressure: 29 mm Hg (S). - Pericardium, extracardiac: A trivial pericardial effusion wasidentified. Anesthesia Physical  Anesthesia Plan  ASA: IV  Anesthesia Plan: General   Post-op Pain Management:    Induction: Intravenous  PONV Risk Score and Plan: 3 and Ondansetron, Dexamethasone, Propofol, Midazolam and Treatment may vary due to age or medical condition  Airway Management Planned: Oral ETT  Additional Equipment: Arterial line, CVP, PA Cath, TEE and Ultrasound Guidance Line Placement  Intra-op Plan:   Post-operative Plan: Post-operative intubation/ventilation  Informed Consent: I have reviewed the patients History and Physical, chart, labs and discussed the procedure including the risks, benefits and alternatives for the proposed anesthesia with the patient or authorized representative who  has indicated his/her understanding and acceptance.     Plan Discussed with: CRNA  Anesthesia Plan Comments:        Anesthesia Quick Evaluation

## 2017-03-11 NOTE — H&P (Signed)
Miranda [name]       [place] [address]             [phone]      Cardiothoracic Surgery Admission History and Physical   PCP is Gayland Curry, DO Referring Provider is Isaiah Serge, NP      Chief Complaint  Patient presents with  . Aortic Insufficiency and mitral insufficiency        HPI:  The patient is a 51 year old woman with hypertension, hypothyroidism, depression on meds and a history of non-ischemic cardiomyopathy dating back to 2014 when she presented with congestive heart failure and an echo 10/27/2016 showed an EF of 15-20% with diffuse hypokinesis, mild MR and no AS or AI. She had a cath at that time that showed no significant coronary disease. She was treated medically and a follow up echo in 01/2013 showed some improvement in the LVEF to 35-40%. Her next echo in 10/2013 showed further improvement in the EF to 50% with no AS or AI. There was mild aortic root dilation to 37 mm. She was seen in 04/2016 and was doing well on an ACE I and BB with no diuretic. She presented in April with sudden onset of shortness of breath and swelling in her legs and abdomen. She was started on lasix but her SOB did not improve until her lasix was titrated up for a few days. She had a follow up echo on 5/9 that showed new severe central AI with an drop in her LVEF to 30-35% with diffuse hypokinesis and grade 3 diastolic dysfunction. There was also moderate MR with a mildly restricted posterior leaflet and annular dilation. She was seen by me on 01/01/2017 and underwent dental evaluation and extraction by Dr. Enrique Sack.  She lives alone and takes care of herself. She does not work and is on disability. She has depression on meds but it has been fairly well controlled and she has not been hospitalized for it. Her father is her closest family and is with her today but stayed in the lobby.       Past Medical History:  Diagnosis Date  . Anemia   . At risk for sudden  cardiac death 2012-11-01  . CHF - Combined Systolic + Diastolic Dysfunction. EF of 15-20% with Grade II diastolic dysfunction on echo 10/27/12 10/28/2012  . Fibroids   . Hypertension   . S/P cardiac catheterization, 11/01/2012, normal coronaries with minimal luminal irregularities in RCA system 10/30/2012  . Thyroid disease    hypothyroidism         Past Surgical History:  Procedure Laterality Date  . LEFT AND RIGHT HEART CATHETERIZATION WITH CORONARY ANGIOGRAM N/A November 01, 2012   Procedure: LEFT AND RIGHT HEART CATHETERIZATION WITH CORONARY ANGIOGRAM;  Surgeon: Leonie Man, MD;  Location: [facility] CATH LAB;  Service: Cardiovascular;  Laterality: N/A;         Family History  Problem Relation Age of Onset  . Cancer Paternal Grandfather        Bone  . Hypertension Mother   . Autoimmune disease Mother   . Stroke Father   . Hypertension Father   . Hypertension Maternal Grandmother   . Breast cancer Cousin   . Cancer Maternal Uncle        bone  . Arthritis Paternal Uncle     Social History        Social History  Substance Use Topics  . Smoking status: Former  Smoker    Packs/day: 0.50    Years: 5.00    Types: Cigars, Cigarettes    Quit date: 08/04/1978  . Smokeless tobacco: Never Used     Comment: 1 cigar daily-quit 10/24/12    . Alcohol use 1.8 oz/week     3 Glasses of wine per week      Comment: a week          Current Outpatient Prescriptions  Medication Sig Dispense Refill  . acetaminophen (TYLENOL) 500 MG tablet Take 500 mg by mouth as needed for headache (pain).     Marland Kitchen amoxicillin (AMOXIL) 250 MG capsule Take 1 capsule (250 mg total) by mouth 3 (three) times daily. Till finished 15 capsule 0  . aspirin EC 81 MG EC tablet Take 1 tablet (81 mg total) by mouth daily.    Marland Kitchen atorvastatin (LIPITOR) 10 MG tablet TAKE ONE TABLET BY MOUTH ONCE DAILY AT 6 PM 90 tablet 3  . buPROPion (WELLBUTRIN XL) 150 MG 24 hr tablet TAKE ONE TABLET BY MOUTH  ONCE DAILY 30 tablet 5  . carvedilol (COREG) 6.25 MG tablet Take 1 tablet (6.25 mg total) by mouth 2 (two) times daily with a meal. 180 tablet 3  . Ferrous Fumarate (HEMOCYTE - 106 MG FE) 324 (106 Fe) MG TABS tablet Take 1 tablet (106 mg of iron total) by mouth 2 (two) times daily. 180 tablet 3  . furosemide (LASIX) 40 MG tablet Take 1 tablet (40 mg total) by mouth daily. EXCEPT ON MON. AND THURS. TAKE TABLET AND HALF 45 tablet 3  . levothyroxine (SYNTHROID, LEVOTHROID) 112 MCG tablet TAKE ONE TABLET BY MOUTH ONCE DAILY BEFORE BREAKFAST 30 tablet 4  . lisinopril (PRINIVIL,ZESTRIL) 10 MG tablet Take 1 tablet (10 mg total) by mouth daily. 30 tablet 3  . mirtazapine (REMERON) 7.5 MG tablet Take 1 tablet (7.5 mg total) by mouth at bedtime. 90 tablet 3  . potassium chloride SA (K-DUR,KLOR-CON) 20 MEQ tablet Take 1 tablet (20 mEq total) by mouth daily. 30 tablet 3  . traZODone (DESYREL) 150 MG tablet Take 1 tablet (150 mg total) by mouth at bedtime. 90 tablet 3   No current facility-administered medications for this visit.         Allergies  Allergen Reactions  . Codeine Hives    Review of Systems  Constitutional: Positive for activity change and fatigue.  HENT: Positive for dental problem.        Has some loose teeth and has not seen a dentist in years. No pain in teeth or jaw.  Eyes: Negative.   Respiratory: Negative.   Cardiovascular: Negative for chest pain.       Exertional shortness of breath  Had leg swelling but resolved with diuretic  Gastrointestinal: Negative.   Endocrine: Negative.   Genitourinary: Negative.   Musculoskeletal: Negative.   Allergic/Immunologic: Negative.   Neurological: Negative for dizziness and syncope.  Hematological: Negative.   Psychiatric/Behavioral:       Depression on meds.    BP (!) 163/87   Pulse 88   Resp 20   Ht 5' 4.5" (1.638 m)   Wt 128 lb (58.1 kg)   LMP 01/17/2015   SpO2 97% Comment: RA  BMI 21.63 kg/m  Physical Exam    Constitutional: She is oriented to person, place, and time. She appears well-developed and well-nourished. No distress.  HENT:  Head: Normocephalic and atraumatic.  Mouth/Throat: Oropharynx is clear and moist.  Teeth look in fair condition  Eyes:  EOM are normal. Pupils are equal, round, and reactive to light.  Neck: Normal range of motion. Neck supple. No JVD present. No thyromegaly present.  Cardiovascular: Normal rate, regular rhythm and intact distal pulses.   Murmur heard. 2/6 systolic murmur RSB and apex  3/6 diastolic AI murmur at apex  Pulmonary/Chest: Effort normal and breath sounds normal. No respiratory distress. She has no wheezes. She has no rales.  Abdominal: Soft. Bowel sounds are normal. She exhibits no distension. There is no tenderness.  Musculoskeletal: Normal range of motion. She exhibits no edema.  Lymphadenopathy:    She has no cervical adenopathy.  Neurological: She is alert and oriented to person, place, and time. She has normal strength. No cranial nerve deficit or sensory deficit.  Left/right nystagmus  Skin: Skin is warm and dry.  Psychiatric:  Flat affect    Diagnostic Tests:  Result status: Final result   [facility] [address], [place] [address] [phone]  ------------------------------------------------------------------- Echocardiography  Patient: Alessa, Mazur MR #: [id number] Study Date: 12/10/2016 Gender: F Age: 26 Height: 163.8 cm Weight: 59.6 kg BSA: 1.65 m^2 Pt. Status: Room:  Aram Beecham REFERRING Cecilie Kicks R SONOGRAPHER Cindy Hazy, RDCS REFERRING [place], [place], Outpatient ATTENDING Skeet Latch, MD  cc:  ------------------------------------------------------------------- LV EF: 30% -  35%  ------------------------------------------------------------------- Indications: R06.02 Shortness of Breath.  ------------------------------------------------------------------- History: PMH: Acquired from the patient and from the patient&'s chart. PMH: CHF. Anemia. Hypothyroidism. Risk factors: Hypertension.  ------------------------------------------------------------------- Study Conclusions  - Left ventricle: The cavity size was mildly dilated. There was moderate concentric hypertrophy. Systolic function was moderately to severely reduced. The estimated ejection fraction was in the range of 30% to 35%. of the inferolateral myocardium. Doppler parameters are consistent with a reversible restrictive pattern, indicative of decreased left ventricular diastolic compliance and/or increased left atrial pressure (grade 3 diastolic dysfunction). Doppler parameters are consistent with high ventricular filling pressure. - Regional wall motion abnormality: Severe hypokinesis of the mid inferolateral and apical lateral myocardium; moderate hypokinesis of the entire anterior, entire inferior, basal inferolateral, basal-mid anterolateral, and apical myocardium; mild hypokinesis of the basal-mid anteroseptal, basal-mid inferoseptal, and apical septal myocardium. - Aortic valve: Transvalvular velocity was within the normal range. There was no stenosis. There was severe regurgitation originating from the central coaptation point. Severe regurgitation is suggested by a regurgitant jet width = 60% of the LVOT diameter, holodiastolic flow reversal in the descending aorta, a dense continuous wave Doppler signal, and a vena contracta = 6 mm. Regurgitation pressure half-time: 253 ms. - Mitral valve: Unable to quantify mitral regurgitation. PISA imaging was not obtained. Cannot use the continuity equation due to severe aortic  regurgitation. Mobility of the posterior leaflet was mildly restricted. Transvalvular velocity was within the normal range. There was no evidence for stenosis. There was moderate regurgitation. - Left atrium: The appendage was severely dilated. - Right ventricle: Systolic function was moderately reduced. - Atrial septum: The septum bowed from left to right, consistent with increased left atrial pressure. - Tricuspid valve: There was mild regurgitation. - Pulmonary arteries: Systolic pressure was within the normal range. PA peak pressure: 29 mm Hg (S). - Pericardium, extracardiac: A trivial pericardial effusion was identified.  ------------------------------------------------------------------- Labs, prior tests, procedures, and surgery: ECG. Abnormal.  ------------------------------------------------------------------- Study data: Study status: Routine. Procedure: The patient reported no pain pre or post test. Transthoracic echocardiography for left ventricular function evaluation, for right ventricular function evaluation, and for assessment of valvular function. Image  quality was adequate. Study completion: There were no complications. Echocardiography. M-mode, complete 2D, spectral Doppler, and color Doppler. Birthdate: Patient birthdate: September 28, 1965. Age: Patient is 51 yr old. Sex: Gender: female. BMI: 22.2 kg/m^2. Blood pressure: 140/74 Patient status: Outpatient. Study date: Study date: 12/10/2016. Study time: 08:45 AM. Location: [facility]  -------------------------------------------------------------------  ------------------------------------------------------------------- Left ventricle: The cavity size was mildly dilated. There was moderate concentric hypertrophy. Systolic function was moderately to severely reduced. The estimated ejection fraction was in the range of 30% to 35%. Regional wall motion  abnormalities: of the inferolateral myocardium. Severe hypokinesis of the mid inferolateral and apical lateral myocardium; moderate hypokinesis of the entire anterior, entire inferior, basal inferolateral, basal-mid anterolateral, and apical myocardium; mild hypokinesis of the basal-mid anteroseptal, basal-mid inferoseptal, and apical septal myocardium. Doppler parameters are consistent with a reversible restrictive pattern, indicative of decreased left ventricular diastolic compliance and/or increased left atrial pressure (grade 3 diastolic dysfunction). Doppler parameters are consistent with high ventricular filling pressure.  ------------------------------------------------------------------- Aortic valve: Trileaflet; normal thickness leaflets. Mobility was not restricted. Doppler: Transvalvular velocity was within the normal range. There was no stenosis. There was severe regurgitation originating from the central coaptation point. Severe regurgitation is suggested by a regurgitant jet width = 60% of the LVOT diameter, holodiastolic flow reversal in the descending aorta, a dense continuous wave Doppler signal, and a vena contracta = 6 mm.  ------------------------------------------------------------------- Aorta: Aortic root: The aortic root was normal in size. Ascending aorta: The ascending aorta was mildly dilated.  ------------------------------------------------------------------- Mitral valve: Unable to quantify mitral regurgitation. PISA imaging was not obtained. Cannot use the continuity equation due to severe aortic regurgitation. Mobility of the posterior leaflet was mildly restricted. Doppler: Transvalvular velocity was within the normal range. There was no evidence for stenosis. There was moderate regurgitation. Peak gradient (D): 5 mm Hg.  ------------------------------------------------------------------- Left atrium: The atrium was normal in size.  The appendage was severely dilated.  ------------------------------------------------------------------- Atrial septum: The septum bowed from left to right, consistent with increased left atrial pressure.  ------------------------------------------------------------------- Right ventricle: The cavity size was normal. Wall thickness was normal. Systolic function was moderately reduced.  ------------------------------------------------------------------- Pulmonic valve: Doppler: Transvalvular velocity was within the normal range. There was no evidence for stenosis.  ------------------------------------------------------------------- Tricuspid valve: Structurally normal valve. Doppler: Transvalvular velocity was within the normal range. There was mild regurgitation.  ------------------------------------------------------------------- Pulmonary artery: The main pulmonary artery was normal-sized. Systolic pressure was within the normal range.  ------------------------------------------------------------------- Right atrium: The atrium was normal in size.  ------------------------------------------------------------------- Pericardium: A trivial pericardial effusion was identified.  ------------------------------------------------------------------- Systemic veins: Inferior vena cava: The vessel was normal in size. The respirophasic diameter changes were in the normal range (= 50%), consistent with normal central venous pressure.  ------------------------------------------------------------------- Measurements  Left ventricle Value Reference LV ID, ED, PLAX chordal (H) 55.5 mm 43 - 52 LV ID, ES, PLAX chordal (H) 46.1 mm 23 - 38 LV fx shortening, PLAX chordal (L) 17 % >=29 LV PW thickness, ED 12.8 mm --------- IVS/LV PW ratio, ED  1.1 <=1.3 Stroke volume, 2D 61 ml --------- Stroke volume/bsa, 2D 37 ml/m^2 --------- LV e&', lateral 5.04 cm/s --------- LV E/e&', lateral 21.43 --------- LV e&', medial 4.17 cm/s --------- LV E/e&', medial 25.9 --------- LV e&', average 4.61 cm/s --------- LV E/e&', average 23.45 ---------  Ventricular septum Value Reference IVS thickness, ED 14.1 mm ---------  LVOT Value Reference LVOT ID, S 21 mm --------- LVOT area 3.46 cm^2 --------- LVOT ID 21 mm --------- LVOT peak  velocity, S 102 cm/s --------- LVOT mean velocity, S 72.4 cm/s --------- LVOT VTI, S 17.7 cm --------- LVOT peak gradient, S 4 mm Hg --------- Stroke volume (SV), LVOT DP 61.3 ml --------- Stroke index (SV/bsa), LVOT DP 37.1 ml/m^2 ---------  Aortic valve Value Reference Aortic regurg peak velocity 516.79 cm/s --------- Aortic regurg deceleration 593 cm/s^2 --------- Aortic regurg deceleration time 872 ms --------- Aortic regurg pressure half-time 253 ms --------- Aortic regurg peak gradient 107 mm Hg ---------  Aorta Value Reference Aortic root ID, ED 36 mm --------- Ascending  aorta ID, A-P, S 38 mm ---------  Left atrium Value Reference LA ID, A-P, ES 59 mm --------- LA ID/bsa, A-P (H) 3.57 cm/m^2 <=2.2 LA volume, S 130 ml --------- LA volume/bsa, S 78.8 ml/m^2 --------- LA volume, ES, 1-p A4C 113 ml --------- LA volume/bsa, ES, 1-p A4C 68.5 ml/m^2 --------- LA volume, ES, 1-p A2C 141 ml --------- LA volume/bsa, ES, 1-p A2C 85.4 ml/m^2 ---------  Mitral valve Value Reference Mitral E-wave peak velocity 108 cm/s --------- Mitral A-wave peak velocity 44.4 cm/s --------- Mitral deceleration time (L) 123 ms 150 - 230 Mitral peak gradient, D 5 mm Hg --------- Mitral E/A ratio, peak 2.4 ---------  Pulmonary arteries Value Reference PA pressure, S, DP 29 mm Hg <=30  Tricuspid valve Value Reference Tricuspid regurg peak velocity 256 cm/s --------- Tricuspid peak RV-RA gradient 26 mm Hg ---------  Systemic veins Value Reference Estimated CVP 3 mm Hg ---------  Right ventricle Value Reference RV pressure, S, DP 29 mm Hg <=30 RV s&', lateral, S 10.9 cm/s ---------  Legend: (L) and (H) mark values outside specified reference range.  ------------------------------------------------------------------- Prepared and Electronically Authenticated by  Skeet Latch,  MD 2018-05-09T12:02:23    Physicians   Panel Physicians Referring Physician Case Authorizing Physician  Troy Sine, MD (Primary)    Procedures   Right/Left Heart Cath and Coronary Angiography  Conclusion     Ost RPDA to RPDA lesion, 50 %stenosed.  There is severe left ventricular systolic dysfunction.  Hemodynamic findings consistent with moderate pulmonary hypertension.  There is mild (2+) mitral regurgitation.   Dilated cardiomyopathy with severe global LV dysfunction and an ejection fraction of 15%.  There appears to be at least mild to moderate mitral regurgitation.  Moderate elevation of right heart pressures with moderate pulmonary hypertension with a PA systolic pressure of 60 mmHg.  Mildly dilated aortic root with  moderately severe aortic insufficiency.  Mild coronary obstructive disease with a normal LAD, normal small intermediate vessel, normal left circumflex, and a large dominant RCA with smooth 50% ostial PDA narrowing.  RECOMMENDATION: The patient will undergo follow-up outpatient evaluation with Dr. Cyndia Bent.   Indications   Severe aortic regurgitation [I35.1 (ICD-10-CM)]  Dilated cardiomyopathy (HCC) [I42.0 (ICD-10-CM)]  Procedural Details/Technique   Technical Details Ms Shamina Etheridge is a 51 year old female who is referred for right and left heart catheterization prior to consideration of aortic valve surgery. She was recently found to have severe aortic insufficiency with an ejection fraction of 15% as well as mitral valve disease. She has been evaluated by Dr. Cyndia Bent is now scheduled for cardiac catheterization prior to potential surgery.  The patient arrived in the catheterization laboratory in the fasting state. She was premedicated with Versed 2 mg and fentanyl 25 g. Right heart catheterization was done via the right brachial approach with a 5 [place] Swan and pressures were recorded in the RA, are V, PA, and wedge position. Oxygen  saturation was obtained in the  pulmonary artery. The patient's right radial artery was very small caliber. Her right radial artery was accessed without difficulty and a glide sheath slender, 4/5 sheath was inserted. However, with the very small caliber artery, despite intra-arterial access, there was inadequate flow return. As result, the left heart cath was transitioned to the right femoral approach. The right femoral artery was punctured anteriorly and a 5 French sheath was inserted without difficulty. A 5 Fr pigtail catheter was inserted without difficulty and was advanced to the central aorta. Simultaneous central aorta and pulmonary artery pressures were recorded. Oxygen saturation was obtained in the central aorta. The pigtail catheter was then advanced into the left ventricle. Simultaneous left ventricular and PCW pressures were recorded. Left ventriculography was performed in the RAO projection. The pigtail catheter was then pulled back into the aorta. Supravalvular aortography was performed in the LAO projection. Diagnostic coronary angiography was then performed with 5 [place] Judkins for left and right coronary catheters. All catheters were removed from the patient. Hemostasis was obtained by direct manual pressure. The patient tolerated the procedure well and returned to her room in satisfactory condition.   Estimated blood loss <50 mL.  During this procedure the patient was administered the following to achieve and maintain moderate conscious sedation: Versed 3 mg, Fentanyl 50 mcg, while the patient's heart rate, blood pressure, and oxygen saturation were continuously monitored. The period of conscious sedation was 61 minutes, of which I was present face-to-face 100% of this time.    Coronary Findings   Dominance: Right  Left Main  Vessel was injected. Vessel is normal in caliber. Vessel is angiographically normal.  Left Anterior Descending  Vessel was injected. Vessel is normal in caliber.  Vessel is angiographically normal.  Ramus Intermedius  Vessel was injected. Vessel is small. Vessel is angiographically normal.  Left Circumflex  Vessel was injected. Vessel is normal in caliber. Vessel is angiographically normal.  Right Coronary Artery  The RCA was a large caliber dominant vessel. There was smooth 50% ostial narrowing of the PDA branch.  Right Posterior Descending Artery  Ost RPDA to RPDA lesion, 50% stenosed. The lesion is smooth.  Right Heart   Right Heart Pressures Hemodynamic findings consistent with moderate pulmonary hypertension. RA: a 12. V 8; mean 8 RV: 62/12 PA: 61/31; mean 41  PW: 1 22, v 24; mean 22  AO: 139/63 PA: 60/26; mean 38  LV: 136/27 PW: mean 28  LV: 134/18 Ao: 134/63  Oxygen saturation in the pulmonary artery 55% and in the central aorta 88%.  Cardiac output by the Fick method was 3.7 L/m Cardiac index by the Fick method was 2.3 L/m/m.    Wall Motion              Left Heart   Left Ventricle The left ventricle is severely dilated. There is severe left ventricular systolic dysfunction. There is severe global hypocontractility with an ejection fraction estimated at 15%.    Mitral Valve There is mild (2+) mitral regurgitation.    Aorta Aortic Root: The aortic root is enlarged. There is moderate to severe aortic regurgitation.    Coronary Diagrams   Diagnostic Diagram       Implants     No implant documentation for this case.  PACS Images   Show images for Cardiac catheterization   Link to Procedure Log   Procedure Log    Hemo Data    Most Recent Value  Fick Cardiac Output 3.69 L/min  Fick Cardiac Output Index  2.26 (L/min)/BSA  Mitral Mean Gradient 10.5 mmHg  Mitral Peak Gradient 3 mmHg  Mitral Valve Area Index 1.2 cm2/BSA  RA A Wave 12 mmHg  RA V Wave 8 mmHg  RA Mean 8 mmHg  RV Systolic Pressure 62 mmHg  RV Diastolic Pressure 8 mmHg  RV EDP 12 mmHg  PA Systolic Pressure 60 mmHg  PA Diastolic Pressure 26  mmHg  PA Mean 38 mmHg  PW A Wave 28 mmHg  PW V Wave 31 mmHg  PW Mean 28 mmHg  AO Systolic Pressure 759 mmHg  AO Diastolic Pressure 63 mmHg  AO Mean 93 mmHg  LV Systolic Pressure 163 mmHg  LV Diastolic Pressure 15 mmHg  LV EDP 27 mmHg  Arterial Occlusion Pressure Extended Systolic Pressure 846 mmHg  Arterial Occlusion Pressure Extended Diastolic Pressure 63 mmHg  Arterial Occlusion Pressure Extended Mean Pressure 93 mmHg  Left Ventricular Apex Extended Systolic Pressure 659 mmHg  Left Ventricular Apex Extended Diastolic Pressure 9 mmHg  Left Ventricular Apex Extended EDP Pressure 18 mmHg  QP/QS 1  TPVR Index 19.01 HRUI  TSVR Index 45.98 HRUI  PVR SVR Ratio 0.22  TPVR/TSVR Ratio 0.41    Impression:  This 51 year old woman has non-ischemic cardiomyopathy with an EF of 30-35% by echo in May and estimated by cardiology to be 15% at cath. I have personally reviewed the cath images and I think her LVEF is probably about 30%. She has severe AI and moderate pulmonary hypertension. There is mild non-obstructive CAD with a 50% ostial PDA lesion. The aortic root has a diameter by echo of 36 mm and the ascending aorta is 38 mm which is not large enough to require replacement. Her LV internal dimensions were dilated on echo with a diastolic dimension of 55 mm. I think AVR is indicated in this patient with NYHA class III symptoms of acute on chronic systolic and diastolic CHF. It is difficult to quantify the amount of MR on echo but this will have to be evaluated by TEE. I think a bioprosthetic valve is probably the best option for her. With depression on chronic medications and chronic cannabis use I think a mechanical valve and long term need for Coumadin would be risky. There is a higher risk of structural valve deterioration in her age group with a bioprosthetic valve but I think the risk of that is lower than the risk of long term need for Coumadin in this particular patient. I discussed this with  her and tried to get a feel for what she thought about being on Coumadin and she says that she sometimes forgets to take her meds for a few days which I think is an absolute contraindication to a mechanical valve and Coumadin. She is in agreement with a tissue valve. I discussed the operative procedure with the patient  including alternatives, benefits and risks; including but not limited to bleeding, blood transfusion, infection, stroke, myocardial infarction, graft failure, heart block requiring a permanent pacemaker, organ dysfunction, and death.  Shena Machamer understands and agrees to proceed.    Plan:  AVR using a bioprosthetic valve on 03/12/2017. Her MR will be evaluated in the OR with TEE.   Gaye Pollack, MD [facility] [phone]

## 2017-03-12 ENCOUNTER — Inpatient Hospital Stay (HOSPITAL_COMMUNITY): Payer: PPO | Admitting: Emergency Medicine

## 2017-03-12 ENCOUNTER — Inpatient Hospital Stay (HOSPITAL_COMMUNITY): Payer: PPO

## 2017-03-12 ENCOUNTER — Encounter (HOSPITAL_COMMUNITY): Payer: Self-pay | Admitting: *Deleted

## 2017-03-12 ENCOUNTER — Inpatient Hospital Stay (HOSPITAL_COMMUNITY): Payer: PPO | Admitting: Anesthesiology

## 2017-03-12 ENCOUNTER — Encounter (HOSPITAL_COMMUNITY)
Admission: RE | Disposition: A | Discharge status: Home / Self Care | Payer: Self-pay | Source: Ambulatory Visit | Attending: Surgery

## 2017-03-12 ENCOUNTER — Inpatient Hospital Stay (HOSPITAL_COMMUNITY)
Admission: RE | Admit: 2017-03-12 | Discharge: 2017-03-19 | DRG: 219 | Disposition: A | Discharge status: Home / Self Care | Payer: PPO | Source: Ambulatory Visit | Attending: Surgery | Admitting: Surgery

## 2017-03-12 DIAGNOSIS — I351 Nonrheumatic aortic (valve) insufficiency: Secondary | ICD-10-CM

## 2017-03-12 DIAGNOSIS — Z8679 Personal history of other diseases of the circulatory system: Secondary | ICD-10-CM

## 2017-03-12 DIAGNOSIS — E039 Hypothyroidism, unspecified: Secondary | ICD-10-CM | POA: Diagnosis not present

## 2017-03-12 DIAGNOSIS — I420 Dilated cardiomyopathy: Secondary | ICD-10-CM | POA: Diagnosis not present

## 2017-03-12 DIAGNOSIS — I5043 Acute on chronic combined systolic (congestive) and diastolic (congestive) heart failure: Secondary | ICD-10-CM | POA: Diagnosis not present

## 2017-03-12 DIAGNOSIS — Z823 Family history of stroke: Secondary | ICD-10-CM

## 2017-03-12 DIAGNOSIS — Z8249 Family history of ischemic heart disease and other diseases of the circulatory system: Secondary | ICD-10-CM

## 2017-03-12 DIAGNOSIS — I083 Combined rheumatic disorders of mitral, aortic and tricuspid valves: Secondary | ICD-10-CM | POA: Diagnosis not present

## 2017-03-12 DIAGNOSIS — I110 Hypertensive heart disease with heart failure: Secondary | ICD-10-CM | POA: Diagnosis not present

## 2017-03-12 DIAGNOSIS — I2510 Atherosclerotic heart disease of native coronary artery without angina pectoris: Secondary | ICD-10-CM | POA: Diagnosis present

## 2017-03-12 DIAGNOSIS — I272 Pulmonary hypertension, unspecified: Secondary | ICD-10-CM | POA: Diagnosis present

## 2017-03-12 DIAGNOSIS — Z9889 Other specified postprocedural states: Secondary | ICD-10-CM

## 2017-03-12 DIAGNOSIS — D62 Acute posthemorrhagic anemia: Secondary | ICD-10-CM | POA: Diagnosis not present

## 2017-03-12 DIAGNOSIS — I340 Nonrheumatic mitral (valve) insufficiency: Secondary | ICD-10-CM | POA: Diagnosis not present

## 2017-03-12 DIAGNOSIS — Z87891 Personal history of nicotine dependence: Secondary | ICD-10-CM

## 2017-03-12 DIAGNOSIS — E119 Type 2 diabetes mellitus without complications: Secondary | ICD-10-CM | POA: Diagnosis present

## 2017-03-12 DIAGNOSIS — Z952 Presence of prosthetic heart valve: Secondary | ICD-10-CM

## 2017-03-12 DIAGNOSIS — F329 Major depressive disorder, single episode, unspecified: Secondary | ICD-10-CM | POA: Diagnosis not present

## 2017-03-12 DIAGNOSIS — Z7982 Long term (current) use of aspirin: Secondary | ICD-10-CM

## 2017-03-12 HISTORY — PX: TEE WITHOUT CARDIOVERSION: SHX5443

## 2017-03-12 HISTORY — PX: MITRAL VALVE REPAIR: SHX2039

## 2017-03-12 HISTORY — PX: AORTIC VALVE REPLACEMENT: SHX41

## 2017-03-12 LAB — POCT I-STAT 3, ART BLOOD GAS (G3+)
Acid-Base Excess: 2 mmol/L (ref 0.0–2.0)
Acid-Base Excess: 3 mmol/L — ABNORMAL HIGH (ref 0.0–2.0)
Acid-Base Excess: 2 mmol/L (ref 0.0–2.0)
Acid-Base Excess: 3 mmol/L — ABNORMAL HIGH (ref 0.0–2.0)
Acid-Base Excess: 1 mmol/L (ref 0.0–2.0)
Acid-Base Excess: 4 mmol/L — ABNORMAL HIGH (ref 0.0–2.0)
Acid-base deficit: 1 mmol/L (ref 0.0–2.0)
Acid-base deficit: 1 mmol/L (ref 0.0–2.0)
Acid-base deficit: 4 mmol/L — ABNORMAL HIGH (ref 0.0–2.0)
Acid-base deficit: 1 mmol/L (ref 0.0–2.0)
Bicarbonate: 27.7 mmol/L (ref 20.0–28.0)
Bicarbonate: 27.9 mmol/L (ref 20.0–28.0)
Bicarbonate: 27.9 mmol/L (ref 20.0–28.0)
Bicarbonate: 29 mmol/L — ABNORMAL HIGH (ref 20.0–28.0)
Bicarbonate: 26 mmol/L (ref 20.0–28.0)
Bicarbonate: 26.8 mmol/L (ref 20.0–28.0)
Bicarbonate: 24.6 mmol/L (ref 20.0–28.0)
Bicarbonate: 23.6 mmol/L (ref 20.0–28.0)
Bicarbonate: 22.2 mmol/L (ref 20.0–28.0)
Bicarbonate: 24.3 mmol/L (ref 20.0–28.0)
O2 Saturation: 100 %
O2 Saturation: 100 %
O2 Saturation: 100 %
O2 Saturation: 100 %
O2 Saturation: 100 %
O2 Saturation: 100 %
O2 Saturation: 100 %
O2 Saturation: 98 %
O2 Saturation: 98 %
O2 Saturation: 96 %
Patient temperature: 41.03
Patient temperature: 37.04
Patient temperature: 37.2
TCO2: 29 mmol/L (ref 0–100)
TCO2: 29 mmol/L (ref 0–100)
TCO2: 29 mmol/L (ref 0–100)
TCO2: 30 mmol/L (ref 0–100)
TCO2: 27 mmol/L (ref 0–100)
TCO2: 28 mmol/L (ref 0–100)
TCO2: 26 mmol/L (ref 0–100)
TCO2: 25 mmol/L (ref 0–100)
TCO2: 24 mmol/L (ref 0–100)
TCO2: 26 mmol/L (ref 0–100)
pCO2 arterial: 43.1 mmHg (ref 32.0–48.0)
pCO2 arterial: 47.7 mmHg (ref 32.0–48.0)
pCO2 arterial: 46.8 mmHg (ref 32.0–48.0)
pCO2 arterial: 48.2 mmHg — ABNORMAL HIGH (ref 32.0–48.0)
pCO2 arterial: 33.4 mmHg (ref 32.0–48.0)
pCO2 arterial: 40.9 mmHg (ref 32.0–48.0)
pCO2 arterial: 43 mmHg (ref 32.0–48.0)
pCO2 arterial: 48.5 mmHg — ABNORMAL HIGH (ref 32.0–48.0)
pCO2 arterial: 46.8 mmHg (ref 32.0–48.0)
pCO2 arterial: 45.8 mmHg (ref 32.0–48.0)
pH, Arterial: 7.372 (ref 7.350–7.450)
pH, Arterial: 7.42 (ref 7.350–7.450)
pH, Arterial: 7.383 (ref 7.350–7.450)
pH, Arterial: 7.387 (ref 7.350–7.450)
pH, Arterial: 7.411 (ref 7.350–7.450)
pH, Arterial: 7.512 — ABNORMAL HIGH (ref 7.350–7.450)
pH, Arterial: 7.365 (ref 7.350–7.450)
pH, Arterial: 7.314 — ABNORMAL LOW (ref 7.350–7.450)
pH, Arterial: 7.284 — ABNORMAL LOW (ref 7.350–7.450)
pH, Arterial: 7.335 — ABNORMAL LOW (ref 7.350–7.450)
pO2, Arterial: 453 mmHg — ABNORMAL HIGH (ref 83.0–108.0)
pO2, Arterial: 467 mmHg — ABNORMAL HIGH (ref 83.0–108.0)
pO2, Arterial: 467 mmHg — ABNORMAL HIGH (ref 83.0–108.0)
pO2, Arterial: 514 mmHg — ABNORMAL HIGH (ref 83.0–108.0)
pO2, Arterial: 279 mmHg — ABNORMAL HIGH (ref 83.0–108.0)
pO2, Arterial: 319 mmHg — ABNORMAL HIGH (ref 83.0–108.0)
pO2, Arterial: 288 mmHg — ABNORMAL HIGH (ref 83.0–108.0)
pO2, Arterial: 131 mmHg — ABNORMAL HIGH (ref 83.0–108.0)
pO2, Arterial: 113 mmHg — ABNORMAL HIGH (ref 83.0–108.0)
pO2, Arterial: 89 mmHg (ref 83.0–108.0)

## 2017-03-12 LAB — PROTIME-INR
INR: 1.53
Prothrombin Time: 18.6 seconds — ABNORMAL HIGH (ref 11.4–15.2)

## 2017-03-12 LAB — POCT I-STAT, CHEM 8
BUN: 18 mg/dL (ref 6–20)
BUN: 16 mg/dL (ref 6–20)
BUN: 15 mg/dL (ref 6–20)
BUN: 15 mg/dL (ref 6–20)
BUN: 15 mg/dL (ref 6–20)
BUN: 15 mg/dL (ref 6–20)
BUN: 16 mg/dL (ref 6–20)
BUN: 16 mg/dL (ref 6–20)
BUN: 15 mg/dL (ref 6–20)
BUN: 13 mg/dL (ref 6–20)
Calcium, Ion: 1.28 mmol/L (ref 1.15–1.40)
Calcium, Ion: 1.23 mmol/L (ref 1.15–1.40)
Calcium, Ion: 1.03 mmol/L — ABNORMAL LOW (ref 1.15–1.40)
Calcium, Ion: 1.13 mmol/L — ABNORMAL LOW (ref 1.15–1.40)
Calcium, Ion: 1.15 mmol/L (ref 1.15–1.40)
Calcium, Ion: 1.13 mmol/L — ABNORMAL LOW (ref 1.15–1.40)
Calcium, Ion: 1.15 mmol/L (ref 1.15–1.40)
Calcium, Ion: 1.17 mmol/L (ref 1.15–1.40)
Calcium, Ion: 1.11 mmol/L — ABNORMAL LOW (ref 1.15–1.40)
Calcium, Ion: 1.2 mmol/L (ref 1.15–1.40)
Chloride: 107 mmol/L (ref 101–111)
Chloride: 107 mmol/L (ref 101–111)
Chloride: 104 mmol/L (ref 101–111)
Chloride: 105 mmol/L (ref 101–111)
Chloride: 105 mmol/L (ref 101–111)
Chloride: 106 mmol/L (ref 101–111)
Chloride: 106 mmol/L (ref 101–111)
Chloride: 107 mmol/L (ref 101–111)
Chloride: 106 mmol/L (ref 101–111)
Chloride: 107 mmol/L (ref 101–111)
Creatinine, Ser: 0.5 mg/dL (ref 0.44–1.00)
Creatinine, Ser: 0.4 mg/dL — ABNORMAL LOW (ref 0.44–1.00)
Creatinine, Ser: 0.3 mg/dL — ABNORMAL LOW (ref 0.44–1.00)
Creatinine, Ser: 0.3 mg/dL — ABNORMAL LOW (ref 0.44–1.00)
Creatinine, Ser: 0.4 mg/dL — ABNORMAL LOW (ref 0.44–1.00)
Creatinine, Ser: 0.4 mg/dL — ABNORMAL LOW (ref 0.44–1.00)
Creatinine, Ser: 0.4 mg/dL — ABNORMAL LOW (ref 0.44–1.00)
Creatinine, Ser: 0.5 mg/dL (ref 0.44–1.00)
Creatinine, Ser: 0.6 mg/dL (ref 0.44–1.00)
Creatinine, Ser: 0.6 mg/dL (ref 0.44–1.00)
Glucose, Bld: 104 mg/dL — ABNORMAL HIGH (ref 65–99)
Glucose, Bld: 94 mg/dL (ref 65–99)
Glucose, Bld: 104 mg/dL — ABNORMAL HIGH (ref 65–99)
Glucose, Bld: 118 mg/dL — ABNORMAL HIGH (ref 65–99)
Glucose, Bld: 80 mg/dL (ref 65–99)
Glucose, Bld: 113 mg/dL — ABNORMAL HIGH (ref 65–99)
Glucose, Bld: 123 mg/dL — ABNORMAL HIGH (ref 65–99)
Glucose, Bld: 138 mg/dL — ABNORMAL HIGH (ref 65–99)
Glucose, Bld: 106 mg/dL — ABNORMAL HIGH (ref 65–99)
Glucose, Bld: 185 mg/dL — ABNORMAL HIGH (ref 65–99)
HCT: 35 % — ABNORMAL LOW (ref 36.0–46.0)
HCT: 33 % — ABNORMAL LOW (ref 36.0–46.0)
HCT: 27 % — ABNORMAL LOW (ref 36.0–46.0)
HCT: 27 % — ABNORMAL LOW (ref 36.0–46.0)
HCT: 27 % — ABNORMAL LOW (ref 36.0–46.0)
HCT: 27 % — ABNORMAL LOW (ref 36.0–46.0)
HCT: 28 % — ABNORMAL LOW (ref 36.0–46.0)
HCT: 28 % — ABNORMAL LOW (ref 36.0–46.0)
HCT: 25 % — ABNORMAL LOW (ref 36.0–46.0)
HCT: 25 % — ABNORMAL LOW (ref 36.0–46.0)
Hemoglobin: 11.9 g/dL — ABNORMAL LOW (ref 12.0–15.0)
Hemoglobin: 11.2 g/dL — ABNORMAL LOW (ref 12.0–15.0)
Hemoglobin: 9.2 g/dL — ABNORMAL LOW (ref 12.0–15.0)
Hemoglobin: 9.2 g/dL — ABNORMAL LOW (ref 12.0–15.0)
Hemoglobin: 9.2 g/dL — ABNORMAL LOW (ref 12.0–15.0)
Hemoglobin: 9.2 g/dL — ABNORMAL LOW (ref 12.0–15.0)
Hemoglobin: 9.5 g/dL — ABNORMAL LOW (ref 12.0–15.0)
Hemoglobin: 9.5 g/dL — ABNORMAL LOW (ref 12.0–15.0)
Hemoglobin: 8.5 g/dL — ABNORMAL LOW (ref 12.0–15.0)
Hemoglobin: 8.5 g/dL — ABNORMAL LOW (ref 12.0–15.0)
Potassium: 4.5 mmol/L (ref 3.5–5.1)
Potassium: 4.1 mmol/L (ref 3.5–5.1)
Potassium: 3.9 mmol/L (ref 3.5–5.1)
Potassium: 4.7 mmol/L (ref 3.5–5.1)
Potassium: 5.4 mmol/L — ABNORMAL HIGH (ref 3.5–5.1)
Potassium: 6 mmol/L — ABNORMAL HIGH (ref 3.5–5.1)
Potassium: 6 mmol/L — ABNORMAL HIGH (ref 3.5–5.1)
Potassium: 6.2 mmol/L — ABNORMAL HIGH (ref 3.5–5.1)
Potassium: 5.1 mmol/L (ref 3.5–5.1)
Potassium: 4.6 mmol/L (ref 3.5–5.1)
Sodium: 143 mmol/L (ref 135–145)
Sodium: 142 mmol/L (ref 135–145)
Sodium: 139 mmol/L (ref 135–145)
Sodium: 139 mmol/L (ref 135–145)
Sodium: 143 mmol/L (ref 135–145)
Sodium: 139 mmol/L (ref 135–145)
Sodium: 139 mmol/L (ref 135–145)
Sodium: 140 mmol/L (ref 135–145)
Sodium: 141 mmol/L (ref 135–145)
Sodium: 142 mmol/L (ref 135–145)
TCO2: 25 mmol/L (ref 0–100)
TCO2: 25 mmol/L (ref 0–100)
TCO2: 28 mmol/L (ref 0–100)
TCO2: 28 mmol/L (ref 0–100)
TCO2: 29 mmol/L (ref 0–100)
TCO2: 26 mmol/L (ref 0–100)
TCO2: 26 mmol/L (ref 0–100)
TCO2: 28 mmol/L (ref 0–100)
TCO2: 24 mmol/L (ref 0–100)
TCO2: 25 mmol/L (ref 0–100)

## 2017-03-12 LAB — GLUCOSE, CAPILLARY
Glucose-Capillary: 91 mg/dL (ref 65–99)
Glucose-Capillary: 100 mg/dL — ABNORMAL HIGH (ref 65–99)
Glucose-Capillary: 157 mg/dL — ABNORMAL HIGH (ref 65–99)
Glucose-Capillary: 119 mg/dL — ABNORMAL HIGH (ref 65–99)

## 2017-03-12 LAB — CBC
HCT: 27.1 % — ABNORMAL LOW (ref 36.0–46.0)
HCT: 25.1 % — ABNORMAL LOW (ref 36.0–46.0)
Hemoglobin: 8.7 g/dL — ABNORMAL LOW (ref 12.0–15.0)
Hemoglobin: 8 g/dL — ABNORMAL LOW (ref 12.0–15.0)
MCH: 26.8 pg (ref 26.0–34.0)
MCH: 26.8 pg (ref 26.0–34.0)
MCHC: 32.1 g/dL (ref 30.0–36.0)
MCHC: 31.9 g/dL (ref 30.0–36.0)
MCV: 83.4 fL (ref 78.0–100.0)
MCV: 83.9 fL (ref 78.0–100.0)
Platelets: 99 10*3/uL — ABNORMAL LOW (ref 150–400)
Platelets: 105 10*3/uL — ABNORMAL LOW (ref 150–400)
RBC: 3.25 MIL/uL — ABNORMAL LOW (ref 3.87–5.11)
RBC: 2.99 MIL/uL — ABNORMAL LOW (ref 3.87–5.11)
RDW: 15.4 % (ref 11.5–15.5)
RDW: 15.6 % — ABNORMAL HIGH (ref 11.5–15.5)
WBC: 5.5 10*3/uL (ref 4.0–10.5)
WBC: 5.4 10*3/uL (ref 4.0–10.5)

## 2017-03-12 LAB — CREATININE, SERUM
Creatinine, Ser: 0.68 mg/dL (ref 0.44–1.00)
GFR calc Af Amer: >60 mL/min (ref >60)
GFR calc non Af Amer: >60 mL/min (ref >60)

## 2017-03-12 LAB — BLOOD GAS, ARTERIAL
Acid-base deficit: 1.1 mmol/L (ref 0.0–2.0)
Bicarbonate: 24.1 mmol/L (ref 20.0–28.0)
Drawn by: 449841
FIO2: 21
O2 Saturation: 96.8 %
Patient temperature: 98.6
pCO2 arterial: 47.6 mmHg (ref 32.0–48.0)
pH, Arterial: 7.324 — ABNORMAL LOW (ref 7.350–7.450)
pO2, Arterial: 111 mmHg — ABNORMAL HIGH (ref 83.0–108.0)

## 2017-03-12 LAB — HEMOGLOBIN AND HEMATOCRIT, BLOOD
HCT: 28.2 % — ABNORMAL LOW (ref 36.0–46.0)
Hemoglobin: 9 g/dL — ABNORMAL LOW (ref 12.0–15.0)

## 2017-03-12 LAB — POCT I-STAT 4, (NA,K, GLUC, HGB,HCT)
Glucose, Bld: 106 mg/dL — ABNORMAL HIGH (ref 65–99)
HCT: 25 % — ABNORMAL LOW (ref 36.0–46.0)
Hemoglobin: 8.5 g/dL — ABNORMAL LOW (ref 12.0–15.0)
Potassium: 3.9 mmol/L (ref 3.5–5.1)
Sodium: 142 mmol/L (ref 135–145)

## 2017-03-12 LAB — APTT: aPTT: 43 seconds — ABNORMAL HIGH (ref 24–36)

## 2017-03-12 LAB — MAGNESIUM: Magnesium: 2.9 mg/dL — ABNORMAL HIGH (ref 1.7–2.4)

## 2017-03-12 LAB — PLATELET COUNT: Platelets: 113 10*3/uL — ABNORMAL LOW (ref 150–400)

## 2017-03-12 SURGERY — REPLACEMENT, AORTIC VALVE, OPEN
Anesthesia: General | Site: Chest

## 2017-03-12 MED ORDER — PANTOPRAZOLE SODIUM 40 MG PO TBEC
40.0000 mg | DELAYED_RELEASE_TABLET | Freq: Every day | ORAL | Status: DC
  Administered 2017-03-14 – 2017-03-17 (×4): 40 mg via ORAL
  Filled 2017-03-12 (×4): qty 1

## 2017-03-12 MED ORDER — SODIUM BICARBONATE 8.4 % IV SOLN
50.0000 meq | Freq: Once | INTRAVENOUS | Status: AC
  Administered 2017-03-12: 50 meq via INTRAVENOUS

## 2017-03-12 MED ORDER — ROCURONIUM BROMIDE 10 MG/ML (PF) SYRINGE
PREFILLED_SYRINGE | INTRAVENOUS | Status: AC
  Filled 2017-03-12: qty 10

## 2017-03-12 MED ORDER — ONDANSETRON HCL 4 MG/2ML IJ SOLN
INTRAMUSCULAR | Status: DC | PRN
  Administered 2017-03-12: 4 mg via INTRAVENOUS

## 2017-03-12 MED ORDER — PROTAMINE SULFATE 10 MG/ML IV SOLN
INTRAVENOUS | Status: AC
  Filled 2017-03-12: qty 25

## 2017-03-12 MED ORDER — SODIUM CHLORIDE 0.9 % IJ SOLN
INTRAMUSCULAR | Status: AC
  Filled 2017-03-12: qty 20

## 2017-03-12 MED ORDER — HEMOSTATIC AGENTS (NO CHARGE) OPTIME
TOPICAL | Status: DC | PRN
  Administered 2017-03-12: 1 via TOPICAL

## 2017-03-12 MED ORDER — ACETAMINOPHEN 160 MG/5ML PO SOLN: 1000.0000 mg | Freq: Four times a day (QID) | ORAL | Status: DC

## 2017-03-12 MED ORDER — CHLORHEXIDINE GLUCONATE 0.12 % MT SOLN
15.0000 mL | OROMUCOSAL | Status: AC
  Administered 2017-03-12: 15 mL via OROMUCOSAL

## 2017-03-12 MED ORDER — SODIUM CHLORIDE 0.9 % IV SOLN
0.0000 ug/min | INTRAVENOUS | Status: DC
  Filled 2017-03-12: qty 2

## 2017-03-12 MED ORDER — CHLORHEXIDINE GLUCONATE CLOTH 2 % EX PADS
6.0000 | MEDICATED_PAD | Freq: Every day | CUTANEOUS | Status: DC
  Administered 2017-03-13 – 2017-03-16 (×4): 6 via TOPICAL

## 2017-03-12 MED ORDER — POTASSIUM CHLORIDE 10 MEQ/50ML IV SOLN
10.0000 meq | INTRAVENOUS | Status: AC
  Administered 2017-03-12 (×3): 10 meq via INTRAVENOUS

## 2017-03-12 MED ORDER — LACTATED RINGERS IV SOLN: INTRAVENOUS | Status: DC

## 2017-03-12 MED ORDER — CHLORHEXIDINE GLUCONATE 0.12% ORAL RINSE (MEDLINE KIT): 15.0000 mL | Freq: Two times a day (BID) | OROMUCOSAL | Status: DC

## 2017-03-12 MED ORDER — INSULIN REGULAR BOLUS VIA INFUSION
0.0000 [IU] | Freq: Three times a day (TID) | INTRAVENOUS | Status: DC
  Filled 2017-03-12: qty 10

## 2017-03-12 MED ORDER — ORAL CARE MOUTH RINSE
15.0000 mL | Freq: Four times a day (QID) | OROMUCOSAL | Status: DC
  Administered 2017-03-12: 15 mL via OROMUCOSAL

## 2017-03-12 MED ORDER — MIDAZOLAM HCL 10 MG/2ML IJ SOLN
INTRAMUSCULAR | Status: AC
Start: 2017-03-12 — End: 2017-03-12
  Filled 2017-03-12: qty 2

## 2017-03-12 MED ORDER — THROMBIN 20000 UNITS EX SOLR
CUTANEOUS | Status: DC | PRN
  Administered 2017-03-12: 20000 [IU] via TOPICAL

## 2017-03-12 MED ORDER — METOPROLOL TARTRATE 25 MG/10 ML ORAL SUSPENSION: 12.5000 mg | Freq: Two times a day (BID) | ORAL | Status: DC

## 2017-03-12 MED ORDER — PROPOFOL 10 MG/ML IV BOLUS
INTRAVENOUS | Status: AC
  Filled 2017-03-12: qty 20

## 2017-03-12 MED ORDER — BISACODYL 5 MG PO TBEC
10.0000 mg | DELAYED_RELEASE_TABLET | Freq: Every day | ORAL | Status: DC
  Administered 2017-03-13 – 2017-03-17 (×5): 10 mg via ORAL
  Filled 2017-03-12 (×5): qty 2

## 2017-03-12 MED ORDER — PROTAMINE SULFATE 10 MG/ML IV SOLN
INTRAVENOUS | Status: DC | PRN
Start: 2017-03-12 — End: 2017-03-12
  Administered 2017-03-12: 20 mg via INTRAVENOUS
  Administered 2017-03-12: 220 mg via INTRAVENOUS

## 2017-03-12 MED ORDER — SODIUM CHLORIDE 0.9% FLUSH
3.0000 mL | Freq: Two times a day (BID) | INTRAVENOUS | Status: DC
  Administered 2017-03-13 – 2017-03-15 (×6): 3 mL via INTRAVENOUS
  Administered 2017-03-16: 10 mL via INTRAVENOUS
  Administered 2017-03-16 – 2017-03-17 (×2): 3 mL via INTRAVENOUS

## 2017-03-12 MED ORDER — ACETAMINOPHEN 160 MG/5ML PO SOLN: 650.0000 mg | Freq: Once | ORAL | Status: AC

## 2017-03-12 MED ORDER — SODIUM CHLORIDE 0.9% FLUSH
10.0000 mL | INTRAVENOUS | Status: DC | PRN
  Administered 2017-03-15: 10 mL
  Filled 2017-03-12: qty 40

## 2017-03-12 MED ORDER — HEPARIN SODIUM (PORCINE) 1000 UNIT/ML IJ SOLN
INTRAMUSCULAR | Status: DC | PRN
  Administered 2017-03-12: 24000 [IU] via INTRAVENOUS

## 2017-03-12 MED ORDER — METOPROLOL TARTRATE 12.5 MG HALF TABLET: 12.5000 mg | ORAL_TABLET | Freq: Two times a day (BID) | ORAL | Status: DC

## 2017-03-12 MED ORDER — VANCOMYCIN HCL IN DEXTROSE 1-5 GM/200ML-% IV SOLN
1000.0000 mg | Freq: Once | INTRAVENOUS | Status: AC
  Administered 2017-03-12: 1000 mg via INTRAVENOUS
  Filled 2017-03-12: qty 200

## 2017-03-12 MED ORDER — FENTANYL CITRATE (PF) 250 MCG/5ML IJ SOLN
INTRAMUSCULAR | Status: AC
  Filled 2017-03-12: qty 30

## 2017-03-12 MED ORDER — SODIUM CHLORIDE 0.9% FLUSH
10.0000 mL | Freq: Two times a day (BID) | INTRAVENOUS | Status: DC
  Administered 2017-03-12: 20 mL
  Administered 2017-03-13 – 2017-03-16 (×5): 10 mL

## 2017-03-12 MED ORDER — SODIUM CHLORIDE 0.9 % IV SOLN
INTRAVENOUS | Status: DC
  Filled 2017-03-12: qty 1

## 2017-03-12 MED ORDER — BISACODYL 10 MG RE SUPP: 10.0000 mg | Freq: Every day | RECTAL | Status: DC

## 2017-03-12 MED ORDER — PHENYLEPHRINE 40 MCG/ML (10ML) SYRINGE FOR IV PUSH (FOR BLOOD PRESSURE SUPPORT)
PREFILLED_SYRINGE | INTRAVENOUS | Status: AC
  Filled 2017-03-12: qty 10

## 2017-03-12 MED ORDER — ATORVASTATIN CALCIUM 20 MG PO TABS
20.0000 mg | ORAL_TABLET | Freq: Every day | ORAL | Status: DC
  Administered 2017-03-13 – 2017-03-18 (×6): 20 mg via ORAL
  Filled 2017-03-12 (×6): qty 1

## 2017-03-12 MED ORDER — EPINEPHRINE PF 1 MG/ML IJ SOLN
INTRAVENOUS | Status: DC | PRN
  Administered 2017-03-12: 1 ug/min via INTRAVENOUS

## 2017-03-12 MED ORDER — SODIUM CHLORIDE 0.9 % IV SOLN
INTRAVENOUS | Status: DC | PRN
  Administered 2017-03-12: 12:00:00 via INTRAVENOUS

## 2017-03-12 MED ORDER — SODIUM CHLORIDE 0.9 % IV SOLN
250.0000 mL | INTRAVENOUS | Status: DC
  Administered 2017-03-14: 250 mL via INTRAVENOUS

## 2017-03-12 MED ORDER — NITROGLYCERIN IN D5W 200-5 MCG/ML-% IV SOLN: 0.0000 ug/min | INTRAVENOUS | Status: DC

## 2017-03-12 MED ORDER — LIDOCAINE 2% (20 MG/ML) 5 ML SYRINGE
INTRAMUSCULAR | Status: AC
  Filled 2017-03-12: qty 5

## 2017-03-12 MED ORDER — LACTATED RINGERS IV SOLN
INTRAVENOUS | Status: DC | PRN
  Administered 2017-03-12: 07:00:00 via INTRAVENOUS

## 2017-03-12 MED ORDER — ORAL CARE MOUTH RINSE
15.0000 mL | Freq: Two times a day (BID) | OROMUCOSAL | Status: DC
  Administered 2017-03-12 – 2017-03-18 (×11): 15 mL via OROMUCOSAL

## 2017-03-12 MED ORDER — SODIUM CHLORIDE 0.9 % IV SOLN
Freq: Once | INTRAVENOUS | Status: AC
  Administered 2017-03-12: 15:00:00 via INTRAVENOUS

## 2017-03-12 MED ORDER — SODIUM CHLORIDE 0.9% FLUSH: 3.0000 mL | INTRAVENOUS | Status: DC | PRN

## 2017-03-12 MED ORDER — BUPROPION HCL ER (XL) 150 MG PO TB24
150.0000 mg | ORAL_TABLET | Freq: Every day | ORAL | Status: DC
  Administered 2017-03-13 – 2017-03-19 (×7): 150 mg via ORAL
  Filled 2017-03-12 (×7): qty 1

## 2017-03-12 MED ORDER — THROMBIN 20000 UNITS EX SOLR
OROMUCOSAL | Status: DC | PRN
  Administered 2017-03-12 (×3): via TOPICAL

## 2017-03-12 MED ORDER — ROCURONIUM BROMIDE 10 MG/ML (PF) SYRINGE
PREFILLED_SYRINGE | INTRAVENOUS | Status: AC
  Filled 2017-03-12: qty 5

## 2017-03-12 MED ORDER — FENTANYL CITRATE (PF) 250 MCG/5ML IJ SOLN
INTRAMUSCULAR | Status: DC | PRN
  Administered 2017-03-12: 100 ug via INTRAVENOUS
  Administered 2017-03-12: 200 ug via INTRAVENOUS
  Administered 2017-03-12 (×3): 250 ug via INTRAVENOUS
  Administered 2017-03-12: 100 ug via INTRAVENOUS
  Administered 2017-03-12: 50 ug via INTRAVENOUS
  Administered 2017-03-12: 100 ug via INTRAVENOUS
  Administered 2017-03-12: 250 ug via INTRAVENOUS
  Administered 2017-03-12: 50 ug via INTRAVENOUS
  Administered 2017-03-12: 100 ug via INTRAVENOUS

## 2017-03-12 MED ORDER — ALBUMIN HUMAN 5 % IV SOLN
250.0000 mL | INTRAVENOUS | Status: AC | PRN
  Administered 2017-03-12 (×2): 250 mL via INTRAVENOUS

## 2017-03-12 MED ORDER — MIRTAZAPINE 15 MG PO TABS
7.5000 mg | ORAL_TABLET | Freq: Every evening | ORAL | Status: DC | PRN
  Administered 2017-03-13 – 2017-03-18 (×5): 7.5 mg via ORAL
  Filled 2017-03-12 (×7): qty 1

## 2017-03-12 MED ORDER — ACETAMINOPHEN 500 MG PO TABS
1000.0000 mg | ORAL_TABLET | Freq: Four times a day (QID) | ORAL | Status: DC
  Administered 2017-03-12 – 2017-03-17 (×19): 1000 mg via ORAL
  Filled 2017-03-12 (×19): qty 2

## 2017-03-12 MED ORDER — THROMBIN 20000 UNITS EX SOLR
CUTANEOUS | Status: AC
  Filled 2017-03-12: qty 20000

## 2017-03-12 MED ORDER — SODIUM CHLORIDE 0.9 % IV SOLN
INTRAVENOUS | Status: DC
  Administered 2017-03-12: 14:00:00 via INTRAVENOUS

## 2017-03-12 MED ORDER — MIDAZOLAM HCL 2 MG/2ML IJ SOLN: 2.0000 mg | INTRAMUSCULAR | Status: DC | PRN

## 2017-03-12 MED ORDER — 0.9 % SODIUM CHLORIDE (POUR BTL) OPTIME
TOPICAL | Status: DC | PRN
  Administered 2017-03-12: 6000 mL

## 2017-03-12 MED ORDER — MAGNESIUM SULFATE 4 GM/100ML IV SOLN
4.0000 g | Freq: Once | INTRAVENOUS | Status: AC
  Administered 2017-03-12: 4 g via INTRAVENOUS
  Filled 2017-03-12: qty 100

## 2017-03-12 MED ORDER — DEXTROSE 5 % IV SOLN
1.5000 g | Freq: Two times a day (BID) | INTRAVENOUS | Status: AC
  Administered 2017-03-12 – 2017-03-14 (×4): 1.5 g via INTRAVENOUS
  Filled 2017-03-12 (×4): qty 1.5

## 2017-03-12 MED ORDER — ETOMIDATE 2 MG/ML IV SOLN
INTRAVENOUS | Status: DC | PRN
  Administered 2017-03-12: 6 mg via INTRAVENOUS

## 2017-03-12 MED ORDER — ALBUMIN HUMAN 5 % IV SOLN
INTRAVENOUS | Status: DC | PRN
  Administered 2017-03-12: 12:00:00 via INTRAVENOUS

## 2017-03-12 MED ORDER — MIDAZOLAM HCL 5 MG/5ML IJ SOLN
INTRAMUSCULAR | Status: DC | PRN
  Administered 2017-03-12: 2 mg via INTRAVENOUS
  Administered 2017-03-12: 3 mg via INTRAVENOUS
  Administered 2017-03-12: 5 mg via INTRAVENOUS

## 2017-03-12 MED ORDER — DOCUSATE SODIUM 100 MG PO CAPS
200.0000 mg | ORAL_CAPSULE | Freq: Every day | ORAL | Status: DC
  Administered 2017-03-13 – 2017-03-17 (×5): 200 mg via ORAL
  Filled 2017-03-12 (×5): qty 2

## 2017-03-12 MED ORDER — ONDANSETRON HCL 4 MG/2ML IJ SOLN
4.0000 mg | Freq: Four times a day (QID) | INTRAMUSCULAR | Status: DC | PRN
  Administered 2017-03-14 – 2017-03-17 (×2): 4 mg via INTRAVENOUS
  Filled 2017-03-12 (×2): qty 2

## 2017-03-12 MED ORDER — METOPROLOL TARTRATE 5 MG/5ML IV SOLN
2.5000 mg | INTRAVENOUS | Status: DC | PRN
  Administered 2017-03-12: 2.5 mg via INTRAVENOUS
  Administered 2017-03-13: 5 mg via INTRAVENOUS
  Filled 2017-03-12: qty 5

## 2017-03-12 MED ORDER — FERROUS FUMARATE 324 (106 FE) MG PO TABS
1.0000 | ORAL_TABLET | Freq: Two times a day (BID) | ORAL | Status: DC
  Administered 2017-03-13 – 2017-03-19 (×13): 106 mg via ORAL
  Filled 2017-03-12 (×14): qty 1

## 2017-03-12 MED ORDER — SODIUM CHLORIDE 0.45 % IV SOLN
INTRAVENOUS | Status: DC | PRN
  Administered 2017-03-12: 14:00:00 via INTRAVENOUS

## 2017-03-12 MED ORDER — INSULIN ASPART 100 UNIT/ML ~~LOC~~ SOLN
0.0000 [IU] | SUBCUTANEOUS | Status: DC
  Administered 2017-03-12: 2 [IU] via SUBCUTANEOUS

## 2017-03-12 MED ORDER — ACETAMINOPHEN 650 MG RE SUPP
650.0000 mg | Freq: Once | RECTAL | Status: AC
  Administered 2017-03-12: 650 mg via RECTAL

## 2017-03-12 MED ORDER — CHLORHEXIDINE GLUCONATE 4 % EX LIQD: 30.0000 mL | CUTANEOUS | Status: DC

## 2017-03-12 MED ORDER — TRAZODONE HCL 150 MG PO TABS
150.0000 mg | ORAL_TABLET | Freq: Every day | ORAL | Status: DC
  Administered 2017-03-13 – 2017-03-18 (×6): 150 mg via ORAL
  Filled 2017-03-12 (×6): qty 1

## 2017-03-12 MED ORDER — DEXTROSE 5 % IV SOLN
0.5000 ug/min | INTRAVENOUS | Status: DC
  Administered 2017-03-12: 1 ug/min via INTRAVENOUS
  Filled 2017-03-12: qty 4

## 2017-03-12 MED ORDER — HEPARIN SODIUM (PORCINE) 1000 UNIT/ML IJ SOLN
INTRAMUSCULAR | Status: AC
  Filled 2017-03-12: qty 1

## 2017-03-12 MED ORDER — EPHEDRINE 5 MG/ML INJ
INTRAVENOUS | Status: AC
  Filled 2017-03-12: qty 10

## 2017-03-12 MED ORDER — ASPIRIN EC 325 MG PO TBEC
325.0000 mg | DELAYED_RELEASE_TABLET | Freq: Every day | ORAL | Status: DC
  Administered 2017-03-13 – 2017-03-14 (×2): 325 mg via ORAL
  Filled 2017-03-12 (×2): qty 1

## 2017-03-12 MED ORDER — MORPHINE SULFATE (PF) 4 MG/ML IV SOLN
2.0000 mg | INTRAVENOUS | Status: DC | PRN
  Administered 2017-03-12 – 2017-03-14 (×8): 4 mg via INTRAVENOUS
  Filled 2017-03-12 (×8): qty 1

## 2017-03-12 MED ORDER — FENTANYL CITRATE (PF) 250 MCG/5ML IJ SOLN
INTRAMUSCULAR | Status: AC
  Filled 2017-03-12: qty 5

## 2017-03-12 MED ORDER — LACTATED RINGERS IV SOLN
INTRAVENOUS | Status: DC | PRN
  Administered 2017-03-12 (×2): via INTRAVENOUS

## 2017-03-12 MED ORDER — MILRINONE LACTATE IN DEXTROSE 20-5 MG/100ML-% IV SOLN
0.2000 ug/kg/min | INTRAVENOUS | Status: DC
  Administered 2017-03-12 – 2017-03-13 (×3): 0.3 ug/kg/min via INTRAVENOUS
  Administered 2017-03-14: 0.2 ug/kg/min via INTRAVENOUS
  Filled 2017-03-12 (×3): qty 100

## 2017-03-12 MED ORDER — MILRINONE LACTATE IN DEXTROSE 20-5 MG/100ML-% IV SOLN
0.1250 ug/kg/min | INTRAVENOUS | Status: AC
  Administered 2017-03-12: .3 ug/kg/min via INTRAVENOUS
  Filled 2017-03-12: qty 100

## 2017-03-12 MED ORDER — TRAMADOL HCL 50 MG PO TABS
50.0000 mg | ORAL_TABLET | ORAL | Status: DC | PRN
  Administered 2017-03-13 – 2017-03-16 (×4): 100 mg via ORAL
  Filled 2017-03-12 (×4): qty 2

## 2017-03-12 MED ORDER — LACTATED RINGERS IV SOLN: 500.0000 mL | Freq: Once | INTRAVENOUS | Status: DC | PRN

## 2017-03-12 MED ORDER — SUCCINYLCHOLINE CHLORIDE 200 MG/10ML IV SOSY
PREFILLED_SYRINGE | INTRAVENOUS | Status: AC
Start: 2017-03-12 — End: 2017-03-12
  Filled 2017-03-12: qty 10

## 2017-03-12 MED ORDER — SODIUM CHLORIDE 0.9 % IV SOLN
0.0000 ug/kg/h | INTRAVENOUS | Status: DC
  Administered 2017-03-12: 0.2 ug/kg/h via INTRAVENOUS
  Filled 2017-03-12 (×2): qty 2

## 2017-03-12 MED ORDER — ASPIRIN 81 MG PO CHEW: 324.0000 mg | CHEWABLE_TABLET | Freq: Every day | ORAL | Status: DC

## 2017-03-12 MED ORDER — MORPHINE SULFATE (PF) 4 MG/ML IV SOLN: 1.0000 mg | INTRAVENOUS | Status: AC | PRN

## 2017-03-12 MED ORDER — LEVOTHYROXINE SODIUM 112 MCG PO TABS
112.0000 ug | ORAL_TABLET | Freq: Every day | ORAL | Status: DC
  Administered 2017-03-13 – 2017-03-19 (×7): 112 ug via ORAL
  Filled 2017-03-12 (×7): qty 1

## 2017-03-12 MED ORDER — FAMOTIDINE IN NACL 20-0.9 MG/50ML-% IV SOLN
20.0000 mg | Freq: Two times a day (BID) | INTRAVENOUS | Status: AC
  Administered 2017-03-12: 20 mg via INTRAVENOUS

## 2017-03-12 MED ORDER — DEXAMETHASONE SODIUM PHOSPHATE 10 MG/ML IJ SOLN
INTRAMUSCULAR | Status: DC | PRN
  Administered 2017-03-12: 10 mg via INTRAVENOUS

## 2017-03-12 MED ORDER — ROCURONIUM BROMIDE 10 MG/ML (PF) SYRINGE
PREFILLED_SYRINGE | INTRAVENOUS | Status: DC | PRN
  Administered 2017-03-12: 20 mg via INTRAVENOUS
  Administered 2017-03-12: 100 mg via INTRAVENOUS
  Administered 2017-03-12: 50 mg via INTRAVENOUS

## 2017-03-12 SURGICAL SUPPLY — 92 items
ADAPTER CARDIO PERF ANTE/RETRO (ADAPTER) ×3 IMPLANT
BAG DECANTER FOR FLEXI CONT (MISCELLANEOUS) ×3 IMPLANT
BLADE STERNUM SYSTEM 6 (BLADE) ×3 IMPLANT
BLADE SURG 15 STRL LF DISP TIS (BLADE) ×4 IMPLANT
BLADE SURG 15 STRL SS (BLADE) ×6
CANISTER SUCT 3000ML PPV (MISCELLANEOUS) ×3 IMPLANT
CANN PRFSN 3/8X14X24FR PCFC (MISCELLANEOUS) ×2
CANNULA GUNDRY RCSP 15FR (MISCELLANEOUS) ×3 IMPLANT
CANNULA PRFSN 3/8X14X24FR PCFC (MISCELLANEOUS) ×2 IMPLANT
CANNULA VEN MTL TIP RT (MISCELLANEOUS) ×3
CANNULA VRC MALB SNGL STG 34FR (MISCELLANEOUS) ×2 IMPLANT
CATH ROBINSON RED A/P 18FR (CATHETERS) ×12 IMPLANT
CATH THORACIC 28FR (CATHETERS) ×3 IMPLANT
CATH THORACIC 36FR (CATHETERS) ×3 IMPLANT
CATH THORACIC 36FR RT ANG (CATHETERS) ×3 IMPLANT
CONN 1/2X1/2X1/2  BEN (MISCELLANEOUS) ×3
CONN 1/2X1/2X1/2 BEN (MISCELLANEOUS) ×2 IMPLANT
CONN 3/8X1/2 ST GISH (MISCELLANEOUS) ×6 IMPLANT
CONT SPEC 4OZ CLIKSEAL STRL BL (MISCELLANEOUS) ×6 IMPLANT
COVER MAYO STAND STRL (DRAPES) ×3 IMPLANT
COVER SURGICAL LIGHT HANDLE (MISCELLANEOUS) ×6 IMPLANT
CRADLE DONUT ADULT HEAD (MISCELLANEOUS) ×3 IMPLANT
DRAPE CARDIOVASCULAR INCISE (DRAPES) ×3
DRAPE SLUSH/WARMER DISC (DRAPES) ×3 IMPLANT
DRAPE SRG 135X102X78XABS (DRAPES) ×2 IMPLANT
DRSG COVADERM 4X14 (GAUZE/BANDAGES/DRESSINGS) ×3 IMPLANT
ELECT CAUTERY BLADE 6.4 (BLADE) ×3 IMPLANT
ELECT REM PT RETURN 9FT ADLT (ELECTROSURGICAL) ×6
ELECTRODE REM PT RTRN 9FT ADLT (ELECTROSURGICAL) ×4 IMPLANT
FELT TEFLON 1X6 (MISCELLANEOUS) ×3 IMPLANT
GAUZE SPONGE 4X4 12PLY STRL (GAUZE/BANDAGES/DRESSINGS) ×6 IMPLANT
GAUZE SPONGE 4X4 12PLY STRL LF (GAUZE/BANDAGES/DRESSINGS) ×3 IMPLANT
GLOVE BIO SURGEON STRL SZ 6 (GLOVE) ×12 IMPLANT
GLOVE BIO SURGEON STRL SZ 6.5 (GLOVE) IMPLANT
GLOVE BIO SURGEON STRL SZ7 (GLOVE) ×24 IMPLANT
GLOVE BIO SURGEON STRL SZ7.5 (GLOVE) ×3 IMPLANT
GLOVE EUDERMIC 7 POWDERFREE (GLOVE) ×6 IMPLANT
GOWN STRL REUS W/ TWL LRG LVL3 (GOWN DISPOSABLE) ×16 IMPLANT
GOWN STRL REUS W/ TWL XL LVL3 (GOWN DISPOSABLE) ×4 IMPLANT
GOWN STRL REUS W/TWL LRG LVL3 (GOWN DISPOSABLE) ×24
GOWN STRL REUS W/TWL XL LVL3 (GOWN DISPOSABLE) ×6
HEART VENT LT CURVED (MISCELLANEOUS) ×3 IMPLANT
HEMOSTAT POWDER SURGIFOAM 1G (HEMOSTASIS) ×9 IMPLANT
HEMOSTAT SURGICEL 2X14 (HEMOSTASIS) ×3 IMPLANT
KIT BASIN OR (CUSTOM PROCEDURE TRAY) ×3 IMPLANT
KIT CATH CPB BARTLE (MISCELLANEOUS) ×3 IMPLANT
KIT ROOM TURNOVER OR (KITS) ×3 IMPLANT
KIT SUCTION CATH 14FR (SUCTIONS) ×3 IMPLANT
LINE VENT (MISCELLANEOUS) ×3 IMPLANT
NS IRRIG 1000ML POUR BTL (IV SOLUTION) ×21 IMPLANT
PACK OPEN HEART (CUSTOM PROCEDURE TRAY) ×3 IMPLANT
PAD ARMBOARD 7.5X6 YLW CONV (MISCELLANEOUS) ×6 IMPLANT
RING MITRAL MEMO 3D 28MM SMD28 (Prosthesis & Implant Heart) ×3 IMPLANT
SUCKER WEIGHTED FLEX (MISCELLANEOUS) ×3 IMPLANT
SUT BONE WAX W31G (SUTURE) ×3 IMPLANT
SUT ETHIBON 2 0 V 52N 30 (SUTURE) ×6 IMPLANT
SUT ETHIBON EXCEL 2-0 V-5 (SUTURE) ×3 IMPLANT
SUT ETHIBOND 2 0 SH (SUTURE) ×27 IMPLANT
SUT ETHIBOND 2 0 SH 36X2 (SUTURE) ×16 IMPLANT
SUT ETHIBOND 2 0 V4 (SUTURE) ×9 IMPLANT
SUT ETHIBOND 2 0V4 GREEN (SUTURE) ×9 IMPLANT
SUT ETHIBOND V-5 VALVE (SUTURE) ×3 IMPLANT
SUT PROLENE 3 0 SH DA (SUTURE) ×6 IMPLANT
SUT PROLENE 3 0 SH1 36 (SUTURE) ×3 IMPLANT
SUT PROLENE 4 0 RB 1 (SUTURE) ×21
SUT PROLENE 4-0 RB1 .5 CRCL 36 (SUTURE) ×14 IMPLANT
SUT PROLENE 5 0 C 1 36 (SUTURE) ×6 IMPLANT
SUT PROLENE 6 0 C 1 30 (SUTURE) ×6 IMPLANT
SUT PROLENE 8 0 BV175 6 (SUTURE) ×6 IMPLANT
SUT SILK  1 MH (SUTURE) ×9
SUT SILK 1 MH (SUTURE) ×6 IMPLANT
SUT SILK 1 TIES 10X30 (SUTURE) ×3 IMPLANT
SUT SILK 2 0 SH CR/8 (SUTURE) ×9 IMPLANT
SUT SILK 2 0 TIES 10X30 (SUTURE) ×3 IMPLANT
SUT SILK 3 0 SH CR/8 (SUTURE) ×3 IMPLANT
SUT SILK 4 0 TIE 10X30 (SUTURE) ×3 IMPLANT
SUT STEEL 6MS V (SUTURE) ×6 IMPLANT
SUT TEM PAC WIRE 2 0 SH (SUTURE) ×12 IMPLANT
SUT VIC AB 1 CTX 36 (SUTURE) ×9
SUT VIC AB 1 CTX36XBRD ANBCTR (SUTURE) ×6 IMPLANT
SUT VIC AB 2-0 CTX 27 (SUTURE) ×6 IMPLANT
SUT VIC AB 3-0 X1 27 (SUTURE) ×6 IMPLANT
SYSTEM SAHARA CHEST DRAIN ATS (WOUND CARE) ×3 IMPLANT
TAPE CLOTH SURG 4X10 WHT LF (GAUZE/BANDAGES/DRESSINGS) ×3 IMPLANT
TAPE PAPER 2X10 WHT MICROPORE (GAUZE/BANDAGES/DRESSINGS) ×3 IMPLANT
TOWEL GREEN STERILE (TOWEL DISPOSABLE) ×3 IMPLANT
TRAY FOLEY SILVER 16FR TEMP (SET/KITS/TRAYS/PACK) ×3 IMPLANT
TUBE CONNECTING 12X1/4 (SUCTIONS) ×3 IMPLANT
UNDERPAD 30X30 (UNDERPADS AND DIAPERS) ×3 IMPLANT
VALVE MAGNA EASE AORTIC 23MM (Prosthesis & Implant Heart) ×3 IMPLANT
VRC MALLEABLE SINGLE STG 34FR (MISCELLANEOUS) ×3
WATER STERILE IRR 1000ML POUR (IV SOLUTION) ×6 IMPLANT

## 2017-03-12 NOTE — Transfer of Care (Signed)
Immediate Anesthesia Transfer of Care Note  Patient: Miranda [name]  Procedure(s) Performed: Procedure(s) with comments: AORTIC VALVE REPLACEMENT (AVR) with Mitral Annuloplasty (N/A) MITRAL VALVE REPAIR (MVR) (N/A) - mitral annuloplasty TRANSESOPHAGEAL ECHOCARDIOGRAM (TEE) (N/A)  Patient Location: SICU  Anesthesia Type:General  Level of Consciousness: Patient remains intubated per anesthesia plan  Airway & Oxygen Therapy: Patient remains intubated per anesthesia plan and Patient placed on Ventilator (see vital sign flow sheet for setting)  Post-op Assessment: Report given to RN and Post -op Vital signs reviewed and stable  Post vital signs: Reviewed and stable  Last Vitals:  Vitals:   03/12/17 0554 03/12/17 1310  BP: (!) 134/59 107/60  Pulse: 70 80  Resp:  12  Temp: 36.8 C   SpO2: 100% 97%    Last Pain:  Vitals:   03/12/17 0554  TempSrc: Oral      Patients Stated Pain Goal: 3 (46/50/35 4656)  Complications: No apparent anesthesia complications

## 2017-03-12 NOTE — Progress Notes (Signed)
  Echocardiogram Echocardiogram Transesophageal has been performed.  Jennette Dubin 03/12/2017, 8:36 AM

## 2017-03-12 NOTE — Op Note (Signed)
CARDIOVASCULAR SURGERY OPERATIVE NOTE  03/12/2017 Miranda [name] [id number]  Surgeon:  Gaye Pollack, MD  First Assistant: Lars Pinks,  PA-C   Preoperative Diagnosis:  Severe aortic insufficiency, moderate mitral regurgitation, severe LV systolic dysfunction.   Postoperative Diagnosis:  Same   Procedure:  1. Median Sternotomy 2. Extracorporeal circulation 3.   Aortic valve replacement using a 23 mm Edwards Magna-Ease pericardial valve. 4.   Mitral valve annuloplasty using a 28 mm Sorin 3D MEMO ring.  Anesthesia:  General Endotracheal   Clinical History/Surgical Indication:  The patient is a 51 year old woman with hypertension, hypothyroidism, depression on meds and a history of non-ischemic cardiomyopathy dating back to 2014 when she presented with congestive heart failure and an echo 10/27/2016 showed an EF of 15-20% with diffuse hypokinesis, mild MR and no AS or AI. She had a cath at that time that showed no significant coronary disease. She was treated medically and a follow up echo in 01/2013 showed some improvement in the LVEF to 35-40%. Her next echo in 10/2013 showed further improvement in the EF to 50% with no AS or AI. There was mild aortic root dilation to 37 mm. She was seen in 04/2016 and was doing well on an ACE I and BB with no diuretic. She presented in April with sudden onset of shortness of breath and swelling in her legs and abdomen. She was started on lasix but her SOB did not improve until her lasix was titrated up for a few days. She had a follow up echo on 5/9 that showed new severe central AI with an drop in her LVEF to 30-35% with diffuse hypokinesis and grade 3 diastolic dysfunction. There was also moderate MR with a mildly restricted posterior leaflet and annular dilation. She was seen by me on 01/01/2017 and underwent dental evaluation and extraction by Dr. Enrique Sack.  She has non-ischemic cardiomyopathy with an EF of 30-35% by echo in May and estimated  by cardiology to be 15% at cath. I have personally reviewed the cath images and I think her LVEF is probably about 30%. She has severe AI and moderate pulmonary hypertension. There is mild non-obstructive CAD with a 50% ostial PDA lesion. The aortic root has a diameter by echo of 36 mm and the ascending aorta is 38 mm which is not large enough to require replacement. Her LV internal dimensions were dilated on echo with a diastolic dimension of 55 mm. I think AVR is indicated in this patient with NYHA class III symptoms of acute on chronic systolic and diastolic CHF. It is difficult to quantify the amount of MR on echo but this will have to be evaluated by TEE. I think a bioprosthetic valve is probably the best option for her. With depression on chronic medications and chronic cannabis use I think a mechanical valve and long term need for Coumadin would be risky. There is a higher risk of structural valve deterioration in her age group with a bioprosthetic valve but I think the risk of that is lower than the risk of long term need for Coumadin in this particular patient. I discussed this with her and tried to get a feel for what she thought about being on Coumadin and she says that she sometimes forgets to take her meds for a few days which I think is an absolute contraindication to a mechanical valve and Coumadin. She is in agreement with a tissue valve. I discussed the operative procedure with the patient  including alternatives,  benefits and risks; including but not limited to bleeding, blood transfusion, infection, stroke, myocardial infarction, graft failure, heart block requiring a permanent pacemaker, organ dysfunction, and death.  Miranda [name] understands and agrees to proceed.    Preparation:  The patient was seen in the preoperative holding area and the correct patient, correct operation were confirmed with the patient after reviewing the medical record and catheterization. The consent was signed by  me. Preoperative antibiotics were given. A pulmonary arterial line and radial arterial line were placed by the anesthesia team. The patient was taken back to the operating room and positioned supine on the operating room table. After being placed under general endotracheal anesthesia by the anesthesia team a foley catheter was placed. The neck, chest, abdomen, and both legs were prepped with betadine soap and solution and draped in the usual sterile manner. A surgical time-out was taken and the correct patient and operative procedure were confirmed with the nursing and anesthesia staff.   Pre-bypass TEE:   Complete TEE assessment was performed by Dr. Lissa Hoard.   Miranda [name]  ECHO TEE (OR) W 3D  Order# [id number]  Reading physician: Nolon Nations, MD Ordering physician: Gaye Pollack, MD Study date: 03/12/17  Conclusions   Result status: Final result   Left ventricle: Cavity is moderately dilated. LV systolic function is severely reduced with an EF of 25-30%. No thrombus present. No mass present.  Septum: No Patent Foramen Ovale present.  Left atrium: Patent foramen ovale not present.  Left atrium: Cavity is dilated and dilated.  Aortic valve: The valve is trileaflet. Mild valve thickening present. No stenosis. Moderate to severe regurgitation. There is flow reversal in the descending aorta.  Mitral valve: Non-specific thickening. Moderate regurgitation.  Right ventricle: Cavity is moderately dilated. Moderately reduced systolic function.  Right atrium: Cavity is dilated.  Tricuspid valve: Mild regurgitation. The tricuspid valve regurgitation jet is directed toward the right atrial free wall.  Pulmonic valve: Trace regurgitation.  Right ventricle: Normal cavity size. Mildly reduced systolic function.       Post-bypass TEE:   Normal functioning prosthetic aortic valve with no perivalvular leak or regurgitation through the valve. Mean gradient of 6 mm Hg. Left  ventricular function slightly improved on inotropes. Trivial  mitral regurgitation with mean gradient of 5 mm Hg.    Cardiopulmonary Bypass:  A median sternotomy was performed. The pericardium was opened in the midline. Right ventricular function appeared normal. The ascending aorta was of normal size and had no palpable plaque. There were no contraindications to aortic cannulation or cross-clamping. The patient was fully systemically heparinized and the ACT was maintained > 400 sec. The proximal aortic arch was cannulated with a 73 F aortic cannula for arterial inflow. Venous cannulation was performed via the right atrial appendage using a two-staged venous cannula. An antegrade cardioplegia/vent cannula was inserted into the mid-ascending aorta. A left ventricular vent was placed via the right superior pulmonary vein. A retrograde cardioplegia cannnula was placed into the coronary sinus via the right atrium. Aortic occlusion was performed with a single cross-clamp. Systemic cooling to 28 degrees Centigrade and topical cooling of the heart with iced saline were used. Hyperkalemic retrograde cold blood cardioplegia was used to induce diastolic arrest and then cold blood retrograde cardioplegia was given at about 20 minute intervals throughout the period of arrest to maintain myocardial temperature at or below 10 degrees centigrade. A temperature probe was inserted into the interventricular septum and an insulating pad was placed in the pericardium.  Carbon dioxide was insufflated into the pericardium at 5L/min throughout the procedure to minimize intracardiac air.  Mitral Valve Repair:   The left atrium was opened through a vertical incision in the interatrial groove. Exposure was good. Valve inspection showed normal leaflets with annular dilation. There was no prolapse and the regurgitation appeared to be related to a loss of coaptation from annular enlargement. A series of pledgetted 2-0 Ethibond sutures  were placed around the mitral annulus. The anterior leaflet was sized and a 28 mm Sorin 3D Memo annuloplasty ring was chosen. The sutures were placed through the ring and it was lowered into place. The sutures were tied. The valve was tested with saline with complete distension of the LV and there was no regurgitation. The atrium was closed with 2 layers of continuous 3-0 prolene suture.     Aortic Valve Replacement:   A transverse aortotomy was performed 1 cm above the take-off of the right coronary artery. The native valve was tricuspid with thickening and retraction of the free edge of all 3 leaflets, worse towards the center of the valve leaflets. There were fenestrations at all 3 commissures. The ostia of the coronary arteries were in normal position and were not obstructed. The native valve leaflets were excised. Care was taken to remove all particulate debris. The left ventricle was directly inspected for debris and then irrigaand ted with ice saline solution. The annulus was sized and a size 23 mm Edwards Magna-Ease pericardial valve was chosen. The model number was [id number] and the serial number was [id number]. While the valve was being prepared 2-0 Ethibond pledgeted horizontal mattress sutures were placed around the annulus with the pledgets in a sub-annular position. The sutures were placed through the sewing ring and the valve lowered into place. The sutures were tied sequentially. The valve seated nicely and the coronary ostia were not obstructed. The prosthetic valve leaflets moved normally and there was no sub-valvular obstruction. The aortotomy was closed using 4-0 Prolene suture in 2 layers with felt strips to reinforce the closure.  Completion:  The patient was rewarmed to 37 degrees Centigrade. De-airing maneuvers were performed and the head placed in trendelenburg position. The crossclamp was removed with a time of 117 minutes. There was spontaneous return of sinus rhythm. The aortotomy  was checked for hemostasis. Two temporary epicardial pacing wires were placed on the right atrium and two on the right ventricle. The left ventricular vent and retrograde cardioplegia cannulas were removed. The patient was weaned from CPB without difficulty on Milrinone 0.3 mcg and epinephrine 2 mcg. CPB time was 138 minutes. Cardiac output was 4.2 LPM. Heparin was fully reversed with protamine and the aortic and venous cannulas removed. Hemostasis was achieved. Mediastinal and right pleural drainage tubes were placed. The sternum was closed with  #6 stainless steel wires. The fascia was closed with continuous # 1 vicryl suture. The subcutaneous tissue was closed with 2-0 vicryl continuous suture. The skin was closed with 3-0 vicryl subcuticular suture. All sponge, needle, and instrument counts were reported correct at the end of the case. Dry sterile dressings were placed over the incisions and around the chest tubes which were connected to pleurevac suction. The patient was then transported to the surgical intensive care unit in critical but stable condition.

## 2017-03-12 NOTE — Progress Notes (Signed)
CT Surgery  Stable after AVR-MV ring Awake on vent  Stable hemodynamics Weaning inprogress

## 2017-03-12 NOTE — Interval H&P Note (Signed)
History and Physical Interval Note:  03/12/2017 6:20 AM  Miranda [name]  has presented today for surgery, with the diagnosis of severe aortic insufficiency, mitral regurgitation  The various methods of treatment have been discussed with the patient and family. After consideration of risks, benefits and other options for treatment, the patient has consented to  Procedure(s) with comments: AORTIC VALVE REPLACEMENT (AVR), possible Mitral Annuloplasty (N/A) MITRAL VALVE REPAIR (MVR) (N/A) - mitral annuloplasty/ possible  TRANSESOPHAGEAL ECHOCARDIOGRAM (TEE) (N/A) as a surgical intervention .  The patient's history has been reviewed, patient examined, no change in status, stable for surgery.  I have reviewed the patient's chart and labs.  Questions were answered to the patient's satisfaction.     Gaye Pollack

## 2017-03-12 NOTE — Brief Op Note (Signed)
03/12/2017  11:27 AM  PATIENT:  Miranda [name]  51 y.o. female  PRE-OPERATIVE DIAGNOSIS:  1. Severe aortic insufficiency 2.Mitral regurgitation  POST-OPERATIVE DIAGNOSIS:  1. Severe aortic insufficiency 2.Mitral regurgitation  PROCEDURE:  TRANSESOPHAGEAL ECHOCARDIOGRAM (TEE), MEDIAN STERNOTOMY for AORTIC VALVE REPLACEMENT (AVR) (using a pericardial tissue valve, size 23, Model [id number], serial [id number]),  MITRAL VALVE REPAIR (MVR)  (using a Memo 3D ring annuloplasty, Model [id number], serial [id number])  SURGEON:  Surgeon(s) and Role:    * Bartle, Fernande Boyden, MD - Primary  PHYSICIAN ASSISTANT: Lars Pinks PA-C  ANESTHESIA:   general  EBL:  Total I/O In: -  Out: 100 [Urine:100]  DRAINS: Chest tubes placed in the mediastinal and pleural spaces   SPECIMEN:  Source of Specimen:  Native AV leaflets  DISPOSITION OF SPECIMEN:  PATHOLOGY  COUNTS CORRECT:  YES  DICTATION: .Dragon Dictation  PLAN OF CARE: Admit to inpatient   PATIENT DISPOSITION:  ICU - intubated and hemodynamically stable.   Delay start of Pharmacological VTE agent (>24hrs) due to surgical blood loss or risk of bleeding: yes  BASELINE WEIGHT: 60.1 kg

## 2017-03-12 NOTE — Anesthesia Procedure Notes (Signed)
Central Venous Catheter Insertion Performed by: Murvin Natal, anesthesiologist Start/End8/04/2017 6:35 AM, 03/12/2017 6:45 AM Patient location: Pre-op. Preanesthetic checklist: patient identified, IV checked, site marked, risks and benefits discussed, surgical consent, monitors and equipment checked, pre-op evaluation, timeout performed and anesthesia consent Position: Trendelenburg Lidocaine 1% used for infiltration and patient sedated Hand hygiene performed , maximum sterile barriers used  and Seldinger technique used Catheter size: 9 Fr Total catheter length 12. PA cath and Central line was placed.MAC introducer Swan type:thermodilution PA Cath depth:47 Procedure performed using ultrasound guided technique. Ultrasound Notes:anatomy identified, needle tip was noted to be adjacent to the nerve/plexus identified, no ultrasound evidence of intravascular and/or intraneural injection and image(s) printed for medical record Attempts: 1 Following insertion, line sutured and dressing applied. Post procedure assessment: blood return through all ports, free fluid flow and no air  Patient tolerated the procedure well with no immediate complications.

## 2017-03-12 NOTE — Anesthesia Procedure Notes (Signed)
Procedure Name: Intubation Date/Time: 03/12/2017 7:42 AM Performed by: Izora Gala Pre-anesthesia Checklist: Patient identified Patient Re-evaluated:Patient Re-evaluated prior to induction Oxygen Delivery Method: Circle system utilized Preoxygenation: Pre-oxygenation with 100% oxygen Induction Type: IV induction Laryngoscope Size: Miller and 3 Grade View: Grade II Tube type: Oral Tube size: 7.5 mm Number of attempts: 1 Airway Equipment and Method: Stylet Placement Confirmation: ETT inserted through vocal cords under direct vision,  positive ETCO2 and breath sounds checked- equal and bilateral Secured at: 22 cm Tube secured with: Tape Dental Injury: Teeth and Oropharynx as per pre-operative assessment

## 2017-03-12 NOTE — Procedures (Signed)
Extubation Procedure Note  Patient Details:   Name: Janda Cargo DOB: [birth year] MRN: [id number]   Airway Documentation:     Evaluation  O2 sats: stable throughout Complications: No apparent complications Patient did tolerate procedure well. Bilateral Breath Sounds: Clear   Yes pt able to vocalize.   Pt extubated per protocol. Pt was able to breathe around deflated cuff. No stridor noted.   Irineo Axon [facility] 03/12/2017, 7:12 PM

## 2017-03-12 NOTE — Anesthesia Procedure Notes (Signed)
Arterial Line Insertion Start/End8/04/2017 6:50 AM, 03/12/2017 7:00 AM Performed by: Izora Gala, CRNA  Lidocaine 1% used for infiltration radial was placed Hand hygiene performed  and maximum sterile barriers used   Attempts: 1 Following insertion, dressing applied and Biopatch. Post procedure assessment: unchanged  Patient tolerated the procedure well with no immediate complications.

## 2017-03-13 ENCOUNTER — Encounter (HOSPITAL_COMMUNITY): Payer: Self-pay | Admitting: Surgery

## 2017-03-13 ENCOUNTER — Inpatient Hospital Stay (HOSPITAL_COMMUNITY): Payer: PPO

## 2017-03-13 LAB — BASIC METABOLIC PANEL
Anion gap: 6 (ref 5–15)
BUN: 11 mg/dL (ref 6–20)
CO2: 23 mmol/L (ref 22–32)
Calcium: 8.3 mg/dL — ABNORMAL LOW (ref 8.9–10.3)
Chloride: 107 mmol/L (ref 101–111)
Creatinine, Ser: 0.65 mg/dL (ref 0.44–1.00)
GFR calc Af Amer: >60 mL/min (ref >60)
GFR calc non Af Amer: >60 mL/min (ref >60)
Glucose, Bld: 124 mg/dL — ABNORMAL HIGH (ref 65–99)
Potassium: 5.2 mmol/L — ABNORMAL HIGH (ref 3.5–5.1)
Sodium: 136 mmol/L (ref 135–145)

## 2017-03-13 LAB — GLUCOSE, CAPILLARY
Glucose-Capillary: 104 mg/dL — ABNORMAL HIGH (ref 65–99)
Glucose-Capillary: 113 mg/dL — ABNORMAL HIGH (ref 65–99)
Glucose-Capillary: 113 mg/dL — ABNORMAL HIGH (ref 65–99)
Glucose-Capillary: 117 mg/dL — ABNORMAL HIGH (ref 65–99)
Glucose-Capillary: 120 mg/dL — ABNORMAL HIGH (ref 65–99)
Glucose-Capillary: 127 mg/dL — ABNORMAL HIGH (ref 65–99)

## 2017-03-13 LAB — CREATININE, SERUM
Creatinine, Ser: 0.66 mg/dL (ref 0.44–1.00)
GFR calc Af Amer: >60 mL/min (ref >60)
GFR calc non Af Amer: >60 mL/min (ref >60)

## 2017-03-13 LAB — CBC
HCT: 25.6 % — ABNORMAL LOW (ref 36.0–46.0)
HCT: 25.1 % — ABNORMAL LOW (ref 36.0–46.0)
Hemoglobin: 8.1 g/dL — ABNORMAL LOW (ref 12.0–15.0)
Hemoglobin: 8 g/dL — ABNORMAL LOW (ref 12.0–15.0)
MCH: 26.6 pg (ref 26.0–34.0)
MCH: 26.9 pg (ref 26.0–34.0)
MCHC: 31.6 g/dL (ref 30.0–36.0)
MCHC: 31.9 g/dL (ref 30.0–36.0)
MCV: 83.9 fL (ref 78.0–100.0)
MCV: 84.5 fL (ref 78.0–100.0)
Platelets: 121 10*3/uL — ABNORMAL LOW (ref 150–400)
Platelets: 110 10*3/uL — ABNORMAL LOW (ref 150–400)
RBC: 3.05 MIL/uL — ABNORMAL LOW (ref 3.87–5.11)
RBC: 2.97 MIL/uL — ABNORMAL LOW (ref 3.87–5.11)
RDW: 15.6 % — ABNORMAL HIGH (ref 11.5–15.5)
RDW: 16.1 % — ABNORMAL HIGH (ref 11.5–15.5)
WBC: 7.1 10*3/uL (ref 4.0–10.5)
WBC: 9.6 10*3/uL (ref 4.0–10.5)

## 2017-03-13 LAB — BPAM PLATELET PHERESIS
Blood Product Expiration Date: 201808092359
ISSUE DATE / TIME: 201808091329
Unit Type and Rh: 600

## 2017-03-13 LAB — POCT I-STAT, CHEM 8
BUN: 15 mg/dL (ref 6–20)
Calcium, Ion: 1.25 mmol/L (ref 1.15–1.40)
Chloride: 96 mmol/L — ABNORMAL LOW (ref 101–111)
Creatinine, Ser: 0.7 mg/dL (ref 0.44–1.00)
Glucose, Bld: 114 mg/dL — ABNORMAL HIGH (ref 65–99)
HCT: 29 % — ABNORMAL LOW (ref 36.0–46.0)
Hemoglobin: 9.9 g/dL — ABNORMAL LOW (ref 12.0–15.0)
Potassium: 4.7 mmol/L (ref 3.5–5.1)
Sodium: 134 mmol/L — ABNORMAL LOW (ref 135–145)
TCO2: 26 mmol/L (ref 0–100)

## 2017-03-13 LAB — COOXEMETRY PANEL
Carboxyhemoglobin: 0.7 % (ref 0.5–1.5)
Methemoglobin: 1.1 % (ref 0.0–1.5)
O2 Saturation: 49.2 %
Total hemoglobin: 8.5 g/dL — ABNORMAL LOW (ref 12.0–16.0)

## 2017-03-13 LAB — PREPARE PLATELET PHERESIS: Unit division: 0

## 2017-03-13 LAB — MAGNESIUM
Magnesium: 2.3 mg/dL (ref 1.7–2.4)
Magnesium: 2 mg/dL (ref 1.7–2.4)

## 2017-03-13 MED ORDER — INSULIN ASPART 100 UNIT/ML ~~LOC~~ SOLN
0.0000 [IU] | SUBCUTANEOUS | Status: DC
  Administered 2017-03-13 (×2): 2 [IU] via SUBCUTANEOUS

## 2017-03-13 MED ORDER — FERROUS GLUCONATE 324 (38 FE) MG PO TABS
324.0000 mg | ORAL_TABLET | Freq: Two times a day (BID) | ORAL | Status: DC
  Administered 2017-03-13 – 2017-03-14 (×4): 324 mg via ORAL
  Filled 2017-03-13 (×5): qty 1

## 2017-03-13 MED ORDER — FUROSEMIDE 10 MG/ML IJ SOLN
40.0000 mg | Freq: Two times a day (BID) | INTRAMUSCULAR | Status: AC
  Administered 2017-03-13 (×2): 40 mg via INTRAVENOUS
  Filled 2017-03-13 (×2): qty 4

## 2017-03-13 MED FILL — Magnesium Sulfate Inj 50%: INTRAMUSCULAR | Qty: 10 | Status: AC

## 2017-03-13 MED FILL — Lidocaine HCl IV Inj 20 MG/ML: INTRAVENOUS | Qty: 10 | Status: AC

## 2017-03-13 MED FILL — Potassium Chloride Inj 2 mEq/ML: INTRAVENOUS | Qty: 40 | Status: AC

## 2017-03-13 MED FILL — Sodium Chloride IV Soln 0.9%: INTRAVENOUS | Qty: 250 | Status: AC

## 2017-03-13 MED FILL — Mannitol IV Soln 20%: INTRAVENOUS | Qty: 500 | Status: AC

## 2017-03-13 MED FILL — Electrolyte-R (PH 7.4) Solution: INTRAVENOUS | Qty: 3000 | Status: AC

## 2017-03-13 MED FILL — Heparin Sodium (Porcine) Inj 1000 Unit/ML: INTRAMUSCULAR | Qty: 30 | Status: AC

## 2017-03-13 MED FILL — Sodium Bicarbonate IV Soln 8.4%: INTRAVENOUS | Qty: 50 | Status: AC

## 2017-03-13 MED FILL — Phenylephrine HCl Inj 10 MG/ML: INTRAMUSCULAR | Qty: 2 | Status: AC

## 2017-03-13 MED FILL — Sodium Chloride IV Soln 0.9%: INTRAVENOUS | Qty: 2000 | Status: AC

## 2017-03-13 NOTE — Progress Notes (Signed)
Patient ID: Miranda [name], female   DOB: 30-May-1966, 51 y.o.   MRN: [id number] SICU Evening Rounds  Hemodynamically stable in sinus rhythm on milrinone 0.3  Diuresing well  CT output fairly low but still bloody so will keep tubes in until tomorrow.  Has been out of bed.

## 2017-03-13 NOTE — Progress Notes (Addendum)
1 Day Post-Op Procedure(s) (LRB): AORTIC VALVE REPLACEMENT (AVR) with Mitral Annuloplasty (N/A) MITRAL VALVE REPAIR (MVR) (N/A) TRANSESOPHAGEAL ECHOCARDIOGRAM (TEE) (N/A) Subjective:  No complaints  Objective: Vital signs in last 24 hours: Temp:  [93.4 F (34.1 C)-100 F (37.8 C)] 99.3 F (37.4 C) (08/10 0700) Pulse Rate:  [80-96] 87 (08/10 0700) Cardiac Rhythm: Normal sinus rhythm (08/10 0400) Resp:  [9-31] 30 (08/10 0700) BP: (81-135)/(60-97) 131/84 (08/10 0700) SpO2:  [95 %-100 %] 97 % (08/10 0700) Arterial Line BP: (79-158)/(55-86) 127/69 (08/10 0700) FiO2 (%):  [40 %-50 %] 40 % (08/09 1805) Weight:  [63.3 kg (139 lb 8.8 oz)] 63.3 kg (139 lb 8.8 oz) (08/10 0500)  Hemodynamic parameters for last 24 hours: PAP: (36-50)/(14-27) 42/17 CO:  [3.4 L/min-4.4 L/min] 3.8 L/min CI:  [2.1 L/min/m2-2.7 L/min/m2] 2.3 L/min/m2  Intake/Output from previous day: 08/09 0701 - 08/10 0700 In: 6279.8 [I.V.:3563.8; Blood:666; IV [id number]:7341] Out: 9379 [Urine:3465; Emesis/NG output:100; Blood:1100; Chest Tube:900] Intake/Output this shift: No intake/output data recorded.  General appearance: alert and cooperative Neurologic: intact Heart: regular rate and rhythm, S1, S2 normal, no murmur, click, rub or gallop Lungs: clear to auscultation bilaterally Extremities: edema mild Wound: dressing dry  Lab Results:  Recent Labs  03/12/17 1857 03/12/17 1904 03/13/17 0306  WBC 5.4  --  7.1  HGB 8.0* 8.5* 8.1*  HCT 25.1* 25.0* 25.6*  PLT 105*  --  121*   BMET:  Recent Labs  03/10/17 1033  03/12/17 1904 03/13/17 0306  NA 138  < > 142 136  K 4.2  < > 4.6 5.2*  CL 107  < > 107 107  CO2 24  --   --  23  GLUCOSE 76  < > 185* 124*  BUN 12  < > 13 11  CREATININE 0.70  < > 0.60 0.65  CALCIUM 9.3  --   --  8.3*  < > = values in this interval not displayed.  PT/INR:  Recent Labs  03/12/17 1315  LABPROT 18.6*  INR 1.53   ABG    Component Value Date/Time   PHART 7.335 (L)  03/12/2017 1954   HCO3 24.3 03/12/2017 1954   TCO2 26 03/12/2017 1954   ACIDBASEDEF 1.0 03/12/2017 1954   O2SAT 49.2 03/13/2017 0323   CBG (last 3)   Recent Labs  03/12/17 1951 03/12/17 2308 03/13/17 0311  GLUCAP 157* 119* 117*   CXR: cardiomegaly, left base atelectasis, mild pulmonary interstitial edema  ECG: sinus, no acute changes.  Assessment/Plan: S/P Procedure(s) (LRB): AORTIC VALVE REPLACEMENT (AVR) with Mitral Annuloplasty (N/A) MITRAL VALVE REPAIR (MVR) (N/A) TRANSESOPHAGEAL ECHOCARDIOGRAM (TEE) (N/A)   Non-ischemic cardiomyopathy due to valvular heart disease with preop EF of 25%, severe AI, moderate MR. She is hemodynamically stable in sinus rhythm. Co-ox is only 49% but CI 2.3. Co-ox may be low due to Hgb of 8.1. Will DC epi and continue milrinone today. Chest tube output still bloody so will leave tubes in for now. Mobilize out of bed to chair Diuresis Expected acute postop blood loss anemia: observe. Start Fe2+ Diabetes control: preop Hgb A1c 6.1. Continue SSI Continue foley due to diuresing patient and patient in ICU Plan short term Coumadin for MV repair but will wait to start this. See progression orders   LOS: 1 day    Miranda [name] 03/13/2017

## 2017-03-13 NOTE — Anesthesia Postprocedure Evaluation (Signed)
Anesthesia Post Note  Patient: Miranda [name]  Procedure(s) Performed: Procedure(s) (LRB): AORTIC VALVE REPLACEMENT (AVR) with Mitral Annuloplasty (N/A) MITRAL VALVE REPAIR (MVR) (N/A) TRANSESOPHAGEAL ECHOCARDIOGRAM (TEE) (N/A)     Patient location during evaluation: SICU Anesthesia Type: General Level of consciousness: sedated and patient remains intubated per anesthesia plan Pain management: pain level controlled Vital Signs Assessment: post-procedure vital signs reviewed and stable Respiratory status: spontaneous breathing and patient remains intubated per anesthesia plan Cardiovascular status: stable Anesthetic complications: no    Last Vitals:  Vitals:   03/13/17 0600 03/13/17 0700  BP: 126/83 131/84  Pulse: 90 87  Resp: (!) 22 (!) 30  Temp: 37.8 C 37.4 C  SpO2: 99% 97%    Last Pain:  Vitals:   03/13/17 0615  TempSrc:   PainSc: [place]

## 2017-03-13 NOTE — Care Management Note (Addendum)
Case Management Note  Patient Details  Name: Miranda [name] MRN: [id number] Date of Birth: July 09, 1966  Subjective/Objective:   From home alone, POD 1 AVR/MV ring, co-ox of 49%, per MD will dc epi, cont milrinone, conts with chest tube, diuresis, for short term coumadin.    8/13 [place], BSN - POD 4 AVR, MVR, co - ox 56 this am, will stop milrinone and recheck co ox at 1 pm.                 Action/Plan: NCM will follow for dc needs.  Expected Discharge Date:  03/19/17               Expected Discharge Plan:     In-House Referral:     Discharge planning Services  CM Consult  Post Acute Care Choice:    Choice offered to:     DME Arranged:    DME Agency:     HH Arranged:    HH Agency:     Status of Service:  In process, will continue to follow  If discussed at Long Length of Stay Meetings, dates discussed:    Additional Comments:  Zenon Mayo, RN 03/13/2017, 12:51 PM

## 2017-03-14 ENCOUNTER — Inpatient Hospital Stay (HOSPITAL_COMMUNITY): Payer: PPO

## 2017-03-14 LAB — BASIC METABOLIC PANEL
Anion gap: 7 (ref 5–15)
BUN: 12 mg/dL (ref 6–20)
CO2: 28 mmol/L (ref 22–32)
Calcium: 8.5 mg/dL — ABNORMAL LOW (ref 8.9–10.3)
Chloride: 97 mmol/L — ABNORMAL LOW (ref 101–111)
Creatinine, Ser: 0.61 mg/dL (ref 0.44–1.00)
GFR calc Af Amer: >60 mL/min (ref >60)
GFR calc non Af Amer: >60 mL/min (ref >60)
Glucose, Bld: 116 mg/dL — ABNORMAL HIGH (ref 65–99)
Potassium: 4.2 mmol/L (ref 3.5–5.1)
Sodium: 132 mmol/L — ABNORMAL LOW (ref 135–145)

## 2017-03-14 LAB — CBC
HCT: 23.1 % — ABNORMAL LOW (ref 36.0–46.0)
HCT: 26.2 % — ABNORMAL LOW (ref 36.0–46.0)
Hemoglobin: 7.4 g/dL — ABNORMAL LOW (ref 12.0–15.0)
Hemoglobin: 8.5 g/dL — ABNORMAL LOW (ref 12.0–15.0)
MCH: 26.6 pg (ref 26.0–34.0)
MCH: 27.1 pg (ref 26.0–34.0)
MCHC: 32 g/dL (ref 30.0–36.0)
MCHC: 32.4 g/dL (ref 30.0–36.0)
MCV: 83.1 fL (ref 78.0–100.0)
MCV: 83.4 fL (ref 78.0–100.0)
Platelets: 109 10*3/uL — ABNORMAL LOW (ref 150–400)
Platelets: 100 10*3/uL — ABNORMAL LOW (ref 150–400)
RBC: 2.78 MIL/uL — ABNORMAL LOW (ref 3.87–5.11)
RBC: 3.14 MIL/uL — ABNORMAL LOW (ref 3.87–5.11)
RDW: 15.6 % — ABNORMAL HIGH (ref 11.5–15.5)
RDW: 15.4 % (ref 11.5–15.5)
WBC: 10.1 10*3/uL (ref 4.0–10.5)
WBC: 11.4 10*3/uL — ABNORMAL HIGH (ref 4.0–10.5)

## 2017-03-14 LAB — PREPARE RBC (CROSSMATCH)

## 2017-03-14 LAB — GLUCOSE, CAPILLARY: Glucose-Capillary: 119 mg/dL — ABNORMAL HIGH (ref 65–99)

## 2017-03-14 LAB — COOXEMETRY PANEL
Carboxyhemoglobin: 1.2 % (ref 0.5–1.5)
Methemoglobin: 1.3 % (ref 0.0–1.5)
O2 Saturation: 54.1 %
Total hemoglobin: 7.8 g/dL — ABNORMAL LOW (ref 12.0–16.0)

## 2017-03-14 MED ORDER — FUROSEMIDE 10 MG/ML IJ SOLN
40.0000 mg | Freq: Once | INTRAMUSCULAR | Status: AC
  Administered 2017-03-14: 40 mg via INTRAVENOUS
  Filled 2017-03-14: qty 4

## 2017-03-14 MED ORDER — SODIUM CHLORIDE 0.9 % IV SOLN
Freq: Once | INTRAVENOUS | Status: AC
  Administered 2017-03-14: 08:00:00 via INTRAVENOUS

## 2017-03-14 NOTE — Progress Notes (Signed)
Patient ID: Miranda [name], female   DOB: Nov 01, 1965, 51 y.o.   MRN: [id number] SICU Evening Rounds:  Hemodynamically stable today on milrinone 0.2  Urine output ok  Hgb improved after transfusion  CBC    Component Value Date/Time   WBC 11.4 (H) 03/14/2017 1713   RBC 3.14 (L) 03/14/2017 1713   HGB 8.5 (L) 03/14/2017 1713   HGB 13.1 01/05/2017 1433   HCT 26.2 (L) 03/14/2017 1713   HCT 41.4 01/05/2017 1433   PLT 100 (L) 03/14/2017 1713   PLT 216 01/05/2017 1433   MCV 83.4 03/14/2017 1713   MCV 87 01/05/2017 1433   MCH 27.1 03/14/2017 1713   MCHC 32.4 03/14/2017 1713   RDW 15.4 03/14/2017 1713   RDW 15.9 (H) 01/05/2017 1433   LYMPHSABS 2.0 11/27/2016 0935   MONOABS 392 09/26/2016 1019   EOSABS 0.1 11/27/2016 0935   BASOSABS 0.0 11/27/2016 0935

## 2017-03-14 NOTE — Progress Notes (Signed)
2 Days Post-Op Procedure(s) (LRB): AORTIC VALVE REPLACEMENT (AVR) with Mitral Annuloplasty (N/A) MITRAL VALVE REPAIR (MVR) (N/A) TRANSESOPHAGEAL ECHOCARDIOGRAM (TEE) (N/A) Subjective: No complaints. Up in chair  Objective: Vital signs in last 24 hours: Temp:  [98.6 F (37 C)-99.3 F (37.4 C)] 99.3 F (37.4 C) (08/11 0400) Pulse Rate:  [83-100] 99 (08/11 0700) Cardiac Rhythm: Normal sinus rhythm (08/11 0400) Resp:  [16-31] 28 (08/10 1600) BP: (98-129)/(68-85) 104/74 (08/11 0700) SpO2:  [90 %-100 %] 96 % (08/11 0700) Arterial Line BP: (96-128)/(54-67) 127/66 (08/10 1700) Weight:  [62.1 kg (136 lb 14.5 oz)] 62.1 kg (136 lb 14.5 oz) (08/11 0600)  Hemodynamic parameters for last 24 hours: PAP: (38)/(14) 38/14  Intake/Output from previous day: 08/10 0701 - 08/11 0700 In: 929.6 [I.V.:829.6; IV Piggyback:100] Out: 3145 [Urine:2915; Chest Tube:230] Intake/Output this shift: No intake/output data recorded.  General appearance: alert and cooperative Neurologic: intact Heart: regular rate and rhythm, S1, S2 normal, no murmur, click, rub or gallop Lungs: clear to auscultation bilaterally Extremities: extremities normal, atraumatic, no cyanosis or edema Wound: dressing dry Chest tube output serosanguinous  Lab Results:  Recent Labs  03/13/17 1652 03/13/17 1705 03/14/17 0352  WBC 9.6  --  10.1  HGB 8.0* 9.9* 7.4*  HCT 25.1* 29.0* 23.1*  PLT 110*  --  109*   BMET:  Recent Labs  03/13/17 0306  03/13/17 1705 03/14/17 0352  NA 136  --  134* 132*  K 5.2*  --  4.7 4.2  CL 107  --  96* 97*  CO2 23  --   --  28  GLUCOSE 124*  --  114* 116*  BUN 11  --  15 12  CREATININE 0.65  < > 0.70 0.61  CALCIUM 8.3*  --   --  8.5*  < > = values in this interval not displayed.  PT/INR:  Recent Labs  03/12/17 1315  LABPROT 18.6*  INR 1.53   ABG    Component Value Date/Time   PHART 7.335 (L) 03/12/2017 1954   HCO3 24.3 03/12/2017 1954   TCO2 26 03/13/2017 1705   ACIDBASEDEF  1.0 03/12/2017 1954   O2SAT 54.1 03/14/2017 0350   CBG (last 3)   Recent Labs  03/13/17 2018 03/13/17 2346 03/14/17 0323  GLUCAP 127* 120* 119*   CXR: left base atelectasis.  Assessment/Plan: S/P Procedure(s) (LRB): AORTIC VALVE REPLACEMENT (AVR) with Mitral Annuloplasty (N/A) MITRAL VALVE REPAIR (MVR) (N/A) TRANSESOPHAGEAL ECHOCARDIOGRAM (TEE) (N/A) POD 2 She is hemodynamically stable on milrinone 0.3 with Co-ox 54% which is probably due to low Hgb of 7.4. Will decrease milrinone to 0.2. Hold off on beta blocker for now until stable off milrinone. Preop EF 25%.  Expected acute postop blood loss anemia: Hgb has drifted down from 8.0 to 7.4 and with poor EF will transfuse a unit of PRBC's this am. Will do follow up CBC this afternoon and decide if she needs any further blood.  Chest tube output low: remove  Hold off on Coumadin today and will probably start tomorrow.  Volume excess: diuresed well yesterday. Wt still 4.5 lbs above preop so will diurese further  IS, begin ambulating today.     LOS: 2 days    Gaye Pollack 03/14/2017

## 2017-03-15 ENCOUNTER — Inpatient Hospital Stay (HOSPITAL_COMMUNITY): Payer: PPO

## 2017-03-15 LAB — CBC
HCT: 25.6 % — ABNORMAL LOW (ref 36.0–46.0)
Hemoglobin: 8.1 g/dL — ABNORMAL LOW (ref 12.0–15.0)
MCH: 26.5 pg (ref 26.0–34.0)
MCHC: 31.6 g/dL (ref 30.0–36.0)
MCV: 83.7 fL (ref 78.0–100.0)
Platelets: 105 10*3/uL — ABNORMAL LOW (ref 150–400)
RBC: 3.06 MIL/uL — ABNORMAL LOW (ref 3.87–5.11)
RDW: 15.6 % — ABNORMAL HIGH (ref 11.5–15.5)
WBC: 10.6 10*3/uL — ABNORMAL HIGH (ref 4.0–10.5)

## 2017-03-15 LAB — COOXEMETRY PANEL
Carboxyhemoglobin: 1.8 % — ABNORMAL HIGH (ref 0.5–1.5)
Carboxyhemoglobin: 1 % (ref 0.5–1.5)
Methemoglobin: 0.7 % (ref 0.0–1.5)
Methemoglobin: 1.1 % (ref 0.0–1.5)
O2 Saturation: 72 %
O2 Saturation: 48.5 %
Total hemoglobin: 8.4 g/dL — ABNORMAL LOW (ref 12.0–16.0)
Total hemoglobin: 9.1 g/dL — ABNORMAL LOW (ref 12.0–16.0)

## 2017-03-15 LAB — BASIC METABOLIC PANEL
Anion gap: 7 (ref 5–15)
BUN: 9 mg/dL (ref 6–20)
CO2: 28 mmol/L (ref 22–32)
Calcium: 8.2 mg/dL — ABNORMAL LOW (ref 8.9–10.3)
Chloride: 99 mmol/L — ABNORMAL LOW (ref 101–111)
Creatinine, Ser: 0.61 mg/dL (ref 0.44–1.00)
GFR calc Af Amer: >60 mL/min (ref >60)
GFR calc non Af Amer: >60 mL/min (ref >60)
Glucose, Bld: 106 mg/dL — ABNORMAL HIGH (ref 65–99)
Potassium: 3.7 mmol/L (ref 3.5–5.1)
Sodium: 134 mmol/L — ABNORMAL LOW (ref 135–145)

## 2017-03-15 MED ORDER — ASPIRIN EC 81 MG PO TBEC
81.0000 mg | DELAYED_RELEASE_TABLET | Freq: Every day | ORAL | Status: DC
  Administered 2017-03-15 – 2017-03-17 (×3): 81 mg via ORAL
  Filled 2017-03-15 (×3): qty 1

## 2017-03-15 MED ORDER — METOLAZONE 2.5 MG PO TABS
2.5000 mg | ORAL_TABLET | Freq: Once | ORAL | Status: AC
  Administered 2017-03-15: 2.5 mg via ORAL
  Filled 2017-03-15: qty 1

## 2017-03-15 MED ORDER — SODIUM CHLORIDE 0.9 % IV SOLN
Freq: Once | INTRAVENOUS | Status: AC
  Administered 2017-03-15: 20:00:00 via INTRAVENOUS

## 2017-03-15 MED ORDER — WARFARIN - PHYSICIAN DOSING INPATIENT
Freq: Every day | Status: DC
  Administered 2017-03-16: 18:00:00

## 2017-03-15 MED ORDER — POTASSIUM CHLORIDE 10 MEQ/50ML IV SOLN
10.0000 meq | INTRAVENOUS | Status: DC | PRN
  Administered 2017-03-15: 10 meq via INTRAVENOUS
  Filled 2017-03-15 (×3): qty 50

## 2017-03-15 MED ORDER — ASPIRIN 81 MG PO CHEW: 81.0000 mg | CHEWABLE_TABLET | Freq: Every day | ORAL | Status: DC

## 2017-03-15 MED ORDER — FUROSEMIDE 10 MG/ML IJ SOLN
40.0000 mg | Freq: Once | INTRAMUSCULAR | Status: AC
  Administered 2017-03-15: 40 mg via INTRAVENOUS
  Filled 2017-03-15: qty 4

## 2017-03-15 MED ORDER — POTASSIUM CHLORIDE CRYS ER 20 MEQ PO TBCR
20.0000 meq | EXTENDED_RELEASE_TABLET | Freq: Three times a day (TID) | ORAL | Status: AC
  Administered 2017-03-15 (×3): 20 meq via ORAL
  Filled 2017-03-15 (×3): qty 1

## 2017-03-15 MED ORDER — WARFARIN SODIUM 2.5 MG PO TABS
2.5000 mg | ORAL_TABLET | Freq: Once | ORAL | Status: AC
  Administered 2017-03-15: 2.5 mg via ORAL
  Filled 2017-03-15: qty 1

## 2017-03-15 MED ORDER — MILRINONE LACTATE IN DEXTROSE 20-5 MG/100ML-% IV SOLN
0.1000 ug/kg/min | INTRAVENOUS | Status: DC
  Administered 2017-03-15: 0.1 ug/kg/min via INTRAVENOUS
  Filled 2017-03-15: qty 100

## 2017-03-15 NOTE — Progress Notes (Signed)
3 Days Post-Op Procedure(s) (LRB): AORTIC VALVE REPLACEMENT (AVR) with Mitral Annuloplasty (N/A) MITRAL VALVE REPAIR (MVR) (N/A) TRANSESOPHAGEAL ECHOCARDIOGRAM (TEE) (N/A) Subjective: No complaints. Slept some, breathing ok  Objective: Vital signs in last 24 hours: Temp:  [98.6 F (37 C)-99.6 F (37.6 C)] 98.6 F (37 C) (08/12 0700) Pulse Rate:  [94-106] 100 (08/12 0700) Cardiac Rhythm: Normal sinus rhythm (08/11 2300) Resp:  [27-28] 28 (08/11 1115) BP: (92-127)/(70-108) 110/90 (08/12 0700) SpO2:  [94 %-99 %] 95 % (08/12 0700) Weight:  [61.1 kg (134 lb 11.2 oz)] 61.1 kg (134 lb 11.2 oz) (08/12 0600)  Hemodynamic parameters for last 24 hours:    Intake/Output from previous day: 08/11 0701 - 08/12 0700 In: 1540 [P.O.:240; I.V.:545; Blood:755] Out: 1660 [ZOXWR:6045; Chest Tube:10] Intake/Output this shift: No intake/output data recorded.  General appearance: alert, cooperative and flat affect Neurologic: intact Heart: regular rate and rhythm, S1, S2 normal, no murmur, click, rub or gallop Lungs: diminished breath sounds bibasilar Extremities: extremities normal, atraumatic, no cyanosis or edema Wound: incision ok  Lab Results:  Recent Labs  03/14/17 1713 03/15/17 0351  WBC 11.4* 10.6*  HGB 8.5* 8.1*  HCT 26.2* 25.6*  PLT 100* 105*   BMET:  Recent Labs  03/14/17 0352 03/15/17 0351  NA 132* 134*  K 4.2 3.7  CL 97* 99*  CO2 28 28  GLUCOSE 116* 106*  BUN 12 9  CREATININE 0.61 0.61  CALCIUM 8.5* 8.2*    PT/INR:  Recent Labs  03/12/17 1315  LABPROT 18.6*  INR 1.53   ABG    Component Value Date/Time   PHART 7.335 (L) 03/12/2017 1954   HCO3 24.3 03/12/2017 1954   TCO2 26 03/13/2017 1705   ACIDBASEDEF 1.0 03/12/2017 1954   O2SAT 72.0 03/15/2017 0408   CBG (last 3)   Recent Labs  03/13/17 2018 03/13/17 2346 03/14/17 0323  GLUCAP 127* 120* 119*    Assessment/Plan: S/P Procedure(s) (LRB): AORTIC VALVE REPLACEMENT (AVR) with Mitral  Annuloplasty (N/A) MITRAL VALVE REPAIR (MVR) (N/A) TRANSESOPHAGEAL ECHOCARDIOGRAM (TEE) (N/A)  She is hemodynamically stable in sinus rhythm on milrinone 0.2. Co-ox is 72% this am so will wean off milrinone and check Co-ox at 5 pm.  Anemia: Hgb fairly stable after transfusion. She is on iron. Will follow.  Volume excess: Continue diuresis  Ambulate and IS  Start coumadin today with MV repair and AVR for three months and then will switch to ASA.   LOS: 3 days    Gaye Pollack 03/15/2017

## 2017-03-15 NOTE — Progress Notes (Signed)
Dr. Cyndia Bent made aware of patients co-ox. Orders received to give one unit of blood and restart Milrinone at 0.1

## 2017-03-16 LAB — COOXEMETRY PANEL
Carboxyhemoglobin: 1.2 % (ref 0.5–1.5)
Carboxyhemoglobin: 1.8 % — ABNORMAL HIGH (ref 0.5–1.5)
Methemoglobin: 1.1 % (ref 0.0–1.5)
Methemoglobin: 0.9 % (ref 0.0–1.5)
O2 Saturation: 56.1 %
O2 Saturation: 57.4 %
Total hemoglobin: 10.1 g/dL — ABNORMAL LOW (ref 12.0–16.0)
Total hemoglobin: 10.5 g/dL — ABNORMAL LOW (ref 12.0–16.0)

## 2017-03-16 LAB — BASIC METABOLIC PANEL
Anion gap: 7 (ref 5–15)
BUN: 10 mg/dL (ref 6–20)
CO2: 27 mmol/L (ref 22–32)
Calcium: 8.6 mg/dL — ABNORMAL LOW (ref 8.9–10.3)
Chloride: 102 mmol/L (ref 101–111)
Creatinine, Ser: 0.61 mg/dL (ref 0.44–1.00)
GFR calc Af Amer: >60 mL/min (ref >60)
GFR calc non Af Amer: >60 mL/min (ref >60)
Glucose, Bld: 95 mg/dL (ref 65–99)
Potassium: 4.2 mmol/L (ref 3.5–5.1)
Sodium: 136 mmol/L (ref 135–145)

## 2017-03-16 LAB — BPAM RBC
Blood Product Expiration Date: 201808292359
Blood Product Expiration Date: 201808292359
ISSUE DATE / TIME: 201808110811
ISSUE DATE / TIME: 201808121941
Unit Type and Rh: 6200
Unit Type and Rh: 6200

## 2017-03-16 LAB — CBC
HCT: 29.6 % — ABNORMAL LOW (ref 36.0–46.0)
Hemoglobin: 9.6 g/dL — ABNORMAL LOW (ref 12.0–15.0)
MCH: 27.4 pg (ref 26.0–34.0)
MCHC: 32.4 g/dL (ref 30.0–36.0)
MCV: 84.6 fL (ref 78.0–100.0)
Platelets: 150 10*3/uL (ref 150–400)
RBC: 3.5 MIL/uL — ABNORMAL LOW (ref 3.87–5.11)
RDW: 16.4 % — ABNORMAL HIGH (ref 11.5–15.5)
WBC: 8.2 10*3/uL (ref 4.0–10.5)

## 2017-03-16 LAB — TYPE AND SCREEN
ABO/RH(D): A POS
Antibody Screen: NEGATIVE
Unit division: 0
Unit division: 0

## 2017-03-16 LAB — PROTIME-INR
INR: 1.14
Prothrombin Time: 14.6 seconds (ref 11.4–15.2)

## 2017-03-16 LAB — PREPARE RBC (CROSSMATCH)

## 2017-03-16 MED ORDER — FUROSEMIDE 40 MG PO TABS
40.0000 mg | ORAL_TABLET | Freq: Every day | ORAL | Status: DC
  Administered 2017-03-16 – 2017-03-19 (×4): 40 mg via ORAL
  Filled 2017-03-16 (×4): qty 1

## 2017-03-16 MED ORDER — POTASSIUM CHLORIDE CRYS ER 20 MEQ PO TBCR
20.0000 meq | EXTENDED_RELEASE_TABLET | Freq: Every day | ORAL | Status: DC
  Administered 2017-03-16 – 2017-03-19 (×4): 20 meq via ORAL
  Filled 2017-03-16 (×4): qty 1

## 2017-03-16 MED ORDER — WARFARIN SODIUM 5 MG PO TABS
5.0000 mg | ORAL_TABLET | Freq: Once | ORAL | Status: AC
  Administered 2017-03-16: 5 mg via ORAL
  Filled 2017-03-16: qty 1

## 2017-03-16 NOTE — Progress Notes (Signed)
4 Days Post-Op Procedure(s) (LRB): AORTIC VALVE REPLACEMENT (AVR) with Mitral Annuloplasty (N/A) MITRAL VALVE REPAIR (MVR) (N/A) TRANSESOPHAGEAL ECHOCARDIOGRAM (TEE) (N/A) Subjective: No complaints. Ambulated well yesterday without shortness of breath.  Her Co-ox yesterday afternoon was 48.5 off milrinone and Hgb was only 8.1 so I put her back on 0.1 milrinone and transfused a unit of PRBC's. Co-ox this am is 56%.  Objective: Vital signs in last 24 hours: Temp:  [91.6 F (33.1 C)-99.1 F (37.3 C)] 91.6 F (33.1 C) (08/13 0300) Pulse Rate:  [94-106] 100 (08/13 0700) Cardiac Rhythm: Normal sinus rhythm (08/12 2000) Resp:  [18-20] 20 (08/12 2245) BP: (90-126)/(65-90) 116/88 (08/13 0600) SpO2:  [91 %-100 %] 94 % (08/13 0700) Weight:  [60.8 kg (134 lb 0.6 oz)] 60.8 kg (134 lb 0.6 oz) (08/13 0500)  Hemodynamic parameters for last 24 hours:    Intake/Output from previous day: 08/12 0701 - 08/13 0700 In: 1050.2 [P.O.:480; I.V.:150.2; Blood:370; IV Piggyback:50] Out: 1400 [Urine:1400] Intake/Output this shift: No intake/output data recorded.  General appearance: alert and cooperative Neurologic: intact Heart: regular rate and rhythm, S1, S2 normal, no murmur, click, rub or gallop Lungs: diminished breath sounds bibasilar Extremities: extremities normal, atraumatic, no cyanosis or edema Wound: dressing dry  Lab Results:  Recent Labs  03/15/17 0351 03/16/17 0519  WBC 10.6* 8.2  HGB 8.1* 9.6*  HCT 25.6* 29.6*  PLT 105* 150   BMET:  Recent Labs  03/15/17 0351 03/16/17 0519  NA 134* 136  K 3.7 4.2  CL 99* 102  CO2 28 27  GLUCOSE 106* 95  BUN 9 10  CREATININE 0.61 0.61  CALCIUM 8.2* 8.6*    PT/INR:  Recent Labs  03/16/17 0519  LABPROT 14.6  INR 1.14   ABG    Component Value Date/Time   PHART 7.335 (L) 03/12/2017 1954   HCO3 24.3 03/12/2017 1954   TCO2 26 03/13/2017 1705   ACIDBASEDEF 1.0 03/12/2017 1954   O2SAT 56.1 03/16/2017 0520   CBG (last 3)    Recent Labs  03/13/17 2018 03/13/17 2346 03/14/17 0323  GLUCAP 127* 120* 119*    Assessment/Plan: S/P Procedure(s) (LRB): AORTIC VALVE REPLACEMENT (AVR) with Mitral Annuloplasty (N/A) MITRAL VALVE REPAIR (MVR) (N/A) TRANSESOPHAGEAL ECHOCARDIOGRAM (TEE) (N/A) POD 4 She is hemodynamically stable in sinus rhythm. Co-ox is 56 so will stop milrinone and check it at 1 pm.   Continue IS, ambulation  INR 1.14. Will give 5 mg coumadin today.   LOS: 4 days    Gaye Pollack 03/16/2017

## 2017-03-16 NOTE — Plan of Care (Signed)
Problem: Pain Management: Goal: Pain level will decrease Outcome: Progressing Pt denies pain and when complaints of pain occur managed with current medications

## 2017-03-16 NOTE — Plan of Care (Signed)
Problem: Activity: Goal: Risk for activity intolerance will decrease Outcome: Progressing Pt ambulated without shortness of breath

## 2017-03-17 ENCOUNTER — Inpatient Hospital Stay (HOSPITAL_COMMUNITY): Payer: PPO

## 2017-03-17 ENCOUNTER — Encounter (HOSPITAL_COMMUNITY): Payer: Self-pay | Admitting: *Deleted

## 2017-03-17 LAB — PROTIME-INR
INR: 1.08
Prothrombin Time: 14 seconds (ref 11.4–15.2)

## 2017-03-17 MED ORDER — SODIUM CHLORIDE 0.9% FLUSH
3.0000 mL | Freq: Two times a day (BID) | INTRAVENOUS | Status: DC
  Administered 2017-03-17 – 2017-03-18 (×3): 3 mL via INTRAVENOUS

## 2017-03-17 MED ORDER — PANTOPRAZOLE SODIUM 40 MG PO TBEC
40.0000 mg | DELAYED_RELEASE_TABLET | Freq: Every day | ORAL | Status: DC
  Administered 2017-03-18 – 2017-03-19 (×2): 40 mg via ORAL
  Filled 2017-03-17 (×2): qty 1

## 2017-03-17 MED ORDER — ONDANSETRON HCL 4 MG PO TABS: 4.0000 mg | ORAL_TABLET | Freq: Four times a day (QID) | ORAL | Status: DC | PRN

## 2017-03-17 MED ORDER — DOCUSATE SODIUM 100 MG PO CAPS
200.0000 mg | ORAL_CAPSULE | Freq: Every day | ORAL | Status: DC
  Administered 2017-03-18: 200 mg via ORAL
  Filled 2017-03-17 (×2): qty 2

## 2017-03-17 MED ORDER — ASPIRIN EC 81 MG PO TBEC
81.0000 mg | DELAYED_RELEASE_TABLET | Freq: Every day | ORAL | Status: DC
  Administered 2017-03-18: 81 mg via ORAL
  Filled 2017-03-17 (×2): qty 1

## 2017-03-17 MED ORDER — WARFARIN SODIUM 5 MG PO TABS
5.0000 mg | ORAL_TABLET | Freq: Once | ORAL | Status: AC
  Administered 2017-03-17: 5 mg via ORAL
  Filled 2017-03-17: qty 1

## 2017-03-17 MED ORDER — MOVING RIGHT ALONG BOOK
Freq: Once | Status: AC
  Administered 2017-03-17: 17:00:00
  Filled 2017-03-17: qty 1

## 2017-03-17 MED ORDER — SODIUM CHLORIDE 0.9% FLUSH: 3.0000 mL | INTRAVENOUS | Status: DC | PRN

## 2017-03-17 MED ORDER — CARVEDILOL 3.125 MG PO TABS
3.1250 mg | ORAL_TABLET | Freq: Two times a day (BID) | ORAL | Status: DC
  Administered 2017-03-17 – 2017-03-18 (×3): 3.125 mg via ORAL
  Filled 2017-03-17 (×3): qty 1

## 2017-03-17 MED ORDER — BISACODYL 5 MG PO TBEC: 10.0000 mg | DELAYED_RELEASE_TABLET | Freq: Every day | ORAL | Status: DC | PRN

## 2017-03-17 MED ORDER — ONDANSETRON HCL 4 MG/2ML IJ SOLN: 4.0000 mg | Freq: Four times a day (QID) | INTRAMUSCULAR | Status: DC | PRN

## 2017-03-17 MED ORDER — ACETAMINOPHEN 325 MG PO TABS
650.0000 mg | ORAL_TABLET | Freq: Four times a day (QID) | ORAL | Status: DC | PRN
  Administered 2017-03-17 – 2017-03-18 (×4): 650 mg via ORAL
  Filled 2017-03-17 (×4): qty 2

## 2017-03-17 MED ORDER — BISACODYL 10 MG RE SUPP
10.0000 mg | Freq: Every day | RECTAL | Status: DC | PRN
Start: 2017-03-17 — End: 2017-03-19

## 2017-03-17 MED ORDER — OXYCODONE HCL 5 MG PO TABS: 5.0000 mg | ORAL_TABLET | ORAL | Status: DC | PRN

## 2017-03-17 MED ORDER — LISINOPRIL 10 MG PO TABS
10.0000 mg | ORAL_TABLET | Freq: Every day | ORAL | Status: DC
  Administered 2017-03-17 – 2017-03-19 (×3): 10 mg via ORAL
  Filled 2017-03-17 (×3): qty 1

## 2017-03-17 MED ORDER — TRAMADOL HCL 50 MG PO TABS
50.0000 mg | ORAL_TABLET | ORAL | Status: DC | PRN
  Administered 2017-03-18 (×2): 100 mg via ORAL
  Filled 2017-03-17 (×2): qty 2

## 2017-03-17 MED ORDER — PROMETHAZINE HCL 25 MG/ML IJ SOLN
12.5000 mg | INTRAMUSCULAR | Status: DC | PRN
  Administered 2017-03-17: 12.5 mg via INTRAVENOUS
  Filled 2017-03-17: qty 1

## 2017-03-17 MED ORDER — SODIUM CHLORIDE 0.9 % IV SOLN: 250.0000 mL | INTRAVENOUS | Status: DC | PRN

## 2017-03-17 NOTE — Progress Notes (Signed)
Pt arrived from [facility] to [place]. Pt connected to telemetry. Dr. Roxy Manns on call for Dr. Cyndia Bent paged because pt is complaining of nausea and is not due for zofran until 2100. MD ordered phenergan PRN.

## 2017-03-17 NOTE — Discharge Summary (Signed)
[address]       [place] [address]             [phone]      Physician Discharge Summary   Patient ID: Miranda [name] MRN: [id number] DOB/AGE: [birth year] 51 y.o.  Admit date: 03/12/2017 Discharge date: 03/19/2017  Admission Diagnoses: Patient Active Problem List   Diagnosis Date Noted  . Aortic insufficiency 03/12/2017  . Cardiomyopathy, nonischemic ([place]) 01/30/2017  . Hyperlipidemia 11/11/2013  . HTN (hypertension) 09/14/2013  . Cyst of brain 08/10/2013  . Obstructive airway disease ([place]) 06/01/2013  . Dizziness 05/02/2013  . Systolic CHF ([place]anda) 00/86/7619  . Depression 03/28/2013  . Hypothyroidism 12/21/2012  . Uterine fibroid 10/26/2012  . Anemia - microcytic presumed secondary to menorrhagia 10/25/2012    Discharge Diagnoses:  Active Problems:   Aortic insufficiency   Discharged Condition: good  HPI:   The patient is a 51 year old woman with hypertension, hypothyroidism, depression on meds and a history of non-ischemic cardiomyopathy dating back to 2014 when she presented with congestive heart failure and an echo 10/27/2016 showed an EF of 15-20% with diffuse hypokinesis, mild MR and no AS or AI. She had a cath at that time that showed no significant coronary disease. She was treated medically and a follow up echo in 01/2013 showed some improvement in the LVEF to 35-40%. Her next echo in 10/2013 showed further improvement in the EF to 50% with no AS or AI. There was mild aortic root dilation to 37 mm. She was seen in 04/2016 and was doing well on an ACE I and BB with no diuretic. She presented in April with sudden onset of shortness of breath and swelling in her legs and abdomen. She was started on lasix but her SOB did not improve until her lasix was titrated up for a few days. She had a follow up echo on 5/9 that showed new severe central AI with an drop in her LVEF to 30-35% with diffuse hypokinesis and grade 3 diastolic dysfunction. There was  also moderate MR with a mildly restricted posterior leaflet and annular dilation. She was seen by me on 01/01/2017 and underwent dental evaluation and extraction by Dr. Enrique Sack.  She lives alone and takes care of herself. She does not work and is on disability. She has depression on meds but it has been fairly well controlled and she has not been hospitalized for it. Her father is her closest family and is with her today but stayed in the lobby.    Hospital Course:  On 03/12/2017 Miranda [name] underwent an Aortic valve replacement with Dr. Cyndia Bent. She tolerated the procedure well and was transferred to the ICU. She was extubated in a timely manner. POD 1 she continued to progress. We continued milrinone for inotropic support and discontinued epinephrine. We left the chest tubes in place. We initiated a diuretic regimen for fluid overload. We worked on blood glucose control. POD 2 we began to ambulate the patient. Her co-ox remained low however she was anemic. We transfused one unit of packed red blood cells with a CBC in the afternoon to monitor. We removed her chest tubes. POD 3 we continued to wean milrinone to off. Her co-ox improved. We started iron for anemia s/p transfusion. We continued her diuretic regimen for fluid overload. We started coumadin for MV repair thrombosis prophylaxis.  Her INR is trending up, currently at 1.83.  She will be discharged home on 5 mg coumadin  daily.  She continued to make progress.  She was transferred to the telemetry unit on 03/17/2017.  She is maintaining Sinus rhythm, tachycardic at times.  Her Coreg has been titrated and her pacing wires were removed without difficulty.  She is ambulating independently and is felt medically stable for discharge home today.   Consults: None  Significant Diagnostic Studies:  CLINICAL DATA:  Aortic valve replacement.  Sore chest  EXAM: PORTABLE CHEST 1 VIEW  COMPARISON:  Two days ago  FINDINGS: Status post aortic and mitral  valve repair. Stable cardiopericardial enlargement. Streaky opacities at the bases consistent with atelectasis. Improved aeration with better visualized left diaphragm. No edema, effusion, or pneumothorax. Removed IJ sheath.  IMPRESSION: Improving atelectasis at the bases.  No pulmonary edema.   Electronically Signed   By: Monte Fantasia M.D.   On: 03/17/2017 07:37   Treatments:   CARDIOVASCULAR SURGERY OPERATIVE NOTE  03/12/2017 [name] [id number]  Surgeon:  Gaye Pollack, MD  First Assistant: Lars Pinks,  PA-C   Preoperative Diagnosis:  Severe aortic insufficiency, moderate mitral regurgitation, severe LV systolic dysfunction.   Postoperative Diagnosis:  Same   Procedure:  1. Median Sternotomy 2. Extracorporeal circulation 3.   Aortic valve replacement using a 23 mm Edwards Magna-Ease pericardial valve. 4.   Mitral valve annuloplasty using a 28 mm Sorin 3D MEMO ring.  Anesthesia:  General Endotracheal  Disposition: 01-Home or Self Care   Discharge medications:  The patient has been discharged on:   1.Beta Blocker:  Yes [ x  ]                              No   [   ]                              If No, reason:  2.Ace Inhibitor/ARB: Yes [ x  ]                                     No  [    ]                                     If No, reason:  3.Statin:   Yes [  x ]                  No  [   ]                  If No, reason:  4.Ecasa:  Yes  [ x  ]                  No   [   ]                  If No, reason:   Discharge Instructions    Amb Referral to Cardiac Rehabilitation    Complete by:  As directed    Diagnosis:   Valve Repair Valve Replacement     Valve:   Aortic Mitral       Allergies as of 03/19/2017      Reactions   Codeine Hives      Medication List    STOP taking  these medications   Soy Isoflavone 40 MG Tabs     TAKE these medications   acetaminophen 500 MG tablet Commonly known as:   TYLENOL Take 500 mg by mouth daily as needed for headache (pain).   aspirin 81 MG EC tablet Take 1 tablet (81 mg total) by mouth daily.   atorvastatin 20 MG tablet Commonly known as:  LIPITOR TAKE ONE TABLET BY MOUTH ONCE DAILY AT 6 PM   buPROPion 150 MG 24 hr tablet Commonly known as:  WELLBUTRIN XL TAKE ONE TABLET BY MOUTH ONCE DAILY   carvedilol 6.25 MG tablet Commonly known as:  COREG TAKE ONE TABLET BY MOUTH TWICE DAILY   Ferrous Fumarate 324 (106 Fe) MG Tabs tablet Commonly known as:  HEMOCYTE - 106 mg FE Take 1 tablet (106 mg of iron total) by mouth 2 (two) times daily.   fluticasone 50 MCG/ACT nasal spray Commonly known as:  FLONASE Place 1 spray into both nostrils daily as needed for allergies or rhinitis.   furosemide 40 MG tablet Commonly known as:  LASIX Take 40 mg by mouth daily. Monday and thurday take 60mg    levothyroxine 112 MCG tablet Commonly known as:  SYNTHROID, LEVOTHROID TAKE ONE TABLET BY MOUTH ONCE DAILY BEFORE BREAKFAST   lisinopril 10 MG tablet Commonly known as:  PRINIVIL,ZESTRIL Take 1 tablet (10 mg total) by mouth daily.   mirtazapine 7.5 MG tablet Commonly known as:  REMERON Take 1 tablet (7.5 mg total) by mouth at bedtime.   potassium chloride SA 20 MEQ tablet Commonly known as:  K-DUR,KLOR-CON Take 1 tablet (20 mEq total) by mouth daily.   traMADol 50 MG tablet Commonly known as:  ULTRAM Take 1-2 tablets (50-100 mg total) by mouth every 4 (four) hours as needed for moderate pain.   traZODone 150 MG tablet Commonly known as:  DESYREL Take 1 tablet (150 mg total) by mouth at bedtime.   warfarin 5 MG tablet Commonly known as:  COUMADIN Take 1 tablet (5 mg total) by mouth daily.      Follow-up Information    [place] [facility] Follow up on 03/23/2017.   Specialty:  Cardiology Why:  Appointment is at 2:30  Contact information: [address] [place]        Isaiah Serge, NP Follow up on 04/07/2017.   Specialties:  Cardiology, Radiology Why:  Appointment is at 11:30 Contact information: [place] [address] [phone]        Gaye Pollack, MD Follow up on 04/22/2017.   Specialty:  Cardiothoracic Surgery Why:  Appointment is at 10:30, please get CXR at 10:00 Contact information: [address] [place] [address] [phone]           Signed: Ellwood Handler 03/19/2017, 7:56 AM

## 2017-03-17 NOTE — Progress Notes (Signed)
Report called to [facility] on 4E. Pt made aware of new room 4E04. Pt called son to make aware. Will continue to monitor until pt transferred.

## 2017-03-17 NOTE — Progress Notes (Signed)
5 Days Post-Op Procedure(s) (LRB): AORTIC VALVE REPLACEMENT (AVR) with Mitral Annuloplasty (N/A) MITRAL VALVE REPAIR (MVR) (N/A) TRANSESOPHAGEAL ECHOCARDIOGRAM (TEE) (N/A) Subjective: No complaints  Objective: Vital signs in last 24 hours: Temp:  [98.6 F (37 C)-99.8 F (37.7 C)] 99 F (37.2 C) (08/14 0300) Pulse Rate:  [93-110] 98 (08/14 0720) Cardiac Rhythm: Heart block (08/13 1600) Resp:  [18-20] 18 (08/14 0720) BP: (109-142)/(77-102) 132/91 (08/14 0720) SpO2:  [90 %-98 %] 96 % (08/14 0720) Weight:  [58.9 kg (129 lb 13.6 oz)] 58.9 kg (129 lb 13.6 oz) (08/14 0300)  Hemodynamic parameters for last 24 hours:    Intake/Output from previous day: 08/13 0701 - 08/14 0700 In: 608.3 [P.O.:590; I.V.:18.3] Out: 2250 [Urine:2250] Intake/Output this shift: No intake/output data recorded.  General appearance: alert and cooperative Neurologic: intact Heart: regular rate and rhythm, S1, S2 normal, no murmur, click, rub or gallop Lungs: clear to auscultation bilaterally Extremities: extremities normal, atraumatic, no cyanosis or edema Wound: incision ok  Lab Results:  Recent Labs  03/15/17 0351 03/16/17 0519  WBC 10.6* 8.2  HGB 8.1* 9.6*  HCT 25.6* 29.6*  PLT 105* 150   BMET:  Recent Labs  03/15/17 0351 03/16/17 0519  NA 134* 136  K 3.7 4.2  CL 99* 102  CO2 28 27  GLUCOSE 106* 95  BUN 9 10  CREATININE 0.61 0.61  CALCIUM 8.2* 8.6*    PT/INR:  Recent Labs  03/17/17 0228  LABPROT 14.0  INR 1.08   ABG    Component Value Date/Time   PHART 7.335 (L) 03/12/2017 1954   HCO3 24.3 03/12/2017 1954   TCO2 26 03/13/2017 1705   ACIDBASEDEF 1.0 03/12/2017 1954   O2SAT 57.4 03/16/2017 1300   CBG (last 3)  No results for input(s): GLUCAP in the last 72 hours.  CLINICAL DATA:  Aortic valve replacement.  Sore chest  EXAM: PORTABLE CHEST 1 VIEW  COMPARISON:  Two days ago  FINDINGS: Status post aortic and mitral valve repair. Stable  cardiopericardial enlargement. Streaky opacities at the bases consistent with atelectasis. Improved aeration with better visualized left diaphragm. No edema, effusion, or pneumothorax. Removed IJ sheath.  IMPRESSION: Improving atelectasis at the bases.  No pulmonary edema.   Electronically Signed   By: Monte Fantasia M.D.   On: 03/17/2017 07:37  Assessment/Plan: S/P Procedure(s) (LRB): AORTIC VALVE REPLACEMENT (AVR) with Mitral Annuloplasty (N/A) MITRAL VALVE REPAIR (MVR) (N/A) TRANSESOPHAGEAL ECHOCARDIOGRAM (TEE) (N/A) POD 5.  She is doing well overall and hemodynamically stable in sinus rhythm. Will add low dose Coreg and Lisinopril.  Volume excess resolved. Weight is below preop.  INR has not bumped yet. Continue coumadin 5 mg daily.  DC PW in am tomorrow.   Home when INR bumps so we know what dose of Coumadin to send her home on. She will need Coumadin for 3 months with MV repair and bioprosthetic AVR.  Transfer to 4E   LOS: 5 days    Miranda [name] 03/17/2017

## 2017-03-18 LAB — PROTIME-INR
INR: 1.51
Prothrombin Time: 18.4 seconds — ABNORMAL HIGH (ref 11.4–15.2)

## 2017-03-18 MED ORDER — WARFARIN - PHYSICIAN DOSING INPATIENT
Freq: Every day | Status: DC
  Administered 2017-03-18: 18:00:00

## 2017-03-18 MED ORDER — WARFARIN VIDEO
Freq: Once | Status: AC
  Administered 2017-03-18: 18:00:00

## 2017-03-18 MED ORDER — CARVEDILOL 6.25 MG PO TABS
6.2500 mg | ORAL_TABLET | Freq: Two times a day (BID) | ORAL | Status: DC
  Administered 2017-03-18 – 2017-03-19 (×2): 6.25 mg via ORAL
  Filled 2017-03-18 (×2): qty 1

## 2017-03-18 MED ORDER — PATIENT'S GUIDE TO USING COUMADIN BOOK
Freq: Once | Status: AC
  Administered 2017-03-18: 18:00:00
  Filled 2017-03-18: qty 1

## 2017-03-18 MED ORDER — WARFARIN SODIUM 4 MG PO TABS
4.0000 mg | ORAL_TABLET | Freq: Once | ORAL | Status: AC
  Administered 2017-03-18: 4 mg via ORAL
  Filled 2017-03-18: qty 1

## 2017-03-18 NOTE — Progress Notes (Signed)
CARDIAC REHAB PHASE I   Pt just returned from ambulating independently, second walk today, declines additional ambulation at this time, states she will walk independently. Cardiac surgery discharge education completed. Reviewed IS, sternal precautions, activity progression, exercise, heart healthy, sodium restrictions, s/s CHF, daily weights and phase 2 cardiac rehab. Pt verbalized understanding. Pt agrees to phase 2 cardiac rehab referral, will send to [facility] per pt request. Pt in recliner, call bell within reach.   [place], RN, BSN  03/18/2017 11:23 AM

## 2017-03-18 NOTE — Progress Notes (Signed)
EPWs pulled per protocol, no ectopy or other problems noted at this time.  Instructed on need for 1hr of bed rest with VS monitoring.  Will continue to monitor.

## 2017-03-18 NOTE — Care Management Important Message (Signed)
Important Message  Patient Details  Name: Miranda [name] MRN: [id number] Date of Birth: 03-09-1966   Medicare Important Message Given:  Yes    Nathen May 03/18/2017, 9:31 AM

## 2017-03-18 NOTE — Progress Notes (Addendum)
      [address]       [place] [address]             [phone]      6 Days Post-Op Procedure(s) (LRB): AORTIC VALVE REPLACEMENT (AVR) with Mitral Annuloplasty (N/A) MITRAL VALVE REPAIR (MVR) (N/A) TRANSESOPHAGEAL ECHOCARDIOGRAM (TEE) (N/A)   Subjective:  Patient without complaints.  Getting ready to go for a walk.    Objective: Vital signs in last 24 hours: Temp:  [98.7 F (37.1 C)-99.3 F (37.4 C)] 98.9 F (37.2 C) (08/15 0457) Pulse Rate:  [95-106] 95 (08/15 0843) Cardiac Rhythm: Sinus tachycardia (08/15 0700) Resp:  [9-26] 26 (08/15 0843) BP: (107-134)/(80-96) 107/81 (08/15 0843) SpO2:  [92 %-100 %] 98 % (08/15 0843) Weight:  [129 lb 8 oz (58.7 kg)] 129 lb 8 oz (58.7 kg) (08/15 0457)  Intake/Output from previous day: 08/14 0701 - 08/15 0700 In: 483 [P.O.:480; I.V.:3] Out: 1200 [Urine:1200]  General appearance: alert, cooperative and no distress Heart: regular rate and rhythm Lungs: clear to auscultation bilaterally Abdomen: soft, non-tender; bowel sounds normal; no masses,  no organomegaly Extremities: extremities normal, atraumatic, no cyanosis or edema Wound: clean and dry  Lab Results:  Recent Labs  03/16/17 0519  WBC 8.2  HGB 9.6*  HCT 29.6*  PLT 150   BMET:  Recent Labs  03/16/17 0519  NA 136  K 4.2  CL 102  CO2 27  GLUCOSE 95  BUN 10  CREATININE 0.61  CALCIUM 8.6*    PT/INR:  Recent Labs  03/18/17 0232  LABPROT 18.4*  INR 1.51   ABG    Component Value Date/Time   PHART 7.335 (L) 03/12/2017 1954   HCO3 24.3 03/12/2017 1954   TCO2 26 03/13/2017 1705   ACIDBASEDEF 1.0 03/12/2017 1954   O2SAT 57.4 03/16/2017 1300   CBG (last 3)  No results for input(s): GLUCAP in the last 72 hours.  Assessment/Plan: S/P Procedure(s) (LRB): AORTIC VALVE REPLACEMENT (AVR) with Mitral Annuloplasty (N/A) MITRAL VALVE REPAIR (MVR) (N/A) TRANSESOPHAGEAL ECHOCARDIOGRAM (TEE) (N/A)  1. CV- Sinus Tach, BP ranging up into the  130s- will increase Coreg to home dose of 6.25, continue lisinopril 2. INR up to 1.51 from 1.08 yesterday- will decrease coumadin to 4 mg daily 3. Pulm- no acute issues, continue IS 4. Renal- weight is below baseline, continue diuretics 5. Dispo- patient stable, INR big jump to 1.5 today, will get EPW, titrate coreg for better HR control, decrease coumadin to 4 mg... If INR is therapeutic will be ready for d/c tomorrow   LOS: 6 days    Ellwood Handler 03/18/2017   Chart reviewed, patient examined, agree with above. She is doing well overall. INR up to 1.5. Will see what it is tomorrow and decide what dose of Coumadin to send her home on. She will need INR followed up early next week.

## 2017-03-18 NOTE — Discharge Instructions (Signed)

## 2017-03-19 LAB — PROTIME-INR
INR: 1.83
Prothrombin Time: 21.4 seconds — ABNORMAL HIGH (ref 11.4–15.2)

## 2017-03-19 MED ORDER — TRAMADOL HCL 50 MG PO TABS: 50.0000 mg | ORAL_TABLET | ORAL | 0 refills | Status: DC | PRN

## 2017-03-19 MED ORDER — WARFARIN SODIUM 5 MG PO TABS: 5.0000 mg | ORAL_TABLET | Freq: Every day | ORAL | 11 refills | Status: DC

## 2017-03-19 NOTE — Care Management Note (Signed)
Case Management Note Original Note Created Zenon Mayo, RN 03/13/2017, 12:51 PM  Patient Details  Name: Miranda [name] MRN: [id number] Date of Birth: [birth year]  Subjective/Objective:   From home alone, POD 1 AVR/MV ring, co-ox of 49%, per MD will dc epi, cont milrinone, conts with chest tube, diuresis, for short term coumadin.    8/13 [place], BSN - POD 4 AVR, MVR, co - ox 56 this am, will stop milrinone and recheck co ox at 1 pm.                 Action/Plan: NCM will follow for dc needs.  Expected Discharge Date:  03/19/17               Expected Discharge Plan:  Home/Self Care  In-House Referral:  NA  Discharge planning Services  CM Consult  Post Acute Care Choice:  NA Choice offered to:  NA  DME Arranged:    DME Agency:     HH Arranged:    HH Agency:     Status of Service:  Completed, signed off  If discussed at [employer] of Stay Meetings, dates discussed:    Discharge Disposition: home/self care   Additional Comments:  03/19/17- 1040- Kathlean Cinco RN, CM- pt for d/c home today- no CM needs noted for discharge.  Dahlia Client [place], RN 03/19/2017, 10:40 AM [phone]

## 2017-03-19 NOTE — Progress Notes (Signed)
Verbal order received by Dr. Cyndia Bent to not discontinue chest tube sutures prior to discharge. Pt made aware.   Fritz Pickerel, RN

## 2017-03-19 NOTE — Consult Note (Signed)
           [facility] CM Primary Care Navigator  03/19/2017  Sweta Nedrow 1965/10/02 [id number]   Went to seepatientat the bedside to identify possible discharge needs. Patientreports having "shortness of breath" and chest tightness that had led to this admission. Patient endorses Dr. Hollace Kinnier with [facility] as her primary care provider.   [facility] [address] to [facility] without any difficulty.   Patient reportsmanaging her ownmedications at home using "pill box"system filled weekly.  She reports that her father Gwenlyn Perking),  son Harrell Gave- coming from [place]) or neighbor Katharine Look) will be able to provide transportation to her doctors'appointments after discharge.   Patient lives alone and independent with self care. She does not work and is on disability. She states that son will be here for 2 weeks to assist with her care needs. Patient's father (who lives close by) can also assist with care at home when needed.   Discharge plan is home according to patient.  Patient voiced understanding to call primary care provider's office when she gets home for a post discharge follow-up appointment within a week or sooner if needs arise.Patient letter (with PCP's contact number) was provided as herreminder.  Patient has history of NYHA class III symptoms of acute on chronic systolic and diastolic HF.  She verbalized not being fully awareof the signs and symptoms of HF that will need medical assistance andnot aware of the HF zones/ tool. She reports monitoring and recording daily weights. Patient admits needing furtherassistance in managing her chronic illness at home.  She verbally agreed and opted a referral to [facility] [place] care management for education and further management of HF at home.   Referral made to [facility] telephonic care management for further assistance with disease management of HF.   For questions,  please contact:  Dannielle Huh, BSN, RN- [facility] Primary Care Navigator  Telephone: [phone] [place]

## 2017-03-19 NOTE — Progress Notes (Signed)
Discussed with the patient and all questioned fully answered. She will call me if any problems arise.   IV removed. Telemetry removed. Pt given paper Rx for Tramadol and Warfarin. Pt verbalized understanding of all discharge instructions. Cardiac rehab discharge education completed 8/15. Pt resting comfortably in room waiting for her son who will be taking her home.   Fritz Pickerel, RN

## 2017-03-19 NOTE — Progress Notes (Addendum)
      [address]       [place] [address]             [phone]      7 Days Post-Op Procedure(s) (LRB): AORTIC VALVE REPLACEMENT (AVR) with Mitral Annuloplasty (N/A) MITRAL VALVE REPAIR (MVR) (N/A) TRANSESOPHAGEAL ECHOCARDIOGRAM (TEE) (N/A)   Subjective:  No complaints.  She states she is going home today.  + ambulation independently   + BM  Objective: Vital signs in last 24 hours: Temp:  [98.1 F (36.7 C)-101.2 F (38.4 C)] 98.4 F (36.9 C) (08/16 0526) Pulse Rate:  [84-99] 84 (08/16 0526) Cardiac Rhythm: Normal sinus rhythm;Other (Comment) (08/15 1900) Resp:  [21-31] 21 (08/15 1700) BP: (99-109)/(76-81) 109/77 (08/16 0526) SpO2:  [96 %-99 %] 96 % (08/16 0526) Weight:  [127 lb 12.8 oz (58 kg)] 127 lb 12.8 oz (58 kg) (08/16 0526)  Intake/Output from previous day: 08/15 0701 - 08/16 0700 In: 600 [P.O.:600] Out: -   General appearance: alert, cooperative and no distress Heart: regular rate and rhythm Lungs: clear to auscultation bilaterally Abdomen: soft, non-tender; bowel sounds normal; no masses,  no organomegaly Extremities: edema minimal Wound: clean and dry  Lab Results: No results for input(s): WBC, HGB, HCT, PLT in the last 72 hours. BMET: No results for input(s): NA, K, CL, CO2, GLUCOSE, BUN, CREATININE, CALCIUM in the last 72 hours.  PT/INR:  Recent Labs  03/19/17 0326  LABPROT 21.4*  INR 1.83   ABG    Component Value Date/Time   PHART 7.335 (L) 03/12/2017 1954   HCO3 24.3 03/12/2017 1954   TCO2 26 03/13/2017 1705   ACIDBASEDEF 1.0 03/12/2017 1954   O2SAT 57.4 03/16/2017 1300   CBG (last 3)  No results for input(s): GLUCAP in the last 72 hours.  Assessment/Plan: S/P Procedure(s) (LRB): AORTIC VALVE REPLACEMENT (AVR) with Mitral Annuloplasty (N/A) MITRAL VALVE REPAIR (MVR) (N/A) TRANSESOPHAGEAL ECHOCARDIOGRAM (TEE) (N/A)  1. CV- Sinus Tach, Bp controlled- continue Coreg at 6.25 mg BID, Lisinopril 2. Pulm- no acute  issues, off oxygen 3. INR 1.83, will continue coumadin at 5 mg daily at discharge 4. Renal- remains stable, weight is below baseline, taper lasix off at discharge 5. Dispo- patient stable, continue coumadin at current dose, INR is arranged for Monday, will d/c home today   LOS: 7 days    Ellwood Handler 03/19/2017  Chart reviewed, patient examined, agree with above. She looks good. Home on 5 mg Coumadin and check INR Monday.

## 2017-03-20 ENCOUNTER — Encounter: Payer: Self-pay | Admitting: *Deleted

## 2017-03-23 ENCOUNTER — Telehealth (HOSPITAL_COMMUNITY): Payer: Self-pay

## 2017-03-23 ENCOUNTER — Other Ambulatory Visit: Payer: Self-pay | Admitting: *Deleted

## 2017-03-23 ENCOUNTER — Ambulatory Visit (INDEPENDENT_AMBULATORY_CARE_PROVIDER_SITE_OTHER): Payer: PPO | Admitting: *Deleted

## 2017-03-23 DIAGNOSIS — I351 Nonrheumatic aortic (valve) insufficiency: Secondary | ICD-10-CM

## 2017-03-23 DIAGNOSIS — Z952 Presence of prosthetic heart valve: Secondary | ICD-10-CM

## 2017-03-23 DIAGNOSIS — Z5181 Encounter for therapeutic drug level monitoring: Secondary | ICD-10-CM | POA: Diagnosis not present

## 2017-03-23 LAB — POCT INR: INR: 2

## 2017-03-23 NOTE — Telephone Encounter (Signed)
Patient insurance is active and benefits verified. Patient insurance is [employer] - $15.00 co-payment, no deductible, out of pocket $3400/$265.00 has been met, no co-insurance and no pre-authorization. Spoke with Millie(Mickie) @ [employer] reference [phone].  Patient will be contacted and scheduled after their follow up appointment with the cardiologist on 04/07/17 and surgeon on 04/21/17, upon review by [facility] RN navigator.

## 2017-03-23 NOTE — Patient Outreach (Signed)
[place]) Care Management  03/23/2017  Miranda [name] 03-13-1966 [id number]  Referral via [facility] liaison for HF disease management:  Telephone call to patient; left HIPPA compliant voice mail requesting call back.  Plan: Will follow up.  Sherrin Daisy, RN BSN [place] Management Coordinator [facility] Care Management  [phone]

## 2017-03-23 NOTE — Patient Instructions (Signed)

## 2017-03-23 NOTE — Telephone Encounter (Signed)
This encounter was created in error - please disregard.

## 2017-03-24 ENCOUNTER — Other Ambulatory Visit: Payer: Self-pay | Admitting: *Deleted

## 2017-03-24 NOTE — Patient Outreach (Signed)
[place] [facility]) Care Management  03/24/2017  Miranda [name] [birth year] [id number]  Referral via [place] for disease management. Transition of care calls will be completed by primary care office.  Telephone call attempt  x 2; left HIPPA compliant voice mail requesting call back.  Plan: Will follow up.   Sherrin Daisy, RN BSN [place] Management Coordinator [facility] Care Management  [phone]

## 2017-03-26 ENCOUNTER — Encounter: Payer: Self-pay | Admitting: Nurse Practitioner

## 2017-03-26 ENCOUNTER — Ambulatory Visit (INDEPENDENT_AMBULATORY_CARE_PROVIDER_SITE_OTHER): Payer: Self-pay

## 2017-03-26 ENCOUNTER — Ambulatory Visit (INDEPENDENT_AMBULATORY_CARE_PROVIDER_SITE_OTHER): Payer: PPO | Admitting: Nurse Practitioner

## 2017-03-26 VITALS — BP 128/74 | HR 83 | Temp 98.3°F | Resp 17 | Ht 65.0 in | Wt 131.6 lb

## 2017-03-26 DIAGNOSIS — I5023 Acute on chronic systolic (congestive) heart failure: Secondary | ICD-10-CM | POA: Diagnosis not present

## 2017-03-26 DIAGNOSIS — Z9889 Other specified postprocedural states: Secondary | ICD-10-CM | POA: Diagnosis not present

## 2017-03-26 DIAGNOSIS — Z952 Presence of prosthetic heart valve: Secondary | ICD-10-CM | POA: Diagnosis not present

## 2017-03-26 DIAGNOSIS — I5042 Chronic combined systolic (congestive) and diastolic (congestive) heart failure: Secondary | ICD-10-CM | POA: Diagnosis not present

## 2017-03-26 DIAGNOSIS — K219 Gastro-esophageal reflux disease without esophagitis: Secondary | ICD-10-CM

## 2017-03-26 DIAGNOSIS — D500 Iron deficiency anemia secondary to blood loss (chronic): Secondary | ICD-10-CM | POA: Diagnosis not present

## 2017-03-26 DIAGNOSIS — R509 Fever, unspecified: Secondary | ICD-10-CM

## 2017-03-26 DIAGNOSIS — Z4802 Encounter for removal of sutures: Secondary | ICD-10-CM

## 2017-03-26 LAB — POCT URINALYSIS DIPSTICK
Bilirubin, UA: NEGATIVE
Glucose, UA: NEGATIVE
Ketones, UA: NEGATIVE
Nitrite, UA: NEGATIVE
Spec Grav, UA: 1.015 (ref 1.010–1.025)
Urobilinogen, UA: 0.2 E.U./dL
pH, UA: 6.5 (ref 5.0–8.0)

## 2017-03-26 LAB — CBC WITH DIFFERENTIAL/PLATELET
Basophils Absolute: 0 cells/uL (ref 0–200)
Basophils Relative: 0 %
Eosinophils Absolute: 160 cells/uL (ref 15–500)
Eosinophils Relative: 2 %
HCT: 29.9 % — ABNORMAL LOW (ref 35.0–45.0)
Hemoglobin: 9.4 g/dL — ABNORMAL LOW (ref 11.7–15.5)
Lymphocytes Relative: 22 %
Lymphs Abs: 1760 cells/uL (ref 850–3900)
MCH: 27.4 pg (ref 27.0–33.0)
MCHC: 31.4 g/dL — ABNORMAL LOW (ref 32.0–36.0)
MCV: 87.2 fL (ref 80.0–100.0)
MPV: 8.5 fL (ref 7.5–12.5)
Monocytes Absolute: 640 cells/uL (ref 200–950)
Monocytes Relative: 8 %
Neutro Abs: 5440 cells/uL (ref 1500–7800)
Neutrophils Relative %: 68 %
Platelets: 482 10*3/uL — ABNORMAL HIGH (ref 140–400)
RBC: 3.43 MIL/uL — ABNORMAL LOW (ref 3.80–5.10)
RDW: 15.4 % — ABNORMAL HIGH (ref 11.0–15.0)
WBC: 8 10*3/uL (ref 3.8–10.8)

## 2017-03-26 LAB — BASIC METABOLIC PANEL WITH GFR
BUN: 14 mg/dL (ref 7–25)
CO2: 26 mmol/L (ref 20–32)
Calcium: 9 mg/dL (ref 8.6–10.4)
Chloride: 103 mmol/L (ref 98–110)
Creat: 0.67 mg/dL (ref 0.50–1.05)
GFR, Est African American: >89 mL/min (ref >=60)
GFR, Est Non African American: >89 mL/min (ref >=60)
Glucose, Bld: 81 mg/dL (ref 65–99)
Potassium: 4.2 mmol/L (ref 3.5–5.3)
Sodium: 138 mmol/L (ref 135–146)

## 2017-03-26 MED ORDER — LISINOPRIL 10 MG PO TABS: 10.0000 mg | ORAL_TABLET | Freq: Every day | ORAL | 1 refills | Status: DC

## 2017-03-26 MED ORDER — LEVOTHYROXINE SODIUM 112 MCG PO TABS: ORAL_TABLET | ORAL | 4 refills | Status: DC

## 2017-03-26 MED ORDER — FERROUS FUMARATE 324 (106 FE) MG PO TABS: 1.0000 | ORAL_TABLET | Freq: Two times a day (BID) | ORAL | 3 refills | Status: DC

## 2017-03-26 MED ORDER — FLUTICASONE PROPIONATE 50 MCG/ACT NA SUSP: 1.0000 | Freq: Every day | NASAL | 2 refills | Status: DC | PRN

## 2017-03-26 MED ORDER — CARVEDILOL 6.25 MG PO TABS: 6.2500 mg | ORAL_TABLET | Freq: Two times a day (BID) | ORAL | 1 refills | Status: DC

## 2017-03-26 MED ORDER — TRAZODONE HCL 150 MG PO TABS: 150.0000 mg | ORAL_TABLET | Freq: Every day | ORAL | 1 refills | Status: DC

## 2017-03-26 MED ORDER — POTASSIUM CHLORIDE CRYS ER 20 MEQ PO TBCR: 20.0000 meq | EXTENDED_RELEASE_TABLET | Freq: Every day | ORAL | 1 refills | Status: DC

## 2017-03-26 MED ORDER — ATORVASTATIN CALCIUM 20 MG PO TABS: ORAL_TABLET | ORAL | 3 refills | Status: DC

## 2017-03-26 MED ORDER — FUROSEMIDE 40 MG PO TABS
40.0000 mg | ORAL_TABLET | Freq: Every day | ORAL | 2 refills | Status: DC
Start: 2017-03-26 — End: 2017-05-07

## 2017-03-26 MED ORDER — MIRTAZAPINE 7.5 MG PO TABS: 7.5000 mg | ORAL_TABLET | Freq: Every day | ORAL | 3 refills | Status: DC

## 2017-03-26 MED ORDER — BUPROPION HCL ER (XL) 150 MG PO TB24: 150.0000 mg | ORAL_TABLET | Freq: Every day | ORAL | 5 refills | Status: DC

## 2017-03-26 NOTE — Progress Notes (Signed)
Careteam: Patient Care Team: Miranda Curry, DO as PCP - General (Geriatric Medicine) Emily Filbert, MD as Consulting Physician (Obstetrics and Gynecology) Haroldine Laws, Shaune Pascal, MD as Consulting Physician (Cardiology) Penni Bombard, MD as Consulting Physician (Neurology) Dickie La, RN as [place] Management  Advanced Directive information Does Patient Have a Medical Advance Directive?: No  Allergies  Allergen Reactions  . Codeine Hives    Chief Complaint  Patient presents with  . Hospitalization Follow-up    Pt is being seen to follow up on recent hospital stay at [facility] 03/12/17 to 03/19/17 due to having mitral valve repair.   Miranda [name]    Son, Miranda [name], in room     HPI: Patient is a 51 y.o. female seen in the office today for hospital follow up. Pt with hx of hypertension, hypothyroidism, depression on meds and a history of non-ischemic cardiomyopathy dating back to 2014, congestive heart failure and an echo 10/27/2016 showed an EF of 15-20% with diffuse hypokinesis, mild MR and no AS or AI. She had a cath at that time that showed no significant coronary disease. She was treated medically and a follow up echo in 01/2013 showed some improvement in the LVEF to 35-40%. Her next echo in 10/2013 showed further improvement in the EF to 50% with no AS or AI. There was mild aortic root dilation to 37 mm. She was seen in 04/2016 and was doing well on an ACE I and BB with no diuretic. She had a follow up echo on 5/9 that showed new severe central AI with an drop in her LVEF to 30-35% with diffuse hypokinesis and grade 3 diastolic dysfunction.   On 03/12/2017 [name] underwent an Aortic valve replacement and MV repair with Dr. Cyndia Bent. She tolerated the procedure well. She was started iron for anemia post op. She was continued her diuretic regimen for fluid overload. She was placed on coumadin for MV repair thrombosis prophylaxis.  She was be discharged home on 5 mg  coumadin daily. Pt is following with anticoagulation clinic to follow INR.   Ms Miranda [name] reports she is doing okay today. Breathing has been good. No shortness of breath.  Reports she remains sore/pain in her chest. Been off and on but unsure the duration, states it may have started before she left the hospital but she does not know. Yesterday is when she really started talking about it because it lasted a few hours. Not current having the pain/discomfort.  No associated shortness of breath, did not radiate Reports when she touched the area it was painful but also reports it happened after she ate and felt like it was most like GERD. Has had heartburn in the past.   Son reports she had a fever of 101 2 days ago. Stated it only lasted a few hours. Reports she felt warm so she took her temperature. Took tylenol once and then felt like it resolved and never rechecked- also lost her thermometer  Pt states she declined home health services. Son is staying with her to help her out.   Reports she has a hx of headaches but had not had one in a while. Now that she is on coumadin the headache has come back. Has taken tylenol for this which has helped at times.   Review of Systems:  Review of Systems  Constitutional: Positive for malaise/fatigue. Negative for chills and fever.  HENT: Negative for congestion and hearing loss.   Eyes: Negative  for blurred vision and double vision.       Glasses, some chronic diplopia and nystagmus   Respiratory: Negative for cough, sputum production, shortness of breath and wheezing.   Cardiovascular: Positive for chest pain. Negative for palpitations, orthopnea, claudication, leg swelling and PND.  Gastrointestinal: Positive for heartburn. Negative for abdominal pain, blood in stool, constipation and melena.  Genitourinary: Negative for dysuria, frequency and urgency.  Musculoskeletal: Negative for falls and myalgias.  Neurological: Positive for headaches. Negative for  dizziness and loss of consciousness.  Endo/Heme/Allergies: Does not bruise/bleed easily.    Past Medical History:  Diagnosis Date  . Anemia   . Anxiety   . At risk for sudden cardiac death 11-05-12  . CHF - Combined Systolic + Diastolic Dysfunction. EF of 15-20% with Grade II diastolic dysfunction on echo 10/27/12 10/28/2012  . Coronary artery disease   . Depression   . Dyspnea   . Fibroids   . Heart murmur   . Hypertension   . Hypothyroidism   . S/P cardiac catheterization, 2012-11-05, normal coronaries with minimal luminal irregularities in RCA system 10/30/2012  . Thyroid disease    hypothyroidism   Past Surgical History:  Procedure Laterality Date  . AORTIC VALVE REPLACEMENT N/A 03/12/2017   Procedure: AORTIC VALVE REPLACEMENT (AVR) with Mitral Annuloplasty;  Surgeon: Gaye Pollack, MD;  Location: [facility] OR;  Service: Open Heart Surgery;  Laterality: N/A;  . LEFT AND RIGHT HEART CATHETERIZATION WITH CORONARY ANGIOGRAM N/A Nov 05, 2012   Procedure: LEFT AND RIGHT HEART CATHETERIZATION WITH CORONARY ANGIOGRAM;  Surgeon: Leonie Man, MD;  Location: [facility] CATH LAB;  Service: Cardiovascular;  Laterality: N/A;  . MITRAL VALVE REPAIR N/A 03/12/2017   Procedure: MITRAL VALVE REPAIR (MVR);  Surgeon: Gaye Pollack, MD;  Location: [facility];  Service: Open Heart Surgery;  Laterality: N/A;  mitral annuloplasty  . MULTIPLE EXTRACTIONS WITH ALVEOLOPLASTY N/A 01/21/2017   Procedure: Extraction of tooth #'s 4-13 with alveoloplasty;  Surgeon: Lenn Cal, DDS;  Location: [place];  Service: Oral Surgery;  Laterality: N/A;  . RIGHT/LEFT HEART CATH AND CORONARY ANGIOGRAPHY N/A 01/09/2017   Procedure: Right/Left Heart Cath and Coronary Angiography;  Surgeon: Troy Sine, MD;  Location: [facility] CV LAB;  Service: Cardiovascular;  Laterality: N/A;  . TEE WITHOUT CARDIOVERSION N/A 03/12/2017   Procedure: TRANSESOPHAGEAL ECHOCARDIOGRAM (TEE);  Surgeon: Gaye Pollack, MD;  Location: [facility];  Service: Open Heart  Surgery;  Laterality: N/A;   Social History:   reports that she quit smoking about 38 years ago. Her smoking use included Cigars and Cigarettes. She has a 2.50 pack-year smoking history. She has never used smokeless tobacco. She reports that she drinks about 1.8 oz of alcohol per week . She reports that she uses drugs, including Marijuana, about 5 times per week.  Family History  Problem Relation Age of Onset  . Cancer Paternal Grandfather        Bone  . Hypertension Mother   . Autoimmune disease Mother   . Stroke Father   . Hypertension Father   . Hypertension Maternal Grandmother   . Breast cancer Cousin   . Cancer Maternal Uncle        bone  . Arthritis Paternal Uncle     Medications: Patient's Medications  New Prescriptions   No medications on file  Previous Medications   ACETAMINOPHEN (TYLENOL) 500 MG TABLET    Take 500 mg by mouth daily as needed for headache (pain).    ASPIRIN EC  81 MG EC TABLET    Take 1 tablet (81 mg total) by mouth daily.   ATORVASTATIN (LIPITOR) 20 MG TABLET    TAKE ONE TABLET BY MOUTH ONCE DAILY AT 6 PM   BUPROPION (WELLBUTRIN XL) 150 MG 24 HR TABLET    TAKE ONE TABLET BY MOUTH ONCE DAILY   CARVEDILOL (COREG) 6.25 MG TABLET    TAKE ONE TABLET BY MOUTH TWICE DAILY   FERROUS FUMARATE (HEMOCYTE - 106 MG FE) 324 (106 FE) MG TABS TABLET    Take 1 tablet (106 mg of iron total) by mouth 2 (two) times daily.   FLUTICASONE (FLONASE) 50 MCG/ACT NASAL SPRAY    Place 1 spray into both nostrils daily as needed for allergies or rhinitis.   FUROSEMIDE (LASIX) 40 MG TABLET    Take 40 mg by mouth daily. Monday and thurday take '60mg'$    LEVOTHYROXINE (SYNTHROID, LEVOTHROID) 112 MCG TABLET    TAKE ONE TABLET BY MOUTH ONCE DAILY BEFORE BREAKFAST   LISINOPRIL (PRINIVIL,ZESTRIL) 10 MG TABLET    Take 1 tablet (10 mg total) by mouth daily.   MIRTAZAPINE (REMERON) 7.5 MG TABLET    Take 1 tablet (7.5 mg total) by mouth at bedtime.   POTASSIUM CHLORIDE SA (K-DUR,KLOR-CON) 20 MEQ  TABLET    Take 1 tablet (20 mEq total) by mouth daily.   TRAMADOL (ULTRAM) 50 MG TABLET    Take 1-2 tablets (50-100 mg total) by mouth every 4 (four) hours as needed for moderate pain.   TRAZODONE (DESYREL) 150 MG TABLET    Take 1 tablet (150 mg total) by mouth at bedtime.   WARFARIN (COUMADIN) 5 MG TABLET    Take 1 tablet (5 mg total) by mouth daily.  Modified Medications   No medications on file  Discontinued Medications   No medications on file     Physical Exam:  Vitals:   03/26/17 0916  BP: 128/74  Pulse: 83  Resp: 17  Temp: 98.3 F (36.8 C)  TempSrc: Oral  SpO2: 96%  Weight: 131 lb 9.6 oz (59.7 kg)  Height: '5\' 5"'$  (1.651 m)   Body mass index is 21.9 kg/m.  Physical Exam  Constitutional: She is oriented to person, place, and time. No distress.  HENT:  Head: Normocephalic and atraumatic.  Edentulous  Eyes: Pupils are equal, round, and reactive to light.  Neck: Neck supple. No JVD present.  Cardiovascular: Normal rate, regular rhythm, normal heart sounds and intact distal pulses.   Pulmonary/Chest: Effort normal and breath sounds normal. No respiratory distress.  Abdominal: Soft. Bowel sounds are normal.  Musculoskeletal: Normal range of motion.  Neurological: She is alert and oriented to person, place, and time.  Skin: Skin is warm and dry. Capillary refill takes less than 2 seconds.  Well healing sternal incision  3 incision with sutures to upper abd- without drainage   Psychiatric:  Flat affect    Labs reviewed: Basic Metabolic Panel:  Recent Labs  09/26/16 1019  03/12/17 1857  03/13/17 0306 03/13/17 1652  03/14/17 0352 03/15/17 0351 03/16/17 0519  NA 140  < >  --   < > 136  --   < > 132* 134* 136  K 3.8  < >  --   < > 5.2*  --   < > 4.2 3.7 4.2  CL 108  < >  --   < > 107  --   < > 97* 99* 102  CO2 21  < >  --   --  23  --   --  '28 28 27  '$ GLUCOSE 79  < >  --   < > 124*  --   < > 116* 106* 95  BUN 10  < >  --   < > 11  --   < > '12 9 10  '$ CREATININE  0.86  < > 0.68  < > 0.65 0.66  < > 0.61 0.61 0.61  CALCIUM 8.9  < >  --   --  8.3*  --   --  8.5* 8.2* 8.6*  MG  --   --  2.9*  --  2.3 2.0  --   --   --   --   TSH 3.62  --   --   --   --   --   --   --   --   --   < > = values in this interval not displayed. Liver Function Tests:  Recent Labs  01/14/17 1005 03/10/17 1033  AST 55* 76*  ALT 30 51  ALKPHOS 101 86  BILITOT 0.4 0.4  PROT 7.5 8.2*  ALBUMIN 3.0* 3.4*   No results for input(s): LIPASE, AMYLASE in the last 8760 hours. No results for input(s): AMMONIA in the last 8760 hours. CBC:  Recent Labs  09/26/16 1019 11/27/16 0935  03/14/17 1713 03/15/17 0351 03/16/17 0519  WBC 5.6 6.1  < > 11.4* 10.6* 8.2  NEUTROABS 3,360 3.7  --   --   --   --   HGB 11.9 11.0*  < > 8.5* 8.1* 9.6*  HCT 37.6 36.8  < > 26.2* 25.6* 29.6*  MCV 87.2 94  < > 83.4 83.7 84.6  PLT 218 232  < > 100* 105* 150  < > = values in this interval not displayed. Lipid Panel:  Recent Labs  09/26/16 1019  CHOL 163  HDL 60  LDLCALC 92  TRIG 54  CHOLHDL 2.7   TSH:  Recent Labs  09/26/16 1019  TSH 3.62   A1C: Lab Results  Component Value Date   HGBA1C 6.1 (H) 03/10/2017     Assessment/Plan 1. Chronic combined systolic and diastolic congestive heart failure ([place]) euvolemic today. conts on lasix with potassium supplement, lisinopril and coreg as prescribed - carvedilol (COREG) 6.25 MG tablet; Take 1 tablet (6.25 mg total) by mouth 2 (two) times daily.  Dispense: 180 tablet; Refill: 1 - lisinopril (PRINIVIL,ZESTRIL) 10 MG tablet; Take 1 tablet (10 mg total) by mouth daily.  Dispense: 90 tablet; Refill: 1  2. Iron deficiency anemia due to chronic blood loss -will follow up CBC , conts on iron - Ferrous Fumarate (HEMOCYTE - 106 MG FE) 324 (106 Fe) MG TABS tablet; Take 1 tablet (106 mg of iron total) by mouth 2 (two) times daily.  Dispense: 180 tablet; Refill: 3   3. Fever, unspecified fever cause -spiked a temp 2 days ago however has been  on tylenol occasionally and lost thermometer so unsure if she is still having fevers. Reports overall fatigue but this is improving post op.  - CBC with Differential/Platelets - POC Urinalysis Dipstick with leukocytes so will sent for culture - Culture, Urine; Future - to notify if fever reoccurs  4. Gastroesophageal reflux disease without esophagitis -encouraged food modifications to reduce reoccurrence/worsening of  symptoms.  -to notify if symptoms persist   5. Aortic valve replaced & S/P mitral valve repair Home with son at this time. Reports weakness and fatigue but is improving. Has follow  up with clinic to have sutures removed from chest tube sites.  -following with coumadin clinic for INR check.  -has follow up scheduled with cardiologist and cardiac thoracic surgeron -to cont deep breathing exercises  -declined HH services to help with strength.   Next appt: 6 weeks for Dr Mariea Clonts as scheduled.  Carlos American. Harle Battiest  [facility] [phone] 8 am - 5 pm) [phone] (after hours)

## 2017-03-26 NOTE — Progress Notes (Signed)
3 sutures from chest tube sites with no signs of infection and patient tolerated well. Patient had no complaints or questions at this time.

## 2017-03-26 NOTE — Patient Instructions (Signed)
Cont to do deep breathing exercises 3-4 times a day  Notify if fever reoccurs   Keep appts as scheduled   Food Choices for Gastroesophageal Reflux Disease, Adult When you have gastroesophageal reflux disease (GERD), the foods you eat and your eating habits are very important. Choosing the right foods can help ease your discomfort. What guidelines do I need to follow?  Choose fruits, vegetables, whole grains, and low-fat dairy products.  Choose low-fat meat, fish, and poultry.  Limit fats such as oils, salad dressings, butter, nuts, and avocado.  Keep a food diary. This helps you identify foods that cause symptoms.  Avoid foods that cause symptoms. These may be different for everyone.  Eat small meals often instead of 3 large meals a day.  Eat your meals slowly, in a place where you are relaxed.  Limit fried foods.  Cook foods using methods other than frying.  Avoid drinking alcohol.  Avoid drinking large amounts of liquids with your meals.  Avoid bending over or lying down until 2-3 hours after eating. What foods are not recommended? These are some foods and drinks that may make your symptoms worse: Vegetables Tomatoes. Tomato juice. Tomato and spaghetti sauce. Chili peppers. Onion and garlic. Horseradish. Fruits Oranges, grapefruit, and lemon (fruit and juice). Meats High-fat meats, fish, and poultry. This includes hot dogs, ribs, ham, sausage, salami, and bacon. Dairy Whole milk and chocolate milk. Sour cream. Cream. Butter. Ice cream. Cream cheese. Drinks Coffee and tea. Bubbly (carbonated) drinks or energy drinks. Condiments Hot sauce. Barbecue sauce. Sweets/Desserts Chocolate and cocoa. Donuts. Peppermint and spearmint. Fats and Oils High-fat foods. This includes [place] fries and potato chips. Other Vinegar. Strong spices. This includes black pepper, white pepper, red pepper, cayenne, curry powder, cloves, ginger, and chili powder. The items listed above may  not be a complete list of foods and drinks to avoid. Contact your dietitian for more information. This information is not intended to replace advice given to you by your health care provider. Make sure you discuss any questions you have with your health care provider. Document Released: 01/20/2012 Document Revised: 12/27/2015 Document Reviewed: 05/25/2013 Elsevier Interactive Patient Education  2017 [employer].

## 2017-03-27 ENCOUNTER — Encounter: Payer: Self-pay | Admitting: *Deleted

## 2017-03-27 ENCOUNTER — Other Ambulatory Visit: Payer: Self-pay | Admitting: *Deleted

## 2017-03-27 ENCOUNTER — Telehealth: Payer: Self-pay

## 2017-03-27 NOTE — Telephone Encounter (Signed)
-----   Message from Gayland Curry, DO sent at 03/27/2017 11:14 AM EDT ----- I see that she does not have a white count with her fevers she had.  She probably has atelectasis.  Did they send her home with an incentive spirometer?  Let's call and check if she has one and advise she take 10 breaths on it 3 times daily to prevent pneumonia.   Tiffany L. Reed, D.O. [facility] [address], [place] [address] Cell Phone (Mon-Fri 8am-5pm):  [phone] On Call:  [phone] & follow prompts after 5pm & weekends Office Phone:  [phone] Office Fax:  [phone]  ----- Message ----- From: Lauree Chandler, NP Sent: 03/26/2017   1:18 PM To: Gayland Curry, DO

## 2017-03-27 NOTE — Patient Outreach (Signed)
[facility] [facility]) Care Management  03/27/2017  Sharonne Ricketts 01/22/1966 [id number]  Telephone call attempt x 3; left HIPPA compliant voice mail requesting call back.  Plan: Send out reach letter to patient. Follow up in 10 business days.Sherrin Daisy, RN BSN [place] Management Coordinator [facility] Care Management  [phone]

## 2017-03-27 NOTE — Telephone Encounter (Signed)
Left a message for patient to call the office.

## 2017-03-28 LAB — URINE CULTURE

## 2017-03-30 ENCOUNTER — Ambulatory Visit (INDEPENDENT_AMBULATORY_CARE_PROVIDER_SITE_OTHER): Payer: PPO | Admitting: *Deleted

## 2017-03-30 DIAGNOSIS — Z5181 Encounter for therapeutic drug level monitoring: Secondary | ICD-10-CM

## 2017-03-30 DIAGNOSIS — Z952 Presence of prosthetic heart valve: Secondary | ICD-10-CM

## 2017-03-30 DIAGNOSIS — I351 Nonrheumatic aortic (valve) insufficiency: Secondary | ICD-10-CM

## 2017-03-30 LAB — POCT INR: INR: 2.5

## 2017-04-03 NOTE — Addendum Note (Signed)
Addended by: Lauree Chandler on: 04/03/2017 10:37 AM   Modules accepted: Level of Service

## 2017-04-07 ENCOUNTER — Ambulatory Visit (INDEPENDENT_AMBULATORY_CARE_PROVIDER_SITE_OTHER): Payer: PPO | Admitting: *Deleted

## 2017-04-07 ENCOUNTER — Encounter: Payer: Self-pay | Admitting: Cardiology

## 2017-04-07 ENCOUNTER — Ambulatory Visit (INDEPENDENT_AMBULATORY_CARE_PROVIDER_SITE_OTHER): Payer: PPO | Admitting: Cardiology

## 2017-04-07 VITALS — BP 122/90 | HR 82 | Ht 65.0 in | Wt 127.1 lb

## 2017-04-07 DIAGNOSIS — Z9889 Other specified postprocedural states: Secondary | ICD-10-CM | POA: Diagnosis not present

## 2017-04-07 DIAGNOSIS — D508 Other iron deficiency anemias: Secondary | ICD-10-CM | POA: Diagnosis not present

## 2017-04-07 DIAGNOSIS — I2510 Atherosclerotic heart disease of native coronary artery without angina pectoris: Secondary | ICD-10-CM | POA: Diagnosis not present

## 2017-04-07 DIAGNOSIS — E782 Mixed hyperlipidemia: Secondary | ICD-10-CM

## 2017-04-07 DIAGNOSIS — I351 Nonrheumatic aortic (valve) insufficiency: Secondary | ICD-10-CM

## 2017-04-07 DIAGNOSIS — I428 Other cardiomyopathies: Secondary | ICD-10-CM

## 2017-04-07 DIAGNOSIS — Z5181 Encounter for therapeutic drug level monitoring: Secondary | ICD-10-CM | POA: Diagnosis not present

## 2017-04-07 DIAGNOSIS — Z952 Presence of prosthetic heart valve: Secondary | ICD-10-CM

## 2017-04-07 LAB — POCT INR: INR: 2.3

## 2017-04-07 NOTE — Progress Notes (Signed)
Cardiology Office Note   Date:  04/07/2017   ID:  Miranda [name], DOB 1966/07/30, MRN [id number]  PCP:  Gayland Curry, DO  Cardiologist:  Dr. Marlou Porch     Chief Complaint  Patient presents with  . Hospitalization Follow-up      History of Present Illness: Vanassa Clarkston is a 51 y.o. female who presents for post hospital with discharge 03/19/17  After  Aortic valve replacement 03/12/17 with 23 mm Edwards Magna-Ease pericardial valve and MV annuloplasty with 28 mm Sorin 3D MEMO ring.     She has a hx of chronic systolic HF, NICM, with previuos EF 15%.  In March 2015 EF was 50%.  On last visit inSept 2017 she was doing well on ACE and BB- and no longer on lasix.  Her EKG is chronically abnormal with T wave inversion in several leads.     (Pt had been seen in [place] for acute CHF and treated, but echo with severe  Aortic regurgitation and decrease in EF to 30-35%.  This lead to TCTS consult and Rt and Lt cardiac cath.  Cath with Ost RPDA to RPDA lesion 50% stenosed, severe LV dysfunction mod pulmonary hypertension, and mild 2_ MR.  EF was 15%.  PA pk pressure was 60 mmHg. Severe aortic insuff. Mildly dilated aortic root.   Mild coronary obstructive disease with a normal LAD, normal small intermediate vessel, normal left circumflex, and a large dominant RCA with smooth 50% ostial PDA narrowing.)  Prior to her surgery she was seen by [facility] and had multiple teeth extractions.  Now completely edentulous.   Carotid dopplers 1-39% bilateral internal carotid stenosis.    Echo TEE in OR Left ventricle: Cavity is moderately dilated. LV systolic function is severely reduced with an EF of 25-30%. No thrombus present. No mass present.  Septum: No Patent Foramen Ovale present.  Left atrium: Patent foramen ovale not present.  Left atrium: Cavity is dilated and dilated.  Aortic valve: The valve is trileaflet. Mild valve thickening present. No stenosis. Moderate to severe regurgitation. There is flow  reversal in the descending aorta.  Mitral valve: Non-specific thickening. Moderate regurgitation.  Right ventricle: Cavity is moderately dilated. Moderately reduced systolic function.  Right atrium: Cavity is dilated.  Tricuspid valve: Mild regurgitation. The tricuspid valve regurgitation jet is directed toward the right atrial free wall.  Pulmonic valve: Trace regurgitation.  Right ventricle: Normal cavity size. Mildly reduced systolic function.   Today she has no SOB no edema.  Occ rapid HR.but brief.  No chest pain.  occ lightheadedness.      SBE prophyasis - to notify MDs prior to any surgery.  Past Medical History:  Diagnosis Date  . Anemia   . Anxiety   . At risk for sudden cardiac death 2012-11-16  . CHF - Combined Systolic + Diastolic Dysfunction. EF of 15-20% with Grade II diastolic dysfunction on echo 10/27/12 10/28/2012  . Coronary artery disease   . Depression   . Dyspnea   . Fibroids   . Heart murmur   . Hypertension   . Hypothyroidism   . S/P cardiac catheterization, 2012-11-16, normal coronaries with minimal luminal irregularities in RCA system 10/30/2012  . Thyroid disease    hypothyroidism    Past Surgical History:  Procedure Laterality Date  . AORTIC VALVE REPLACEMENT N/A 03/12/2017   Procedure: AORTIC VALVE REPLACEMENT (AVR) with Mitral Annuloplasty;  Surgeon: Gaye Pollack, MD;  Location: [facility] OR;  Service: Open Heart Surgery;  Laterality: N/A;  .  LEFT AND RIGHT HEART CATHETERIZATION WITH CORONARY ANGIOGRAM N/A 10/29/2012   Procedure: LEFT AND RIGHT HEART CATHETERIZATION WITH CORONARY ANGIOGRAM;  Surgeon: Leonie Man, MD;  Location: [facility] CATH LAB;  Service: Cardiovascular;  Laterality: N/A;  . MITRAL VALVE REPAIR N/A 03/12/2017   Procedure: MITRAL VALVE REPAIR (MVR);  Surgeon: Gaye Pollack, MD;  Location: [facility];  Service: Open Heart Surgery;  Laterality: N/A;  mitral annuloplasty  . MULTIPLE EXTRACTIONS WITH ALVEOLOPLASTY N/A 01/21/2017   Procedure: Extraction  of tooth #'s 4-13 with alveoloplasty;  Surgeon: Lenn Cal, DDS;  Location: [facility];  Service: Oral Surgery;  Laterality: N/A;  . RIGHT/LEFT HEART CATH AND CORONARY ANGIOGRAPHY N/A 01/09/2017   Procedure: Right/Left Heart Cath and Coronary Angiography;  Surgeon: Troy Sine, MD;  Location: [facility] CV LAB;  Service: Cardiovascular;  Laterality: N/A;  . TEE WITHOUT CARDIOVERSION N/A 03/12/2017   Procedure: TRANSESOPHAGEAL ECHOCARDIOGRAM (TEE);  Surgeon: Gaye Pollack, MD;  Location: [facility];  Service: Open Heart Surgery;  Laterality: N/A;     Current Outpatient Prescriptions  Medication Sig Dispense Refill  . acetaminophen (TYLENOL) 500 MG tablet Take 500 mg by mouth daily as needed for headache (pain).     Marland Kitchen aspirin EC 81 MG EC tablet Take 1 tablet (81 mg total) by mouth daily.    Marland Kitchen atorvastatin (LIPITOR) 20 MG tablet TAKE ONE TABLET BY MOUTH ONCE DAILY AT 6 PM 90 tablet 3  . buPROPion (WELLBUTRIN XL) 150 MG 24 hr tablet Take 1 tablet (150 mg total) by mouth daily. 30 tablet 5  . carvedilol (COREG) 6.25 MG tablet Take 1 tablet (6.25 mg total) by mouth 2 (two) times daily. 180 tablet 1  . Ferrous Fumarate (HEMOCYTE - 106 MG FE) 324 (106 Fe) MG TABS tablet Take 1 tablet (106 mg of iron total) by mouth 2 (two) times daily. 180 tablet 3  . fluticasone (FLONASE) 50 MCG/ACT nasal spray Place 1 spray into both nostrils daily as needed for allergies or rhinitis. 16 g 2  . furosemide (LASIX) 40 MG tablet Take 1 tablet (40 mg total) by mouth daily. Monday and thurday take 60mg  30 tablet 2  . levothyroxine (SYNTHROID, LEVOTHROID) 112 MCG tablet TAKE ONE TABLET BY MOUTH ONCE DAILY BEFORE BREAKFAST 30 tablet 4  . lisinopril (PRINIVIL,ZESTRIL) 10 MG tablet Take 1 tablet (10 mg total) by mouth daily. 90 tablet 1  . mirtazapine (REMERON) 7.5 MG tablet Take 1 tablet (7.5 mg total) by mouth at bedtime. 90 tablet 3  . potassium chloride SA (K-DUR,KLOR-CON) 20 MEQ tablet Take 1 tablet (20 mEq total) by  mouth daily. 90 tablet 1  . traMADol (ULTRAM) 50 MG tablet Take 1-2 tablets (50-100 mg total) by mouth every 4 (four) hours as needed for moderate pain. 30 tablet 0  . traZODone (DESYREL) 150 MG tablet Take 1 tablet (150 mg total) by mouth at bedtime. 90 tablet 1  . warfarin (COUMADIN) 5 MG tablet Take 1 tablet (5 mg total) by mouth daily. 30 tablet 11   No current facility-administered medications for this visit.     Allergies:   Codeine    Social History:  The patient  reports that she quit smoking about 38 years ago. Her smoking use included Cigars and Cigarettes. She has a 2.50 pack-year smoking history. She has never used smokeless tobacco. She reports that she drinks about 1.8 oz of alcohol per week . She reports that she uses drugs, including Marijuana, about 5 times per  week.   Family History:  The patient's family history includes Arthritis in her paternal uncle; Autoimmune disease in her mother; Breast cancer in her cousin; Cancer in her maternal uncle and paternal grandfather; Hypertension in her father, maternal grandmother, and mother; Stroke in her father.    ROS:  General:no colds or fevers, no weight changes Skin:no rashes or ulcers HEENT:no blurred vision, no congestion CV:see HPI PUL:see HPI GI:no diarrhea constipation or melena, no indigestion GU:no hematuria, no dysuria MS:no joint pain, no claudication Neuro:no syncope, no lightheadedness Endo:no diabetes, + thyroid disease  Wt Readings from Last 3 Encounters:  04/07/17 127 lb 1.9 oz (57.7 kg)  03/26/17 131 lb 9.6 oz (59.7 kg)  03/19/17 127 lb 12.8 oz (58 kg)     PHYSICAL EXAM: VS:  BP 122/90   Pulse 82   Ht 5\' 5"  (1.651 m)   Wt 127 lb 1.9 oz (57.7 kg)   LMP 01/17/2015   SpO2 99%   BMI 21.15 kg/m  , BMI Body mass index is 21.15 kg/m. General:Pleasant affect, NAD Skin:Warm and dry, brisk capillary refill HEENT:normocephalic, sclera clear, mucus membranes moist Neck:supple, no JVD, no bruits    Heart:S1S2 RRR without murmur, gallup, rub or click, chest wall incision healing well, no drainage.  Lungs:clear without rales, rhonchi, or wheezes ZOX:WRUE, non tender, + BS, do not palpate liver spleen or masses Ext:no lower ext edema, 2+ pedal pulses, 2+ radial pulses Neuro:alert and oriented X 3, MAE, follows commands, + facial symmetry    EKG:  EKG is NOT ordered today.   Recent Labs: 09/26/2016: TSH 3.62 11/27/2016: NT-Pro BNP 5,337 03/10/2017: ALT 51 03/13/2017: Magnesium 2.0 03/26/2017: BUN 14; Creat 0.67; Hemoglobin 9.4; Platelets 482; Potassium 4.2; Sodium 138    Lipid Panel    Component Value Date/Time   CHOL 163 09/26/2016 1019   CHOL 169 01/25/2016 1019   TRIG 54 09/26/2016 1019   HDL 60 09/26/2016 1019   HDL 49 01/25/2016 1019   CHOLHDL 2.7 09/26/2016 1019   VLDL 11 09/26/2016 1019   LDLCALC 92 09/26/2016 1019   LDLCALC 89 01/25/2016 1019       Other studies Reviewed: Additional studies/ records that were reviewed today include:. Echo TEE 03/12/17 Result status: Final result   Left ventricle: Cavity is moderately dilated. LV systolic function is severely reduced with an EF of 25-30%. No thrombus present. No mass present.  Septum: No Patent Foramen Ovale present.  Left atrium: Patent foramen ovale not present.  Left atrium: Cavity is dilated and dilated.  Aortic valve: The valve is trileaflet. Mild valve thickening present. No stenosis. Moderate to severe regurgitation. There is flow reversal in the descending aorta.  Mitral valve: Non-specific thickening. Moderate regurgitation.  Right ventricle: Cavity is moderately dilated. Moderately reduced systolic function.  Right atrium: Cavity is dilated.  Tricuspid valve: Mild regurgitation. The tricuspid valve regurgitation jet is directed toward the right atrial free wall.  Pulmonic valve: Trace regurgitation.  Right ventricle: Normal cavity size. Mildly reduced systolic function.      Preoperative  Diagnosis:  Severe aortic insufficiency, moderate mitral regurgitation, severe LV systolic dysfunction.   Postoperative Diagnosis:  Same   Procedure:  1. Median Sternotomy 2. Extracorporeal circulation 3.   Aortic valve replacement using a 23 mm Edwards Magna-Ease pericardial valve. 4.   Mitral valve annuloplasty using a 28 mm Sorin 3D MEMO ring.   Procedures   Right/Left Heart Cath and Coronary Angiography  Conclusion     Ost RPDA to RPDA  lesion, 50 %stenosed.  There is severe left ventricular systolic dysfunction.  Hemodynamic findings consistent with moderate pulmonary hypertension.  There is mild (2+) mitral regurgitation.   Dilated cardiomyopathy with severe global LV dysfunction and an ejection fraction of 15%.  There appears to be at least mild to moderate mitral regurgitation.  Moderate elevation of right heart pressures with moderate pulmonary hypertension with a PA systolic pressure of 60 mmHg.  Mildly dilated aortic root with  moderately severe aortic insufficiency.  Mild coronary obstructive disease with a normal LAD, normal small intermediate vessel, normal left circumflex, and a large dominant RCA with smooth 50% ostial PDA narrowing.  RECOMMENDATION: The patient will undergo follow-up outpatient evaluation with Dr. Cyndia Bent.      ASSESSMENT AND PLAN:  1.  Severe aortic insuff.  With AVR with  using a 23 mm Edwards Magna-Ease pericardial valve. Doing well without SOB or edema.  Follow up with Dr. Marlou Porch in 6-8 weeks.would recheck echo at that time.  2.  Mitral regurgitation with MV annuloplasty using a 28 mm Sorin 3D MEMO ring.  Stable,  Does not wish to go to cardiac rehab.    3.   Cardiomyopathy, nonischemic on BB and ACE BP controlled.  On diuretic.    4.   CAD non obstructive on statin.  5.   HLD on statin.  6.   Fever of 101, followed by PCP only occurred for several hours, WBC was normal.  No further episodes.   7.  Iron def  anemia followed by PCP last Hgb 9.4 per PCP   Please note pt prefers not to see me anymore, she felt I was not truthful with her when I explained she needed to see Dr. Cyndia Bent.     Current medicines are reviewed with the patient today.  The patient Has no concerns regarding medicines.  The following changes have been made:  See above Labs/ tests ordered today include:see above  Disposition:   FU:  see above  Signed, Cecilie Kicks, NP  04/07/2017 12:11 PM    [facility] [place], [place], [place], [place] Phone: [phone]; Fax: [phone]

## 2017-04-07 NOTE — Patient Instructions (Addendum)
Medication Instructions:  Your physician recommends that you continue on your current medications as directed. Please refer to the Current Medication list given to you today.   Labwork: None ordered  Testing/Procedures: None ordered  Follow-Up: Your physician wants you to follow-up in: 6-8 weeks with Dr. Marlou Porch for post valve repair  Any Other Special Instructions Will Be Listed Below (If Applicable).     If you need a refill on your cardiac medications before your next appointment, please call your pharmacy.

## 2017-04-08 ENCOUNTER — Telehealth (HOSPITAL_COMMUNITY): Payer: Self-pay

## 2017-04-13 NOTE — Telephone Encounter (Signed)
I called and left message on voicemail to call office and confirm patient declined scheduling for cardiac rehab per note from [facility].  I left office contact information on patient voicemail to return call.

## 2017-04-14 ENCOUNTER — Ambulatory Visit (INDEPENDENT_AMBULATORY_CARE_PROVIDER_SITE_OTHER): Payer: PPO | Admitting: *Deleted

## 2017-04-14 DIAGNOSIS — Z952 Presence of prosthetic heart valve: Secondary | ICD-10-CM

## 2017-04-14 DIAGNOSIS — Z5181 Encounter for therapeutic drug level monitoring: Secondary | ICD-10-CM

## 2017-04-14 DIAGNOSIS — I351 Nonrheumatic aortic (valve) insufficiency: Secondary | ICD-10-CM

## 2017-04-14 LAB — POCT INR: INR: 1.6

## 2017-04-14 MED ORDER — WARFARIN SODIUM 5 MG PO TABS: ORAL_TABLET | ORAL | 1 refills | Status: DC

## 2017-04-21 ENCOUNTER — Other Ambulatory Visit: Payer: Self-pay | Admitting: Surgery

## 2017-04-21 ENCOUNTER — Ambulatory Visit (INDEPENDENT_AMBULATORY_CARE_PROVIDER_SITE_OTHER): Payer: PPO

## 2017-04-21 ENCOUNTER — Encounter (INDEPENDENT_AMBULATORY_CARE_PROVIDER_SITE_OTHER): Payer: Self-pay

## 2017-04-21 DIAGNOSIS — Z952 Presence of prosthetic heart valve: Secondary | ICD-10-CM

## 2017-04-21 DIAGNOSIS — Z5181 Encounter for therapeutic drug level monitoring: Secondary | ICD-10-CM | POA: Diagnosis not present

## 2017-04-21 DIAGNOSIS — I351 Nonrheumatic aortic (valve) insufficiency: Secondary | ICD-10-CM | POA: Diagnosis not present

## 2017-04-21 LAB — POCT INR: INR: 3.1

## 2017-04-22 ENCOUNTER — Ambulatory Visit
Admission: RE | Admit: 2017-04-22 | Discharge: 2017-04-22 | Disposition: A | Discharge status: Home / Self Care | Payer: PPO | Source: Ambulatory Visit | Attending: Surgery | Admitting: Surgery

## 2017-04-22 ENCOUNTER — Ambulatory Visit (INDEPENDENT_AMBULATORY_CARE_PROVIDER_SITE_OTHER): Payer: Self-pay | Admitting: Surgery

## 2017-04-22 ENCOUNTER — Encounter: Payer: Self-pay | Admitting: Surgery

## 2017-04-22 VITALS — BP 115/74 | HR 87 | Resp 16 | Ht 65.0 in | Wt 130.4 lb

## 2017-04-22 DIAGNOSIS — Z952 Presence of prosthetic heart valve: Secondary | ICD-10-CM

## 2017-04-22 DIAGNOSIS — I517 Cardiomegaly: Secondary | ICD-10-CM | POA: Diagnosis not present

## 2017-04-22 DIAGNOSIS — I351 Nonrheumatic aortic (valve) insufficiency: Secondary | ICD-10-CM

## 2017-04-22 DIAGNOSIS — Z9889 Other specified postprocedural states: Secondary | ICD-10-CM

## 2017-04-22 DIAGNOSIS — I340 Nonrheumatic mitral (valve) insufficiency: Secondary | ICD-10-CM

## 2017-04-22 NOTE — Progress Notes (Signed)
HPI: Patient returns for routine postoperative follow-up having undergone AVR using a 23 mm pericardial valve and mitral annuloplasty on 03/12/2017. Her preop EF was about 30%. The patient's early postoperative recovery while in the hospital was notable for an uncomplicated postop course. Since hospital discharge the patient reports that she has been feeling well. She is not walking much because of the humidity. She denies any chest pain or shortness of breath.   Current Outpatient Prescriptions  Medication Sig Dispense Refill  . acetaminophen (TYLENOL) 500 MG tablet Take 500 mg by mouth daily as needed for headache (pain).     Marland Kitchen aspirin EC 81 MG EC tablet Take 1 tablet (81 mg total) by mouth daily.    Marland Kitchen atorvastatin (LIPITOR) 20 MG tablet TAKE ONE TABLET BY MOUTH ONCE DAILY AT 6 PM 90 tablet 3  . buPROPion (WELLBUTRIN XL) 150 MG 24 hr tablet Take 1 tablet (150 mg total) by mouth daily. 30 tablet 5  . carvedilol (COREG) 6.25 MG tablet Take 1 tablet (6.25 mg total) by mouth 2 (two) times daily. 180 tablet 1  . Ferrous Fumarate (HEMOCYTE - 106 MG FE) 324 (106 Fe) MG TABS tablet Take 1 tablet (106 mg of iron total) by mouth 2 (two) times daily. 180 tablet 3  . fluticasone (FLONASE) 50 MCG/ACT nasal spray Place 1 spray into both nostrils daily as needed for allergies or rhinitis. 16 g 2  . furosemide (LASIX) 40 MG tablet Take 1 tablet (40 mg total) by mouth daily. Monday and thurday take '60mg'$  30 tablet 2  . levothyroxine (SYNTHROID, LEVOTHROID) 112 MCG tablet TAKE ONE TABLET BY MOUTH ONCE DAILY BEFORE BREAKFAST 30 tablet 4  . lisinopril (PRINIVIL,ZESTRIL) 10 MG tablet Take 1 tablet (10 mg total) by mouth daily. 90 tablet 1  . mirtazapine (REMERON) 7.5 MG tablet Take 1 tablet (7.5 mg total) by mouth at bedtime. 90 tablet 3  . potassium chloride SA (K-DUR,KLOR-CON) 20 MEQ tablet Take 1 tablet (20 mEq total) by mouth daily. 90 tablet 1  . traMADol (ULTRAM) 50 MG tablet Take 1-2 tablets (50-100 mg  total) by mouth every 4 (four) hours as needed for moderate pain. 30 tablet 0  . traZODone (DESYREL) 150 MG tablet Take 1 tablet (150 mg total) by mouth at bedtime. 90 tablet 1  . warfarin (COUMADIN) 5 MG tablet Take as directed by coumadin clinic 35 tablet 1   No current facility-administered medications for this visit.     Physical Exam: BP 115/74 (BP Location: Right Arm, Patient Position: Sitting, Cuff Size: Normal)   Pulse 87   Resp 16   Ht '5\' 5"'$  (1.651 m)   Wt 130 lb 6.4 oz (59.1 kg)   LMP 01/17/2015   SpO2 99% Comment: RA  BMI 21.70 kg/m  She looks well. Lung exam is clear. Cardiac exam shows a regular rate and rhythm with normal heart sounds. I do not hear a murmur. Chest incision is healing well and sternum is stable. There is no peripheral edema.   Diagnostic Tests:  CLINICAL DATA:  Valve replacement  EXAM: CHEST  2 VIEW  COMPARISON:  03/17/2017  FINDINGS: Prior median sternotomy and aortic valve and mitral valve replacement. Cardiomegaly. No confluent opacities, effusions or pneumothorax.  IMPRESSION: Cardiomegaly.  No active disease.   Electronically Signed   By: Rolm Baptise M.D.   On: 04/22/2017 10:09   Impression:  Overall I think she is doing well. I encouraged her to start walking more. She  does not want to go to cardiac rehab. I told her that she could start driving again but should not lift more than 10 lbs for 3 months postop. Her INR yesterday was 3.1 and her anticoagulation is being followed in the [facility] clinic. She will only need to be on Coumadin for 3 months and then can be switched to ASA. She will need a follow up echo in the next few months to establish a new baseline with her reduced EF preop.   Plan:  She will continue to follow up with Dr. Mariea Clonts and Dr. Marlou Porch and will contact me if she has any problems with her incision.   Gaye Pollack, MD [facility] [phone]

## 2017-05-01 ENCOUNTER — Ambulatory Visit (INDEPENDENT_AMBULATORY_CARE_PROVIDER_SITE_OTHER): Payer: PPO

## 2017-05-01 DIAGNOSIS — Z952 Presence of prosthetic heart valve: Secondary | ICD-10-CM

## 2017-05-01 DIAGNOSIS — Z5181 Encounter for therapeutic drug level monitoring: Secondary | ICD-10-CM

## 2017-05-01 DIAGNOSIS — I351 Nonrheumatic aortic (valve) insufficiency: Secondary | ICD-10-CM | POA: Diagnosis not present

## 2017-05-01 LAB — POCT INR: INR: 3.9

## 2017-05-05 ENCOUNTER — Encounter: Payer: Self-pay | Admitting: *Deleted

## 2017-05-05 ENCOUNTER — Other Ambulatory Visit: Payer: Self-pay | Admitting: *Deleted

## 2017-05-05 NOTE — Patient Outreach (Signed)
[facility] Care Management  05/05/2017  Miranda [name] Jun 13, 1966 [id number]  Unsuccessful call attempts x 3 and no response to outreach letter.   Plan: Will close case. Send MD closure letter.   Sherrin Daisy, RN BSN [place] Management Coordinator [facility] Care Management  [phone]

## 2017-05-07 ENCOUNTER — Ambulatory Visit (INDEPENDENT_AMBULATORY_CARE_PROVIDER_SITE_OTHER): Payer: PPO | Admitting: Internal Medicine

## 2017-05-07 ENCOUNTER — Encounter: Payer: Self-pay | Admitting: Internal Medicine

## 2017-05-07 VITALS — BP 120/80 | HR 84 | Temp 97.9°F | Wt 132.0 lb

## 2017-05-07 DIAGNOSIS — I5042 Chronic combined systolic (congestive) and diastolic (congestive) heart failure: Secondary | ICD-10-CM

## 2017-05-07 DIAGNOSIS — D500 Iron deficiency anemia secondary to blood loss (chronic): Secondary | ICD-10-CM

## 2017-05-07 DIAGNOSIS — Z23 Encounter for immunization: Secondary | ICD-10-CM | POA: Diagnosis not present

## 2017-05-07 DIAGNOSIS — Z952 Presence of prosthetic heart valve: Secondary | ICD-10-CM

## 2017-05-07 DIAGNOSIS — E039 Hypothyroidism, unspecified: Secondary | ICD-10-CM

## 2017-05-07 DIAGNOSIS — Z9889 Other specified postprocedural states: Secondary | ICD-10-CM | POA: Diagnosis not present

## 2017-05-07 DIAGNOSIS — E782 Mixed hyperlipidemia: Secondary | ICD-10-CM

## 2017-05-07 MED ORDER — LISINOPRIL 10 MG PO TABS: 10.0000 mg | ORAL_TABLET | Freq: Every day | ORAL | 3 refills | Status: DC

## 2017-05-07 MED ORDER — TRAZODONE HCL 150 MG PO TABS: 150.0000 mg | ORAL_TABLET | Freq: Every day | ORAL | 3 refills | Status: DC

## 2017-05-07 MED ORDER — FUROSEMIDE 40 MG PO TABS: 40.0000 mg | ORAL_TABLET | Freq: Every day | ORAL | 3 refills | Status: DC

## 2017-05-07 NOTE — Progress Notes (Signed)
Location:  [facility] clinic Provider:  Timi Reeser L. Mariea Clonts, D.O., C.M.D.  Code Status: full code Goals of Care:  Advanced Directives 03/26/2017  Does Patient Have a Medical Advance Directive? No  Does patient want to make changes to medical advance directive? -  Would patient like information on creating a medical advance directive? -  Pre-existing out of facility DNR order (yellow form or pink MOST form) -   Chief Complaint  Patient presents with  . Follow-up    6 week follow-up, fever    HPI: Patient is a 51 y.o. female seen today for f/u with me after her aortic valve replacement and mitral annuloplasty with Dr. Cyndia Bent on 03/12/17.  She is now on coumadin managed through the anticoagulation clinic or cardiology. She also was given iron for anemia postop.  Her diuretics were continued.  She saw NP Eubanks on 03/26/17 for fever of 101.2 two days before that.  There was no clear etiology of that fever.    She had declined home health and her son was helping her manage postop. Admits to feeling better than she used to.  Still having hot flashes.  Seems like they are getting more often again.  Was told to stop taking the soy.  She wants to try the one a day women's vitamin with soy to see if that doesn't help.  They do not contain any Vitamin K when I looked them up.    Not having fever here this am.  Breathing much better. Gets some stiffness in her chest when she gets up in the am.  Otherwise ok. Doing a little activity.  Is going fishing twice a week and doing "a little" walking.   She has been released by Dr. Cyndia Bent and is to have a f/u echo and change to an asa only after 3 mos of coumadin.  Past Medical History:  Diagnosis Date  . Anemia   . Anxiety   . At risk for sudden cardiac death Nov 25, 2012  . CHF - Combined Systolic + Diastolic Dysfunction. EF of 15-20% with Grade II diastolic dysfunction on echo 10/27/12 10/28/2012  . Coronary artery disease   . Depression   . Dyspnea   . Fibroids    . Heart murmur   . Hypertension   . Hypothyroidism   . S/P cardiac catheterization, 11-25-12, normal coronaries with minimal luminal irregularities in RCA system 10/30/2012  . Thyroid disease    hypothyroidism    Past Surgical History:  Procedure Laterality Date  . AORTIC VALVE REPLACEMENT N/A 03/12/2017   Procedure: AORTIC VALVE REPLACEMENT (AVR) with Mitral Annuloplasty;  Surgeon: Gaye Pollack, MD;  [facility];  Service: Open Heart Surgery;  Laterality: N/A;  . LEFT AND RIGHT HEART CATHETERIZATION WITH CORONARY ANGIOGRAM N/A November 25, 2012   Procedure: LEFT AND RIGHT HEART CATHETERIZATION WITH CORONARY ANGIOGRAM;  Surgeon: Leonie Man, MD;  Location: [facility] CATH LAB;  Service: Cardiovascular;  Laterality: N/A;  . MITRAL VALVE REPAIR N/A 03/12/2017   Procedure: MITRAL VALVE REPAIR (MVR);  Surgeon: Gaye Pollack, MD;  Location: [place];  Service: Open Heart Surgery;  Laterality: N/A;  mitral annuloplasty  . MULTIPLE EXTRACTIONS WITH ALVEOLOPLASTY N/A 01/21/2017   Procedure: Extraction of tooth #'s 4-13 with alveoloplasty;  Surgeon: Lenn Cal, DDS;  Location: [place];  Service: Oral Surgery;  Laterality: N/A;  . RIGHT/LEFT HEART CATH AND CORONARY ANGIOGRAPHY N/A 01/09/2017   Procedure: Right/Left Heart Cath and Coronary Angiography;  Surgeon: Troy Sine, MD;  Location: [place] CV LAB;  Service: Cardiovascular;  Laterality: N/A;  . TEE WITHOUT CARDIOVERSION N/A 03/12/2017   Procedure: TRANSESOPHAGEAL ECHOCARDIOGRAM (TEE);  Surgeon: Gaye Pollack, MD;  Location: [place];  Service: Open Heart Surgery;  Laterality: N/A;    Allergies  Allergen Reactions  . Codeine Hives    Outpatient Encounter Prescriptions as of 05/07/2017  Medication Sig  . acetaminophen (TYLENOL) 500 MG tablet Take 500 mg by mouth daily as needed for headache (pain).   Marland Kitchen aspirin EC 81 MG EC tablet Take 1 tablet (81 mg total) by mouth daily.  Marland Kitchen atorvastatin (LIPITOR) 20 MG tablet TAKE ONE TABLET BY MOUTH ONCE  DAILY AT 6 PM  . buPROPion (WELLBUTRIN XL) 150 MG 24 hr tablet Take 1 tablet (150 mg total) by mouth daily.  . carvedilol (COREG) 6.25 MG tablet Take 1 tablet (6.25 mg total) by mouth 2 (two) times daily.  . Ferrous Fumarate (HEMOCYTE - 106 MG FE) 324 (106 Fe) MG TABS tablet Take 1 tablet (106 mg of iron total) by mouth 2 (two) times daily.  . fluticasone (FLONASE) 50 MCG/ACT nasal spray Place 1 spray into both nostrils daily as needed for allergies or rhinitis.  . furosemide (LASIX) 40 MG tablet Take 1 tablet (40 mg total) by mouth daily. Monday and thurday take 60mg   . levothyroxine (SYNTHROID, LEVOTHROID) 112 MCG tablet TAKE ONE TABLET BY MOUTH ONCE DAILY BEFORE BREAKFAST  . lisinopril (PRINIVIL,ZESTRIL) 10 MG tablet Take 1 tablet (10 mg total) by mouth daily.  . mirtazapine (REMERON) 7.5 MG tablet Take 1 tablet (7.5 mg total) by mouth at bedtime.  . potassium chloride SA (K-DUR,KLOR-CON) 20 MEQ tablet Take 1 tablet (20 mEq total) by mouth daily.  . traMADol (ULTRAM) 50 MG tablet Take 1-2 tablets (50-100 mg total) by mouth every 4 (four) hours as needed for moderate pain.  . traZODone (DESYREL) 150 MG tablet Take 1 tablet (150 mg total) by mouth at bedtime.  Marland Kitchen warfarin (COUMADIN) 5 MG tablet Take as directed by coumadin clinic   No facility-administered encounter medications on file as of 05/07/2017.     Review of Systems:  Review of Systems  Constitutional: Negative for chills, fever and malaise/fatigue.  HENT: Negative for congestion and hearing loss.   Eyes: Positive for double vision.       Nystagmus  Respiratory: Negative for shortness of breath.   Cardiovascular: Negative for chest pain, palpitations and leg swelling.  Gastrointestinal: Negative for abdominal pain.  Genitourinary: Negative for dysuria.  Musculoskeletal: Negative for falls and joint pain.  Skin: Negative for itching and rash.  Neurological: Negative for dizziness, loss of consciousness and weakness.    Psychiatric/Behavioral: Negative for depression, hallucinations, memory loss and suicidal ideas. The patient does not have insomnia.     Health Maintenance  Topic Date Due  . INFLUENZA VACCINE  03/04/2017  . MAMMOGRAM  02/26/2018  . PAP SMEAR  01/25/2019  . Fecal DNA (Cologuard)  02/14/2019  . TETANUS/TDAP  01/24/2026  . Hepatitis C Screening  Completed  . HIV Screening  Completed    Physical Exam: Vitals:   05/07/17 1138  BP: 120/80  Pulse: 84  Temp: 97.9 F (36.6 C)  TempSrc: Oral  SpO2: 97%  Weight: 132 lb (59.9 kg)   Body mass index is 21.97 kg/m. Physical Exam  Constitutional: She is oriented to person, place, and time. No distress.  HENT:  Head: Normocephalic and atraumatic.  Eyes:  glasses  Cardiovascular: Normal rate, regular rhythm  and intact distal pulses.   Murmur heard. Pulmonary/Chest: Effort normal and breath sounds normal. No respiratory distress.  Abdominal: Bowel sounds are normal.  Musculoskeletal: Normal range of motion.  Neurological: She is alert and oriented to person, place, and time.  Skin: Skin is warm and dry.  Psychiatric: She has a normal mood and affect. Her behavior is normal.    Labs reviewed: Basic Metabolic Panel:  Recent Labs  09/26/16 1019  03/12/17 1857  03/13/17 0306 03/13/17 1652  03/15/17 0351 03/16/17 0519 03/26/17 0959  NA 140  < >  --   < > 136  --   < > 134* 136 138  K 3.8  < >  --   < > 5.2*  --   < > 3.7 4.2 4.2  CL 108  < >  --   < > 107  --   < > 99* 102 103  CO2 21  < >  --   --  23  --   < > 28 27 26   GLUCOSE 79  < >  --   < > 124*  --   < > 106* 95 81  BUN 10  < >  --   < > 11  --   < > 9 10 14   CREATININE 0.86  < > 0.68  < > 0.65 0.66  < > 0.61 0.61 0.67  CALCIUM 8.9  < >  --   --  8.3*  --   < > 8.2* 8.6* 9.0  MG  --   --  2.9*  --  2.3 2.0  --   --   --   --   TSH 3.62  --   --   --   --   --   --   --   --   --   < > = values in this interval not displayed. Liver Function Tests:  Recent Labs   01/14/17 1005 03/10/17 1033  AST 55* 76*  ALT 30 51  ALKPHOS 101 86  BILITOT 0.4 0.4  PROT 7.5 8.2*  ALBUMIN 3.0* 3.4*   No results for input(s): LIPASE, AMYLASE in the last 8760 hours. No results for input(s): AMMONIA in the last 8760 hours. CBC:  Recent Labs  09/26/16 1019 11/27/16 0935  03/15/17 0351 03/16/17 0519 03/26/17 0959  WBC 5.6 6.1  < > 10.6* 8.2 8.0  NEUTROABS 3,360 3.7  --   --   --  5,440  HGB 11.9 11.0*  < > 8.1* 9.6* 9.4*  HCT 37.6 36.8  < > 25.6* 29.6* 29.9*  MCV 87.2 94  < > 83.7 84.6 87.2  PLT 218 232  < > 105* 150 482*  < > = values in this interval not displayed. Lipid Panel:  Recent Labs  09/26/16 1019  CHOL 163  HDL 60  LDLCALC 92  TRIG 54  CHOLHDL 2.7   Lab Results  Component Value Date   HGBA1C 6.1 (H) 03/10/2017    Procedures since last visit: Dg Chest 2 View  Result Date: 04/22/2017 CLINICAL DATA:  Valve replacement EXAM: CHEST  2 VIEW COMPARISON:  03/17/2017 FINDINGS: Prior median sternotomy and aortic valve and mitral valve replacement. Cardiomegaly. No confluent opacities, effusions or pneumothorax. IMPRESSION: Cardiomegaly.  No active disease. Electronically Signed   By: Rolm Baptise M.D.   On: 04/22/2017 10:09    Assessment/Plan 1. Chronic combined systolic and diastolic congestive heart failure ([place]) - doing much better, requests  90 days of all meds except the wellbutrin so updated rxs - CBC with Differential/Platelet; Future - Basic metabolic panel; Future - lisinopril (PRINIVIL,ZESTRIL) 10 MG tablet; Take 1 tablet (10 mg total) by mouth daily.  Dispense: 90 tablet; Refill: 3  2. Iron deficiency anemia due to chronic blood loss -anemia postop, cont iron supplement, then f/u labs before next visit  3. S/P mitral valve repair -doing great postop, encouraged more walking for exercise since she's opted not to do cardiac rehab  4. S/P AVR (aortic valve replacement) -see #3  5. Hypothyroidism, unspecified type - cont  current levothyroxine - TSH; Future  6. Mixed hyperlipidemia -last LDL 92, cont lipitor and f/u lab before next visit, encouraged exercise - Lipid panel; Future  7. Need for influenza vaccination - Flu Vaccine QUAD 6+ mos PF IM (Fluarix Quad PF)  Labs/tests ordered:   Orders Placed This Encounter  Procedures  . Flu Vaccine QUAD 6+ mos PF IM (Fluarix Quad PF)  . TSH    Standing Status:   Future    Standing Expiration Date:   01/05/2018  . CBC with Differential/Platelet    Standing Status:   Future    Standing Expiration Date:   01/05/2018  . Basic metabolic panel    Standing Status:   Future    Standing Expiration Date:   01/05/2018    Order Specific Question:   Has the patient fasted?    Answer:   Yes  . Lipid panel    Standing Status:   Future    Standing Expiration Date:   01/05/2018    Order Specific Question:   Has the patient fasted?    Answer:   Yes    Next appt:  09/10/2017 med mgt, labs before  Joshalyn Ancheta L. Sunaina Ferrando, D.O. [facility] [address], [place] [address] Cell Phone (Mon-Fri 8am-5pm):  [phone] On Call:  [phone] & follow prompts after 5pm & weekends Office Phone:  [phone] Office Fax:  [phone]

## 2017-05-11 ENCOUNTER — Ambulatory Visit (INDEPENDENT_AMBULATORY_CARE_PROVIDER_SITE_OTHER): Payer: PPO | Admitting: Pharmacist

## 2017-05-11 DIAGNOSIS — Z5181 Encounter for therapeutic drug level monitoring: Secondary | ICD-10-CM | POA: Diagnosis not present

## 2017-05-11 DIAGNOSIS — I351 Nonrheumatic aortic (valve) insufficiency: Secondary | ICD-10-CM | POA: Diagnosis not present

## 2017-05-11 DIAGNOSIS — Z952 Presence of prosthetic heart valve: Secondary | ICD-10-CM | POA: Diagnosis not present

## 2017-05-11 LAB — POCT INR: INR: 3.9

## 2017-05-22 ENCOUNTER — Ambulatory Visit (INDEPENDENT_AMBULATORY_CARE_PROVIDER_SITE_OTHER): Payer: PPO | Admitting: *Deleted

## 2017-05-22 ENCOUNTER — Encounter (INDEPENDENT_AMBULATORY_CARE_PROVIDER_SITE_OTHER): Payer: Self-pay

## 2017-05-22 DIAGNOSIS — Z5181 Encounter for therapeutic drug level monitoring: Secondary | ICD-10-CM

## 2017-05-22 DIAGNOSIS — Z952 Presence of prosthetic heart valve: Secondary | ICD-10-CM | POA: Diagnosis not present

## 2017-05-22 DIAGNOSIS — I351 Nonrheumatic aortic (valve) insufficiency: Secondary | ICD-10-CM

## 2017-05-22 LAB — POCT INR: INR: 3.1

## 2017-06-01 ENCOUNTER — Other Ambulatory Visit: Payer: Self-pay | Admitting: Nurse Practitioner

## 2017-06-01 ENCOUNTER — Encounter: Payer: Self-pay | Admitting: Nurse Practitioner

## 2017-06-01 ENCOUNTER — Ambulatory Visit (INDEPENDENT_AMBULATORY_CARE_PROVIDER_SITE_OTHER): Payer: PPO | Admitting: Pharmacist

## 2017-06-01 ENCOUNTER — Ambulatory Visit (INDEPENDENT_AMBULATORY_CARE_PROVIDER_SITE_OTHER): Payer: PPO | Admitting: Nurse Practitioner

## 2017-06-01 VITALS — BP 122/72 | HR 73 | Ht 64.5 in | Wt 137.1 lb

## 2017-06-01 DIAGNOSIS — R0602 Shortness of breath: Secondary | ICD-10-CM | POA: Diagnosis not present

## 2017-06-01 DIAGNOSIS — Z9889 Other specified postprocedural states: Secondary | ICD-10-CM

## 2017-06-01 DIAGNOSIS — Z952 Presence of prosthetic heart valve: Secondary | ICD-10-CM

## 2017-06-01 DIAGNOSIS — Z79899 Other long term (current) drug therapy: Secondary | ICD-10-CM

## 2017-06-01 DIAGNOSIS — I428 Other cardiomyopathies: Secondary | ICD-10-CM

## 2017-06-01 DIAGNOSIS — I351 Nonrheumatic aortic (valve) insufficiency: Secondary | ICD-10-CM | POA: Diagnosis not present

## 2017-06-01 DIAGNOSIS — R7989 Other specified abnormal findings of blood chemistry: Secondary | ICD-10-CM

## 2017-06-01 DIAGNOSIS — Z5181 Encounter for therapeutic drug level monitoring: Secondary | ICD-10-CM | POA: Diagnosis not present

## 2017-06-01 DIAGNOSIS — Z7901 Long term (current) use of anticoagulants: Secondary | ICD-10-CM | POA: Diagnosis not present

## 2017-06-01 DIAGNOSIS — R945 Abnormal results of liver function studies: Principal | ICD-10-CM

## 2017-06-01 DIAGNOSIS — E7849 Other hyperlipidemia: Secondary | ICD-10-CM | POA: Diagnosis not present

## 2017-06-01 LAB — CBC
Hematocrit: 34.7 % (ref 34.0–46.6)
Hemoglobin: 11.4 g/dL (ref 11.1–15.9)
MCH: 26.9 pg (ref 26.6–33.0)
MCHC: 32.9 g/dL (ref 31.5–35.7)
MCV: 82 fL (ref 79–97)
Platelets: 272 10*3/uL (ref 150–379)
RBC: 4.24 x10E6/uL (ref 3.77–5.28)
RDW: 15.2 % (ref 12.3–15.4)
WBC: 6.9 10*3/uL (ref 3.4–10.8)

## 2017-06-01 LAB — HEPATIC FUNCTION PANEL
ALT: 72 IU/L — ABNORMAL HIGH (ref 0–32)
AST: 91 IU/L — ABNORMAL HIGH (ref 0–40)
Albumin: 3.8 g/dL (ref 3.5–5.5)
Alkaline Phosphatase: 163 IU/L — ABNORMAL HIGH (ref 39–117)
Bilirubin Total: 0.3 mg/dL (ref 0.0–1.2)
Bilirubin, Direct: 0.07 mg/dL (ref 0.00–0.40)
Total Protein: 8.3 g/dL (ref 6.0–8.5)

## 2017-06-01 LAB — POCT INR: INR: 2.8

## 2017-06-01 LAB — LIPID PANEL
Chol/HDL Ratio: 2.8 ratio (ref 0.0–4.4)
Cholesterol, Total: 138 mg/dL (ref 100–199)
HDL: 49 mg/dL (ref >39)
LDL Calculated: 72 mg/dL (ref 0–99)
Triglycerides: 84 mg/dL (ref 0–149)
VLDL Cholesterol Cal: 17 mg/dL (ref 5–40)

## 2017-06-01 LAB — BASIC METABOLIC PANEL
BUN/Creatinine Ratio: 13 (ref 9–23)
BUN: 10 mg/dL (ref 6–24)
CO2: 25 mmol/L (ref 20–29)
Calcium: 9.5 mg/dL (ref 8.7–10.2)
Chloride: 99 mmol/L (ref 96–106)
Creatinine, Ser: 0.76 mg/dL (ref 0.57–1.00)
GFR calc Af Amer: 104 mL/min/{1.73_m2} (ref >59)
GFR calc non Af Amer: 90 mL/min/{1.73_m2} (ref >59)
Glucose: 86 mg/dL (ref 65–99)
Potassium: 4.1 mmol/L (ref 3.5–5.2)
Sodium: 138 mmol/L (ref 134–144)

## 2017-06-01 LAB — PRO B NATRIURETIC PEPTIDE: NT-Pro BNP: 458 pg/mL — ABNORMAL HIGH (ref 0–249)

## 2017-06-01 NOTE — Patient Instructions (Addendum)
We will be checking the following labs today - BMET, CBC, HPF, Lipids and BNP   Medication Instructions:    Continue with your current medicines.     Testing/Procedures To Be Arranged:  Echocardiogram  Follow-Up:   See Dr. Marlou Porch in about 2 months.     Other Special Instructions:   N/A    If you need a refill on your cardiac medications before your next appointment, please call your pharmacy.   Call the [place] office at [phone] if you have any questions, problems or concerns.

## 2017-06-01 NOTE — Progress Notes (Signed)
hepat

## 2017-06-01 NOTE — Progress Notes (Signed)
CARDIOLOGY OFFICE NOTE  Date:  06/01/2017    Miranda [name] Date of Birth: 1966/02/06 Medical Record [id number]  PCP:  Gayland Curry, DO  Cardiologist:  [facility]   Chief Complaint  Patient presents with  . Cardiac Valve Problem    Follow up visit - seen for Dr. Marlou Porch    History of Present Illness: Miranda [name] is a 51 y.o. female who presents today for a follow up visit. Seen for Dr. Marlou Porch.   She has a history of iron deficiency anemia, mild nonobstructive CAD, chronic systolic HF, NICM, with previous EF at 15%. In March 2015 EF was 50%. On last visit in Sept 2017 she was doing well on ACE and BB- and no longer on lasix. Her EKG is chronically abnormal with T wave inversion in several leads.   Seen for acute CHF back this past summer - echo with severe aortic regurgitation and EF back down to 30-35%.    Cath with Ost RPDA to RPDA lesion 50% stenosed, severe LV dysfunction mod pulmonary hypertension, and mild 2 + MR.  EF was 15%.  PA pk pressure was 60 mmHg. Severe aortic insuff. Mildly dilated aortic root.   Mild coronary obstructive disease with a normal LAD, normal small intermediate vessel, normal left circumflex, and a large dominant RCA with smooth 50% ostial PDA narrowing.)  Multiple teeth extractions. Then underwent AVR with Dr. Cyndia Bent on 03/12/2017 with 23 mm Edwards Magna-Ease pericardial valve and MV annuloplasty with 28 mm Sorin 3D MEMO ring.   Noted by Dr. Cyndia Bent to need coumadin for 3 months and then can switch to Aspirin.   Seen for her post op visit back last month by Cecilie Kicks, NP - felt to be doing ok. Did not wish to go to cardiac rehab.   Comes in today. Here alone. She notes that she is doing ok. Spends a lot of time fishing if the weather is ok. Does some shopping for elderly folks. Trying to get more activity. No chest pain. Breathing is ok but sometimes "need to take a double breath". Her weight is stable at home per her report - it is up here  today. No swelling. Taking her medicines. Not dizzy. Fasting today. She has been advised to stop her coumadin as of November 16th. She is already on baby aspirin.   Past Medical History:  Diagnosis Date  . Anemia   . Anxiety   . At risk for sudden cardiac death 10-30-12  . CHF - Combined Systolic + Diastolic Dysfunction. EF of 15-20% with Grade II diastolic dysfunction on echo 10/27/12 10/28/2012  . Coronary artery disease   . Depression   . Dyspnea   . Fibroids   . Heart murmur   . Hypertension   . Hypothyroidism   . S/P cardiac catheterization, 2012/10/30, normal coronaries with minimal luminal irregularities in RCA system 10/30/2012  . Thyroid disease    hypothyroidism    Past Surgical History:  Procedure Laterality Date  . AORTIC VALVE REPLACEMENT N/A 03/12/2017   Procedure: AORTIC VALVE REPLACEMENT (AVR) with Mitral Annuloplasty;  Surgeon: Gaye Pollack, MD;  [facility];  Service: Open Heart Surgery;  Laterality: N/A;  . LEFT AND RIGHT HEART CATHETERIZATION WITH CORONARY ANGIOGRAM N/A 2012-10-30   Procedure: LEFT AND RIGHT HEART CATHETERIZATION WITH CORONARY ANGIOGRAM;  Surgeon: Leonie Man, MD;  Location: [facility] CATH LAB;  Service: Cardiovascular;  Laterality: N/A;  . MITRAL VALVE REPAIR N/A 03/12/2017   Procedure: MITRAL VALVE  REPAIR (MVR);  Surgeon: Gaye Pollack, MD;  Location: [place];  Service: Open Heart Surgery;  Laterality: N/A;  mitral annuloplasty  . MULTIPLE EXTRACTIONS WITH ALVEOLOPLASTY N/A 01/21/2017   Procedure: Extraction of tooth #'s 4-13 with alveoloplasty;  Surgeon: Lenn Cal, DDS;  Location: [place];  Service: Oral Surgery;  Laterality: N/A;  . RIGHT/LEFT HEART CATH AND CORONARY ANGIOGRAPHY N/A 01/09/2017   Procedure: Right/Left Heart Cath and Coronary Angiography;  Surgeon: Troy Sine, MD;  Location: [place] CV LAB;  Service: Cardiovascular;  Laterality: N/A;  . TEE WITHOUT CARDIOVERSION N/A 03/12/2017   Procedure: TRANSESOPHAGEAL ECHOCARDIOGRAM  (TEE);  Surgeon: Gaye Pollack, MD;  Location: [place];  Service: Open Heart Surgery;  Laterality: N/A;     Medications: Current Meds  Medication Sig  . acetaminophen (TYLENOL) 500 MG tablet Take 500 mg by mouth daily as needed for headache (pain).   Marland Kitchen aspirin EC 81 MG EC tablet Take 1 tablet (81 mg total) by mouth daily.  Marland Kitchen atorvastatin (LIPITOR) 20 MG tablet TAKE ONE TABLET BY MOUTH ONCE DAILY AT 6 PM  . buPROPion (WELLBUTRIN XL) 150 MG 24 hr tablet Take 1 tablet (150 mg total) by mouth daily.  . carvedilol (COREG) 6.25 MG tablet Take 1 tablet (6.25 mg total) by mouth 2 (two) times daily.  . Ferrous Fumarate (HEMOCYTE - 106 MG FE) 324 (106 Fe) MG TABS tablet Take 1 tablet (106 mg of iron total) by mouth 2 (two) times daily.  . fluticasone (FLONASE) 50 MCG/ACT nasal spray Place 1 spray into both nostrils daily as needed for allergies or rhinitis.  . furosemide (LASIX) 40 MG tablet Take 1 tablet (40 mg total) by mouth daily. Monday and thurday take '60mg'$   . levothyroxine (SYNTHROID, LEVOTHROID) 112 MCG tablet TAKE ONE TABLET BY MOUTH ONCE DAILY BEFORE BREAKFAST  . lisinopril (PRINIVIL,ZESTRIL) 10 MG tablet Take 1 tablet (10 mg total) by mouth daily.  . mirtazapine (REMERON) 7.5 MG tablet Take 1 tablet (7.5 mg total) by mouth at bedtime.  . Multiple Vitamins-Minerals (ONE-A-DAY MENOPAUSE FORMULA) TABS Take 1 tablet by mouth daily.  . potassium chloride SA (K-DUR,KLOR-CON) 20 MEQ tablet Take 1 tablet (20 mEq total) by mouth daily.  . traMADol (ULTRAM) 50 MG tablet Take 1-2 tablets (50-100 mg total) by mouth every 4 (four) hours as needed for moderate pain.  . traZODone (DESYREL) 150 MG tablet Take 1 tablet (150 mg total) by mouth at bedtime.  Marland Kitchen warfarin (COUMADIN) 5 MG tablet Take as directed by coumadin clinic     Allergies: Allergies  Allergen Reactions  . Codeine Hives    Social History: The patient  reports that she quit smoking about 38 years ago. Her smoking use included Cigars  and Cigarettes. She has a 2.50 pack-year smoking history. She has never used smokeless tobacco. She reports that she drinks about 1.8 oz of alcohol per week . She reports that she uses drugs, including Marijuana, about 5 times per week.   Family History: The patient's family history includes Arthritis in her paternal uncle; Autoimmune disease in her mother; Breast cancer in her cousin; Cancer in her maternal uncle and paternal grandfather; Hypertension in her father, maternal grandmother, and mother; Stroke in her father.   Review of Systems: Please see the history of present illness.   Otherwise, the review of systems is positive for none.   All other systems are reviewed and negative.   Physical Exam: VS:  BP 122/72   Pulse 73  Ht 5' 4.5" (1.638 m)   Wt 137 lb 1.9 oz (62.2 kg)   LMP 01/17/2015   SpO2 96%   BMI 23.17 kg/m  .  BMI Body mass index is 23.17 kg/m.  Wt Readings from Last 3 Encounters:  06/01/17 137 lb 1.9 oz (62.2 kg)  05/07/17 132 lb (59.9 kg)  04/22/17 130 lb 6.4 oz (59.1 kg)    General: Pleasant. Alert and in no acute distress.   HEENT: Normal.  Neck: Supple, no JVD, carotid bruits, or masses noted.  Cardiac: Regular rate and rhythm. Soft outflow murmur. No edema.  Respiratory:  Lungs are clear to auscultation bilaterally with normal work of breathing.  GI: Soft and nontender.  MS: No deformity or atrophy. Gait and ROM intact.  Skin: Warm and dry. Color is normal.  Neuro:  Strength and sensation are intact and no gross focal deficits noted.  Psych: Alert, appropriate and with normal affect.   LABORATORY DATA:  EKG:  EKG is not ordered today.  Lab Results  Component Value Date   WBC 8.0 03/26/2017   HGB 9.4 (L) 03/26/2017   HCT 29.9 (L) 03/26/2017   PLT 482 (H) 03/26/2017   GLUCOSE 81 03/26/2017   CHOL 163 09/26/2016   TRIG 54 09/26/2016   HDL 60 09/26/2016   LDLCALC 92 09/26/2016   ALT 51 03/10/2017   AST 76 (H) 03/10/2017   NA 138 03/26/2017    K 4.2 03/26/2017   CL 103 03/26/2017   CREATININE 0.67 03/26/2017   BUN 14 03/26/2017   CO2 26 03/26/2017   TSH 3.62 09/26/2016   INR 2.8 06/01/2017   HGBA1C 6.1 (H) 03/10/2017   Lab Results  Component Value Date   INR 2.8 06/01/2017   INR 3.1 05/22/2017   INR 3.9 05/11/2017     BNP (last 3 results) No results for input(s): BNP in the last 8760 hours.  ProBNP (last 3 results)  Recent Labs  11/27/16 0935  PROBNP 5,337*     Other Studies Reviewed Today:  Echo TEE 03/12/17 Result status: Final result   Left ventricle: Cavity is moderately dilated. LV systolic function is severely reduced with an EF of 25-30%. No thrombus present. No mass present.  Septum: No Patent Foramen Ovale present.  Left atrium: Patent foramen ovale not present.  Left atrium: Cavity is dilated and dilated.  Aortic valve: The valve is trileaflet. Mild valve thickening present. No stenosis. Moderate to severe regurgitation. There is flow reversal in the descending aorta.  Mitral valve: Non-specific thickening. Moderate regurgitation.  Right ventricle: Cavity is moderately dilated. Moderately reduced systolic function.  Right atrium: Cavity is dilated.  Tricuspid valve: Mild regurgitation. The tricuspid valve regurgitation jet is directed toward the right atrial free wall.  Pulmonic valve: Trace regurgitation.  Right ventricle: Normal cavity size. Mildly reduced systolic function.     Procedure:  1. Median Sternotomy 2. Extracorporeal circulation 3. Aortic valve replacement using a 23 mm Edwards Magna-Ease pericardialvalve. 4. Mitral valve annuloplasty using a 28 mm Sorin 3D MEMO ring.   Procedures   Right/Left Heart Cath and Coronary Angiography 01/2017  Conclusion     Ost RPDA to RPDA lesion, 50 %stenosed.  There is severe left ventricular systolic dysfunction.  Hemodynamic findings consistent with moderate pulmonary hypertension.  There is mild (2+) mitral  regurgitation.  Dilated cardiomyopathy with severe global LV dysfunction and an ejection fraction of 15%. There appears to be at least mild to moderate mitral regurgitation.  Moderate elevation of  right heart pressures with moderate pulmonary hypertension with a PA systolic pressure of 60 mmHg.  Mildly dilated aortic root with moderately severe aortic insufficiency.  Mild coronary obstructive disease with a normal LAD, normal small intermediate vessel, normal left circumflex, and a large dominant RCA with smooth 50% ostial PDA narrowing.  RECOMMENDATION: The patient will undergo follow-up outpatient evaluation with Dr. Cyndia Bent.     Assessment/Plan:  1.  Severe AI - s/p AVR wth using a 23 mm Edwards Magna-Ease pericardialvalve from 03/2017 - doing well - needs echo updated.   2.  Mitral regurgitation with MV annuloplasty using a 28 mm Sorin 3D MEMO ring 03/2017. Doing well - needs echo updated.   3.   Cardiomyopathy, nonischemic on BB and ACE BP controlled.  On diuretic.  Weight is up but she notes it is more due to having heavier clothes on. Will check BNP today. Getting echo updated as well. Will keep on her current regimen.   4.   CAD non obstructive - manage medically. CV risk factor modification.   5.   HLD on statin. Labs today.   6.   Iron def anemia followed by PCP last Hgb 9.4 per PCP - rechecking today with her complaint of some dyspnea.   7. Chronic anticoagulation - needs for total of 3 months - then may switch to aspirin - already told to stop on November 16th. She remains on baby aspirin.   Current medicines are reviewed with the patient today.  The patient does not have concerns regarding medicines other than what has been noted above.  The following changes have been made:  See above.  Labs/ tests ordered today include:    Orders Placed This Encounter  Procedures  . Basic metabolic panel  . CBC  . Hepatic function panel  . Lipid panel  . Pro b  natriuretic peptide (BNP)  . ECHOCARDIOGRAM COMPLETE     Disposition:   FU with Dr. Marlou Porch in about 2 months.    Patient is agreeable to this plan and will call if any problems develop in the interim.   SignedTruitt Merle, NP  06/01/2017 9:08 AM  [facility] [address] [place], [place]  [address] Phone: [phone] Fax: [phone]

## 2017-06-08 ENCOUNTER — Other Ambulatory Visit: Payer: Self-pay

## 2017-06-08 ENCOUNTER — Other Ambulatory Visit: Payer: PPO | Admitting: *Deleted

## 2017-06-08 ENCOUNTER — Ambulatory Visit (HOSPITAL_COMMUNITY): Payer: PPO | Attending: Nurse Practitioner

## 2017-06-08 DIAGNOSIS — Z952 Presence of prosthetic heart valve: Secondary | ICD-10-CM | POA: Diagnosis not present

## 2017-06-08 DIAGNOSIS — R945 Abnormal results of liver function studies: Principal | ICD-10-CM

## 2017-06-08 DIAGNOSIS — I428 Other cardiomyopathies: Secondary | ICD-10-CM | POA: Diagnosis not present

## 2017-06-08 DIAGNOSIS — Z79899 Other long term (current) drug therapy: Secondary | ICD-10-CM | POA: Diagnosis not present

## 2017-06-08 DIAGNOSIS — Z9889 Other specified postprocedural states: Secondary | ICD-10-CM

## 2017-06-08 DIAGNOSIS — R7989 Other specified abnormal findings of blood chemistry: Secondary | ICD-10-CM

## 2017-06-08 DIAGNOSIS — Z87891 Personal history of nicotine dependence: Secondary | ICD-10-CM | POA: Diagnosis not present

## 2017-06-08 DIAGNOSIS — Z7901 Long term (current) use of anticoagulants: Secondary | ICD-10-CM | POA: Insufficient documentation

## 2017-06-08 LAB — HEPATIC FUNCTION PANEL
ALT: 109 IU/L — ABNORMAL HIGH (ref 0–32)
AST: 145 IU/L — ABNORMAL HIGH (ref 0–40)
Albumin: 3.8 g/dL (ref 3.5–5.5)
Alkaline Phosphatase: 169 IU/L — ABNORMAL HIGH (ref 39–117)
Bilirubin Total: 0.3 mg/dL (ref 0.0–1.2)
Bilirubin, Direct: 0.06 mg/dL (ref 0.00–0.40)
Total Protein: 8.4 g/dL (ref 6.0–8.5)

## 2017-06-10 ENCOUNTER — Other Ambulatory Visit: Payer: Self-pay | Admitting: *Deleted

## 2017-06-10 DIAGNOSIS — Z79899 Other long term (current) drug therapy: Secondary | ICD-10-CM

## 2017-06-12 ENCOUNTER — Other Ambulatory Visit: Payer: Self-pay | Admitting: *Deleted

## 2017-06-12 ENCOUNTER — Telehealth: Payer: Self-pay | Admitting: *Deleted

## 2017-06-12 DIAGNOSIS — I5021 Acute systolic (congestive) heart failure: Secondary | ICD-10-CM

## 2017-06-12 MED ORDER — SPIRONOLACTONE 25 MG PO TABS: 12.5000 mg | ORAL_TABLET | Freq: Every day | ORAL | 3 refills | Status: DC

## 2017-06-12 NOTE — Telephone Encounter (Signed)
Informed patient that she is can continue taking KCL since she is taking lasix as well. Labs will be evaluated at her next lab visit on 11/14. Pt verbalized understanding and thanked me for the call.

## 2017-06-12 NOTE — Telephone Encounter (Signed)
Patient left a msg on the refill vm stating that an rx for a new medication was sent in for her today however the pharmacist informed her that this medication can increase her potassium level. She stated that she takes potassium and wanted to know if she should discontinue it. Patient can be reached at [phone]. Thanks, MI

## 2017-06-17 ENCOUNTER — Other Ambulatory Visit: Payer: PPO | Admitting: *Deleted

## 2017-06-17 DIAGNOSIS — Z79899 Other long term (current) drug therapy: Secondary | ICD-10-CM | POA: Diagnosis not present

## 2017-06-17 DIAGNOSIS — I5021 Acute systolic (congestive) heart failure: Secondary | ICD-10-CM

## 2017-06-17 LAB — HEPATIC FUNCTION PANEL
ALT: 82 IU/L — ABNORMAL HIGH (ref 0–32)
AST: 97 IU/L — ABNORMAL HIGH (ref 0–40)
Albumin: 3.7 g/dL (ref 3.5–5.5)
Alkaline Phosphatase: 141 IU/L — ABNORMAL HIGH (ref 39–117)
Bilirubin Total: 0.2 mg/dL (ref 0.0–1.2)
Bilirubin, Direct: 0.06 mg/dL (ref 0.00–0.40)
Total Protein: 8.6 g/dL — ABNORMAL HIGH (ref 6.0–8.5)

## 2017-06-17 LAB — BASIC METABOLIC PANEL
BUN/Creatinine Ratio: 17 (ref 9–23)
BUN: 13 mg/dL (ref 6–24)
CO2: 25 mmol/L (ref 20–29)
Calcium: 9.2 mg/dL (ref 8.7–10.2)
Chloride: 102 mmol/L (ref 96–106)
Creatinine, Ser: 0.76 mg/dL (ref 0.57–1.00)
GFR calc Af Amer: 104 mL/min/{1.73_m2} (ref >59)
GFR calc non Af Amer: 90 mL/min/{1.73_m2} (ref >59)
Glucose: 91 mg/dL (ref 65–99)
Potassium: 4.1 mmol/L (ref 3.5–5.2)
Sodium: 140 mmol/L (ref 134–144)

## 2017-06-29 ENCOUNTER — Other Ambulatory Visit: Payer: Self-pay | Admitting: Cardiology

## 2017-07-06 ENCOUNTER — Ambulatory Visit: Payer: PPO | Admitting: Nurse Practitioner

## 2017-07-06 ENCOUNTER — Encounter: Payer: Self-pay | Admitting: Nurse Practitioner

## 2017-07-06 VITALS — BP 142/90 | HR 72 | Ht 64.5 in | Wt 143.0 lb

## 2017-07-06 DIAGNOSIS — E782 Mixed hyperlipidemia: Secondary | ICD-10-CM

## 2017-07-06 DIAGNOSIS — Z029 Encounter for administrative examinations, unspecified: Secondary | ICD-10-CM

## 2017-07-06 DIAGNOSIS — I10 Essential (primary) hypertension: Secondary | ICD-10-CM | POA: Diagnosis not present

## 2017-07-06 DIAGNOSIS — I428 Other cardiomyopathies: Secondary | ICD-10-CM | POA: Diagnosis not present

## 2017-07-06 DIAGNOSIS — R945 Abnormal results of liver function studies: Secondary | ICD-10-CM | POA: Diagnosis not present

## 2017-07-06 DIAGNOSIS — R7989 Other specified abnormal findings of blood chemistry: Secondary | ICD-10-CM

## 2017-07-06 DIAGNOSIS — Z952 Presence of prosthetic heart valve: Secondary | ICD-10-CM

## 2017-07-06 DIAGNOSIS — Z9889 Other specified postprocedural states: Secondary | ICD-10-CM | POA: Diagnosis not present

## 2017-07-06 NOTE — Patient Instructions (Addendum)
We will be checking the following labs today - BMET, HPF and BNP   Medication Instructions:    Continue with your current medicines.   I hope to change your medicines in the morning once I see what your lab shows.    Testing/Procedures To Be Arranged:  N/A  Follow-Up:   See Dr. Marlou Porch in January as planned.     Other Special Instructions:   N/A    If you need a refill on your cardiac medications before your next appointment, please call your pharmacy.   Call the [place] office at [phone] if you have any questions, problems or concerns.

## 2017-07-06 NOTE — Progress Notes (Signed)
CARDIOLOGY OFFICE NOTE  Date:  07/06/2017    Miranda [name] Date of Birth: [birth year] Medical Record [id number]  PCP:  Gayland Curry, DO  Cardiologist:  Marisa Cyphers   Chief Complaint  Patient presents with  . Cardiomyopathy    Follow up visit - seen for Dr. Marlou Porch    History of Present Illness: Miranda [name] is a 51 y.o. female who presents today for a one month check. Seen for Dr. Marlou Porch.   She has a history of iron deficiency anemia, mild nonobstructive CAD, chronic systolic HF, NICM, with previous EF at 15%. In March 2015 EF was 50%. On last visit in Sept 2017 she was doing well on ACE and BB- and no longer on lasix. Her EKG is chronically abnormal with T wave inversion in several leads.   Seen for acute CHF back this past summer - echo with severe aortic regurgitation and EF back down to 30-35%.  Cath with Ost RPDA to RPDA lesion 50% stenosed, severe LV dysfunction mod pulmonary hypertension, and mild 2 + MR. EF was 15%. PA pk pressure was 60 mmHg. Severe aortic insuff. Mildly dilated aortic root. Mild coronary obstructive disease with a normal LAD, normal small intermediate vessel, normal left circumflex, and a large dominant RCA with smooth 50% ostial PDA narrowing.) Multiple teeth extractions. Then underwent AVR with Dr. Cyndia Bent on 03/12/2017 with 23 mm Edwards Magna-Ease pericardial valve and MV annuloplasty with 28 mm Sorin 3D MEMO ring.   Noted by Dr. Cyndia Bent to need coumadin for 3 months and then can switch to Aspirin.   Seen for her post op visit back in September by Cecilie Kicks, NP - felt to be doing ok. Did not wish to go to cardiac rehab. I then saw her back in October - she was doing ok for the most part. She had been advised to stop her coumadin as of November 16th. She is already on baby aspirin. Noted elevated LFTs - concern for her use of alcohol - statin was stopped. Echo was updated - unfortunately, EF remains down. Discussed with Dr.  Marlou Porch - elected to try and maximize her medicines. Aldactone started. She will see him next month.   Comes in today. Here alone. She says she is ok. Still occasionally needs to take "double breath" but not really short of breath. Early satiety noted. No swelling. She says she is not drinking any alcohol. Her mother has what sounds like NASH. She is off her statin. Asking about exercise. No chest pain. Her echo was reviewed with her today.   Past Medical History:  Diagnosis Date  . Anemia   . Anxiety   . At risk for sudden cardiac death Nov 23, 2012  . CHF - Combined Systolic + Diastolic Dysfunction. EF of 15-20% with Grade II diastolic dysfunction on echo 10/27/12 10/28/2012  . Coronary artery disease   . Depression   . Dyspnea   . Fibroids   . Heart murmur   . Hypertension   . Hypothyroidism   . S/P cardiac catheterization, 2012-11-23, normal coronaries with minimal luminal irregularities in RCA system 10/30/2012  . Thyroid disease    hypothyroidism    Past Surgical History:  Procedure Laterality Date  . AORTIC VALVE REPLACEMENT N/A 03/12/2017   Procedure: AORTIC VALVE REPLACEMENT (AVR) with Mitral Annuloplasty;  Surgeon: Gaye Pollack, MD;  Location: [facility] OR;  Service: Open Heart Surgery;  Laterality: N/A;  . LEFT AND RIGHT HEART CATHETERIZATION WITH CORONARY ANGIOGRAM N/A 23-Nov-2012  Procedure: LEFT AND RIGHT HEART CATHETERIZATION WITH CORONARY ANGIOGRAM;  Surgeon: Leonie Man, MD;  Location: [facility] CATH LAB;  Service: Cardiovascular;  Laterality: N/A;  . MITRAL VALVE REPAIR N/A 03/12/2017   Procedure: MITRAL VALVE REPAIR (MVR);  Surgeon: Gaye Pollack, MD;  Location: [place];  Service: Open Heart Surgery;  Laterality: N/A;  mitral annuloplasty  . MULTIPLE EXTRACTIONS WITH ALVEOLOPLASTY N/A 01/21/2017   Procedure: Extraction of tooth #'s 4-13 with alveoloplasty;  Surgeon: Lenn Cal, DDS;  Location: [place];  Service: Oral Surgery;  Laterality: N/A;  . RIGHT/LEFT HEART CATH AND CORONARY  ANGIOGRAPHY N/A 01/09/2017   Procedure: Right/Left Heart Cath and Coronary Angiography;  Surgeon: Troy Sine, MD;  Location: [place] CV LAB;  Service: Cardiovascular;  Laterality: N/A;  . TEE WITHOUT CARDIOVERSION N/A 03/12/2017   Procedure: TRANSESOPHAGEAL ECHOCARDIOGRAM (TEE);  Surgeon: Gaye Pollack, MD;  Location: [place];  Service: Open Heart Surgery;  Laterality: N/A;     Medications: Current Meds  Medication Sig  . acetaminophen (TYLENOL) 500 MG tablet Take 500 mg by mouth daily as needed for headache (pain).   Marland Kitchen aspirin EC 81 MG EC tablet Take 1 tablet (81 mg total) by mouth daily.  Marland Kitchen buPROPion (WELLBUTRIN XL) 150 MG 24 hr tablet Take 1 tablet (150 mg total) by mouth daily.  . carvedilol (COREG) 6.25 MG tablet Take 1 tablet (6.25 mg total) by mouth 2 (two) times daily.  . Ferrous Fumarate (HEMOCYTE - 106 MG FE) 324 (106 Fe) MG TABS tablet Take 1 tablet (106 mg of iron total) by mouth 2 (two) times daily.  . fluticasone (FLONASE) 50 MCG/ACT nasal spray Place 1 spray into both nostrils daily as needed for allergies or rhinitis.  . furosemide (LASIX) 40 MG tablet Take 1 tablet (40 mg total) by mouth daily. Monday and thurday take 32m  . levothyroxine (SYNTHROID, LEVOTHROID) 112 MCG tablet TAKE ONE TABLET BY MOUTH ONCE DAILY BEFORE BREAKFAST  . lisinopril (PRINIVIL,ZESTRIL) 10 MG tablet Take 1 tablet (10 mg total) by mouth daily.  . mirtazapine (REMERON) 7.5 MG tablet Take 1 tablet (7.5 mg total) by mouth at bedtime.  . Multiple Vitamins-Minerals (ONE-A-DAY MENOPAUSE FORMULA) TABS Take 1 tablet by mouth daily.  . potassium chloride SA (K-DUR,KLOR-CON) 20 MEQ tablet Take 1 tablet (20 mEq total) by mouth daily.  .Marland Kitchenspironolactone (ALDACTONE) 25 MG tablet Take 0.5 tablets (12.5 mg total) daily by mouth.  . traZODone (DESYREL) 150 MG tablet Take 1 tablet (150 mg total) by mouth at bedtime.     Allergies: Allergies  Allergen Reactions  . Codeine Hives    Social History: The  patient  reports that she quit smoking about 38 years ago. Her smoking use included cigars and cigarettes. She has a 2.50 pack-year smoking history. she has never used smokeless tobacco. She reports that she drinks about 1.8 oz of alcohol per week. She reports that she uses drugs. Drug: Marijuana. Frequency: 5.00 times per week.   Family History: The patient's family history includes Arthritis in her paternal uncle; Autoimmune disease in her mother; Breast cancer in her cousin; Cancer in her maternal uncle and paternal grandfather; Hypertension in her father, maternal grandmother, and mother; Stroke in her father.   Review of Systems: Please see the history of present illness.   Otherwise, the review of systems is positive for none.   All other systems are reviewed and negative.   Physical Exam: VS:  BP (!) 142/90   Pulse 72  Ht 5' 4.5" (1.638 m)   Wt 143 lb (64.9 kg)   LMP 01/17/2015   BMI 24.17 kg/m  .  BMI Body mass index is 24.17 kg/m.  Wt Readings from Last 3 Encounters:  07/06/17 143 lb (64.9 kg)  06/01/17 137 lb 1.9 oz (62.2 kg)  05/07/17 132 lb (59.9 kg)    General: Pleasant. Alert and in no acute distress.  Her weight is up 5 pounds since my last visit.   HEENT: Normal.  Neck: Supple, no JVD, carotid bruits, or masses noted.  Cardiac: Regular rate and rhythm. Valve sounds ok.  No edema.  Respiratory:  Lungs are clear to auscultation bilaterally with normal work of breathing.  GI: Soft and nontender.  MS: No deformity or atrophy. Gait and ROM intact.  Skin: Warm and dry. Color is normal.  Neuro:  Strength and sensation are intact and no gross focal deficits noted.  Psych: Alert, appropriate and with normal affect.    LABORATORY DATA:  EKG:  EKG is not ordered today.  Lab Results  Component Value Date   WBC 6.9 06/01/2017   HGB 11.4 06/01/2017   HCT 34.7 06/01/2017   PLT 272 06/01/2017   GLUCOSE 91 06/17/2017   CHOL 138 06/01/2017   TRIG 84 06/01/2017   HDL 49  06/01/2017   LDLCALC 72 06/01/2017   ALT 82 (H) 06/17/2017   AST 97 (H) 06/17/2017   NA 140 06/17/2017   K 4.1 06/17/2017   CL 102 06/17/2017   CREATININE 0.76 06/17/2017   BUN 13 06/17/2017   CO2 25 06/17/2017   TSH 3.62 09/26/2016   INR 2.8 06/01/2017   HGBA1C 6.1 (H) 03/10/2017     BNP (last 3 results) No results for input(s): BNP in the last 8760 hours.  ProBNP (last 3 results) Recent Labs    11/27/16 0935 06/01/17 0931  PROBNP 5,337* 458*     Other Studies Reviewed Today:  Echo Study Conclusions 06/2017  - Left ventricle: The cavity size was normal. Wall thickness was   normal. Systolic function was moderately to severely reduced. The   estimated ejection fraction was in the range of 30% to 35%.   Moderate diffuse hypokinesis with distinct regional wall motion   abnormalities. Hypokinesis of the inferior myocardium. The study   is not technically sufficient to allow evaluation of LV diastolic   function. Doppler parameters are consistent with high ventricular   filling pressure. - Ventricular septum: Septal motion showed paradox. - Aortic valve: A bioprosthesis was present and functioning   normally. Mean gradient (S): 11 mm Hg. Valve area (VTI): 2.08   cm^2. - Mitral valve: S/P MV repair with annuloplasty ring. Moderate   diffuse thickening. There was trivial regurgitation directed   centrally. - Left atrium: The atrium was moderately to severely dilated. - Right atrium: The atrium was mildly dilated. - Pulmonic valve: There was trivial regurgitation.  Impressions:  - Compared to prior study, there is now a normally functioning AV   bioprosthesis and MV repair with trivial central MR.    Echo TEE 03/12/17 Result status: Final result   Left ventricle: Cavity is moderately dilated. LV systolic function is severely reduced with an EF of 25-30%. No thrombus present. No mass present.  Septum: No Patent Foramen Ovale present.  Left atrium: Patent  foramen ovale not present.  Left atrium: Cavity is dilated and dilated.  Aortic valve: The valve is trileaflet. Mild valve thickening present. No stenosis. Moderate to severe regurgitation.  There is flow reversal in the descending aorta.  Mitral valve: Non-specific thickening. Moderate regurgitation.  Right ventricle: Cavity is moderately dilated. Moderately reduced systolic function.  Right atrium: Cavity is dilated.  Tricuspid valve: Mild regurgitation. The tricuspid valve regurgitation jet is directed toward the right atrial free wall.  Pulmonic valve: Trace regurgitation.  Right ventricle: Normal cavity size. Mildly reduced systolic function.     Procedure:  1. Median Sternotomy 2. Extracorporeal circulation 3. Aortic valve replacement using a 23 mm Edwards Magna-Ease pericardialvalve. 4. Mitral valve annuloplasty using a 28 mm Sorin 3D MEMO ring.   Procedures   Right/Left Heart Cath and Coronary Angiography 01/2017  Conclusion     Ost RPDA to RPDA lesion, 50 %stenosed.  There is severe left ventricular systolic dysfunction.  Hemodynamic findings consistent with moderate pulmonary hypertension.  There is mild (2+) mitral regurgitation.  Dilated cardiomyopathy with severe global LV dysfunction and an ejection fraction of 15%. There appears to be at least mild to moderate mitral regurgitation.  Moderate elevation of right heart pressures with moderate pulmonary hypertension with a PA systolic pressure of 60 mmHg.  Mildly dilated aortic root with moderately severe aortic insufficiency.  Mild coronary obstructive disease with a normal LAD, normal small intermediate vessel, normal left circumflex, and a large dominant RCA with smooth 50% ostial PDA narrowing.  RECOMMENDATION: The patient will undergo follow-up outpatient evaluation with Dr. Cyndia Bent.     Assessment/Plan:  1. Severe AI - s/p AVR wth using a 23 mm Edwards Magna-Ease  pericardialvalve from 03/2017 - doing well - most recent echo noted. Unfortunately, her EF has not really recovered as well as what we would have expected.   2. Mitral regurgitation with MV annuloplasty using a 28 mm Sorin 3D MEMO ring 03/2017. Echo as above.   3. Cardiomyopathy, nonischemic on BB and ACE BP controlled. On diuretic. Weight is up - she probably does get too much salt. Will recheck her lab - hope to stop potassium and increase Aldactone once I see her labs. She notes dizziness with higher doses of Coreg. May need to consider Entresto. May need to consider ICD implant.   4. CAD non obstructive - manage medically. CV risk factor modification. No chest pain reported.   5. HLD - her statin has been stopped due to elevated LFTS. Rechecking today.   6. Iron def anemia followed by PCP   7. Chronic anticoagulation - completed total of 3 months - now just on aspirin   8. Elevated LFTs - rechecking today. Mother has NASH. She denies alcohol use at this time.    Current medicines are reviewed with the patient today.  The patient does not have concerns regarding medicines other than what has been noted above.  The following changes have been made:  See above.  Labs/ tests ordered today include:    Orders Placed This Encounter  Procedures  . Basic metabolic panel  . Hepatic function panel  . Pro b natriuretic peptide (BNP)     Disposition:   FU with Dr. Marlou Porch in one month as planned.    Patient is agreeable to this plan and will call if any problems develop in the interim.   SignedTruitt Merle, NP  07/06/2017 12:34 PM  [place] [address] [place], [place]  [address] Phone: [phone] Fax: [phone]

## 2017-07-07 ENCOUNTER — Encounter: Payer: Self-pay | Admitting: Gastroenterology

## 2017-07-07 ENCOUNTER — Telehealth: Payer: Self-pay | Admitting: *Deleted

## 2017-07-07 DIAGNOSIS — I429 Cardiomyopathy, unspecified: Secondary | ICD-10-CM

## 2017-07-07 DIAGNOSIS — Z79899 Other long term (current) drug therapy: Secondary | ICD-10-CM

## 2017-07-07 DIAGNOSIS — R945 Abnormal results of liver function studies: Secondary | ICD-10-CM

## 2017-07-07 DIAGNOSIS — R7989 Other specified abnormal findings of blood chemistry: Secondary | ICD-10-CM

## 2017-07-07 LAB — BASIC METABOLIC PANEL
BUN/Creatinine Ratio: 25 — ABNORMAL HIGH (ref 9–23)
BUN: 24 mg/dL (ref 6–24)
CO2: 23 mmol/L (ref 20–29)
Calcium: 9.5 mg/dL (ref 8.7–10.2)
Chloride: 104 mmol/L (ref 96–106)
Creatinine, Ser: 0.96 mg/dL (ref 0.57–1.00)
GFR calc Af Amer: 78 mL/min/{1.73_m2} (ref >59)
GFR calc non Af Amer: 68 mL/min/{1.73_m2} (ref >59)
Glucose: 91 mg/dL (ref 65–99)
Potassium: 4.6 mmol/L (ref 3.5–5.2)
Sodium: 139 mmol/L (ref 134–144)

## 2017-07-07 LAB — HEPATIC FUNCTION PANEL
ALT: 108 IU/L — ABNORMAL HIGH (ref 0–32)
AST: 135 IU/L — ABNORMAL HIGH (ref 0–40)
Albumin: 4 g/dL (ref 3.5–5.5)
Alkaline Phosphatase: 104 IU/L (ref 39–117)
Bilirubin Total: 0.3 mg/dL (ref 0.0–1.2)
Bilirubin, Direct: 0.07 mg/dL (ref 0.00–0.40)
Total Protein: 9.1 g/dL — ABNORMAL HIGH (ref 6.0–8.5)

## 2017-07-07 LAB — PRO B NATRIURETIC PEPTIDE: NT-Pro BNP: 240 pg/mL (ref 0–249)

## 2017-07-07 MED ORDER — SPIRONOLACTONE 25 MG PO TABS: 25.0000 mg | ORAL_TABLET | Freq: Every day | ORAL | 3 refills | Status: DC

## 2017-07-07 NOTE — Telephone Encounter (Signed)
-----   Message from Burtis Junes, NP sent at 07/07/2017  9:00 AM EST ----- Ok to report. Labs are stable - but her liver tests continue to rise. She is off statin.  Needs urgent GI referral for elevated LFTs - please ask if she has ever seen GI in the past.  Chemistries are stable - would stop potassium - increase Aldactone to a whole tablet daily (13m) BMET in one week.

## 2017-07-07 NOTE — Telephone Encounter (Signed)
Reviewed results with patient who verbalized understanding. Will place referral for GI, pt aware. (she has never seen a GI doc)  Pt agreeable to stop K+ supplementation and increase Aldactone to 25 mg daily. Updated Rx sent to [facility]. She will stop by the office 12/11 for follow up lab work.

## 2017-07-14 ENCOUNTER — Other Ambulatory Visit: Payer: Self-pay

## 2017-07-16 ENCOUNTER — Other Ambulatory Visit: Payer: PPO | Admitting: *Deleted

## 2017-07-16 DIAGNOSIS — I429 Cardiomyopathy, unspecified: Secondary | ICD-10-CM

## 2017-07-16 DIAGNOSIS — Z79899 Other long term (current) drug therapy: Secondary | ICD-10-CM

## 2017-07-16 LAB — BASIC METABOLIC PANEL
BUN/Creatinine Ratio: 21 (ref 9–23)
BUN: 18 mg/dL (ref 6–24)
CO2: 27 mmol/L (ref 20–29)
Calcium: 10 mg/dL (ref 8.7–10.2)
Chloride: 101 mmol/L (ref 96–106)
Creatinine, Ser: 0.85 mg/dL (ref 0.57–1.00)
GFR calc Af Amer: 90 mL/min/{1.73_m2} (ref >59)
GFR calc non Af Amer: 78 mL/min/{1.73_m2} (ref >59)
Glucose: 84 mg/dL (ref 65–99)
Potassium: 4 mmol/L (ref 3.5–5.2)
Sodium: 139 mmol/L (ref 134–144)

## 2017-07-24 ENCOUNTER — Other Ambulatory Visit: Payer: Self-pay | Admitting: Cardiology

## 2017-07-24 DIAGNOSIS — I5023 Acute on chronic systolic (congestive) heart failure: Secondary | ICD-10-CM

## 2017-08-13 ENCOUNTER — Ambulatory Visit: Payer: PPO | Admitting: Cardiology

## 2017-08-13 ENCOUNTER — Encounter: Payer: Self-pay | Admitting: Cardiology

## 2017-08-13 ENCOUNTER — Ambulatory Visit: Payer: Self-pay | Admitting: Cardiology

## 2017-08-13 VITALS — BP 120/82 | HR 76 | Ht 64.5 in | Wt 142.1 lb

## 2017-08-13 DIAGNOSIS — I428 Other cardiomyopathies: Secondary | ICD-10-CM | POA: Diagnosis not present

## 2017-08-13 DIAGNOSIS — R7989 Other specified abnormal findings of blood chemistry: Secondary | ICD-10-CM

## 2017-08-13 DIAGNOSIS — I5042 Chronic combined systolic (congestive) and diastolic (congestive) heart failure: Secondary | ICD-10-CM | POA: Diagnosis not present

## 2017-08-13 DIAGNOSIS — Z9889 Other specified postprocedural states: Secondary | ICD-10-CM | POA: Diagnosis not present

## 2017-08-13 DIAGNOSIS — Z952 Presence of prosthetic heart valve: Secondary | ICD-10-CM | POA: Diagnosis not present

## 2017-08-13 DIAGNOSIS — R945 Abnormal results of liver function studies: Secondary | ICD-10-CM | POA: Diagnosis not present

## 2017-08-13 MED ORDER — LISINOPRIL 20 MG PO TABS: 20.0000 mg | ORAL_TABLET | Freq: Every day | ORAL | 3 refills | Status: DC

## 2017-08-13 NOTE — Patient Instructions (Signed)
Medication Instructions:  Please increase your Lisinopril to 20 mg a day. Continue all other medications as listed.  Follow-Up: Follow up in 1 month with Dr Marlou Porch.  If you need a refill on your cardiac medications before your next appointment, please call your pharmacy.  Thank you for choosing [facility]!!

## 2017-08-13 NOTE — Progress Notes (Signed)
Cardiology Office Note:    Date:  08/13/2017   ID:  Miranda [name], DOB 01/18/1966, MRN [id number]  PCP:  Gayland Curry, DO  Cardiologist:  Candee Furbish, MD   Referring MD: Gayland Curry, DO     History of Present Illness:    Miranda [name] is a 52 y.o. female with a hx of AVR with Dr. Cyndia Bent on 03/12/2017 with 23 mm Edwards Magna-Ease pericardial valve and MV annuloplasty with 28 mm Sorin 3D MEMO ring on8/9/18 (23months of coumadin post then ASA). EF remained low post op from 15 to 35%. Also with elevated LFT's 135/100 AST/ ALT (7 2016, 30 on 01/24/17, 51 on 03/10/17 2 days pre-op, 72 on 06/01/17, 109 06/08/17, 108 07/06/17.)  Her EF was 30-35% on prior check.  November 2018.  We are trying to titrate her medications.  With carvedilol at 12.5 she was dizzy.  Lisinopril 10.  Spironolactone 25.  Feeling tired at times.  No significant shortness of breath.  No syncope, no bleeding.       Past Medical History:  Diagnosis Date  . Anemia   . Anxiety   . At risk for sudden cardiac death 11-04-12  . CHF - Combined Systolic + Diastolic Dysfunction. EF of 15-20% with Grade II diastolic dysfunction on echo 10/27/12 10/28/2012  . Coronary artery disease   . Depression   . Dyspnea   . Fibroids   . Heart murmur   . Hypertension   . Hypothyroidism   . S/P cardiac catheterization, 2012-11-04, normal coronaries with minimal luminal irregularities in RCA system 10/30/2012  . Thyroid disease    hypothyroidism    Past Surgical History:  Procedure Laterality Date  . AORTIC VALVE REPLACEMENT N/A 03/12/2017   Procedure: AORTIC VALVE REPLACEMENT (AVR) with Mitral Annuloplasty;  Surgeon: Gaye Pollack, MD;  Location: [facility];  Service: Open Heart Surgery;  Laterality: N/A;  . LEFT AND RIGHT HEART CATHETERIZATION WITH CORONARY ANGIOGRAM N/A 2012/11/04   Procedure: LEFT AND RIGHT HEART CATHETERIZATION WITH CORONARY ANGIOGRAM;  Surgeon: Leonie Man, MD;  [facility]: [facility] CATH LAB;  Service: Cardiovascular;   Laterality: N/A;  . MITRAL VALVE REPAIR N/A 03/12/2017   Procedure: MITRAL VALVE REPAIR (MVR);  Surgeon: Gaye Pollack, MD;  Location: [place];  Service: Open Heart Surgery;  Laterality: N/A;  mitral annuloplasty  . MULTIPLE EXTRACTIONS WITH ALVEOLOPLASTY N/A 01/21/2017   Procedure: Extraction of tooth #'s 4-13 with alveoloplasty;  Surgeon: Lenn Cal, DDS;  Location: [place];  Service: Oral Surgery;  Laterality: N/A;  . RIGHT/LEFT HEART CATH AND CORONARY ANGIOGRAPHY N/A 01/09/2017   Procedure: Right/Left Heart Cath and Coronary Angiography;  Surgeon: Troy Sine, MD;  Location: [place] CV LAB;  Service: Cardiovascular;  Laterality: N/A;  . TEE WITHOUT CARDIOVERSION N/A 03/12/2017   Procedure: TRANSESOPHAGEAL ECHOCARDIOGRAM (TEE);  Surgeon: Gaye Pollack, MD;  Location: [place];  Service: Open Heart Surgery;  Laterality: N/A;    Current Medications: Current Meds  Medication Sig  . acetaminophen (TYLENOL) 500 MG tablet Take 500 mg by mouth daily as needed for headache (pain).   Marland Kitchen aspirin EC 81 MG EC tablet Take 1 tablet (81 mg total) by mouth daily.  Marland Kitchen buPROPion (WELLBUTRIN XL) 150 MG 24 hr tablet Take 1 tablet (150 mg total) by mouth daily.  . carvedilol (COREG) 6.25 MG tablet Take 1 tablet (6.25 mg total) by mouth 2 (two) times daily.  . Ferrous Fumarate (HEMOCYTE - 106 MG FE) 324 (106 Fe)  MG TABS tablet Take 1 tablet (106 mg of iron total) by mouth 2 (two) times daily.  . fluticasone (FLONASE) 50 MCG/ACT nasal spray Place 1 spray into both nostrils daily as needed for allergies or rhinitis.  . furosemide (LASIX) 40 MG tablet Take 1 tablet (40 mg total) by mouth daily. Monday and thurday take 60mg   . levothyroxine (SYNTHROID, LEVOTHROID) 112 MCG tablet TAKE ONE TABLET BY MOUTH ONCE DAILY BEFORE BREAKFAST  . lisinopril (PRINIVIL,ZESTRIL) 20 MG tablet Take 1 tablet (20 mg total) by mouth daily.  . mirtazapine (REMERON) 7.5 MG tablet Take 1 tablet (7.5 mg total) by mouth at bedtime.  .  Multiple Vitamins-Minerals (ONE-A-DAY MENOPAUSE FORMULA) TABS Take 1 tablet by mouth daily.  Marland Kitchen spironolactone (ALDACTONE) 25 MG tablet Take 1 tablet (25 mg total) by mouth daily.  . traZODone (DESYREL) 150 MG tablet Take 1 tablet (150 mg total) by mouth at bedtime.  . [DISCONTINUED] lisinopril (PRINIVIL,ZESTRIL) 10 MG tablet Take 1 tablet (10 mg total) by mouth daily.     Allergies:   Codeine   Social History   Socioeconomic History  . Marital status: Single    Spouse name: None  . Number of children: 1  . Years of education: 12th  . Highest education level: None  Social Needs  . Financial resource strain: None  . Food insecurity - worry: None  . Food insecurity - inability: None  . Transportation needs - medical: None  . Transportation needs - non-medical: None  Occupational History  . Occupation: unemployed    Employer: [employer]  Tobacco Use  . Smoking status: Former Smoker    Packs/day: 0.50    Years: 5.00    Pack years: 2.50    Types: Cigars, Cigarettes    Last attempt to quit: 08/04/1978    Years since quitting: 39.0  . Smokeless tobacco: Never Used  . Tobacco comment: 1 cigar daily-quit 10/24/12    Substance and Sexual Activity  . Alcohol use: Yes    Alcohol/week: 1.8 oz    Types: 3 Glasses of wine per week    Comment: a week  . Drug use: Yes    Frequency: 5.0 times per week    Types: Marijuana    Comment: few weeks ago last  . Sexual activity: Not Currently  Other Topics Concern  . None  Social History Narrative   Patient lives at home alone. (Condo)   Caffeine use: 1/2 soda daily   Diet   Martial status:single   Is it one or more stories? Yes   How many persons live in your home? 1   Do you have any pets in your home? No   Current or past profession: Biochemist, clinical   Do you exercise? No   Do you have a living will? No    Do you have a DNR form? No    Do you have signed POA/HPOA forms? No      Family History: The patient's family history  includes Arthritis in her paternal uncle; Autoimmune disease in her mother; Breast cancer in her cousin; Cancer in her maternal uncle and paternal grandfather; Hypertension in her father, maternal grandmother, and mother; Stroke in her father.  ROS:   Please see the history of present illness.     All other systems reviewed and are negative.  EKGs/Labs/Other Studies Reviewed:    The following studies were reviewed today: Prior hospital records, lab work, EKG reviewed  EKG:  EKG is not ordered today.  Recent Labs: 09/26/2016: TSH 3.62 03/13/2017: Magnesium 2.0 06/01/2017: Hemoglobin 11.4; Platelets 272 07/06/2017: ALT 108; NT-Pro BNP 240 07/16/2017: BUN 18; Creatinine, Ser 0.85; Potassium 4.0; Sodium 139  Recent Lipid Panel    Component Value Date/Time   CHOL 138 06/01/2017 0931   TRIG 84 06/01/2017 0931   HDL 49 06/01/2017 0931   CHOLHDL 2.8 06/01/2017 0931   CHOLHDL 2.7 09/26/2016 1019   VLDL 11 09/26/2016 1019   LDLCALC 72 06/01/2017 0931    Physical Exam:    VS:  BP 120/82   Pulse 76   Ht 5' 4.5" (1.638 m)   Wt 142 lb 1.9 oz (64.5 kg)   LMP 01/17/2015   SpO2 99%   BMI 24.02 kg/m     Wt Readings from Last 3 Encounters:  08/13/17 142 lb 1.9 oz (64.5 kg)  07/06/17 143 lb (64.9 kg)  06/01/17 137 lb 1.9 oz (62.2 kg)     GEN:  Well nourished, well developed in no acute distress HEENT: Normal NECK: No JVD; No carotid bruits LYMPHATICS: No lymphadenopathy CARDIAC: RRR, no murmurs, rubs, gallops RESPIRATORY:  Clear to auscultation without rales, wheezing or rhonchi  ABDOMEN: Soft, non-tender, non-distended MUSCULOSKELETAL:  No edema; No deformity  SKIN: Warm and dry NEUROLOGIC:  Alert and oriented x 3 PSYCHIATRIC:  Normal affect   ASSESSMENT:    1. NICM (nonischemic cardiomyopathy) ([place])   2. Chronic combined systolic and diastolic congestive heart failure ([place])   3. Aortic valve replaced   4. S/P mitral valve repair   5. Elevated LFTs    PLAN:    In  order of problems listed above:  Chronic systolic heart failure EF 30-35%, nonischemic - Increasing lisinopril from 10-20 mg.  Continue with carvedilol 6.25 mg twice a day.  Felt dizzy on 12.5.  Continue with Spironolactone 25 mg a day.  On Lasix 40 mg daily with Monday and Thursday taking 60. -In March 2019, we will likely repeat limited echocardiogram to look at echo EF.  If EF remains low, we discussed referral for ICD.  Status post aortic valve replacement, mitral valve repair -Dental antibiotics  Abnormal LFTs - LFTs have been increasing recently.  Just prior to surgery they were elevated as above in HPI.  They continue to elevate and now seem to plateau with ALT around 100.  She is not on statin therapy.  She is not taking acetaminophen.  She had minimal alcohol over holiday season but does not drink daily.  She is seeing gastroenterology later this month, January.  We will see her back in 1 month.   Medication Adjustments/Labs and Tests Ordered: Current medicines are reviewed at length with the patient today.  Concerns regarding medicines are outlined above.  No orders of the defined types were placed in this encounter.  Meds ordered this encounter  Medications  . lisinopril (PRINIVIL,ZESTRIL) 20 MG tablet    Sig: Take 1 tablet (20 mg total) by mouth daily.    Dispense:  90 tablet    Refill:  3    Signed, Candee Furbish, MD  08/13/2017 9:26 AM     [facility]

## 2017-08-27 ENCOUNTER — Ambulatory Visit (INDEPENDENT_AMBULATORY_CARE_PROVIDER_SITE_OTHER): Payer: PPO | Admitting: Gastroenterology

## 2017-08-27 ENCOUNTER — Other Ambulatory Visit (INDEPENDENT_AMBULATORY_CARE_PROVIDER_SITE_OTHER): Payer: PPO

## 2017-08-27 ENCOUNTER — Encounter: Payer: Self-pay | Admitting: Gastroenterology

## 2017-08-27 VITALS — BP 108/70 | HR 76 | Ht 64.5 in | Wt 141.0 lb

## 2017-08-27 DIAGNOSIS — R748 Abnormal levels of other serum enzymes: Secondary | ICD-10-CM

## 2017-08-27 DIAGNOSIS — K5900 Constipation, unspecified: Secondary | ICD-10-CM

## 2017-08-27 LAB — HEPATIC FUNCTION PANEL
ALT: 36 U/L — ABNORMAL HIGH (ref 0–35)
AST: 50 U/L — ABNORMAL HIGH (ref 0–37)
Albumin: 4 g/dL (ref 3.5–5.2)
Alkaline Phosphatase: 82 U/L (ref 39–117)
Bilirubin, Direct: 0 mg/dL (ref 0.0–0.3)
Total Bilirubin: 0.3 mg/dL (ref 0.2–1.2)
Total Protein: 9.7 g/dL — ABNORMAL HIGH (ref 6.0–8.3)

## 2017-08-27 LAB — PROTIME-INR
INR: 1.1 ratio — ABNORMAL HIGH (ref 0.8–1.0)
Prothrombin Time: 11.4 s (ref 9.6–13.1)

## 2017-08-27 NOTE — Patient Instructions (Signed)
If you are age 54 or older, your body mass index should be between 23-30. Your Body mass index is 23.83 kg/m. If this is out of the aforementioned range listed, please consider follow up with your Primary Care Provider.  If you are age 36 or younger, your body mass index should be between 19-25. Your Body mass index is 23.83 kg/m. If this is out of the aformentioned range listed, please consider follow up with your Primary Care Provider.   Please go to the lab in the basement of our building to have lab work done as you leave today.  You have been scheduled for an abdominal ultrasound at [facility] (1st floor of hospital) on Monday 08-31-17 at 11:30am. Please arrive 15 minutes prior to your appointment for registration. Make certain not to have anything to eat or drink 6 hours prior to your appointment. Should you need to reschedule your appointment, please contact radiology at [phone]. This test typically takes about 30 minutes to perform.  Thank you for entrusting me with your care and for choosing [facility], Dr. [place] Cellar

## 2017-08-27 NOTE — Progress Notes (Signed)
HPI :  52 y/o female with a history of CHF (last EF 30-35%), hypothyroidism, depression, referred here by Dr. Hollace Kinnier for further evaluation of elevated liver enzymes.   On review of chart she has had an AST / ALT elevation dating back to June 2018. More recently she has had uptrend with AST and ALT in low 100s with normal AP and bili, with elevated total protein. She denies any history of known liver disease. She does have a family history of liver disease, her mother has autoimmune hepatitis. The patient has had a positive ANA remotely, and normal iron saturation in 2016. She denies any history of jaundice, no LE edema or known ascites. She states aldactone is a new medication for her since November. She denies any herbal supplements or over-the-counter supplements. She denies any tobacco use. She drinks about 3-4 alcoholic beverages per month.   Denies any family history of colon cancer. She does have some constipation which bothers her intermittently. She denies any blood in her stools. She denies any weight loss. She does not take anything for her bowels at this present time.  Cologuard 02/14/2016 - negative  Labs 07/06/17 - AST 135, ALT 108, T prot 9.1, T bil 0.3, AP 104  Iron sat 2016 - 18%, ferritin 86 (+) ANA 2014 to 1:80 to 1:160    Past Medical History:  Diagnosis Date  . Anemia   . Anxiety   . At risk for sudden cardiac death 11-09-2012  . CHF - Combined Systolic + Diastolic Dysfunction. EF of 15-20% with Grade II diastolic dysfunction on echo 10/27/12 10/28/2012  . Coronary artery disease   . Depression   . Dyspnea   . Fibroids   . Heart murmur   . Hypertension   . Hypothyroidism   . S/P cardiac catheterization, November 09, 2012, normal coronaries with minimal luminal irregularities in RCA system 10/30/2012  . Thyroid disease    hypothyroidism     Past Surgical History:  Procedure Laterality Date  . AORTIC VALVE REPLACEMENT N/A 03/12/2017   Procedure: AORTIC VALVE  REPLACEMENT (AVR) with Mitral Annuloplasty;  Surgeon: Gaye Pollack, MD;  Location: [facility];  Service: Open Heart Surgery;  Laterality: N/A;  . LEFT AND RIGHT HEART CATHETERIZATION WITH CORONARY ANGIOGRAM N/A 11-09-2012   Procedure: LEFT AND RIGHT HEART CATHETERIZATION WITH CORONARY ANGIOGRAM;  Surgeon: Leonie Man, MD;  Location[facility] CATH LAB;  Service: Cardiovascular;  Laterality: N/A;  . MITRAL VALVE REPAIR N/A 03/12/2017   Procedure: MITRAL VALVE REPAIR (MVR);  Surgeon: Gaye Pollack, MD;  Location: [place];  Service: Open Heart Surgery;  Laterality: N/A;  mitral annuloplasty  . MULTIPLE EXTRACTIONS WITH ALVEOLOPLASTY N/A 01/21/2017   Procedure: Extraction of tooth #'s 4-13 with alveoloplasty;  Surgeon: Lenn Cal, DDS;  Location: [place];  Service: Oral Surgery;  Laterality: N/A;  . RIGHT/LEFT HEART CATH AND CORONARY ANGIOGRAPHY N/A 01/09/2017   Procedure: Right/Left Heart Cath and Coronary Angiography;  Surgeon: Troy Sine, MD;  Location: [place] CV LAB;  Service: Cardiovascular;  Laterality: N/A;  . TEE WITHOUT CARDIOVERSION N/A 03/12/2017   Procedure: TRANSESOPHAGEAL ECHOCARDIOGRAM (TEE);  Surgeon: Gaye Pollack, MD;  Location: [place];  Service: Open Heart Surgery;  Laterality: N/A;   Family History  Problem Relation Age of [place]  . Bone cancer Paternal Grandfather   . Hypertension Mother   . Liver disease Mother        autoimmune  . Heart disease Mother   . Colon  polyps Mother   . Stroke Father   . Hypertension Father   . Hypertension Maternal Grandmother   . Heart disease Maternal Grandmother   . Breast cancer Cousin   . Bone cancer Maternal Uncle   . Arthritis Paternal Uncle    Social History   Tobacco Use  . Smoking status: Former Smoker    Packs/day: 0.50    Years: 5.00    Pack years: 2.50    Types: Cigars, Cigarettes    Last attempt to quit: 08/04/1978    Years since quitting: 39.0  . Smokeless tobacco: Never Used  . Tobacco comment: 1 cigar daily-quit  10/24/12    Substance Use Topics  . Alcohol use: Yes    Alcohol/week: 1.8 oz    Types: 3 Glasses of wine per week    Comment: per month  . Drug use: Yes    Frequency: 5.0 times per week    Types: Marijuana    Comment: few weeks ago last   Current Outpatient Medications  Medication Sig Dispense Refill  . aspirin EC 81 MG EC tablet Take 1 tablet (81 mg total) by mouth daily.    Marland Kitchen buPROPion (WELLBUTRIN XL) 150 MG 24 hr tablet Take 1 tablet (150 mg total) by mouth daily. 30 tablet 5  . carvedilol (COREG) 6.25 MG tablet Take 1 tablet (6.25 mg total) by mouth 2 (two) times daily. 180 tablet 1  . Ferrous Fumarate (HEMOCYTE - 106 MG FE) 324 (106 Fe) MG TABS tablet Take 1 tablet (106 mg of iron total) by mouth 2 (two) times daily. 180 tablet 3  . fluticasone (FLONASE) 50 MCG/ACT nasal spray Place 1 spray into both nostrils daily as needed for allergies or rhinitis. 16 g 2  . furosemide (LASIX) 40 MG tablet Take 1 tablet (40 mg total) by mouth daily. Monday and thurday take 60mg  90 tablet 3  . levothyroxine (SYNTHROID, LEVOTHROID) 112 MCG tablet TAKE ONE TABLET BY MOUTH ONCE DAILY BEFORE BREAKFAST 30 tablet 4  . lisinopril (PRINIVIL,ZESTRIL) 20 MG tablet Take 1 tablet (20 mg total) by mouth daily. 90 tablet 3  . mirtazapine (REMERON) 7.5 MG tablet Take 1 tablet (7.5 mg total) by mouth at bedtime. 90 tablet 3  . Multiple Vitamins-Minerals (ONE-A-DAY MENOPAUSE FORMULA) TABS Take 1 tablet by mouth daily.    Marland Kitchen spironolactone (ALDACTONE) 25 MG tablet Take 1 tablet (25 mg total) by mouth daily. 90 tablet 3  . traZODone (DESYREL) 150 MG tablet Take 1 tablet (150 mg total) by mouth at bedtime. 90 tablet 3   No current facility-administered medications for this visit.    Allergies  Allergen Reactions  . Codeine Hives     Review of Systems: All systems reviewed and negative except where noted in HPI.   Lab Results  Component Value Date   WBC 6.9 06/01/2017   HGB 11.4 06/01/2017   HCT 34.7  06/01/2017   MCV 82 06/01/2017   PLT 272 06/01/2017    Lab Results  Component Value Date   CREATININE 0.85 07/16/2017   BUN 18 07/16/2017   NA 139 07/16/2017   K 4.0 07/16/2017   CL 101 07/16/2017   CO2 27 07/16/2017    Lab Results  Component Value Date   ALT 36 (H) 08/27/2017   AST 50 (H) 08/27/2017   ALKPHOS 82 08/27/2017   BILITOT 0.3 08/27/2017     Physical Exam: BP 108/70   Pulse 76   Ht 5' 4.5" (1.638 m)   [facility]  141 lb (64 kg)   LMP 01/17/2015   BMI 23.83 kg/m  Constitutional: Pleasant, female in no acute distress. HEENT: Normocephalic and atraumatic. Conjunctivae are normal. No scleral icterus. Neck supple.  Cardiovascular: Normal rate, regular rhythm.  Pulmonary/chest: Effort normal and breath sounds normal. No wheezing, rales or rhonchi. Abdominal: Soft, nondistended, nontender.  There are no masses palpable. No appreciable hepatomegaly. Extremities: no edema Lymphadenopathy: No cervical adenopathy noted. Neurological: Alert and oriented to person place and time. Skin: Skin is warm and dry. No rashes noted. Psychiatric: Normal mood and affect. Behavior is normal.   ASSESSMENT AND PLAN: 52 year old female here for new patient assessment for the following issues:  Elevated liver enzymes - appears to have moderate AST and ALT elevation to low 100s dating back at least 7 months or so. I discussed the differential with her. She warrants further evaluation with screening labs for chronic liver diseases. Prior iron studies normal, no evidence of hemochromatosis. Given her mother's history, her elevated total protein, and the patient's prior history of positive ANA, we need to rule out autoimmune hepatitis. We'll also refer her for an ultrasound of the liver to evaluate the parenchyma. Pending the results of these labs, liver biopsy may also be considered. Once I have the results of the labs she will be contacted with further recommendations. Of note I repeated LFTs  today, AST and ALT both elevated but much lower than previous. She should minimize her alcohol intake at this time.  Constipation - discussed options for therapy, patient declines any laxatives or fiber supplement at this time, states symptoms are mild and controlled with diet. Her colon cancer screening was done with Cologuard and negative 02/2016 - she will need screening again in 02/2019.   [place] Cellar, MD [facility] Pager [phone]  CC: Gayland Curry, DO

## 2017-08-31 ENCOUNTER — Ambulatory Visit (HOSPITAL_COMMUNITY): Payer: PPO

## 2017-08-31 ENCOUNTER — Ambulatory Visit (HOSPITAL_COMMUNITY)
Admission: RE | Admit: 2017-08-31 | Discharge: 2017-08-31 | Disposition: A | Discharge status: Home / Self Care | Payer: PPO | Source: Ambulatory Visit | Attending: Gastroenterology | Admitting: Gastroenterology

## 2017-08-31 DIAGNOSIS — K8020 Calculus of gallbladder without cholecystitis without obstruction: Secondary | ICD-10-CM | POA: Diagnosis not present

## 2017-08-31 DIAGNOSIS — R748 Abnormal levels of other serum enzymes: Secondary | ICD-10-CM | POA: Diagnosis present

## 2017-08-31 DIAGNOSIS — K5900 Constipation, unspecified: Secondary | ICD-10-CM | POA: Diagnosis present

## 2017-08-31 LAB — ANA: Anti Nuclear Antibody(ANA): POSITIVE — AB

## 2017-08-31 LAB — HEPATITIS C ANTIBODY
Hepatitis C Ab: NONREACTIVE
SIGNAL TO CUT-OFF: 0.12 (ref <1.00)

## 2017-08-31 LAB — IGG: IgG (Immunoglobin G), Serum: 3305 mg/dL — ABNORMAL HIGH (ref 694–1618)

## 2017-08-31 LAB — CERULOPLASMIN: Ceruloplasmin: 38 mg/dL (ref 18–53)

## 2017-08-31 LAB — HEPATITIS A ANTIBODY, TOTAL: Hepatitis A AB,Total: NONREACTIVE

## 2017-08-31 LAB — HEPATITIS B SURFACE ANTIBODY,QUALITATIVE: Hep B S Ab: NONREACTIVE

## 2017-08-31 LAB — ALPHA-1-ANTITRYPSIN: A-1 Antitrypsin, Ser: 140 mg/dL (ref 83–199)

## 2017-08-31 LAB — ANTI-SMOOTH MUSCLE ANTIBODY, IGG: Actin (Smooth Muscle) Antibody (IGG): 60 U — ABNORMAL HIGH (ref <20)

## 2017-08-31 LAB — ANTI-NUCLEAR AB-TITER (ANA TITER): ANA Titer 1: 1:1280 {titer} — ABNORMAL HIGH

## 2017-08-31 LAB — HEPATITIS B SURFACE ANTIGEN: Hepatitis B Surface Ag: NONREACTIVE

## 2017-09-04 ENCOUNTER — Other Ambulatory Visit: Payer: Self-pay

## 2017-09-04 DIAGNOSIS — R932 Abnormal findings on diagnostic imaging of liver and biliary tract: Secondary | ICD-10-CM

## 2017-09-04 DIAGNOSIS — R945 Abnormal results of liver function studies: Secondary | ICD-10-CM

## 2017-09-08 ENCOUNTER — Other Ambulatory Visit: Payer: PPO

## 2017-09-08 ENCOUNTER — Ambulatory Visit (INDEPENDENT_AMBULATORY_CARE_PROVIDER_SITE_OTHER): Payer: PPO | Admitting: Gastroenterology

## 2017-09-08 DIAGNOSIS — E039 Hypothyroidism, unspecified: Secondary | ICD-10-CM | POA: Diagnosis not present

## 2017-09-08 DIAGNOSIS — Z23 Encounter for immunization: Secondary | ICD-10-CM

## 2017-09-08 DIAGNOSIS — I5042 Chronic combined systolic (congestive) and diastolic (congestive) heart failure: Secondary | ICD-10-CM

## 2017-09-08 LAB — CBC WITH DIFFERENTIAL/PLATELET
Basophils Absolute: 22 cells/uL (ref 0–200)
Basophils Relative: 0.5 %
Eosinophils Absolute: 189 cells/uL (ref 15–500)
Eosinophils Relative: 4.3 %
HCT: 37.8 % (ref 35.0–45.0)
Hemoglobin: 12.1 g/dL (ref 11.7–15.5)
Lymphs Abs: 1712 cells/uL (ref 850–3900)
MCH: 26.4 pg — ABNORMAL LOW (ref 27.0–33.0)
MCHC: 32 g/dL (ref 32.0–36.0)
MCV: 82.5 fL (ref 80.0–100.0)
MPV: 10.7 fL (ref 7.5–12.5)
Monocytes Relative: 10 %
Neutro Abs: 2037 cells/uL (ref 1500–7800)
Neutrophils Relative %: 46.3 %
Platelets: 169 10*3/uL (ref 140–400)
RBC: 4.58 10*6/uL (ref 3.80–5.10)
RDW: 14.7 % (ref 11.0–15.0)
Total Lymphocyte: 38.9 %
WBC mixed population: 440 cells/uL (ref 200–950)
WBC: 4.4 10*3/uL (ref 3.8–10.8)

## 2017-09-08 LAB — TSH: TSH: 0.88 mIU/L

## 2017-09-10 ENCOUNTER — Ambulatory Visit (INDEPENDENT_AMBULATORY_CARE_PROVIDER_SITE_OTHER): Payer: PPO | Admitting: Internal Medicine

## 2017-09-10 ENCOUNTER — Encounter: Payer: Self-pay | Admitting: Internal Medicine

## 2017-09-10 VITALS — BP 124/80 | HR 76 | Temp 98.1°F | Ht 65.0 in | Wt 143.0 lb

## 2017-09-10 DIAGNOSIS — F3341 Major depressive disorder, recurrent, in partial remission: Secondary | ICD-10-CM | POA: Diagnosis not present

## 2017-09-10 DIAGNOSIS — I7300 Raynaud's syndrome without gangrene: Secondary | ICD-10-CM

## 2017-09-10 DIAGNOSIS — F5104 Psychophysiologic insomnia: Secondary | ICD-10-CM | POA: Diagnosis not present

## 2017-09-10 DIAGNOSIS — E039 Hypothyroidism, unspecified: Secondary | ICD-10-CM | POA: Diagnosis not present

## 2017-09-10 DIAGNOSIS — I5042 Chronic combined systolic (congestive) and diastolic (congestive) heart failure: Secondary | ICD-10-CM | POA: Diagnosis not present

## 2017-09-10 DIAGNOSIS — R748 Abnormal levels of other serum enzymes: Secondary | ICD-10-CM | POA: Diagnosis not present

## 2017-09-10 DIAGNOSIS — J449 Chronic obstructive pulmonary disease, unspecified: Secondary | ICD-10-CM

## 2017-09-10 MED ORDER — MIRTAZAPINE 15 MG PO TABS: 15.0000 mg | ORAL_TABLET | Freq: Every day | ORAL | 3 refills | Status: DC

## 2017-09-10 NOTE — Progress Notes (Signed)
Location:  [facility] clinic Provider:  Darcus Edds L. Mariea Clonts, D.O., C.M.D.  Code Status: full code Goals of Care:  Advanced Directives 03/26/2017  Does Patient Have a Medical Advance Directive? No  Does patient want to make changes to medical advance directive? -  Would patient like information on creating a medical advance directive? -  Pre-existing out of facility DNR order (yellow form or pink MOST form) -   Chief Complaint  Patient presents with  . Follow-up    4 months for med mgt,    . Medication Refill    No Refills    HPI: Patient is a 52 y.o. female seen today for medical management of chronic diseases.    Says she now has problems with her liver--seeing Dr. Havery Moros.  She is for a biopsy Monday.  Her mother has autoimmune hepatitis.  She's had a hepatitis A/B vaccine with GI.    Breathing is off and on here now.  She's on a new, different heart medication since the end of last year (aldactone).  Has another echo in March/april with Dr. Servando Snare.  Energy is up and down.  She says she's not sure if it's physical or mood-related.    She is having trouble sleeping even with her trazodone and remeron.  She woke at 2:30am and hasn't been back to sleep.  No auditory hallucinations lately.  Appetite remains off and on.  Will skip eating for 1-2 days except to nibble on things.  Weight is stable since December.    Saturday, even in the warm weather, she had a white finger tip.  Fingers in general stay cold.    Says her body temperature is running low like 95.7 at times.  TSH was normal.   Past Medical History:  Diagnosis Date  . Anemia   . Anxiety   . At risk for sudden cardiac death Nov 16, 2012  . CHF - Combined Systolic + Diastolic Dysfunction. EF of 15-20% with Grade II diastolic dysfunction on echo 10/27/12 10/28/2012  . Coronary artery disease   . Depression   . Dyspnea   . Fibroids   . Heart murmur   . Hypertension   . Hypothyroidism   . S/P cardiac catheterization, 16-Nov-2012,  normal coronaries with minimal luminal irregularities in RCA system 10/30/2012  . Thyroid disease    hypothyroidism    Past Surgical History:  Procedure Laterality Date  . AORTIC VALVE REPLACEMENT N/A 03/12/2017   Procedure: AORTIC VALVE REPLACEMENT (AVR) with Mitral Annuloplasty;  Surgeon: Gaye Pollack, MD;  [facility];  Service: Open Heart Surgery;  Laterality: N/A;  . LEFT AND RIGHT HEART CATHETERIZATION WITH CORONARY ANGIOGRAM N/A 2012-11-16   Procedure: LEFT AND RIGHT HEART CATHETERIZATION WITH CORONARY ANGIOGRAM;  Surgeon: Leonie Man, MD;  Location: [facility] CATH LAB;  Service: Cardiovascular;  Laterality: N/A;  . MITRAL VALVE REPAIR N/A 03/12/2017   Procedure: MITRAL VALVE REPAIR (MVR);  Surgeon: Gaye Pollack, MD;  Location: [place];  Service: Open Heart Surgery;  Laterality: N/A;  mitral annuloplasty  . MULTIPLE EXTRACTIONS WITH ALVEOLOPLASTY N/A 01/21/2017   Procedure: Extraction of tooth #'s 4-13 with alveoloplasty;  Surgeon: Lenn Cal, DDS;  Location: [place];  Service: Oral Surgery;  Laterality: N/A;  . RIGHT/LEFT HEART CATH AND CORONARY ANGIOGRAPHY N/A 01/09/2017   Procedure: Right/Left Heart Cath and Coronary Angiography;  Surgeon: Troy Sine, MD;  Location: [place] CV LAB;  Service: Cardiovascular;  Laterality: N/A;  . TEE WITHOUT CARDIOVERSION N/A 03/12/2017  Procedure: TRANSESOPHAGEAL ECHOCARDIOGRAM (TEE);  Surgeon: Gaye Pollack, MD;  Location: [place];  Service: Open Heart Surgery;  Laterality: N/A;    Allergies  Allergen Reactions  . Codeine Hives    Outpatient Encounter Medications as of 09/10/2017  Medication Sig  . aspirin EC 81 MG EC tablet Take 1 tablet (81 mg total) by mouth daily.  Marland Kitchen buPROPion (WELLBUTRIN XL) 150 MG 24 hr tablet Take 1 tablet (150 mg total) by mouth daily.  . carvedilol (COREG) 6.25 MG tablet Take 1 tablet (6.25 mg total) by mouth 2 (two) times daily.  . ferrous sulfate 325 (65 FE) MG EC tablet Take 325 mg by mouth 2 (two) times  daily.  . fluticasone (FLONASE) 50 MCG/ACT nasal spray Place 1 spray into both nostrils daily as needed for allergies or rhinitis.  . furosemide (LASIX) 40 MG tablet Take 1 tablet (40 mg total) by mouth daily. Monday and thurday take 60mg   . levothyroxine (SYNTHROID, LEVOTHROID) 112 MCG tablet TAKE ONE TABLET BY MOUTH ONCE DAILY BEFORE BREAKFAST  . lisinopril (PRINIVIL,ZESTRIL) 20 MG tablet Take 1 tablet (20 mg total) by mouth daily.  . mirtazapine (REMERON) 7.5 MG tablet Take 1 tablet (7.5 mg total) by mouth at bedtime.  . Multiple Vitamins-Minerals (ONE-A-DAY MENOPAUSE FORMULA) TABS Take 1 tablet by mouth daily.  Marland Kitchen spironolactone (ALDACTONE) 25 MG tablet Take 1 tablet (25 mg total) by mouth daily.  . traZODone (DESYREL) 150 MG tablet Take 1 tablet (150 mg total) by mouth at bedtime.  . [DISCONTINUED] Ferrous Fumarate (HEMOCYTE - 106 MG FE) 324 (106 Fe) MG TABS tablet Take 1 tablet (106 mg of iron total) by mouth 2 (two) times daily.   No facility-administered encounter medications on file as of 09/10/2017.     Review of Systems:  Review of Systems  Constitutional: Positive for malaise/fatigue. Negative for chills and fever.  HENT: Negative for congestion and hearing loss.   Eyes: Negative for blurred vision.  Respiratory: Positive for shortness of breath. Negative for wheezing.   Cardiovascular: Negative for chest pain, palpitations and leg swelling.  Gastrointestinal: Negative for abdominal pain, blood in stool, constipation and melena.  Genitourinary: Negative for dysuria.  Musculoskeletal: Negative for falls.  Neurological: Negative for dizziness, loss of consciousness and weakness.  Psychiatric/Behavioral: Positive for depression. Negative for hallucinations, memory loss and substance abuse. The patient has insomnia.        Has a lot going on she reports    Health Maintenance  Topic Date Due  . MAMMOGRAM  02/26/2018  . PAP SMEAR  01/25/2019  . Fecal DNA (Cologuard)  02/14/2019   . TETANUS/TDAP  01/24/2026  . INFLUENZA VACCINE  Completed  . Hepatitis C Screening  Completed  . HIV Screening  Completed    Physical Exam: Vitals:   09/10/17 0805  BP: 124/80  Pulse: 76  Temp: 98.1 F (36.7 C)  TempSrc: Oral  SpO2: 97%  Weight: 143 lb (64.9 kg)  Height: 5\' 5"  (1.651 m)   Body mass index is 23.8 kg/m. Physical Exam  Constitutional: She is oriented to person, place, and time. No distress.  HENT:  Head: Normocephalic and atraumatic.  Cardiovascular: Normal rate, regular rhythm and intact distal pulses.  Murmur heard. Pulmonary/Chest: Effort normal and breath sounds normal. She has no rales.  Abdominal: Bowel sounds are normal.  Musculoskeletal: Normal range of motion.  Neurological: She is alert and oriented to person, place, and time.  Skin: Skin is warm and dry. Capillary refill takes less  than 2 seconds.  Psychiatric: She has a normal mood and affect.    Labs reviewed: Basic Metabolic Panel: Recent Labs    09/26/16 1019  03/12/17 1857  03/13/17 0306 03/13/17 1652  06/17/17 1121 07/06/17 1225 07/16/17 1058 09/08/17 0824  NA 140   < >  --    < > 136  --    < > 140 139 139  --   K 3.8   < >  --    < > 5.2*  --    < > 4.1 4.6 4.0  --   CL 108   < >  --    < > 107  --    < > 102 104 101  --   CO2 21   < >  --   --  23  --    < > 25 23 27   --   GLUCOSE 79   < >  --    < > 124*  --    < > 91 91 84  --   BUN 10   < >  --    < > 11  --    < > 13 24 18   --   CREATININE 0.86   < > 0.68   < > 0.65 0.66   < > 0.76 0.96 0.85  --   CALCIUM 8.9   < >  --   --  8.3*  --    < > 9.2 9.5 10.0  --   MG  --   --  2.9*  --  2.3 2.0  --   --   --   --   --   TSH 3.62  --   --   --   --   --   --   --   --   --  0.88   < > = values in this interval not displayed.   Liver Function Tests: Recent Labs    06/17/17 1121 07/06/17 1225 08/27/17 1001  AST 97* 135* 50*  ALT 82* 108* 36*  ALKPHOS 141* 104 82  BILITOT 0.2 0.3 0.3  PROT 8.6* 9.1* 9.7*  ALBUMIN 3.7  4.0 4.0   No results for input(s): LIPASE, AMYLASE in the last 8760 hours. No results for input(s): AMMONIA in the last 8760 hours. CBC: Recent Labs    11/27/16 0935  03/26/17 0959 06/01/17 0931 09/08/17 0824  WBC 6.1   < > 8.0 6.9 4.4  NEUTROABS 3.7  --  5,440  --  2,037  HGB 11.0*   < > 9.4* 11.4 12.1  HCT 36.8   < > 29.9* 34.7 37.8  MCV 94   < > 87.2 82 82.5  PLT 232   < > 482* 272 169   < > = values in this interval not displayed.   Lipid Panel: Recent Labs    09/26/16 1019 06/01/17 0931  CHOL 163 138  HDL 60 49  LDLCALC 92 72  TRIG 54 84  CHOLHDL 2.7 2.8   Lab Results  Component Value Date   HGBA1C 6.1 (H) 03/10/2017    Procedures since last visit: US Abdomen Complete  Result Date: 08/31/2017 CLINICAL DATA:  Elevated liver enzymes. EXAM: ABDOMEN ULTRASOUND COMPLETE COMPARISON:  None. FINDINGS: Gallbladder: Several small gallstones. Gallbladder wall is not thickened. Negative sonographic Murphy's sign. Common bile duct: Diameter: 4.2 mm, normal. Liver: No focal lesion identified. Within normal limits in parenchymal echogenicity. Portal vein is patent  on color Doppler imaging with normal direction of blood flow towards the liver. IVC: No abnormality visualized. Pancreas: Visualized portion unremarkable. Spleen: Size and appearance within normal limits. Right Kidney: Length: 11.6 cm. Echogenicity within normal limits. No mass or hydronephrosis visualized. Left Kidney: Length: 11.1 cm. Echogenicity within normal limits. No mass or hydronephrosis visualized. Abdominal aorta: No aneurysm visualized. Other findings: None. IMPRESSION: Several small stones in the gallbladder.  Otherwise, normal exam. Electronically Signed   By: Lorriane Shire M.D.   On: 08/31/2017 16:59    Assessment/Plan 1. Raynaud's phenomenon without gangrene -new onset, could be associated with autoimmune hepatitis that Dr. Havery Moros suspects--biopsy pending  2. Hypothyroidism, unspecified type -last  tsh wnl, cont current levothyroxine - TSH; Future  3. Abnormal transaminases - ongoing, now seeing GI for this and pending liver biopsy for autoimmune hepatitis - CBC with Differential/Platelet; Future - COMPLETE METABOLIC PANEL WITH GFR; Future  4. Psychophysiological insomnia - cont trazodone and increase remeron from 7.5mg  to 15mg  (may take two until uses up current supply) - mirtazapine (REMERON) 15 MG tablet; Take 1 tablet (15 mg total) by mouth at bedtime.  Dispense: 90 tablet; Refill: 3  5. Chronic obstructive pulmonary disease, unspecified COPD type ([place]) -not on current treatment  6. Recurrent major depressive disorder, in partial remission (HCC) -cont trazodone and increase remeron to see if it helps her sleep  7. Chronic combined systolic and diastolic congestive heart failure (HCC) -has been stable, followed closely with cardiology, Dr. Marlou Porch; had AVR with mitral annuloplasty in august - Lipid panel; Future  Labs/tests ordered:  Orders Placed This Encounter  Procedures  . TSH    Standing Status:   Future    Standing Expiration Date:   05/10/2018  . CBC with Differential/Platelet    Standing Status:   Future    Standing Expiration Date:   05/10/2018  . COMPLETE METABOLIC PANEL WITH GFR    Standing Status:   Future    Standing Expiration Date:   05/10/2018  . Lipid panel    Standing Status:   Future    Standing Expiration Date:   05/10/2018    Next appt: 01/11/2018 med mgt, labs before  Miosha Behe L. Hipolito Martinezlopez, D.O. [facility] [address], [place] [address] Cell Phone (Mon-Fri 8am-5pm):  [phone] On Call:  [phone] & follow prompts after 5pm & weekends Office Phone:  [phone] Office Fax:  [phone]

## 2017-09-11 ENCOUNTER — Other Ambulatory Visit: Payer: Self-pay | Admitting: Radiology

## 2017-09-14 ENCOUNTER — Ambulatory Visit (HOSPITAL_COMMUNITY)
Admission: RE | Admit: 2017-09-14 | Discharge: 2017-09-14 | Disposition: A | Discharge status: Home / Self Care | Payer: PPO | Source: Ambulatory Visit | Attending: Gastroenterology | Admitting: Gastroenterology

## 2017-09-14 ENCOUNTER — Encounter (HOSPITAL_COMMUNITY): Payer: Self-pay

## 2017-09-14 DIAGNOSIS — R945 Abnormal results of liver function studies: Secondary | ICD-10-CM | POA: Insufficient documentation

## 2017-09-14 DIAGNOSIS — D649 Anemia, unspecified: Secondary | ICD-10-CM | POA: Insufficient documentation

## 2017-09-14 DIAGNOSIS — F329 Major depressive disorder, single episode, unspecified: Secondary | ICD-10-CM | POA: Diagnosis not present

## 2017-09-14 DIAGNOSIS — Z885 Allergy status to narcotic agent status: Secondary | ICD-10-CM | POA: Insufficient documentation

## 2017-09-14 DIAGNOSIS — K753 Granulomatous hepatitis, not elsewhere classified: Secondary | ICD-10-CM | POA: Diagnosis not present

## 2017-09-14 DIAGNOSIS — R932 Abnormal findings on diagnostic imaging of liver and biliary tract: Secondary | ICD-10-CM | POA: Diagnosis not present

## 2017-09-14 DIAGNOSIS — I2510 Atherosclerotic heart disease of native coronary artery without angina pectoris: Secondary | ICD-10-CM | POA: Diagnosis not present

## 2017-09-14 DIAGNOSIS — K732 Chronic active hepatitis, not elsewhere classified: Secondary | ICD-10-CM | POA: Insufficient documentation

## 2017-09-14 DIAGNOSIS — I5032 Chronic diastolic (congestive) heart failure: Secondary | ICD-10-CM | POA: Insufficient documentation

## 2017-09-14 DIAGNOSIS — I110 Hypertensive heart disease with heart failure: Secondary | ICD-10-CM | POA: Diagnosis not present

## 2017-09-14 DIAGNOSIS — Z8371 Family history of colonic polyps: Secondary | ICD-10-CM | POA: Insufficient documentation

## 2017-09-14 DIAGNOSIS — E039 Hypothyroidism, unspecified: Secondary | ICD-10-CM | POA: Diagnosis not present

## 2017-09-14 DIAGNOSIS — F419 Anxiety disorder, unspecified: Secondary | ICD-10-CM | POA: Insufficient documentation

## 2017-09-14 LAB — CBC WITH DIFFERENTIAL/PLATELET
Basophils Absolute: 0 10*3/uL (ref 0.0–0.1)
Basophils Relative: 0 %
Eosinophils Absolute: 0.2 10*3/uL (ref 0.0–0.7)
Eosinophils Relative: 4 %
HCT: 41 % (ref 36.0–46.0)
Hemoglobin: 13.3 g/dL (ref 12.0–15.0)
Lymphocytes Relative: 39 %
Lymphs Abs: 1.5 10*3/uL (ref 0.7–4.0)
MCH: 27.8 pg (ref 26.0–34.0)
MCHC: 32.4 g/dL (ref 30.0–36.0)
MCV: 85.6 fL (ref 78.0–100.0)
Monocytes Absolute: 0.3 10*3/uL (ref 0.1–1.0)
Monocytes Relative: 7 %
Neutro Abs: 1.9 10*3/uL (ref 1.7–7.7)
Neutrophils Relative %: 50 %
Platelets: 166 10*3/uL (ref 150–400)
RBC: 4.79 MIL/uL (ref 3.87–5.11)
RDW: 15.3 % (ref 11.5–15.5)
WBC: 3.8 10*3/uL — ABNORMAL LOW (ref 4.0–10.5)

## 2017-09-14 LAB — PROTIME-INR
INR: 0.95
Prothrombin Time: 12.6 seconds (ref 11.4–15.2)

## 2017-09-14 MED ORDER — MIDAZOLAM HCL 2 MG/2ML IJ SOLN
INTRAMUSCULAR | Status: AC | PRN
  Administered 2017-09-14 (×2): 1 mg via INTRAVENOUS

## 2017-09-14 MED ORDER — ONDANSETRON HCL 4 MG/2ML IJ SOLN
4.0000 mg | Freq: Once | INTRAMUSCULAR | Status: AC
  Administered 2017-09-14: 4 mg via INTRAVENOUS
  Filled 2017-09-14: qty 2

## 2017-09-14 MED ORDER — LIDOCAINE HCL 1 % IJ SOLN
INTRAMUSCULAR | Status: AC | PRN
  Administered 2017-09-14: 20 mL

## 2017-09-14 MED ORDER — HYDROCODONE-ACETAMINOPHEN 5-325 MG PO TABS
1.0000 | ORAL_TABLET | Freq: Once | ORAL | Status: AC
  Administered 2017-09-14: 1 via ORAL
  Filled 2017-09-14: qty 1

## 2017-09-14 MED ORDER — LIDOCAINE HCL 1 % IJ SOLN
INTRAMUSCULAR | Status: AC
  Filled 2017-09-14: qty 10

## 2017-09-14 MED ORDER — FENTANYL CITRATE (PF) 100 MCG/2ML IJ SOLN
INTRAMUSCULAR | Status: AC
  Filled 2017-09-14: qty 2

## 2017-09-14 MED ORDER — GELATIN ABSORBABLE 12-7 MM EX MISC
CUTANEOUS | Status: DC
Start: 2017-09-14 — End: 2017-09-15
  Filled 2017-09-14: qty 1

## 2017-09-14 MED ORDER — FENTANYL CITRATE (PF) 100 MCG/2ML IJ SOLN
INTRAMUSCULAR | Status: AC | PRN
  Administered 2017-09-14 (×2): 50 ug via INTRAVENOUS

## 2017-09-14 MED ORDER — MIDAZOLAM HCL 2 MG/2ML IJ SOLN
INTRAMUSCULAR | Status: AC
  Filled 2017-09-14: qty 2

## 2017-09-14 MED ORDER — SODIUM CHLORIDE 0.9 % IV SOLN
INTRAVENOUS | Status: DC
  Administered 2017-09-14: 11:00:00 via INTRAVENOUS

## 2017-09-14 NOTE — Procedures (Signed)
US guided random liver biopsy. 3 cores obtained.  Gelfoam slurry injected as needle was removed.  Minimal blood loss and no immediate complication.

## 2017-09-14 NOTE — Discharge Instructions (Signed)
Moderate Conscious Sedation, Adult, Care After °These instructions provide you with information about caring for yourself after your procedure. Your health care provider may also give you more specific instructions. Your treatment has been planned according to current medical practices, but problems sometimes occur. Call your health care provider if you have any problems or questions after your procedure. °What can I expect after the procedure? °After your procedure, it is common: °· To feel sleepy for several hours. °· To feel clumsy and have poor balance for several hours. °· To have poor judgment for several hours. °· To vomit if you eat too soon. ° °Follow these instructions at home: °For at least 24 hours after the procedure: ° °· Do not: °? Participate in activities where you could fall or become injured. °? Drive. °? Use heavy machinery. °? Drink alcohol. °? Take sleeping pills or medicines that cause drowsiness. °? Make important decisions or sign legal documents. °? Take care of children on your own. °· Rest. °Eating and drinking °· Follow the diet recommended by your health care provider. °· If you vomit: °? Drink water, juice, or soup when you can drink without vomiting. °? Make sure you have little or no nausea before eating solid foods. °General instructions °· Have a responsible adult stay with you until you are awake and alert. °· Take over-the-counter and prescription medicines only as told by your health care provider. °· If you smoke, do not smoke without supervision. °· Keep all follow-up visits as told by your health care provider. This is important. °Contact a health care provider if: °· You keep feeling nauseous or you keep vomiting. °· You feel light-headed. °· You develop a rash. °· You have a fever. °Get help right away if: °· You have trouble breathing. °This information is not intended to replace advice given to you by your health care provider. Make sure you discuss any questions you have  with your health care provider. °Document Released: 05/11/2013 Document Revised: 12/24/2015 Document Reviewed: 11/10/2015 °Elsevier Interactive Patient Education © 2018 [employer] ° ° °Liver Biopsy, Care After °These instructions give you information on caring for yourself after your procedure. Your doctor may also give you more specific instructions. Call your doctor if you have any problems or questions after your procedure. °Follow these instructions at home: °· Rest at home for 1-2 days or as told by your doctor. °· Have someone stay with you for at least 24 hours. °· Do not do these things in the first 24 hours: °? Drive. °? Use machinery. °? Take care of other people. °? Sign legal documents. °? Take a bath or shower. °· There are many different ways to close and cover a cut (incision). For example, a cut can be closed with stitches, skin glue, or adhesive strips. Follow your doctor's instructions on: °? Taking care of your cut. °? Changing and removing your bandage (dressing). °? Removing whatever was used to close your cut. °· Do not drink alcohol in the first week. °· Do not lift more than 5 pounds or play contact sports for the first 2 weeks. °· Take medicines only as told by your doctor. For 1 week, do not take medicine that has aspirin in it or medicines like ibuprofen. °· Get your test results. °Contact a doctor if: °· A cut bleeds and leaves more than just a small spot of blood. °· A cut is red, puffs up (swells), or hurts more than before. °· Fluid or something else   comes from a cut. °· A cut smells bad. °· You have a fever or chills. °Get help right away if: °· You have swelling, bloating, or pain in your belly (abdomen). °· You get dizzy or faint. °· You have a rash. °· You feel sick to your stomach (nauseous) or throw up (vomit). °· You have trouble breathing, feel short of breath, or feel faint. °· Your chest hurts. °· You have problems talking or seeing. °· You have trouble balancing or moving  your arms or legs. °This information is not intended to replace advice given to you by your health care provider. Make sure you discuss any questions you have with your health care provider. °Document Released: 04/29/2008 Document Revised: 12/27/2015 Document Reviewed: 09/16/2013 °Elsevier Interactive Patient Education © 2018 Elsevier Inc. ° ° °

## 2017-09-14 NOTE — Progress Notes (Signed)
[facility] called to alert patient is having nausea and pain.  Her BP is 170/110s and P is 79.  She will be given zofran 4mg  as well as vicodin 5/325mg  to see if this helps her.  We will follow her closely.  Henreitta Cea 2:10 PM 09/14/2017

## 2017-09-14 NOTE — H&P (Signed)
Chief Complaint: elevated LFTs  Referring Physician:Dr. Redding Cellar  Supervising Physician: Markus Daft  Patient Status: [facility] - Out-pt  HPI: Miranda [name] is a 52 y.o. female with a history of CHF (s/p open heart surgery), HTN, anemia, who was recently found to have elevated LFTs.  She denies any abdominal pain, nausea, or vomiting.  She has been referred to GI for evaluation.  She had an [place] that revealed some changes c/w fibrosis.  Given her elevation in her labs as well as this [place] finding, a request for a random liver bx has been made.  Past Medical History:  Past Medical History:  Diagnosis Date  . Anemia   . Anxiety   . At risk for sudden cardiac death 11/06/12  . CHF - Combined Systolic + Diastolic Dysfunction. EF of 15-20% with Grade II diastolic dysfunction on echo 10/27/12 10/28/2012  . Coronary artery disease   . Depression   . Dyspnea   . Fibroids   . Heart murmur   . Hypertension   . Hypothyroidism   . S/P cardiac catheterization, 11/06/12, normal coronaries with minimal luminal irregularities in RCA system 10/30/2012  . Thyroid disease    hypothyroidism    Past Surgical History:  Past Surgical History:  Procedure Laterality Date  . AORTIC VALVE REPLACEMENT N/A 03/12/2017   Procedure: AORTIC VALVE REPLACEMENT (AVR) with Mitral Annuloplasty;  Surgeon: Gaye Pollack, MD;  Location: [facility] OR;  Service: Open Heart Surgery;  Laterality: N/A;  . LEFT AND RIGHT HEART CATHETERIZATION WITH CORONARY ANGIOGRAM N/A 2012/11/06   Procedure: LEFT AND RIGHT HEART CATHETERIZATION WITH CORONARY ANGIOGRAM;  Surgeon: Leonie Man, MD;  Location: [facility] CATH LAB;  Service: Cardiovascular;  Laterality: N/A;  . MITRAL VALVE REPAIR N/A 03/12/2017   Procedure: MITRAL VALVE REPAIR (MVR);  Surgeon: Gaye Pollack, MD;  Location: [place];  Service: Open Heart Surgery;  Laterality: N/A;  mitral annuloplasty  . MULTIPLE EXTRACTIONS WITH ALVEOLOPLASTY N/A 01/21/2017   Procedure: Extraction of tooth  #'s 4-13 with alveoloplasty;  Surgeon: Lenn Cal, DDS;  Location: [place];  Service: Oral Surgery;  Laterality: N/A;  . RIGHT/LEFT HEART CATH AND CORONARY ANGIOGRAPHY N/A 01/09/2017   Procedure: Right/Left Heart Cath and Coronary Angiography;  Surgeon: Troy Sine, MD;  Location: [place] CV LAB;  Service: Cardiovascular;  Laterality: N/A;  . TEE WITHOUT CARDIOVERSION N/A 03/12/2017   Procedure: TRANSESOPHAGEAL ECHOCARDIOGRAM (TEE);  Surgeon: Gaye Pollack, MD;  Location: [place];  Service: Open Heart Surgery;  Laterality: N/A;    Family History:  Family History  Problem Relation Age of Onset  . Bone cancer Paternal Grandfather   . Hypertension Mother   . Liver disease Mother        autoimmune  . Heart disease Mother   . Colon polyps Mother   . Stroke Father   . Hypertension Father   . Hypertension Maternal Grandmother   . Heart disease Maternal Grandmother   . Breast cancer Cousin   . Bone cancer Maternal Uncle   . Arthritis Paternal Uncle     Social History:  reports that she quit smoking about 39 years ago. Her smoking use included cigars and cigarettes. She has a 2.50 pack-year smoking history. she has never used smokeless tobacco. She reports that she drinks about 1.8 oz of alcohol per week. She reports that she uses drugs. Drug: Marijuana. Frequency: 5.00 times per week.  Allergies:  Allergies  Allergen Reactions  . Codeine Hives  Medications: Medications reviewed in epic  Please HPI for pertinent positives, otherwise complete 10 system ROS negative.  Mallampati Score: MD Evaluation Airway: WNL Heart: WNL Abdomen: WNL Chest/ Lungs: WNL ASA  Classification: 3 Mallampati/Airway Score: Two  Physical Exam: BP (!) 122/94 (BP Location: Right Arm)   Pulse 80   Temp 98 F (36.7 C) (Oral)   Resp 18   LMP 01/17/2015   SpO2 100%  There is no height or weight on file to calculate BMI. General: pleasant, WD, WN black female who is laying in bed in  NAD HEENT: head is normocephalic, atraumatic.  Sclera are noninjected.  PERRL.  Ears and nose without any masses or lesions.  Mouth is pink and moist Heart: regular, rate, and rhythm.  Normal s1,s2. No obvious murmurs, gallops, or rubs noted.  Palpable radial pulses bilaterally Lungs: CTAB, no wheezes, rhonchi, or rales noted.  Respiratory effort nonlabored Abd: soft, NT, ND, +BS, no masses, hernias, or organomegaly Psych: A&Ox3 with an appropriate affect.   Labs: Pending   Imaging: No results found.  Assessment/Plan 1. Elevated LFTs  Proceed today with a random liver biopsy given [place] changes and elevated LFTs.  Vitals reviewed.  Labs are currently pending. Risks and benefits discussed with the patient including, but not limited to bleeding, infection, damage to adjacent structures or low yield requiring additional tests.  All of the patient's questions were answered, patient is agreeable to proceed. Consent signed and in chart.   Thank you for this interesting consult.  I greatly enjoyed meeting Miranda [name] and look forward to participating in their care.  A copy of this report was sent to the requesting provider on this date.  Electronically Signed: Henreitta Cea 09/14/2017, 11:33 AM   I spent a total of  30 Minutes   in face to face in clinical consultation, greater than 50% of which was counseling/coordinating care for elevated LFTs

## 2017-09-17 ENCOUNTER — Other Ambulatory Visit: Payer: Self-pay

## 2017-09-18 ENCOUNTER — Other Ambulatory Visit: Payer: Self-pay

## 2017-09-18 DIAGNOSIS — R945 Abnormal results of liver function studies: Principal | ICD-10-CM

## 2017-09-18 DIAGNOSIS — R7989 Other specified abnormal findings of blood chemistry: Secondary | ICD-10-CM

## 2017-09-22 ENCOUNTER — Telehealth: Payer: Self-pay

## 2017-09-22 ENCOUNTER — Ambulatory Visit: Payer: PPO | Admitting: Cardiology

## 2017-09-22 ENCOUNTER — Encounter: Payer: Self-pay | Admitting: Cardiology

## 2017-09-22 VITALS — BP 148/98 | HR 72 | Ht 64.5 in | Wt 143.0 lb

## 2017-09-22 DIAGNOSIS — I5042 Chronic combined systolic (congestive) and diastolic (congestive) heart failure: Secondary | ICD-10-CM | POA: Diagnosis not present

## 2017-09-22 DIAGNOSIS — R7989 Other specified abnormal findings of blood chemistry: Secondary | ICD-10-CM

## 2017-09-22 DIAGNOSIS — R945 Abnormal results of liver function studies: Secondary | ICD-10-CM | POA: Diagnosis not present

## 2017-09-22 DIAGNOSIS — I428 Other cardiomyopathies: Secondary | ICD-10-CM | POA: Diagnosis not present

## 2017-09-22 MED ORDER — SACUBITRIL-VALSARTAN 24-26 MG PO TABS: 1.0000 | ORAL_TABLET | Freq: Two times a day (BID) | ORAL | 1 refills | Status: DC

## 2017-09-22 NOTE — Progress Notes (Signed)
Cardiology Office Note:    Date:  09/22/2017   ID:  Miranda [name], DOB [birth year], MRN [id number]  PCP:  Miranda [name]  Cardiologist:  Miranda Furbish, [name]   Referring [name]: Miranda [name]     History of Present Illness:    Miranda [name] is a 52 y.o. female with a hx of AVR with Miranda [name] on 03/12/2017 with 23 mm Edwards Magna-Ease pericardial valve and MV annuloplasty with 28 mm Sorin 3D MEMO ring on8/9/18 (73month of coumadin post then ASA). EF remained low post op from 15 to 35%. Also with elevated LFT's 135/100 AST/ ALT (7 2016, 30 on 01/24/17, 51 on 03/10/17 2 days pre-op, 72 on 06/01/17, 109 06/08/17, 108 07/06/17.)  Her EF was 30-35% on prior check.  November 2018.  We are trying to titrate her medications.  With carvedilol at 12.5 she was dizzy.  Lisinopril 10.  Spironolactone 25.  Feeling tired at times.  Minimal shortness of breath with activity, no syncope, no bleeding.  Depressed mood.  Her ALT has decreased.  She is still being worked up for possible autoimmune disease  Affecting her liver.       Past Medical History:  Diagnosis Date  . Anemia   . Anxiety   . At risk for sudden cardiac death 32014-04-21 . CHF - Combined Systolic + Diastolic Dysfunction. EF of 15-20% with Grade II diastolic dysfunction on echo 10/27/12 10/28/2012  . Coronary artery disease   . Depression   . Dyspnea   . Fibroids   . Heart murmur   . Hypertension   . Hypothyroidism   . S/P cardiac catheterization, 3Apr 21, 2014 normal coronaries with minimal luminal irregularities in RCA system 10/30/2012  . Thyroid disease    hypothyroidism    Past Surgical History:  Procedure Laterality Date  . AORTIC VALVE REPLACEMENT N/A 03/12/2017   Procedure: AORTIC VALVE REPLACEMENT (AVR) with Mitral Annuloplasty;  Surgeon: Miranda [name];  Location: [facility] OR;  Service: Open Heart Surgery;  Laterality: N/A;  . LEFT AND RIGHT HEART CATHETERIZATION WITH CORONARY ANGIOGRAM N/A 304/21/2014  Procedure: LEFT AND  RIGHT HEART CATHETERIZATION WITH CORONARY ANGIOGRAM;  Surgeon: Miranda [name];  [facility];  Service: Cardiovascular;  Laterality: N/A;  . MITRAL VALVE REPAIR N/A 03/12/2017   Procedure: MITRAL VALVE REPAIR (MVR);  Surgeon: Miranda [name];  [place]  Service: Open Heart Surgery;  Laterality: N/A;  mitral annuloplasty  . MULTIPLE EXTRACTIONS WITH ALVEOLOPLASTY N/A 01/21/2017   Procedure: Extraction of tooth #'s 4-13 with alveoloplasty;  Surgeon: Miranda [name];  [place]  Service: Oral Surgery;  Laterality: N/A;  . RIGHT/LEFT HEART CATH AND CORONARY ANGIOGRAPHY N/A 01/09/2017   Procedure: Right/Left Heart Cath and Coronary Angiography;  Surgeon: Miranda [name];  [facility];  Service: Cardiovascular;  Laterality: N/A;  . TEE WITHOUT CARDIOVERSION N/A 03/12/2017   Procedure: TRANSESOPHAGEAL ECHOCARDIOGRAM (TEE);  Surgeon: Miranda [name];  [place]  Service: Open Heart Surgery;  Laterality: N/A;    Current Medications: Current Meds  Medication Sig  . aspirin EC 81 MG EC tablet Take 1 tablet (81 mg total) by mouth daily.  .Marland KitchenbuPROPion (WELLBUTRIN XL) 150 MG 24 hr tablet Take 1 tablet (150 mg total) by mouth daily.  . carvedilol (COREG) 6.25 MG tablet Take 1 tablet (6.25 mg total) by mouth 2 (two) times daily.  . ferrous sulfate 325 (65 FE) MG  EC tablet Take 325 mg by mouth 2 (two) times daily.  . fluticasone (FLONASE) 50 MCG/ACT nasal spray Place 1 spray into both nostrils daily as needed for allergies or rhinitis.  . furosemide (LASIX) 40 MG tablet Take 1 tablet (40 mg total) by mouth daily. Monday and thurday take 33m  . levothyroxine (SYNTHROID, LEVOTHROID) 112 MCG tablet TAKE ONE TABLET BY MOUTH ONCE DAILY BEFORE BREAKFAST  . mirtazapine (REMERON) 15 MG tablet Take 1 tablet (15 mg total) by mouth at bedtime.  . Multiple Vitamins-Minerals (ONE-A-DAY MENOPAUSE FORMULA) TABS Take 1 tablet by mouth daily.  .Marland Kitchenspironolactone  (ALDACTONE) 25 MG tablet Take 1 tablet (25 mg total) by mouth daily.  . traZODone (DESYREL) 150 MG tablet Take 1 tablet (150 mg total) by mouth at bedtime.  . [DISCONTINUED] lisinopril (PRINIVIL,ZESTRIL) 20 MG tablet Take 1 tablet (20 mg total) by mouth daily.     Allergies:   Codeine   Social History   Socioeconomic History  . Marital status: Single    Spouse name: None  . Number of children: 1  . Years of education: 12th  . Highest education level: None  Social Needs  . Financial resource strain: None  . Food insecurity - worry: None  . Food insecurity - inability: None  . Transportation needs - medical: None  . Transportation needs - non-medical: None  Occupational History  . Occupation: unemployed    Employer: [employer]  Tobacco Use  . Smoking status: Former Smoker    Packs/day: 0.50    Years: 5.00    Pack years: 2.50    Types: Cigars, Cigarettes    Last attempt to quit: 08/04/1978    Years since quitting: 39.1  . Smokeless tobacco: Never Used  . Tobacco comment: 1 cigar daily-quit 10/24/12    Substance and Sexual Activity  . Alcohol use: Yes    Alcohol/week: 1.8 oz    Types: 3 Glasses of wine per week    Comment: per month  . Drug use: Yes    Frequency: 5.0 times per week    Types: Marijuana    Comment: few weeks ago last  . Sexual activity: Not Currently  Other Topics Concern  . None  Social History Narrative   Patient lives at home alone. (Condo)   Caffeine use: 1/2 soda daily   Diet   Martial status:single   Is it one or more stories? Yes   How many persons live in your home? 1   [name] you have any pets in your home? No   Current or past profession: WBiochemist, clinical  [name] you exercise? No   [name] you have a living will? No    [name] you have a DNR form? No    [name] you have signed POA/HPOA forms? No      Family History: The patient's family history includes Arthritis in her paternal uncle; Bone cancer in her maternal uncle and paternal grandfather; Breast  cancer in her cousin; Colon polyps in her mother; Heart disease in her maternal grandmother and mother; Hypertension in her father, maternal grandmother, and mother; Liver disease in her mother; Stroke in her father.  ROS:   Please see the history of present illness.    All other review of systems negative.  EKGs/Labs/Other Studies Reviewed:    The following studies were reviewed today: Prior hospital records, lab work, EKG reviewed  EKG:  EKG is not ordered today.   Recent Labs: 03/13/2017: Magnesium 2.0 07/06/2017: NT-Pro BNP 240  07/16/2017: BUN 18; Creatinine, Ser 0.85; Potassium 4.0; Sodium 139 08/27/2017: ALT 36 09/08/2017: TSH 0.88 09/14/2017: Hemoglobin 13.3; Platelets 166  Recent Lipid Panel    Component Value Date/Time   CHOL 138 06/01/2017 0931   TRIG 84 06/01/2017 0931   HDL 49 06/01/2017 0931   CHOLHDL 2.8 06/01/2017 0931   CHOLHDL 2.7 09/26/2016 1019   VLDL 11 09/26/2016 1019   LDLCALC 72 06/01/2017 0931    Physical Exam:    VS:  BP (!) 148/98   Pulse 72   Ht 5' 4.5" (1.638 m)   Wt 143 lb (64.9 kg)   LMP 01/17/2015   SpO2 96%   BMI 24.17 kg/m     Wt Readings from Last 3 Encounters:  09/22/17 143 lb (64.9 kg)  09/10/17 143 lb (64.9 kg)  08/27/17 141 lb (64 kg)     GEN: Well nourished, well developed, in no acute distress  HEENT: normal  Neck: no JVD, carotid bruits, or masses Cardiac: RRR; no murmurs, rubs, or gallops,no edema  Respiratory:  clear to auscultation bilaterally, normal work of breathing GI: soft, nontender, nondistended, + BS MS: no deformity or atrophy  Skin: warm and dry, no rash Neuro:  Alert and Oriented x 3, Strength and sensation are intact Psych: depressed mood, full affect   ASSESSMENT:    1. Chronic combined systolic and diastolic congestive heart failure ([place])   2. NICM (nonischemic cardiomyopathy) (HCC)   3. Elevated LFTs    PLAN:    In order of problems listed above:  Chronic systolic heart failure EF 30-35%,  nonischemic -Previously increase lisinopril to 20 mg.  Continue with carvedilol 6.25 mg twice a day.  Felt dizzy on 12.5.  Continue with Spironolactone 25 mg a day.  On Lasix 40 mg daily with Monday and Thursday taking 60.  She is tolerating the medication change well however her blood pressure still remains elevated.  I would like to switch her over to [facility] 24/26.  She will hold her lisinopril for 36 hours prior to administration.  At next clinic visit, we should increase her Entresto dose if her blood pressure can tolerate.  Check basic metabolic profile in 1 month as well. -Checking limited echocardiogram to look at echo EF.  If EF remains low, we discussed referral for ICD.  Status post aortic valve replacement, mitral valve repair -Dental antibiotics, no changes  Abnormal LFTs - LFTs, mild improvement on last assay.  ALT decreased from 100 down to 36.  No bilirubinemia.  Dr. Havery Moros is questioning whether or not she has an autoimmune situation.  We will see her back in 1 month with APP.   Medication Adjustments/Labs and Tests Ordered: Current medicines are reviewed at length with the patient today.  Concerns regarding medicines are outlined above.  Orders Placed This Encounter  Procedures  . ECHOCARDIOGRAM COMPLETE   Meds ordered this encounter  Medications  . sacubitril-valsartan (ENTRESTO) 24-26 MG    Sig: Take 1 tablet by mouth 2 (two) times daily.    Dispense:  60 tablet    Refill:  1    Signed, Miranda Furbish, [name]  09/22/2017 9:35 AM    [facility]

## 2017-09-22 NOTE — Telephone Encounter (Signed)
**Note De-identified  Obfuscation** I have done an Entresto PA through covermymeds. 

## 2017-09-22 NOTE — Patient Instructions (Signed)
Medication Instructions:  Your physician has recommended you make the following change in your medication:  1-STOP Lisinopril 2-START Enstresto 24/26 mg by mouth twice daily starting 09/24/17.   Labwork: NONE today  Testing/Procedures: Your physician has requested that you have a limited echocardiogram. Echocardiography is a painless test that uses sound waves to create images of your heart. It provides your doctor with information about the size and shape of your heart and how well your heart's chambers and valves are working. This procedure takes approximately one hour. There are no restrictions for this procedure.  Follow-Up: Your physician recommends that you schedule a follow-up appointment in: 1 month with Miranda Kicks NP.   Any Other Special Instructions Will Be Listed Below (If Applicable).     If you need a refill on your cardiac medications before your next appointment, please call your pharmacy.

## 2017-09-23 ENCOUNTER — Telehealth: Payer: Self-pay | Admitting: Cardiology

## 2017-09-23 NOTE — Telephone Encounter (Signed)
Approval received on the [place] PA via fax from [employer]. Approval good from 09/22/2017 until 08/03/2018.  I have notified the pts pharmacy.

## 2017-09-23 NOTE — Telephone Encounter (Signed)
Spoke with Herbie Baltimore (pharmacisit) who is aware pt was instructed to d/c Lisinopril 36 hrs prior to starting Entresto.  He was "just making sure."

## 2017-09-23 NOTE — Telephone Encounter (Signed)
Robert calling with [employer], states that there is a Horticulturist, commercial with Entresto and Lisinopril. Herbie Baltimore would like to verify that the Lisinopril was discontinued.

## 2017-09-25 ENCOUNTER — Telehealth: Payer: Self-pay

## 2017-09-25 ENCOUNTER — Other Ambulatory Visit: Payer: Self-pay | Admitting: Nurse Practitioner

## 2017-09-25 NOTE — Telephone Encounter (Signed)
Left vm for [facility] checking to see appointment for 2nd opinion has been scheduled yet. Asked that they call us back to let us know when it's scheduled.

## 2017-09-29 ENCOUNTER — Telehealth: Payer: Self-pay

## 2017-09-29 NOTE — Telephone Encounter (Signed)
Patient is scheduled for appointment on 2/27 at [facility] with [facility].

## 2017-09-30 DIAGNOSIS — R1011 Right upper quadrant pain: Secondary | ICD-10-CM | POA: Diagnosis not present

## 2017-09-30 DIAGNOSIS — K754 Autoimmune hepatitis: Secondary | ICD-10-CM | POA: Diagnosis not present

## 2017-10-08 ENCOUNTER — Telehealth: Payer: Self-pay | Admitting: Internal Medicine

## 2017-10-08 NOTE — Telephone Encounter (Signed)
I called the patient to schedule her AWV-S that's due in  April.  She said she will call me back to schedule it. VDM (DD)

## 2017-10-09 ENCOUNTER — Ambulatory Visit (INDEPENDENT_AMBULATORY_CARE_PROVIDER_SITE_OTHER): Payer: PPO | Admitting: Gastroenterology

## 2017-10-09 DIAGNOSIS — Z23 Encounter for immunization: Secondary | ICD-10-CM | POA: Diagnosis not present

## 2017-10-19 ENCOUNTER — Ambulatory Visit (HOSPITAL_COMMUNITY): Payer: PPO | Attending: Cardiovascular Disease

## 2017-10-19 ENCOUNTER — Other Ambulatory Visit: Payer: Self-pay

## 2017-10-19 DIAGNOSIS — I428 Other cardiomyopathies: Secondary | ICD-10-CM

## 2017-10-19 DIAGNOSIS — Z87891 Personal history of nicotine dependence: Secondary | ICD-10-CM | POA: Insufficient documentation

## 2017-10-19 DIAGNOSIS — I5042 Chronic combined systolic (congestive) and diastolic (congestive) heart failure: Secondary | ICD-10-CM

## 2017-10-19 DIAGNOSIS — R945 Abnormal results of liver function studies: Secondary | ICD-10-CM

## 2017-10-19 DIAGNOSIS — I340 Nonrheumatic mitral (valve) insufficiency: Secondary | ICD-10-CM | POA: Insufficient documentation

## 2017-10-19 DIAGNOSIS — R7989 Other specified abnormal findings of blood chemistry: Secondary | ICD-10-CM

## 2017-10-22 ENCOUNTER — Telehealth: Payer: Self-pay | Admitting: Cardiology

## 2017-10-22 NOTE — Telephone Encounter (Signed)
Spoke with pt RE: Miranda [name].  She reports that her co-pay is not too high but that her insurance will only cover up to $3,800/yr for her medications.  Advised to continue medications as listed for now and discuss further at her up coming appt as scheduled.  (3/26)  In reviewing medication insurance coverage pt's plan does pay up to $3,820 in phase one then she would be responsible for the cost up until $5,100 (Phase 2).  A gap of $1,280.  Once she reaches $5,100 (Phase 3) her cost for medication would be the cost for a covered drug - "either coninsurance or a copayment, whichever is the larger amount (either coninsurance for 5% of the cost of the drug, or $3.40 for a generic or a drug that his treated like a generic and $8.50 for all other drugs". This information was obtained from [employer] P[employer] -[email or web address]/.

## 2017-10-22 NOTE — Telephone Encounter (Signed)
New Message    Pt c/o medication issue:  1. Name of Medication:  Entresto   2. How are you currently taking this medication (dosage and times per day)? 1 tablet 2 x a day   3. Are you having a reaction (difficulty breathing--STAT)?  no  4. What is your medication issue? Patient called stating she can not use the Discount card for the [facility] because she has medicare , she wants you to give her something else

## 2017-10-27 ENCOUNTER — Ambulatory Visit: Payer: PPO | Admitting: Nurse Practitioner

## 2017-10-27 ENCOUNTER — Encounter: Payer: Self-pay | Admitting: Nurse Practitioner

## 2017-10-27 VITALS — BP 124/86 | HR 80 | Ht 64.5 in | Wt 147.8 lb

## 2017-10-27 DIAGNOSIS — Z9889 Other specified postprocedural states: Secondary | ICD-10-CM | POA: Diagnosis not present

## 2017-10-27 DIAGNOSIS — Z952 Presence of prosthetic heart valve: Secondary | ICD-10-CM | POA: Diagnosis not present

## 2017-10-27 DIAGNOSIS — I428 Other cardiomyopathies: Secondary | ICD-10-CM

## 2017-10-27 DIAGNOSIS — R945 Abnormal results of liver function studies: Secondary | ICD-10-CM | POA: Diagnosis not present

## 2017-10-27 DIAGNOSIS — I5042 Chronic combined systolic (congestive) and diastolic (congestive) heart failure: Secondary | ICD-10-CM

## 2017-10-27 DIAGNOSIS — R7989 Other specified abnormal findings of blood chemistry: Secondary | ICD-10-CM

## 2017-10-27 LAB — BASIC METABOLIC PANEL
BUN/Creatinine Ratio: 11 (ref 9–23)
BUN: 9 mg/dL (ref 6–24)
CO2: 26 mmol/L (ref 20–29)
Calcium: 9.4 mg/dL (ref 8.7–10.2)
Chloride: 102 mmol/L (ref 96–106)
Creatinine, Ser: 0.8 mg/dL (ref 0.57–1.00)
GFR calc Af Amer: 97 mL/min/{1.73_m2} (ref >59)
GFR calc non Af Amer: 84 mL/min/{1.73_m2} (ref >59)
Glucose: 89 mg/dL (ref 65–99)
Potassium: 3.3 mmol/L — ABNORMAL LOW (ref 3.5–5.2)
Sodium: 142 mmol/L (ref 134–144)

## 2017-10-27 NOTE — Progress Notes (Signed)
CARDIOLOGY OFFICE NOTE  Date:  10/27/2017    Miranda [name] Date of Birth: 10/01/1965 Medical Record #[id number]  PCP:  Gayland Curry, DO  Cardiologist:  Marisa Cyphers    Chief Complaint  Patient presents with  . Congestive Heart Failure    1 month check - seen for Dr. Marlou Porch    History of Present Illness: Miranda [name] is a 52 y.o. female who presents today for a 6 week check. Seen for Dr. Marlou Porch.   She has a history of iron deficiency anemia, mild nonobstructive CAD,chronic systolic HF, NICM, with previous EFat15%. In March 2015 EF was 50%. Her EKG is chronically abnormal with T wave inversion in several leads.   Seen foracute CHFback this past summer -echo with severeaortic regurgitation andEF back down to 30-35%. Cath with Ost RPDA to RPDA lesion 50% stenosed, severe LV dysfunction mod pulmonary hypertension, and mild 2+MR. EF was 15%. PA pk pressure was 60 mmHg. Severe aortic insuff. Mildly dilated aortic root. Mild coronary obstructive disease with a normal LAD, normal small intermediate vessel, normal left circumflex, and a large dominant RCA with smooth 50% ostial PDA narrowing.)Multiple teeth extractions. Then underwent AVR with Dr. Cyndia Bent on 03/12/2017 with23 mm Edwards Magna-Ease pericardial valve and MV annuloplasty with 28 mm Sorin 3D MEMO ring.She was treated with coumadin for 3 months and then switched over to aspirin.   I have seen her several times - she has had elevation of her LFTs - statin was stopped. Echo was updated - EF remained down. Plan was to try and maximize her medicines.   Last seen by Dr. Marlou Porch back in February - switched over to [facility] - limited echo obtained.   Comes in today. Here alone. Says she is "ok". Breathing is ok. Little dizzy at times - mostly with walking - knows she is tired when "my feet start dragging". BP is lower. She is compliant with her medicines. Weighs about every other day at home. Watching  her salt. Occasional swelling but overall improved. Seeing GI again in May - last LFTs look much better. She is worried about long term use of Delene Loll - was denied the copay card - it will cost her $513 dollars a month which she does not have.   Past Medical History:  Diagnosis Date  . Anemia   . Anxiety   . At risk for sudden cardiac death 11-09-12  . CHF - Combined Systolic + Diastolic Dysfunction. EF of 15-20% with Grade II diastolic dysfunction on echo 10/27/12 10/28/2012  . Coronary artery disease   . Depression   . Dyspnea   . Fibroids   . Heart murmur   . Hypertension   . Hypothyroidism   . S/P cardiac catheterization, 11-09-12, normal coronaries with minimal luminal irregularities in RCA system 10/30/2012  . Thyroid disease    hypothyroidism    Past Surgical History:  Procedure Laterality Date  . AORTIC VALVE REPLACEMENT N/A 03/12/2017   Procedure: AORTIC VALVE REPLACEMENT (AVR) with Mitral Annuloplasty;  Surgeon: Gaye Pollack, MD;  Location: [facility] OR;  Service: Open Heart Surgery;  Laterality: N/A;  . LEFT AND RIGHT HEART CATHETERIZATION WITH CORONARY ANGIOGRAM N/A 09-Nov-2012   Procedure: LEFT AND RIGHT HEART CATHETERIZATION WITH CORONARY ANGIOGRAM;  Surgeon: Leonie Man, MD;  Location: [facility] CATH LAB;  Service: Cardiovascular;  Laterality: N/A;  . MITRAL VALVE REPAIR N/A 03/12/2017   Procedure: MITRAL VALVE REPAIR (MVR);  Surgeon: Gaye Pollack, MD;  Location: [place];  Service: Open Heart Surgery;  Laterality: N/A;  mitral annuloplasty  . MULTIPLE EXTRACTIONS WITH ALVEOLOPLASTY N/A 01/21/2017   Procedure: Extraction of tooth #'s 4-13 with alveoloplasty;  Surgeon: Lenn Cal, DDS;  Location: [place];  Service: Oral Surgery;  Laterality: N/A;  . RIGHT/LEFT HEART CATH AND CORONARY ANGIOGRAPHY N/A 01/09/2017   Procedure: Right/Left Heart Cath and Coronary Angiography;  Surgeon: Troy Sine, MD;  Location: [place] CV LAB;  Service: Cardiovascular;  Laterality: N/A;  . TEE  WITHOUT CARDIOVERSION N/A 03/12/2017   Procedure: TRANSESOPHAGEAL ECHOCARDIOGRAM (TEE);  Surgeon: Gaye Pollack, MD;  Location: [place];  Service: Open Heart Surgery;  Laterality: N/A;     Medications: Current Meds  Medication Sig  . aspirin EC 81 MG EC tablet Take 1 tablet (81 mg total) by mouth daily.  Marland Kitchen buPROPion (WELLBUTRIN XL) 150 MG 24 hr tablet Take 1 tablet (150 mg total) by mouth daily.  . carvedilol (COREG) 6.25 MG tablet Take 1 tablet (6.25 mg total) by mouth 2 (two) times daily.  . ferrous sulfate 325 (65 FE) MG EC tablet Take 325 mg by mouth 2 (two) times daily.  . fluticasone (FLONASE) 50 MCG/ACT nasal spray Place 1 spray into both nostrils daily as needed for allergies or rhinitis.  . furosemide (LASIX) 40 MG tablet Take 1 tablet (40 mg total) by mouth daily. Monday and thurday take 60mg   . levothyroxine (SYNTHROID, LEVOTHROID) 112 MCG tablet TAKE 1 TABLET BY MOUTH ONCE DAILY BEFORE BREAKFAST  . mirtazapine (REMERON) 15 MG tablet Take 1 tablet (15 mg total) by mouth at bedtime.  . Multiple Vitamins-Minerals (ONE-A-DAY MENOPAUSE FORMULA) TABS Take 1 tablet by mouth daily.  . sacubitril-valsartan (ENTRESTO) 24-26 MG Take 1 tablet by mouth 2 (two) times daily.  Marland Kitchen spironolactone (ALDACTONE) 25 MG tablet Take 1 tablet (25 mg total) by mouth daily.  . traZODone (DESYREL) 150 MG tablet Take 1 tablet (150 mg total) by mouth at bedtime.     Allergies: Allergies  Allergen Reactions  . Codeine Hives    Social History: The patient  reports that she quit smoking about 39 years ago. Her smoking use included cigars and cigarettes. She has a 2.50 pack-year smoking history. She has never used smokeless tobacco. She reports that she drinks about 1.8 oz of alcohol per week. She reports that she has current or past drug history. Drug: Marijuana. Frequency: 5.00 times per week.   Family History: The patient's family history includes Arthritis in her paternal uncle; Bone cancer in her  maternal uncle and paternal grandfather; Breast cancer in her cousin; Colon polyps in her mother; Heart disease in her maternal grandmother and mother; Hypertension in her father, maternal grandmother, and mother; Liver disease in her mother; Stroke in her father.   Review of Systems: Please see the history of present illness.   Otherwise, the review of systems is positive for none.   All other systems are reviewed and negative.   Physical Exam: VS:  BP 124/86 (BP Location: Left Arm, Patient Position: Sitting, Cuff Size: Normal)   Pulse 80   Ht 5' 4.5" (1.638 m)   Wt 147 lb 12.8 oz (67 kg)   LMP 01/17/2015   SpO2 96%   BMI 24.98 kg/m  .  BMI Body mass index is 24.98 kg/m.  Wt Readings from Last 3 Encounters:  10/27/17 147 lb 12.8 oz (67 kg)  09/22/17 143 lb (64.9 kg)  09/10/17 143 lb (64.9 kg)    General:  Her  affect is a little flat. Alert and in no acute distress.   HEENT: Normal.  Neck: Supple, no JVD, carotid bruits, or masses noted.  Cardiac: Regular rate and rhythm. Soft outflow murmur. No edema.  Respiratory:  Lungs are clear to auscultation bilaterally with normal work of breathing.  GI: Soft and nontender.  MS: No deformity or atrophy. Gait and ROM intact.  Skin: Warm and dry. Color is normal.  Neuro:  Strength and sensation are intact and no gross focal deficits noted.  Psych: Alert, appropriate and with normal affect.   LABORATORY DATA:  EKG:  EKG is not ordered today.  Lab Results  Component Value Date   WBC 3.8 (L) 09/14/2017   HGB 13.3 09/14/2017   HCT 41.0 09/14/2017   PLT 166 09/14/2017   GLUCOSE 84 07/16/2017   CHOL 138 06/01/2017   TRIG 84 06/01/2017   HDL 49 06/01/2017   LDLCALC 72 06/01/2017   ALT 36 (H) 08/27/2017   AST 50 (H) 08/27/2017   NA 139 07/16/2017   K 4.0 07/16/2017   CL 101 07/16/2017   CREATININE 0.85 07/16/2017   BUN 18 07/16/2017   CO2 27 07/16/2017   TSH 0.88 09/08/2017   INR 0.95 09/14/2017   HGBA1C 6.1 (H) 03/10/2017      BNP (last 3 results) No results for input(s): BNP in the last 8760 hours.  ProBNP (last 3 results) Recent Labs    11/27/16 0935 06/01/17 0931 07/06/17 1225  PROBNP 5,337* 458* 240     Other Studies Reviewed Today:  Echo Study Conclusions 10/2017  - Left ventricle: The cavity size was mildly dilated. Systolic   function was normal. The estimated ejection fraction was in the   range of 50% to 55%. Features are consistent with a pseudonormal   left ventricular filling pattern, with concomitant abnormal   relaxation and increased filling pressure (grade 2 diastolic   dysfunction). - Aortic valve: A bioprosthesis was present. Valve area (VTI): 1.84   cm^2. Valve area (Vmax): 1.85 cm^2. Valve area (Vmean): 1.83   cm^2. - Mitral valve: Prior procedures included surgical repair. There   was mild regurgitation. Valve area by pressure half-time: 2.14   cm^2. Valve area by continuity equation (using LVOT flow): 1.81   cm^2. - Left atrium: The atrium was mildly dilated.  Notes recorded by Jerline Pain, MD on 10/21/2017 at 10:19 AM EDT Improved EF on Entresto. Continue. Bioprosthetic aortic valve and mitral valve repair is intact.  Candee Furbish, MD      Echo Study Conclusions November 2018  - Left ventricle: The cavity size was normal. Wall thickness was   normal. Systolic function was moderately to severely reduced. The   estimated ejection fraction was in the range of 30% to 35%.   Moderate diffuse hypokinesis with distinct regional wall motion   abnormalities. Hypokinesis of the inferior myocardium. The study   is not technically sufficient to allow evaluation of LV diastolic   function. Doppler parameters are consistent with high ventricular   filling pressure. - Ventricular septum: Septal motion showed paradox. - Aortic valve: A bioprosthesis was present and functioning   normally. Mean gradient (S): 11 mm Hg. Valve area (VTI): 2.08   cm^2. - Mitral valve: S/P MV  repair with annuloplasty ring. Moderate   diffuse thickening. There was trivial regurgitation directed   centrally. - Left atrium: The atrium was moderately to severely dilated. - Right atrium: The atrium was mildly dilated. - Pulmonic valve: There was  trivial regurgitation.  Impressions:  - Compared to prior study, there is now a normally functioning AV   bioprosthesis and MV repair with trivial central MR.   Assessment/Plan:  1. Chronic systolic HF - EF was 30 to 35% - now improved per recent echo - felt dizzy with higher doses of Coreg. On Entresto. She notes more dizziness. BP has improved as well. She does not wish to take the higher dose - more concerned with how she will afford the current plan of care. Will hold on increasing but address again on return visit.  I have talked with our staff here - given 30 day free card along with samples while staff works on long term coverage.  BMET today.    2. Prior AVR, MV repair - reminded of SBE - most recent echo stable.   3. Elevated LFTs - ? Possible immune disorder - this has improved - seeing GI again in May.   4. CAD non obstructive- manage medically. CV risk factor modification.No chest pain reported.   5. HLD - her statin has been stopped due to elevated LFTS.   6. Iron deficiency anemia - followed by PCP  Current medicines are reviewed with the patient today.  The patient does not have concerns regarding medicines other than what has been noted above.  The following changes have been made:  See above.  Labs/ tests ordered today include:    Orders Placed This Encounter  Procedures  . Basic metabolic panel     Disposition:   FU with me in about 2 months.    Patient is agreeable to this plan and will call if any problems develop in the interim.   SignedTruitt Merle, NP  10/27/2017 8:51 AM  [facility] [address] [place], [place]  [address] Phone: [phone] Fax: [phone]

## 2017-10-27 NOTE — Patient Instructions (Addendum)
We will be checking the following labs today - BMET   Medication Instructions:    Continue with your current medicines.   Samples of Entresto with free 30 day card.     Testing/Procedures To Be Arranged:  N/A  Follow-Up:   See me in 2 months    Other Special Instructions:   N/A    If you need a refill on your cardiac medications before your next appointment, please call your pharmacy.   Call the [place] office at [phone] if you have any questions, problems or concerns.

## 2017-10-28 ENCOUNTER — Other Ambulatory Visit: Payer: Self-pay | Admitting: *Deleted

## 2017-10-28 DIAGNOSIS — E876 Hypokalemia: Secondary | ICD-10-CM

## 2017-11-04 ENCOUNTER — Other Ambulatory Visit: Payer: PPO | Admitting: *Deleted

## 2017-11-04 DIAGNOSIS — E876 Hypokalemia: Secondary | ICD-10-CM | POA: Diagnosis not present

## 2017-11-04 LAB — BASIC METABOLIC PANEL
BUN/Creatinine Ratio: 21 (ref 9–23)
BUN: 22 mg/dL (ref 6–24)
CO2: 25 mmol/L (ref 20–29)
Calcium: 10.2 mg/dL (ref 8.7–10.2)
Chloride: 100 mmol/L (ref 96–106)
Creatinine, Ser: 1.04 mg/dL — ABNORMAL HIGH (ref 0.57–1.00)
GFR calc Af Amer: 71 mL/min/{1.73_m2} (ref >59)
GFR calc non Af Amer: 61 mL/min/{1.73_m2} (ref >59)
Glucose: 84 mg/dL (ref 65–99)
Potassium: 3.9 mmol/L (ref 3.5–5.2)
Sodium: 141 mmol/L (ref 134–144)

## 2017-11-06 ENCOUNTER — Ambulatory Visit (INDEPENDENT_AMBULATORY_CARE_PROVIDER_SITE_OTHER): Payer: PPO

## 2017-11-06 VITALS — BP 118/54 | HR 84 | Temp 97.9°F | Ht 65.0 in | Wt 145.0 lb

## 2017-11-06 DIAGNOSIS — Z0000 Encounter for general adult medical examination without abnormal findings: Secondary | ICD-10-CM | POA: Diagnosis not present

## 2017-11-06 MED ORDER — ZOSTER VAC RECOMB ADJUVANTED 50 MCG/0.5ML IM SUSR: 0.5000 mL | Freq: Once | INTRAMUSCULAR | 1 refills | Status: AC

## 2017-11-06 NOTE — Patient Instructions (Signed)
Miranda [name] , Thank you for taking time to come for your Medicare Wellness Visit. I appreciate your ongoing commitment to your health goals. Please review the following plan we discussed and let me know if I can assist you in the future.   Screening recommendations/referrals: Cologuard up to date, due 02/14/2019  Mammogram up to date, due 02/27/2018 Bone Density up to date, due 02/26/2018 Recommended yearly ophthalmology/optometry visit for glaucoma screening and checkup Recommended yearly dental visit for hygiene and checkup  Vaccinations: Influenza vaccine up to date, due 2019 fall season Pneumococcal vaccine up to date, completed Tdap vaccine up to date, due 01/24/2026 Shingles vaccine due, prescription sent to pharmacy    Advanced directives: Advance directive discussed with you today. I have provided a copy for you to complete at home and have notarized. Once this is complete please bring a copy in to our office so we can scan it into your chart.  Conditions/risks identified: none  Next appointment: Dr. Mariea [name] 01/11/2018 @ 8:30am            Miranda Dense, RN 11/07/2017 @ 9:15am  Preventive Care 40-64 Years, Female Preventive care refers to lifestyle choices and visits with your health care provider that can promote health and wellness. What does preventive care include?  A yearly physical exam. This is also called an annual well check.  Dental exams once or twice a year.  Routine eye exams. Ask your health care provider how often you should have your eyes checked.  Personal lifestyle choices, including:  Daily care of your teeth and gums.  Regular physical activity.  Eating a healthy diet.  Avoiding tobacco and drug use.  Limiting alcohol use.  Practicing safe sex.  Taking low-dose aspirin daily starting at age 93.  Taking vitamin and mineral supplements as recommended by your health care provider. What happens during an annual well check? The services and screenings  done by your health care provider during your annual well check will depend on your age, overall health, lifestyle risk factors, and family history of disease. Counseling  Your health care provider may ask you questions about your:  Alcohol use.  Tobacco use.  Drug use.  Emotional well-being.  Home and relationship well-being.  Sexual activity.  Eating habits.  Work and work Statistician.  Method of birth control.  Menstrual cycle.  Pregnancy history. Screening  You may have the following tests or measurements:  Height, weight, and BMI.  Blood pressure.  Lipid and cholesterol levels. These may be checked every 5 years, or more frequently if you are over 35 years old.  Skin check.  Lung cancer screening. You may have this screening every year starting at age 48 if you have a 30-pack-year history of smoking and currently smoke or have quit within the past 15 years.  Fecal occult blood test (FOBT) of the stool. You may have this test every year starting at age 19.  Flexible sigmoidoscopy or colonoscopy. You may have a sigmoidoscopy every 5 years or a colonoscopy every 10 years starting at age 54.  Hepatitis C blood test.  Hepatitis B blood test.  Sexually transmitted disease (STD) testing.  Diabetes screening. This is done by checking your blood sugar (glucose) after you have not eaten for a while (fasting). You may have this done every 1-3 years.  Mammogram. This may be done every 1-2 years. Talk to your health care provider about when you should start having regular mammograms. This may depend on whether you have a  family history of breast cancer.  BRCA-related cancer screening. This may be done if you have a family history of breast, ovarian, tubal, or peritoneal cancers.  Pelvic exam and Pap test. This may be done every 3 years starting at age 40. Starting at age 38, this may be done every 5 years if you have a Pap test in combination with an HPV test.  Bone  density scan. This is done to screen for osteoporosis. You may have this scan if you are at high risk for osteoporosis. Discuss your test results, treatment options, and if necessary, the need for more tests with your health care provider. Vaccines  Your health care provider may recommend certain vaccines, such as:  Influenza vaccine. This is recommended every year.  Tetanus, diphtheria, and acellular pertussis (Tdap, Td) vaccine. You may need a Td booster every 10 years.  Zoster vaccine. You may need this after age 35.  Pneumococcal 13-valent conjugate (PCV13) vaccine. You may need this if you have certain conditions and were not previously vaccinated.  Pneumococcal polysaccharide (PPSV23) vaccine. You may need one or two doses if you smoke cigarettes or if you have certain conditions. Talk to your health care provider about which screenings and vaccines you need and how often you need them. This information is not intended to replace advice given to you by your health care provider. Make sure you discuss any questions you have with your health care provider. Document Released: 08/17/2015 Document Revised: 04/09/2016 Document Reviewed: 05/22/2015 Elsevier Interactive Patient Education  2017 Miranda [name] Prevention in the Home Falls can cause injuries. They can happen to people of all ages. There are many things you can do to make your home safe and to help prevent falls. What can I do on the outside of my home?  Regularly fix the edges of walkways and driveways and fix any cracks.  Remove anything that might make you trip as you walk through a door, such as a raised step or threshold.  Trim any bushes or trees on the path to your home.  Use bright outdoor lighting.  Clear any walking paths of anything that might make someone trip, such as rocks or tools.  Regularly check to see if handrails are loose or broken. Make sure that both sides of any steps have  handrails.  Any raised decks and porches should have guardrails on the edges.  Have any leaves, snow, or ice cleared regularly.  Use sand or salt on walking paths during winter.  Clean up any spills in your garage right away. This includes oil or grease spills. What can I do in the bathroom?  Use night lights.  Install grab bars by the toilet and in the tub and shower. Do not use towel bars as grab bars.  Use non-skid mats or decals in the tub or shower.  If you need to sit down in the shower, use a plastic, non-slip stool.  Keep the floor dry. Clean up any water that spills on the floor as soon as it happens.  Remove soap buildup in the tub or shower regularly.  Attach bath mats securely with double-sided non-slip rug tape.  Do not have throw rugs and other things on the floor that can make you trip. What can I do in the bedroom?  Use night lights.  Make sure that you have a light by your bed that is easy to reach.  Do not use any sheets or blankets that are  too big for your bed. They should not hang down onto the floor.  Have a firm chair that has side arms. You can use this for support while you get dressed.  Do not have throw rugs and other things on the floor that can make you trip. What can I do in the kitchen?  Clean up any spills right away.  Avoid walking on wet floors.  Keep items that you use a lot in easy-to-reach places.  If you need to reach something above you, use a strong step stool that has a grab bar.  Keep electrical cords out of the way.  Do not use floor polish or wax that makes floors slippery. If you must use wax, use non-skid floor wax.  Do not have throw rugs and other things on the floor that can make you trip. What can I do with my stairs?  Do not leave any items on the stairs.  Make sure that there are handrails on both sides of the stairs and use them. Fix handrails that are broken or loose. Make sure that handrails are as long as  the stairways.  Check any carpeting to make sure that it is firmly attached to the stairs. Fix any carpet that is loose or worn.  Avoid having throw rugs at the top or bottom of the stairs. If you do have throw rugs, attach them to the floor with carpet tape.  Make sure that you have a light switch at the top of the stairs and the bottom of the stairs. If you do not have them, ask someone to add them for you. What else can I do to help prevent falls?  Wear shoes that:  Do not have high heels.  Have rubber bottoms.  Are comfortable and fit you well.  Are closed at the toe. Do not wear sandals.  If you use a stepladder:  Make sure that it is fully opened. Do not climb a closed stepladder.  Make sure that both sides of the stepladder are locked into place.  Ask someone to hold it for you, if possible.  Clearly mark and make sure that you can see:  Any grab bars or handrails.  First and last steps.  Where the edge of each step is.  Use tools that help you move around (mobility aids) if they are needed. These include:  Canes.  Walkers.  Scooters.  Crutches.  Turn on the lights when you go into a dark area. Replace any light bulbs as soon as they burn out.  Set up your furniture so you have a clear path. Avoid moving your furniture around.  If any of your floors are uneven, fix them.  If there are any pets around you, be aware of where they are.  Review your medicines with your doctor. Some medicines can make you feel dizzy. This can increase your chance of falling. Ask your doctor what other things that you can do to help prevent falls. This information is not intended to replace advice given to you by your health care provider. Make sure you discuss any questions you have with your health care provider. Document Released: 05/17/2009 Document Revised: 12/27/2015 Document Reviewed: 08/25/2014 Elsevier Interactive Patient Education  2017 [employer].

## 2017-11-06 NOTE — Progress Notes (Signed)
Subjective:   Miranda [name] is a 52 y.o. female who presents for Medicare Annual (Subsequent) preventive examination.  Last AWV-11/03/2016    Objective:     Vitals: BP (!) 118/54 (BP Location: Left Arm, Patient Position: Sitting)   Pulse 84   Temp 97.9 F (36.6 C) (Oral)   Ht '5\' 5"'$  (1.651 m)   Wt 145 lb (65.8 kg)   LMP 01/17/2015   SpO2 98%   BMI 24.13 kg/m   Body mass index is 24.13 kg/m.  Advanced Directives 11/06/2017 09/14/2017 03/26/2017 03/12/2017 03/10/2017 01/30/2017 01/14/2017  Does Patient Have a Medical Advance Directive? No No No No No No No  Does patient want to make changes to medical advance directive? - - - - - No - Patient declined -  Would patient like information on creating a medical advance directive? Yes (MAU/Ambulatory/Procedural Areas - Information given) No - Patient declined - No - Patient declined No - Patient declined - -  Pre-existing out of facility DNR order (yellow form or pink MOST form) - - - - - - -    Tobacco Social History   Tobacco Use  Smoking Status Former Smoker  . Packs/day: 0.50  . Years: 5.00  . Pack years: 2.50  . Types: Cigars, Cigarettes  . Last attempt to quit: 08/04/1978  . Years since quitting: 39.2  Smokeless Tobacco Never Used  Tobacco Comment   1 cigar daily-quit 10/24/12       Counseling given: Not Answered Comment: 1 cigar daily-quit 10/24/12     Clinical Intake:  Pre-visit preparation completed: No  Pain : No/denies pain     Nutritional Risks: None Diabetes: No  How often do you need to have someone help you when you read instructions, pamphlets, or other written materials from your doctor or pharmacy?: 1 - Never What is the last grade level you completed in school?: Some College  Interpreter Needed?: No  Information entered by :: [name], RN  Past Medical History:  Diagnosis Date  . Anemia   . Anxiety   . At risk for sudden cardiac death November 11, 2012  . CHF - Combined Systolic + Diastolic  Dysfunction. EF of 15-20% with Grade II diastolic dysfunction on echo 10/27/12 10/28/2012  . Coronary artery disease   . Depression   . Dyspnea   . Fibroids   . Heart murmur   . Hypertension   . Hypothyroidism   . S/P cardiac catheterization, 11-11-2012, normal coronaries with minimal luminal irregularities in RCA system 10/30/2012  . Thyroid disease    hypothyroidism   Past Surgical History:  Procedure Laterality Date  . AORTIC VALVE REPLACEMENT N/A 03/12/2017   Procedure: AORTIC VALVE REPLACEMENT (AVR) with Mitral Annuloplasty;  Surgeon: Gaye Pollack, MD;  [facility];  Service: Open Heart Surgery;  Laterality: N/A;  . LEFT AND RIGHT HEART CATHETERIZATION WITH CORONARY ANGIOGRAM N/A November 11, 2012   Procedure: LEFT AND RIGHT HEART CATHETERIZATION WITH CORONARY ANGIOGRAM;  Surgeon: Leonie Man, MD;  Location: [facility] CATH LAB;  Service: Cardiovascular;  Laterality: N/A;  . MITRAL VALVE REPAIR N/A 03/12/2017   Procedure: MITRAL VALVE REPAIR (MVR);  Surgeon: Gaye Pollack, MD;  Location: [place];  Service: Open Heart Surgery;  Laterality: N/A;  mitral annuloplasty  . MULTIPLE EXTRACTIONS WITH ALVEOLOPLASTY N/A 01/21/2017   Procedure: Extraction of tooth #'s 4-13 with alveoloplasty;  Surgeon: Lenn Cal, DDS;  Location: [place];  Service: Oral Surgery;  Laterality: N/A;  . RIGHT/LEFT HEART CATH AND CORONARY  ANGIOGRAPHY N/A 01/09/2017   Procedure: Right/Left Heart Cath and Coronary Angiography;  Surgeon: Troy Sine, MD;  Location: [place] CV LAB;  Service: Cardiovascular;  Laterality: N/A;  . TEE WITHOUT CARDIOVERSION N/A 03/12/2017   Procedure: TRANSESOPHAGEAL ECHOCARDIOGRAM (TEE);  Surgeon: Gaye Pollack, MD;  Location: [place];  Service: Open Heart Surgery;  Laterality: N/A;   Family History  Problem Relation Age of Onset  . Bone cancer Paternal Grandfather   . Hypertension Mother   . Liver disease Mother        autoimmune  . Heart disease Mother   . Colon polyps Mother   . Stroke  Father   . Hypertension Father   . Hypertension Maternal Grandmother   . Heart disease Maternal Grandmother   . Breast cancer Cousin   . Bone cancer Maternal Uncle   . Arthritis Paternal Uncle    Social History   Socioeconomic History  . Marital status: Single    Spouse name: Not on file  . Number of children: 1  . Years of education: 12th  . Highest education level: Not on file  Occupational History  . Occupation: unemployed    Employer: [employer]  . Financial resource strain: Not hard at all  . Food insecurity:    Worry: Never true    Inability: Never true  . Transportation needs:    Medical: No    Non-medical: No  Tobacco Use  . Smoking status: Former Smoker    Packs/day: 0.50    Years: 5.00    Pack years: 2.50    Types: Cigars, Cigarettes    Last attempt to quit: 08/04/1978    Years since quitting: 39.2  . Smokeless tobacco: Never Used  . Tobacco comment: 1 cigar daily-quit 10/24/12    Substance and Sexual Activity  . Alcohol use: Yes    Alcohol/week: 1.8 oz    Types: 3 Glasses of wine per week    Comment: per month  . Drug use: Yes    Frequency: 5.0 times per week    Types: Marijuana    Comment: few weeks ago last  . Sexual activity: Not Currently  Lifestyle  . Physical activity:    Days per week: 5 days    Minutes per session: 20 min  . Stress: To some extent  Relationships  . Social connections:    Talks on phone: More than three times a week    Gets together: More than three times a week    Attends religious service: Never    Active member of club or organization: No    Attends meetings of clubs or organizations: Never    Relationship status: Never married  Other Topics Concern  . Not on file  Social History Narrative   Patient lives at home alone. (Condo)   Caffeine use: 1/2 soda daily   Diet   Martial status:single   Is it one or more stories? Yes   How many persons live in your home? 1   Do you have any pets in your  home? No   Current or past profession: Biochemist, clinical   Do you exercise? No   Do you have a living will? No    Do you have a DNR form? No    Do you have signed POA/HPOA forms? No     Outpatient Encounter Medications as of 11/06/2017  Medication Sig  . aspirin EC 81 MG EC tablet Take 1 tablet (81 mg total)  by mouth daily.  Marland Kitchen buPROPion (WELLBUTRIN XL) 150 MG 24 hr tablet Take 1 tablet (150 mg total) by mouth daily.  . carvedilol (COREG) 6.25 MG tablet Take 1 tablet (6.25 mg total) by mouth 2 (two) times daily.  . ferrous sulfate 325 (65 FE) MG EC tablet Take 325 mg by mouth 2 (two) times daily.  . fluticasone (FLONASE) 50 MCG/ACT nasal spray Place 1 spray into both nostrils daily as needed for allergies or rhinitis.  . furosemide (LASIX) 40 MG tablet Take 1 tablet (40 mg total) by mouth daily. Monday and thurday take '60mg'$   . levothyroxine (SYNTHROID, LEVOTHROID) 112 MCG tablet TAKE 1 TABLET BY MOUTH ONCE DAILY BEFORE BREAKFAST  . mirtazapine (REMERON) 15 MG tablet Take 1 tablet (15 mg total) by mouth at bedtime.  . Multiple Vitamins-Minerals (ONE-A-DAY MENOPAUSE FORMULA) TABS Take 1 tablet by mouth daily.  . sacubitril-valsartan (ENTRESTO) 24-26 MG Take 1 tablet by mouth 2 (two) times daily.  Marland Kitchen spironolactone (ALDACTONE) 25 MG tablet Take 1 tablet (25 mg total) by mouth daily.  . traZODone (DESYREL) 150 MG tablet Take 1 tablet (150 mg total) by mouth at bedtime.  Marland Kitchen Zoster Vaccine Adjuvanted [facility]) injection Inject 0.5 mLs into the muscle once for 1 dose.  . [DISCONTINUED] Zoster Vaccine Adjuvanted [facility]) injection Inject 0.5 mLs into the muscle once.   No facility-administered encounter medications on file as of 11/06/2017.     Activities of Daily Living In your present state of health, do you have any difficulty performing the following activities: 11/06/2017 09/14/2017  Hearing? N N  Vision? N N  Difficulty concentrating or making decisions? Y N  Walking or climbing stairs? N N    Comment - -  Dressing or bathing? N N  Doing errands, shopping? N -  Preparing Food and eating ? N -  Using the Toilet? N -  In the past six months, have you accidently leaked urine? N -  Do you have problems with loss of bowel control? N -  Managing your Medications? N -  Managing your Finances? N -  Housekeeping or managing your Housekeeping? N -  Some recent data might be hidden    Patient Care Team: Gayland Curry, DO as PCP - General (Geriatric Medicine) Jerline Pain, MD as PCP - Cardiology (Cardiology) Emily Filbert, MD as Consulting Physician (Obstetrics and Gynecology) Haroldine Laws, Shaune Pascal, MD as Consulting Physician (Cardiology) Leta Baptist, Earlean Polka, MD as Consulting Physician (Neurology)    Assessment:   This is a routine wellness examination for Miranda [name].  Exercise Activities and Dietary recommendations Current Exercise Habits: Home exercise routine, Type of exercise: walking;Other - see comments(punching bag, pedalling), Time (Minutes): 20, Frequency (Times/Week): 5, Weekly Exercise (Minutes/Week): 100, Intensity: Mild  Goals    None      Fall Risk Fall Risk  11/06/2017 09/10/2017 03/26/2017 11/07/2016 11/03/2016  Falls in the past year? No No Yes Yes No  Number falls in past yr: - - 1 - -  Injury with Fall? - - No - -   Is the patient's home free of loose throw rugs in walkways, pet beds, electrical cords, etc?   yes      Grab bars in the bathroom? no      Handrails on the stairs?   yes      Adequate lighting?   yes  Depression Screen PHQ 2/9 Scores 11/06/2017 09/10/2017 11/07/2016 11/03/2016  PHQ - 2 Score 0 0 0 0  Cognitive Function: within normal function MMSE - Mini Mental State Exam 11/03/2016  Not completed: Unable to complete        Immunization History  Administered Date(s) Administered  . Hep A / Hep B 09/08/2017, 10/09/2017  . Influenza,inj,Quad PF,6+ Mos 05/26/2016, 05/07/2017  . Pneumococcal Conjugate-13 12/14/2014  . Pneumococcal  Polysaccharide-23 01/30/2017  . Tdap 01/25/2016    Qualifies for Shingles Vaccine? Yes, educated and prescription sent to pharmacy  Screening Tests Health Maintenance  Topic Date Due  . MAMMOGRAM  02/26/2018  . INFLUENZA VACCINE  03/04/2018  . PAP SMEAR  01/25/2019  . Fecal DNA (Cologuard)  02/14/2019  . TETANUS/TDAP  01/24/2026  . Hepatitis C Screening  Completed  . HIV Screening  Completed    Cancer Screenings: Lung: Low Dose CT Chest recommended if Age 98-80 years, 30 pack-year currently smoking OR have quit w/in 15years. Patient does not qualify. Breast:  Up to date on Mammogram? Yes   Up to date of Bone Density/Dexa? Yes Colorectal: up to date  Additional Screenings:  Hepatitis C Screening: declined     Plan:    I have personally reviewed and addressed the Medicare Annual Wellness questionnaire and have noted the following in the patient's chart:  A. Medical and social history B. Use of alcohol, tobacco or illicit drugs  C. Current medications and supplements D. Functional ability and status E.  Nutritional status F.  Physical activity G. Advance directives H. List of other physicians I.  Hospitalizations, surgeries, and ER visits in previous 12 months J.  [place] to include hearing, vision, cognitive, depression L. Referrals and appointments - none  In addition, I have reviewed and discussed with patient certain preventive protocols, quality metrics, and best practice recommendations. A written personalized care plan for preventive services as well as general preventive health recommendations were provided to patient.  See attached scanned questionnaire for additional information.   Signed,   [name], RN Nurse Health Advisor  Patient Concerns: Trouble sleeping 2-3 nights out of the week

## 2017-11-24 ENCOUNTER — Other Ambulatory Visit: Payer: Self-pay | Admitting: Nurse Practitioner

## 2017-11-26 ENCOUNTER — Telehealth: Payer: Self-pay | Admitting: Cardiology

## 2017-11-26 NOTE — Telephone Encounter (Signed)
New message:      Pt c/o medication issue:  1. Name of Medication: sacubitril-valsartan (ENTRESTO) 24-26 MG  2. How are you currently taking this medication (dosage and times per day)?  Take 1 tablet by mouth 2 (two) times daily. 3. Are you having a reaction (difficulty breathing--STAT)? No  4. What is your medication issue? Pt states she was given a discount card for medication but is not allowed to use this card more than once. Pt states she needs another card or financial assistance.

## 2017-11-27 MED ORDER — LISINOPRIL 20 MG PO TABS: 20.0000 mg | ORAL_TABLET | Freq: Every day | ORAL | 3 refills | Status: DC

## 2017-11-27 NOTE — Telephone Encounter (Signed)
Pt aware OK to change back in Lisinopril 20 mg a day.  She is going to finish her Entresto and then restart Lisinopril as instructed.  She is aware she can not take to 2 together.  She reports she does not need a new RX at this time.  Medication list will be updated and pt will c/b if further questions or concerns.

## 2017-11-27 NOTE — Telephone Encounter (Signed)
Switch back to lisinopril 20 QD Stop Entresto Thanks Candee Furbish, MD

## 2017-11-27 NOTE — Telephone Encounter (Signed)
I called the pts pharmacy [facility]) and was advised that the pts Delene Loll will cost her $45 for a 30 day supply which is about the lowest price we will be able to get it since a PA was done and approved on 09/23/17. Since the pt has insurance that is paying on her Delene Loll she is not eligible for pt assistance until she goes into the doughnut hole.  I called the pt and made her aware. The pt states that she has told Dr Marlou Porch that she cannot afford Delene Loll and is aware that I am forwarding this message to him and his nurse for advisement.

## 2017-12-14 ENCOUNTER — Encounter: Payer: Self-pay | Admitting: Nurse Practitioner

## 2017-12-30 DIAGNOSIS — K754 Autoimmune hepatitis: Secondary | ICD-10-CM | POA: Diagnosis not present

## 2018-01-04 ENCOUNTER — Ambulatory Visit: Payer: PPO | Admitting: Nurse Practitioner

## 2018-01-04 ENCOUNTER — Encounter: Payer: Self-pay | Admitting: Nurse Practitioner

## 2018-01-04 VITALS — BP 110/78 | HR 69 | Ht 64.5 in | Wt 145.8 lb

## 2018-01-04 DIAGNOSIS — Z9889 Other specified postprocedural states: Secondary | ICD-10-CM | POA: Diagnosis not present

## 2018-01-04 DIAGNOSIS — E782 Mixed hyperlipidemia: Secondary | ICD-10-CM

## 2018-01-04 DIAGNOSIS — I10 Essential (primary) hypertension: Secondary | ICD-10-CM | POA: Diagnosis not present

## 2018-01-04 DIAGNOSIS — I5042 Chronic combined systolic (congestive) and diastolic (congestive) heart failure: Secondary | ICD-10-CM

## 2018-01-04 DIAGNOSIS — R7989 Other specified abnormal findings of blood chemistry: Secondary | ICD-10-CM

## 2018-01-04 DIAGNOSIS — Z952 Presence of prosthetic heart valve: Secondary | ICD-10-CM | POA: Diagnosis not present

## 2018-01-04 DIAGNOSIS — R945 Abnormal results of liver function studies: Secondary | ICD-10-CM | POA: Diagnosis not present

## 2018-01-04 DIAGNOSIS — I428 Other cardiomyopathies: Secondary | ICD-10-CM | POA: Diagnosis not present

## 2018-01-04 NOTE — Progress Notes (Signed)
CARDIOLOGY OFFICE NOTE  Date:  01/04/2018    Miranda [name] Date of Birth: 04/14/1966 Medical Record #[id number]  PCP:  Gayland Curry, DO  Cardiologist:  Marisa Cyphers    Chief Complaint  Patient presents with  . Coronary Artery Disease  . Cardiac Valve Problem    Follow up visit - seen for Dr. Marlou Porch    History of Present Illness: Miranda [name] is a 52 y.o. female who presents today for a follow up visit. Seen for Dr. Marlou Porch.   She has a history of iron deficiency anemia, mild nonobstructive CAD,chronic systolic HF, NICM, with previous EFat15%. Her EKG has been chronically abnormal with T wave inversion in several leads.   Seen foracute CHFback in the summer of 2018 -echo with severeaortic regurgitation andEF back down to 30-35%. Cath with Ost RPDA to RPDA lesion 50% stenosed, severe LV dysfunction mod pulmonary hypertension, and mild 2+MR. EF was 15%. PA pk pressure was 60 mmHg. Severe aortic insuff. Mildly dilated aortic root. Mild coronary obstructive disease with a normal LAD, normal small intermediate vessel, normal left circumflex, and a large dominant RCA with smooth 50% ostial PDA narrowing.)Multiple teeth extractions. Then underwent AVR with Dr. Cyndia Bent on 03/12/2017 with23 mm Edwards Magna-Ease pericardial valve and MV annuloplasty with 28 mm Sorin 3D MEMO ring.She was treated with coumadin for 3 months and then switched over to aspirin.   I have seen her several times since along with Dr. Marlou Porch - she has had elevation of her LFTs - statin was stopped. Echo was updated - EF remained down initially. Plan was to try and maximize her medicines.   Last seen by Dr. Marlou Porch back in February - switched over to [facility] - limited echo obtained. EF had improved and was back up to 50 to 55%.  I then saw her back in March - got denied on the [facility] and had to switch back to ACE due to excessive cost.   Comes in today. Here alone. She has done  really well over the past several months. She is walking 30 minutes a day about 5 times a week. She is doing better with salt restriction. No chest pain. Her breathing is good. No swelling. Weight is stable. Tolerating her medicines without issue. Not too much dizziness noted. She has been fishing - caught a 7# bass. She is pretty happy with how she is doing. She has seen GI - sounds like no real etiology for her prior elevation of LFTs. She remains off statin. Having labs later this week with her PCP.   Past Medical History:  Diagnosis Date  . Anemia   . Anxiety   . At risk for sudden cardiac death 2012-11-09  . CHF - Combined Systolic + Diastolic Dysfunction. EF of 15-20% with Grade II diastolic dysfunction on echo 10/27/12 10/28/2012  . Coronary artery disease   . Depression   . Dyspnea   . Fibroids   . Heart murmur   . Hypertension   . Hypothyroidism   . S/P cardiac catheterization, 11/09/12, normal coronaries with minimal luminal irregularities in RCA system 10/30/2012  . Thyroid disease    hypothyroidism    Past Surgical History:  Procedure Laterality Date  . AORTIC VALVE REPLACEMENT N/A 03/12/2017   Procedure: AORTIC VALVE REPLACEMENT (AVR) with Mitral Annuloplasty;  Surgeon: Gaye Pollack, MD;  Location: [facility];  Service: Open Heart Surgery;  Laterality: N/A;  . LEFT AND RIGHT HEART CATHETERIZATION WITH CORONARY ANGIOGRAM N/A 09-Nov-2012  Procedure: LEFT AND RIGHT HEART CATHETERIZATION WITH CORONARY ANGIOGRAM;  Surgeon: Leonie Man, MD;  Location: [facility] CATH LAB;  Service: Cardiovascular;  Laterality: N/A;  . MITRAL VALVE REPAIR N/A 03/12/2017   Procedure: MITRAL VALVE REPAIR (MVR);  Surgeon: Gaye Pollack, MD;  Location: [place];  Service: Open Heart Surgery;  Laterality: N/A;  mitral annuloplasty  . MULTIPLE EXTRACTIONS WITH ALVEOLOPLASTY N/A 01/21/2017   Procedure: Extraction of tooth #'s 4-13 with alveoloplasty;  Surgeon: Lenn Cal, DDS;  Location: [place];  Service: Oral  Surgery;  Laterality: N/A;  . RIGHT/LEFT HEART CATH AND CORONARY ANGIOGRAPHY N/A 01/09/2017   Procedure: Right/Left Heart Cath and Coronary Angiography;  Surgeon: Troy Sine, MD;  Location: [place] CV LAB;  Service: Cardiovascular;  Laterality: N/A;  . TEE WITHOUT CARDIOVERSION N/A 03/12/2017   Procedure: TRANSESOPHAGEAL ECHOCARDIOGRAM (TEE);  Surgeon: Gaye Pollack, MD;  Location: [place];  Service: Open Heart Surgery;  Laterality: N/A;     Medications: No outpatient medications have been marked as taking for the 01/04/18 encounter (Office Visit) with Burtis Junes, NP.     Allergies: Allergies  Allergen Reactions  . Codeine Hives    Social History: The patient  reports that she quit smoking about 39 years ago. Her smoking use included cigars and cigarettes. She has a 2.50 pack-year smoking history. She has never used smokeless tobacco. She reports that she drinks about 1.8 oz of alcohol per week. She reports that she has current or past drug history. Drug: Marijuana. Frequency: 5.00 times per week.   Family History: The patient's family history includes Arthritis in her paternal uncle; Bone cancer in her maternal uncle and paternal grandfather; Breast cancer in her cousin; Colon polyps in her mother; Heart disease in her maternal grandmother and mother; Hypertension in her father, maternal grandmother, and mother; Liver disease in her mother; Stroke in her father.   Review of Systems: Please see the history of present illness.   Otherwise, the review of systems is positive for none.   All other systems are reviewed and negative.   Physical Exam: VS:  BP 110/78 (BP Location: Left Arm, Patient Position: Sitting, Cuff Size: Normal)   Pulse 69   Ht 5' 4.5" (1.638 m)   Wt 145 lb 12.8 oz (66.1 kg)   LMP 01/17/2015   SpO2 99% Comment: at rest  BMI 24.64 kg/m  .  BMI Body mass index is 24.64 kg/m.  Wt Readings from Last 3 Encounters:  01/04/18 145 lb 12.8 oz (66.1 kg)    11/06/17 145 lb (65.8 kg)  10/27/17 147 lb 12.8 oz (67 kg)    General: Pleasant. Well developed, well nourished and in no acute distress. Her weight is stable.   HEENT: Normal.  Neck: Supple, no JVD, carotid bruits, or masses noted.  Cardiac: Regular rate and rhythm. No murmurs, rubs, or gallops. No edema.  Respiratory:  Lungs are clear to auscultation bilaterally with normal work of breathing.  GI: Soft and nontender.  MS: No deformity or atrophy. Gait and ROM intact.  Skin: Warm and dry. Color is normal.  Neuro:  Strength and sensation are intact and no gross focal deficits noted.  Psych: Alert, appropriate and with normal affect.   LABORATORY DATA:  EKG:  EKG is not ordered today.  Lab Results  Component Value Date   WBC 3.8 (L) 09/14/2017   HGB 13.3 09/14/2017   HCT 41.0 09/14/2017   PLT 166 09/14/2017   GLUCOSE 84  11/04/2017   CHOL 138 06/01/2017   TRIG 84 06/01/2017   HDL 49 06/01/2017   LDLCALC 72 06/01/2017   ALT 36 (H) 08/27/2017   AST 50 (H) 08/27/2017   NA 141 11/04/2017   K 3.9 11/04/2017   CL 100 11/04/2017   CREATININE 1.04 (H) 11/04/2017   BUN 22 11/04/2017   CO2 25 11/04/2017   TSH 0.88 09/08/2017   INR 0.95 09/14/2017   HGBA1C 6.1 (H) 03/10/2017     BNP (last 3 results) No results for input(s): BNP in the last 8760 hours.  ProBNP (last 3 results) Recent Labs    06/01/17 0931 07/06/17 1225  PROBNP 458* 240     Other Studies Reviewed Today:  Echo Study Conclusions 10/2017  - Left ventricle: The cavity size was mildly dilated. Systolic function was normal. The estimated ejection fraction was in the range of 50% to 55%. Features are consistent with a pseudonormal left ventricular filling pattern, with concomitant abnormal relaxation and increased filling pressure (grade 2 diastolic dysfunction). - Aortic valve: A bioprosthesis was present. Valve area (VTI): 1.84 cm^2. Valve area (Vmax): 1.85 cm^2. Valve area (Vmean):  1.83 cm^2. - Mitral valve: Prior procedures included surgical repair. There was mild regurgitation. Valve area by pressure half-time: 2.14 cm^2. Valve area by continuity equation (using LVOT flow): 1.81 cm^2. - Left atrium: The atrium was mildly dilated.  Notes recorded by Jerline Pain, MD on 10/21/2017 at 10:19 AM EDT Improved EF on Entresto. Continue. Bioprosthetic aortic valve and mitral valve repair is intact.  Candee Furbish, MD      Echo Study Conclusions November 2018  - Left ventricle: The cavity size was normal. Wall thickness was normal. Systolic function was moderately to severely reduced. The estimated ejection fraction was in the range of 30% to 35%. Moderate diffuse hypokinesis with distinct regional wall motion abnormalities. Hypokinesis of the inferior myocardium. The study is not technically sufficient to allow evaluation of LV diastolic function. Doppler parameters are consistent with high ventricular filling pressure. - Ventricular septum: Septal motion showed paradox. - Aortic valve: A bioprosthesis was present and functioning normally. Mean gradient (S): 11 mm Hg. Valve area (VTI): 2.08 cm^2. - Mitral valve: S/P MV repair with annuloplasty ring. Moderate diffuse thickening. There was trivial regurgitation directed centrally. - Left atrium: The atrium was moderately to severely dilated. - Right atrium: The atrium was mildly dilated. - Pulmonic valve: There was trivial regurgitation.  Impressions:  - Compared to prior study, there is now a normally functioning AV bioprosthesis and MV repair with trivial central MR.   Assessment/Plan:  1. Chronic systolic HF - EF was 30 to 35% - now improved per recent echo with EF up to 50 to 55% - she has not tolerated higher doses of Coreg due to dizziness but currently doing quite well. She was not able to afford Entresto and now back on ACE. Looks to be NYHA I/II at this time.  No changes made today.   2. Prior AVR, MV repair - reminded of SBE - most recent echo stable. Will follow.   3. Elevated LFTs - unclear etiology. Seeing PCP later this week for labs.   4. CAD non obstructive- manage medically. CV risk factor modification.No chest pain reported.She is doing well with exercise and better with her diet.   5. HLD - her statin has been stopped due to elevated LFTS. Would like to get her lipids rechecked - may need to consider other options going forward.  6. Iron deficiency anemia - followed by PCP   Current medicines are reviewed with the patient today.  The patient does not have concerns regarding medicines other than what has been noted above.  The following changes have been made:  See above.  Labs/ tests ordered today include:   No orders of the defined types were placed in this encounter.    Disposition:   FU with Dr. Marlou Porch in 4 months.   Patient is agreeable to this plan and will call if any problems develop in the interim.   SignedTruitt Merle, NP  01/04/2018 8:54 AM  [facility] [address] [place], [place]  [address] Phone: [phone] Fax: [phone]

## 2018-01-04 NOTE — Patient Instructions (Signed)
We will be checking the following labs today - NONE  Will ask your PCP to draw your lipids for [place].    Medication Instructions:    Continue with your current medicines.     Testing/Procedures To Be Arranged:  N/A  Follow-Up:   See Dr. Marlou Porch in about 4 months.     Other Special Instructions:   Keep up the good work!!    If you need a refill on your cardiac medications before your next appointment, please call your pharmacy.   Call the [place] office at [phone] if you have any questions, problems or concerns.

## 2018-01-07 ENCOUNTER — Other Ambulatory Visit: Payer: Self-pay | Admitting: Internal Medicine

## 2018-01-07 ENCOUNTER — Other Ambulatory Visit: Payer: Self-pay | Admitting: *Deleted

## 2018-01-07 ENCOUNTER — Other Ambulatory Visit: Payer: PPO

## 2018-01-07 DIAGNOSIS — R748 Abnormal levels of other serum enzymes: Secondary | ICD-10-CM | POA: Diagnosis not present

## 2018-01-07 DIAGNOSIS — E039 Hypothyroidism, unspecified: Secondary | ICD-10-CM | POA: Diagnosis not present

## 2018-01-07 DIAGNOSIS — K754 Autoimmune hepatitis: Secondary | ICD-10-CM | POA: Diagnosis not present

## 2018-01-07 DIAGNOSIS — I5042 Chronic combined systolic (congestive) and diastolic (congestive) heart failure: Secondary | ICD-10-CM

## 2018-01-08 LAB — COMPLETE METABOLIC PANEL WITH GFR
AG Ratio: 1 (calc) (ref 1.0–2.5)
ALT: 13 U/L (ref 6–29)
AST: 25 U/L (ref 10–35)
Albumin: 4.3 g/dL (ref 3.6–5.1)
Alkaline phosphatase (APISO): 61 U/L (ref 33–130)
BUN/Creatinine Ratio: 21 (calc) (ref 6–22)
BUN: 32 mg/dL — ABNORMAL HIGH (ref 7–25)
CO2: 29 mmol/L (ref 20–32)
Calcium: 9.7 mg/dL (ref 8.6–10.4)
Chloride: 103 mmol/L (ref 98–110)
Creat: 1.51 mg/dL — ABNORMAL HIGH (ref 0.50–1.05)
GFR, Est African American: 45 mL/min/{1.73_m2} — ABNORMAL LOW (ref >=60)
GFR, Est Non African American: 39 mL/min/{1.73_m2} — ABNORMAL LOW (ref >=60)
Globulin: 4.4 g/dL (calc) — ABNORMAL HIGH (ref 1.9–3.7)
Glucose, Bld: 81 mg/dL (ref 65–99)
Potassium: 5 mmol/L (ref 3.5–5.3)
Sodium: 137 mmol/L (ref 135–146)
Total Bilirubin: 0.3 mg/dL (ref 0.2–1.2)
Total Protein: 8.7 g/dL — ABNORMAL HIGH (ref 6.1–8.1)

## 2018-01-08 LAB — CBC WITH DIFFERENTIAL/PLATELET
Basophils Absolute: 31 cells/uL (ref 0–200)
Basophils Relative: 0.5 %
Eosinophils Absolute: 192 cells/uL (ref 15–500)
Eosinophils Relative: 3.1 %
HCT: 34.8 % — ABNORMAL LOW (ref 35.0–45.0)
Hemoglobin: 11.4 g/dL — ABNORMAL LOW (ref 11.7–15.5)
Lymphs Abs: 1872 cells/uL (ref 850–3900)
MCH: 27.3 pg (ref 27.0–33.0)
MCHC: 32.8 g/dL (ref 32.0–36.0)
MCV: 83.3 fL (ref 80.0–100.0)
MPV: 11.2 fL (ref 7.5–12.5)
Monocytes Relative: 8.8 %
Neutro Abs: 3559 cells/uL (ref 1500–7800)
Neutrophils Relative %: 57.4 %
Platelets: 165 10*3/uL (ref 140–400)
RBC: 4.18 10*6/uL (ref 3.80–5.10)
RDW: 13 % (ref 11.0–15.0)
Total Lymphocyte: 30.2 %
WBC mixed population: 546 cells/uL (ref 200–950)
WBC: 6.2 10*3/uL (ref 3.8–10.8)

## 2018-01-08 LAB — TSH: TSH: 0.38 mIU/L — ABNORMAL LOW

## 2018-01-08 LAB — HEPATIC FUNCTION PANEL
AG Ratio: 1 (calc) (ref 1.0–2.5)
ALT: 13 U/L (ref 6–29)
AST: 25 U/L (ref 10–35)
Albumin: 4.3 g/dL (ref 3.6–5.1)
Alkaline phosphatase (APISO): 61 U/L (ref 33–130)
Bilirubin, Direct: 0.1 mg/dL (ref 0.0–0.2)
Globulin: 4.4 g/dL (calc) — ABNORMAL HIGH (ref 1.9–3.7)
Indirect Bilirubin: 0.2 mg/dL (calc) (ref 0.2–1.2)
Total Bilirubin: 0.3 mg/dL (ref 0.2–1.2)
Total Protein: 8.7 g/dL — ABNORMAL HIGH (ref 6.1–8.1)

## 2018-01-08 LAB — IGG, IGA, IGM
IgG (Immunoglobin G), Serum: 2944 mg/dL — ABNORMAL HIGH (ref 694–1618)
IgM, Serum: 112 mg/dL (ref 48–271)
Immunoglobulin A: 312 mg/dL (ref 81–463)

## 2018-01-08 LAB — LIPID PANEL
Cholesterol: 196 mg/dL (ref <200)
HDL: 50 mg/dL — ABNORMAL LOW (ref >50)
LDL Cholesterol (Calc): 127 mg/dL (calc) — ABNORMAL HIGH
Non-HDL Cholesterol (Calc): 146 mg/dL (calc) — ABNORMAL HIGH (ref <130)
Total CHOL/HDL Ratio: 3.9 (calc) (ref <5.0)
Triglycerides: 89 mg/dL (ref <150)

## 2018-01-11 ENCOUNTER — Ambulatory Visit (INDEPENDENT_AMBULATORY_CARE_PROVIDER_SITE_OTHER): Payer: PPO | Admitting: Internal Medicine

## 2018-01-11 ENCOUNTER — Encounter: Payer: Self-pay | Admitting: Internal Medicine

## 2018-01-11 VITALS — BP 112/78 | HR 69 | Temp 97.5°F | Ht 65.0 in | Wt 146.0 lb

## 2018-01-11 DIAGNOSIS — N951 Menopausal and female climacteric states: Secondary | ICD-10-CM

## 2018-01-11 DIAGNOSIS — D509 Iron deficiency anemia, unspecified: Secondary | ICD-10-CM

## 2018-01-11 DIAGNOSIS — K769 Liver disease, unspecified: Secondary | ICD-10-CM | POA: Diagnosis not present

## 2018-01-11 DIAGNOSIS — E039 Hypothyroidism, unspecified: Secondary | ICD-10-CM | POA: Diagnosis not present

## 2018-01-11 DIAGNOSIS — F3342 Major depressive disorder, recurrent, in full remission: Secondary | ICD-10-CM | POA: Diagnosis not present

## 2018-01-11 DIAGNOSIS — I5042 Chronic combined systolic (congestive) and diastolic (congestive) heart failure: Secondary | ICD-10-CM | POA: Diagnosis not present

## 2018-01-11 DIAGNOSIS — E782 Mixed hyperlipidemia: Secondary | ICD-10-CM

## 2018-01-11 MED ORDER — GABAPENTIN 100 MG PO CAPS
100.0000 mg | ORAL_CAPSULE | Freq: Every day | ORAL | 3 refills | Status: DC
Start: 2018-01-11 — End: 2018-05-06

## 2018-01-11 NOTE — Progress Notes (Signed)
Location:  [facility] clinic Provider:  Nneoma Harral L. Mariea Clonts, D.O., C.M.D.  Code Status: full code Goals of Care:  Advanced Directives 01/11/2018  Does Patient Have a Medical Advance Directive? No  Does patient want to make changes to medical advance directive? -  Would patient like information on creating a medical advance directive? No - Patient declined  Pre-existing out of facility DNR order (yellow form or pink MOST form) -     Chief Complaint  Patient presents with  . Medical Management of Chronic Issues    4MTH FOLLOW-UP    HPI: Patient is a 52 y.o. female seen today for medical management of chronic diseases.    LDL quite high.  Admits to eating desserts and red meats, but avoids fried foods, butter.  She is off her cholesterol medication due to her liver values being abnormal--was getting workup for autoimmune hepatitis.  Transaminases are now normal.  Proteins were elevated.  IgG was elevated, but IGM and IgA were normal.  Feels fine.  Not much shortness of breath.    TSH low, but borderline on current meds so will not change.  Recheck next time.  Mood has been ok.  Here and there has hallucinations, but not as often.  Stress levels are ok these days.  CHF:  EF much improved.  Off entresto now.  Feels the same.    Anemia:  Often forgets evening iron so h/h has declined since last time.  Monitor.  Tried low dose clonidine but laid off of it b/c side effect was sob.  Thinks it was trying to work.  Still has problems sleeping.  Menopausal symptoms more bothersome with hot flashes in the daytime.  Past Medical History:  Diagnosis Date  . Anemia   . Anxiety   . At risk for sudden cardiac death 11-04-2012  . CHF - Combined Systolic + Diastolic Dysfunction. EF of 15-20% with Grade II diastolic dysfunction on echo 10/27/12 10/28/2012  . Coronary artery disease   . Depression   . Dyspnea   . Fibroids   . Heart murmur   . Hypertension   . Hypothyroidism   . S/P cardiac  catheterization, 04-Nov-2012, normal coronaries with minimal luminal irregularities in RCA system 10/30/2012  . Thyroid disease    hypothyroidism    Past Surgical History:  Procedure Laterality Date  . AORTIC VALVE REPLACEMENT N/A 03/12/2017   Procedure: AORTIC VALVE REPLACEMENT (AVR) with Mitral Annuloplasty;  Surgeon: Gaye Pollack, MD;  Location: [facility] OR;  Service: Open Heart Surgery;  Laterality: N/A;  . LEFT AND RIGHT HEART CATHETERIZATION WITH CORONARY ANGIOGRAM N/A 2012-11-04   Procedure: LEFT AND RIGHT HEART CATHETERIZATION WITH CORONARY ANGIOGRAM;  Surgeon: Leonie Man, MD;  Location: [facility] CATH LAB;  Service: Cardiovascular;  Laterality: N/A;  . MITRAL VALVE REPAIR N/A 03/12/2017   Procedure: MITRAL VALVE REPAIR (MVR);  Surgeon: Gaye Pollack, MD;  Location: [place];  Service: Open Heart Surgery;  Laterality: N/A;  mitral annuloplasty  . MULTIPLE EXTRACTIONS WITH ALVEOLOPLASTY N/A 01/21/2017   Procedure: Extraction of tooth #'s 4-13 with alveoloplasty;  Surgeon: Lenn Cal, DDS;  Location: [place];  Service: Oral Surgery;  Laterality: N/A;  . RIGHT/LEFT HEART CATH AND CORONARY ANGIOGRAPHY N/A 01/09/2017   Procedure: Right/Left Heart Cath and Coronary Angiography;  Surgeon: Troy Sine, MD;  Location: [place] CV LAB;  Service: Cardiovascular;  Laterality: N/A;  . TEE WITHOUT CARDIOVERSION N/A 03/12/2017   Procedure: TRANSESOPHAGEAL ECHOCARDIOGRAM (TEE);  Surgeon: Gilford Raid  K, MD;  Location: [place];  Service: Open Heart Surgery;  Laterality: N/A;    Allergies  Allergen Reactions  . Codeine Hives    Outpatient Encounter Medications as of 01/11/2018  Medication Sig  . aspirin EC 81 MG EC tablet Take 1 tablet (81 mg total) by mouth daily.  Marland Kitchen buPROPion (WELLBUTRIN XL) 150 MG 24 hr tablet TAKE 1 TABLET BY MOUTH ONCE DAILY  . carvedilol (COREG) 6.25 MG tablet Take 1 tablet (6.25 mg total) by mouth 2 (two) times daily.  . ferrous sulfate 325 (65 FE) MG EC tablet Take 325 mg by mouth  2 (two) times daily.  . fluticasone (FLONASE) 50 MCG/ACT nasal spray Place 1 spray into both nostrils daily as needed for allergies or rhinitis.  . furosemide (LASIX) 40 MG tablet Take 1 tablet (40 mg total) by mouth daily. Monday and thurday take 60mg   . levothyroxine (SYNTHROID, LEVOTHROID) 112 MCG tablet TAKE 1 TABLET BY MOUTH ONCE DAILY BEFORE BREAKFAST  . lisinopril (PRINIVIL,ZESTRIL) 20 MG tablet Take 1 tablet (20 mg total) by mouth daily.  . mirtazapine (REMERON) 15 MG tablet Take 1 tablet (15 mg total) by mouth at bedtime.  . Multiple Vitamins-Minerals (ONE-A-DAY MENOPAUSE FORMULA) TABS Take 1 tablet by mouth daily.  Marland Kitchen spironolactone (ALDACTONE) 25 MG tablet Take 1 tablet (25 mg total) by mouth daily.  . traZODone (DESYREL) 150 MG tablet Take 1 tablet (150 mg total) by mouth at bedtime.   No facility-administered encounter medications on file as of 01/11/2018.     Review of Systems:  Review of Systems  Constitutional: Negative for chills, fever and malaise/fatigue.  HENT: Negative for congestion and hearing loss.   Eyes: Positive for double vision. Negative for blurred vision.       Glasses, has reading glasses also  Respiratory: Negative for cough and shortness of breath.   Cardiovascular: Negative for chest pain, palpitations and leg swelling.  Gastrointestinal: Negative for abdominal pain, blood in stool, constipation and melena.  Genitourinary: Negative for dysuria.  Musculoskeletal: Negative for falls and joint pain.  Skin: Negative for itching and rash.  Neurological: Negative for dizziness and loss of consciousness.  Endo/Heme/Allergies: Does not bruise/bleed easily.  Psychiatric/Behavioral: Positive for hallucinations. Negative for memory loss, substance abuse and suicidal ideas. The patient has insomnia. The patient is not nervous/anxious.     Health Maintenance  Topic Date Due  . MAMMOGRAM  02/26/2018  . INFLUENZA VACCINE  03/04/2018  . PAP SMEAR  01/25/2019  .  Fecal DNA (Cologuard)  02/14/2019  . TETANUS/TDAP  01/24/2026  . Hepatitis C Screening  Completed  . HIV Screening  Completed    Physical Exam: Vitals:   01/11/18 0832  BP: 112/78  Pulse: 69  Temp: (!) 97.5 F (36.4 C)  TempSrc: Oral  SpO2: 99%  Weight: 146 lb (66.2 kg)  Height: 5\' 5"  (1.651 m)   Body mass index is 24.3 kg/m. Physical Exam  Constitutional: She is oriented to person, place, and time. No distress.  HENT:  Head: Normocephalic and atraumatic.  Eyes:  glasses  Cardiovascular: Normal rate, regular rhythm and intact distal pulses.  Murmur heard. Pulmonary/Chest: Effort normal and breath sounds normal. No respiratory distress.  Abdominal: Bowel sounds are normal.  Musculoskeletal: Normal range of motion.  Neurological: She is alert and oriented to person, place, and time.  Skin: Skin is warm and dry.  Psychiatric: She has a normal mood and affect.    Labs reviewed: Basic Metabolic Panel: Recent Labs  03/12/17 1857  03/13/17 0306 03/13/17 1652  09/08/17 0824 10/27/17 0900 11/04/17 0000 01/07/18 0900  NA  --    < > 136  --    < >  --  142 141 137  K  --    < > 5.2*  --    < >  --  3.3* 3.9 5.0  CL  --    < > 107  --    < >  --  102 100 103  CO2  --   --  23  --    < >  --  26 25 29   GLUCOSE  --    < > 124*  --    < >  --  89 84 81  BUN  --    < > 11  --    < >  --  9 22 32*  CREATININE 0.68   < > 0.65 0.66   < >  --  0.80 1.04* 1.51*  CALCIUM  --   --  8.3*  --    < >  --  9.4 10.2 9.7  MG 2.9*  --  2.3 2.0  --   --   --   --   --   TSH  --   --   --   --   --  0.88  --   --  0.38*   < > = values in this interval not displayed.   Liver Function Tests: Recent Labs    06/17/17 1121 07/06/17 1225 08/27/17 1001 01/07/18 0900  AST 97* 135* 50* 25  25  ALT 82* 108* 36* 13  13  ALKPHOS 141* 104 82  --   BILITOT 0.2 0.3 0.3 0.3  0.3  PROT 8.6* 9.1* 9.7* 8.7*  8.7*  ALBUMIN 3.7 4.0 4.0  --    No results for input(s): LIPASE, AMYLASE in the  last 8760 hours. No results for input(s): AMMONIA in the last 8760 hours. CBC: Recent Labs    09/08/17 0824 09/14/17 1116 01/07/18 0900  WBC 4.4 3.8* 6.2  NEUTROABS 2,037 1.9 3,559  HGB 12.1 13.3 11.4*  HCT 37.8 41.0 34.8*  MCV 82.5 85.6 83.3  PLT 169 166 165   Lipid Panel: Recent Labs    06/01/17 0931 01/07/18 0900  CHOL 138 196  HDL 49 50*  LDLCALC 72 127*  TRIG 84 89  CHOLHDL 2.8 3.9   Lab Results  Component Value Date   HGBA1C 6.1 (H) 03/10/2017    Assessment/Plan 1. Chronic combined systolic and diastolic congestive heart failure ([place]) -echo with improvement of EF  - Lipid panel; Future  2. Hypothyroidism, unspecified type -cont levothyroxine therapy at current dose, recheck tsh - TSH; Future  3. Recurrent major depressive disorder, in full remission ([place]) -mood much better lately, feels good, does still get some hallucinations, but not to where she wants medication, cont remeron  4. Mixed hyperlipidemia - off statin due to transaminitis, which did resolve - Lipid panel; Future  5. Microcytic anemia -cont iron supplement, be more faithful with this, no blood seen in stool or vaginal bleeding lately  6. Liver disease, unspecified -being followed at hepatology for this and seems right now her immunoglobulins, liver panel, protein levels are just being monitored b/c she did not meet criteria for autoimmune hepatitis or PBC, PSC -IgG is elevated but IgA and IgM normal, globulins and total protein elevated, anemia worse, renal function down (but likely hydration related)  7. Menopausal hot flushes - will try gabapentin (NEURONTIN) 100 MG capsule; Take 1 capsule (100 mg total) by mouth at bedtime.  Dispense: 30 capsule; Refill: 3--if effective, but still getting some, may increase frequency (also if it does not make her sleepy or off balance)  Labs/tests ordered:   Orders Placed This Encounter  Procedures  . TSH    Standing Status:   Future    Standing  Expiration Date:   09/13/2018  . Lipid panel    Standing Status:   Future    Standing Expiration Date:   09/13/2018    Next appt:  05/24/2018 with labs before  [place]. Maddilyn Campus, D.O. [facility] [address], [place] [address] Cell Phone (Mon-Fri 8am-5pm):  [phone] On Call:  [phone] & follow prompts after 5pm & weekends Office Phone:  [phone] Office Fax:  [phone]

## 2018-01-11 NOTE — Patient Instructions (Addendum)
Your second shingles shot can be given anytime between now and 05/08/18.  Fat and Cholesterol Restricted Diet High levels of fat and cholesterol in your blood may lead to various health problems, such as diseases of the heart, blood vessels, gallbladder, liver, and pancreas. Fats are concentrated sources of energy that come in various forms. Certain types of fat, including saturated fat, may be harmful in excess. Cholesterol is a substance needed by your body in small amounts. Your body makes all the cholesterol it needs. Excess cholesterol comes from the food you eat. When you have high levels of cholesterol and saturated fat in your blood, health problems can develop because the excess fat and cholesterol will gather along the walls of your blood vessels, causing them to narrow. Choosing the right foods will help you control your intake of fat and cholesterol. This will help keep the levels of these substances in your blood within normal limits and reduce your risk of disease. What is my plan? Your health care provider recommends that you:  Limit your fat intake  Limit the amount of cholesterol in your diet.  Eat 20-30 grams of fiber each day.  What types of fat should I choose?  Choose healthy fats more often. Choose monounsaturated and polyunsaturated fats, such as olive and canola oil, flaxseeds, walnuts, almonds, and seeds.  Eat more omega-3 fats. Good choices include salmon, mackerel, sardines, tuna, flaxseed oil, and ground flaxseeds. Aim to eat fish at least two times a week.  Limit saturated fats. Saturated fats are primarily found in animal products, such as meats, butter, and cream. Plant sources of saturated fats include palm oil, palm kernel oil, and coconut oil.  Avoid foods with partially hydrogenated oils in them. These contain trans fats. Examples of foods that contain trans fats are stick margarine, some tub margarines, cookies, crackers, and other baked goods. What general  guidelines do I need to follow? These guidelines for healthy eating will help you control your intake of fat and cholesterol:  Check food labels carefully to identify foods with trans fats or high amounts of saturated fat.  Fill one half of your plate with vegetables and green salads.  Fill one fourth of your plate with whole grains. Look for the word "whole" as the first word in the ingredient list.  Fill one fourth of your plate with lean protein foods.  Limit fruit to two servings a day. Choose fruit instead of juice.  Eat more foods that contain fiber, such as apples, broccoli, carrots, beans, peas, and barley.  Eat more home-cooked food and less restaurant, buffet, and fast food.  Limit or avoid alcohol.  Limit foods high in starch and sugar.  Limit fried foods.  Cook foods using methods other than frying. Baking, boiling, grilling, and broiling are all great options.  Lose weight if you are overweight. Losing just 5-10% of your initial body weight can help your overall health and prevent diseases such as diabetes and heart disease.  What foods can I eat? Grains  Whole grains, such as whole wheat or whole grain breads, crackers, cereals, and pasta. Unsweetened oatmeal, bulgur, barley, quinoa, or brown rice. Corn or whole wheat flour tortillas. Vegetables  Fresh or frozen vegetables (raw, steamed, roasted, or grilled). Green salads. Fruits  All fresh, canned (in natural juice), or frozen fruits. Meats and other protein foods  Ground beef (85% or leaner), grass-fed beef, or beef trimmed of fat. Skinless chicken or [place]. Ground chicken or [place]. Pork trimmed of  fat. All fish and seafood. Eggs. Dried beans, peas, or lentils. Unsalted nuts or seeds. Unsalted canned or dry beans. Dairy  Low-fat dairy products, such as skim or 1% milk, 2% or reduced-fat cheeses, low-fat ricotta or cottage cheese, or plain low-fat yo Fats and oils  Tub margarines without trans fats. Light  or reduced-fat mayonnaise and salad dressings. Avocado. Olive, canola, sesame, or safflower oils. Natural peanut or almond butter (choose ones without added sugar and oil). The items listed above may not be a complete list of recommended foods or beverages. Contact your dietitian for more options. Foods to avoid Grains  White bread. White pasta. White rice. Cornbread. Bagels, pastries, and croissants. Crackers that contain trans fat. Vegetables  White potatoes. Corn. Creamed or fried vegetables. Vegetables in a cheese sauce. Fruits  Dried fruits. Canned fruit in light or heavy syrup. Fruit juice. Meats and other protein foods  Fatty cuts of meat. Ribs, chicken wings, bacon, sausage, bologna, salami, chitterlings, fatback, hot dogs, bratwurst, and packaged luncheon meats. Liver and organ meats. Dairy  Whole or 2% milk, cream, half-and-half, and cream cheese. Whole milk cheeses. Whole-fat or sweetened yogurt. Full-fat cheeses. Nondairy creamers and whipped toppings. Processed cheese, cheese spreads, or cheese curds. Beverages  Alcohol. Sweetened drinks (such as sodas, lemonade, and fruit drinks or punches). Fats and oils  Butter, stick margarine, lard, shortening, ghee, or bacon fat. Coconut, palm kernel, or palm oils. Sweets and desserts  Corn syrup, sugars, honey, and molasses. Candy. Jam and jelly. Syrup. Sweetened cereals. Cookies, pies, cakes, donuts, muffins, and ice cream. The items listed above may not be a complete list of foods and beverages to avoid. Contact your dietitian for more information. This information is not intended to replace advice given to you by your health care provider. Make sure you discuss any questions you have with your health care provider. Document Released: 07/21/2005 Document Revised: 08/11/2014 Document Reviewed: 10/19/2013 Elsevier Interactive Patient Education  [employer].

## 2018-02-23 IMAGING — CR DG CHEST 2V
2 series · 2 of 2 positions shown · non-contrast
Comparison: 03/17/2017

CLINICAL DATA: Valve replacement

EXAM:
CHEST  2 VIEW

[w chest pa]
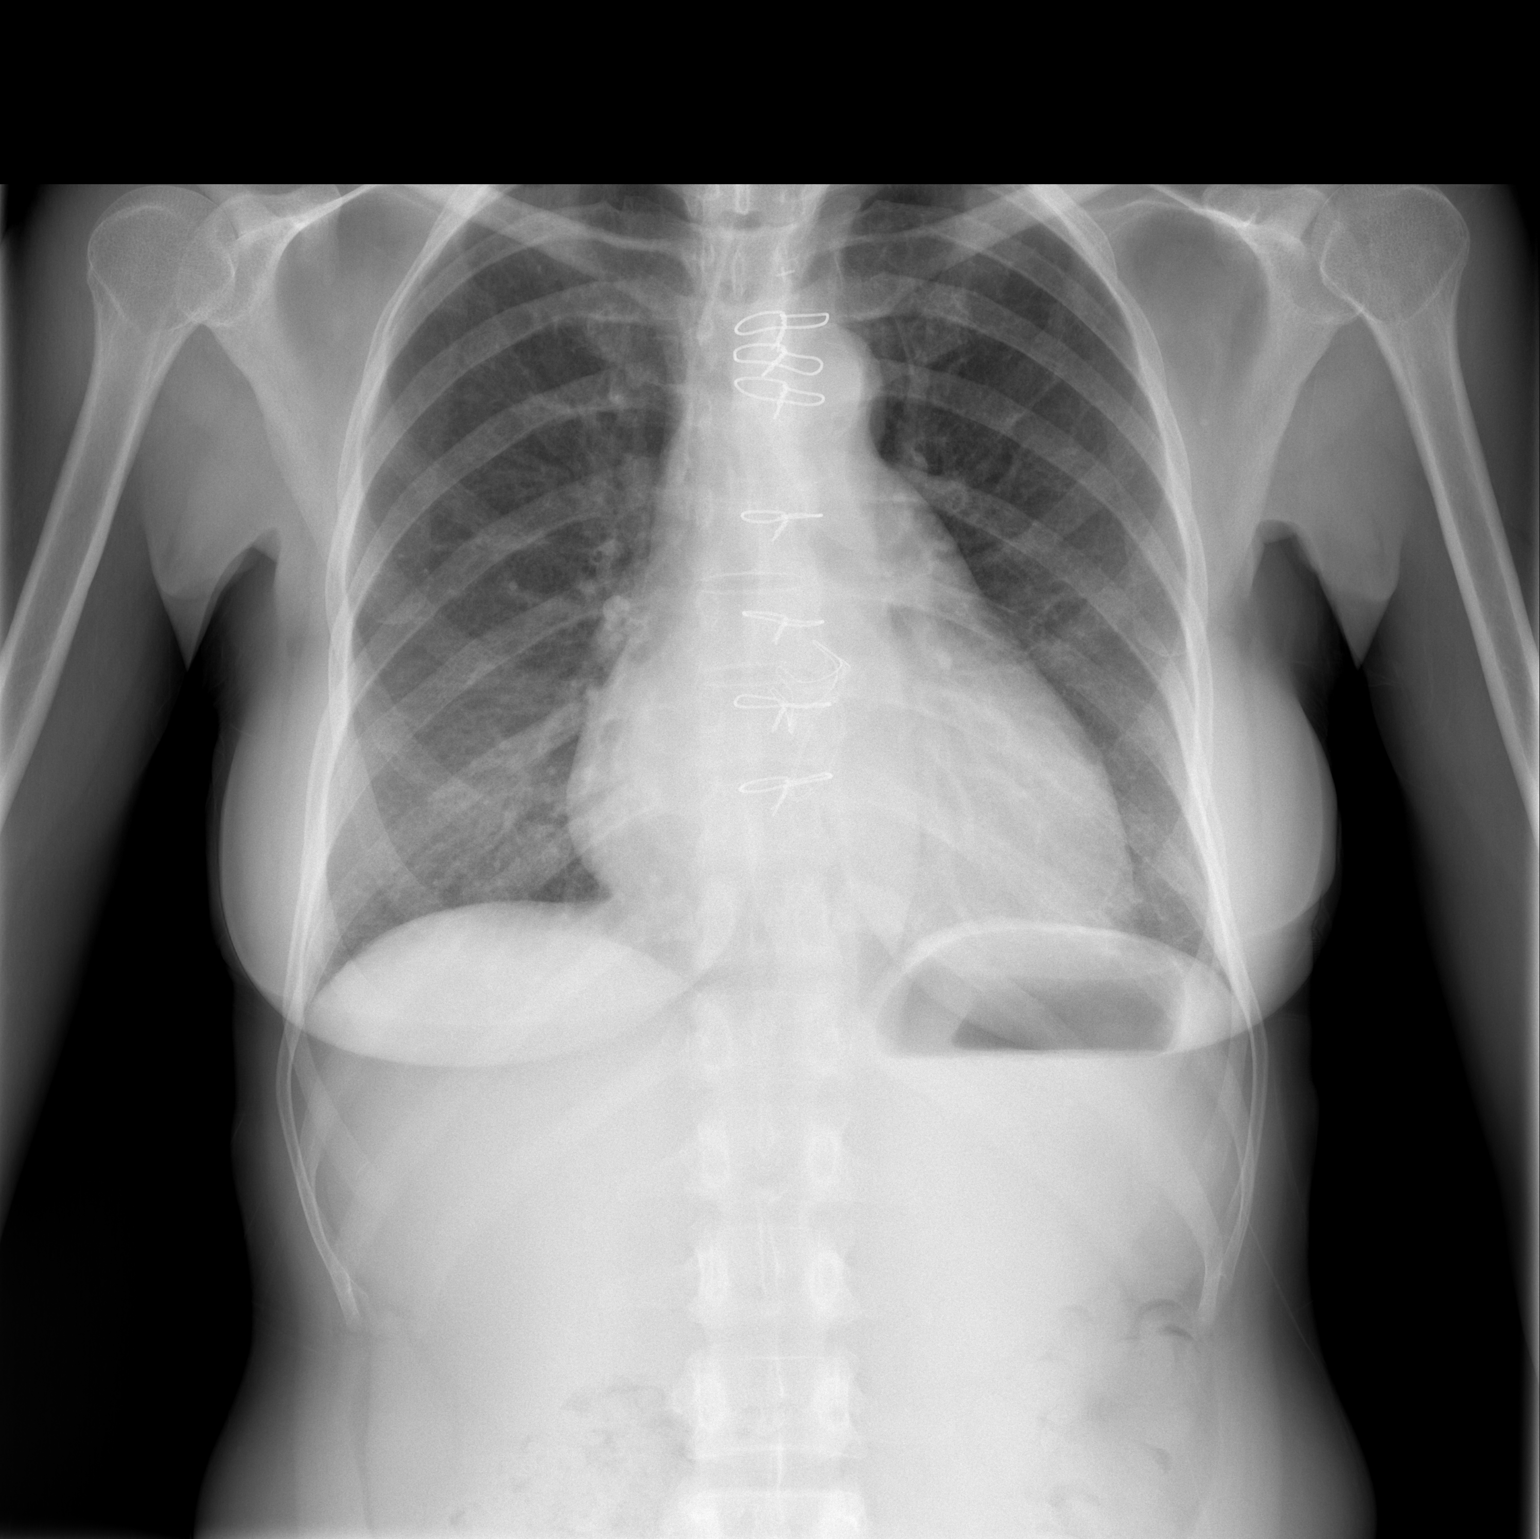

[w chest lat]
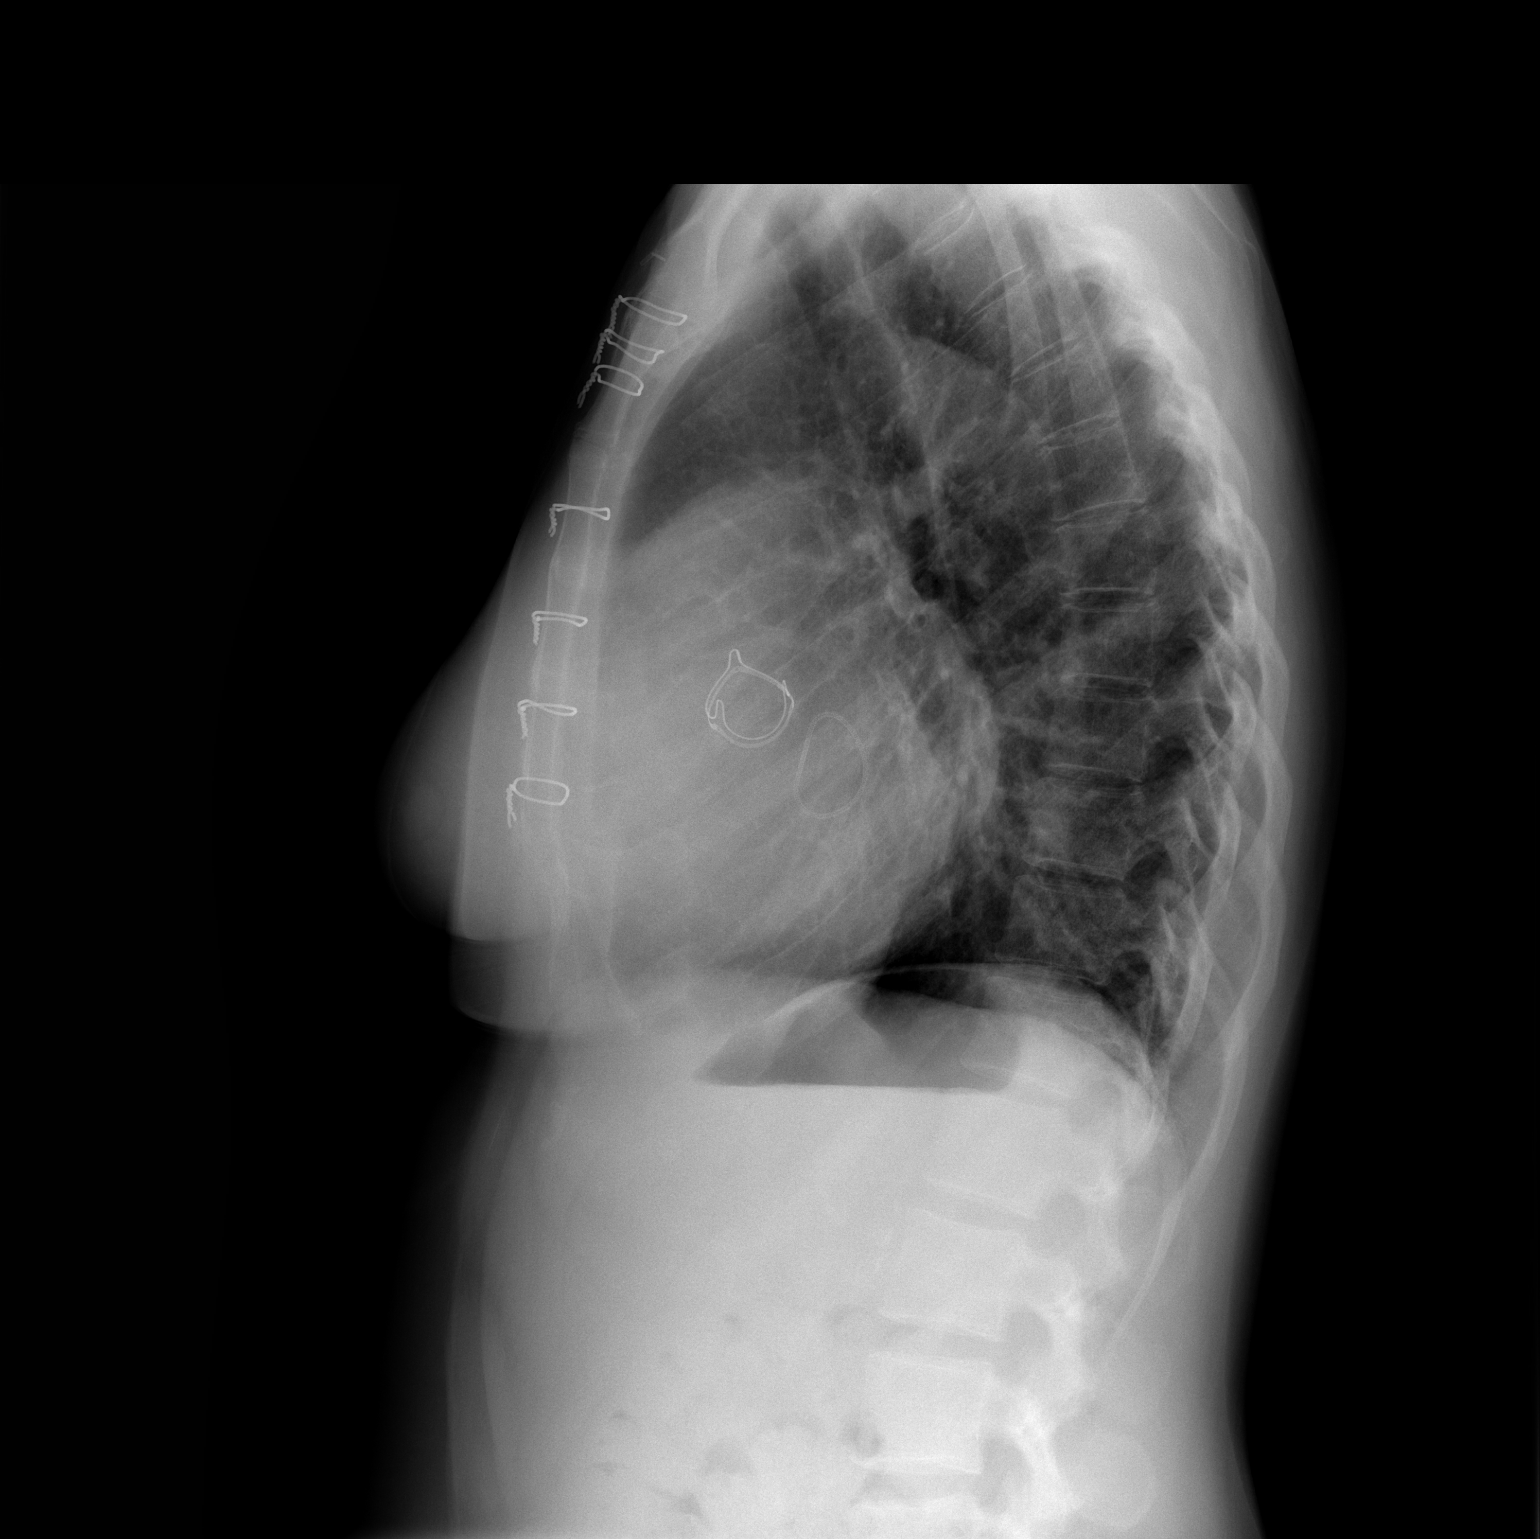

[2 of 2 positions shown; findings below may reference images not displayed]

FINDINGS: Prior median sternotomy and aortic valve and mitral valve
replacement. Cardiomegaly. No confluent opacities, effusions or
pneumothorax.
IMPRESSION: Cardiomegaly.  No active disease.

## 2018-03-02 ENCOUNTER — Telehealth: Payer: Self-pay

## 2018-03-02 NOTE — Telephone Encounter (Signed)
Called and spoke to pt. Scheduled her for 3rd Twinrix on 03-11-18 at 8:30am. Order is in.

## 2018-03-02 NOTE — Telephone Encounter (Signed)
-----   Message from Larina Bras, [place] sent at 10/09/2017  9:14 AM EST ----- PT NEEDS TWINRIX #3 STANDARD AROUND 03/11/18

## 2018-03-11 ENCOUNTER — Ambulatory Visit (INDEPENDENT_AMBULATORY_CARE_PROVIDER_SITE_OTHER): Payer: PPO | Admitting: Gastroenterology

## 2018-03-11 DIAGNOSIS — Z23 Encounter for immunization: Secondary | ICD-10-CM

## 2018-03-11 DIAGNOSIS — K759 Inflammatory liver disease, unspecified: Secondary | ICD-10-CM

## 2018-04-05 ENCOUNTER — Other Ambulatory Visit: Payer: Self-pay | Admitting: Nurse Practitioner

## 2018-04-22 ENCOUNTER — Encounter: Payer: Self-pay | Admitting: Cardiology

## 2018-04-22 ENCOUNTER — Ambulatory Visit: Payer: PPO | Admitting: Cardiology

## 2018-04-22 ENCOUNTER — Encounter (INDEPENDENT_AMBULATORY_CARE_PROVIDER_SITE_OTHER): Payer: Self-pay

## 2018-04-22 VITALS — BP 112/82 | HR 78 | Ht 65.0 in | Wt 146.4 lb

## 2018-04-22 DIAGNOSIS — E782 Mixed hyperlipidemia: Secondary | ICD-10-CM | POA: Diagnosis not present

## 2018-04-22 DIAGNOSIS — I5042 Chronic combined systolic (congestive) and diastolic (congestive) heart failure: Secondary | ICD-10-CM

## 2018-04-22 DIAGNOSIS — Z9889 Other specified postprocedural states: Secondary | ICD-10-CM | POA: Diagnosis not present

## 2018-04-22 DIAGNOSIS — Z952 Presence of prosthetic heart valve: Secondary | ICD-10-CM | POA: Diagnosis not present

## 2018-04-22 DIAGNOSIS — R945 Abnormal results of liver function studies: Secondary | ICD-10-CM | POA: Diagnosis not present

## 2018-04-22 DIAGNOSIS — R7989 Other specified abnormal findings of blood chemistry: Secondary | ICD-10-CM

## 2018-04-22 MED ORDER — LISINOPRIL 10 MG PO TABS: 10.0000 mg | ORAL_TABLET | Freq: Every day | ORAL | 3 refills | Status: DC

## 2018-04-22 NOTE — Patient Instructions (Signed)
Medication Instructions:  The current medical regimen is effective;  continue present plan and medications.  Follow-Up: Follow up in 6 months with Lori Gerhardt, NP.  You will receive a letter in the mail 2 months before you are due.  Please call us when you receive this letter to schedule your follow up appointment.  Follow up in 1 year with Dr. Skains.  You will receive a letter in the mail 2 months before you are due.  Please call us when you receive this letter to schedule your follow up appointment.  If you need a refill on your cardiac medications before your next appointment, please call your pharmacy.  Thank you for choosing [facility]!!     

## 2018-04-22 NOTE — Progress Notes (Signed)
Cardiology Office Note:    Date:  04/22/2018   ID:  Miranda [name], DOB [birth year], MRN [id number]  PCP:  Gayland Curry, DO  Cardiologist:  Candee Furbish, MD   Referring MD: Gayland Curry, DO     History of Present Illness:    Miranda [name] is a 52 y.o. female with a hx of AVR with Dr. Cyndia Bent on 03/12/2017 with 23 mm Edwards Magna-Ease pericardial valve and MV annuloplasty with 28 mm Sorin 3D MEMO ring on8/9/18 (62months of coumadin post then ASA) here for follow-up of aortic valve replacement, cardiomyopathy.  EF remained low post op from 15 to 35%. Also with elevated LFT's 135/100 AST/ ALT (7 2016, 30 on 01/24/17, 51 on 03/10/17 2 days pre-op, 72 on 06/01/17, 109 06/08/17, 108 07/06/17.)  Her EF was 30-35% on prior check.  November 2018.  We are trying to titrate her medications.  With carvedilol at 12.5 she was dizzy.  Lisinopril 10.  Spironolactone 25.  Feeling tired at times.  Minimal shortness of breath with activity, no syncope, no bleeding.  Depressed mood.  Her ALT has decreased.  She had been worked up for possible autoimmune disease  Affecting her liver.  04/22/2018- chief complaint-follow-up cardiomyopathy.  Last visit was with Truitt Merle, NP on 01/04/2018.  After switching to [facility] EF was improved back to 50 to 55%.  Unfortunately, her insurance denied the [facility] and she had switch back to ACE inhibitor due to excessive cost. She was walking 30 minutes a day 5 times a week salt restriction no chest pain no breathing problems been fishing caught a 7 pound bass.  She saw GI sounds like no clear etiology for her prior elevation of LFTs.  She is off of statin because of this.   Past Medical History:  Diagnosis Date  . Anemia   . Anxiety   . At risk for sudden cardiac death 11-24-12  . CHF - Combined Systolic + Diastolic Dysfunction. EF of 15-20% with Grade II diastolic dysfunction on echo 10/27/12 10/28/2012  . Coronary artery disease   . Depression   . Dyspnea   .  Fibroids   . Heart murmur   . Hypertension   . Hypothyroidism   . S/P cardiac catheterization, 11-24-12, normal coronaries with minimal luminal irregularities in RCA system 10/30/2012  . Thyroid disease    hypothyroidism    Past Surgical History:  Procedure Laterality Date  . AORTIC VALVE REPLACEMENT N/A 03/12/2017   Procedure: AORTIC VALVE REPLACEMENT (AVR) with Mitral Annuloplasty;  Surgeon: Gaye Pollack, MD;  [facility];  Service: Open Heart Surgery;  Laterality: N/A;  . LEFT AND RIGHT HEART CATHETERIZATION WITH CORONARY ANGIOGRAM N/A 24-Nov-2012   Procedure: LEFT AND RIGHT HEART CATHETERIZATION WITH CORONARY ANGIOGRAM;  Surgeon: Leonie Man, MD;  Location: [facility] CATH LAB;  Service: Cardiovascular;  Laterality: N/A;  . MITRAL VALVE REPAIR N/A 03/12/2017   Procedure: MITRAL VALVE REPAIR (MVR);  Surgeon: Gaye Pollack, MD;  Location: [place];  Service: Open Heart Surgery;  Laterality: N/A;  mitral annuloplasty  . MULTIPLE EXTRACTIONS WITH ALVEOLOPLASTY N/A 01/21/2017   Procedure: Extraction of tooth #'s 4-13 with alveoloplasty;  Surgeon: Lenn Cal, DDS;  Location: [place];  Service: Oral Surgery;  Laterality: N/A;  . RIGHT/LEFT HEART CATH AND CORONARY ANGIOGRAPHY N/A 01/09/2017   Procedure: Right/Left Heart Cath and Coronary Angiography;  Surgeon: [place] Sine, MD;  Location: [place] CV LAB;  Service: Cardiovascular;  Laterality: N/A;  . TEE  WITHOUT CARDIOVERSION N/A 03/12/2017   Procedure: TRANSESOPHAGEAL ECHOCARDIOGRAM (TEE);  Surgeon: Gaye Pollack, MD;  Location: [place];  Service: Open Heart Surgery;  Laterality: N/A;    Current Medications: Current Meds  Medication Sig  . aspirin EC 81 MG EC tablet Take 1 tablet (81 mg total) by mouth daily.  Marland Kitchen buPROPion (WELLBUTRIN XL) 150 MG 24 hr tablet TAKE 1 TABLET BY MOUTH ONCE DAILY  . carvedilol (COREG) 6.25 MG tablet Take 1 tablet (6.25 mg total) by mouth 2 (two) times daily.  . ferrous sulfate 325 (65 FE) MG EC tablet Take  325 mg by mouth 2 (two) times daily.  . fluticasone (FLONASE) 50 MCG/ACT nasal spray Place 1 spray into both nostrils daily as needed for allergies or rhinitis.  . furosemide (LASIX) 40 MG tablet Take 1 tablet (40 mg total) by mouth daily. Monday and thurday take 60mg   . gabapentin (NEURONTIN) 100 MG capsule Take 1 capsule (100 mg total) by mouth at bedtime.  Marland Kitchen levothyroxine (SYNTHROID, LEVOTHROID) 112 MCG tablet TAKE 1 TABLET BY MOUTH ONCE DAILY BEFORE BREAKFAST  . lisinopril (PRINIVIL,ZESTRIL) 10 MG tablet Take 1 tablet (10 mg total) by mouth daily.  . mirtazapine (REMERON) 15 MG tablet Take 1 tablet (15 mg total) by mouth at bedtime.  Marland Kitchen spironolactone (ALDACTONE) 25 MG tablet Take 1 tablet (25 mg total) by mouth daily.  . traZODone (DESYREL) 150 MG tablet Take 1 tablet (150 mg total) by mouth at bedtime.  . [DISCONTINUED] lisinopril (PRINIVIL,ZESTRIL) 20 MG tablet Take 10 mg by mouth daily.     Allergies:   Codeine   Social History   Socioeconomic History  . Marital status: Single    Spouse name: Not on file  . Number of children: 1  . Years of education: 12th  . Highest education level: Not on file  Occupational History  . Occupation: unemployed    Employer: [employer]  . Financial resource strain: Not hard at all  . Food insecurity:    Worry: Never true    Inability: Never true  . Transportation needs:    Medical: No    Non-medical: No  Tobacco Use  . Smoking status: Former Smoker    Packs/day: 0.50    Years: 5.00    Pack years: 2.50    Types: Cigars, Cigarettes    Last attempt to quit: 08/04/1978    Years since quitting: 39.7  . Smokeless tobacco: Never Used  . Tobacco comment: 1 cigar daily-quit 10/24/12    Substance and Sexual Activity  . Alcohol use: Yes    Alcohol/week: 3.0 standard drinks    Types: 3 Glasses of wine per week    Comment: per month  . Drug use: Yes    Frequency: 5.0 times per week    Types: Marijuana    Comment: few weeks  ago last  . Sexual activity: Not Currently  Lifestyle  . Physical activity:    Days per week: 5 days    Minutes per session: 20 min  . Stress: To some extent  Relationships  . Social connections:    Talks on phone: More than three times a week    Gets together: More than three times a week    Attends religious service: Never    Active member of club or organization: No    Attends meetings of clubs or organizations: Never    Relationship status: Never married  Other Topics Concern  . Not on file  Social History Narrative   Patient lives at home alone. (Condo)   Caffeine use: 1/2 soda daily   Diet   Martial status:single   Is it one or more stories? Yes   How many persons live in your home? 1   Do you have any pets in your home? No   Current or past profession: Biochemist, clinical   Do you exercise? No   Do you have a living will? No    Do you have a DNR form? No    Do you have signed POA/HPOA forms? No      Family History: The patient's family history includes Arthritis in her paternal uncle; Bone cancer in her maternal uncle and paternal grandfather; Breast cancer in her cousin; Colon polyps in her mother; Heart disease in her maternal grandmother and mother; Hypertension in her father, maternal grandmother, and mother; Liver disease in her mother; Stroke in her father.  ROS:   Please see the history of present illness.   No fevers chills nausea vomiting syncope bleeding All other review of systems negative  EKGs/Labs/Other Studies Reviewed:    The following studies were reviewed today: Prior hospital records, lab work, EKG reviewed  Echocardiogram 10/19/2017: -EF 50 to 55%.  EKG: 04/22/2018-sinus rhythm 78 T wave inversion V3 through V6 as well as 1, 2, aVF.  No change from prior.  Personally reviewed and interpreted.  Recent Labs: 07/06/2017: NT-Pro BNP 240 01/07/2018: ALT 13; ALT 13; BUN 32; Creat 1.51; Hemoglobin 11.4; Platelets 165; Potassium 5.0; Sodium 137; TSH 0.38    Recent Lipid Panel    Component Value Date/Time   CHOL 196 01/07/2018 0900   CHOL 138 06/01/2017 0931   TRIG 89 01/07/2018 0900   HDL 50 (L) 01/07/2018 0900   HDL 49 06/01/2017 0931   CHOLHDL 3.9 01/07/2018 0900   VLDL 11 09/26/2016 1019   LDLCALC 127 (H) 01/07/2018 0900    Physical Exam:    VS:  BP 112/82   Pulse 78   Ht 5\' 5"  (1.651 m)   Wt 146 lb 6.4 oz (66.4 kg)   LMP 01/17/2015   SpO2 97%   BMI 24.36 kg/m     Wt Readings from Last 3 Encounters:  04/22/18 146 lb 6.4 oz (66.4 kg)  01/11/18 146 lb (66.2 kg)  01/04/18 145 lb 12.8 oz (66.1 kg)     GEN: Well nourished, well developed, in no acute distress  HEENT: normal  Neck: no JVD, carotid bruits, or masses Cardiac: RRR; no murmurs, rubs, or gallops,no edema Chest scar Respiratory:  clear to auscultation bilaterally, normal work of breathing GI: soft, nontender, nondistended, + BS MS: no deformity or atrophy  Skin: warm and dry, no rash Neuro:  Alert and Oriented x 3, Strength and sensation are intact Psych: euthymic mood, full affect    ASSESSMENT:    1. Chronic combined systolic and diastolic congestive heart failure ([place])   2. Aortic valve replaced   3. Mixed hyperlipidemia   4. S/P mitral valve repair   5. Elevated liver function tests    PLAN:    In order of problems listed above:  Chronic systolic heart failure EF 30-35%-now 50 to 55% on 10/19/2017, nonischemic - Entresto worked very well but it was too expensive for her.  Her insurance denied it.  This seemed to increase her EF to 55 to 50%.  In the past she felt dizzy on carvedilol 12.5.  Continuing with lisinopril now, however we will give her a  10 mg prescription because she has been cutting this in half because of some lower blood pressures at home.  No need for ICD.  Currently class I.  Status post aortic valve replacement, mitral valve repair -Continue to encouraged antibiotics for dental procedures, no other significant changes.   Echocardiogram reassuring personally reviewed.  Abnormal LFTs - LFTs, mild improvement on last assay.  ALT decreased from 100 down to 36.  No bilirubinemia.  Dr. Havery Moros is questioning whether or not she has an autoimmune situation.  Overall there is no clear etiology for this.  Thankfully, ALT currently 13.   Medication Adjustments/Labs and Tests Ordered: Current medicines are reviewed at length with the patient today.  Concerns regarding medicines are outlined above.  Orders Placed This Encounter  Procedures  . EKG 12-Lead   Meds ordered this encounter  Medications  . lisinopril (PRINIVIL,ZESTRIL) 10 MG tablet    Sig: Take 1 tablet (10 mg total) by mouth daily.    Dispense:  90 tablet    Refill:  3    Signed, Candee Furbish, MD  04/22/2018 8:50 AM    [place]

## 2018-05-06 ENCOUNTER — Other Ambulatory Visit: Payer: Self-pay | Admitting: Internal Medicine

## 2018-05-06 DIAGNOSIS — N951 Menopausal and female climacteric states: Secondary | ICD-10-CM

## 2018-05-21 ENCOUNTER — Other Ambulatory Visit: Payer: PPO

## 2018-05-21 DIAGNOSIS — I5042 Chronic combined systolic (congestive) and diastolic (congestive) heart failure: Secondary | ICD-10-CM

## 2018-05-21 DIAGNOSIS — E039 Hypothyroidism, unspecified: Secondary | ICD-10-CM

## 2018-05-21 DIAGNOSIS — E782 Mixed hyperlipidemia: Secondary | ICD-10-CM | POA: Diagnosis not present

## 2018-05-21 LAB — LIPID PANEL
Cholesterol: 204 mg/dL — ABNORMAL HIGH (ref <200)
HDL: 50 mg/dL — ABNORMAL LOW (ref >50)
LDL Cholesterol (Calc): 138 mg/dL (calc) — ABNORMAL HIGH
Non-HDL Cholesterol (Calc): 154 mg/dL (calc) — ABNORMAL HIGH (ref <130)
Total CHOL/HDL Ratio: 4.1 (calc) (ref <5.0)
Triglycerides: 67 mg/dL (ref <150)

## 2018-05-21 LAB — TSH: TSH: 0.22 mIU/L — ABNORMAL LOW

## 2018-05-24 ENCOUNTER — Ambulatory Visit (INDEPENDENT_AMBULATORY_CARE_PROVIDER_SITE_OTHER): Payer: PPO | Admitting: Internal Medicine

## 2018-05-24 ENCOUNTER — Encounter: Payer: Self-pay | Admitting: Internal Medicine

## 2018-05-24 VITALS — BP 130/80 | HR 72 | Temp 97.6°F | Ht 65.0 in | Wt 147.0 lb

## 2018-05-24 DIAGNOSIS — Z1231 Encounter for screening mammogram for malignant neoplasm of breast: Secondary | ICD-10-CM | POA: Diagnosis not present

## 2018-05-24 DIAGNOSIS — E782 Mixed hyperlipidemia: Secondary | ICD-10-CM

## 2018-05-24 DIAGNOSIS — Z23 Encounter for immunization: Secondary | ICD-10-CM

## 2018-05-24 DIAGNOSIS — N951 Menopausal and female climacteric states: Secondary | ICD-10-CM | POA: Diagnosis not present

## 2018-05-24 DIAGNOSIS — E039 Hypothyroidism, unspecified: Secondary | ICD-10-CM | POA: Diagnosis not present

## 2018-05-24 DIAGNOSIS — F3342 Major depressive disorder, recurrent, in full remission: Secondary | ICD-10-CM | POA: Diagnosis not present

## 2018-05-24 DIAGNOSIS — I5042 Chronic combined systolic (congestive) and diastolic (congestive) heart failure: Secondary | ICD-10-CM | POA: Diagnosis not present

## 2018-05-24 DIAGNOSIS — D509 Iron deficiency anemia, unspecified: Secondary | ICD-10-CM

## 2018-05-24 MED ORDER — LEVOTHYROXINE SODIUM 100 MCG PO TABS: 100.0000 ug | ORAL_TABLET | Freq: Every day | ORAL | 3 refills | Status: DC

## 2018-05-24 MED ORDER — GABAPENTIN 300 MG PO CAPS: 300.0000 mg | ORAL_CAPSULE | Freq: Every day | ORAL | 3 refills | Status: DC

## 2018-05-24 MED ORDER — ROSUVASTATIN CALCIUM 10 MG PO TABS: 10.0000 mg | ORAL_TABLET | Freq: Every day | ORAL | 3 refills | Status: DC

## 2018-05-24 MED ORDER — FERROUS SULFATE 325 (65 FE) MG PO TBEC: 325.0000 mg | DELAYED_RELEASE_TABLET | Freq: Two times a day (BID) | ORAL | 3 refills | Status: AC

## 2018-05-24 MED ORDER — FLUTICASONE PROPIONATE 50 MCG/ACT NA SUSP: 1.0000 | Freq: Every day | NASAL | 3 refills | Status: DC | PRN

## 2018-05-24 NOTE — Progress Notes (Signed)
Location:  [facility] clinic Provider:  Yaslyn Cumby L. Mariea Clonts, D.O., C.M.D.  Code Status: full code Goals of Care:  Advanced Directives 01/11/2018  Does Patient Have a Medical Advance Directive? No  Does patient want to make changes to medical advance directive? -  Would patient like information on creating a medical advance directive? No - Patient declined  Pre-existing out of facility DNR order (yellow form or pink MOST form) -    Chief Complaint  Patient presents with  . Medical Management of Chronic Issues    10mth follow-up, discuss labs    HPI: Patient is a 52 y.o. female seen today for medical management of chronic diseases.    Heart is doing better.  EF up to 55-60.  Not short of breath.    Reports being regular with her levothyroxine.    Gabapentin helped her hot flashes until august when they returned.    She is going to switch back to a different iron supplement--levels were better with the old brand.  Feels like this is a contributor to the hot flashes.  Got her flu shot today.  Sleeping ok.  Mood is a whole lot better.  Has a few days here and there, but not bad.    Past Medical History:  Diagnosis Date  . Anemia   . Anxiety   . At risk for sudden cardiac death 11-20-12  . CHF - Combined Systolic + Diastolic Dysfunction. EF of 15-20% with Grade II diastolic dysfunction on echo 10/27/12 10/28/2012  . Coronary artery disease   . Depression   . Dyspnea   . Fibroids   . Heart murmur   . Hypertension   . Hypothyroidism   . S/P cardiac catheterization, 2012-11-20, normal coronaries with minimal luminal irregularities in RCA system 10/30/2012  . Thyroid disease    hypothyroidism    Past Surgical History:  Procedure Laterality Date  . AORTIC VALVE REPLACEMENT N/A 03/12/2017   Procedure: AORTIC VALVE REPLACEMENT (AVR) with Mitral Annuloplasty;  Surgeon: Gaye Pollack, MD;  [facility];  Service: Open Heart Surgery;  Laterality: N/A;  . LEFT AND RIGHT HEART  CATHETERIZATION WITH CORONARY ANGIOGRAM N/A 2012/11/20   Procedure: LEFT AND RIGHT HEART CATHETERIZATION WITH CORONARY ANGIOGRAM;  Surgeon: Leonie Man, MD;  Location: [facility] CATH LAB;  Service: Cardiovascular;  Laterality: N/A;  . MITRAL VALVE REPAIR N/A 03/12/2017   Procedure: MITRAL VALVE REPAIR (MVR);  Surgeon: Gaye Pollack, MD;  Location: [place];  Service: Open Heart Surgery;  Laterality: N/A;  mitral annuloplasty  . MULTIPLE EXTRACTIONS WITH ALVEOLOPLASTY N/A 01/21/2017   Procedure: Extraction of tooth #'s 4-13 with alveoloplasty;  Surgeon: Lenn Cal, DDS;  Location: [place];  Service: Oral Surgery;  Laterality: N/A;  . RIGHT/LEFT HEART CATH AND CORONARY ANGIOGRAPHY N/A 01/09/2017   Procedure: Right/Left Heart Cath and Coronary Angiography;  Surgeon: [place] Sine, MD;  Location: [place] CV LAB;  Service: Cardiovascular;  Laterality: N/A;  . TEE WITHOUT CARDIOVERSION N/A 03/12/2017   Procedure: TRANSESOPHAGEAL ECHOCARDIOGRAM (TEE);  Surgeon: Gaye Pollack, MD;  Location: [place];  Service: Open Heart Surgery;  Laterality: N/A;    Allergies  Allergen Reactions  . Codeine Hives    Outpatient Encounter Medications as of 05/24/2018  Medication Sig  . aspirin EC 81 MG EC tablet Take 1 tablet (81 mg total) by mouth daily.  Marland Kitchen buPROPion (WELLBUTRIN XL) 150 MG 24 hr tablet TAKE 1 TABLET BY MOUTH ONCE DAILY  . carvedilol (COREG) 6.25  MG tablet Take 1 tablet (6.25 mg total) by mouth 2 (two) times daily.  . ferrous sulfate 325 (65 FE) MG EC tablet Take 325 mg by mouth 2 (two) times daily.  . fluticasone (FLONASE) 50 MCG/ACT nasal spray Place 1 spray into both nostrils daily as needed for allergies or rhinitis.  . furosemide (LASIX) 40 MG tablet Take 1 tablet (40 mg total) by mouth daily. Monday and thurday take 60mg   . gabapentin (NEURONTIN) 100 MG capsule TAKE 1 CAPSULE BY MOUTH AT BEDTIME  . levothyroxine (SYNTHROID, LEVOTHROID) 112 MCG tablet TAKE 1 TABLET BY MOUTH ONCE DAILY BEFORE  BREAKFAST  . lisinopril (PRINIVIL,ZESTRIL) 10 MG tablet Take 1 tablet (10 mg total) by mouth daily.  . mirtazapine (REMERON) 15 MG tablet Take 1 tablet (15 mg total) by mouth at bedtime.  . Multiple Vitamins-Minerals (CENTRUM ADULTS PO) Take by mouth daily.  Marland Kitchen spironolactone (ALDACTONE) 25 MG tablet Take 1 tablet (25 mg total) by mouth daily.  . traZODone (DESYREL) 150 MG tablet Take 1 tablet (150 mg total) by mouth at bedtime.   No facility-administered encounter medications on file as of 05/24/2018.     Review of Systems:  Review of Systems  Constitutional: Negative for chills, fever and malaise/fatigue.  HENT: Negative for congestion and hearing loss.   Eyes: Positive for double vision.       Glasses  Respiratory: Negative for cough and shortness of breath.   Cardiovascular: Negative for chest pain, palpitations and leg swelling.  Gastrointestinal: Negative for abdominal pain, blood in stool, constipation, diarrhea, heartburn, melena, nausea and vomiting.  Genitourinary: Negative for dysuria.  Musculoskeletal: Negative for falls and joint pain.  Neurological: Negative for dizziness and loss of consciousness.  Endo/Heme/Allergies: Does not bruise/bleed easily.  Psychiatric/Behavioral: Negative for depression and memory loss. The patient does not have insomnia.        Spirits and sleep improved recently    Health Maintenance  Topic Date Due  . MAMMOGRAM  02/26/2018  . INFLUENZA VACCINE  03/04/2018  . PAP SMEAR  01/25/2019  . Fecal DNA (Cologuard)  02/14/2019  . TETANUS/TDAP  01/24/2026  . Hepatitis C Screening  Completed  . HIV Screening  Completed    Physical Exam: Vitals:   05/24/18 1031  Weight: 147 lb (66.7 kg)  Height: 5\' 5"  (1.651 m)   Body mass index is 24.46 kg/m. Physical Exam  Constitutional: She is oriented to person, place, and time. She appears well-developed. No distress.  Cardiovascular: Normal rate, regular rhythm and intact distal pulses.  Murmur  heard. Pulmonary/Chest: Effort normal and breath sounds normal. No respiratory distress.  Abdominal: Bowel sounds are normal.  Musculoskeletal: Normal range of motion.  Neurological: She is alert and oriented to person, place, and time.  Skin: Skin is warm and dry. Capillary refill takes less than 2 seconds.  Psychiatric:  Poor eye contact    Labs reviewed: Basic Metabolic Panel: Recent Labs    09/08/17 0824 10/27/17 0900 11/04/17 0000 01/07/18 0900 05/21/18 0948  NA  --  142 141 137  --   K  --  3.3* 3.9 5.0  --   CL  --  102 100 103  --   CO2  --  26 25 29   --   GLUCOSE  --  89 84 81  --   BUN  --  9 22 32*  --   CREATININE  --  0.80 1.04* 1.51*  --   CALCIUM  --  9.4 10.2 9.7  --  TSH 0.88  --   --  0.38* 0.22*   Liver Function Tests: Recent Labs    06/17/17 1121 07/06/17 1225 08/27/17 1001 01/07/18 0900  AST 97* 135* 50* 25  25  ALT 82* 108* 36* 13  13  ALKPHOS 141* 104 82  --   BILITOT 0.2 0.3 0.3 0.3  0.3  PROT 8.6* 9.1* 9.7* 8.7*  8.7*  ALBUMIN 3.7 4.0 4.0  --    No results for input(s): LIPASE, AMYLASE in the last 8760 hours. No results for input(s): AMMONIA in the last 8760 hours. CBC: Recent Labs    09/08/17 0824 09/14/17 1116 01/07/18 0900  WBC 4.4 3.8* 6.2  NEUTROABS 2,037 1.9 3,559  HGB 12.1 13.3 11.4*  HCT 37.8 41.0 34.8*  MCV 82.5 85.6 83.3  PLT 169 166 165   Lipid Panel: Recent Labs    06/01/17 0931 01/07/18 0900 05/21/18 0948  CHOL 138 196 204*  HDL 49 50* 50*  LDLCALC 72 127* 138*  TRIG 84 89 67  CHOLHDL 2.8 3.9 4.1   Lab Results  Component Value Date   HGBA1C 6.1 (H) 03/10/2017    Assessment/Plan 1. Chronic combined systolic and diastolic congestive heart failure ([place]) -controlled well now after valve surgery  2. Menopausal hot flushes - increase gabapentin dose to better control symptoms due to initial response to 100mg  dose - gabapentin (NEURONTIN) 300 MG capsule; Take 1 capsule (300 mg total) by mouth at  bedtime.  Dispense: 90 capsule; Refill: 3  3. Breast cancer screening by mammogram - MM DIGITAL SCREENING BILATERAL; Future   4. Hypothyroidism, unspecified type - decrease levothyroxine to 121mcg down from 119mcg when finishes current supply  - TSH; Future  5. Mixed hyperlipidemia - restart on crestor generic as LDL above goal - Lipid panel; Future  6. Microcytic anemia - change back to rx iron from otc she was taking which was not improving her iron, recheck cbc - CBC with Differential/Platelet; Future  7. Recurrent major depressive disorder, in full remission ([place]) - remains improved, cont trazodone, wellbutrin  8. Need for influenza vaccination - Flu vaccine HIGH DOSE PF (Fluzone High dose) due to chronic diseases  Labs/tests ordered:   Orders Placed This Encounter  Procedures  . MM DIGITAL SCREENING BILATERAL    HTA? PF: 02/27/16@ [facility]/    Standing Status:   Future    Standing Expiration Date:   07/25/2019    Order Specific Question:   Reason for exam:    Answer:   routine breast cancer screening    Order Specific Question:   Preferred imaging location?    Answer:   [facility]  . Flu vaccine HIGH DOSE PF (Fluzone High dose)  . TSH    Standing Status:   Future    Standing Expiration Date:   05/25/2019  . Lipid panel    Standing Status:   Future    Standing Expiration Date:   05/25/2019  . CBC with Differential/Platelet    Standing Status:   Future    Standing Expiration Date:   05/25/2019    Next appt:  11/08/2018   Rasean Joos L. Kimbly Eanes, D.O. [facility] [address] [place], [place] [address] Cell Phone (Mon-Fri 8am-5pm):  [phone] On Call:  [phone] & follow prompts after 5pm & weekends Office Phone:  [phone] Office Fax:  [phone]

## 2018-06-14 ENCOUNTER — Telehealth: Payer: Self-pay

## 2018-06-14 NOTE — Telephone Encounter (Signed)
Called to f/u on mammogram that was previously ordered. No answer, left vm with callback number

## 2018-07-11 ENCOUNTER — Other Ambulatory Visit: Payer: Self-pay | Admitting: Nurse Practitioner

## 2018-07-11 ENCOUNTER — Other Ambulatory Visit: Payer: Self-pay | Admitting: Internal Medicine

## 2018-07-11 DIAGNOSIS — I5042 Chronic combined systolic (congestive) and diastolic (congestive) heart failure: Secondary | ICD-10-CM

## 2018-08-16 ENCOUNTER — Other Ambulatory Visit: Payer: Self-pay | Admitting: Cardiology

## 2018-08-16 DIAGNOSIS — I5042 Chronic combined systolic (congestive) and diastolic (congestive) heart failure: Secondary | ICD-10-CM

## 2018-08-18 ENCOUNTER — Other Ambulatory Visit: Payer: Self-pay | Admitting: Cardiology

## 2018-08-25 ENCOUNTER — Other Ambulatory Visit: Payer: Self-pay

## 2018-08-25 NOTE — Patient Outreach (Signed)
  [place] [facility]) Care Management Chronic Special Needs Program  08/25/2018  Name: Wynonia Medero DOB: Jan 09, 1966  MRN: [id number]  Ms. Samaiya Bruster is enrolled in a chronic special needs plan for Heart Failure. Client called with no answer No answer and HIPAA compliant message left. Plan for  2rd outreach call in one week if call not returned Chronic care management coordinator will attempt outreach in 1 week.   Peter Garter RN, Jackquline Denmark, CDE Chronic Care Management Coordinator [place] [facility] Care Management [phone]

## 2018-09-01 ENCOUNTER — Other Ambulatory Visit: Payer: Self-pay

## 2018-09-01 NOTE — Patient Outreach (Signed)
  [place] [facility]) Care Management Chronic Special Needs Program  09/01/2018  Name: Miranda [name] DOB: 03-30-1966  MRN: [id number]  [name] is enrolled in a chronic special needs plan for Heart Failure. Client called with no answer No answer and HIPAA compliant message left. Plan for  3rd outreach call in one week if call not returned Chronic care management coordinator will attempt 3rd outreach in 1 week.   Peter Garter RN, Jackquline Denmark, CDE Chronic Care Management Coordinator [place] [facility] Care Management [phone]

## 2018-09-08 ENCOUNTER — Other Ambulatory Visit: Payer: Self-pay

## 2018-09-08 NOTE — Patient Outreach (Signed)
  [facility]) Care Management Chronic Special Needs Program  09/08/2018  Name: Miranda [name] DOB: 11-14-1965  MRN: [id number]  Miranda [name] is enrolled in a chronic special needs plan for Heart Failure. Chronic Care Management Coordinator telephoned client to review health risk assessment and to develop individualized care plan.  Introduced the chronic care management program, importance of client participation, and taking their care plan to all provider appointments and inpatient facilities.  Reviewed the transition of care process and possible referral to community care management.  Subjective:Client states she not been having problems with her heart recently.  States she only weights every few days and sometimes forgets to write down her weight.  States she has not been walking much due to the weather.  States she does follow a low salt diet.    Goals Addressed            This Visit's Progress   .  Acknowledge receipt of Advanced Directive package       Client has forms from [place] office Please review forms and contact me at [phone]  if you need help with the forms.  A Education officer, museum can help you if needed    . Client understands the importance of follow-up with providers by attending scheduled visits      . Client will report no fall or injuries in the next 6 months.       Please review the Check for Safety brochure     . Client will report no worsening of symptoms related to heart disease within the next 6 months       . Client will verbalize knowledge of self management of Hypertension as evidences by BP reading of 140/90 or less; or as defined by provider      . maintain lifestyle   On track    Starting 11/03/2016 I would like to maintain my lifestyle.    . Maintain timely refills of Heart Failure medication as prescribed within the year       . Obtain annual  Lipid Profile, LDL-C      . Record weight daily for the next 6 months (pt-stated)      Please write your daily weight in the [place] and take log to your doctors appointments    . Visit Primary Care Provider or Cardiologist at least 2 times per year       Discussed pharmacy referral for medication review for >8 medications but declines at this time.  Declines social work referral for assistance with Advanced Directives(she received forms from her provider)  Plan:  Send successful outreach letter with a copy of their individualized care plan, Send individual care plan to provider and Send educational material: Check for Safety Brochure, HTN, [place] with HF zones and weight log, [employer] flyer  Chronic care management coordination will outreach in:  6 Months  Will refer client to:  Declines pharmacy and social work referral at this time   Fayne Mediate, Jackquline [place], [place] [facility] [phone]

## 2018-09-16 ENCOUNTER — Encounter: Payer: Self-pay | Admitting: Family

## 2018-09-24 ENCOUNTER — Other Ambulatory Visit: Payer: HMO

## 2018-09-27 ENCOUNTER — Encounter: Payer: Self-pay | Admitting: Internal Medicine

## 2018-09-27 ENCOUNTER — Ambulatory Visit (INDEPENDENT_AMBULATORY_CARE_PROVIDER_SITE_OTHER): Payer: HMO | Admitting: Internal Medicine

## 2018-09-27 VITALS — BP 120/80 | HR 70 | Temp 97.6°F | Ht 65.0 in | Wt 144.0 lb

## 2018-09-27 DIAGNOSIS — I5042 Chronic combined systolic (congestive) and diastolic (congestive) heart failure: Secondary | ICD-10-CM | POA: Diagnosis not present

## 2018-09-27 DIAGNOSIS — E039 Hypothyroidism, unspecified: Secondary | ICD-10-CM

## 2018-09-27 DIAGNOSIS — F3342 Major depressive disorder, recurrent, in full remission: Secondary | ICD-10-CM

## 2018-09-27 DIAGNOSIS — D509 Iron deficiency anemia, unspecified: Secondary | ICD-10-CM | POA: Diagnosis not present

## 2018-09-27 DIAGNOSIS — E782 Mixed hyperlipidemia: Secondary | ICD-10-CM | POA: Diagnosis not present

## 2018-09-27 DIAGNOSIS — J449 Chronic obstructive pulmonary disease, unspecified: Secondary | ICD-10-CM | POA: Diagnosis not present

## 2018-09-27 LAB — CBC WITH DIFFERENTIAL/PLATELET
Absolute Monocytes: 346 cells/uL (ref 200–950)
Basophils Absolute: 19 cells/uL (ref 0–200)
Basophils Relative: 0.4 %
Eosinophils Absolute: 67 cells/uL (ref 15–500)
Eosinophils Relative: 1.4 %
HCT: 39.3 % (ref 35.0–45.0)
Hemoglobin: 12.8 g/dL (ref 11.7–15.5)
Lymphs Abs: 1637 cells/uL (ref 850–3900)
MCH: 27.9 pg (ref 27.0–33.0)
MCHC: 32.6 g/dL (ref 32.0–36.0)
MCV: 85.6 fL (ref 80.0–100.0)
MPV: 11.6 fL (ref 7.5–12.5)
Monocytes Relative: 7.2 %
Neutro Abs: 2731 cells/uL (ref 1500–7800)
Neutrophils Relative %: 56.9 %
Platelets: 183 10*3/uL (ref 140–400)
RBC: 4.59 10*6/uL (ref 3.80–5.10)
RDW: 14.2 % (ref 11.0–15.0)
Total Lymphocyte: 34.1 %
WBC: 4.8 10*3/uL (ref 3.8–10.8)

## 2018-09-27 LAB — LIPID PANEL
Cholesterol: 192 mg/dL (ref <200)
HDL: 62 mg/dL (ref >=50)
LDL Cholesterol (Calc): 111 mg/dL (calc) — ABNORMAL HIGH
Non-HDL Cholesterol (Calc): 130 mg/dL (calc) — ABNORMAL HIGH (ref <130)
Total CHOL/HDL Ratio: 3.1 (calc) (ref <5.0)
Triglycerides: 86 mg/dL (ref <150)

## 2018-09-27 LAB — TSH: TSH: 1.82 mIU/L

## 2018-09-27 NOTE — Progress Notes (Signed)
Location:  [facility] clinic Provider:  Cynai Skeens L. Mariea Clonts, D.O., C.M.D.  Goals of Care:  Advanced Directives 09/08/2018  Does Patient Have a Medical Advance Directive? No  Does patient want to make changes to medical advance directive? -  Would patient like information on creating a medical advance directive? No - Patient declined  Pre-existing out of facility DNR order (yellow form or pink MOST form) -   Chief Complaint  Patient presents with  . Medical Management of Chronic Issues    56mth follow-up    HPI: Patient is a 53 y.o. [name] seen today for medical management of chronic diseases.    Stopped gabapentin--has not needed.  She is good at taking her thyroid medicine.  Skips her cholesterol medicine here and there.  She forgets the evening pill--advised she could take with her coreg and iron in evening or at least 30 mins after her synthroid.    Mood doing ok.  Some struggling here and there after her mom passed.  She is not going back to that deep depression she was in before.  She's doing better with the wellbutrin.  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    A little sob if she climbs the stairs--says she's paid attention to it.  Has had a little tingling sensation in here left chest.  Lasts just 30 seconds and resolves.  Can be anytime.  No dizziness.    She coughs, but does not wheeze.  Weight is down 3 lbs since October.  Appetite is not good.  Not eating a lot.    Sleeping ok with trazodone and remeron.  Knees are hurting some.  They swell at times.  Normally does walking when weather is good.  Insurance is going to send her info to get to the gym to use the indoor track.    Past Medical History:  Diagnosis Date  . Anemia   . Anxiety   . At risk for sudden cardiac death Oct 31, 2012  . CHF - Combined Systolic + Diastolic Dysfunction. EF of 15-20% with Grade II diastolic dysfunction on echo 10/27/12 10/28/2012  . Coronary artery disease   . Depression   . Dyspnea   . Fibroids    . Heart murmur   . Hypertension   . Hypothyroidism   . S/P cardiac catheterization, 2012-10-31, normal coronaries with minimal luminal irregularities in RCA system 10/30/2012  . Thyroid disease    hypothyroidism    Past Surgical History:  Procedure Laterality Date  . AORTIC VALVE REPLACEMENT N/A 03/12/2017   Procedure: AORTIC VALVE REPLACEMENT (AVR) with Mitral Annuloplasty;  Surgeon: Gaye Pollack, MD;  Location: [facility] OR;  Service: Open Heart Surgery;  Laterality: N/A;  . LEFT AND RIGHT HEART CATHETERIZATION WITH CORONARY ANGIOGRAM N/A October 31, 2012   Procedure: LEFT AND RIGHT HEART CATHETERIZATION WITH CORONARY ANGIOGRAM;  Surgeon: Leonie Man, MD;  Location: [facility] CATH LAB;  Service: Cardiovascular;  Laterality: N/A;  . MITRAL VALVE REPAIR N/A 03/12/2017   Procedure: MITRAL VALVE REPAIR (MVR);  Surgeon: Gaye Pollack, MD;  Location: [facility];  Service: Open Heart Surgery;  Laterality: N/A;  mitral annuloplasty  . MULTIPLE EXTRACTIONS WITH ALVEOLOPLASTY N/A 01/21/2017   Procedure: Extraction of tooth #'s 4-13 with alveoloplasty;  Surgeon: Lenn Cal, DDS;  Location: [facility];  Service: Oral Surgery;  Laterality: N/A;  . RIGHT/LEFT HEART CATH AND CORONARY ANGIOGRAPHY N/A 01/09/2017   Procedure: Right/Left Heart Cath and Coronary Angiography;  Surgeon: Troy Sine, MD;  Location:  [facility] INVASIVE CV LAB;  Service: Cardiovascular;  Laterality: N/A;  . TEE WITHOUT CARDIOVERSION N/A 03/12/2017   Procedure: TRANSESOPHAGEAL ECHOCARDIOGRAM (TEE);  Surgeon: Gaye Pollack, MD;  Location: [facility];  Service: Open Heart Surgery;  Laterality: N/A;    Allergies  Allergen Reactions  . Codeine Hives    Outpatient Encounter Medications as of 09/27/2018  Medication Sig  . aspirin EC 81 MG EC tablet Take 1 tablet (81 mg total) by mouth daily.  Marland Kitchen buPROPion (WELLBUTRIN XL) 150 MG 24 hr tablet TAKE 1 TABLET BY MOUTH ONCE DAILY  . carvedilol (COREG) 6.25 MG tablet TAKE 1 TABLET BY MOUTH TWICE DAILY  . ferrous  sulfate 325 (65 FE) MG EC tablet Take 1 tablet (325 mg total) by mouth 2 (two) times daily.  . fluticasone (FLONASE) 50 MCG/ACT nasal spray Place 1 spray into both nostrils daily as needed for allergies or rhinitis.  . furosemide (LASIX) 40 MG tablet TAKE 1 TABLET BY MOUTH ONCE DAILY(EXCEPT ON MON AND THURS TAKE 1&1/2 TABLETS)  . levothyroxine (SYNTHROID, LEVOTHROID) 100 MCG tablet Take 1 tablet (100 mcg total) by mouth daily before breakfast.  . lisinopril (PRINIVIL,ZESTRIL) 10 MG tablet Take 1 tablet (10 mg total) by mouth daily.  . mirtazapine (REMERON) 15 MG tablet Take 1 tablet (15 mg total) by mouth at bedtime.  . Multiple Vitamins-Minerals (CENTRUM ADULTS PO) Take by mouth daily.  . rosuvastatin (CRESTOR) 10 MG tablet Take 1 tablet (10 mg total) by mouth daily.  Marland Kitchen spironolactone (ALDACTONE) 25 MG tablet TAKE 1 TABLET BY MOUTH ONCE DAILY  . traZODone (DESYREL) 150 MG tablet TAKE 1 TABLET BY MOUTH AT BEDTIME  . [DISCONTINUED] gabapentin (NEURONTIN) 300 MG capsule Take 1 capsule (300 mg total) by mouth at bedtime. (Patient not taking: Reported on 09/08/2018)   No facility-administered encounter medications on file as of 09/27/2018.     Review of Systems:  Review of Systems  Constitutional: Negative for chills, fever, malaise/fatigue and weight loss.  Eyes: Negative for blurred vision and discharge.  Respiratory: Negative for cough and shortness of breath.   Cardiovascular: Negative for chest pain, palpitations and leg swelling.       Tingling in left chest  Gastrointestinal: Negative for abdominal pain.  Genitourinary: Negative for dysuria.  Musculoskeletal: Positive for joint pain. Negative for falls.       Some bilateral knee pain  Neurological: Negative for dizziness and loss of consciousness.  Endo/Heme/Allergies: Does not bruise/bleed easily.  Psychiatric/Behavioral: Negative for depression and memory loss. The patient is not nervous/anxious and does not have insomnia.     Health  Maintenance  Topic Date Due  . MAMMOGRAM  02/26/2018  . PAP SMEAR-Modifier  01/25/2019  . Fecal DNA (Cologuard)  02/14/2019  . TETANUS/TDAP  01/24/2026  . INFLUENZA VACCINE  Completed  . Hepatitis C Screening  Completed  . HIV Screening  Completed    Physical Exam: Vitals:   09/27/18 0932  BP: 120/Miranda  Pulse: 70  Temp: 97.6 F (36.4 C)  TempSrc: Oral  SpO2: 95%  Weight: 144 lb (65.3 kg)  Height: 5\' 5"  (1.651 m)   Body mass index is 23.96 kg/m. Physical Exam Vitals signs reviewed.  Constitutional:      Appearance: Normal appearance.  HENT:     Head: Normocephalic and atraumatic.  Cardiovascular:     Rate and Rhythm: Normal rate and regular rhythm.     Pulses: Normal pulses.     Heart sounds: Normal heart sounds.  Pulmonary:  Effort: Pulmonary effort is normal.     Breath sounds: Normal breath sounds. No wheezing, rhonchi or rales.  Abdominal:     General: Bowel sounds are normal. There is no distension.     Palpations: Abdomen is soft. There is no mass.     Tenderness: There is no abdominal tenderness.  Musculoskeletal: Normal range of motion.        General: Swelling present.     Comments: Mild edema bilateral knees  Skin:    General: Skin is warm and dry.  Neurological:     General: No focal deficit present.     Mental Status: She is alert and oriented to person, place, and time.  Psychiatric:        Mood and Affect: Mood normal.     Labs reviewed: Basic Metabolic Panel: Recent Labs    10/27/17 0900 11/04/17 0000 01/07/18 0900 05/21/18 0948  NA 142 141 137  --   K 3.3* 3.9 5.0  --   CL 102 100 103  --   CO2 26 25 29   --   GLUCOSE 89 84 81  --   BUN 9 22 32*  --   CREATININE 0.Miranda 1.04* 1.51*  --   CALCIUM 9.4 10.2 9.7  --   TSH  --   --  0.38* 0.22*   Liver Function Tests: Recent Labs    01/07/18 0900  AST 25  25  ALT 13  13  BILITOT 0.3  0.3  PROT 8.7*  8.7*   No results for input(s): LIPASE, AMYLASE in the last 8760 hours. No  results for input(s): AMMONIA in the last 8760 hours. CBC: Recent Labs    01/07/18 0900  WBC 6.2  NEUTROABS 3,559  HGB 11.4*  HCT 34.8*  MCV 83.3  PLT 165   Lipid Panel: Recent Labs    01/07/18 0900 05/21/18 0948  CHOL 196 204*  HDL 50* 50*  LDLCALC 127* 138*  TRIG 89 67  CHOLHDL 3.9 4.1   Lab Results  Component Value Date   HGBA1C 6.1 (H) 03/10/2017    Assessment/Plan 1. Chronic combined systolic and diastolic congestive heart failure (HCC) -cont current regimen, clinically stable, now part of chronic care mgt thru [facility] for her CHF  2. Chronic obstructive pulmonary disease, unspecified COPD type ([place]) -not on on treatment for this  3. Recurrent major depressive disorder, in full remission ([place]) -cont wellbutrin, remeron and trazodone therapy which have kept her stable through her mother's death  4. Mixed hyperlipidemia -f/u lab -cont statin - Lipid panel  5. Microcytic anemia -f/u lab - CBC with Differential/Platelet  6. Hypothyroidism, unspecified type -cont current levothyroxine, f/u tsh today, has been good about taking it by her report - TSH  Labs/tests ordered:  No orders of the defined types were placed in this encounter.  Next appt:  11/09/2018   Alixandra Alfieri L. Chrystal Zeimet, D.O. [facility] [address], [place] [address] Cell Phone (Mon-Fri 8am-5pm):  [phone] On Call:  [phone] & follow prompts after 5pm & weekends Office Phone:  [phone] Office Fax:  [phone]

## 2018-09-30 ENCOUNTER — Encounter: Payer: Self-pay | Admitting: *Deleted

## 2018-10-09 ENCOUNTER — Other Ambulatory Visit: Payer: Self-pay | Admitting: Internal Medicine

## 2018-10-09 DIAGNOSIS — F5104 Psychophysiologic insomnia: Secondary | ICD-10-CM

## 2018-10-13 DIAGNOSIS — H53023 Refractive amblyopia, bilateral: Secondary | ICD-10-CM | POA: Diagnosis not present

## 2018-10-13 DIAGNOSIS — H2513 Age-related nuclear cataract, bilateral: Secondary | ICD-10-CM | POA: Diagnosis not present

## 2018-10-13 DIAGNOSIS — H5501 Congenital nystagmus: Secondary | ICD-10-CM | POA: Diagnosis not present

## 2018-10-19 ENCOUNTER — Telehealth: Payer: Self-pay | Admitting: *Deleted

## 2018-10-19 NOTE — Telephone Encounter (Signed)
S/w pt moved upcoming appt.

## 2018-10-19 NOTE — Telephone Encounter (Signed)
Pt has upcoming appt on march 23 [facility] wants to move out to April 27 if pt is ok.

## 2018-10-25 ENCOUNTER — Ambulatory Visit: Payer: HMO | Admitting: Nurse Practitioner

## 2018-11-08 ENCOUNTER — Ambulatory Visit: Payer: Self-pay

## 2018-11-08 ENCOUNTER — Encounter: Payer: Self-pay | Admitting: Family

## 2018-11-09 ENCOUNTER — Ambulatory Visit (INDEPENDENT_AMBULATORY_CARE_PROVIDER_SITE_OTHER): Payer: HMO | Admitting: Nurse Practitioner

## 2018-11-09 ENCOUNTER — Other Ambulatory Visit: Payer: Self-pay

## 2018-11-09 ENCOUNTER — Encounter: Payer: Self-pay | Admitting: Nurse Practitioner

## 2018-11-09 DIAGNOSIS — Z1231 Encounter for screening mammogram for malignant neoplasm of breast: Secondary | ICD-10-CM

## 2018-11-09 DIAGNOSIS — Z0000 Encounter for general adult medical examination without abnormal findings: Secondary | ICD-10-CM

## 2018-11-09 NOTE — Patient Instructions (Signed)
Miranda [name] , Thank you for taking time to come for your Medicare Wellness Visit. I appreciate your ongoing commitment to your health goals. Please review the following plan we discussed and let me know if I can assist you in the future.   Screening recommendations/referrals: Colonoscopy: due for cologuard July 2020 Mammogram due for this, order placed Bone Density :n/a Recommended yearly ophthalmology/optometry visit for glaucoma screening and checkup Recommended yearly dental visit for hygiene and checkup  Vaccinations: Influenza vaccine up to date Pneumococcal vaccine up to date Tdap vaccine up to date Shingles vaccine up to date    Advanced directives: to complete and bring to office so we can place in your file.   Conditions/risks identified: none.  Next appointment: 1 year for AWV  Preventive Care 40-64 Years, Female Preventive care refers to lifestyle choices and visits with your health care provider that can promote health and wellness. What does preventive care include?  A yearly physical exam. This is also called an annual well check.  Dental exams once or twice a year.  Routine eye exams. Ask your health care provider how often you should have your eyes checked.  Personal lifestyle choices, including:  Daily care of your teeth and gums.  Regular physical activity.  Eating a healthy diet.  Avoiding tobacco and drug use.  Limiting alcohol use.  Practicing safe sex.  Taking low-dose aspirin daily starting at age 54.  Taking vitamin and mineral supplements as recommended by your health care provider. What happens during an annual well check? The services and screenings done by your health care provider during your annual well check will depend on your age, overall health, lifestyle risk factors, and family history of disease. Counseling  Your health care provider may ask you questions about your:  Alcohol use.  Tobacco use.  Drug use.  Emotional  well-being.  Home and relationship well-being.  Sexual activity.  Eating habits.  Work and work Statistician.  Method of birth control.  Menstrual cycle.  Pregnancy history. Screening  You may have the following tests or measurements:  Height, weight, and BMI.  Blood pressure.  Lipid and cholesterol levels. These may be checked every 5 years, or more frequently if you are over 65 years old.  Skin check.  Lung cancer screening. You may have this screening every year starting at age 108 if you have a 30-pack-year history of smoking and currently smoke or have quit within the past 15 years.  Fecal occult blood test (FOBT) of the stool. You may have this test every year starting at age 71.  Flexible sigmoidoscopy or colonoscopy. You may have a sigmoidoscopy every 5 years or a colonoscopy every 10 years starting at age 59.  Hepatitis C blood test.  Hepatitis B blood test.  Sexually transmitted disease (STD) testing.  Diabetes screening. This is done by checking your blood sugar (glucose) after you have not eaten for a while (fasting). You may have this done every 1-3 years.  Mammogram. This may be done every 1-2 years. Talk to your health care provider about when you should start having regular mammograms. This may depend on whether you have a family history of breast cancer.  BRCA-related cancer screening. This may be done if you have a family history of breast, ovarian, tubal, or peritoneal cancers.  Pelvic exam and Pap test. This may be done every 3 years starting at age 48. Starting at age 60, this may be done every 5 years if you have a  Pap test in combination with an HPV test.  Bone density scan. This is done to screen for osteoporosis. You may have this scan if you are at high risk for osteoporosis. Discuss your test results, treatment options, and if necessary, the need for more tests with your health care provider. Vaccines  Your health care provider may recommend  certain vaccines, such as:  Influenza vaccine. This is recommended every year.  Tetanus, diphtheria, and acellular pertussis (Tdap, Td) vaccine. You may need a Td booster every 10 years.  Zoster vaccine. You may need this after age 32.  Pneumococcal 13-valent conjugate (PCV13) vaccine. You may need this if you have certain conditions and were not previously vaccinated.  Pneumococcal polysaccharide (PPSV23) vaccine. You may need one or two doses if you smoke cigarettes or if you have certain conditions. Talk to your health care provider about which screenings and vaccines you need and how often you need them. This information is not intended to replace advice given to you by your health care provider. Make sure you discuss any questions you have with your health care provider. Document Released: 08/17/2015 Document Revised: 04/09/2016 Document Reviewed: 05/22/2015 Elsevier Interactive Patient Education  2017 [place] Prevention in the Home Falls can cause injuries. They can happen to people of all ages. There are many things you can do to make your home safe and to help prevent falls. What can I do on the outside of my home?  Regularly fix the edges of walkways and driveways and fix any cracks.  Remove anything that might make you trip as you walk through a door, such as a raised step or threshold.  Trim any bushes or trees on the path to your home.  Use bright outdoor lighting.  Clear any walking paths of anything that might make someone trip, such as rocks or tools.  Regularly check to see if handrails are loose or broken. Make sure that both sides of any steps have handrails.  Any raised decks and porches should have guardrails on the edges.  Have any leaves, snow, or ice cleared regularly.  Use sand or salt on walking paths during winter.  Clean up any spills in your garage right away. This includes oil or grease spills. What can I do in the bathroom?  Use  night lights.  Install grab bars by the toilet and in the tub and shower. Do not use towel bars as grab bars.  Use non-skid mats or decals in the tub or shower.  If you need to sit down in the shower, use a plastic, non-slip stool.  Keep the floor dry. Clean up any water that spills on the floor as soon as it happens.  Remove soap buildup in the tub or shower regularly.  Attach bath mats securely with double-sided non-slip rug tape.  Do not have throw rugs and other things on the floor that can make you trip. What can I do in the bedroom?  Use night lights.  Make sure that you have a light by your bed that is easy to reach.  Do not use any sheets or blankets that are too big for your bed. They should not hang down onto the floor.  Have a firm chair that has side arms. You can use this for support while you get dressed.  Do not have throw rugs and other things on the floor that can make you trip. What can I do in the kitchen?  Clean  up any spills right away.  Avoid walking on wet floors.  Keep items that you use a lot in easy-to-reach places.  If you need to reach something above you, use a strong step stool that has a grab bar.  Keep electrical cords out of the way.  Do not use floor polish or wax that makes floors slippery. If you must use wax, use non-skid floor wax.  Do not have throw rugs and other things on the floor that can make you trip. What can I do with my stairs?  Do not leave any items on the stairs.  Make sure that there are handrails on both sides of the stairs and use them. Fix handrails that are broken or loose. Make sure that handrails are as long as the stairways.  Check any carpeting to make sure that it is firmly attached to the stairs. Fix any carpet that is loose or worn.  Avoid having throw rugs at the top or bottom of the stairs. If you do have throw rugs, attach them to the floor with carpet tape.  Make sure that you have a light switch at  the top of the stairs and the bottom of the stairs. If you do not have them, ask someone to add them for you. What else can I do to help prevent falls?  Wear shoes that:  Do not have high heels.  Have rubber bottoms.  Are comfortable and fit you well.  Are closed at the toe. Do not wear sandals.  If you use a stepladder:  Make sure that it is fully opened. Do not climb a closed stepladder.  Make sure that both sides of the stepladder are locked into place.  Ask someone to hold it for you, if possible.  Clearly mark and make sure that you can see:  Any grab bars or handrails.  First and last steps.  Where the edge of each step is.  Use tools that help you move around (mobility aids) if they are needed. These include:  Canes.  Walkers.  Scooters.  Crutches.  Turn on the lights when you go into a dark area. Replace any light bulbs as soon as they burn out.  Set up your furniture so you have a clear path. Avoid moving your furniture around.  If any of your floors are uneven, fix them.  If there are any pets around you, be aware of where they are.  Review your medicines with your doctor. Some medicines can make you feel dizzy. This can increase your chance of falling. Ask your doctor what other things that you can do to help prevent falls. This information is not intended to replace advice given to you by your health care provider. Make sure you discuss any questions you have with your health care provider. Document Released: 05/17/2009 Document Revised: 12/27/2015 Document Reviewed: 08/25/2014 Elsevier Interactive Patient Education  2017 [employer].

## 2018-11-09 NOTE — Progress Notes (Signed)
Subjective:   Miranda [name] is a 53 y.o. female who presents for Medicare Annual (Subsequent) preventive examination.  Review of Systems:  Cardiac Risk Factors include: family history of premature cardiovascular disease;hypertension;smoking/ tobacco exposure;sedentary lifestyle     Objective:     Vitals: LMP 01/17/2015   There is no height or weight on file to calculate BMI.  Advanced Directives 09/08/2018 01/11/2018 11/06/2017 09/14/2017 03/26/2017 03/12/2017 03/10/2017  Does Patient Have a Medical Advance Directive? No No No No No No No  Does patient want to make changes to medical advance directive? - - - - - - -  Would patient like information on creating a medical advance directive? No - Patient declined No - Patient declined Yes (MAU/Ambulatory/Procedural Areas - Information given) No - Patient declined - No - Patient declined No - Patient declined  Pre-existing out of facility DNR order (yellow form or pink MOST form) - - - - - - -    Tobacco Social History   Tobacco Use  Smoking Status Former Smoker  . Packs/day: 0.50  . Years: 5.00  . Pack years: 2.50  . Types: Cigars, Cigarettes  . Last attempt to quit: 08/04/1978  . Years since quitting: 40.2  Smokeless Tobacco Never Used  Tobacco Comment   1 cigar daily-quit 10/24/12       Counseling given: Not Answered Comment: 1 cigar daily-quit 10/24/12     Clinical Intake:  Pre-visit preparation completed: No  Pain : No/denies pain     BMI - recorded: 23.96 Nutritional Status: BMI of 19-24  Normal Nutritional Risks: None Diabetes: No  How often do you need to have someone help you when you read instructions, pamphlets, or other written materials from your doctor or pharmacy?: 1 - Never What is the last grade level you completed in school?: 12 grade        Past Medical History:  Diagnosis Date  . Anemia   . Anxiety   . At risk for sudden cardiac death Nov 14, 2012  . CHF - Combined Systolic + Diastolic Dysfunction. EF  of 15-20% with Grade II diastolic dysfunction on echo 10/27/12 10/28/2012  . Coronary artery disease   . Depression   . Dyspnea   . Fibroids   . Heart murmur   . Hypertension   . Hypothyroidism   . S/P cardiac catheterization, 11-14-12, normal coronaries with minimal luminal irregularities in RCA system 10/30/2012  . Thyroid disease    hypothyroidism   Past Surgical History:  Procedure Laterality Date  . AORTIC VALVE REPLACEMENT N/A 03/12/2017   Procedure: AORTIC VALVE REPLACEMENT (AVR) with Mitral Annuloplasty;  Surgeon: Gaye Pollack, MD;  Location: [facility] OR;  Service: Open Heart Surgery;  Laterality: N/A;  . LEFT AND RIGHT HEART CATHETERIZATION WITH CORONARY ANGIOGRAM N/A 2012-11-14   Procedure: LEFT AND RIGHT HEART CATHETERIZATION WITH CORONARY ANGIOGRAM;  Surgeon: Leonie Man, MD;  Location: [facility] CATH LAB;  Service: Cardiovascular;  Laterality: N/A;  . MITRAL VALVE REPAIR N/A 03/12/2017   Procedure: MITRAL VALVE REPAIR (MVR);  Surgeon: Gaye Pollack, MD;  Location: [place];  Service: Open Heart Surgery;  Laterality: N/A;  mitral annuloplasty  . MULTIPLE EXTRACTIONS WITH ALVEOLOPLASTY N/A 01/21/2017   Procedure: Extraction of tooth #'s 4-13 with alveoloplasty;  Surgeon: Lenn Cal, DDS;  Location: [place];  Service: Oral Surgery;  Laterality: N/A;  . RIGHT/LEFT HEART CATH AND CORONARY ANGIOGRAPHY N/A 01/09/2017   Procedure: Right/Left Heart Cath and Coronary Angiography;  Surgeon: Troy Sine, MD;  Location: [place] CV LAB;  Service: Cardiovascular;  Laterality: N/A;  . TEE WITHOUT CARDIOVERSION N/A 03/12/2017   Procedure: TRANSESOPHAGEAL ECHOCARDIOGRAM (TEE);  Surgeon: Gaye Pollack, MD;  Location: [place];  Service: Open Heart Surgery;  Laterality: N/A;   Family History  Problem Relation Age of Onset  . Bone cancer Paternal Grandfather   . Hypertension Mother   . Liver disease Mother        autoimmune  . Heart disease Mother   . Colon polyps Mother   . Stroke Father   .  Hypertension Father   . Hypertension Maternal Grandmother   . Heart disease Maternal Grandmother   . Breast cancer Cousin   . Bone cancer Maternal Uncle   . Arthritis Paternal Uncle    Social History   Socioeconomic History  . Marital status: Single    Spouse name: Not on file  . Number of children: 1  . Years of education: 12th  . Highest education level: Not on file  Occupational History  . Occupation: unemployed    Employer: [employer]  . Financial resource strain: Not hard at all  . Food insecurity:    Worry: Never true    Inability: Never true  . Transportation needs:    Medical: No    Non-medical: No  Tobacco Use  . Smoking status: Former Smoker    Packs/day: 0.50    Years: 5.00    Pack years: 2.50    Types: Cigars, Cigarettes    Last attempt to quit: 08/04/1978    Years since quitting: 40.2  . Smokeless tobacco: Never Used  . Tobacco comment: 1 cigar daily-quit 10/24/12    Substance and Sexual Activity  . Alcohol use: Yes    Alcohol/week: 3.0 standard drinks    Types: 3 Glasses of wine per week    Comment: per month  . Drug use: Yes    Frequency: 5.0 times per week    Types: Marijuana    Comment: few weeks ago last  . Sexual activity: Not Currently  Lifestyle  . Physical activity:    Days per week: 5 days    Minutes per session: 20 min  . Stress: To some extent  Relationships  . Social connections:    Talks on phone: More than three times a week    Gets together: More than three times a week    Attends religious service: Never    Active member of club or organization: No    Attends meetings of clubs or organizations: Never    Relationship status: Never married  Other Topics Concern  . Not on file  Social History Narrative   Patient lives at home alone. (Condo)   Caffeine use: 1/2 soda daily   Diet   Martial status:single   Is it one or more stories? Yes   How many persons live in your home? 1   Do you have any pets in your  home? No   Current or past profession: Biochemist, clinical   Do you exercise? No   Do you have a living will? No    Do you have a DNR form? No    Do you have signed POA/HPOA forms? No     Outpatient Encounter Medications as of 11/09/2018  Medication Sig  . aspirin EC 81 MG EC tablet Take 1 tablet (81 mg total) by mouth daily.  Marland Kitchen buPROPion (WELLBUTRIN XL) 150 MG 24 hr tablet TAKE 1 TABLET BY MOUTH  ONCE DAILY  . carvedilol (COREG) 6.25 MG tablet TAKE 1 TABLET BY MOUTH TWICE DAILY  . ferrous sulfate 325 (65 FE) MG EC tablet Take 1 tablet (325 mg total) by mouth 2 (two) times daily.  . fluticasone (FLONASE) 50 MCG/ACT nasal spray Place 1 spray into both nostrils daily as needed for allergies or rhinitis.  . furosemide (LASIX) 40 MG tablet TAKE 1 TABLET BY MOUTH ONCE DAILY(EXCEPT ON MON AND THURS TAKE 1&1/2 TABLETS)  . levothyroxine (SYNTHROID, LEVOTHROID) 100 MCG tablet Take 1 tablet (100 mcg total) by mouth daily before breakfast.  . lisinopril (PRINIVIL,ZESTRIL) 10 MG tablet Take 1 tablet (10 mg total) by mouth daily.  . mirtazapine (REMERON) 15 MG tablet TAKE 1 TABLET BY MOUTH AT BEDTIME  . Multiple Vitamins-Minerals (CENTRUM ADULTS PO) Take by mouth daily.  . rosuvastatin (CRESTOR) 10 MG tablet Take 1 tablet (10 mg total) by mouth daily.  Marland Kitchen spironolactone (ALDACTONE) 25 MG tablet TAKE 1 TABLET BY MOUTH ONCE DAILY  . traZODone (DESYREL) 150 MG tablet TAKE 1 TABLET BY MOUTH AT BEDTIME   No facility-administered encounter medications on file as of 11/09/2018.     Activities of Daily Living In your present state of health, do you have any difficulty performing the following activities: 11/09/2018 09/08/2018  Hearing? N N  Vision? N N  Difficulty concentrating or making decisions? Y N  Comment trouble concentrating at times -  Walking or climbing stairs? N N  Dressing or bathing? N N  Doing errands, shopping? N N  Preparing Food and eating ? N N  Using the Toilet? N N  In the past six months,  have you accidently leaked urine? N N  Do you have problems with loss of bowel control? N N  Managing your Medications? N N  Managing your Finances? N N  Housekeeping or managing your Housekeeping? N N  Some recent data might be hidden    Patient Care Team: Gayland Curry, DO as PCP - General (Geriatric Medicine) Jerline Pain, MD as PCP - Cardiology (Cardiology) Emily Filbert, MD as Consulting Physician (Obstetrics and Gynecology) Bensimhon, Shaune Pascal, MD as Consulting Physician (Cardiology) [facility], Earlean Polka, MD as Consulting Physician (Neurology) Dimitri Ped, RN as [place] Management    Assessment:   This is a routine wellness examination for Nashali.  Exercise Activities and Dietary recommendations Current Exercise Habits: Home exercise routine, Type of exercise: walking;calisthenics, Time (Minutes): 30, Frequency (Times/Week): 3, Weekly Exercise (Minutes/Week): 90, Intensity: Moderate  Goals    .  Acknowledge receipt of Advanced Directive package     Client has forms from [place] office Please review forms and contact me at [phone]  if you need help with the forms.  A Education officer, museum can help you if needed    . Client understands the importance of follow-up with providers by attending scheduled visits    . Client will report no fall or injuries in the next 6 months.     Please review the Check for Safety brochure     . Client will report no worsening of symptoms related to heart disease within the next 6 months     . Client will verbalize knowledge of self management of Hypertension as evidences by BP reading of 140/90 or less; or as defined by provider    . maintain lifestyle     Starting 11/03/2016 I would like to maintain my lifestyle.    . Maintain timely refills of Heart  Failure medication as prescribed within the year     . Obtain annual  Lipid Profile, LDL-C    . Record weight daily for the next 6 months (pt-stated)     Please write  your daily weight in the [address] and take log to your doctors appointments    . Visit Primary Care Provider or Cardiologist at least 2 times per year       Fall Risk Fall Risk  11/09/2018 09/27/2018 05/24/2018 01/11/2018 11/06/2017  Falls in the past year? 1 1 Yes No No  Number falls in past yr: 1 0 2 or more - -  Injury with Fall? 0 0 No - -   Is the patient's home free of loose throw rugs in walkways, pet beds, electrical cords, etc?   yes      Grab bars in the bathroom? yes      Handrails on the stairs?   yes      Adequate lighting?   yes  Timed Get Up and Go performed: unable due to televist.   Depression Screen PHQ 2/9 Scores 11/09/2018 09/27/2018 09/08/2018 05/24/2018  PHQ - 2 Score 0 0 1 0     Cognitive Function MMSE - Mini Mental State Exam 11/03/2016  Not completed: Unable to complete     6CIT Screen 11/09/2018  What Year? 0 points  What month? 0 points  What time? 0 points  Count back from 20 0 points  Months in reverse 0 points  Repeat phrase 4 points  Total Score 4    Immunization History  Administered Date(s) Administered  . Hep A / Hep B 09/08/2017, 10/09/2017, 03/11/2018  . Influenza, High Dose Seasonal PF 05/24/2018  . Influenza,inj,Quad PF,6+ Mos 05/26/2016, 05/07/2017  . Pneumococcal Conjugate-13 12/14/2014  . Pneumococcal Polysaccharide-23 01/30/2017  . Tdap 01/25/2016  . Zoster Recombinat (Shingrix) 11/06/2017, 01/28/2018    Qualifies for Shingles Vaccine?yes, done  Screening Tests Health Maintenance  Topic Date Due  . MAMMOGRAM  02/26/2018  . PAP SMEAR-Modifier  01/25/2019  . Fecal DNA (Cologuard)  02/14/2019  . INFLUENZA VACCINE  03/05/2019  . TETANUS/TDAP  01/24/2026  . Hepatitis C Screening  Completed  . HIV Screening  Completed    Cancer Screenings: Lung: Low Dose CT Chest recommended if Age 5-80 years, 30 pack-year currently smoking OR have quit w/in 15years. Patient does not qualify. Breast:  Up to date on  Mammogram? No   Up to date of Bone Density/Dexa? na Colorectal:Due 02/2019  Additional Screenings:  Hepatitis C Screening: completed.      Plan:     I have personally reviewed and noted the following in the patient's chart:   . Medical and social history . Use of alcohol, tobacco or illicit drugs  . Current medications and supplements . Functional ability and status . Nutritional status . Physical activity . Advanced directives . List of other physicians . Hospitalizations, surgeries, and ER visits in previous 12 months . Vitals . Screenings to include cognitive, depression, and falls . Referrals and appointments  In addition, I have reviewed and discussed with patient certain preventive protocols, quality metrics, and best practice recommendations. A written personalized care plan for preventive services as well as general preventive health recommendations were provided to patient.     Lauree Chandler, NP  11/09/2018

## 2018-11-09 NOTE — Progress Notes (Signed)
This service is provided via telemedicine  No vital signs collected/recorded due to the encounter was a telemedicine visit.   Location of patient (ex: home, work): Home   Patient consents to a telephone visit:  Yes  Location of the provider (ex: office, home): [employer], Office   Name of any referring provider:  Gayland Curry, DO  Names of all persons participating in the telemedicine service and their role in the encounter: S.Chrae B/CMA, Sherrie Mustache, NP, and Patient   Time spent on call: 11 min with medical assistant

## 2018-11-28 ENCOUNTER — Encounter: Payer: Self-pay | Admitting: Internal Medicine

## 2018-11-30 ENCOUNTER — Telehealth: Payer: Self-pay | Admitting: *Deleted

## 2018-11-30 NOTE — Telephone Encounter (Signed)
lvm for upcoming appt on May 1 @ 8:45.  Left instructions for appt and if pt cannot keep appt will call office to R/S.  Consent will have to given day of VT appt.

## 2018-12-01 NOTE — Telephone Encounter (Signed)
lvm 2nd attempt with instructions for VT visit on May 1 @ 11:00.  If pt cannot keep appt to call office and R/S.

## 2018-12-02 NOTE — Progress Notes (Signed)
Telehealth Visit     Virtual Visit via Telephone Note   This visit type was conducted due to national recommendations for restrictions regarding the COVID-19 Pandemic (e.g. social distancing) in an effort to limit this patient's exposure and mitigate transmission in our community.  Due to her co-morbid illnesses, this patient is at least at moderate risk for complications without adequate follow up.  This format is felt to be most appropriate for this patient at this time.  The patient did not have access to video technology/had technical difficulties with video requiring transitioning to audio format only (telephone).  All issues noted in this document were discussed and addressed.  No physical exam could be performed with this format.  Please refer to the patient's chart for her  consent to telehealth for [facility].   Evaluation Performed:  Follow-up visit  This visit type was conducted due to national recommendations for restrictions regarding the COVID-19 Pandemic (e.g. social distancing).  This format is felt to be most appropriate for this patient at this time.  All issues noted in this document were discussed and addressed.  No physical exam was performed (except for noted visual exam findings with Video Visits).  Please refer to the patient's chart (MyChart message for video visits and phone note for telephone visits) for the patient's consent to telehealth for [facility].  Date:  12/03/2018   ID:  Miranda [name], DOB 11-25-1965, MRN [id number]  Patient Location:  Home  Provider location:   Home  PCP:  Gayland Curry, DO  Cardiologist:  Servando Snare Candee Furbish, MD  Electrophysiologist:  None   Chief Complaint:  Follow up visit.   History of Present Illness:    Miranda [name] is a 53 y.o. female who presents via audio/video conferencing for a telehealth visit today.  Seen for Dr. Marlou Porch.   She has a history of iron deficiency anemia, mild nonobstructive CAD,chronic  systolic HF, NICM, with previous EFat15%. Her EKG has been chronically abnormal with T wave inversion in several leads.   Seen foracute CHFback in the summer of 2018 -echo with severeaortic regurgitation andEF back down to 30-35%. Cath with Ost RPDA to RPDA lesion 50% stenosed, severe LV dysfunction mod pulmonary hypertension, and mild 2+MR. EF was 15%. PA pk pressure was 60 mmHg. Severe aortic insuff. Mildly dilated aortic root. Mild coronary obstructive disease with a normal LAD, normal small intermediate vessel, normal left circumflex, and a large dominant RCA with smooth 50% ostial PDA narrowing.)Multiple teeth extractions. Then underwent AVR with Dr. Cyndia Bent on 03/12/2017 with23 mm Edwards Magna-Ease pericardial valve and MV annuloplasty with 28 mm Sorin 3D MEMO ring.She was treated with coumadin for 3 months and then switched over to aspirin.   I have seen her several times since along with Dr. Marlou Porch - she has had elevation of her LFTs - statin was stopped for a period of time. Echo was updated - EF remained down initially. Plan was to try and maximize her medicines.  She has been switched over to [facility] - limited echo showed that herEF had improved and was back up to 50 to 55%.  When I saw her back for follow up unfortunately, her Delene Loll had been denied and we had to switch back to ACE due to excessive cost. Last seen by me in June of 2019 and she was doing well. She had seen Gi who was not able to define why she had had elevated LFTs.  Last seen by Dr. Marlou Porch in September  of 2019.   The patient does not have symptoms concerning for COVID-19 infection (fever, chills, cough, or new shortness of breath).   Seen today via telephone visit. She declined the video portion and preferred phone conversation only. She has declined My Chart in the past. She has consented for this visit. She feels like she is doing ok. Not short of breath. No chest pain. One rare episode of being  lightheaded - this has not recurred. No syncope. She has lost weight. Taking her statin "sporadically" but lipids are improved.  I see that she had her lipids checked in February from the [facility]. She has not had LFTs however in almost one year. Overall, she feels like she is ok and has no real concerns.   Past Medical History:  Diagnosis Date  . Anemia   . Anxiety   . At risk for sudden cardiac death Nov 04, 2012  . CHF - Combined Systolic + Diastolic Dysfunction. EF of 15-20% with Grade II diastolic dysfunction on echo 10/27/12 10/28/2012  . Coronary artery disease   . Depression   . Dyspnea   . Fibroids   . Heart murmur   . Hypertension   . Hypothyroidism   . S/P cardiac catheterization, Nov 04, 2012, normal coronaries with minimal luminal irregularities in RCA system 10/30/2012  . Thyroid disease    hypothyroidism   Past Surgical History:  Procedure Laterality Date  . AORTIC VALVE REPLACEMENT N/A 03/12/2017   Procedure: AORTIC VALVE REPLACEMENT (AVR) with Mitral Annuloplasty;  Surgeon: Gaye Pollack, MD;  Location: [facility] OR;  Service: Open Heart Surgery;  Laterality: N/A;  . LEFT AND RIGHT HEART CATHETERIZATION WITH CORONARY ANGIOGRAM N/A November 04, 2012   Procedure: LEFT AND RIGHT HEART CATHETERIZATION WITH CORONARY ANGIOGRAM;  Surgeon: Leonie Man, MD;  Location: [facility] CATH LAB;  Service: Cardiovascular;  Laterality: N/A;  . MITRAL VALVE REPAIR N/A 03/12/2017   Procedure: MITRAL VALVE REPAIR (MVR);  Surgeon: Gaye Pollack, MD;  Location: [place];  Service: Open Heart Surgery;  Laterality: N/A;  mitral annuloplasty  . MULTIPLE EXTRACTIONS WITH ALVEOLOPLASTY N/A 01/21/2017   Procedure: Extraction of tooth #'s 4-13 with alveoloplasty;  Surgeon: Lenn Cal, DDS;  Location: [place];  Service: Oral Surgery;  Laterality: N/A;  . RIGHT/LEFT HEART CATH AND CORONARY ANGIOGRAPHY N/A 01/09/2017   Procedure: Right/Left Heart Cath and Coronary Angiography;  Surgeon: Troy Sine, MD;  Location: [place] CV LAB;  Service: Cardiovascular;  Laterality: N/A;  . TEE WITHOUT CARDIOVERSION N/A 03/12/2017   Procedure: TRANSESOPHAGEAL ECHOCARDIOGRAM (TEE);  Surgeon: Gaye Pollack, MD;  Location: [place];  Service: Open Heart Surgery;  Laterality: N/A;     Current Meds  Medication Sig  . aspirin EC 81 MG EC tablet Take 1 tablet (81 mg total) by mouth daily.  Marland Kitchen buPROPion (WELLBUTRIN XL) 150 MG 24 hr tablet TAKE 1 TABLET BY MOUTH ONCE DAILY  . carvedilol (COREG) 6.25 MG tablet TAKE 1 TABLET BY MOUTH TWICE DAILY  . ferrous sulfate 325 (65 FE) MG EC tablet Take 1 tablet (325 mg total) by mouth 2 (two) times daily.  . fluticasone (FLONASE) 50 MCG/ACT nasal spray Place 1 spray into both nostrils daily as needed for allergies or rhinitis.  . furosemide (LASIX) 40 MG tablet TAKE 1 TABLET BY MOUTH ONCE DAILY(EXCEPT ON MON AND THURS TAKE 1&1/2 TABLETS)  . levothyroxine (SYNTHROID, LEVOTHROID) 100 MCG tablet Take 1 tablet (100 mcg total) by mouth daily before breakfast.  . lisinopril (PRINIVIL,ZESTRIL) 10  MG tablet Take 1 tablet (10 mg total) by mouth daily.  . mirtazapine (REMERON) 15 MG tablet TAKE 1 TABLET BY MOUTH AT BEDTIME  . Multiple Vitamins-Minerals (CENTRUM ADULTS PO) Take by mouth daily.  . rosuvastatin (CRESTOR) 10 MG tablet Take 1 tablet (10 mg total) by mouth daily.  Marland Kitchen spironolactone (ALDACTONE) 25 MG tablet TAKE 1 TABLET BY MOUTH ONCE DAILY  . traZODone (DESYREL) 150 MG tablet TAKE 1 TABLET BY MOUTH AT BEDTIME     Allergies:   Codeine   Social History   Tobacco Use  . Smoking status: Former Smoker    Packs/day: 0.50    Years: 5.00    Pack years: 2.50    Types: Cigars, Cigarettes    Last attempt to quit: 08/04/1978    Years since quitting: 40.3  . Smokeless tobacco: Never Used  . Tobacco comment: 1 cigar daily-quit 10/24/12    Substance Use Topics  . Alcohol use: Yes    Alcohol/week: 3.0 standard drinks    Types: 3 Glasses of wine per week    Comment: per month  . Drug use: Yes     Frequency: 5.0 times per week    Types: Marijuana    Comment: few weeks ago last     Family Hx: The patient's family history includes Arthritis in her paternal uncle; Bone cancer in her maternal uncle and paternal grandfather; Breast cancer in her cousin; Colon polyps in her mother; Heart disease in her maternal grandmother and mother; Hypertension in her father, maternal grandmother, and mother; Liver disease in her mother; Stroke in her father.  ROS:   Please see the history of present illness.   All other systems reviewed are negative.    Objective:    Vital Signs:  BP 124/85   Pulse 62   Ht 5' 4.5" (1.638 m)   Wt 140 lb (63.5 kg)   LMP 01/17/2015   BMI 23.66 kg/m    Wt Readings from Last 3 Encounters:  12/03/18 140 lb (63.5 kg)  09/27/18 144 lb (65.3 kg)  05/24/18 147 lb (66.7 kg)    Alert female in no acute distress. Does not sound short of breath with conversation. Her weight is down.    Labs/Other Tests and Data Reviewed:    Lab Results  Component Value Date   WBC 4.8 09/27/2018   HGB 12.8 09/27/2018   HCT 39.3 09/27/2018   PLT 183 09/27/2018   GLUCOSE 81 01/07/2018   CHOL 192 09/27/2018   TRIG 86 09/27/2018   HDL 62 09/27/2018   LDLCALC 111 (H) 09/27/2018   ALT 13 01/07/2018   ALT 13 01/07/2018   AST 25 01/07/2018   AST 25 01/07/2018   NA 137 01/07/2018   K 5.0 01/07/2018   CL 103 01/07/2018   CREATININE 1.51 (H) 01/07/2018   BUN 32 (H) 01/07/2018   CO2 29 01/07/2018   TSH 1.82 09/27/2018   INR 0.95 09/14/2017   HGBA1C 6.1 (H) 03/10/2017     BNP (last 3 results) No results for input(s): BNP in the last 8760 hours.  ProBNP (last 3 results) No results for input(s): PROBNP in the last 8760 hours.    Prior CV studies:    The following studies were reviewed today:  EchoStudy Conclusions3/2019  - Left ventricle: The cavity size was mildly dilated. Systolic function was normal. The estimated ejection fraction was in the range of  50% to 55%. Features are consistent with a pseudonormal left ventricular filling pattern, with  concomitant abnormal relaxation and increased filling pressure (grade 2 diastolic dysfunction). - Aortic valve: A bioprosthesis was present. Valve area (VTI): 1.84 cm^2. Valve area (Vmax): 1.85 cm^2. Valve area (Vmean): 1.83 cm^2. - Mitral valve: Prior procedures included surgical repair. There was mild regurgitation. Valve area by pressure half-time: 2.14 cm^2. Valve area by continuity equation (using LVOT flow): 1.81 cm^2. - Left atrium: The atrium was mildly dilated.  Notes recorded by Jerline Pain, MD on 10/21/2017 at 10:19 AM EDT Improved EF on Entresto. Continue. Bioprosthetic aortic valve and mitral valve repair is intact.  Candee Furbish, MD      EchoStudy ConclusionsNovember 2018  - Left ventricle: The cavity size was normal. Wall thickness was normal. Systolic function was moderately to severely reduced. The estimated ejection fraction was in the range of 30% to 35%. Moderate diffuse hypokinesis with distinct regional wall motion abnormalities. Hypokinesis of the inferior myocardium. The study is not technically sufficient to allow evaluation of LV diastolic function. Doppler parameters are consistent with high ventricular filling pressure. - Ventricular septum: Septal motion showed paradox. - Aortic valve: A bioprosthesis was present and functioning normally. Mean gradient (S): 11 mm Hg. Valve area (VTI): 2.08 cm^2. - Mitral valve: S/P MV repair with annuloplasty ring. Moderate diffuse thickening. There was trivial regurgitation directed centrally. - Left atrium: The atrium was moderately to severely dilated. - Right atrium: The atrium was mildly dilated. - Pulmonic valve: There was trivial regurgitation.  Impressions:  - Compared to prior study, there is now a normally functioning AV bioprosthesis and MV repair with  trivial central MR.   ASSESSMENT & PLAN:    1. Chronic systolic HF - EF was 30 to 35% - this has improved per recent echo from 10/2017 with EF now up to 50 to 55% - she has not tolerated higher doses of Coreg due to dizziness.   She was not able to afford Entresto and now is back on ACE. She is doing well clinically. No changes made today.    2. Prior AVR, MV repair - reminded of SBE - her last echo is from 10/2017 - will need to consider updating when seen back in the office.   3. History of Elevated LFTs - unclear etiology. She is back on low dose statin therapy - needs LFTs - will check in one month.   4.CAD non obstructive-she is manage medically. No chest pain noted.   5. HLD- she is back on low dose statin - taking intermittently. Needs LFTs - will check in one month.   6. Iron deficiencyanemia-followed by PCP - not discussed today.   7. COVID-19 Education: The signs and symptoms of COVID-19 were discussed with the patient and how to seek care for testing (follow up with PCP or arrange E-visit).  The importance of social distancing, staying at home, hand hygiene and wearing a mask when out in public were discussed today.  Patient Risk:   After full review of this patient's clinical status, I feel that they are at least moderate risk at this time.  Time:   Today, I have spent 8 minutes with the patient with telehealth technology discussing the above issues.     Medication Adjustments/Labs and Tests Ordered: Current medicines are reviewed at length with the patient today.  Concerns regarding medicines are outlined above.   Tests Ordered: No orders of the defined types were placed in this encounter.   Medication Changes: No orders of the defined types were placed in this encounter.  Disposition:  FU with me in 4 months. Will check lab in one month.     Patient is agreeable to this plan and will call if any problems develop in the interim.    Amie Critchley, NP  12/03/2018 8:45 AM    [place]

## 2018-12-03 ENCOUNTER — Other Ambulatory Visit: Payer: Self-pay

## 2018-12-03 ENCOUNTER — Encounter: Payer: Self-pay | Admitting: Nurse Practitioner

## 2018-12-03 ENCOUNTER — Telehealth (INDEPENDENT_AMBULATORY_CARE_PROVIDER_SITE_OTHER): Payer: HMO | Admitting: Nurse Practitioner

## 2018-12-03 ENCOUNTER — Telehealth: Payer: Self-pay | Admitting: *Deleted

## 2018-12-03 VITALS — BP 124/85 | HR 62 | Ht 64.5 in | Wt 140.0 lb

## 2018-12-03 DIAGNOSIS — I428 Other cardiomyopathies: Secondary | ICD-10-CM

## 2018-12-03 DIAGNOSIS — I5042 Chronic combined systolic (congestive) and diastolic (congestive) heart failure: Secondary | ICD-10-CM

## 2018-12-03 DIAGNOSIS — Z7189 Other specified counseling: Secondary | ICD-10-CM

## 2018-12-03 DIAGNOSIS — E7849 Other hyperlipidemia: Secondary | ICD-10-CM

## 2018-12-03 DIAGNOSIS — R945 Abnormal results of liver function studies: Secondary | ICD-10-CM

## 2018-12-03 DIAGNOSIS — Z9889 Other specified postprocedural states: Secondary | ICD-10-CM

## 2018-12-03 DIAGNOSIS — R7989 Other specified abnormal findings of blood chemistry: Secondary | ICD-10-CM

## 2018-12-03 DIAGNOSIS — Z7901 Long term (current) use of anticoagulants: Secondary | ICD-10-CM

## 2018-12-03 DIAGNOSIS — Z952 Presence of prosthetic heart valve: Secondary | ICD-10-CM

## 2018-12-03 NOTE — Telephone Encounter (Signed)

## 2018-12-03 NOTE — Patient Instructions (Addendum)
After Visit Summary:  We will be checking the following labs today - NONE  Needs BMET and LFTs in one month in the office.    Medication Instructions:    Continue with your current medicines.    If you need a refill on your cardiac medications before your next appointment, please call your pharmacy.     Testing/Procedures To Be Arranged:  N/A  Follow-Up:   See me in about 4 months.     At [facility], you and your health needs are our priority.  As part of our continuing mission to provide you with exceptional heart care, we have created designated Provider Care Teams.  These Care Teams include your primary Cardiologist (physician) and Advanced Practice Providers (APPs -  Physician Assistants and Nurse Practitioners) who all work together to provide you with the care you need, when you need it.  Special Instructions:  . Stay safe, stay home, wash your hands for at least 20 seconds and wear a mask when out in public.  . It was good to talk with you today.    Call the [place] office at [phone] if you have any questions, problems or concerns.

## 2019-01-03 ENCOUNTER — Other Ambulatory Visit: Payer: HMO | Admitting: *Deleted

## 2019-01-03 ENCOUNTER — Other Ambulatory Visit: Payer: Self-pay

## 2019-01-03 DIAGNOSIS — E7849 Other hyperlipidemia: Secondary | ICD-10-CM

## 2019-01-03 DIAGNOSIS — I5042 Chronic combined systolic (congestive) and diastolic (congestive) heart failure: Secondary | ICD-10-CM

## 2019-01-03 DIAGNOSIS — R7989 Other specified abnormal findings of blood chemistry: Secondary | ICD-10-CM

## 2019-01-03 LAB — BASIC METABOLIC PANEL
BUN/Creatinine Ratio: 21 (ref 9–23)
BUN: 29 mg/dL — ABNORMAL HIGH (ref 6–24)
CO2: 23 mmol/L (ref 20–29)
Calcium: 10.1 mg/dL (ref 8.7–10.2)
Chloride: 99 mmol/L (ref 96–106)
Creatinine, Ser: 1.4 mg/dL — ABNORMAL HIGH (ref 0.57–1.00)
GFR calc Af Amer: 49 mL/min/{1.73_m2} — ABNORMAL LOW (ref >59)
GFR calc non Af Amer: 42 mL/min/{1.73_m2} — ABNORMAL LOW (ref >59)
Glucose: 84 mg/dL (ref 65–99)
Potassium: 4.6 mmol/L (ref 3.5–5.2)
Sodium: 139 mmol/L (ref 134–144)

## 2019-01-03 LAB — HEPATIC FUNCTION PANEL
ALT: 11 IU/L (ref 0–32)
AST: 26 IU/L (ref 0–40)
Albumin: 4.6 g/dL (ref 3.8–4.9)
Alkaline Phosphatase: 62 IU/L (ref 39–117)
Bilirubin Total: 0.2 mg/dL (ref 0.0–1.2)
Bilirubin, Direct: 0.08 mg/dL (ref 0.00–0.40)
Total Protein: 8.3 g/dL (ref 6.0–8.5)

## 2019-01-10 ENCOUNTER — Other Ambulatory Visit: Payer: Self-pay | Admitting: Internal Medicine

## 2019-02-07 ENCOUNTER — Encounter: Payer: Self-pay | Admitting: Internal Medicine

## 2019-02-07 ENCOUNTER — Ambulatory Visit (INDEPENDENT_AMBULATORY_CARE_PROVIDER_SITE_OTHER): Payer: HMO | Admitting: Internal Medicine

## 2019-02-07 ENCOUNTER — Other Ambulatory Visit: Payer: Self-pay

## 2019-02-07 VITALS — BP 120/80 | HR 62 | Temp 97.6°F | Ht 65.0 in | Wt 138.0 lb

## 2019-02-07 DIAGNOSIS — F3341 Major depressive disorder, recurrent, in partial remission: Secondary | ICD-10-CM

## 2019-02-07 DIAGNOSIS — E782 Mixed hyperlipidemia: Secondary | ICD-10-CM | POA: Diagnosis not present

## 2019-02-07 DIAGNOSIS — I5022 Chronic systolic (congestive) heart failure: Secondary | ICD-10-CM

## 2019-02-07 DIAGNOSIS — E039 Hypothyroidism, unspecified: Secondary | ICD-10-CM

## 2019-02-07 DIAGNOSIS — I428 Other cardiomyopathies: Secondary | ICD-10-CM | POA: Diagnosis not present

## 2019-02-07 DIAGNOSIS — M1712 Unilateral primary osteoarthritis, left knee: Secondary | ICD-10-CM

## 2019-02-07 DIAGNOSIS — F5104 Psychophysiologic insomnia: Secondary | ICD-10-CM | POA: Diagnosis not present

## 2019-02-07 DIAGNOSIS — I952 Hypotension due to drugs: Secondary | ICD-10-CM

## 2019-02-07 DIAGNOSIS — D509 Iron deficiency anemia, unspecified: Secondary | ICD-10-CM | POA: Diagnosis not present

## 2019-02-07 DIAGNOSIS — I5042 Chronic combined systolic (congestive) and diastolic (congestive) heart failure: Secondary | ICD-10-CM

## 2019-02-07 MED ORDER — MIRTAZAPINE 15 MG PO TABS: 15.0000 mg | ORAL_TABLET | Freq: Every day | ORAL | 3 refills | Status: DC

## 2019-02-07 MED ORDER — LISINOPRIL 10 MG PO TABS: 10.0000 mg | ORAL_TABLET | Freq: Every day | ORAL | 3 refills | Status: DC

## 2019-02-07 MED ORDER — LEVOTHYROXINE SODIUM 100 MCG PO TABS: 100.0000 ug | ORAL_TABLET | Freq: Every day | ORAL | 3 refills | Status: DC

## 2019-02-07 MED ORDER — TRAZODONE HCL 150 MG PO TABS: 150.0000 mg | ORAL_TABLET | Freq: Every day | ORAL | 3 refills | Status: DC

## 2019-02-07 MED ORDER — CARVEDILOL 6.25 MG PO TABS: 6.2500 mg | ORAL_TABLET | Freq: Two times a day (BID) | ORAL | 3 refills | Status: DC

## 2019-02-07 MED ORDER — DICLOFENAC SODIUM 1 % TD GEL: 4.0000 g | Freq: Four times a day (QID) | TRANSDERMAL | 3 refills | Status: DC

## 2019-02-07 MED ORDER — BUPROPION HCL ER (XL) 150 MG PO TB24: 150.0000 mg | ORAL_TABLET | Freq: Every day | ORAL | 3 refills | Status: DC

## 2019-02-07 MED ORDER — LISINOPRIL 5 MG PO TABS: 5.0000 mg | ORAL_TABLET | Freq: Every day | ORAL | 3 refills | Status: DC

## 2019-02-07 MED ORDER — SPIRONOLACTONE 25 MG PO TABS: 25.0000 mg | ORAL_TABLET | Freq: Every day | ORAL | 3 refills | Status: DC

## 2019-02-07 NOTE — Patient Instructions (Addendum)
Cut your lisinopril 10mg  tablets in half until you get low, then let us know and we'll send in the 5mg  tablets.    Try voltaren gel on your left knee when needed for knee pain.

## 2019-02-07 NOTE — Progress Notes (Signed)
Location:  [facility] clinic Provider:  Reedy Biernat L. Mariea Clonts, D.O., C.M.D.  Goals of Care:  Advanced Directives 09/08/2018  Does Patient Have a Medical Advance Directive? No  Does patient want to make changes to medical advance directive? -  Would patient like information on creating a medical advance directive? No - Patient declined  Pre-existing out of facility DNR order (yellow form or pink MOST form) -     Chief Complaint  Patient presents with  . Medical Management of Chronic Issues    5mth follow-up    HPI: Patient is a 53 y.o. female seen today for medical management of chronic diseases.    No chest pain or shortness of breath.  She has had dizziness.  BP is running a little low--80s-90s-low 100s.  She actually skips her lisinopril sometimes due to low bp. She will bend over and have to sit down.  She does pretty well about hydrating.  She drinks flavored water.    Most of the time, she forgets her crestor.  Advised she may take it with other meds if that helps her to remember.    Sleeping well with her trazodone and remeron.    Mood has also been ok.  She's been socially isolating so no one bothered her.  No hallucination issues.  Does well if she has her wellbutrin.  She's learned that keeps them away.      Left knee is really hurting her bad.  One time bothered her so bad, she could not go up or down.  Took someone's medication and it helped.  She's not sure what it was.  She has tried aleve for it at home--sometimes helps, but did not that time.  Will take tylenol some also, but only needs like once a week.   Icy hot only works for a minute, then it's gone.    Waiting until after covid to get mammogram.  Agrees to come for cpe with pap here.  Past Medical History:  Diagnosis Date  . Anemia   . Anxiety   . At risk for sudden cardiac death 11/28/2012  . CHF - Combined Systolic + Diastolic Dysfunction. EF of 15-20% with Grade II diastolic dysfunction on echo 10/27/12 10/28/2012  .  Coronary artery disease   . Depression   . Dyspnea   . Fibroids   . Heart murmur   . Hypertension   . Hypothyroidism   . S/P cardiac catheterization, 11/28/12, normal coronaries with minimal luminal irregularities in RCA system 10/30/2012  . Thyroid disease    hypothyroidism    Past Surgical History:  Procedure Laterality Date  . AORTIC VALVE REPLACEMENT N/A 03/12/2017   Procedure: AORTIC VALVE REPLACEMENT (AVR) with Mitral Annuloplasty;  Surgeon: Gaye Pollack, MD;  Location: [facility] OR;  Service: Open Heart Surgery;  Laterality: N/A;  . LEFT AND RIGHT HEART CATHETERIZATION WITH CORONARY ANGIOGRAM N/A 2012/11/28   Procedure: LEFT AND RIGHT HEART CATHETERIZATION WITH CORONARY ANGIOGRAM;  Surgeon: Leonie Man, MD;  Location: [facility] CATH LAB;  Service: Cardiovascular;  Laterality: N/A;  . MITRAL VALVE REPAIR N/A 03/12/2017   Procedure: MITRAL VALVE REPAIR (MVR);  Surgeon: Gaye Pollack, MD;  Location: [place];  Service: Open Heart Surgery;  Laterality: N/A;  mitral annuloplasty  . MULTIPLE EXTRACTIONS WITH ALVEOLOPLASTY N/A 01/21/2017   Procedure: Extraction of tooth #'s 4-13 with alveoloplasty;  Surgeon: Lenn Cal, DDS;  Location: [place];  Service: Oral Surgery;  Laterality: N/A;  . RIGHT/LEFT HEART CATH AND CORONARY  ANGIOGRAPHY N/A 01/09/2017   Procedure: Right/Left Heart Cath and Coronary Angiography;  Surgeon: Troy Sine, MD;  Location: [place] CV LAB;  Service: Cardiovascular;  Laterality: N/A;  . TEE WITHOUT CARDIOVERSION N/A 03/12/2017   Procedure: TRANSESOPHAGEAL ECHOCARDIOGRAM (TEE);  Surgeon: Gaye Pollack, MD;  Location: [place];  Service: Open Heart Surgery;  Laterality: N/A;    Allergies  Allergen Reactions  . Codeine Hives    Outpatient Encounter Medications as of 02/07/2019  Medication Sig  . aspirin EC 81 MG EC tablet Take 1 tablet (81 mg total) by mouth daily.  Marland Kitchen buPROPion (WELLBUTRIN XL) 150 MG 24 hr tablet TAKE 1 TABLET BY MOUTH ONCE DAILY  . carvedilol (COREG)  6.25 MG tablet TAKE 1 TABLET BY MOUTH TWICE DAILY  . ferrous sulfate 325 (65 FE) MG EC tablet Take 1 tablet (325 mg total) by mouth 2 (two) times daily.  . fluticasone (FLONASE) 50 MCG/ACT nasal spray Place 1 spray into both nostrils daily as needed for allergies or rhinitis.  . furosemide (LASIX) 40 MG tablet TAKE 1 TABLET BY MOUTH ONCE DAILY(EXCEPT ON MON AND THURS TAKE 1&1/2 TABLETS)  . levothyroxine (SYNTHROID, LEVOTHROID) 100 MCG tablet Take 1 tablet (100 mcg total) by mouth daily before breakfast.  . lisinopril (PRINIVIL,ZESTRIL) 10 MG tablet Take 1 tablet (10 mg total) by mouth daily.  . mirtazapine (REMERON) 15 MG tablet TAKE 1 TABLET BY MOUTH AT BEDTIME  . Multiple Vitamins-Minerals (CENTRUM ADULTS PO) Take by mouth daily.  . rosuvastatin (CRESTOR) 10 MG tablet Take 1 tablet (10 mg total) by mouth daily.  Marland Kitchen spironolactone (ALDACTONE) 25 MG tablet TAKE 1 TABLET BY MOUTH ONCE DAILY  . traZODone (DESYREL) 150 MG tablet TAKE 1 TABLET BY MOUTH AT BEDTIME   No facility-administered encounter medications on file as of 02/07/2019.     Review of Systems:  Review of Systems  Constitutional: Negative for chills, fever and malaise/fatigue.  HENT: Negative for congestion and hearing loss.   Eyes: Positive for double vision. Negative for blurred vision.  Respiratory: Negative for cough and shortness of breath.   Cardiovascular: Negative for chest pain, palpitations and leg swelling.  Gastrointestinal: Negative for abdominal pain, blood in stool, constipation, diarrhea and melena.  Genitourinary: Negative for dysuria.  Musculoskeletal: Positive for joint pain. Negative for falls.       Left knee  Skin: Negative for itching and rash.  Neurological: Negative for dizziness and loss of consciousness.  Endo/Heme/Allergies: Does not bruise/bleed easily.  Psychiatric/Behavioral: Positive for hallucinations. Negative for depression and memory loss. The patient is not nervous/anxious and does not have  insomnia.        All controlled at present    Health Maintenance  Topic Date Due  . MAMMOGRAM  02/26/2018  . PAP SMEAR-Modifier  01/25/2019  . Fecal DNA (Cologuard)  02/14/2019  . INFLUENZA VACCINE  03/05/2019  . TETANUS/TDAP  01/24/2026  . Hepatitis C Screening  Completed  . HIV Screening  Completed    Physical Exam: Vitals:   02/07/19 0845  BP: 120/80  Pulse: 62  Temp: 97.6 F (36.4 C)  TempSrc: Oral  SpO2: 98%  Weight: 138 lb (62.6 kg)  Height: 5\' 5"  (1.651 m)   Body mass index is 22.96 kg/m. Physical Exam Vitals signs reviewed.  Constitutional:      Appearance: Normal appearance.  HENT:     Head: Normocephalic and atraumatic.  Eyes:     Comments: glasses  Cardiovascular:     Rate and  Rhythm: Normal rate and regular rhythm.     Pulses: Normal pulses.     Heart sounds: Normal heart sounds.  Pulmonary:     Effort: Pulmonary effort is normal.     Breath sounds: Normal breath sounds. No rales.  Abdominal:     General: Bowel sounds are normal.  Musculoskeletal: Normal range of motion.        General: No tenderness.     Comments: Small effusion both medially and laterally left knee  Skin:    General: Skin is warm and dry.  Neurological:     General: No focal deficit present.     Mental Status: She is alert and oriented to person, place, and time.     Labs reviewed: Basic Metabolic Panel: Recent Labs    05/21/18 0948 09/27/18 1010 01/03/19 0835  NA  --   --  139  K  --   --  4.6  CL  --   --  99  CO2  --   --  23  GLUCOSE  --   --  84  BUN  --   --  29*  CREATININE  --   --  1.40*  CALCIUM  --   --  10.1  TSH 0.22* 1.82  --    Liver Function Tests: Recent Labs    01/03/19 0835  AST 26  ALT 11  ALKPHOS 62  BILITOT 0.2  PROT 8.3  ALBUMIN 4.6   No results for input(s): LIPASE, AMYLASE in the last 8760 hours. No results for input(s): AMMONIA in the last 8760 hours. CBC: Recent Labs    09/27/18 1010  WBC 4.8  NEUTROABS 2,731  HGB  12.8  HCT 39.3  MCV 85.6  PLT 183   Lipid Panel: Recent Labs    05/21/18 0948 09/27/18 1010  CHOL 204* 192  HDL 50* 62  LDLCALC 138* 111*  TRIG 67 86  CHOLHDL 4.1 3.1   Lab Results  Component Value Date   HGBA1C 6.1 (H) 03/10/2017    Assessment/Plan 1. Hypotension due to drugs - reduce ace dose, hydrate better - if bp still running low, will get with cardiology about whether dose of another agent can be reduced or moving to stopping ace again  - lisinopril (ZESTRIL) 5 MG tablet; Take 1 tablet (5 mg total) by mouth daily.  Dispense: 90 tablet; Refill: 3 - CBC with Differential/Platelet; Future - COMPLETE METABOLIC PANEL WITH GFR; Future  2. Primary osteoarthritis of left knee - with small effusion--has tried someone's Rx arthritis med (likely celebrex or mobic), but it's not safe for her to be on an oral nsaid regularly--will try topical version prn - diclofenac sodium (VOLTAREN) 1 % GEL; Apply 4 g topically 4 (four) times daily.  Dispense: 150 g; Refill: 3  3. Psychophysiological insomnia -doing well with trazodone and remeron -monitor for serotonin syndrome - mirtazapine (REMERON) 15 MG tablet; Take 1 tablet (15 mg total) by mouth at bedtime.  Dispense: 90 tablet; Refill: 3  4. Chronic combined systolic and diastolic congestive heart failure (HCC) - cont lasix, aldactone, decreased ace to 5mg  from 10mg , and continue to monitor for dizziness -educated on hydration during hot weather--she admits that's always when this happens to her with hypotension - carvedilol (COREG) 6.25 MG tablet; Take 1 tablet (6.25 mg total) by mouth 2 (two) times daily.  Dispense: 180 tablet; Refill: 3  5. Cardiomyopathy, nonischemic (HCC) -cont beta blocker, ace, aldactone  6. Chronic systolic congestive heart failure (  [place]) -has improved gradually with time and followed closely with cardiology -needs ace due to this and her beta blocker, but bp running quite low so will reduce ace dose (she's  been skipping it)  7. Hypothyroidism, unspecified type -cont current levothyroxine therapy (last tsh normal) - TSH; Future  8. Recurrent major depressive disorder, in partial remission ([place]) -continue regular wellbutrin, trazodone and remeron  9. Mixed hyperlipidemia - has improved with diet mostly and some crestor use (but misses more than she takes b/c cannot remember to take in evening--advised to take when she remembers, just not with her levothyroxine) - Hemoglobin A1c; Future - Lipid panel; Future  10. Microcytic anemia - historically related to fibroids, recheck  - CBC with Differential/Platelet; Future  Labs/tests ordered:  Lab Orders     CBC with Differential/Platelet     COMPLETE METABOLIC PANEL WITH GFR     Hemoglobin A1c     Lipid panel     TSH  Next appt:  4 mos for CPE with pap smear, also needs her mammogram but has put off due to covid-19 social isolation at this point  [place]. Chrystal Zeimet, D.O. [facility] [address], [place] [address] Cell Phone (Mon-Fri 8am-5pm):  [phone] On Call:  [phone] & follow prompts after 5pm & weekends Office Phone:  [phone] Office Fax:  [phone]

## 2019-03-09 ENCOUNTER — Other Ambulatory Visit: Payer: Self-pay

## 2019-03-09 NOTE — Patient Outreach (Signed)
  [place] [facility]) Care Management Chronic Special Needs Program  03/09/2019  Name: Miranda [name] DOB: 1965/11/03  MRN: [id number]  [name] is enrolled in a chronic special needs plan for Heart Failure. Client called with no answer No answer and HIPAA compliant message left. Plan for 2nd outreach call in one week Chronic care management coordinator will attempt outreach in one week.   Peter Garter RN, Jackquline Denmark, CDE Chronic Care Management Coordinator [facility] Management [phone]

## 2019-03-15 ENCOUNTER — Other Ambulatory Visit: Payer: Self-pay

## 2019-03-15 NOTE — Patient Outreach (Signed)
  [place] [facility]) Care Management Chronic Special Needs Program  03/15/2019  Name: Rolonda Ebright DOB: 05-22-1966  MRN: [id number]  Ms. Julena Harnack is enrolled in a chronic special needs plan for Heart Failure. Reviewed and updated care plan.  Subjective: Client states that her blood pressure is still running a little lower 90-low 100s after her doctor lowered her blood pressure medication.  States she sometimes gets dizzy when she bends down.  states she weights twice a week and knows to call if her weight goes up more than 3 lbs.  States she did have one fall about one month ago when she was fishing and tripped over a limb.  States she is following a low salt diet  Goals Addressed            This Visit's Progress   .  Acknowledge receipt of Advanced Directive package   Not on track    Client has forms from [place] office Please review forms and contact me at [phone]  if you need help with the forms.  A Education officer, museum can help you if needed    . Client understands the importance of follow-up with providers by attending scheduled visits   On track   . Client will report no fall or injuries in the next 6 months.(continue 03/15/19)   Not on track   . Client will report no worsening of symptoms related to heart disease within the next 6 months (continue 03/15/19)   On track   . Client will verbalize knowledge of self management of Hypertension as evidences by BP reading of 140/90 or less; or as defined by provider   On track   . maintain lifestyle   On track    Starting 11/03/2016 I would like to maintain my lifestyle.    . Maintain timely refills of Heart Failure medication as prescribed within the year    On track   . COMPLETED: Obtain annual  Lipid Profile, LDL-C       Completed 09/27/2018    . Record weight daily for the next 6 months(continue 03/15/19) (pt-stated)   Not on track   . Visit Primary Care Provider or Cardiologist at least 2 times per year   On track      Client again declines referral to social worker for Advanced Directives  Reinforced HF zones and when to call MD Advised to contact Dr. Mariea Clonts if her blood pressure continues to be low and if she feel dizzy Reviewed fall and safety precautions Reviewed number for 24-hour nurse Line Discussed COVID19 cause, symptoms, precautions (social distancing, stay at home order, hand washing), confirmed client knows how to contact provider.  Plan:  Send successful outreach letter with a copy of their individualized care plan and Send individual care plan to provider  Chronic care management coordinator will outreach in:  3-4 Months     Peter Garter RN, [facility], [place] Management Coordinator [place] [facility] [phone]

## 2019-03-17 ENCOUNTER — Ambulatory Visit: Payer: Self-pay

## 2019-03-17 NOTE — Progress Notes (Signed)
Care plan was reviewed.  Agree with it as written.  We've been trying to get her to do her ACP also.  Thank you!

## 2019-04-19 ENCOUNTER — Other Ambulatory Visit: Payer: Self-pay

## 2019-04-19 ENCOUNTER — Ambulatory Visit (INDEPENDENT_AMBULATORY_CARE_PROVIDER_SITE_OTHER): Payer: HMO | Admitting: Cardiology

## 2019-04-19 ENCOUNTER — Encounter (INDEPENDENT_AMBULATORY_CARE_PROVIDER_SITE_OTHER): Payer: Self-pay

## 2019-04-19 ENCOUNTER — Encounter: Payer: Self-pay | Admitting: Cardiology

## 2019-04-19 VITALS — BP 118/70 | HR 67 | Ht 65.0 in | Wt 138.0 lb

## 2019-04-19 DIAGNOSIS — Z9889 Other specified postprocedural states: Secondary | ICD-10-CM

## 2019-04-19 DIAGNOSIS — Z952 Presence of prosthetic heart valve: Secondary | ICD-10-CM | POA: Diagnosis not present

## 2019-04-19 DIAGNOSIS — I950 Idiopathic hypotension: Secondary | ICD-10-CM

## 2019-04-19 DIAGNOSIS — E782 Mixed hyperlipidemia: Secondary | ICD-10-CM

## 2019-04-19 DIAGNOSIS — I952 Hypotension due to drugs: Secondary | ICD-10-CM | POA: Diagnosis not present

## 2019-04-19 DIAGNOSIS — I5042 Chronic combined systolic (congestive) and diastolic (congestive) heart failure: Secondary | ICD-10-CM | POA: Diagnosis not present

## 2019-04-19 MED ORDER — SPIRONOLACTONE 25 MG PO TABS: 12.5000 mg | ORAL_TABLET | Freq: Every day | ORAL | 1 refills | Status: DC

## 2019-04-19 MED ORDER — CARVEDILOL 3.125 MG PO TABS: 3.1250 mg | ORAL_TABLET | Freq: Two times a day (BID) | ORAL | 1 refills | Status: DC

## 2019-04-19 MED ORDER — LISINOPRIL 2.5 MG PO TABS: 2.5000 mg | ORAL_TABLET | Freq: Every day | ORAL | 1 refills | Status: DC

## 2019-04-19 NOTE — Progress Notes (Signed)
Cardiology Office Note:    Date:  04/19/2019   ID:  Miranda [name], DOB July 10, 1966, MRN [id number]  PCP:  Gayland Curry, DO  Cardiologist:  Candee Furbish, MD   Referring MD: Gayland Curry, DO     History of Present Illness:    Miranda [name] is a 53 y.o. female with a hx of AVR with Dr. Cyndia Bent on 03/12/2017 with 23 mm Edwards Magna-Ease pericardial valve and MV annuloplasty with 28 mm Sorin 3D MEMO ring on8/9/18 (43month of coumadin post then ASA) here for follow-up of aortic valve replacement, cardiomyopathy.  EF remained low post op from 15 to 35%. Also with elevated LFT's 135/100 AST/ ALT (7 2016, 30 on 01/24/17, 51 on 03/10/17 2 days pre-op, 72 on 06/01/17, 109 06/08/17, 108 07/06/17.)  Her EF was 30-35% on prior check.  November 2018.  We are trying to titrate her medications.  With carvedilol at 12.5 she was dizzy.  Lisinopril 10.  Spironolactone 25.  Feeling tired at times.  Minimal shortness of breath with activity, no syncope, no bleeding.  Depressed mood.  Her ALT has decreased.  She had been worked up for possible autoimmune disease  Affecting her liver.  04/22/2018- chief complaint-follow-up cardiomyopathy.  Last visit was with LTruitt Merle NP on 01/04/2018.  [facility] to 50 to 55%.  Unfortunately, [facility] had [facility] back to ACE inhibitor due to excessive cost. She was walking 30 minutes a day 5 times a week salt restriction no chest pain no breathing problems been fishing caught a 7 pound bass.  She saw GI sounds like no clear etiology for her prior elevation of LFTs.  She is off of statin because of this.  04/19/2019-here for the follow-up of cardiomyopathy.  On a prior visit in July with Dr. THollace Kinniershe was having hypotension, blood pressure was running low.  Lisinopril was decreased to 5 mg.  There was some question whether or not we could stop that medicine again. Was dizzy upper 90/60.  Denies any  syncope, chest pain, shortness of breath.  Past Medical History:  Diagnosis Date  . Anemia   . Anxiety   . At risk for sudden cardiac death 3April 17, 2014 . CHF - Combined Systolic + Diastolic Dysfunction. EF of 15-20% with Grade II diastolic dysfunction on echo 10/27/12 10/28/2012  . Coronary artery disease   . Depression   . Dyspnea   . Fibroids   . Heart murmur   . Hypertension   . Hypothyroidism   . S/P cardiac catheterization, 304/17/14 normal coronaries with minimal luminal irregularities in RCA system 10/30/2012  . Thyroid disease    hypothyroidism    Past Surgical History:  Procedure Laterality Date  . AORTIC VALVE REPLACEMENT N/A 03/12/2017   Procedure: AORTIC VALVE REPLACEMENT (AVR) with Mitral Annuloplasty;  Surgeon: BGaye Pollack MD;  Location: [facility] OR;  Service: Open Heart Surgery;  Laterality: N/A;  . LEFT AND RIGHT HEART CATHETERIZATION WITH CORONARY ANGIOGRAM N/A 32014/04/17  Procedure: LEFT AND RIGHT HEART CATHETERIZATION WITH CORONARY ANGIOGRAM;  Surgeon: DLeonie Man MD;  [facility];  Service: Cardiovascular;  Laterality: N/A;  . MITRAL VALVE REPAIR N/A 03/12/2017   Procedure: MITRAL VALVE REPAIR (MVR);  Surgeon: BGaye Pollack MD;  [place]  Service: Open Heart Surgery;  Laterality: N/A;  mitral annuloplasty  . MULTIPLE EXTRACTIONS WITH ALVEOLOPLASTY N/A 01/21/2017   Procedure: Extraction of tooth #'s  4-13 with alveoloplasty;  Surgeon: Lenn Cal, DDS;  Location: [place];  Service: Oral Surgery;  Laterality: N/A;  . RIGHT/LEFT HEART CATH AND CORONARY ANGIOGRAPHY N/A 01/09/2017   Procedure: Right/Left Heart Cath and Coronary Angiography;  Surgeon: Troy Sine, MD;  Location: [place] CV LAB;  Service: Cardiovascular;  Laterality: N/A;  . TEE WITHOUT CARDIOVERSION N/A 03/12/2017   Procedure: TRANSESOPHAGEAL ECHOCARDIOGRAM (TEE);  Surgeon: Gaye Pollack, MD;  Location: [place];  Service: Open Heart Surgery;  Laterality: N/A;    Current  Medications: Current Meds  Medication Sig  . aspirin EC 81 MG EC tablet Take 1 tablet (81 mg total) by mouth daily.  Marland Kitchen buPROPion (WELLBUTRIN XL) 150 MG 24 hr tablet Take 1 tablet (150 mg total) by mouth daily.  . carvedilol (COREG) 3.125 MG tablet Take 1 tablet (3.125 mg total) by mouth 2 (two) times daily.  . diclofenac sodium (VOLTAREN) 1 % GEL Apply 4 g topically 4 (four) times daily.  . ferrous sulfate 325 (65 FE) MG EC tablet Take 1 tablet (325 mg total) by mouth 2 (two) times daily.  . fluticasone (FLONASE) 50 MCG/ACT nasal spray Place 1 spray into both nostrils daily as needed for allergies or rhinitis.  . furosemide (LASIX) 40 MG tablet TAKE 1 TABLET BY MOUTH ONCE DAILY(EXCEPT ON MON AND THURS TAKE 1&1/2 TABLETS)  . levothyroxine (SYNTHROID) 100 MCG tablet Take 1 tablet (100 mcg total) by mouth daily before breakfast.  . lisinopril (ZESTRIL) 2.5 MG tablet Take 1 tablet (2.5 mg total) by mouth daily.  . mirtazapine (REMERON) 15 MG tablet Take 1 tablet (15 mg total) by mouth at bedtime.  . Multiple Vitamins-Minerals (CENTRUM ADULTS PO) Take by mouth daily.  . rosuvastatin (CRESTOR) 10 MG tablet Take 1 tablet (10 mg total) by mouth daily.  Marland Kitchen spironolactone (ALDACTONE) 25 MG tablet Take 0.5 tablets (12.5 mg total) by mouth daily.  . traZODone (DESYREL) 150 MG tablet Take 1 tablet (150 mg total) by mouth at bedtime.  . [DISCONTINUED] carvedilol (COREG) 6.25 MG tablet Take 1 tablet (6.25 mg total) by mouth 2 (two) times daily.  . [DISCONTINUED] lisinopril (ZESTRIL) 5 MG tablet Take 1 tablet (5 mg total) by mouth daily.  . [DISCONTINUED] spironolactone (ALDACTONE) 25 MG tablet Take 1 tablet (25 mg total) by mouth daily.     Allergies:   Codeine   Social History   Socioeconomic History  . Marital status: Single    Spouse name: Not on file  . Number of children: 1  . Years of education: 12th  . Highest education level: Not on file  Occupational History  . Occupation: unemployed     Employer: [employer]  . Financial resource strain: Not hard at all  . Food insecurity    Worry: Never true    Inability: Never true  . Transportation needs    Medical: No    Non-medical: No  Tobacco Use  . Smoking status: Former Smoker    Packs/day: 0.50    Years: 5.00    Pack years: 2.50    Types: Cigars, Cigarettes    Quit date: 08/04/1978    Years since quitting: 40.7  . Smokeless tobacco: Never Used  . Tobacco comment: 1 cigar daily-quit 10/24/12    Substance and Sexual Activity  . Alcohol use: Yes    Alcohol/week: 3.0 standard drinks    Types: 3 Glasses of wine per week    Comment: per month  . Drug  use: Yes    Frequency: 5.0 times per week    Types: Marijuana    Comment: few weeks ago last  . Sexual activity: Not Currently  Lifestyle  . Physical activity    Days per week: 5 days    Minutes per session: 20 min  . Stress: To some extent  Relationships  . Social connections    Talks on phone: More than three times a week    Gets together: More than three times a week    Attends religious service: Never    Active member of club or organization: No    Attends meetings of clubs or organizations: Never    Relationship status: Never married  Other Topics Concern  . Not on file  Social History Narrative   Patient lives at home alone. (Condo)   Caffeine use: 1/2 soda daily   Diet   Martial status:single   Is it one or more stories? Yes   How many persons live in your home? 1   Do you have any pets in your home? No   Current or past profession: Biochemist, clinical   Do you exercise? No   Do you have a living will? No    Do you have a DNR form? No    Do you have signed POA/HPOA forms? No      Family History: The patient's family history includes Arthritis in her paternal uncle; Bone cancer in her maternal uncle and paternal grandfather; Breast cancer in her cousin; Colon polyps in her mother; Heart disease in her maternal grandmother and mother;  Hypertension in her father, maternal grandmother, and mother; Liver disease in her mother; Stroke in her father.  ROS:   Please see the history of present illness.   No fevers chills nausea vomiting syncope bleeding All other review of systems negative  EKGs/Labs/Other Studies Reviewed:    The following studies were reviewed today: Prior hospital records, lab work, EKG reviewed  Echocardiogram 10/19/2017: -EF 50 to 55%.  EKG: 04/22/2018-sinus rhythm 78 T wave inversion V3 through V6 as well as 1, 2, aVF.  No change from prior.  Personally reviewed and interpreted.  Recent Labs: 09/27/2018: Hemoglobin 12.8; Platelets 183; TSH 1.82 01/03/2019: ALT 11; BUN 29; Creatinine, Ser 1.40; Potassium 4.6; Sodium 139  Recent Lipid Panel    Component Value Date/Time   CHOL 192 09/27/2018 1010   CHOL 138 06/01/2017 0931   TRIG 86 09/27/2018 1010   HDL 62 09/27/2018 1010   HDL 49 06/01/2017 0931   CHOLHDL 3.1 09/27/2018 1010   VLDL 11 09/26/2016 1019   LDLCALC 111 (H) 09/27/2018 1010    Physical Exam:    VS:  BP 118/70   Pulse 67   Ht '5\' 5"'$  (1.651 m)   Wt 138 lb (62.6 kg)   LMP 01/17/2015   SpO2 95%   BMI 22.96 kg/m     Wt Readings from Last 3 Encounters:  04/19/19 138 lb (62.6 kg)  02/07/19 138 lb (62.6 kg)  12/03/18 140 lb (63.5 kg)     GEN: Thin, in no acute distress  HEENT: normal  Neck: no JVD, carotid bruits, or masses Cardiac: RRR; no murmurs, rubs, or gallops,no edema Chest scar Respiratory:  clear to auscultation bilaterally, normal work of breathing GI: soft, nontender, nondistended, + BS MS: no deformity or atrophy  Skin: warm and dry, no rash Neuro:  Alert and Oriented x 3, Strength and sensation are intact Psych: euthymic mood, full affect  ASSESSMENT:    1. S/P mitral valve repair   2. Chronic combined systolic and diastolic congestive heart failure ([place])   3. Hypotension due to drugs   4. Aortic valve replaced   5. Mixed hyperlipidemia   6. Idiopathic  hypotension    PLAN:    In order of problems listed above:  Chronic systolic heart failure EF 30-35%-now 50 to 55% on 10/19/2017, nonischemic -No need for ICD.  Currently class I and EF improved. -Hypotension-blood pressure is 90/60 at times with her current medications.  I am going to decrease her carvedilol to 3.125 twice daily, lisinopril two 2.5 once a day, spironolactone to 12.5 once a day.  Essentially, cutting all of these in half.  If her blood pressure still remain low, we will first discontinue the spironolactone and second the lisinopril then third the carvedilol if absolutely necessary.  Status post aortic valve replacement, mitral valve repair -Continue to encouraged antibiotics for dental procedures, no other significant changes.  Echocardiogram reassuring personally reviewed.  Doing well.  Abnormal LFTs - LFTs, mild improvement on last assay.  ALT decreased from 100 down to 36.  No bilirubinemia.  Dr. Havery Moros is questioning whether or not she has an autoimmune situation.  Overall there is no clear etiology for this.  Thankfully, ALT currently 13.  Stable.  Appreciate feedback from Dr. Hollace Kinnier about hypotension.  Changes made as above.  We will see her back in 1 month with Cecille Rubin.  Continue to monitor blood pressures.   Medication Adjustments/Labs and Tests Ordered: Current medicines are reviewed at length with the patient today.  Concerns regarding medicines are outlined above.  No orders of the defined types were placed in this encounter.  Meds ordered this encounter  Medications  . carvedilol (COREG) 3.125 MG tablet    Sig: Take 1 tablet (3.125 mg total) by mouth 2 (two) times daily.    Dispense:  180 tablet    Refill:  1  . lisinopril (ZESTRIL) 2.5 MG tablet    Sig: Take 1 tablet (2.5 mg total) by mouth daily.    Dispense:  90 tablet    Refill:  1  . spironolactone (ALDACTONE) 25 MG tablet    Sig: Take 0.5 tablets (12.5 mg total) by mouth daily.     Dispense:  45 tablet    Refill:  1    Signed, Candee Furbish, MD  04/19/2019 10:00 AM    [facility]

## 2019-04-19 NOTE — Patient Instructions (Signed)
Medication Instructions:  Please decrease your Carvedilol to 3.125 mg twice a day. Decrease Lisinopril to 2.5 mg daily and Spironolactone to 12.5 mg daily. Continue all other medications as listed.  If you need a refill on your cardiac medications before your next appointment, please call your pharmacy.   Follow-Up: At [facility], you and your health needs are our priority.  As part of our continuing mission to provide you with exceptional heart care, we have created designated Provider Care Teams.  These Care Teams include your primary Cardiologist (physician) and Advanced Practice Providers (APPs -  Physician Assistants and Nurse Practitioners) who all work together to provide you with the care you need, when you need it. You will need a follow up appointment in 4 weeks with Truitt Merle, NP.  Please call our office 2 months in advance to schedule this appointment.  You may see Candee Furbish, MD or one of the following Advanced Practice Providers on your designated Care Team:   Truitt Merle, NP Cecilie Kicks, NP . Kathyrn Drown, NP  Thank you for choosing [facility]!!

## 2019-05-12 NOTE — Progress Notes (Signed)
CARDIOLOGY OFFICE NOTE  Date:  05/18/2019    Miranda [name] Date of Birth: 04-08-1966 Medical Record [id number]  PCP:  Gayland Curry, DO  Cardiologist:  Marisa Cyphers   Chief Complaint  Patient presents with  . Follow-up    Seen for Dr. Marlou Porch    History of Present Illness: Miranda [name] is a 53 y.o. female who presents today for a one month check. Seen for Dr. Marlou Porch.   She has a history of iron deficiency anemia, mild nonobstructive CAD,chronic systolic HF, NICM, with previous EFat15%. Her EKG has beenchronically abnormal with T wave inversion in several leads.   Seen foracute CHFbackin the summer of 2018-echo with severeaortic regurgitation andEF back down to 30-35%. Cath with Ost RPDA to RPDA lesion 50% stenosed, severe LV dysfunction mod pulmonary hypertension, and mild 2+MR. EF was 15%. PA pk pressure was 60 mmHg. Severe aortic insuff. Mildly dilated aortic root. Mild coronary obstructive disease with a normal LAD, normal small intermediate vessel, normal left circumflex, and a large dominant RCA with smooth 50% ostial PDA narrowing.)Multiple teeth extractions. Then underwent AVR with Dr. Cyndia Bent on 03/12/2017 with23 mm Edwards Magna-Ease pericardial valve and MV annuloplasty with 28 mm Sorin 3D MEMO ring.She was treated with coumadin for 3 months and then switched over to aspirin.   I have seen her several timessince along with Dr. Marlou Porch- she has had elevation of her LFTs - statin was stopped for a period of time. Echo was updated - EF remained downinitially. Plan was to try and maximize her medicines.  She has been switched over to [facility] - limited echo showed that herEF had improved and was back up to 50 to 55%. When I saw her back for follow up unfortunately, her Delene Loll had been denied and we had to switch back to ACE due to excessive cost. Last seen by me here in the office in June of 2019 and she was doing well. She had seen  Gi who was not able to define why she had had elevated LFTs. We did a telehealth visit back in May - she felt like she was doing ok.   Saw Dr. Marlou Porch a month ago. Had seen here PCP back in July - BP was low and ACE was cut back. ?of whether to stop or not. She had had some dizziness associated. He cut all of her medicines in half for better BP and less symptoms.   The patient does not have symptoms concerning for COVID-19 infection (fever, chills, cough, or new shortness of breath).   Comes in today. Here alone. She really does not feel much different. BP remains low at times. She is dizzy with movement - worse with bending over. No real chest pain. Breathing is ok. No swelling. Weight is stable. She has been trying to go fishing about 3 times a week. She has not had any of her medicines today yet. She was taking most of her medicines all at one time.   Past Medical History:  Diagnosis Date  . Anemia   . Anxiety   . At risk for sudden cardiac death Nov 11, 2012  . CHF - Combined Systolic + Diastolic Dysfunction. EF of 15-20% with Grade II diastolic dysfunction on echo 10/27/12 10/28/2012  . Coronary artery disease   . Depression   . Dyspnea   . Fibroids   . Heart murmur   . Hypertension   . Hypothyroidism   . S/P cardiac catheterization, 11/11/2012, normal coronaries with minimal  luminal irregularities in RCA system 10/30/2012  . Thyroid disease    hypothyroidism    Past Surgical History:  Procedure Laterality Date  . AORTIC VALVE REPLACEMENT N/A 03/12/2017   Procedure: AORTIC VALVE REPLACEMENT (AVR) with Mitral Annuloplasty;  Surgeon: Gaye Pollack, MD;  [facility];  Service: Open Heart Surgery;  Laterality: N/A;  . LEFT AND RIGHT HEART CATHETERIZATION WITH CORONARY ANGIOGRAM N/A 10/29/2012   Procedure: LEFT AND RIGHT HEART CATHETERIZATION WITH CORONARY ANGIOGRAM;  Surgeon: Leonie Man, MD;  Location: [facility] CATH LAB;  Service: Cardiovascular;  Laterality: N/A;  . MITRAL VALVE REPAIR N/A  03/12/2017   Procedure: MITRAL VALVE REPAIR (MVR);  Surgeon: Gaye Pollack, MD;  Location: [place];  Service: Open Heart Surgery;  Laterality: N/A;  mitral annuloplasty  . MULTIPLE EXTRACTIONS WITH ALVEOLOPLASTY N/A 01/21/2017   Procedure: Extraction of tooth #'s 4-13 with alveoloplasty;  Surgeon: Lenn Cal, DDS;  Location: [place];  Service: Oral Surgery;  Laterality: N/A;  . RIGHT/LEFT HEART CATH AND CORONARY ANGIOGRAPHY N/A 01/09/2017   Procedure: Right/Left Heart Cath and Coronary Angiography;  Surgeon: Troy Sine, MD;  Location: [place] CV LAB;  Service: Cardiovascular;  Laterality: N/A;  . TEE WITHOUT CARDIOVERSION N/A 03/12/2017   Procedure: TRANSESOPHAGEAL ECHOCARDIOGRAM (TEE);  Surgeon: Gaye Pollack, MD;  Location: [place];  Service: Open Heart Surgery;  Laterality: N/A;     Medications: Current Meds  Medication Sig  . aspirin EC 81 MG EC tablet Take 1 tablet (81 mg total) by mouth daily.  Marland Kitchen buPROPion (WELLBUTRIN XL) 150 MG 24 hr tablet Take 1 tablet (150 mg total) by mouth daily.  . carvedilol (COREG) 3.125 MG tablet Take 1 tablet (3.125 mg total) by mouth 2 (two) times daily.  . diclofenac sodium (VOLTAREN) 1 % GEL Apply 4 g topically as needed.  . ferrous sulfate 325 (65 FE) MG EC tablet Take 1 tablet (325 mg total) by mouth 2 (two) times daily.  . fluticasone (FLONASE) 50 MCG/ACT nasal spray Place 1 spray into both nostrils daily as needed for allergies or rhinitis.  . furosemide (LASIX) 40 MG tablet Take 1 tablet (40 mg total) by mouth daily.  Marland Kitchen levothyroxine (SYNTHROID) 100 MCG tablet Take 1 tablet (100 mcg total) by mouth daily before breakfast.  . lisinopril (ZESTRIL) 2.5 MG tablet Take 1 tablet (2.5 mg total) by mouth at bedtime.  . mirtazapine (REMERON) 15 MG tablet Take 1 tablet (15 mg total) by mouth at bedtime.  . Multiple Vitamins-Minerals (CENTRUM ADULTS PO) Take by mouth daily.  . rosuvastatin (CRESTOR) 10 MG tablet Take 1 tablet (10 mg total) by mouth  daily.  . traZODone (DESYREL) 150 MG tablet Take 1 tablet (150 mg total) by mouth at bedtime.  . [DISCONTINUED] furosemide (LASIX) 40 MG tablet TAKE 1 TABLET BY MOUTH ONCE DAILY(EXCEPT ON MON AND THURS TAKE 1&1/2 TABLETS)  . [DISCONTINUED] lisinopril (ZESTRIL) 2.5 MG tablet Take 1 tablet (2.5 mg total) by mouth daily.  . [DISCONTINUED] spironolactone (ALDACTONE) 25 MG tablet Take 0.5 tablets (12.5 mg total) by mouth daily.     Allergies: Allergies  Allergen Reactions  . Codeine Hives    Social History: The patient  reports that she quit smoking about 40 years ago. Her smoking use included cigars and cigarettes. She has a 2.50 pack-year smoking history. She has never used smokeless tobacco. She reports current alcohol use of about 3.0 standard drinks of alcohol per week. She reports current drug use. Frequency: 5.00  times per week. Drug: Marijuana.   Family History: The patient's family history includes Arthritis in her paternal uncle; Bone cancer in her maternal uncle and paternal grandfather; Breast cancer in her cousin; Colon polyps in her mother; Heart disease in her maternal grandmother and mother; Hypertension in her father, maternal grandmother, and mother; Liver disease in her mother; Stroke in her father.   Review of Systems: Please see the history of present illness.   All other systems are reviewed and negative.   Physical Exam: VS:  BP 118/68   Pulse 71   Ht 5' 4.5" (1.638 m)   Wt 139 lb 12.8 oz (63.4 kg)   LMP 01/17/2015   SpO2 98%   BMI 23.63 kg/m  .  BMI Body mass index is 23.63 kg/m.  Wt Readings from Last 3 Encounters:  05/18/19 139 lb 12.8 oz (63.4 kg)  04/19/19 138 lb (62.6 kg)  02/07/19 138 lb (62.6 kg)    General: Pleasant. Well developed, well nourished and in no acute distress.   HEENT: Normal.  Neck: Supple, no JVD, carotid bruits, or masses noted.  Cardiac: Regular rate and rhythm. Valve sounds good. Soft outflow murmur.  No edema.  Respiratory:   Lungs are clear to auscultation bilaterally with normal work of breathing.  GI: Soft and nontender.  MS: No deformity or atrophy. Gait and ROM intact.  Skin: Warm and dry. Color is normal.  Neuro:  Strength and sensation are intact and no gross focal deficits noted.  Psych: Alert, appropriate and with normal affect.   LABORATORY DATA:  EKG:  EKG is ordered today. This demonstrates NSR with chronic inferolateral T wave changes. QT is a little longer today but has had on prior tracings.  Lab Results  Component Value Date   WBC 4.8 09/27/2018   HGB 12.8 09/27/2018   HCT 39.3 09/27/2018   PLT 183 09/27/2018   GLUCOSE 84 01/03/2019   CHOL 192 09/27/2018   TRIG 86 09/27/2018   HDL 62 09/27/2018   LDLCALC 111 (H) 09/27/2018   ALT 11 01/03/2019   AST 26 01/03/2019   NA 139 01/03/2019   K 4.6 01/03/2019   CL 99 01/03/2019   CREATININE 1.40 (H) 01/03/2019   BUN 29 (H) 01/03/2019   CO2 23 01/03/2019   TSH 1.82 09/27/2018   INR 0.95 09/14/2017   HGBA1C 6.1 (H) 03/10/2017     BNP (last 3 results) No results for input(s): BNP in the last 8760 hours.  ProBNP (last 3 results) No results for input(s): PROBNP in the last 8760 hours.   Other Studies Reviewed Today:  EchoStudy Conclusions3/2019  - Left ventricle: The cavity size was mildly dilated. Systolic function was normal. The estimated ejection fraction was in the range of 50% to 55%. Features are consistent with a pseudonormal left ventricular filling pattern, with concomitant abnormal relaxation and increased filling pressure (grade 2 diastolic dysfunction). - Aortic valve: A bioprosthesis was present. Valve area (VTI): 1.84 cm^2. Valve area (Vmax): 1.85 cm^2. Valve area (Vmean): 1.83 cm^2. - Mitral valve: Prior procedures included surgical repair. There was mild regurgitation. Valve area by pressure half-time: 2.14 cm^2. Valve area by continuity equation (using LVOT flow): 1.81 cm^2. - Left  atrium: The atrium was mildly dilated.  Notes recorded by Jerline Pain, MD on 10/21/2017 at 10:19 AM EDT Improved EF on Entresto. Continue. Bioprosthetic aortic valve and mitral valve repair is intact.  Candee Furbish, MD      EchoStudy ConclusionsNovember 2018  -  Left ventricle: The cavity size was normal. Wall thickness was normal. Systolic function was moderately to severely reduced. The estimated ejection fraction was in the range of 30% to 35%. Moderate diffuse hypokinesis with distinct regional wall motion abnormalities. Hypokinesis of the inferior myocardium. The study is not technically sufficient to allow evaluation of LV diastolic function. Doppler parameters are consistent with high ventricular filling pressure. - Ventricular septum: Septal motion showed paradox. - Aortic valve: A bioprosthesis was present and functioning normally. Mean gradient (S): 11 mm Hg. Valve area (VTI): 2.08 cm^2. - Mitral valve: S/P MV repair with annuloplasty ring. Moderate diffuse thickening. There was trivial regurgitation directed centrally. - Left atrium: The atrium was moderately to severely dilated. - Right atrium: The atrium was mildly dilated. - Pulmonic valve: There was trivial regurgitation.  Impressions:  - Compared to prior study, there is now a normally functioning AV bioprosthesis and MV repair with trivial central MR.   ASSESSMENT & PLAN:    1. Chronic systolic HF - EF was 30 to 35% - this has improved per last echofrom 10/2017 with EF now up to 50 to 55%.  No indication for ICD. She was not able to afford Entresto and now is back on ACE. She has had all of her heart failure medicines reduced in half due to hypotension - BP still low and she is still dizzy - stopping Aldactone today and cutting Lasix back to just one a day. Will change ACE to QHS dosing - may need to stop and then stop Coreg only if absolutely necessary. Hopefully she will  continue to do well. She is to continue to monitor for swelling, weight gain and continue with salt restriction.   2. Prior AVR, MV repair - reminded of SBE - her last echo is from 10/2017.   3. Prior history of elevated LFTs - unclear etiology for this - Dr. Havery Moros questioned whether or not this was an autoimmune situation. back on low dose statin - rechecking today.   4. Non obstructive CAD - managed medically - no active symptoms noted.   5. HLD - rechecking lab today.   6. Chronic iron deficiency anemia - lab today.   7. COVID-19 Education: The signs and symptoms of COVID-19 were discussed with the patient and how to seek care for testing (follow up with PCP or arrange E-visit).  The importance of social distancing, staying at home, hand hygiene and wearing a mask when out in public were discussed today.  Current medicines are reviewed with the patient today.  The patient does not have concerns regarding medicines other than what has been noted above.  The following changes have been made:  See above.  Labs/ tests ordered today include:    Orders Placed This Encounter  Procedures  . Basic metabolic panel  . CBC  . Hepatic function panel  . Lipid panel  . EKG 12-Lead     Disposition:   FU with me in about 4 to 6 weeks.  Patient is agreeable to this plan and will call if any problems develop in the interim.   SignedTruitt Merle, NP  05/18/2019 8:56 AM  [facility] [address] [place], [place]  [address] Phone: [phone] Fax: [phone]

## 2019-05-18 ENCOUNTER — Ambulatory Visit (INDEPENDENT_AMBULATORY_CARE_PROVIDER_SITE_OTHER): Payer: HMO | Admitting: Nurse Practitioner

## 2019-05-18 ENCOUNTER — Encounter: Payer: Self-pay | Admitting: Nurse Practitioner

## 2019-05-18 ENCOUNTER — Other Ambulatory Visit: Payer: Self-pay

## 2019-05-18 VITALS — BP 118/68 | HR 71 | Ht 64.5 in | Wt 139.8 lb

## 2019-05-18 DIAGNOSIS — Z7189 Other specified counseling: Secondary | ICD-10-CM | POA: Diagnosis not present

## 2019-05-18 DIAGNOSIS — E782 Mixed hyperlipidemia: Secondary | ICD-10-CM

## 2019-05-18 DIAGNOSIS — I952 Hypotension due to drugs: Secondary | ICD-10-CM

## 2019-05-18 DIAGNOSIS — Z7901 Long term (current) use of anticoagulants: Secondary | ICD-10-CM

## 2019-05-18 DIAGNOSIS — Z952 Presence of prosthetic heart valve: Secondary | ICD-10-CM | POA: Diagnosis not present

## 2019-05-18 DIAGNOSIS — Z9889 Other specified postprocedural states: Secondary | ICD-10-CM

## 2019-05-18 DIAGNOSIS — I428 Other cardiomyopathies: Secondary | ICD-10-CM | POA: Diagnosis not present

## 2019-05-18 DIAGNOSIS — I5042 Chronic combined systolic (congestive) and diastolic (congestive) heart failure: Secondary | ICD-10-CM | POA: Diagnosis not present

## 2019-05-18 LAB — BASIC METABOLIC PANEL
BUN/Creatinine Ratio: 24 — ABNORMAL HIGH (ref 9–23)
BUN: 25 mg/dL — ABNORMAL HIGH (ref 6–24)
CO2: 24 mmol/L (ref 20–29)
Calcium: 9.7 mg/dL (ref 8.7–10.2)
Chloride: 104 mmol/L (ref 96–106)
Creatinine, Ser: 1.05 mg/dL — ABNORMAL HIGH (ref 0.57–1.00)
GFR calc Af Amer: 69 mL/min/{1.73_m2} (ref >59)
GFR calc non Af Amer: 60 mL/min/{1.73_m2} (ref >59)
Glucose: 92 mg/dL (ref 65–99)
Potassium: 3.8 mmol/L (ref 3.5–5.2)
Sodium: 141 mmol/L (ref 134–144)

## 2019-05-18 LAB — CBC
Hematocrit: 38.3 % (ref 34.0–46.6)
Hemoglobin: 12.1 g/dL (ref 11.1–15.9)
MCH: 27.9 pg (ref 26.6–33.0)
MCHC: 31.6 g/dL (ref 31.5–35.7)
MCV: 88 fL (ref 79–97)
Platelets: 153 10*3/uL (ref 150–450)
RBC: 4.34 x10E6/uL (ref 3.77–5.28)
RDW: 13.9 % (ref 11.7–15.4)
WBC: 5.3 10*3/uL (ref 3.4–10.8)

## 2019-05-18 LAB — HEPATIC FUNCTION PANEL
ALT: 11 IU/L (ref 0–32)
AST: 19 IU/L (ref 0–40)
Albumin: 4.2 g/dL (ref 3.8–4.9)
Alkaline Phosphatase: 70 IU/L (ref 39–117)
Bilirubin Total: <0.2 mg/dL (ref 0.0–1.2)
Bilirubin, Direct: 0.06 mg/dL (ref 0.00–0.40)
Total Protein: 8.1 g/dL (ref 6.0–8.5)

## 2019-05-18 LAB — LIPID PANEL
Chol/HDL Ratio: 3.2 ratio (ref 0.0–4.4)
Cholesterol, Total: 181 mg/dL (ref 100–199)
HDL: 56 mg/dL (ref >39)
LDL Chol Calc (NIH): 108 mg/dL — ABNORMAL HIGH (ref 0–99)
Triglycerides: 93 mg/dL (ref 0–149)
VLDL Cholesterol Cal: 17 mg/dL (ref 5–40)

## 2019-05-18 MED ORDER — LISINOPRIL 2.5 MG PO TABS: 2.5000 mg | ORAL_TABLET | Freq: Every day | ORAL | 1 refills | Status: DC

## 2019-05-18 MED ORDER — FUROSEMIDE 40 MG PO TABS: 40.0000 mg | ORAL_TABLET | Freq: Every day | ORAL | 2 refills | Status: DC

## 2019-05-18 NOTE — Patient Instructions (Addendum)
After Visit Summary:  We will be checking the following labs today - BMET, CBC, HPF and lipids   Medication Instructions:    Continue with your current medicines. BUT  We are stopping Aldactone  We are cutting the Lasix back to just one pill a day  Move the Lisinopril to night time    If you need a refill on your cardiac medications before your next appointment, please call your pharmacy.     Testing/Procedures To Be Arranged:  N/A  Follow-Up:   See me in about 4 to 6 weeks.     At [facility], you and your health needs are our priority.  As part of our continuing mission to provide you with exceptional heart care, we have created designated Provider Care Teams.  These Care Teams include your primary Cardiologist (physician) and Advanced Practice Providers (APPs -  Physician Assistants and Nurse Practitioners) who all work together to provide you with the care you need, when you need it.  Special Instructions:  . Stay safe, stay home, wash your hands for at least 20 seconds and wear a mask when out in public.  . It was good to talk with you today.  Marland Kitchen Keep a watch on your weight/swelling and continue to restrict salt.    Call the [place] office at [phone] if you have any questions, problems or concerns.

## 2019-06-06 ENCOUNTER — Other Ambulatory Visit: Payer: Self-pay

## 2019-06-06 ENCOUNTER — Other Ambulatory Visit: Payer: HMO

## 2019-06-08 ENCOUNTER — Other Ambulatory Visit: Payer: HMO

## 2019-06-08 ENCOUNTER — Other Ambulatory Visit: Payer: Self-pay

## 2019-06-08 DIAGNOSIS — E039 Hypothyroidism, unspecified: Secondary | ICD-10-CM | POA: Diagnosis not present

## 2019-06-08 DIAGNOSIS — D509 Iron deficiency anemia, unspecified: Secondary | ICD-10-CM | POA: Diagnosis not present

## 2019-06-08 DIAGNOSIS — I952 Hypotension due to drugs: Secondary | ICD-10-CM | POA: Diagnosis not present

## 2019-06-08 DIAGNOSIS — E782 Mixed hyperlipidemia: Secondary | ICD-10-CM | POA: Diagnosis not present

## 2019-06-09 ENCOUNTER — Ambulatory Visit: Payer: HMO | Admitting: Internal Medicine

## 2019-06-09 LAB — COMPLETE METABOLIC PANEL WITH GFR
AG Ratio: 1 (calc) (ref 1.0–2.5)
ALT: 9 U/L (ref 6–29)
AST: 20 U/L (ref 10–35)
Albumin: 4.3 g/dL (ref 3.6–5.1)
Alkaline phosphatase (APISO): 63 U/L (ref 37–153)
BUN: 17 mg/dL (ref 7–25)
CO2: 28 mmol/L (ref 20–32)
Calcium: 9.9 mg/dL (ref 8.6–10.4)
Chloride: 105 mmol/L (ref 98–110)
Creat: 0.99 mg/dL (ref 0.50–1.05)
GFR, Est African American: 74 mL/min/{1.73_m2} (ref >=60)
GFR, Est Non African American: 64 mL/min/{1.73_m2} (ref >=60)
Globulin: 4.1 g/dL (calc) — ABNORMAL HIGH (ref 1.9–3.7)
Glucose, Bld: 91 mg/dL (ref 65–99)
Potassium: 4.3 mmol/L (ref 3.5–5.3)
Sodium: 141 mmol/L (ref 135–146)
Total Bilirubin: 0.3 mg/dL (ref 0.2–1.2)
Total Protein: 8.4 g/dL — ABNORMAL HIGH (ref 6.1–8.1)

## 2019-06-09 LAB — CBC WITH DIFFERENTIAL/PLATELET
Absolute Monocytes: 470 cells/uL (ref 200–950)
Basophils Absolute: 30 cells/uL (ref 0–200)
Basophils Relative: 0.6 %
Eosinophils Absolute: 50 cells/uL (ref 15–500)
Eosinophils Relative: 1 %
HCT: 39.9 % (ref 35.0–45.0)
Hemoglobin: 12.6 g/dL (ref 11.7–15.5)
Lymphs Abs: 1445 cells/uL (ref 850–3900)
MCH: 27.8 pg (ref 27.0–33.0)
MCHC: 31.6 g/dL — ABNORMAL LOW (ref 32.0–36.0)
MCV: 88.1 fL (ref 80.0–100.0)
MPV: 11.3 fL (ref 7.5–12.5)
Monocytes Relative: 9.4 %
Neutro Abs: 3005 cells/uL (ref 1500–7800)
Neutrophils Relative %: 60.1 %
Platelets: 160 10*3/uL (ref 140–400)
RBC: 4.53 10*6/uL (ref 3.80–5.10)
RDW: 13.9 % (ref 11.0–15.0)
Total Lymphocyte: 28.9 %
WBC: 5 10*3/uL (ref 3.8–10.8)

## 2019-06-09 LAB — LIPID PANEL
Cholesterol: 198 mg/dL (ref <200)
HDL: 67 mg/dL (ref >=50)
LDL Cholesterol (Calc): 117 mg/dL (calc) — ABNORMAL HIGH
Non-HDL Cholesterol (Calc): 131 mg/dL (calc) — ABNORMAL HIGH (ref <130)
Total CHOL/HDL Ratio: 3 (calc) (ref <5.0)
Triglycerides: 57 mg/dL (ref <150)

## 2019-06-09 LAB — HEMOGLOBIN A1C
Hgb A1c MFr Bld: 5.4 % of total Hgb (ref <5.7)
Mean Plasma Glucose: 108 (calc)
eAG (mmol/L): 6 (calc)

## 2019-06-09 LAB — TSH: TSH: 2.4 mIU/L

## 2019-06-13 ENCOUNTER — Encounter: Payer: Self-pay | Admitting: Internal Medicine

## 2019-06-13 ENCOUNTER — Other Ambulatory Visit: Payer: Self-pay

## 2019-06-13 ENCOUNTER — Ambulatory Visit (INDEPENDENT_AMBULATORY_CARE_PROVIDER_SITE_OTHER): Payer: HMO | Admitting: Internal Medicine

## 2019-06-13 VITALS — BP 112/68 | HR 75 | Temp 97.9°F | Ht 64.5 in | Wt 137.2 lb

## 2019-06-13 DIAGNOSIS — E782 Mixed hyperlipidemia: Secondary | ICD-10-CM

## 2019-06-13 DIAGNOSIS — Z1211 Encounter for screening for malignant neoplasm of colon: Secondary | ICD-10-CM | POA: Diagnosis not present

## 2019-06-13 DIAGNOSIS — Z0000 Encounter for general adult medical examination without abnormal findings: Secondary | ICD-10-CM

## 2019-06-13 DIAGNOSIS — I5022 Chronic systolic (congestive) heart failure: Secondary | ICD-10-CM

## 2019-06-13 DIAGNOSIS — E039 Hypothyroidism, unspecified: Secondary | ICD-10-CM | POA: Diagnosis not present

## 2019-06-13 DIAGNOSIS — R87612 Low grade squamous intraepithelial lesion on cytologic smear of cervix (LGSIL): Secondary | ICD-10-CM | POA: Diagnosis not present

## 2019-06-13 DIAGNOSIS — Z23 Encounter for immunization: Secondary | ICD-10-CM

## 2019-06-13 NOTE — Progress Notes (Signed)
Location:  [facility] clinic  Provider: Dr. Hollace Kinnier  Code Status:  Goals of Care:  Advanced Directives 03/15/2019  Does Patient Have a Medical Advance Directive? No  Does patient want to make changes to medical advance directive? -  Would patient like information on creating a medical advance directive? No - Patient declined  Pre-existing out of facility DNR order (yellow form or pink MOST form) -     Chief Complaint  Patient presents with  . Annual Exam    CPE with PAP, colorectal screen and breast exam  . Immunizations    Low dose flu shot given today    HPI: Patient is a 53 y.o. female seen today for medical management of chronic diseases.    She states she is doing well. No new complaints today.   Labs reviewed with patient.   She continues to follow a diet low in fat. She is not taking her Crestor everyday.   LMP in 2015. Still experiencing hot flashes occasionally.   She has not seen a dentist recently. Has dentures and reports no issues.   Last eye exam March 2020. Wears glasses. Denies trouble with vision.   Requesting flu vaccination today.   Requesting Cologuard screening today.   Wants dexa and mammogram to be scheduled.     Past Medical History:  Diagnosis Date  . Anemia   . Anxiety   . At risk for sudden cardiac death 2012-10-30  . CHF - Combined Systolic + Diastolic Dysfunction. EF of 15-20% with Grade II diastolic dysfunction on echo 10/27/12 10/28/2012  . Coronary artery disease   . Depression   . Dyspnea   . Fibroids   . Heart murmur   . Hypertension   . Hypothyroidism   . S/P cardiac catheterization, Oct 30, 2012, normal coronaries with minimal luminal irregularities in RCA system 10/30/2012  . Thyroid disease    hypothyroidism    Past Surgical History:  Procedure Laterality Date  . AORTIC VALVE REPLACEMENT N/A 03/12/2017   Procedure: AORTIC VALVE REPLACEMENT (AVR) with Mitral Annuloplasty;  Surgeon: Gaye Pollack, MD;  Location: [facility];   Service: Open Heart Surgery;  Laterality: N/A;  . LEFT AND RIGHT HEART CATHETERIZATION WITH CORONARY ANGIOGRAM N/A 2012-10-30   Procedure: LEFT AND RIGHT HEART CATHETERIZATION WITH CORONARY ANGIOGRAM;  Surgeon: Leonie Man, MD;  Location: [facility] CATH LAB;  Service: Cardiovascular;  Laterality: N/A;  . MITRAL VALVE REPAIR N/A 03/12/2017   Procedure: MITRAL VALVE REPAIR (MVR);  Surgeon: Gaye Pollack, MD;  Location: [place];  Service: Open Heart Surgery;  Laterality: N/A;  mitral annuloplasty  . MULTIPLE EXTRACTIONS WITH ALVEOLOPLASTY N/A 01/21/2017   Procedure: Extraction of tooth #'s 4-13 with alveoloplasty;  Surgeon: Lenn Cal, DDS;  Location: [place];  Service: Oral Surgery;  Laterality: N/A;  . RIGHT/LEFT HEART CATH AND CORONARY ANGIOGRAPHY N/A 01/09/2017   Procedure: Right/Left Heart Cath and Coronary Angiography;  Surgeon: Troy Sine, MD;  Location: [place] CV LAB;  Service: Cardiovascular;  Laterality: N/A;  . TEE WITHOUT CARDIOVERSION N/A 03/12/2017   Procedure: TRANSESOPHAGEAL ECHOCARDIOGRAM (TEE);  Surgeon: Gaye Pollack, MD;  Location: [place];  Service: Open Heart Surgery;  Laterality: N/A;    Allergies  Allergen Reactions  . Codeine Hives    Outpatient Encounter Medications as of 06/13/2019  Medication Sig  . aspirin EC 81 MG EC tablet Take 1 tablet (81 mg total) by mouth daily.  Marland Kitchen buPROPion (WELLBUTRIN XL) 150 MG 24 hr tablet Take  1 tablet (150 mg total) by mouth daily.  . carvedilol (COREG) 3.125 MG tablet Take 1 tablet (3.125 mg total) by mouth 2 (two) times daily.  . diclofenac sodium (VOLTAREN) 1 % GEL Apply 4 g topically as needed.  . ferrous sulfate 325 (65 FE) MG EC tablet Take 1 tablet (325 mg total) by mouth 2 (two) times daily.  . fluticasone (FLONASE) 50 MCG/ACT nasal spray Place 1 spray into both nostrils daily as needed for allergies or rhinitis.  . furosemide (LASIX) 40 MG tablet Take 1 tablet (40 mg total) by mouth daily.  Marland Kitchen levothyroxine (SYNTHROID) 100  MCG tablet Take 1 tablet (100 mcg total) by mouth daily before breakfast.  . lisinopril (ZESTRIL) 2.5 MG tablet Take 1 tablet (2.5 mg total) by mouth at bedtime.  . mirtazapine (REMERON) 15 MG tablet Take 1 tablet (15 mg total) by mouth at bedtime.  . Multiple Vitamins-Minerals (CENTRUM ADULTS PO) Take by mouth daily.  . rosuvastatin (CRESTOR) 10 MG tablet Take 1 tablet (10 mg total) by mouth daily.  . traZODone (DESYREL) 150 MG tablet Take 1 tablet (150 mg total) by mouth at bedtime.   No facility-administered encounter medications on file as of 06/13/2019.     Review of Systems:  Review of Systems  Constitutional: Negative for activity change, appetite change and fatigue.  HENT: Negative for dental problem, hearing loss and trouble swallowing.   Eyes: Negative for photophobia and visual disturbance.  Respiratory: Negative for shortness of breath and wheezing.   Cardiovascular: Negative for chest pain and palpitations.  Gastrointestinal: Negative for abdominal pain, constipation, diarrhea and nausea.  Endocrine: Negative for polydipsia, polyphagia and polyuria.  Genitourinary: Negative for dysuria, frequency, hematuria, vaginal bleeding and vaginal discharge.  Musculoskeletal:       Left knee pain  Skin: Negative.   Neurological: Negative for dizziness and headaches.  Psychiatric/Behavioral: Negative for dysphoric mood and sleep disturbance. The patient is not nervous/anxious.     Health Maintenance  Topic Date Due  . MAMMOGRAM  02/26/2018  . PAP SMEAR-Modifier  01/25/2019  . Fecal DNA (Cologuard)  02/14/2019  . INFLUENZA VACCINE  03/05/2019  . TETANUS/TDAP  01/24/2026  . Hepatitis C Screening  Completed  . HIV Screening  Completed    Physical Exam: Vitals:   06/13/19 0941  BP: 112/68  Pulse: 75  Temp: 97.9 F (36.6 C)  TempSrc: Oral  SpO2: 98%  Weight: 137 lb 3.2 oz (62.2 kg)  Height: 5' 4.5" (1.638 m)   Body mass index is 23.19 kg/m. Physical Exam Vitals signs  reviewed.  Constitutional:      Appearance: Normal appearance. She is normal weight.  HENT:     Head: Normocephalic.  Neck:     Musculoskeletal: Normal range of motion and neck supple.     Thyroid: No thyroid mass, thyromegaly or thyroid tenderness.  Cardiovascular:     Rate and Rhythm: Normal rate and regular rhythm.     Pulses: Normal pulses.     Heart sounds: Normal heart sounds. No murmur.  Pulmonary:     Effort: Pulmonary effort is normal. No respiratory distress.     Breath sounds: Normal breath sounds. No wheezing.  Abdominal:     General: Abdomen is flat. Bowel sounds are normal.     Palpations: Abdomen is soft.  Musculoskeletal: Normal range of motion.     Right lower leg: No edema.     Left lower leg: No edema.  Skin:    General: Skin is  warm and dry.     Capillary Refill: Capillary refill takes less than 2 seconds.  Neurological:     General: No focal deficit present.     Mental Status: She is alert and oriented to person, place, and time. Mental status is at baseline.  Psychiatric:        Mood and Affect: Mood normal.        Behavior: Behavior normal.        Thought Content: Thought content normal.        Judgment: Judgment normal.     Labs reviewed: Basic Metabolic Panel: Recent Labs    09/27/18 1010 01/03/19 0835 05/18/19 0903 06/08/19 0853  NA  --  139 141 141  K  --  4.6 3.8 4.3  CL  --  99 104 105  CO2  --  '23 24 28  '$ GLUCOSE  --  84 92 91  BUN  --  29* 25* 17  CREATININE  --  1.40* 1.05* 0.99  CALCIUM  --  10.1 9.7 9.9  TSH 1.82  --   --  2.40   Liver Function Tests: Recent Labs    01/03/19 0835 05/18/19 0903 06/08/19 0853  AST '26 19 20  '$ ALT '11 11 9  '$ ALKPHOS 62 70  --   BILITOT 0.2 <0.2 0.3  PROT 8.3 8.1 8.4*  ALBUMIN 4.6 4.2  --    No results for input(s): LIPASE, AMYLASE in the last 8760 hours. No results for input(s): AMMONIA in the last 8760 hours. CBC: Recent Labs    09/27/18 1010 05/18/19 0903 06/08/19 0853  WBC 4.8 5.3  5.0  NEUTROABS 2,731  --  3,005  HGB 12.8 12.1 12.6  HCT 39.3 38.3 39.9  MCV 85.6 88 88.1  PLT 183 153 160   Lipid Panel: Recent Labs    09/27/18 1010 05/18/19 0903 06/08/19 0853  CHOL 192 181 198  HDL 62 56 67  LDLCALC 111* 108* 117*  TRIG 86 93 57  CHOLHDL 3.1 3.2 3.0   Lab Results  Component Value Date   HGBA1C 5.4 06/08/2019    Procedures since last visit: No results found.  Assessment/Plan 1. Need for influenza vaccination - Flu Vaccine QUAD 6+ mos PF IM (Fluarix Quad PF)  2. Annual physical exam - PAP, image guided- today - breast examination- today - DG bone density- order - MM digital screening- order  3. Hypothyroidism, unspecified type - TSH normal - continue current levothyroxine therapy  4. Chronic systolic congestive heart failure ([place]) - followed by cardiology - no symptoms of hypotension at this time - continue current beta blocker and diuretics - stay hydrated during hot weather or when exercising  5. Colon cancer screening - order cologuard screening test       Labs/tests ordered:  PAP/ image guided, DG Bone density, MM digital screening- today Next appt:  11/11/2019

## 2019-06-14 LAB — PAP IG (IMAGE GUIDED)

## 2019-06-16 NOTE — Progress Notes (Signed)
CARDIOLOGY OFFICE NOTE  Date:  06/20/2019    Miranda [name] Date of Birth: 1966-01-22 Medical Record [id number]  PCP:  Gayland Curry, DO  Cardiologist:  Marisa Cyphers   Chief Complaint  Patient presents with  . Follow-up    Seen for Dr. Marlou Porch    History of Present Illness: Miranda [name] is a 53 y.o. female who presents today for a follow up visit.  Seen for Dr. Marlou Porch.   She has a history of iron deficiency anemia, mild nonobstructive CAD,chronic systolic HF, NICM, with previous EFat15%. Her EKG has beenchronically abnormal with T wave inversion in several leads.   Seen foracute CHFbackin the summer of 2018-echo with severeaortic regurgitation andEF back down to 30-35%. Cath with Ost RPDA to RPDA lesion 50% stenosed, severe LV dysfunction mod pulmonary hypertension, and mild 2+MR. EF was 15%. PA pk pressure was 60 mmHg. Severe aortic insuff. Mildly dilated aortic root. Mild coronary obstructive disease with a normal LAD, normal small intermediate vessel, normal left circumflex, and a large dominant RCA with smooth 50% ostial PDA narrowing.)Multiple teeth extractions. Then underwent AVR with Dr. Cyndia Bent on 03/12/2017 with23 mm Edwards Magna-Ease pericardial valve and MV annuloplasty with 28 mm Sorin 3D MEMO ring.She was treated with coumadin for 3 months and then switched over to aspirin.   I have seen her several timessince along with Dr. Marlou Porch- she has had elevation of her LFTs - statin was stoppedfor a period of time. Echo was updated - EF remained downinitially. Plan was to try and maximize her medicines.  She has beenswitched over to [facility] - limited echoshowed that herEF had improved and was back up to 50 to 55%.When I saw her back for follow up unfortunately, her Delene Loll had been denied and we had to switch back to ACE due to excessive cost. Last seen by me here in the office in June of 2019 and she was doing well. She had  seen Gi who was not able to define why she had had elevated LFTs. We did a telehealth visit back in May - she felt like she was doing ok.   Saw Dr. Marlou Porch back in September. Had seen here PCP back in July - BP was low and ACE was cut back. ?of whether to stop or not. She had had some dizziness associated. He cut all of her medicines in half for better BP and less symptoms. I then saw her last month - really not feeling any different or better. BP still low at times. She was taking most of her medicines all at one time. I stopped her Aldactone and cut her Lasix back.   The patient does not have symptoms concerning for COVID-19 infection (fever, chills, cough, or new shortness of breath).   Comes in today. Here alone. She is doing well. No chest pain. Breathing is good. Her BP is doing better - she is not dizzy. Weight is down 2 pounds. No swelling. She is quite happy with how she is doing at this time. Lipids not at goal - she has had prior elevation of LFTS - unclear etiology - she admits that sometimes she misses her Crestor.   Past Medical History:  Diagnosis Date  . Anemia   . Anxiety   . At risk for sudden cardiac death 11/16/2012  . CHF - Combined Systolic + Diastolic Dysfunction. EF of 15-20% with Grade II diastolic dysfunction on echo 10/27/12 10/28/2012  . Coronary artery disease   . Depression   .  Dyspnea   . Fibroids   . Heart murmur   . Hypertension   . Hypothyroidism   . S/P cardiac catheterization, 10/29/12, normal coronaries with minimal luminal irregularities in RCA system 10/30/2012  . Thyroid disease    hypothyroidism    Past Surgical History:  Procedure Laterality Date  . AORTIC VALVE REPLACEMENT N/A 03/12/2017   Procedure: AORTIC VALVE REPLACEMENT (AVR) with Mitral Annuloplasty;  Surgeon: Gaye Pollack, MD;  Location: [facility] OR;  Service: Open Heart Surgery;  Laterality: N/A;  . LEFT AND RIGHT HEART CATHETERIZATION WITH CORONARY ANGIOGRAM N/A 10/29/2012   Procedure: LEFT AND  RIGHT HEART CATHETERIZATION WITH CORONARY ANGIOGRAM;  Surgeon: Leonie Man, MD;  Location[facility] CATH LAB;  Service: Cardiovascular;  Laterality: N/A;  . MITRAL VALVE REPAIR N/A 03/12/2017   Procedure: MITRAL VALVE REPAIR (MVR);  Surgeon: Gaye Pollack, MD;  Location: [place];  Service: Open Heart Surgery;  Laterality: N/A;  mitral annuloplasty  . MULTIPLE EXTRACTIONS WITH ALVEOLOPLASTY N/A 01/21/2017   Procedure: Extraction of tooth #'s 4-13 with alveoloplasty;  Surgeon: Lenn Cal, DDS;  Location: [place];  Service: Oral Surgery;  Laterality: N/A;  . RIGHT/LEFT HEART CATH AND CORONARY ANGIOGRAPHY N/A 01/09/2017   Procedure: Right/Left Heart Cath and Coronary Angiography;  Surgeon: Troy Sine, MD;  Location: [place] CV LAB;  Service: Cardiovascular;  Laterality: N/A;  . TEE WITHOUT CARDIOVERSION N/A 03/12/2017   Procedure: TRANSESOPHAGEAL ECHOCARDIOGRAM (TEE);  Surgeon: Gaye Pollack, MD;  Location: [place];  Service: Open Heart Surgery;  Laterality: N/A;     Medications: Current Meds  Medication Sig  . aspirin EC 81 MG EC tablet Take 1 tablet (81 mg total) by mouth daily.  Marland Kitchen buPROPion (WELLBUTRIN XL) 150 MG 24 hr tablet Take 1 tablet (150 mg total) by mouth daily.  . carvedilol (COREG) 3.125 MG tablet Take 1 tablet (3.125 mg total) by mouth 2 (two) times daily.  . diclofenac sodium (VOLTAREN) 1 % GEL Apply 4 g topically as needed.  . ferrous sulfate 325 (65 FE) MG EC tablet Take 1 tablet (325 mg total) by mouth 2 (two) times daily.  . fluticasone (FLONASE) 50 MCG/ACT nasal spray Place 1 spray into both nostrils daily as needed for allergies or rhinitis.  . furosemide (LASIX) 40 MG tablet Take 1 tablet (40 mg total) by mouth daily.  Marland Kitchen levothyroxine (SYNTHROID) 100 MCG tablet Take 1 tablet (100 mcg total) by mouth daily before breakfast.  . lisinopril (ZESTRIL) 2.5 MG tablet Take 1 tablet (2.5 mg total) by mouth at bedtime.  . mirtazapine (REMERON) 15 MG tablet Take 1 tablet (15 mg  total) by mouth at bedtime.  . Multiple Vitamins-Minerals (CENTRUM ADULTS PO) Take by mouth daily.  . rosuvastatin (CRESTOR) 10 MG tablet Take 1 tablet (10 mg total) by mouth daily.  . traZODone (DESYREL) 150 MG tablet Take 1 tablet (150 mg total) by mouth at bedtime.  . [DISCONTINUED] carvedilol (COREG) 3.125 MG tablet Take 1 tablet (3.125 mg total) by mouth 2 (two) times daily.  . [DISCONTINUED] lisinopril (ZESTRIL) 2.5 MG tablet Take 1 tablet (2.5 mg total) by mouth at bedtime.     Allergies: Allergies  Allergen Reactions  . Codeine Hives    Social History: The patient  reports that she quit smoking about 40 years ago. Her smoking use included cigars and cigarettes. She has a 2.50 pack-year smoking history. She has never used smokeless tobacco. She reports current alcohol use of about 3.0 standard drinks  of alcohol per week. She reports current drug use. Frequency: 5.00 times per week. Drug: Marijuana.   Family History: The patient's family history includes Arthritis in her paternal uncle; Bone cancer in her maternal uncle and paternal grandfather; Breast cancer in her cousin; Colon polyps in her mother; Heart disease in her maternal grandmother and mother; Hypertension in her father, maternal grandmother, and mother; Liver disease in her mother; Stroke in her father.   Review of Systems: Please see the history of present illness.   All other systems are reviewed and negative.   Physical Exam: VS:  BP 122/80   Pulse 72   Ht 5' 4.5" (1.638 m)   Wt 137 lb 1.9 oz (62.2 kg)   LMP 01/17/2015   SpO2 98%   BMI 23.17 kg/m  .  BMI Body mass index is 23.17 kg/m.  Wt Readings from Last 3 Encounters:  06/20/19 137 lb 1.9 oz (62.2 kg)  06/13/19 137 lb 3.2 oz (62.2 kg)  05/18/19 139 lb 12.8 oz (63.4 kg)    General: Pleasant. Alert and in no acute distress.   HEENT: Normal.  Neck: Supple, no JVD, carotid bruits, or masses noted.  Cardiac: Regular rate and rhythm. Soft outflow murmur  noted. No edema.  Respiratory:  Lungs are clear to auscultation bilaterally with normal work of breathing.  GI: Soft and nontender.  MS: No deformity or atrophy. Gait and ROM intact.  Skin: Warm and dry. Color is normal.  Neuro:  Strength and sensation are intact and no gross focal deficits noted.  Psych: Alert, appropriate and with normal affect.   LABORATORY DATA:  EKG:  EKG is not ordered today.  Lab Results  Component Value Date   WBC 5.0 06/08/2019   HGB 12.6 06/08/2019   HCT 39.9 06/08/2019   PLT 160 06/08/2019   GLUCOSE 91 06/08/2019   CHOL 198 06/08/2019   TRIG 57 06/08/2019   HDL 67 06/08/2019   LDLCALC 117 (H) 06/08/2019   ALT 9 06/08/2019   AST 20 06/08/2019   NA 141 06/08/2019   K 4.3 06/08/2019   CL 105 06/08/2019   CREATININE 0.99 06/08/2019   BUN 17 06/08/2019   CO2 28 06/08/2019   TSH 2.40 06/08/2019   INR 0.95 09/14/2017   HGBA1C 5.4 06/08/2019     BNP (last 3 results) No results for input(s): BNP in the last 8760 hours.  ProBNP (last 3 results) No results for input(s): PROBNP in the last 8760 hours.   Other Studies Reviewed Today:  EchoStudy Conclusions3/2019  - Left ventricle: The cavity size was mildly dilated. Systolic function was normal. The estimated ejection fraction was in the range of 50% to 55%. Features are consistent with a pseudonormal left ventricular filling pattern, with concomitant abnormal relaxation and increased filling pressure (grade 2 diastolic dysfunction). - Aortic valve: A bioprosthesis was present. Valve area (VTI): 1.84 cm^2. Valve area (Vmax): 1.85 cm^2. Valve area (Vmean): 1.83 cm^2. - Mitral valve: Prior procedures included surgical repair. There was mild regurgitation. Valve area by pressure half-time: 2.14 cm^2. Valve area by continuity equation (using LVOT flow): 1.81 cm^2. - Left atrium: The atrium was mildly dilated.  Notes recorded by Jerline Pain, MD on 10/21/2017 at 10:19  AM EDT Improved EF on Entresto. Continue. Bioprosthetic aortic valve and mitral valve repair is intact.  Candee Furbish, MD      EchoStudy ConclusionsNovember 2018  - Left ventricle: The cavity size was normal. Wall thickness was normal. Systolic function was  moderately to severely reduced. The estimated ejection fraction was in the range of 30% to 35%. Moderate diffuse hypokinesis with distinct regional wall motion abnormalities. Hypokinesis of the inferior myocardium. The study is not technically sufficient to allow evaluation of LV diastolic function. Doppler parameters are consistent with high ventricular filling pressure. - Ventricular septum: Septal motion showed paradox. - Aortic valve: A bioprosthesis was present and functioning normally. Mean gradient (S): 11 mm Hg. Valve area (VTI): 2.08 cm^2. - Mitral valve: S/P MV repair with annuloplasty ring. Moderate diffuse thickening. There was trivial regurgitation directed centrally. - Left atrium: The atrium was moderately to severely dilated. - Right atrium: The atrium was mildly dilated. - Pulmonic valve: There was trivial regurgitation.  Impressions:  - Compared to prior study, there is now a normally functioning AV bioprosthesis and MV repair with trivial central MR.   ASSESSMENT & PLAN:   1. Chronic systolic HF - EF was 30 to 35% -this hasimproved per last echofrom 3/2019with EFnowup to 50 to 55%. No indication for ICD. She was not able to afford Entresto and has had to have all her CHF meds cut back due to symptomatic low BP. She is on very low dose of ACE and beta blocker - on less diuretic - no longer on Aldactone. She is doing better - no symptoms - BP is acceptable. Appears compensated on exam.   2. Prior AVR, MV repair - doing SBE -her last echo is from 10/2017.   3.Prior history of elevated LFTs - unclear etiology for this -Dr. Havery Moros questioned whether or not this  was an autoimmune situation. she is just on low dose statin - last LFTs are normal.   4. Non obstructive CAD -managed medically. No active chest pain.   5. HLD - on low dose statin - not at goal and admits she misses doses - with her prior elevated LFTs - I am not inclined to pust this dose.   6. Chronic iron deficiency anemia - counts are stable.   7. COVID-19 Education: The signs and symptoms of COVID-19 were discussed with the patient and how to seek care for testing (follow up with PCP or arrange E-visit).  The importance of social distancing, staying at home, hand hygiene and wearing a mask when out in public were discussed today.  Current medicines are reviewed with the patient today.  The patient does not have concerns regarding medicines other than what has been noted above.  The following changes have been made:  See above.  Labs/ tests ordered today include:   No orders of the defined types were placed in this encounter.    Disposition:   FU with Dr. Marlou Porch in 4 months.   Patient is agreeable to this plan and will call if any problems develop in the interim.   SignedTruitt Merle, NP  06/20/2019 8:46 AM  [facility] [address] [place], [place]  [address] Phone: [phone] Fax: [phone]

## 2019-06-20 ENCOUNTER — Other Ambulatory Visit: Payer: Self-pay

## 2019-06-20 ENCOUNTER — Encounter: Payer: Self-pay | Admitting: Nurse Practitioner

## 2019-06-20 ENCOUNTER — Ambulatory Visit (INDEPENDENT_AMBULATORY_CARE_PROVIDER_SITE_OTHER): Payer: HMO | Admitting: Nurse Practitioner

## 2019-06-20 VITALS — BP 122/80 | HR 72 | Ht 64.5 in | Wt 137.1 lb

## 2019-06-20 DIAGNOSIS — I5042 Chronic combined systolic (congestive) and diastolic (congestive) heart failure: Secondary | ICD-10-CM | POA: Diagnosis not present

## 2019-06-20 DIAGNOSIS — I428 Other cardiomyopathies: Secondary | ICD-10-CM | POA: Diagnosis not present

## 2019-06-20 DIAGNOSIS — Z9889 Other specified postprocedural states: Secondary | ICD-10-CM | POA: Diagnosis not present

## 2019-06-20 DIAGNOSIS — Z952 Presence of prosthetic heart valve: Secondary | ICD-10-CM | POA: Diagnosis not present

## 2019-06-20 DIAGNOSIS — E782 Mixed hyperlipidemia: Secondary | ICD-10-CM | POA: Diagnosis not present

## 2019-06-20 DIAGNOSIS — I952 Hypotension due to drugs: Secondary | ICD-10-CM | POA: Diagnosis not present

## 2019-06-20 DIAGNOSIS — Z7189 Other specified counseling: Secondary | ICD-10-CM | POA: Diagnosis not present

## 2019-06-20 MED ORDER — CARVEDILOL 3.125 MG PO TABS: 3.1250 mg | ORAL_TABLET | Freq: Two times a day (BID) | ORAL | 3 refills | Status: DC

## 2019-06-20 MED ORDER — LISINOPRIL 2.5 MG PO TABS: 2.5000 mg | ORAL_TABLET | Freq: Every day | ORAL | 3 refills | Status: DC

## 2019-06-20 NOTE — Patient Instructions (Addendum)
After Visit Summary:  We will be checking the following labs today - NONE   Medication Instructions:    Continue with your current medicines.    If you need a refill on your cardiac medications before your next appointment, please call your pharmacy.     Testing/Procedures To Be Arranged:  N/A  Follow-Up:   See Dr. Marlou Porch in 4 months.     At [facility], you and your health needs are our priority.  As part of our continuing mission to provide you with exceptional heart care, we have created designated Provider Care Teams.  These Care Teams include your primary Cardiologist (physician) and Advanced Practice Providers (APPs -  Physician Assistants and Nurse Practitioners) who all work together to provide you with the care you need, when you need it.  Special Instructions:  . Stay safe, stay home, wash your hands for at least 20 seconds and wear a mask when out in public.  . It was good to talk with you today.    Call the [place] office at [phone] if you have any questions, problems or concerns.

## 2019-07-04 ENCOUNTER — Other Ambulatory Visit: Payer: Self-pay

## 2019-07-04 NOTE — Patient Outreach (Signed)
  [facility]) Care Management Chronic Special Needs Program  07/04/2019  Name: Miranda [name] DOB: 11/06/1965  MRN: [id number]  Miranda [name] is enrolled in a chronic special needs plan for Heart Failure. Client called with no answer No answer and HIPAA compliant message left. 1st attempt Plan for 2nd outreach call in 1-2  weeks Chronic care management coordinator will attempt outreach in 1-2 weeks.   Peter Garter RN, Jackquline Denmark, CDE Chronic Care Management Coordinator [facility] Care Management [phone]

## 2019-07-10 ENCOUNTER — Other Ambulatory Visit: Payer: Self-pay | Admitting: Internal Medicine

## 2019-07-11 ENCOUNTER — Other Ambulatory Visit: Payer: Self-pay

## 2019-07-11 NOTE — Patient Outreach (Signed)
  [facility]) Care Management Chronic Special Needs Program  07/11/2019  Name: [name] DOB: 1965/12/15  MRN: [id number]  Ms. Aaron Kirstein is enrolled in a chronic special needs plan for Heart Failure. Client called with no answer No answer and HIPAA compliant message left. 2nd attempt Plan for 3rd outreach call in 1-2 weeks Chronic care management coordinator will attempt outreach in 1-2 weeks.   Peter Garter RN, Jackquline Denmark, CDE Chronic Care Management Coordinator [facility] Management [phone]

## 2019-07-18 ENCOUNTER — Other Ambulatory Visit: Payer: Self-pay

## 2019-07-18 NOTE — Patient Outreach (Signed)
[facility]) Care Management Chronic Special Needs Program  07/18/2019  Name: Miranda [name] DOB: May 13, 1966  MRN: [id number]  [name] is enrolled in a chronic special needs plan for Heart Failure. Reviewed and updated care plan.  Subjective: Client states that she is having less dizziness now that her cardiology NP stopped her spirolactone and lowered her  B/P medications.  States she checks her B/P about 2 times a week and her last reading was 115/70.  States she does weights about 2-3 times a week and forgets to write her weights down.  States her weights have been steady and her last weight was 136lbs.  States she know to call her doctor if her weight goes up 3 lbs in one day.  States she is following a low salt diet.  States she has started walking 1 mile a day depending on the weather and she is using an exercise bike at home.  Denies any chest pains, shortness of breath or swelling. States she had one fall last month when her dog tripped her when they were on a walk.  States she has not completed her Advanced Directives.  Goals Addressed            This Visit's Progress   . COMPLETED:  Acknowledge receipt of Advanced Directive package       Client has forms from [place] office Please review forms and contact me at [phone]  if you need help with the forms.  A Education officer, museum can help you if needed    . COMPLETED: Client understands the importance of follow-up with providers by attending scheduled visits   On track    Reports keeping scheduled appointments    . Client will report no fall or injuries in the next 6 months.(continue 03/15/19)   Not on track    Reports one fall with no injury    . COMPLETED: Client will report no worsening of symptoms related to heart disease within the next 6 months (continue 03/15/19)   On track    No reports of issues with heart disease    . Client will verbalize knowledge of self management of Hypertension as  evidences by BP reading of 140/90 or less; or as defined by provider   On track    Reports B/P less than 140/90 115/70 last result at home    . maintain lifestyle   On track    Starting 11/03/2016 I would like to maintain my lifestyle.    . Maintain timely refills of Heart Failure medication as prescribed within the year    On track    Maintaining refills on time     . Record weight daily for the next 6 months(continue 03/15/19) (pt-stated)   Not on track    Try to write down daily weights on [employer] calendar    . COMPLETED: Visit Primary Care Provider or Cardiologist at least 2 times per year       Completed 09/27/18, 12/03/18, 02/07/19, 06/13/19, 06/23/19     Client declines referral to social worker for Advanced Directives.  Reinforced on importance of having Advanced Directives completed Reviewed HF zones and when to call MD.  Client has good teach back of HF zones Reinforced to follow a low sodium heart health diet Reinforced to take her Crestor as directed by her provider Praised for getting regular exercise Reinforced fall and  safety precautions especial when she is outside Reviewed number for 24-hour [place]  19 precautions  Plan:  Send successful outreach letter with a copy of their individualized care plan, Send individual care plan to provider and Send educational material [employer] 2021 calendar  Chronic care management coordinator will outreach in:  4-6 Months per tier level     Peter Garter RN, [facility] [place] Management Coordinator [facility] [facility] [phone]

## 2019-07-18 NOTE — Progress Notes (Signed)
Agree with care plan as written.  We've been trying to get her and her father to do advance directives for years to no avail.  They must have a dozen blank forms at home!  We reinforced the need to take crestor at her last visit.    Kamorah Nevils L. Jaydenn Boccio, D.O. [facility] [address], [place] [address] Cell Phone (Mon-Fri 8am-5pm):  [phone] On Call:  [phone] & follow prompts after 5pm & weekends Office Phone:  [phone] Office Fax:  [phone]

## 2019-08-28 IMAGING — US US ABDOMEN COMPLETE
1 series · 14 of 25 positions shown · non-contrast
Comparison: None.

CLINICAL DATA: Elevated liver enzymes.

EXAM:
ABDOMEN ULTRASOUND COMPLETE

[Series 1: us abdomen complete · 0.19mm/px · 14 of 110 slices shown]
[im 1/110]
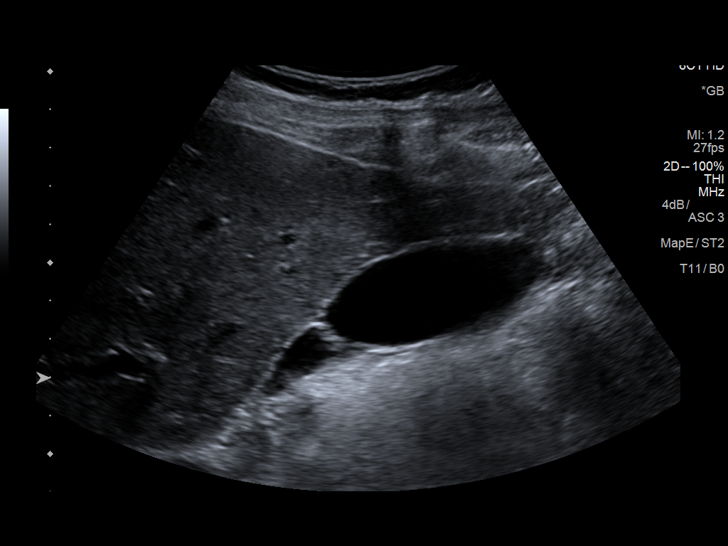
[im 10/110]
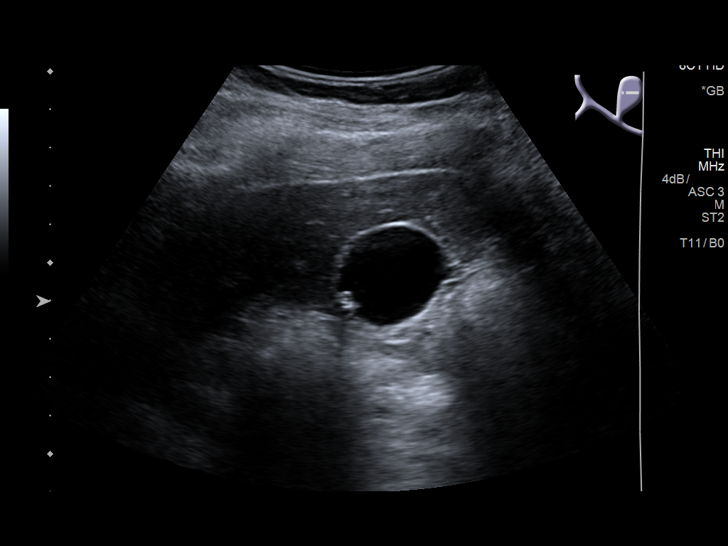
[im 19/110]
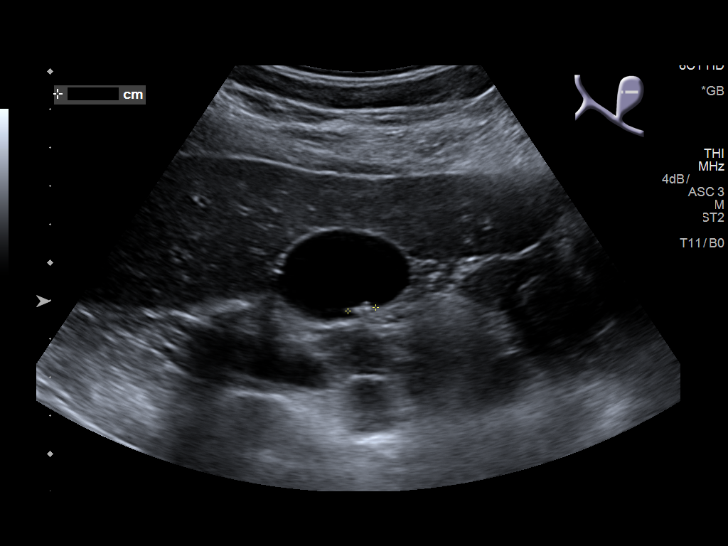
[im 28/110]
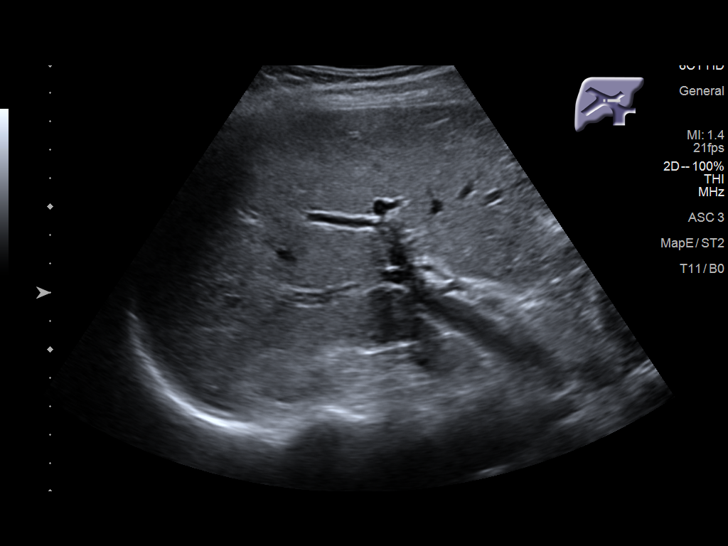
[im 37/110]
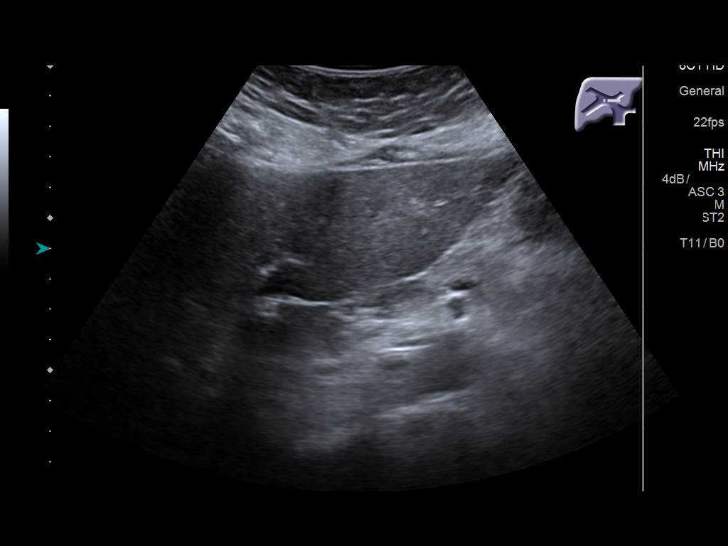
[im 41/110]
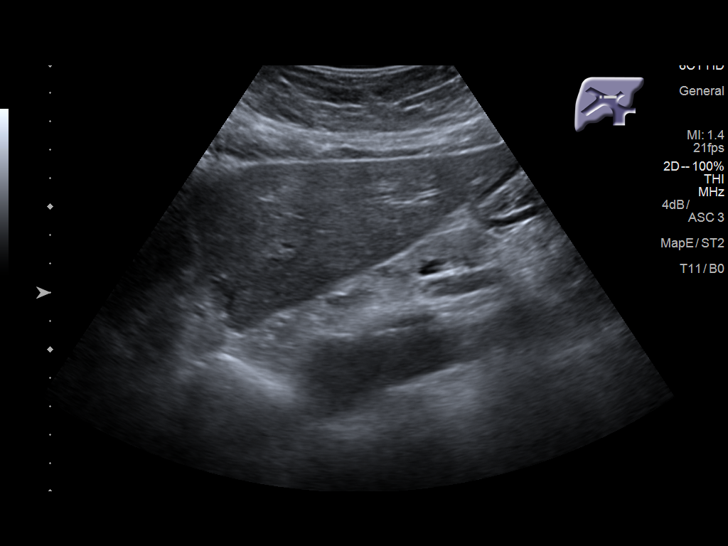
[im 50/110]
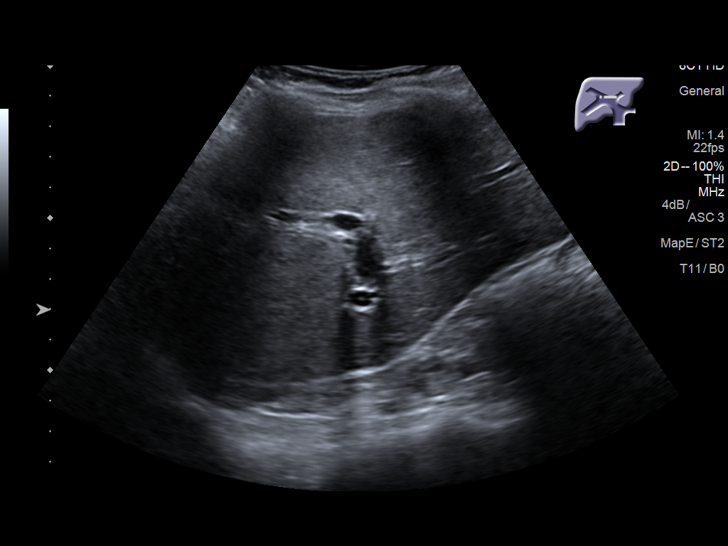
[im 60/110]
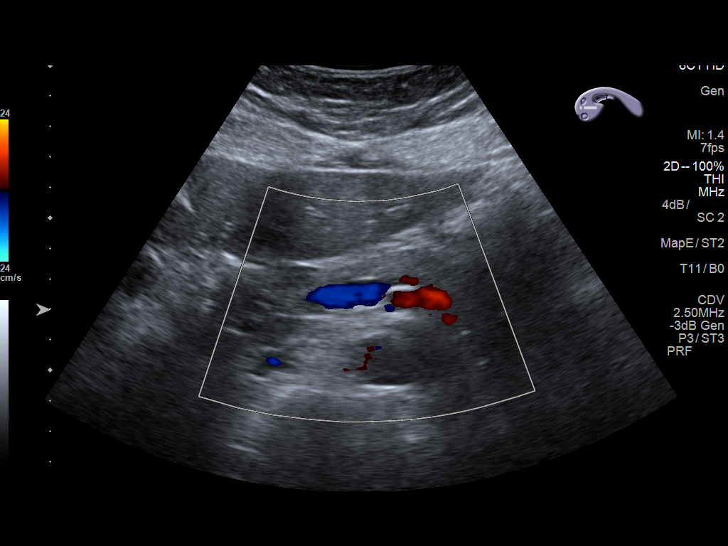
[im 69/110]
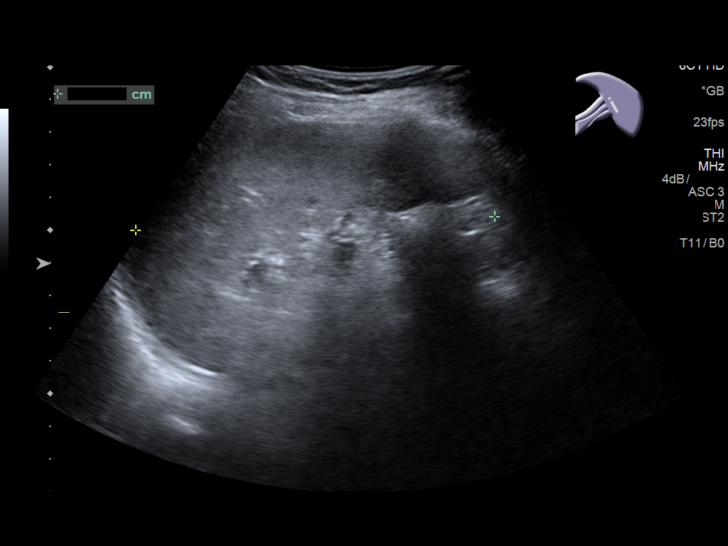
[im 73/110]
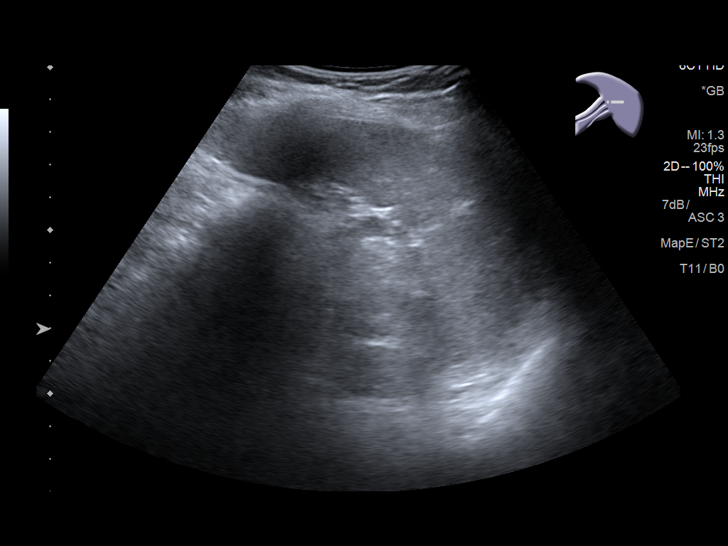
[im 82/110]
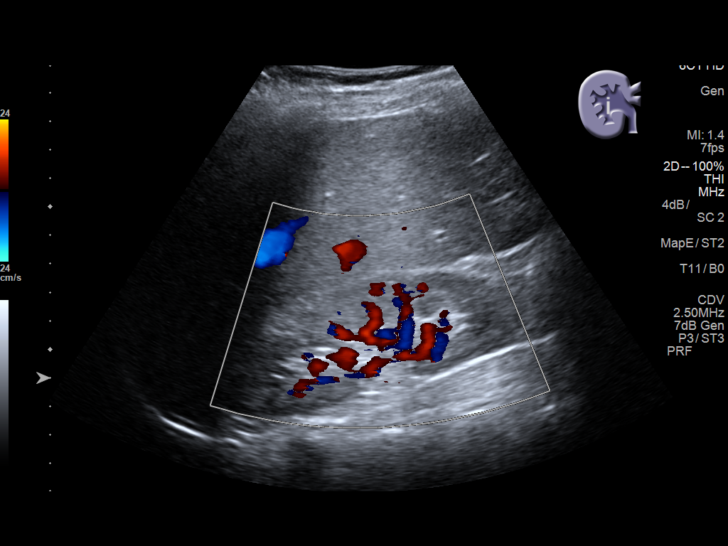
[im 91/110]
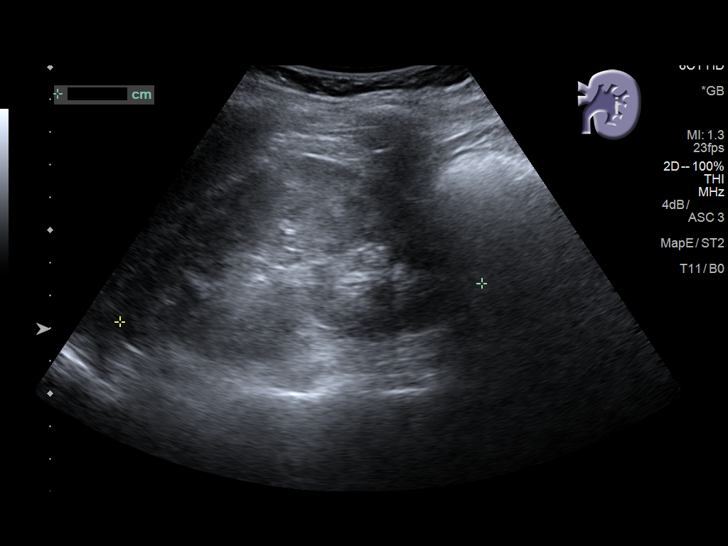
[im 100/110]
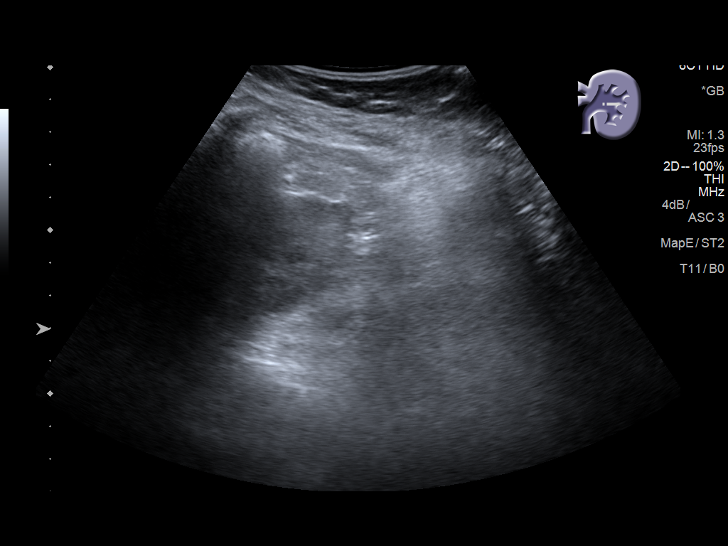
[im 110/110]
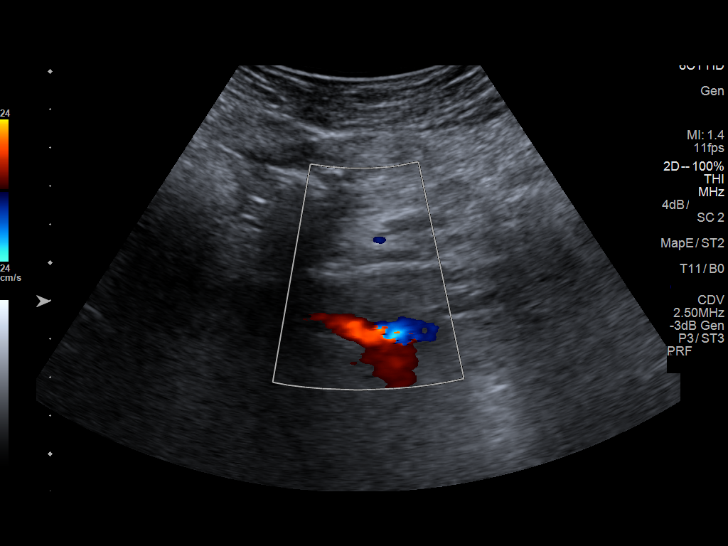

[14 of 25 positions shown; findings below may reference images not displayed]

FINDINGS: Gallbladder: Several small gallstones. Gallbladder wall is not
thickened. Negative sonographic Murphy's sign.

Common bile duct: Diameter: 4.2 mm, normal.

Liver: No focal lesion identified. Within normal limits in
parenchymal echogenicity. Portal vein is patent on color Doppler
imaging with normal direction of blood flow towards the liver.

IVC: No abnormality visualized.

Pancreas: Visualized portion unremarkable.

Spleen: Size and appearance within normal limits.

Right Kidney: Length: 11.6 cm. Echogenicity within normal limits. No
mass or hydronephrosis visualized.

Left Kidney: Length: 11.1 cm. Echogenicity within normal limits. No
mass or hydronephrosis visualized.

Abdominal aorta: No aneurysm visualized.

Other findings: None.
IMPRESSION: Several small stones in the gallbladder.  Otherwise, normal exam.

## 2019-08-31 ENCOUNTER — Other Ambulatory Visit: Payer: Self-pay | Admitting: Internal Medicine

## 2019-08-31 DIAGNOSIS — Z780 Asymptomatic menopausal state: Secondary | ICD-10-CM

## 2019-09-02 ENCOUNTER — Ambulatory Visit
Admission: RE | Admit: 2019-09-02 | Discharge: 2019-09-02 | Disposition: A | Discharge status: Home / Self Care | Payer: HMO | Source: Ambulatory Visit | Attending: Internal Medicine | Admitting: Internal Medicine

## 2019-09-02 ENCOUNTER — Other Ambulatory Visit: Payer: Self-pay

## 2019-09-02 DIAGNOSIS — Z13820 Encounter for screening for osteoporosis: Secondary | ICD-10-CM | POA: Diagnosis not present

## 2019-09-02 DIAGNOSIS — Z780 Asymptomatic menopausal state: Secondary | ICD-10-CM | POA: Diagnosis not present

## 2019-09-02 DIAGNOSIS — Z1231 Encounter for screening mammogram for malignant neoplasm of breast: Secondary | ICD-10-CM | POA: Diagnosis not present

## 2019-09-02 DIAGNOSIS — Z0000 Encounter for general adult medical examination without abnormal findings: Secondary | ICD-10-CM

## 2019-09-02 NOTE — Progress Notes (Signed)
Her bone density remains in the normal range.  I recommend she take vitamin D3 2000 units daily and perform weightbearing exercise (walking, yoga, light weights, tai chi/balance exercises are examples) for 30 mins at least 3 times a week to maintain her bone strength.

## 2019-09-06 ENCOUNTER — Telehealth: Payer: Self-pay

## 2019-09-06 NOTE — Telephone Encounter (Signed)
Marnita said she did received  the cologuard  Last week and will be taking the test this week

## 2019-09-07 NOTE — Telephone Encounter (Signed)
Ok great, we'll be on the loookout for the results.

## 2019-09-08 LAB — COLOGUARD

## 2019-09-12 LAB — EXTERNAL GENERIC LAB PROCEDURE: COLOGUARD: NEGATIVE

## 2019-09-12 LAB — COLOGUARD: COLOGUARD: NEGATIVE

## 2019-09-13 ENCOUNTER — Telehealth: Payer: Self-pay

## 2019-09-13 NOTE — Telephone Encounter (Signed)
Called patient to give results of Cologuard  Which were negative.

## 2019-09-15 LAB — COLOGUARD: Cologuard: NEGATIVE

## 2019-10-04 ENCOUNTER — Other Ambulatory Visit: Payer: Self-pay | Admitting: Cardiology

## 2019-10-13 ENCOUNTER — Other Ambulatory Visit: Payer: Self-pay

## 2019-10-13 ENCOUNTER — Ambulatory Visit (INDEPENDENT_AMBULATORY_CARE_PROVIDER_SITE_OTHER): Payer: HMO | Admitting: Internal Medicine

## 2019-10-13 ENCOUNTER — Encounter: Payer: Self-pay | Admitting: Internal Medicine

## 2019-10-13 VITALS — BP 122/80 | HR 47 | Temp 97.5°F | Ht 65.0 in | Wt 142.0 lb

## 2019-10-13 DIAGNOSIS — F3342 Major depressive disorder, recurrent, in full remission: Secondary | ICD-10-CM | POA: Diagnosis not present

## 2019-10-13 DIAGNOSIS — M1712 Unilateral primary osteoarthritis, left knee: Secondary | ICD-10-CM | POA: Diagnosis not present

## 2019-10-13 DIAGNOSIS — E039 Hypothyroidism, unspecified: Secondary | ICD-10-CM

## 2019-10-13 DIAGNOSIS — F5104 Psychophysiologic insomnia: Secondary | ICD-10-CM

## 2019-10-13 DIAGNOSIS — E782 Mixed hyperlipidemia: Secondary | ICD-10-CM | POA: Diagnosis not present

## 2019-10-13 DIAGNOSIS — I5022 Chronic systolic (congestive) heart failure: Secondary | ICD-10-CM | POA: Diagnosis not present

## 2019-10-13 LAB — LIPID PANEL
Cholesterol: 175 mg/dL (ref <200)
HDL: 58 mg/dL (ref >=50)
LDL Cholesterol (Calc): 103 mg/dL (calc) — ABNORMAL HIGH
Non-HDL Cholesterol (Calc): 117 mg/dL (calc) (ref <130)
Total CHOL/HDL Ratio: 3 (calc) (ref <5.0)
Triglycerides: 56 mg/dL (ref <150)

## 2019-10-13 NOTE — Progress Notes (Signed)
Location:  [facility] Provider:  Giovanny Dugal L. Mariea Clonts, D.O., C.M.D.  Goals of Care:  Advanced Directives 10/13/2019  Does Patient Have a Medical Advance Directive? No  Does patient want to make changes to medical advance directive? -  Would patient like information on creating a medical advance directive? No - Patient declined  Pre-existing out of facility DNR order (yellow form or pink MOST form) -     Chief Complaint  Patient presents with  . Medical Management of Chronic Issues    4 month follow up     HPI: Patient is a 54 y.o. female seen today for medical management of chronic diseases.    Breathing ok except if climbing stairs or walking.  No change.  No chest pain.    Mood has been pretty good.  No hallucinations.  No falls.    She's exercising a little so has some soreness.  Does feel better when doing this.  Walks 4-5 times a week.  Doing light weights.  Uses a punching bag.  When she can't walk outside, she uses a stationary peddler.   Started her program last month.    Cologuard was done and negative so next will be in 09/2022.   Is rubbing the voltaren gel on the left knee which does bother her with exercise.  Insomnia:  Still has nights where she's got only 4 hrs of sleep--probably twice a week.  Still gets hot flashes.    Past Medical History:  Diagnosis Date  . Anemia   . Anxiety   . At risk for sudden cardiac death 10/30/2012  . CHF - Combined Systolic + Diastolic Dysfunction. EF of 15-20% with Grade II diastolic dysfunction on echo 10/27/12 10/28/2012  . Coronary artery disease   . Depression   . Dyspnea   . Fibroids   . Heart murmur   . Hypertension   . Hypothyroidism   . S/P cardiac catheterization, 10-30-12, normal coronaries with minimal luminal irregularities in RCA system 10/30/2012  . Thyroid disease    hypothyroidism    Past Surgical History:  Procedure Laterality Date  . AORTIC VALVE REPLACEMENT N/A 03/12/2017   Procedure: AORTIC VALVE REPLACEMENT  (AVR) with Mitral Annuloplasty;  Surgeon: Gaye Pollack, MD;  [facility];  Service: Open Heart Surgery;  Laterality: N/A;  . LEFT AND RIGHT HEART CATHETERIZATION WITH CORONARY ANGIOGRAM N/A 2012-10-30   Procedure: LEFT AND RIGHT HEART CATHETERIZATION WITH CORONARY ANGIOGRAM;  Surgeon: Leonie Man, MD;  Location: [facility] CATH LAB;  Service: Cardiovascular;  Laterality: N/A;  . MITRAL VALVE REPAIR N/A 03/12/2017   Procedure: MITRAL VALVE REPAIR (MVR);  Surgeon: Gaye Pollack, MD;  Location: [place];  Service: Open Heart Surgery;  Laterality: N/A;  mitral annuloplasty  . MULTIPLE EXTRACTIONS WITH ALVEOLOPLASTY N/A 01/21/2017   Procedure: Extraction of tooth #'s 4-13 with alveoloplasty;  Surgeon: Lenn Cal, DDS;  Location: [place];  Service: Oral Surgery;  Laterality: N/A;  . RIGHT/LEFT HEART CATH AND CORONARY ANGIOGRAPHY N/A 01/09/2017   Procedure: Right/Left Heart Cath and Coronary Angiography;  Surgeon: Troy Sine, MD;  Location: [place] CV LAB;  Service: Cardiovascular;  Laterality: N/A;  . TEE WITHOUT CARDIOVERSION N/A 03/12/2017   Procedure: TRANSESOPHAGEAL ECHOCARDIOGRAM (TEE);  Surgeon: Gaye Pollack, MD;  Location: [place];  Service: Open Heart Surgery;  Laterality: N/A;    Allergies  Allergen Reactions  . Codeine Hives    Outpatient Encounter Medications as of 10/13/2019  Medication Sig  .  aspirin EC 81 MG EC tablet Take 1 tablet (81 mg total) by mouth daily.  Marland Kitchen buPROPion (WELLBUTRIN XL) 150 MG 24 hr tablet Take 1 tablet (150 mg total) by mouth daily.  . carvedilol (COREG) 3.125 MG tablet Take 1 tablet (3.125 mg total) by mouth 2 (two) times daily.  . cholecalciferol (VITAMIN D3) 25 MCG (1000 UNIT) tablet Take 1,000 Units by mouth daily.  . diclofenac sodium (VOLTAREN) 1 % GEL Apply 4 g topically as needed.  . ferrous sulfate 325 (65 FE) MG EC tablet Take 1 tablet (325 mg total) by mouth 2 (two) times daily.  . fluticasone (FLONASE) 50 MCG/ACT nasal spray Place 1 spray  into both nostrils daily as needed for allergies or rhinitis.  . furosemide (LASIX) 40 MG tablet TAKE ONE TABLET BY MOUTH ONCE DAILY, EXCEPT TAKE ONE AND ONE-HALF TABLETS ON MONDAYS AND THURSDAYS  . levothyroxine (SYNTHROID) 100 MCG tablet Take 1 tablet (100 mcg total) by mouth daily before breakfast.  . lisinopril (ZESTRIL) 2.5 MG tablet Take 1 tablet (2.5 mg total) by mouth at bedtime.  . mirtazapine (REMERON) 15 MG tablet Take 1 tablet (15 mg total) by mouth at bedtime.  . Multiple Vitamins-Minerals (CENTRUM ADULTS PO) Take by mouth daily.  . rosuvastatin (CRESTOR) 10 MG tablet Take 1 tablet by mouth once daily  . traZODone (DESYREL) 150 MG tablet Take 1 tablet (150 mg total) by mouth at bedtime.   No facility-administered encounter medications on file as of 10/13/2019.    Review of Systems:  Review of Systems  Constitutional: Negative for chills, fever and malaise/fatigue.  HENT: Negative for congestion, hearing loss and sore throat.   Eyes: Negative for blurred vision.  Respiratory: Negative for cough and shortness of breath.   Cardiovascular: Negative for chest pain, palpitations and leg swelling.  Gastrointestinal: Negative for abdominal pain, blood in stool, constipation, diarrhea and melena.  Genitourinary: Negative for dysuria, frequency and urgency.       Sometimes gets an urge to go but then does not have to go--may happen 2x per week  Musculoskeletal: Positive for joint pain (left knee). Negative for back pain and falls.  Skin: Negative for itching and rash.  Neurological: Negative for dizziness and loss of consciousness.  Endo/Heme/Allergies: Does not bruise/bleed easily.  Psychiatric/Behavioral: Negative for depression, hallucinations and memory loss. The patient has insomnia. The patient is not nervous/anxious.     Health Maintenance  Topic Date Due  . Fecal DNA (Cologuard)  02/14/2019  . MAMMOGRAM  09/01/2021  . PAP SMEAR-Modifier  06/12/2022  . TETANUS/TDAP   01/24/2026  . INFLUENZA VACCINE  Completed  . Hepatitis C Screening  Completed  . HIV Screening  Completed    Physical Exam: Vitals:   10/13/19 0930  BP: 122/80  Pulse: (!) 47  Temp: (!) 97.5 F (36.4 C)  TempSrc: Temporal  SpO2: 93%  Weight: 142 lb (64.4 kg)  Height: 5\' 5"  (1.651 m)   Body mass index is 23.63 kg/m. Physical Exam Vitals and nursing note reviewed.  Constitutional:      Appearance: Normal appearance.  HENT:     Head: Normocephalic and atraumatic.  Eyes:     Comments: glasses  Cardiovascular:     Rate and Rhythm: Normal rate and regular rhythm.     Pulses: Normal pulses.     Heart sounds: Normal heart sounds.  Pulmonary:     Effort: Pulmonary effort is normal.     Breath sounds: Normal breath sounds. No wheezing, rhonchi  or rales.  Musculoskeletal:        General: Normal range of motion.     Right lower leg: No edema.     Left lower leg: No edema.  Skin:    General: Skin is warm and dry.  Neurological:     General: No focal deficit present.     Mental Status: She is alert.  Psychiatric:        Mood and Affect: Mood normal.     Labs reviewed: Basic Metabolic Panel: Recent Labs    01/03/19 0835 05/18/19 0903 06/08/19 0853  NA 139 141 141  K 4.6 3.8 4.3  CL 99 104 105  CO2 23 24 28   GLUCOSE 84 92 91  BUN 29* 25* 17  CREATININE 1.40* 1.05* 0.99  CALCIUM 10.1 9.7 9.9  TSH  --   --  2.40   Liver Function Tests: Recent Labs    01/03/19 0835 05/18/19 0903 06/08/19 0853  AST 26 19 20   ALT 11 11 9   ALKPHOS 62 70  --   BILITOT 0.2 <0.2 0.3  PROT 8.3 8.1 8.4*  ALBUMIN 4.6 4.2  --    No results for input(s): LIPASE, AMYLASE in the last 8760 hours. No results for input(s): AMMONIA in the last 8760 hours. CBC: Recent Labs    05/18/19 0903 06/08/19 0853  WBC 5.3 5.0  NEUTROABS  --  3,005  HGB 12.1 12.6  HCT 38.3 39.9  MCV 88 88.1  PLT 153 160   Lipid Panel: Recent Labs    05/18/19 0903 06/08/19 0853  CHOL 181 198  HDL 56  67  LDLCALC 108* 117*  TRIG 93 57  CHOLHDL 3.2 3.0   Lab Results  Component Value Date   HGBA1C 5.4 06/08/2019    Assessment/Plan 1. Mixed hyperlipidemia - LDL remained above goal last time and I recommended she start exercising--she has been since last month -cont current cholesterol medications also and healthy diet - Lipid panel  2. Hypothyroidism, unspecified type -clinically and chemically euthyroid on current supplementation  3. Chronic systolic congestive heart failure (HCC) -stable symptoms of dyspnea, cont exercise and CHF regimen  4. Recurrent major depressive disorder, in full remission ([place]) -mood has been good with remeron, trazodone  5. Primary osteoarthritis of left knee -bothering her since she started exercising, but encouraged to continue to strengthen muscles around knee, use voltaren gel  6. Psychophysiological insomnia -sleeps most nights well, also so we opted not to rock the boat with her medications  Labs/tests ordered:   Lab Orders     Lipid panel  Next appt:  4 mos med mgt  Tristina Sahagian L. Camerin Ladouceur, D.O. [facility] [address], [place] [address] Cell Phone (Mon-Fri 8am-5pm):  [phone] On Call:  [phone] & follow prompts after 5pm & weekends Office Phone:  [phone] Office Fax:  [phone]

## 2019-10-14 ENCOUNTER — Telehealth: Payer: Self-pay

## 2019-10-14 MED ORDER — ROSUVASTATIN CALCIUM 10 MG PO TABS: 20.0000 mg | ORAL_TABLET | Freq: Every day | ORAL | 1 refills | Status: DC

## 2019-10-14 NOTE — Telephone Encounter (Signed)
-----   Message from Gayland Curry, DO sent at 10/14/2019  5:50 AM EST ----- Bad cholesterol has trended down since she's been on her exercise regimen along with her crestor 10mg .  I would recommend continuing her exercise program but also increasing her crestor to 20mg  if she can tolerate it.  If she agrees, please note and send increased dose to pharmacy (of course she could take two until she uses up current supply)

## 2019-10-14 NOTE — Progress Notes (Signed)
Bad cholesterol has trended down since she's been on her exercise regimen along with her crestor 10mg .  I would recommend continuing her exercise program but also increasing her crestor to 20mg  if she can tolerate it.  If she agrees, please note and send increased dose to pharmacy (of course she could take two until she uses up current supply)

## 2019-10-14 NOTE — Telephone Encounter (Signed)
Patient agreed to changing medicine to 20 mg Crestor. rx sent to pharmacy by e-script

## 2019-10-21 ENCOUNTER — Other Ambulatory Visit: Payer: Self-pay

## 2019-10-21 ENCOUNTER — Encounter: Payer: Self-pay | Admitting: Cardiology

## 2019-10-21 ENCOUNTER — Ambulatory Visit (INDEPENDENT_AMBULATORY_CARE_PROVIDER_SITE_OTHER): Payer: HMO | Admitting: Cardiology

## 2019-10-21 VITALS — BP 140/90 | HR 76 | Ht 65.0 in | Wt 145.8 lb

## 2019-10-21 DIAGNOSIS — I5042 Chronic combined systolic (congestive) and diastolic (congestive) heart failure: Secondary | ICD-10-CM | POA: Diagnosis not present

## 2019-10-21 DIAGNOSIS — I428 Other cardiomyopathies: Secondary | ICD-10-CM

## 2019-10-21 DIAGNOSIS — E7849 Other hyperlipidemia: Secondary | ICD-10-CM

## 2019-10-21 DIAGNOSIS — Z952 Presence of prosthetic heart valve: Secondary | ICD-10-CM | POA: Diagnosis not present

## 2019-10-21 DIAGNOSIS — I952 Hypotension due to drugs: Secondary | ICD-10-CM | POA: Diagnosis not present

## 2019-10-21 DIAGNOSIS — Z9889 Other specified postprocedural states: Secondary | ICD-10-CM

## 2019-10-21 MED ORDER — LISINOPRIL 5 MG PO TABS: 5.0000 mg | ORAL_TABLET | Freq: Every day | ORAL | 3 refills | Status: DC

## 2019-10-21 NOTE — Patient Instructions (Signed)
Medication Instructions:  Please increase Lisinopril to 5 mg a day.  Continue all other medications as listed.  *If you need a refill on your cardiac medications before your next appointment, please call your pharmacy*  Follow-Up: At [facility], you and your health needs are our priority.  As part of our continuing mission to provide you with exceptional heart care, we have created designated Provider Care Teams.  These Care Teams include your primary Cardiologist (physician) and Advanced Practice Providers (APPs -  Physician Assistants and Nurse Practitioners) who all work together to provide you with the care you need, when you need it.  We recommend signing up for the patient portal called "MyChart".  Sign up information is provided on this After Visit Summary.  MyChart is used to connect with patients for Virtual Visits (Telemedicine).  Patients are able to view lab/test results, encounter notes, upcoming appointments, etc.  Non-urgent messages can be sent to your provider as well.   To learn more about what you can do with MyChart, go to [email or web address].    Your next appointment:   6 month(s)  The format for your next appointment:   In Person  Provider:   Truitt Merle, NP and 1 year with Dr Marlou Porch.  Thank you for choosing [facility]!!

## 2019-10-21 NOTE — Progress Notes (Signed)
Cardiology Office Note:    Date:  10/21/2019   ID:  Miranda [name], DOB 1965-12-14, MRN [id number]  PCP:  Gayland Curry, DO  Cardiologist:  Candee Furbish, MD  Electrophysiologist:  None   Referring MD: Gayland Curry, DO     History of Present Illness:    Miranda [name] is a 54 y.o. female here for the follow-up of nonischemic cardiomyopathy, prior EF 15%, now normal.  Had aortic valve replacement as well as mitral valve repair.  Doing well.  Her blood pressure was a little bit elevated.  Statin medication, Crestor was recently increased.  Dr. Mariea Clonts made the adjustment. Overall she has been doing well no fevers chills nausea vomiting syncope bleeding.  No shortness of breath no orthopnea.    Past Medical History:  Diagnosis Date  . Anemia   . Anxiety   . At risk for sudden cardiac death 2012-11-12  . CHF - Combined Systolic + Diastolic Dysfunction. EF of 15-20% with Grade II diastolic dysfunction on echo 10/27/12 10/28/2012  . Coronary artery disease   . Depression   . Dyspnea   . Fibroids   . Heart murmur   . Hypertension   . Hypothyroidism   . S/P cardiac catheterization, Nov 12, 2012, normal coronaries with minimal luminal irregularities in RCA system 10/30/2012  . Thyroid disease    hypothyroidism    Past Surgical History:  Procedure Laterality Date  . AORTIC VALVE REPLACEMENT N/A 03/12/2017   Procedure: AORTIC VALVE REPLACEMENT (AVR) with Mitral Annuloplasty;  Surgeon: Gaye Pollack, MD;  [facility];  Service: Open Heart Surgery;  Laterality: N/A;  . LEFT AND RIGHT HEART CATHETERIZATION WITH CORONARY ANGIOGRAM N/A 12-Nov-2012   Procedure: LEFT AND RIGHT HEART CATHETERIZATION WITH CORONARY ANGIOGRAM;  Surgeon: Leonie Man, MD;  Location: [facility] CATH LAB;  Service: Cardiovascular;  Laterality: N/A;  . MITRAL VALVE REPAIR N/A 03/12/2017   Procedure: MITRAL VALVE REPAIR (MVR);  Surgeon: Gaye Pollack, MD;  Location: [place];  Service: Open Heart Surgery;  Laterality: N/A;   mitral annuloplasty  . MULTIPLE EXTRACTIONS WITH ALVEOLOPLASTY N/A 01/21/2017   Procedure: Extraction of tooth #'s 4-13 with alveoloplasty;  Surgeon: Lenn Cal, DDS;  Location: [place];  Service: Oral Surgery;  Laterality: N/A;  . RIGHT/LEFT HEART CATH AND CORONARY ANGIOGRAPHY N/A 01/09/2017   Procedure: Right/Left Heart Cath and Coronary Angiography;  Surgeon: Troy Sine, MD;  Location: [place] CV LAB;  Service: Cardiovascular;  Laterality: N/A;  . TEE WITHOUT CARDIOVERSION N/A 03/12/2017   Procedure: TRANSESOPHAGEAL ECHOCARDIOGRAM (TEE);  Surgeon: Gaye Pollack, MD;  Location: [place];  Service: Open Heart Surgery;  Laterality: N/A;    Current Medications: Current Meds  Medication Sig  . aspirin EC 81 MG EC tablet Take 1 tablet (81 mg total) by mouth daily.  Marland Kitchen buPROPion (WELLBUTRIN XL) 150 MG 24 hr tablet Take 1 tablet (150 mg total) by mouth daily.  . carvedilol (COREG) 3.125 MG tablet Take 1 tablet (3.125 mg total) by mouth 2 (two) times daily.  . cholecalciferol (VITAMIN D3) 25 MCG (1000 UNIT) tablet Take 1,000 Units by mouth daily.  . diclofenac sodium (VOLTAREN) 1 % GEL Apply 4 g topically as needed.  . ferrous sulfate 325 (65 FE) MG EC tablet Take 1 tablet (325 mg total) by mouth 2 (two) times daily.  . fluticasone (FLONASE) 50 MCG/ACT nasal spray Place 1 spray into both nostrils daily as needed for allergies or rhinitis.  . furosemide (LASIX) 40 MG  tablet TAKE ONE TABLET BY MOUTH ONCE DAILY, EXCEPT TAKE ONE AND ONE-HALF TABLETS ON MONDAYS AND THURSDAYS  . levothyroxine (SYNTHROID) 100 MCG tablet Take 1 tablet (100 mcg total) by mouth daily before breakfast.  . lisinopril (ZESTRIL) 5 MG tablet Take 1 tablet (5 mg total) by mouth at bedtime.  . mirtazapine (REMERON) 15 MG tablet Take 1 tablet (15 mg total) by mouth at bedtime.  . Multiple Vitamins-Minerals (CENTRUM ADULTS PO) Take by mouth daily.  . rosuvastatin (CRESTOR) 10 MG tablet Take 2 tablets (20 mg total) by mouth  daily.  . traZODone (DESYREL) 150 MG tablet Take 1 tablet (150 mg total) by mouth at bedtime.  . [DISCONTINUED] lisinopril (ZESTRIL) 2.5 MG tablet Take 1 tablet (2.5 mg total) by mouth at bedtime.     Allergies:   Codeine   Social History   Socioeconomic History  . Marital status: Single    Spouse name: Not on file  . Number of children: 1  . Years of education: 12th  . Highest education level: Not on file  Occupational History  . Occupation: unemployed    Employer: [employer]  Tobacco Use  . Smoking status: Former Smoker    Packs/day: 0.50    Years: 5.00    Pack years: 2.50    Types: Cigars, Cigarettes    Quit date: 08/04/1978    Years since quitting: 41.2  . Smokeless tobacco: Never Used  . Tobacco comment: 1 cigar daily-quit 10/24/12    Substance and Sexual Activity  . Alcohol use: Yes    Alcohol/week: 3.0 standard drinks    Types: 3 Glasses of wine per week    Comment: per month  . Drug use: Yes    Frequency: 5.0 times per week    Types: Marijuana    Comment: few weeks ago last  . Sexual activity: Not Currently  Other Topics Concern  . Not on file  Social History Narrative   Patient lives at home alone. (Condo)   Caffeine use: 1/2 soda daily   Diet   Martial status:single   Is it one or more stories? Yes   How many persons live in your home? 1   Do you have any pets in your home? No   Current or past profession: Biochemist, clinical   Do you exercise? No   Do you have a living will? No    Do you have a DNR form? No    Do you have signed POA/HPOA forms? No    Social Determinants of Radio broadcast assistant Strain:   . Difficulty of Paying Living Expenses:   Food Insecurity: No Food Insecurity  . Worried About Charity fundraiser in the Last Year: Never true  . Ran Out of Food in the Last Year: Never true  Transportation Needs: No Transportation Needs  . Lack of Transportation (Medical): No  . Lack of Transportation (Non-Medical): No  Physical  Activity:   . Days of Exercise per Week:   . Minutes of Exercise per Session:   Stress:   . Feeling of Stress :   Social Connections:   . Frequency of Communication with Friends and Family:   . Frequency of Social Gatherings with Friends and Family:   . Attends Religious Services:   . Active Member of Clubs or Organizations:   . Attends Archivist Meetings:   Marland Kitchen Marital Status:      Family History: The patient's family history includes Arthritis in her paternal  uncle; Bone cancer in her maternal uncle and paternal grandfather; Breast cancer in her cousin; Colon polyps in her mother; Heart disease in her maternal grandmother and mother; Hypertension in her father, maternal grandmother, and mother; Liver disease in her mother; Stroke in her father.  ROS:   Please see the history of present illness.     All other systems reviewed and are negative.  EKGs/Labs/Other Studies Reviewed:    The following studies were reviewed today: Echocardiogram 2019-EF 50 to 55%  EKG:  EKG is not ordered today.  Prior on 05/18/2019 showed sinus rhythm PVCs inferior T wave inversions.  Recent Labs: 06/08/2019: ALT 9; BUN 17; Creat 0.99; Hemoglobin 12.6; Platelets 160; Potassium 4.3; Sodium 141; TSH 2.40  Recent Lipid Panel    Component Value Date/Time   CHOL 175 10/13/2019 1032   CHOL 181 05/18/2019 0903   TRIG 56 10/13/2019 1032   HDL 58 10/13/2019 1032   HDL 56 05/18/2019 0903   CHOLHDL 3.0 10/13/2019 1032   VLDL 11 09/26/2016 1019   LDLCALC 103 (H) 10/13/2019 1032    Physical Exam:    VS:  BP 140/90   Pulse 76   Ht 5\' 5"  (1.651 m)   Wt 145 lb 12.8 oz (66.1 kg)   LMP 01/17/2015   SpO2 98%   BMI 24.26 kg/m     Wt Readings from Last 3 Encounters:  10/21/19 145 lb 12.8 oz (66.1 kg)  10/13/19 142 lb (64.4 kg)  06/20/19 137 lb 1.9 oz (62.2 kg)     GEN:  Well nourished, well developed in no acute distress HEENT: Normal NECK: No JVD; No carotid bruits LYMPHATICS: No  lymphadenopathy CARDIAC: RRR, no murmurs, rubs, gallops RESPIRATORY:  Clear to auscultation without rales, wheezing or rhonchi  ABDOMEN: Soft, non-tender, non-distended MUSCULOSKELETAL:  No edema; No deformity  SKIN: Warm and dry NEUROLOGIC:  Alert and oriented x 3 PSYCHIATRIC:  Normal affect   ASSESSMENT:    1. Aortic valve replaced   2. Hypotension due to drugs   3. Chronic combined systolic and diastolic congestive heart failure ([place])   4. NICM (nonischemic cardiomyopathy) ([place])   5. S/P mitral valve repair   6. Other hyperlipidemia    PLAN:    In order of problems listed above:  Nonischemic cardiomyopathy, chronic systolic heart failure -EF in 2019 was improved to 50 to 55%.  Excellent.  She did at one point have symptomatic low blood pressure.  Her blood pressure now has been running about 140/90.  We will go ahead and increase her lisinopril from 2.5 mg once a day to 5 mg once a day.  Continue with her low-dose carvedilol 3.125 mg twice a day.  She is no longer on Aldactone.  We tried Entresto in the past unable to afford.  Doing very well.  Well compensated.  NYHA class I.  Aortic valve replacement, mitral valve repair -Doing well.  Continue with dental prophylaxis.  Last echo 2019 excellent.  Hyperlipidemia -Last LDL from outside labs was 103.  ALT 9, hemoglobin 12.6.  Dr. Mariea Clonts recently increased Crestor to 20 mg once a day.  She will be monitoring.  Agree.  Chronic iron deficiency anemia -Hemoglobin stable as above.  Prior history of elevated LFTs -Now normal.  She has seen Dr. Havery Moros in the past.   Medication Adjustments/Labs and Tests Ordered: Current medicines are reviewed at length with the patient today.  Concerns regarding medicines are outlined above.  No orders of the defined types were  placed in this encounter.  Meds ordered this encounter  Medications  . lisinopril (ZESTRIL) 5 MG tablet    Sig: Take 1 tablet (5 mg total) by mouth at bedtime.     Dispense:  90 tablet    Refill:  3    Pt will call when she needs 5 mg tablets filled - d/c rx for 2.5 mg    Patient Instructions  Medication Instructions:  Please increase Lisinopril to 5 mg a day.  Continue all other medications as listed.  *If you need a refill on your cardiac medications before your next appointment, please call your pharmacy*  Follow-Up: At [facility], you and your health needs are our priority.  As part of our continuing mission to provide you with exceptional heart care, we have created designated Provider Care Teams.  These Care Teams include your primary Cardiologist (physician) and Advanced Practice Providers (APPs -  Physician Assistants and Nurse Practitioners) who all work together to provide you with the care you need, when you need it.  We recommend signing up for the patient portal called "MyChart".  Sign up information is provided on this After Visit Summary.  MyChart is used to connect with patients for Virtual Visits (Telemedicine).  Patients are able to view lab/test results, encounter notes, upcoming appointments, etc.  Non-urgent messages can be sent to your provider as well.   To learn more about what you can do with MyChart, go to [email or web address].    Your next appointment:   6 month(s)  The format for your next appointment:   In Person  Provider:   Truitt Merle, NP and 1 year with Dr Marlou Porch.  Thank you for choosing [facility]!!        Signed, Candee Furbish, MD  10/21/2019 8:45 AM    [place]

## 2019-10-24 ENCOUNTER — Ambulatory Visit: Payer: Self-pay

## 2019-11-11 ENCOUNTER — Telehealth: Payer: Self-pay

## 2019-11-11 ENCOUNTER — Ambulatory Visit (INDEPENDENT_AMBULATORY_CARE_PROVIDER_SITE_OTHER): Payer: HMO | Admitting: Nurse Practitioner

## 2019-11-11 ENCOUNTER — Other Ambulatory Visit: Payer: Self-pay

## 2019-11-11 ENCOUNTER — Encounter: Payer: Self-pay | Admitting: Nurse Practitioner

## 2019-11-11 DIAGNOSIS — Z0000 Encounter for general adult medical examination without abnormal findings: Secondary | ICD-10-CM | POA: Diagnosis not present

## 2019-11-11 NOTE — Telephone Encounter (Signed)
Miranda [name], Miranda [name] scheduled for a virtual visit with your provider today.    Just as we do with appointments in the office, we must obtain your consent to participate.  Your consent will be active for this visit and any virtual visit you may have with one of our providers in the next 365 days.    If you have a MyChart account, I can also send a copy of this consent to you electronically.  All virtual visits [name]  As this is a virtual visit, video technology does not allow for your provider to perform a traditional examination.  This may limit your provider's ability to fully assess your condition.  If your provider identifies any concerns that need to be evaluated in person or the need to arrange testing such as labs, EKG, etc, we will make arrangements to do so.    Although advances in technology [name], we cannot ensure that it will always work on either your end or our end.  If the connection with a video visit is poor, we may have to switch to a telephone visit.  With either a video or telephone visit, we [name]   I need to obtain your verbal consent now.   [name]   Miranda [name] has provided verbal consent on 11/11/2019 for a virtual visit (video or telephone).   Miranda [name], [place] 11/11/2019  9:05 am

## 2019-11-11 NOTE — Progress Notes (Signed)
Subjective:   Miranda [name] is a 54 y.o. female who presents for Medicare Annual (Subsequent) preventive examination.  Review of Systems:         Objective:     Vitals: LMP 01/17/2015   There is no height or weight on file to calculate BMI.  Advanced Directives 11/11/2019 10/13/2019 03/15/2019 09/08/2018 01/11/2018 11/06/2017 09/14/2017  Does Patient Have a Medical Advance Directive? No No No No No No No  Does patient want to make changes to medical advance directive? - - - - - - -  Would patient like information on creating a medical advance directive? Yes (MAU/Ambulatory/Procedural Areas - Information given) No - Patient declined No - Patient declined No - Patient declined No - Patient declined Yes (MAU/Ambulatory/Procedural Areas - Information given) No - Patient declined  Pre-existing out of facility DNR order (yellow form or pink MOST form) - - - - - - -    Tobacco Social History   Tobacco Use  Smoking Status Former Smoker  . Packs/day: 0.50  . Years: 5.00  . Pack years: 2.50  . Types: Cigars, Cigarettes  . Quit date: 08/04/1978  . Years since quitting: 41.2  Smokeless Tobacco Never Used  Tobacco Comment   1 cigar daily-quit 10/24/12       Counseling given: Not Answered Comment: 1 cigar daily-quit 10/24/12     Clinical Intake:                       Past Medical History:  Diagnosis Date  . Anemia   . Anxiety   . At risk for sudden cardiac death 17-Nov-2012  . CHF - Combined Systolic + Diastolic Dysfunction. EF of 15-20% with Grade II diastolic dysfunction on echo 10/27/12 10/28/2012  . Coronary artery disease   . Depression   . Dyspnea   . Fibroids   . Heart murmur   . Hypertension   . Hypothyroidism   . S/P cardiac catheterization, 11-17-12, normal coronaries with minimal luminal irregularities in RCA system 10/30/2012  . Thyroid disease    hypothyroidism   Past Surgical History:  Procedure Laterality Date  . AORTIC VALVE REPLACEMENT N/A 03/12/2017   Procedure: AORTIC VALVE REPLACEMENT (AVR) with Mitral Annuloplasty;  Surgeon: Gaye Pollack, MD;  Location: [facility] OR;  Service: Open Heart Surgery;  Laterality: N/A;  . LEFT AND RIGHT HEART CATHETERIZATION WITH CORONARY ANGIOGRAM N/A November 17, 2012   Procedure: LEFT AND RIGHT HEART CATHETERIZATION WITH CORONARY ANGIOGRAM;  Surgeon: Leonie Man, MD;  Location: [facility] CATH LAB;  Service: Cardiovascular;  Laterality: N/A;  . MITRAL VALVE REPAIR N/A 03/12/2017   Procedure: MITRAL VALVE REPAIR (MVR);  Surgeon: Gaye Pollack, MD;  Location: [place];  Service: Open Heart Surgery;  Laterality: N/A;  mitral annuloplasty  . MULTIPLE EXTRACTIONS WITH ALVEOLOPLASTY N/A 01/21/2017   Procedure: Extraction of tooth #'s 4-13 with alveoloplasty;  Surgeon: Lenn Cal, DDS;  Location: [place];  Service: Oral Surgery;  Laterality: N/A;  . RIGHT/LEFT HEART CATH AND CORONARY ANGIOGRAPHY N/A 01/09/2017   Procedure: Right/Left Heart Cath and Coronary Angiography;  Surgeon: Troy Sine, MD;  Location: [place] CV LAB;  Service: Cardiovascular;  Laterality: N/A;  . TEE WITHOUT CARDIOVERSION N/A 03/12/2017   Procedure: TRANSESOPHAGEAL ECHOCARDIOGRAM (TEE);  Surgeon: Gaye Pollack, MD;  Location: [place];  Service: Open Heart Surgery;  Laterality: N/A;   Family History  Problem Relation Age of Onset  . Bone cancer Paternal Grandfather   . Hypertension  Mother   . Liver disease Mother        autoimmune  . Heart disease Mother   . Colon polyps Mother   . Stroke Father   . Hypertension Father   . Hypertension Maternal Grandmother   . Heart disease Maternal Grandmother   . Breast cancer Cousin   . Bone cancer Maternal Uncle   . Arthritis Paternal Uncle    Social History   Socioeconomic History  . Marital status: Single    Spouse name: Not on file  . Number of children: 1  . Years of education: 12th  . Highest education level: Not on file  Occupational History  . Occupation: unemployed    Employer: [employer]  Tobacco Use  . Smoking status: Former Smoker    Packs/day: 0.50    Years: 5.00    Pack years: 2.50    Types: Cigars, Cigarettes    Quit date: 08/04/1978    Years since quitting: 41.2  . Smokeless tobacco: Never Used  . Tobacco comment: 1 cigar daily-quit 10/24/12    Substance and Sexual Activity  . Alcohol use: Yes    Alcohol/week: 3.0 standard drinks    Types: 3 Glasses of wine per week    Comment: per month  . Drug use: Not Currently    Frequency: 5.0 times per week    Types: Marijuana    Comment: few weeks ago last  . Sexual activity: Not Currently  Other Topics Concern  . Not on file  Social History Narrative   Patient lives at home alone. (Condo)   Caffeine use: 1/2 soda daily   Diet   Martial status:single   Is it one or more stories? Yes   How many persons live in your home? 1   Do you have any pets in your home? No   Current or past profession: Biochemist, clinical   Do you exercise? No   Do you have a living will? No    Do you have a DNR form? No    Do you have signed POA/HPOA forms? No    Social Determinants of Radio broadcast assistant Strain:   . Difficulty of Paying Living Expenses:   Food Insecurity: No Food Insecurity  . Worried About Charity fundraiser in the Last Year: Never true  . Ran Out of Food in the Last Year: Never true  Transportation Needs: No Transportation Needs  . Lack of Transportation (Medical): No  . Lack of Transportation (Non-Medical): No  Physical Activity:   . Days of Exercise per Week:   . Minutes of Exercise per Session:   Stress:   . Feeling of Stress :   Social Connections:   . Frequency of Communication with Friends and Family:   . Frequency of Social Gatherings with Friends and Family:   . Attends Religious Services:   . Active Member of Clubs or Organizations:   . Attends Archivist Meetings:   Marland Kitchen Marital Status:     Outpatient Encounter Medications as of 11/11/2019  Medication Sig  . aspirin EC  81 MG EC tablet Take 1 tablet (81 mg total) by mouth daily.  Marland Kitchen buPROPion (WELLBUTRIN XL) 150 MG 24 hr tablet Take 1 tablet (150 mg total) by mouth daily.  . carvedilol (COREG) 3.125 MG tablet Take 1 tablet (3.125 mg total) by mouth 2 (two) times daily.  . cholecalciferol (VITAMIN D3) 25 MCG (1000 UNIT) tablet Take 1,000 Units by mouth daily.  Marland Kitchen  diclofenac sodium (VOLTAREN) 1 % GEL Apply 4 g topically as needed.  . ferrous sulfate 325 (65 FE) MG EC tablet Take 1 tablet (325 mg total) by mouth 2 (two) times daily.  . fluticasone (FLONASE) 50 MCG/ACT nasal spray Place 1 spray into both nostrils daily as needed for allergies or rhinitis.  . furosemide (LASIX) 40 MG tablet Take 40 mg by mouth daily.  Marland Kitchen levothyroxine (SYNTHROID) 100 MCG tablet Take 1 tablet (100 mcg total) by mouth daily before breakfast.  . mirtazapine (REMERON) 15 MG tablet Take 1 tablet (15 mg total) by mouth at bedtime.  . rosuvastatin (CRESTOR) 20 MG tablet Take 20 mg by mouth daily.  . traZODone (DESYREL) 150 MG tablet Take 1 tablet (150 mg total) by mouth at bedtime.  Marland Kitchen lisinopril (ZESTRIL) 5 MG tablet Take 1 tablet (5 mg total) by mouth at bedtime.  . [DISCONTINUED] furosemide (LASIX) 40 MG tablet TAKE ONE TABLET BY MOUTH ONCE DAILY, EXCEPT TAKE ONE AND ONE-HALF TABLETS ON MONDAYS AND THURSDAYS  . [DISCONTINUED] Multiple Vitamins-Minerals (CENTRUM ADULTS PO) Take by mouth daily.  . [DISCONTINUED] rosuvastatin (CRESTOR) 10 MG tablet Take 2 tablets (20 mg total) by mouth daily.   No facility-administered encounter medications on file as of 11/11/2019.    Activities of Daily Living In your present state of health, do you have any difficulty performing the following activities: 07/18/2019  Hearing? N  Vision? N  Difficulty concentrating or making decisions? N  Comment forgetful at times  Walking or climbing stairs? N  Dressing or bathing? N  Doing errands, shopping? N  Preparing Food and eating ? N  Using the Toilet? N  In  the past six months, have you accidently leaked urine? N  Do you have problems with loss of bowel control? N  Managing your Medications? N  Managing your Finances? N  Housekeeping or managing your Housekeeping? N  Some recent data might be hidden    Patient Care Team: Gayland Curry, DO as PCP - General (Geriatric Medicine) Jerline Pain, MD as PCP - Cardiology (Cardiology) Emily Filbert, MD as Consulting Physician (Obstetrics and Gynecology) Bensimhon, Shaune Pascal, MD as Consulting Physician (Cardiology) [facility], Earlean Polka, MD as Consulting Physician (Neurology) Dimitri Ped, RN as [facility]    Assessment:   This is a routine wellness examination for Monaye.  Exercise Activities and Dietary recommendations    Goals    . Client will report no fall or injuries in the next 6 months.(continue 03/15/19)     Reports one fall with no injury    . Client will verbalize knowledge of self management of Hypertension as evidences by BP reading of 140/90 or less; or as defined by provider     Reports B/P less than 140/90 115/70 last result at home    . maintain lifestyle     Starting 11/03/2016 I would like to maintain my lifestyle.    . Maintain timely refills of Heart Failure medication as prescribed within the year      Maintaining refills on time     . Record weight daily for the next 6 months(continue 03/15/19) (pt-stated)     Try to write down daily weights on [employer]) calendar       Fall Risk Fall Risk  11/11/2019 10/13/2019 02/07/2019 11/09/2018 09/27/2018  Falls in the past year? 0 - 0 1 1  Number falls in past yr: 0 0 0 1 0  Injury with  Fall? 0 0 0 0 0   Is the patient's home free of loose throw rugs in walkways, pet beds, electrical cords, etc?   yes      Grab bars in the bathroom? no      Handrails on the stairs?   yes      Adequate lighting?   yes  Timed Get Up and Go performed: na  Depression Screen PHQ 2/9 Scores  11/11/2019 07/18/2019 03/15/2019 02/07/2019  PHQ - 2 Score 0 0 0 0     Cognitive Function MMSE - Mini Mental State Exam 11/03/2016  Not completed: Unable to complete     6CIT Screen 11/11/2019 11/09/2018  What Year? 0 points 0 points  What month? 0 points 0 points  What time? 0 points 0 points  Count back from 20 0 points 0 points  Months in reverse 0 points 0 points  Repeat phrase 0 points 4 points  Total Score 0 4    Immunization History  Administered Date(s) Administered  . Hep A / Hep B 09/08/2017, 10/09/2017, 03/11/2018  . Influenza, High Dose Seasonal PF 05/24/2018  . Influenza,inj,Quad PF,6+ Mos 05/26/2016, 05/07/2017, 06/13/2019  . Pneumococcal Conjugate-13 12/14/2014  . Pneumococcal Polysaccharide-23 01/30/2017  . Tdap 01/25/2016  . Zoster Recombinat (Shingrix) 11/06/2017, 01/28/2018    Qualifies for Shingles Vaccine?yes  Screening Tests Health Maintenance  Topic Date Due  . INFLUENZA VACCINE  03/04/2020  . MAMMOGRAM  09/01/2021  . PAP SMEAR-Modifier  06/12/2022  . Fecal DNA (Cologuard)  09/14/2022  . TETANUS/TDAP  01/24/2026  . Hepatitis C Screening  Completed  . HIV Screening  Completed    Cancer Screenings: Lung: Low Dose CT Chest recommended if Age 41-80 years, 30 pack-year currently smoking OR have quit w/in 15years. Patient does not qualify. Breast:  Up to date on Mammogram? Yes   Up to date of Bone Density/Dexa? na Colorectal: up to date  Additional Screenings:  Hepatitis C Screening: completed     Plan:      I have personally reviewed and noted the following in the patient's chart:   . Medical and social history . Use of alcohol, tobacco or illicit drugs  . Current medications and supplements . Functional ability and status . Nutritional status . Physical activity . Advanced directives . List of other physicians . Hospitalizations, surgeries, and ER visits in previous 12 months . Vitals . Screenings to include cognitive, depression, and  falls . Referrals and appointments  In addition, I have reviewed and discussed with patient certain preventive protocols, quality metrics, and best practice recommendations. A written personalized care plan for preventive services as well as general preventive health recommendations were provided to patient.     Lauree Chandler, NP  11/11/2019

## 2019-11-11 NOTE — Patient Instructions (Signed)
Miranda [name] , Thank you for taking time to come for your Medicare Wellness Visit. I appreciate your ongoing commitment to your health goals. Please review the following plan we discussed and let me know if I can assist you in the future.   Screening recommendations/referrals: Colonoscopy up to date Mammogram up to date Bone Density -start screening at 40 Recommended yearly ophthalmology/optometry visit for glaucoma screening and checkup Recommended yearly dental visit for hygiene and checkup  Vaccinations: Influenza vaccine up to date Pneumococcal vaccine up to date Tdap vaccine up to date Shingles vaccine up to date    Advanced directives: recommend you complete and bring back to office to place on file.   Conditions/risks identified: cardiovascular disease.   Next appointment: 1 year   Preventive Care 27 Years and Older, Female Preventive care refers to lifestyle choices and visits with your health care provider that can promote health and wellness. What does preventive care include?  A yearly physical exam. This is also called an annual well check.  Dental exams once or twice a year.  Routine eye exams. Ask your health care provider how often you should have your eyes checked.  Personal lifestyle choices, including:  Daily care of your teeth and gums.  Regular physical activity.  Eating a healthy diet.  Avoiding tobacco and drug use.  Limiting alcohol use.  Practicing safe sex.  Taking low-dose aspirin every day.  Taking vitamin and mineral supplements as recommended by your health care provider. What happens during an annual well check? The services and screenings done by your health care provider during your annual well check will depend on your age, overall health, lifestyle risk factors, and family history of disease. Counseling  Your health care provider may ask you questions about your:  Alcohol use.  Tobacco use.  Drug use.  Emotional  well-being.  Home and relationship well-being.  Sexual activity.  Eating habits.  History of falls.  Memory and ability to understand (cognition).  Work and work Statistician.  Reproductive health. Screening  You may have the following tests or measurements:  Height, weight, and BMI.  Blood pressure.  Lipid and cholesterol levels. These may be checked every 5 years, or more frequently if you are over 61 years old.  Skin check.  Lung cancer screening. You may have this screening every year starting at age 66 if you have a 30-pack-year history of smoking and currently smoke or have quit within the past 15 years.  Fecal occult blood test (FOBT) of the stool. You may have this test every year starting at age 56.  Flexible sigmoidoscopy or colonoscopy. You may have a sigmoidoscopy every 5 years or a colonoscopy every 10 years starting at age 60.  Hepatitis C blood test.  Hepatitis B blood test.  Sexually transmitted disease (STD) testing.  Diabetes screening. This is done by checking your blood sugar (glucose) after you have not eaten for a while (fasting). You may have this done every 1-3 years.  Bone density scan. This is done to screen for osteoporosis. You may have this done starting at age 60.  Mammogram. This may be done every 1-2 years. Talk to your health care provider about how often you should have regular mammograms. Talk with your health care provider about your test results, treatment options, and if necessary, the need for more tests. Vaccines  Your health care provider may recommend certain vaccines, such as:  Influenza vaccine. This is recommended every year.  Tetanus, diphtheria, and acellular  pertussis (Tdap, Td) vaccine. You may need a Td booster every 10 years.  Zoster vaccine. You may need this after age 18.  Pneumococcal 13-valent conjugate (PCV13) vaccine. One dose is recommended after age 31.  Pneumococcal polysaccharide (PPSV23) vaccine. One  dose is recommended after age 58. Talk to your health care provider about which screenings and vaccines you need and how often you need them. This information is not intended to replace advice given to you by your health care provider. Make sure you discuss any questions you have with your health care provider. Document Released: 08/17/2015 Document Revised: 04/09/2016 Document Reviewed: 05/22/2015 Elsevier Interactive Patient Education  2017 [place] Prevention in the Home Falls can cause injuries. They can happen to people of all ages. There are many things you can do to make your home safe and to help prevent falls. What can I do on the outside of my home?  Regularly fix the edges of walkways and driveways and fix any cracks.  Remove anything that might make you trip as you walk through a door, such as a raised step or threshold.  Trim any bushes or trees on the path to your home.  Use bright outdoor lighting.  Clear any walking paths of anything that might make someone trip, such as rocks or tools.  Regularly check to see if handrails are loose or broken. Make sure that both sides of any steps have handrails.  Any raised decks and porches should have guardrails on the edges.  Have any leaves, snow, or ice cleared regularly.  Use sand or salt on walking paths during winter.  Clean up any spills in your garage right away. This includes oil or grease spills. What can I do in the bathroom?  Use night lights.  Install grab bars by the toilet and in the tub and shower. Do not use towel bars as grab bars.  Use non-skid mats or decals in the tub or shower.  If you need to sit down in the shower, use a plastic, non-slip stool.  Keep the floor dry. Clean up any water that spills on the floor as soon as it happens.  Remove soap buildup in the tub or shower regularly.  Attach bath mats securely with double-sided non-slip rug tape.  Do not have throw rugs and other  things on the floor that can make you trip. What can I do in the bedroom?  Use night lights.  Make sure that you have a light by your bed that is easy to reach.  Do not use any sheets or blankets that are too big for your bed. They should not hang down onto the floor.  Have a firm chair that has side arms. You can use this for support while you get dressed.  Do not have throw rugs and other things on the floor that can make you trip. What can I do in the kitchen?  Clean up any spills right away.  Avoid walking on wet floors.  Keep items that you use a lot in easy-to-reach places.  If you need to reach something above you, use a strong step stool that has a grab bar.  Keep electrical cords out of the way.  Do not use floor polish or wax that makes floors slippery. If you must use wax, use non-skid floor wax.  Do not have throw rugs and other things on the floor that can make you trip. What can I do with my stairs?  Do not leave any items on the stairs.  Make sure that there are handrails on both sides of the stairs and use them. Fix handrails that are broken or loose. Make sure that handrails are as long as the stairways.  Check any carpeting to make sure that it is firmly attached to the stairs. Fix any carpet that is loose or worn.  Avoid having throw rugs at the top or bottom of the stairs. If you do have throw rugs, attach them to the floor with carpet tape.  Make sure that you have a light switch at the top of the stairs and the bottom of the stairs. If you do not have them, ask someone to add them for you. What else can I do to help prevent falls?  Wear shoes that:  Do not have high heels.  Have rubber bottoms.  Are comfortable and fit you well.  Are closed at the toe. Do not wear sandals.  If you use a stepladder:  Make sure that it is fully opened. Do not climb a closed stepladder.  Make sure that both sides of the stepladder are locked into place.  Ask  someone to hold it for you, if possible.  Clearly mark and make sure that you can see:  Any grab bars or handrails.  First and last steps.  Where the edge of each step is.  Use tools that help you move around (mobility aids) if they are needed. These include:  Canes.  Walkers.  Scooters.  Crutches.  Turn on the lights when you go into a dark area. Replace any light bulbs as soon as they burn out.  Set up your furniture so you have a clear path. Avoid moving your furniture around.  If any of your floors are uneven, fix them.  If there are any pets around you, be aware of where they are.  Review your medicines with your doctor. Some medicines can make you feel dizzy. This can increase your chance of falling. Ask your doctor what other things that you can do to help prevent falls. This information is not intended to replace advice given to you by your health care provider. Make sure you discuss any questions you have with your health care provider. Document Released: 05/17/2009 Document Revised: 12/27/2015 Document Reviewed: 08/25/2014 Elsevier Interactive Patient Education  2017 [employer].

## 2019-11-11 NOTE — Progress Notes (Signed)
   This service is provided via telemedicine  No vital signs collected/recorded due to the encounter was a telemedicine visit.   Location of patient (ex: home, work):  Home  Patient consents to a telephone visit:  Yes, see annual consent via telephone encounter dated 11/11/2019  Location of the provider (ex: office, home):  [facility], Office   Name of any referring provider: Gayland Curry, DO   Names of all persons participating in the telemedicine service and their role in the encounter:  S.Chrae B/CMA, Sherrie Mustache, NP, and Patient   Time spent on call:  9 min with medical assistant

## 2019-11-22 ENCOUNTER — Other Ambulatory Visit: Payer: Self-pay

## 2019-11-22 DIAGNOSIS — I952 Hypotension due to drugs: Secondary | ICD-10-CM

## 2019-11-22 MED ORDER — LISINOPRIL 5 MG PO TABS: 5.0000 mg | ORAL_TABLET | Freq: Every day | ORAL | 3 refills | Status: DC

## 2019-11-22 NOTE — Telephone Encounter (Signed)
Pt's medication was sent to pt's pharmacy as requested. Confirmation received.  °

## 2019-11-22 NOTE — Patient Outreach (Signed)
[facility]) Care Management Chronic Special Needs Program  11/22/2019  Name: Miranda [name] DOB: Dec 24, 1965  MRN: [id number]  [name] is enrolled in a chronic special needs plan for Heart Failure. Chronic Care Management Coordinator telephoned client to review health risk assessment and to develop individualized care plan.  Reviewed the chronic care management program, importance of client participation, and taking their care plan to all provider appointments and inpatient facilities.  Reviewed the transition of care process and possible referral to community care management.  Subjective: Client states that she has been feeling good.  States she is walking about every day for at least 30 minutes and she uses her pedaling machine on bad weather days.  States she is still fishing when she can.  Denies any falls.  States she is weighting every 2-3 days and her weight has been stable at 140.  States she knows when to call her doctor for her heart.  Denies any chest pains or shortness of breath States she is trying to follow a low fat low salt diet.  States she is taking the increased Crestor without any problems.  States her B/P has been good since her lisinopril was increased to 5 mg.  States she checks her B/P at home 2-3 times a week and her last reading was 125/82. Denies any issues affording or getting her medications.  States she has had both of her COVID shots.  States she does not want to complete her Advanced Directives and she has the forms.  Goals Addressed            This Visit's Progress   . Client will report no fall or injuries in the next 12 months.(continue 11/22/19)   On track    Reports no falls Educated on fall precautions and home safety Stand up slowly Use grabber to pick up objects on the floor Keep home well-lit and wear your glasses  Sent EMMI- Preventing falls    . Client will report no worsening of symptoms related to heart failure within  the next 12 months   On track    Reports no symptoms of heart failure Signs and symptoms (heart failure zones)of heart failure reviewed Advised to notify doctor for symptoms Call 911 for severe symptoms Weigh daily and record weighs Follow a low salt diet     . Client will verbalize knowledge of self management of Hypertension as evidences by BP reading of 140/90 or less; or as defined by provider   On track    Reports B/P less than 140/90 125/82 last result at home Plan to check B/P regularly Take B/P medications as ordered Plan to follow a low salt diet  Increase activity as tolerated Sent EMMI: High Blood Pressure (Hypertension) What you can do     . maintain lifestyle (pt-stated)   On track    Starting 11/03/2016 I would like to maintain my lifestyle. Encouraged to continue to fish and remain active    . Maintain timely refills of Heart Failure medication as prescribed within the year    On track    Maintaining timely refills of medications per dispense report Reinforced importance of getting medications refilled on time    . Obtain annual  Lipid Profile, LDL-C   On track    Completed 10/13/19 LDL 103 The goal for LDL is less than 70 mg/dL as you are at high risk for complications Try to avoid saturated fats, trans-fats and eat more fiber Plan to  take statin as ordered    . Patient Stated   On track     11/11/19 To stay out of the hospital     . Record weight daily for the next 12 months(continue 11/22/19) (pt-stated)   Not on track    Reinforced to try to write down daily weights on [employer] calendar Reviewed importance of daily weights    . Visit Primary Care Provider or Cardiologist at least 2 times per year   On track    Primary care provider on 10/13/19 Annual Wellness Visit- 11/11/19 Cardiologist 10/21/19 Reinforced to keep scheduled appointments with providers     Client has good teach back on heart failure zones and when to call MD Reports taking  increased dose of Crestor with no issues Reports improved B/P with increased dose of Lisinopril Plan to move to Tier 1 as client is self managing her HF and HTN   Plan:  Send successful outreach letter with a copy of their individualized care plan, Send individual care plan to provider and Send educational material-EMMI-High Blood Pressure(Hypertension): What you can do. Preventing falls  Chronic care management coordination will outreach in:  12 months     [place], Jackquline Denmark, [place] Management [phone]

## 2020-01-05 ENCOUNTER — Other Ambulatory Visit: Payer: Self-pay | Admitting: Cardiology

## 2020-01-05 ENCOUNTER — Other Ambulatory Visit: Payer: Self-pay | Admitting: Internal Medicine

## 2020-02-13 ENCOUNTER — Encounter: Payer: Self-pay | Admitting: Internal Medicine

## 2020-02-13 ENCOUNTER — Other Ambulatory Visit: Payer: Self-pay

## 2020-02-13 ENCOUNTER — Ambulatory Visit (INDEPENDENT_AMBULATORY_CARE_PROVIDER_SITE_OTHER): Payer: HMO | Admitting: Internal Medicine

## 2020-02-13 VITALS — BP 138/82 | HR 68 | Temp 96.9°F | Ht 65.0 in | Wt 140.4 lb

## 2020-02-13 DIAGNOSIS — F3342 Major depressive disorder, recurrent, in full remission: Secondary | ICD-10-CM

## 2020-02-13 DIAGNOSIS — F5104 Psychophysiologic insomnia: Secondary | ICD-10-CM

## 2020-02-13 DIAGNOSIS — E039 Hypothyroidism, unspecified: Secondary | ICD-10-CM | POA: Diagnosis not present

## 2020-02-13 DIAGNOSIS — E782 Mixed hyperlipidemia: Secondary | ICD-10-CM | POA: Diagnosis not present

## 2020-02-13 DIAGNOSIS — I5022 Chronic systolic (congestive) heart failure: Secondary | ICD-10-CM | POA: Diagnosis not present

## 2020-02-13 LAB — CBC WITH DIFFERENTIAL/PLATELET
Absolute Monocytes: 374 cells/uL (ref 200–950)
Basophils Absolute: 11 cells/uL (ref 0–200)
Basophils Relative: 0.3 %
Eosinophils Absolute: 11 cells/uL — ABNORMAL LOW (ref 15–500)
Eosinophils Relative: 0.3 %
HCT: 37.6 % (ref 35.0–45.0)
Hemoglobin: 12.1 g/dL (ref 11.7–15.5)
Lymphs Abs: 1217 cells/uL (ref 850–3900)
MCH: 27.6 pg (ref 27.0–33.0)
MCHC: 32.2 g/dL (ref 32.0–36.0)
MCV: 85.6 fL (ref 80.0–100.0)
MPV: 12 fL (ref 7.5–12.5)
Monocytes Relative: 10.4 %
Neutro Abs: 1987 cells/uL (ref 1500–7800)
Neutrophils Relative %: 55.2 %
Platelets: 146 10*3/uL (ref 140–400)
RBC: 4.39 10*6/uL (ref 3.80–5.10)
RDW: 13.6 % (ref 11.0–15.0)
Total Lymphocyte: 33.8 %
WBC: 3.6 10*3/uL — ABNORMAL LOW (ref 3.8–10.8)

## 2020-02-13 LAB — COMPLETE METABOLIC PANEL WITH GFR
AG Ratio: 1 (calc) (ref 1.0–2.5)
ALT: 14 U/L (ref 6–29)
AST: 25 U/L (ref 10–35)
Albumin: 4 g/dL (ref 3.6–5.1)
Alkaline phosphatase (APISO): 69 U/L (ref 37–153)
BUN: 13 mg/dL (ref 7–25)
CO2: 26 mmol/L (ref 20–32)
Calcium: 9.4 mg/dL (ref 8.6–10.4)
Chloride: 109 mmol/L (ref 98–110)
Creat: 0.91 mg/dL (ref 0.50–1.05)
GFR, Est African American: 82 mL/min/{1.73_m2} (ref >=60)
GFR, Est Non African American: 71 mL/min/{1.73_m2} (ref >=60)
Globulin: 4 g/dL (calc) — ABNORMAL HIGH (ref 1.9–3.7)
Glucose, Bld: 91 mg/dL (ref 65–99)
Potassium: 3.6 mmol/L (ref 3.5–5.3)
Sodium: 140 mmol/L (ref 135–146)
Total Bilirubin: 0.3 mg/dL (ref 0.2–1.2)
Total Protein: 8 g/dL (ref 6.1–8.1)

## 2020-02-13 LAB — LIPID PANEL
Cholesterol: 139 mg/dL (ref <200)
HDL: 51 mg/dL (ref >=50)
LDL Cholesterol (Calc): 72 mg/dL (calc)
Non-HDL Cholesterol (Calc): 88 mg/dL (calc) (ref <130)
Total CHOL/HDL Ratio: 2.7 (calc) (ref <5.0)
Triglycerides: 82 mg/dL (ref <150)

## 2020-02-13 LAB — TSH: TSH: 5.62 mIU/L — ABNORMAL HIGH (ref 0.40–4.50)

## 2020-02-13 MED ORDER — ZYPITAMAG 4 MG PO TABS: 4.0000 mg | ORAL_TABLET | Freq: Every day | ORAL | 3 refills | Status: DC

## 2020-02-13 NOTE — Progress Notes (Signed)
Labs overall ok.  Cholesterol had improved to just 2 points above goal with that crestor 20mg  daily.  We'll see how she does on the new statin that is covered by her insurance now.

## 2020-02-13 NOTE — Progress Notes (Signed)
Location:  [facility] clinic Provider:  Abhimanyu Cruces L. Mariea Clonts, D.O., C.M.D.  Code Status: full code Goals of Care:  Advanced Directives 11/22/2019  Does Patient Have a Medical Advance Directive? No  Does patient want to make changes to medical advance directive? -  Would patient like information on creating a medical advance directive? No - Patient declined  Pre-existing out of facility DNR order (yellow form or pink MOST form) -     Chief Complaint  Patient presents with  . Medical Management of Chronic Issues    4 month follow up     HPI: Patient is a 54 y.o. female seen today for medical management of chronic diseases.    Nothing new.  Everything is about the same.  Hot flashes are still bad.  Otherwise doing pretty good.    She got a new card for her mitral valve replacement from the [facility] [employer].    No major chest pains.  Might feel a little something.  Not frequent.  Only sob if climbing stairs or going up hills.  No palpitations.    Mood has been pretty good.  No difficulty sleeping.  No hallucinations.  Taking her wellbutrin like she is supposed to and notes a big difference.    3/18 and 4/8 were pfizer covid vaccines.   Past Medical History:  Diagnosis Date  . Anemia   . Anxiety   . At risk for sudden cardiac death 2012/11/05  . CHF - Combined Systolic + Diastolic Dysfunction. EF of 15-20% with Grade II diastolic dysfunction on echo 10/27/12 10/28/2012  . Coronary artery disease   . Depression   . Dyspnea   . Fibroids   . Heart murmur   . Hypertension   . Hypothyroidism   . S/P cardiac catheterization, 2012-11-05, normal coronaries with minimal luminal irregularities in RCA system 10/30/2012  . Thyroid disease    hypothyroidism    Past Surgical History:  Procedure Laterality Date  . AORTIC VALVE REPLACEMENT N/A 03/12/2017   Procedure: AORTIC VALVE REPLACEMENT (AVR) with Mitral Annuloplasty;  Surgeon: Gaye Pollack, MD;  Location: [facility] OR;  Service: Open Heart Surgery;   Laterality: N/A;  . LEFT AND RIGHT HEART CATHETERIZATION WITH CORONARY ANGIOGRAM N/A 05-Nov-2012   Procedure: LEFT AND RIGHT HEART CATHETERIZATION WITH CORONARY ANGIOGRAM;  Surgeon: Leonie Man, MD;  Location: [facility] CATH LAB;  Service: Cardiovascular;  Laterality: N/A;  . MITRAL VALVE REPAIR N/A 03/12/2017   Procedure: MITRAL VALVE REPAIR (MVR);  Surgeon: Gaye Pollack, MD;  Location: [place];  Service: Open Heart Surgery;  Laterality: N/A;  mitral annuloplasty  . MULTIPLE EXTRACTIONS WITH ALVEOLOPLASTY N/A 01/21/2017   Procedure: Extraction of tooth #'s 4-13 with alveoloplasty;  Surgeon: Lenn Cal, DDS;  Location: [place];  Service: Oral Surgery;  Laterality: N/A;  . RIGHT/LEFT HEART CATH AND CORONARY ANGIOGRAPHY N/A 01/09/2017   Procedure: Right/Left Heart Cath and Coronary Angiography;  Surgeon: Troy Sine, MD;  Location: [place] CV LAB;  Service: Cardiovascular;  Laterality: N/A;  . TEE WITHOUT CARDIOVERSION N/A 03/12/2017   Procedure: TRANSESOPHAGEAL ECHOCARDIOGRAM (TEE);  Surgeon: Gaye Pollack, MD;  Location: [place];  Service: Open Heart Surgery;  Laterality: N/A;    Allergies  Allergen Reactions  . Codeine Hives    Outpatient Encounter Medications as of 02/13/2020  Medication Sig  . aspirin EC 81 MG EC tablet Take 1 tablet (81 mg total) by mouth daily.  Marland Kitchen buPROPion (WELLBUTRIN XL) 150 MG 24 hr tablet Take  1 tablet (150 mg total) by mouth daily.  . carvedilol (COREG) 3.125 MG tablet Take 1 tablet (3.125 mg total) by mouth 2 (two) times daily.  . cholecalciferol (VITAMIN D3) 25 MCG (1000 UNIT) tablet Take 1,000 Units by mouth daily.  . diclofenac sodium (VOLTAREN) 1 % GEL Apply 4 g topically as needed.  . ferrous sulfate 325 (65 FE) MG EC tablet Take 1 tablet (325 mg total) by mouth 2 (two) times daily.  . fluticasone (FLONASE) 50 MCG/ACT nasal spray USE 1 SPRAY(S) IN EACH NOSTRIL ONCE DAILY AS NEEDED FOR ALLERGIES OR RHINITIS  . furosemide (LASIX) 40 MG tablet TAKE 1 TABLET  BY MOUTH ONCE DAILY EXCEPT  TAKE  ONE  AND  ONE-HALF  TABLETS  ON  MONDAYS  AND  THURSDAYS  . levothyroxine (SYNTHROID) 100 MCG tablet Take 1 tablet (100 mcg total) by mouth daily before breakfast.  . lisinopril (ZESTRIL) 5 MG tablet Take 1 tablet (5 mg total) by mouth at bedtime.  . mirtazapine (REMERON) 15 MG tablet Take 1 tablet (15 mg total) by mouth at bedtime.  . rosuvastatin (CRESTOR) 20 MG tablet Take 20 mg by mouth daily.  . traZODone (DESYREL) 150 MG tablet Take 1 tablet (150 mg total) by mouth at bedtime.   No facility-administered encounter medications on file as of 02/13/2020.    Review of Systems:  Review of Systems  Constitutional: Negative for chills, fever and malaise/fatigue.       Hot flashes  HENT: Negative for congestion, hearing loss and sore throat.   Eyes: Positive for double vision. Negative for blurred vision.  Respiratory: Positive for shortness of breath. Negative for cough.   Cardiovascular: Negative for chest pain, palpitations and leg swelling.  Gastrointestinal: Negative for abdominal pain, constipation and diarrhea.  Genitourinary: Negative for dysuria.  Musculoskeletal: Negative for falls and joint pain.  Skin: Negative for itching and rash.  Neurological: Negative for dizziness and loss of consciousness.  Endo/Heme/Allergies: Bruises/bleeds easily.  Psychiatric/Behavioral: Negative for depression and memory loss. The patient is not nervous/anxious and does not have insomnia.     Health Maintenance  Topic Date Due  . COVID-19 Vaccine (1) Never done  . INFLUENZA VACCINE  03/04/2020  . MAMMOGRAM  09/01/2021  . PAP SMEAR-Modifier  06/12/2022  . Fecal DNA (Cologuard)  09/14/2022  . TETANUS/TDAP  01/24/2026  . Hepatitis C Screening  Completed  . HIV Screening  Completed    Physical Exam: Vitals:   02/13/20 0930  BP: 138/82  Pulse: 68  Temp: (!) 96.9 F (36.1 C)  TempSrc: Temporal  SpO2: 98%  Weight: 140 lb 6.4 oz (63.7 kg)  Height: 5\' 5"   (1.651 m)   Body mass index is 23.36 kg/m. Physical Exam Vitals reviewed.  Constitutional:      Appearance: Normal appearance.  HENT:     Head: Normocephalic and atraumatic.  Eyes:     Pupils: Pupils are equal, round, and reactive to light.  Cardiovascular:     Rate and Rhythm: Normal rate and regular rhythm.     Pulses: Normal pulses.     Heart sounds: Murmur heard.   Pulmonary:     Effort: Pulmonary effort is normal.     Breath sounds: Normal breath sounds.  Musculoskeletal:        General: Normal range of motion.     Right lower leg: No edema.     Left lower leg: No edema.  Skin:    General: Skin is warm and dry.  Neurological:     General: No focal deficit present.     Mental Status: She is alert and oriented to person, place, and time.  Psychiatric:        Mood and Affect: Mood normal.        Behavior: Behavior normal.        Thought Content: Thought content normal.        Judgment: Judgment normal.     Labs reviewed: Basic Metabolic Panel: Recent Labs    05/18/19 0903 06/08/19 0853  NA 141 141  K 3.8 4.3  CL 104 105  CO2 24 28  GLUCOSE 92 91  BUN 25* 17  CREATININE 1.05* 0.99  CALCIUM 9.7 9.9  TSH  --  2.40   Liver Function Tests: Recent Labs    05/18/19 0903 06/08/19 0853  AST 19 20  ALT 11 9  ALKPHOS 70  --   BILITOT <0.2 0.3  PROT 8.1 8.4*  ALBUMIN 4.2  --    No results for input(s): LIPASE, AMYLASE in the last 8760 hours. No results for input(s): AMMONIA in the last 8760 hours. CBC: Recent Labs    05/18/19 0903 06/08/19 0853  WBC 5.3 5.0  NEUTROABS  --  3,005  HGB 12.1 12.6  HCT 38.3 39.9  MCV 88 88.1  PLT 153 160   Lipid Panel: Recent Labs    05/18/19 0903 06/08/19 0853 10/13/19 1032  CHOL 181 198 175  HDL 56 67 58  LDLCALC 108* 117* 103*  TRIG 93 57 56  CHOLHDL 3.2 3.0 3.0   Lab Results  Component Value Date   HGBA1C 5.4 06/08/2019     Assessment/Plan 1. Hypothyroidism, unspecified type - due for recheck of  tsh - cont current levothyroxine pending lab - COMPLETE METABOLIC PANEL WITH GFR - TSH - TSH; Future - CBC with Differential/Platelet; Future  2. Mixed hyperlipidemia -unfortunately, insurance is not covering crestor 20mg --I had just increased her dose at her last labs in march so we'll see if this got her to goal - Lipid panel - Lipid panel; Future recheck in 6 mos after being on new statin that insurance covers at 4mg  - CBC with Differential/Platelet; Future - COMPLETE METABOLIC PANEL WITH GFR; Future  3. Chronic systolic congestive heart failure (HCC) -appears euvolemic--cont same regimen per cardiology - CBC with Differential/Platelet - COMPLETE METABOLIC PANEL WITH GFR - CBC with Differential/Platelet; Future - COMPLETE METABOLIC PANEL WITH GFR; Future  4. Recurrent major depressive disorder, in full remission ([place]) - in remission on wellbutrin with regular use - CBC with Differential/Platelet; Future  5. Psychophysiological insomnia -sleeps well lately with remeron - CBC with Differential/Platelet; Future  Labs/tests ordered:   Lab Orders     CBC with Differential/Platelet     COMPLETE METABOLIC PANEL WITH GFR     TSH     Lipid panel     Lipid panel     TSH     CBC with Differential/Platelet     COMPLETE METABOLIC PANEL WITH GFR  Next appt:  6 mos CPE, fasting labs before  Chisum Habenicht L. Ciani Rutten, D.O. [facility] [address], [place] [address] Cell Phone (Mon-Fri 8am-5pm):  [phone] On Call:  [phone] & follow prompts after 5pm & weekends Office Phone:  [phone] Office Fax:  [phone]

## 2020-02-14 ENCOUNTER — Other Ambulatory Visit: Payer: Self-pay

## 2020-02-14 NOTE — Progress Notes (Signed)
TSH is mildly high.  This could be due to a few missed doses recently or if she needs a higher dose of levothyroxine.  Can you check if she's missed any?  I know she reported being faithful with her wellbutrin at her visit yesterday.  I'm thinking we'll repeat the tsh in 6 weeks either way at this point b/c it's only mildly elevated so please put that in and get her a lab appt.

## 2020-02-15 ENCOUNTER — Telehealth: Payer: Self-pay

## 2020-02-15 DIAGNOSIS — E782 Mixed hyperlipidemia: Secondary | ICD-10-CM

## 2020-02-15 MED ORDER — ROSUVASTATIN CALCIUM 40 MG PO TABS: 40.0000 mg | ORAL_TABLET | Freq: Every day | ORAL | 3 refills | Status: DC

## 2020-02-15 NOTE — Telephone Encounter (Signed)
Actually she does not need the 6 wk lab--order and appt for then can be canceled.

## 2020-02-15 NOTE — Addendum Note (Signed)
Addended by: Logan Bores on: 02/15/2020 02:41 PM   Modules accepted: Orders

## 2020-02-15 NOTE — Telephone Encounter (Signed)
Rx sent for crestor instead.  Note that when I put in the order, I still got the message saying that the insurance prefers zypitamag over the crestor.  We'll see what happens at the pharmacy.

## 2020-02-15 NOTE — Telephone Encounter (Signed)
Incoming call received from patient stating the Zypitamag that Dr.Reed prescribed on 02/13/2020 has a 49 dollar co-pay and she did not pick-up.   Patient called her insurance company and they will cover the generic crestor (not brand name). Patient is requesting rx to be sent to [facility] for rosuvastatin  Please advise

## 2020-02-15 NOTE — Telephone Encounter (Signed)
Noted,   Spoke with patient. Patient asked if she still needs labs rechecked in 6 weeks since she is going to continue with rosuvastatin vs new medication

## 2020-02-15 NOTE — Telephone Encounter (Signed)
Spoke with patient, patient verbalized understanding of Dr.Reed's response. Patient had not scheduled appointments yet and is glad to know she will not need to schedule them.

## 2020-02-17 ENCOUNTER — Telehealth: Payer: Self-pay

## 2020-02-17 NOTE — Telephone Encounter (Signed)
Patient called about her Rosuvastatin (Crestor) 40 mg. She inquired about the dosage amount she stated that she had been taking 20 mg and was not told about the increase. She was verifying the the new dose is correct. I verified it with her per your last note. Ne response needed.

## 2020-04-06 ENCOUNTER — Other Ambulatory Visit: Payer: Self-pay | Admitting: Internal Medicine

## 2020-04-06 DIAGNOSIS — E039 Hypothyroidism, unspecified: Secondary | ICD-10-CM

## 2020-04-06 DIAGNOSIS — F5104 Psychophysiologic insomnia: Secondary | ICD-10-CM

## 2020-04-06 MED ORDER — LEVOTHYROXINE SODIUM 100 MCG PO TABS: 100.0000 ug | ORAL_TABLET | Freq: Every day | ORAL | 0 refills | Status: DC

## 2020-04-06 NOTE — Telephone Encounter (Signed)
I called patient in reference to her refill request for thyroid medication.   According to labs completed on 02/13/2020 patient was to recheck TSH in 6 weeks   I see documentation where Charlene (Dr.Reed's medical assistant) called patient x 1 and left message. No additional documentation was noted that confirmed if patient had missed doses or that she was scheduled for a 6 week follow-up as instructed by Gayland Curry, DO  Left message on voicemail for patient to return call when available and to schedule a non fasting lab appointment for TSH recheck next week.  Medication approved for # 30 only, for patient needs to have TSH recheck prior to a 90 day supply

## 2020-04-12 NOTE — Telephone Encounter (Signed)
Left message on voicemail for patient to return call when available    Reason for call: patient needs to schedule an appointment for labs to check TSH

## 2020-04-19 ENCOUNTER — Other Ambulatory Visit: Payer: HMO

## 2020-04-19 ENCOUNTER — Ambulatory Visit (INDEPENDENT_AMBULATORY_CARE_PROVIDER_SITE_OTHER): Payer: HMO

## 2020-04-19 ENCOUNTER — Other Ambulatory Visit: Payer: Self-pay

## 2020-04-19 DIAGNOSIS — E039 Hypothyroidism, unspecified: Secondary | ICD-10-CM

## 2020-04-19 DIAGNOSIS — Z23 Encounter for immunization: Secondary | ICD-10-CM | POA: Diagnosis not present

## 2020-04-20 LAB — TSH: TSH: 2.7 mIU/L (ref 0.40–4.50)

## 2020-04-20 NOTE — Progress Notes (Signed)
Thyroid is back in normal range.  Continue levothyroxine 175mcg daily at least 30 mins before breakfast and other meds.
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
## 2020-04-30 ENCOUNTER — Ambulatory Visit: Payer: HMO | Admitting: Nurse Practitioner

## 2020-05-01 ENCOUNTER — Encounter: Payer: Self-pay | Admitting: Nurse Practitioner

## 2020-05-01 ENCOUNTER — Other Ambulatory Visit: Payer: Self-pay

## 2020-05-01 ENCOUNTER — Ambulatory Visit (INDEPENDENT_AMBULATORY_CARE_PROVIDER_SITE_OTHER): Payer: HMO | Admitting: Nurse Practitioner

## 2020-05-01 VITALS — BP 132/110 | HR 74 | Ht 65.0 in | Wt 140.6 lb

## 2020-05-01 DIAGNOSIS — I5042 Chronic combined systolic (congestive) and diastolic (congestive) heart failure: Secondary | ICD-10-CM

## 2020-05-01 DIAGNOSIS — Z952 Presence of prosthetic heart valve: Secondary | ICD-10-CM

## 2020-05-01 DIAGNOSIS — I428 Other cardiomyopathies: Secondary | ICD-10-CM

## 2020-05-01 DIAGNOSIS — Z9889 Other specified postprocedural states: Secondary | ICD-10-CM | POA: Diagnosis not present

## 2020-05-01 MED ORDER — FUROSEMIDE 40 MG PO TABS: 40.0000 mg | ORAL_TABLET | Freq: Every day | ORAL | 0 refills | Status: DC

## 2020-05-01 NOTE — Patient Instructions (Addendum)
After Visit Summary:  We will be checking the following labs today - NONE   Medication Instructions:    Continue with your current medicines.    If you need a refill on your cardiac medications before your next appointment, please call your pharmacy.     Testing/Procedures To Be Arranged:  Echocardiogram  Follow-Up:   See me for a telephone visit after the echo and to discuss the BP    At [facility], you and your health needs are our priority.  As part of our continuing mission to provide you with exceptional heart care, we have created designated Provider Care Teams.  These Care Teams include your primary Cardiologist (physician) and Advanced Practice Providers (APPs -  Physician Assistants and Nurse Practitioners) who all work together to provide you with the care you need, when you need it.  Special Instructions:  . Stay safe, wash your hands for at least 20 seconds and wear a mask when needed.  . It was good to talk with you today.  . Start checking your BP for me - at least once a day - keep a record so we can discuss.    Call the [place] office at [phone] if you have any questions, problems or concerns.

## 2020-05-14 ENCOUNTER — Ambulatory Visit (HOSPITAL_COMMUNITY): Payer: HMO | Attending: Cardiology

## 2020-05-14 ENCOUNTER — Other Ambulatory Visit: Payer: Self-pay

## 2020-05-14 DIAGNOSIS — I428 Other cardiomyopathies: Secondary | ICD-10-CM | POA: Insufficient documentation

## 2020-05-14 DIAGNOSIS — Z952 Presence of prosthetic heart valve: Secondary | ICD-10-CM | POA: Diagnosis not present

## 2020-05-14 DIAGNOSIS — Z9889 Other specified postprocedural states: Secondary | ICD-10-CM | POA: Insufficient documentation

## 2020-05-14 DIAGNOSIS — I5042 Chronic combined systolic (congestive) and diastolic (congestive) heart failure: Secondary | ICD-10-CM | POA: Insufficient documentation

## 2020-05-14 LAB — ECHOCARDIOGRAM COMPLETE
AR max vel: 1.76 cm2
AV Area VTI: 2.07 cm2
AV Area mean vel: 1.71 cm2
AV Mean grad: 9 mmHg
AV Peak grad: 16.5 mmHg
Ao pk vel: 2.03 m/s
Area-P 1/2: 1.97 cm2
MV M vel: 6.37 m/s
MV Peak grad: 162.1 mmHg
Radius: 0.4 cm
S' Lateral: 2.8 cm

## 2020-05-14 NOTE — Progress Notes (Signed)
Telehealth Visit     Virtual Visit via Video Note   This visit type was conducted due to national recommendations for restrictions regarding the COVID-19 Pandemic (e.g. social distancing) in an effort to limit this patient's exposure and mitigate transmission in our community.  Due to her co-morbid illnesses, this patient is at least at moderate risk for complications without adequate follow up.  This format is felt to be most appropriate for this patient at this time.  All issues noted in this document were discussed and addressed.  A limited physical exam was performed with this format.  Please refer to the patient's chart for her consent to telehealth for [facility]  Video Connection Lost Video connection was lost at > 50% of the duration of this visit, at which time the remainder of the visit was completed via audio only.     Evaluation Performed:  Follow-up visit   The patient was identified using 2 identifiers.   This visit type was conducted due to national recommendations for restrictions regarding the COVID-19 Pandemic (e.g. social distancing).  This format is felt to be most appropriate for this patient at this time.  All issues noted in this document were discussed and addressed.  No physical exam was performed (except for noted visual exam findings with Video Visits).  Please refer to the patient's chart (MyChart message for video visits and phone note for telephone visits) for the patient's consent to telehealth for [facility].  Date:  05/21/2020   ID:  Miranda [name], DOB 1965-09-28, MRN [id number]  Patient Location:  Home  Provider location:   [facility] Office  PCP:  Gayland Curry, DO  Cardiologist:  Servando Snare & Candee Furbish, MD  Electrophysiologist:  None   Chief Complaint:  Follow up  History of Present Illness:    Miranda [name] is a 54 y.o. female who presents via audio/video conferencing for a telehealth visit today.  Seen for Dr. Marlou Porch.   She  has a history of HLD, HTN, iron deficiency anemia, mild nonobstructive CAD,chronic systolic HF, NICM, with previous EFat15% with recovery. Her EKG has beenchronically abnormal with T wave inversion in several leads. She has had  AVR with Dr. Cyndia Bent on 03/12/2017 with23 mm Edwards Magna-Ease pericardial valve and MV annuloplasty with 28 mm Sorin 3D MEMO ring after presenting with acute heart failure/severe AI.She was treated with coumadin for 3 months and then switched over to aspirin. Mild coronary disease at time of cath.   She had to switch back over to ACE from [facility] due to insurance denial. She has had prior elevation in LFTs of unknown etiology. She has had her medicines cut back due to lower blood pressure.   Last seen by Dr. Marlou Porch in March and felt to be doing ok.   I saw her about 2 weeks ago - BP up - having headache. She has previously had her medicines cut back due to low BP. Echo was updated. She was to monitor her BP for [place].   The patient does not have symptoms concerning for COVID-19 infection (fever, chills, cough, or new shortness of breath).   Seen today via [facility] Video. She has consented for this visit. This was converted to a telephone call due to poor reception on both our parts. She is doing ok. Still with some headache. BP is still up for the most part. No chest pain and not short of breath. Echo is reviewed with her.    Past Medical History:  Diagnosis  Date  . Anemia   . Anxiety   . At risk for sudden cardiac death Nov 17, 2012  . CHF - Combined Systolic + Diastolic Dysfunction. EF of 15-20% with Grade II diastolic dysfunction on echo 10/27/12 10/28/2012  . Coronary artery disease   . Depression   . Dyspnea   . Fibroids   . Heart murmur   . Hypertension   . Hypothyroidism   . S/P cardiac catheterization, 11-17-2012, normal coronaries with minimal luminal irregularities in RCA system 10/30/2012  . Thyroid disease    hypothyroidism   Past Surgical History:   Procedure Laterality Date  . AORTIC VALVE REPLACEMENT N/A 03/12/2017   Procedure: AORTIC VALVE REPLACEMENT (AVR) with Mitral Annuloplasty;  Surgeon: Gaye Pollack, MD;  Location: [facility] OR;  Service: Open Heart Surgery;  Laterality: N/A;  . LEFT AND RIGHT HEART CATHETERIZATION WITH CORONARY ANGIOGRAM N/A 11-17-12   Procedure: LEFT AND RIGHT HEART CATHETERIZATION WITH CORONARY ANGIOGRAM;  Surgeon: Leonie Man, MD;  Location: [facility] C[facility] LAB;  Service: Cardiovascular;  Laterality: N/A;  . MITRAL VALVE REPAIR N/A 03/12/2017   Procedure: MITRAL VALVE REPAIR (MVR);  Surgeon: Gaye Pollack, MD;  Location: [place];  Service: Open Heart Surgery;  Laterality: N/A;  mitral annuloplasty  . MULTIPLE EXTRACTIONS WITH ALVEOLOPLASTY N/A 01/21/2017   Procedure: Extraction of tooth #'s 4-13 with alveoloplasty;  Surgeon: Lenn Cal, DDS;  Location: [place];  Service: Oral Surgery;  Laterality: N/A;  . RIGHT/LEFT HEART CATH AND CORONARY ANGIOGRAPHY N/A 01/09/2017   Procedure: Right/Left Heart Cath and Coronary Angiography;  Surgeon: Troy Sine, MD;  Location: [place] CV LAB;  Service: Cardiovascular;  Laterality: N/A;  . TEE WITHOUT CARDIOVERSION N/A 03/12/2017   Procedure: TRANSESOPHAGEAL ECHOCARDIOGRAM (TEE);  Surgeon: Gaye Pollack, MD;  Location: [place];  Service: Open Heart Surgery;  Laterality: N/A;     Current Meds  Medication Sig  . aspirin EC 81 MG EC tablet Take 1 tablet (81 mg total) by mouth daily.  Marland Kitchen buPROPion (WELLBUTRIN XL) 150 MG 24 hr tablet Take 1 tablet (150 mg total) by mouth daily.  . cholecalciferol (VITAMIN D3) 25 MCG (1000 UNIT) tablet Take 1,000 Units by mouth daily.  . diclofenac sodium (VOLTAREN) 1 % GEL Apply 4 g topically as needed.  . ferrous sulfate 325 (65 FE) MG EC tablet Take 1 tablet (325 mg total) by mouth 2 (two) times daily.  . fluticasone (FLONASE) 50 MCG/ACT nasal spray USE 1 SPRAY(S) IN EACH NOSTRIL ONCE DAILY AS NEEDED FOR ALLERGIES OR RHINITIS  . furosemide  (LASIX) 40 MG tablet Take 1 tablet (40 mg total) by mouth daily.  Marland Kitchen levothyroxine (SYNTHROID) 100 MCG tablet Take 1 tablet (100 mcg total) by mouth daily before breakfast.  . lisinopril (ZESTRIL) 5 MG tablet Take 1 tablet (5 mg total) by mouth at bedtime.  . mirtazapine (REMERON) 15 MG tablet TAKE 1 TABLET BY MOUTH EVERY DAY AT BEDTIME  . rosuvastatin (CRESTOR) 40 MG tablet Take 1 tablet (40 mg total) by mouth daily.  . traZODone (DESYREL) 150 MG tablet TAKE 1 TABLET BY MOUTH EVERY DAY AT BEDTIME  . [DISCONTINUED] carvedilol (COREG) 3.125 MG tablet Take 1 tablet (3.125 mg total) by mouth 2 (two) times daily.     Allergies:   Codeine   Social History   Tobacco Use  . Smoking status: Former Smoker    Packs/day: 0.50    Years: 5.00    Pack years: 2.50    Types: Cigars, Cigarettes  Quit date: 08/04/1978    Years since quitting: 41.8  . Smokeless tobacco: Never Used  . Tobacco comment: 1 cigar daily-quit 10/24/12    Vaping Use  . Vaping Use: Never used  Substance Use Topics  . Alcohol use: Yes    Alcohol/week: 3.0 standard drinks    Types: 3 Glasses of wine per week    Comment: per month  . Drug use: Not Currently    Frequency: 5.0 times per week    Types: Marijuana    Comment: few weeks ago last     Family Hx: The patient's family history includes Arthritis in her paternal uncle; Bone cancer in her maternal uncle and paternal grandfather; Breast cancer in her cousin; Colon polyps in her mother; Heart disease in her maternal grandmother and mother; Hypertension in her father, maternal grandmother, and mother; Liver disease in her mother; Stroke in her father.  ROS:   Please see the history of present illness.   All other systems reviewed are negative.    Objective:    Vital Signs:  BP (!) 142/79   Pulse 71   Ht '5\' 5"'$  (1.651 m)   Wt 140 lb (63.5 kg)   LMP 01/17/2015   BMI 23.30 kg/m    Wt Readings from Last 3 Encounters:  05/21/20 140 lb (63.5 kg)  05/01/20 140 lb 9.6  oz (63.8 kg)  02/13/20 140 lb 6.4 oz (63.7 kg)    Alert female in no acute distress. She sounds appropriate.    Labs/Other Tests and Data Reviewed:    Lab Results  Component Value Date   WBC 3.6 (L) 02/13/2020   HGB 12.1 02/13/2020   HCT 37.6 02/13/2020   PLT 146 02/13/2020   GLUCOSE 91 02/13/2020   CHOL 139 02/13/2020   TRIG 82 02/13/2020   HDL 51 02/13/2020   LDLCALC 72 02/13/2020   ALT 14 02/13/2020   AST 25 02/13/2020   NA 140 02/13/2020   K 3.6 02/13/2020   CL 109 02/13/2020   CREATININE 0.91 02/13/2020   BUN 13 02/13/2020   CO2 26 02/13/2020   TSH 2.70 04/19/2020   INR 0.95 09/14/2017   HGBA1C 5.4 06/08/2019     BNP (last 3 results) No results for input(s): BNP in the last 8760 hours.  ProBNP (last 3 results) No results for input(s): PROBNP in the last 8760 hours.    Prior CV studies:    The following studies were reviewed today:  ECHO IMPRESSIONS 05/2020  1. Left ventricular ejection fraction, by estimation, is 60 to 65%. The  left ventricle has normal function. The left ventricle has no regional  wall motion abnormalities. There is moderate left ventricular hypertrophy.  Left ventricular diastolic  parameters are consistent with Grade II diastolic dysfunction  (pseudonormalization). Elevated left atrial pressure.  2. Right ventricular systolic function is mildly reduced. The right  ventricular size is normal. There is normal pulmonary artery systolic  pressure. The estimated right ventricular systolic pressure is [id number] mmHg.  3. Left atrial size was severely dilated.  4. The mitral valve has been repaired/replaced. There is a 28 mm  prosthetic annuloplasty ring present in the mitral position. Mild to  moderate mitral valve regurgitation. Mild mitral stenosis. MG 4 mmHg at HR  62 bpm, MVA 1.9 cm^2 by continuity equation.  5. The aortic valve has been repaired/replaced. There is a 23 mm  bioprosthetic valve present in the aortic position. Aortic  valve  regurgitation is not visualized. Echo findings are  consistent with normal  structure and function of the aortic valve  prosthesis. MG 9 mmHg, stable from prior echo on 10/19/17.  6. Aortic dilatation noted. There is mild dilatation of the aortic root,  measuring 42 mm. There is mild dilatation of the ascending aorta,  measuring 40 mm.  7. The inferior vena cava is normal in size with greater than 50%  respiratory variability, suggesting right atrial pressure of 3 mmHg.    ASSESSMENT & PLAN:    1. HTN - BP remains up - increasing the Coreg to 6.25 mg BID - she is to continue to monitor - her medicines were cut back in the past due to dizziness and hypotension.   2. Chronic systolic and diastolic HF - EF remains ok. Most recent echo reviewed with her. She was not able to afford Entresto and previously had all her medicines cut back due to symptomatic hypotension.   3. Prior AVR and MV repair - echo reviewed.   4. HLD - on low dose statin - has had prior elevation in her LFTS of unknown etiology - this has been stable.   5. Non obstructive CAD - managed medically - no worrisome symptoms.   6. Dilated aorta - she is aware - increasing beta blocker today - this will need to be followed.   7. Chronic iron deficiency anemia - not discussed.    Patient Risk:   After full review of this patient's clinical status, I feel that they are at least moderate risk at this time.  Time:   Today, I have spent 5 minutes with the patient with telehealth technology discussing the above issues.     Medication Adjustments/Labs and Tests Ordered: Current medicines are reviewed at length with the patient today.  Concerns regarding medicines are outlined above.   Tests Ordered: No orders of the defined types were placed in this encounter.   Medication Changes: Meds ordered this encounter  Medications  . carvedilol (COREG) 6.25 MG tablet    Sig: Take 1 tablet (6.25 mg total) by mouth 2 (two)  times daily.    Dispense:  180 tablet    Refill:  3    Order Specific Question:   Supervising Provider    Answer:   Martinique, PETER M [[id number]]    Disposition:  FU with [place] in about 6 weeks. Coreg is increased today.    Patient is agreeable to this plan and will call if any problems develop in the interim.   Amie Critchley, NP  05/21/2020 8:25 AM    [facility]

## 2020-05-21 ENCOUNTER — Telehealth (INDEPENDENT_AMBULATORY_CARE_PROVIDER_SITE_OTHER): Payer: HMO | Admitting: Nurse Practitioner

## 2020-05-21 ENCOUNTER — Encounter: Payer: Self-pay | Admitting: Nurse Practitioner

## 2020-05-21 ENCOUNTER — Telehealth: Payer: Self-pay | Admitting: *Deleted

## 2020-05-21 ENCOUNTER — Other Ambulatory Visit: Payer: Self-pay

## 2020-05-21 VITALS — BP 142/79 | HR 71 | Ht 65.0 in | Wt 140.0 lb

## 2020-05-21 DIAGNOSIS — I5042 Chronic combined systolic (congestive) and diastolic (congestive) heart failure: Secondary | ICD-10-CM

## 2020-05-21 DIAGNOSIS — I952 Hypotension due to drugs: Secondary | ICD-10-CM | POA: Diagnosis not present

## 2020-05-21 DIAGNOSIS — E7849 Other hyperlipidemia: Secondary | ICD-10-CM | POA: Diagnosis not present

## 2020-05-21 DIAGNOSIS — Z7901 Long term (current) use of anticoagulants: Secondary | ICD-10-CM

## 2020-05-21 DIAGNOSIS — I428 Other cardiomyopathies: Secondary | ICD-10-CM | POA: Diagnosis not present

## 2020-05-21 DIAGNOSIS — Z9889 Other specified postprocedural states: Secondary | ICD-10-CM | POA: Diagnosis not present

## 2020-05-21 DIAGNOSIS — Z952 Presence of prosthetic heart valve: Secondary | ICD-10-CM | POA: Diagnosis not present

## 2020-05-21 MED ORDER — CARVEDILOL 6.25 MG PO TABS: 6.2500 mg | ORAL_TABLET | Freq: Two times a day (BID) | ORAL | 3 refills | Status: DC

## 2020-05-21 NOTE — Telephone Encounter (Signed)
  Patient Consent for Virtual Visit         Miranda [name] has provided verbal consent on 05/21/2020 for a virtual visit (video or telephone).   CONSENT FOR VIRTUAL VISIT FOR:  Miranda [name]  By participating in this virtual visit I agree to the following:  I hereby voluntarily request, consent and authorize [facility] and its employed or contracted physicians, physician assistants, nurse practitioners or other licensed health care professionals (the Practitioner), to provide me with telemedicine health care services (the "Services") as deemed necessary by the treating Practitioner. I acknowledge and consent to receive the Services by the Practitioner via telemedicine. I understand that the telemedicine visit will involve communicating with the Practitioner through live audiovisual communication technology and the disclosure of certain medical information by electronic transmission. I acknowledge that I have been given the opportunity to request an in-person assessment or other available alternative prior to the telemedicine visit and am voluntarily participating in the telemedicine visit.  I understand that I have the right to withhold or withdraw my consent to the use of telemedicine in the course of my care at any time, without affecting my right to future care or treatment, and that the Practitioner or I may terminate the telemedicine visit at any time. I understand that I have the right to inspect all information obtained and/or recorded in the course of the telemedicine visit and may receive copies of available information for a reasonable fee.  I understand that some of the potential risks of receiving the Services via telemedicine include:  Marland Kitchen Delay or interruption in medical evaluation due to technological equipment failure or disruption; . Information transmitted may not be sufficient (e.g. poor resolution of images) to allow for appropriate medical decision making by the  Practitioner; and/or  . In rare instances, security protocols could fail, causing a breach of personal health information.  Furthermore, I acknowledge that it is my responsibility to provide information about my medical history, conditions and care that is complete and accurate to the best of my ability. I acknowledge that Practitioner's advice, recommendations, and/or decision may be based on factors not within their control, such as incomplete or inaccurate data provided by me or distortions of diagnostic images or specimens that may result from electronic transmissions. I understand that the practice of medicine is not an exact science and that Practitioner makes no warranties or guarantees regarding treatment outcomes. I acknowledge that a copy of this consent can be made available to me via my patient portal ([place]), or I can request a printed copy by calling the office of [place].    I understand that my insurance will be billed for this visit.   I have read or had this consent read to me. . I understand the contents of this consent, which adequately explains the benefits and risks of the Services being provided via telemedicine.  . I have been provided ample opportunity to ask questions regarding this consent and the Services and have had my questions answered to my satisfaction. . I give my informed consent for the services to be provided through the use of telemedicine in my medical care

## 2020-05-21 NOTE — Patient Instructions (Addendum)
After Visit Summary:  We will be checking the following labs today - NONE   Medication Instructions:    Continue with your current medicines. BUT  We are increasing the Coreg to 6.25 mg to take twice a day - you can take 2 of the 3.125 mg tablets twice a day and use those up - the RX for the 6.25 mg is at your pharmacy.    If you need a refill on your cardiac medications before your next appointment, please call your pharmacy.     Testing/Procedures To Be Arranged:  N/A  Follow-Up:   See me in about 6 weeks.     At [facility], you and your health needs are our priority.  As part of our continuing mission to provide you with exceptional heart care, we have created designated Provider Care Teams.  These Care Teams include your primary Cardiologist (physician) and Advanced Practice Providers (APPs -  Physician Assistants and Nurse Practitioners) who all work together to provide you with the care you need, when you need it.  Special Instructions:  . Stay safe, wash your hands for at least 20 seconds and wear a mask when needed.  . It was good to talk with you today.  Marland Kitchen Keep a check on your BP for [place] - goal is less than 130/80 - send me a message or call the office if you see your BP is not going down over the next week or so.    Call the [place] office at [phone] if you have any questions, problems or concerns.

## 2020-06-04 ENCOUNTER — Other Ambulatory Visit: Payer: Self-pay | Admitting: Internal Medicine

## 2020-06-05 ENCOUNTER — Other Ambulatory Visit: Payer: Self-pay

## 2020-06-05 NOTE — Patient Outreach (Signed)
  [facility]) Care Management Chronic Special Needs Program    06/05/2020  Name: Tija Biss, DOB: 1966-01-13  MRN: [id number]   Ms. Empress Struckman is enrolled in a chronic special needs plan for Diabetes.  [facility] will continue to provide services for this client through 08/03/2020. The [employer] care management team will assume care 08/04/2020.  Peter Garter RN, Jackquline Denmark, CDE Chronic Care Management Coordinator [facility] Care Management [phone]

## 2020-06-20 NOTE — Progress Notes (Signed)
CARDIOLOGY OFFICE NOTE  Date:  07/04/2020    Miranda [name] Date of Birth: 1965/10/12 Medical Record #[id number]  PCP:  Gayland Curry, DO  Cardiologist:  Marisa Cyphers  Chief Complaint  Patient presents with  . Follow-up    Seen for Dr. Marlou Porch    History of Present Illness: Miranda [name] is a 54 y.o. female who presents today for a follow up visit. Seen for Dr. Marlou Porch.  She has a history ofHLD, HTN, iron deficiency anemia, mild nonobstructive CAD,chronic systolic HF, NICM, with previous EFat15%with recovery. Her EKG has beenchronically abnormal with T wave inversion in several leads.She has hadAVR with Dr. Cyndia Bent on 03/12/2017 with23 mm Edwards Magna-Ease pericardial valve and MV annuloplasty with 28 mm Sorin 3D MEMO ringafter presenting with acute heart failure/severe AI.She was treated with coumadin for 3 months and then switched over to aspirin.Mild coronary disease at time of cath.   She had to switch back over to [facility] from [facility] due to insurance denial. She has had prior elevation in LFTs of unknown etiology. She has had her medicines cut back due to lower blood pressure.   Last seen by Dr. Marlou Porch in March and felt to be doing ok.I saw her in September and then did a video visit last month due to BP being up and having associated headache. Echo was updated. Medicines have been titrated back up.   Comes in today. Here alone. She is doing ok. No chest pain. Breathing is good. BP good at home - sometimes in the 90's - but she is not dizzy with this at all. Feels pretty good overall. Planning on doing some fishing this week since the temperature is improving. No more headache.   Past Medical History:  Diagnosis Date  . Anemia   . Anxiety   . At risk for sudden cardiac death 2012/11/23  . CHF - Combined Systolic + Diastolic Dysfunction. EF of 15-20% with Grade II diastolic dysfunction on echo 10/27/12 10/28/2012  . Coronary artery disease   .  Depression   . Dyspnea   . Fibroids   . Heart murmur   . Hypertension   . Hypothyroidism   . S/P cardiac catheterization, 2012-11-23, normal coronaries with minimal luminal irregularities in RCA system 10/30/2012  . Thyroid disease    hypothyroidism    Past Surgical History:  Procedure Laterality Date  . AORTIC VALVE REPLACEMENT N/A 03/12/2017   Procedure: AORTIC VALVE REPLACEMENT (AVR) with Mitral Annuloplasty;  Surgeon: Gaye Pollack, MD;  [facility];  Service: Open Heart Surgery;  Laterality: N/A;  . LEFT AND RIGHT HEART CATHETERIZATION WITH CORONARY ANGIOGRAM N/A 23-Nov-2012   Procedure: LEFT AND RIGHT HEART CATHETERIZATION WITH CORONARY ANGIOGRAM;  Surgeon: Leonie Man, MD;  Location: [facility] CATH LAB;  Service: Cardiovascular;  Laterality: N/A;  . MITRAL VALVE REPAIR N/A 03/12/2017   Procedure: MITRAL VALVE REPAIR (MVR);  Surgeon: Gaye Pollack, MD;  Location: [place];  Service: Open Heart Surgery;  Laterality: N/A;  mitral annuloplasty  . MULTIPLE EXTRACTIONS WITH ALVEOLOPLASTY N/A 01/21/2017   Procedure: Extraction of tooth #'s 4-13 with alveoloplasty;  Surgeon: Lenn Cal, DDS;  Location: [place];  Service: Oral Surgery;  Laterality: N/A;  . RIGHT/LEFT HEART CATH AND CORONARY ANGIOGRAPHY N/A 01/09/2017   Procedure: Right/Left Heart Cath and Coronary Angiography;  Surgeon: Troy Sine, MD;  Location: [place] CV LAB;  Service: Cardiovascular;  Laterality: N/A;  . TEE WITHOUT CARDIOVERSION N/A 03/12/2017   Procedure:  TRANSESOPHAGEAL ECHOCARDIOGRAM (TEE);  Surgeon: Gaye Pollack, MD;  Location: [place];  Service: Open Heart Surgery;  Laterality: N/A;     Medications: Current Meds  Medication Sig  . aspirin EC 81 MG EC tablet Take 1 tablet (81 mg total) by mouth daily.  Marland Kitchen buPROPion (WELLBUTRIN XL) 150 MG 24 hr tablet Take 1 tablet by mouth once daily  . carvedilol (COREG) 6.25 MG tablet Take 1 tablet (6.25 mg total) by mouth 2 (two) times daily.  . cholecalciferol (VITAMIN  D3) 25 MCG (1000 UNIT) tablet Take 1,000 Units by mouth daily.  . diclofenac sodium (VOLTAREN) 1 % GEL Apply 4 g topically as needed.  . EUTHYROX 100 MCG tablet TAKE 1 TABLET BY MOUTH ONCE DAILY BEFORE BREAKFAST  . ferrous sulfate 325 (65 FE) MG EC tablet Take 1 tablet (325 mg total) by mouth 2 (two) times daily.  . fluticasone (FLONASE) 50 MCG/ACT nasal spray USE 1 SPRAY(S) IN EACH NOSTRIL ONCE DAILY AS NEEDED FOR ALLERGIES OR RHINITIS  . furosemide (LASIX) 40 MG tablet Take 1 tablet (40 mg total) by mouth daily.  Marland Kitchen lisinopril (ZESTRIL) 5 MG tablet Take 1 tablet (5 mg total) by mouth at bedtime.  . mirtazapine (REMERON) 15 MG tablet TAKE 1 TABLET BY MOUTH EVERY DAY AT BEDTIME  . rosuvastatin (CRESTOR) 40 MG tablet Take 1 tablet (40 mg total) by mouth daily.  . traZODone (DESYREL) 150 MG tablet TAKE 1 TABLET BY MOUTH EVERY DAY AT BEDTIME     Allergies: Allergies  Allergen Reactions  . Codeine Hives    Social History: The patient  reports that she quit smoking about 41 years ago. Her smoking use included cigars and cigarettes. She has a 2.50 pack-year smoking history. She has never used smokeless tobacco. She reports current alcohol use of about 3.0 standard drinks of alcohol per week. She reports previous drug use. Frequency: 5.00 times per week. Drug: Marijuana.   Family History: The patient's family history includes Arthritis in her paternal uncle; Bone cancer in her maternal uncle and paternal grandfather; Breast cancer in her cousin; Colon polyps in her mother; Heart disease in her maternal grandmother and mother; Hypertension in her father, maternal grandmother, and mother; Liver disease in her mother; Stroke in her father.   Review of Systems: Please see the history of present illness.   All other systems are reviewed and negative.   Physical Exam: VS:  BP 110/80   Pulse 82   Ht 5\' 5"  (1.651 m)   Wt 136 lb 3.2 oz (61.8 kg)   LMP 01/17/2015   SpO2 99%   BMI 22.66 kg/m  .  BMI  Body mass index is 22.66 kg/m.  Wt Readings from Last 3 Encounters:  07/04/20 136 lb 3.2 oz (61.8 kg)  05/21/20 140 lb (63.5 kg)  05/01/20 140 lb 9.6 oz (63.8 kg)    General: Pleasant. Alert and in no acute distress.   Cardiac: Regular rate and rhythm. No real murmur that I appreciate. No edema.  Respiratory:  Lungs are clear to auscultation bilaterally with normal work of breathing.  GI: Soft and nontender.  MS: No deformity or atrophy. Gait and ROM intact.  Skin: Warm and dry. Color is normal.  Neuro:  Strength and sensation are intact and no gross focal deficits noted.  Psych: Alert, appropriate and with normal affect.   LABORATORY DATA:  EKG:  EKG is ordered today.  Personally reviewed by me. This demonstrates NSR with diffuse inferolateral T wave  inversion - HR is 82 - short PR - tracing unchanged.  Lab Results  Component Value Date   WBC 3.6 (L) 02/13/2020   HGB 12.1 02/13/2020   HCT 37.6 02/13/2020   PLT 146 02/13/2020   GLUCOSE 91 02/13/2020   CHOL 139 02/13/2020   TRIG 82 02/13/2020   HDL 51 02/13/2020   LDLCALC 72 02/13/2020   ALT 14 02/13/2020   AST 25 02/13/2020   NA 140 02/13/2020   K 3.6 02/13/2020   CL 109 02/13/2020   CREATININE 0.91 02/13/2020   BUN 13 02/13/2020   CO2 26 02/13/2020   TSH 2.70 04/19/2020   INR 0.95 09/14/2017   HGBA1C 5.4 06/08/2019     BNP (last 3 results) No results for input(s): BNP in the last 8760 hours.  ProBNP (last 3 results) No results for input(s): PROBNP in the last 8760 hours.   Other Studies Reviewed Today:  ECHO IMPRESSIONS 05/2020  1. Left ventricular ejection fraction, by estimation, is 60 to 65%. The  left ventricle has normal function. The left ventricle has no regional  wall motion abnormalities. There is moderate left ventricular hypertrophy.  Left ventricular diastolic  parameters are consistent with Grade II diastolic dysfunction  (pseudonormalization). Elevated left atrial pressure.  2. Right  ventricular systolic function is mildly reduced. The right  ventricular size is normal. There is normal pulmonary artery systolic  pressure. The estimated right ventricular systolic pressure is 99.3 mmHg.  3. Left atrial size was severely dilated.  4. The mitral valve has been repaired/replaced. There is a 28 mm  prosthetic annuloplasty ring present in the mitral position. Mild to  moderate mitral valve regurgitation. Mild mitral stenosis. MG 4 mmHg at HR  62 bpm, MVA 1.9 cm^2 by continuity equation.  5. The aortic valve has been repaired/replaced. There is a 23 mm  bioprosthetic valve present in the aortic position. Aortic valve  regurgitation is not visualized. Echo findings are consistent with normal  structure and function of the aortic valve  prosthesis. MG 9 mmHg, stable from prior echo on 10/19/17.  6. Aortic dilatation noted. There is mild dilatation of the aortic root,  measuring 42 mm. There is mild dilatation of the ascending aorta,  measuring 40 mm.  7. The inferior vena cava is normal in size with greater than 50%  respiratory variability, suggesting right atrial pressure of 3 mmHg.    ASSESSMENT & PLAN:    1. HTN - we have been able to titrate up her medicines given recent increase in symptomatic HTN - BP now looks good - she is tolerating this regimen - she has had prior tendency towards hypotension that has been symptomatic. Will leave on her current regimen. No changes made today.   2. Chronic systolic & diastolic HF - EF ok - was not able to afford Entresto - has typically not been on target doses due to symptomatic hypotension - her weight is stable - not short of breath. No changes made today.   3. Prior AVR/MV repair - most recent echo noted.   4. HLD - on low dose statin - has had prior elevation of her LFTs of unknown etiology - rechecking today.   5. Non obstructive CAD - managed medically - EKG chronically abnormal. She has no symptoms.   6. Dilated  aorta - on beta blocker - has good/target BP control - would follow.   7. Chronic iron deficiency anemia - not discussed.   Current medicines are reviewed with  the patient today.  The patient does not have concerns regarding medicines other than what has been noted above.  The following changes have been made:  See above.  Labs/ tests ordered today include:    Orders Placed This Encounter  Procedures  . Basic metabolic panel  . CBC  . Hepatic function panel  . Lipid panel  . EKG 12-Lead     Disposition:   FU with Dr. Marlou Porch in 4 months. She is aware that I am leaving in February.    Patient is agreeable to this plan and will call if any problems develop in the interim.   SignedTruitt Merle, NP  07/04/2020 9:12 AM  [facility] [address] [place], [place]  [address] Phone: [phone] Fax: [phone]

## 2020-07-04 ENCOUNTER — Other Ambulatory Visit: Payer: Self-pay

## 2020-07-04 ENCOUNTER — Other Ambulatory Visit: Payer: Self-pay | Admitting: Cardiology

## 2020-07-04 ENCOUNTER — Encounter: Payer: Self-pay | Admitting: Nurse Practitioner

## 2020-07-04 ENCOUNTER — Ambulatory Visit (INDEPENDENT_AMBULATORY_CARE_PROVIDER_SITE_OTHER): Payer: HMO | Admitting: Nurse Practitioner

## 2020-07-04 ENCOUNTER — Other Ambulatory Visit: Payer: Self-pay | Admitting: Internal Medicine

## 2020-07-04 VITALS — BP 110/80 | HR 82 | Ht 65.0 in | Wt 136.2 lb

## 2020-07-04 DIAGNOSIS — I428 Other cardiomyopathies: Secondary | ICD-10-CM

## 2020-07-04 DIAGNOSIS — I5042 Chronic combined systolic (congestive) and diastolic (congestive) heart failure: Secondary | ICD-10-CM | POA: Diagnosis not present

## 2020-07-04 DIAGNOSIS — Z7901 Long term (current) use of anticoagulants: Secondary | ICD-10-CM | POA: Diagnosis not present

## 2020-07-04 DIAGNOSIS — Z9889 Other specified postprocedural states: Secondary | ICD-10-CM | POA: Diagnosis not present

## 2020-07-04 DIAGNOSIS — E7849 Other hyperlipidemia: Secondary | ICD-10-CM | POA: Diagnosis not present

## 2020-07-04 DIAGNOSIS — F5104 Psychophysiologic insomnia: Secondary | ICD-10-CM

## 2020-07-04 DIAGNOSIS — Z952 Presence of prosthetic heart valve: Secondary | ICD-10-CM | POA: Diagnosis not present

## 2020-07-04 DIAGNOSIS — I10 Essential (primary) hypertension: Secondary | ICD-10-CM | POA: Diagnosis not present

## 2020-07-04 LAB — LIPID PANEL
Chol/HDL Ratio: 3.3 ratio (ref 0.0–4.4)
Cholesterol, Total: 186 mg/dL (ref 100–199)
HDL: 56 mg/dL (ref >39)
LDL Chol Calc (NIH): 113 mg/dL — ABNORMAL HIGH (ref 0–99)
Triglycerides: 92 mg/dL (ref 0–149)
VLDL Cholesterol Cal: 17 mg/dL (ref 5–40)

## 2020-07-04 LAB — HEPATIC FUNCTION PANEL
ALT: 16 IU/L (ref 0–32)
AST: 28 IU/L (ref 0–40)
Albumin: 5 g/dL — ABNORMAL HIGH (ref 3.8–4.9)
Alkaline Phosphatase: 90 IU/L (ref 44–121)
Bilirubin Total: 0.4 mg/dL (ref 0.0–1.2)
Bilirubin, Direct: <0.1 mg/dL (ref 0.00–0.40)
Total Protein: 9.9 g/dL — ABNORMAL HIGH (ref 6.0–8.5)

## 2020-07-04 LAB — CBC
Hematocrit: 42.7 % (ref 34.0–46.6)
Hemoglobin: 14.4 g/dL (ref 11.1–15.9)
MCH: 28.1 pg (ref 26.6–33.0)
MCHC: 33.7 g/dL (ref 31.5–35.7)
MCV: 83 fL (ref 79–97)
Platelets: 156 10*3/uL (ref 150–450)
RBC: 5.13 x10E6/uL (ref 3.77–5.28)
RDW: 13.5 % (ref 11.7–15.4)
WBC: 6 10*3/uL (ref 3.4–10.8)

## 2020-07-04 LAB — BASIC METABOLIC PANEL
BUN/Creatinine Ratio: 18 (ref 9–23)
BUN: 23 mg/dL (ref 6–24)
CO2: 23 mmol/L (ref 20–29)
Calcium: 10.4 mg/dL — ABNORMAL HIGH (ref 8.7–10.2)
Chloride: 96 mmol/L (ref 96–106)
Creatinine, Ser: 1.27 mg/dL — ABNORMAL HIGH (ref 0.57–1.00)
GFR calc Af Amer: 55 mL/min/{1.73_m2} — ABNORMAL LOW (ref >59)
GFR calc non Af Amer: 47 mL/min/{1.73_m2} — ABNORMAL LOW (ref >59)
Glucose: 93 mg/dL (ref 65–99)
Potassium: 3.5 mmol/L (ref 3.5–5.2)
Sodium: 136 mmol/L (ref 134–144)

## 2020-07-04 NOTE — Patient Instructions (Addendum)
After Visit Summary:  We will be checking the following labs today - BMET, CBC, HPF, Lipids   Medication Instructions:    Continue with your current medicines.    If you need a refill on your cardiac medications before your next appointment, please call your pharmacy.     Testing/Procedures To Be Arranged:  N/A  Follow-Up:   See Dr. Marlou Porch in 4 months    At [facility], you and your health needs are our priority.  As part of our continuing mission to provide you with exceptional heart care, we have created designated Provider Care Teams.  These Care Teams include your primary Cardiologist (physician) and Advanced Practice Providers (APPs -  Physician Assistants and Nurse Practitioners) who all work together to provide you with the care you need, when you need it.  Special Instructions:  . Stay safe, wash your hands for at least 20 seconds and wear a mask when needed.  . It was good to talk with you today.  Marland Kitchen Keep a check on your BP for [place]. Let us know if you have any dizzy spells.    Call the [place] office at [phone] if you have any questions, problems or concerns.

## 2020-07-05 NOTE — Telephone Encounter (Signed)
Patient last visit 02/13/2020 with Dr. Mariea Clonts. Last refill was in September

## 2020-07-05 NOTE — Progress Notes (Signed)
Thank you for sending these over and recommending she follow up with me.   It would be excellent if a serum protein electrophoresis could be added to the labs she already had drawn to evaluate her elevated protein levels.  (I'm not sure if a different tube would be necessary).  That would be a good start to eval.  Her renal function was worse, too, I note.

## 2020-07-26 ENCOUNTER — Telehealth: Payer: Self-pay | Admitting: *Deleted

## 2020-07-26 NOTE — Telephone Encounter (Signed)
Received [facility] Medication Adherence Report.  Patient stated that she is still taking her Rosuvastatin and DOES NOT need a refill on it. Stated she has plenty.

## 2020-08-07 ENCOUNTER — Other Ambulatory Visit: Payer: Self-pay

## 2020-08-16 ENCOUNTER — Other Ambulatory Visit: Payer: HMO

## 2020-08-20 ENCOUNTER — Ambulatory Visit: Payer: HMO | Admitting: Internal Medicine

## 2020-08-29 ENCOUNTER — Other Ambulatory Visit: Payer: HMO

## 2020-08-29 ENCOUNTER — Other Ambulatory Visit: Payer: Self-pay

## 2020-08-29 DIAGNOSIS — E782 Mixed hyperlipidemia: Secondary | ICD-10-CM | POA: Diagnosis not present

## 2020-08-29 DIAGNOSIS — F5104 Psychophysiologic insomnia: Secondary | ICD-10-CM | POA: Diagnosis not present

## 2020-08-29 DIAGNOSIS — F3342 Major depressive disorder, recurrent, in full remission: Secondary | ICD-10-CM | POA: Diagnosis not present

## 2020-08-29 DIAGNOSIS — E039 Hypothyroidism, unspecified: Secondary | ICD-10-CM | POA: Diagnosis not present

## 2020-08-29 DIAGNOSIS — I5022 Chronic systolic (congestive) heart failure: Secondary | ICD-10-CM | POA: Diagnosis not present

## 2020-08-29 LAB — CBC WITH DIFFERENTIAL/PLATELET
Absolute Monocytes: 491 cells/uL (ref 200–950)
Basophils Absolute: 32 cells/uL (ref 0–200)
Basophils Relative: 0.7 %
Eosinophils Absolute: 72 cells/uL (ref 15–500)
Eosinophils Relative: 1.6 %
HCT: 42.9 % (ref 35.0–45.0)
Hemoglobin: 13.7 g/dL (ref 11.7–15.5)
Lymphs Abs: 1382 cells/uL (ref 850–3900)
MCH: 27.9 pg (ref 27.0–33.0)
MCHC: 31.9 g/dL — ABNORMAL LOW (ref 32.0–36.0)
MCV: 87.4 fL (ref 80.0–100.0)
MPV: 11.6 fL (ref 7.5–12.5)
Monocytes Relative: 10.9 %
Neutro Abs: 2525 cells/uL (ref 1500–7800)
Neutrophils Relative %: 56.1 %
Platelets: 145 10*3/uL (ref 140–400)
RBC: 4.91 10*6/uL (ref 3.80–5.10)
RDW: 14 % (ref 11.0–15.0)
Total Lymphocyte: 30.7 %
WBC: 4.5 10*3/uL (ref 3.8–10.8)

## 2020-08-29 LAB — COMPLETE METABOLIC PANEL WITH GFR
AG Ratio: 1 (calc) (ref 1.0–2.5)
ALT: 12 U/L (ref 6–29)
AST: 27 U/L (ref 10–35)
Albumin: 4.5 g/dL (ref 3.6–5.1)
Alkaline phosphatase (APISO): 74 U/L (ref 37–153)
BUN: 16 mg/dL (ref 7–25)
CO2: 30 mmol/L (ref 20–32)
Calcium: 10 mg/dL (ref 8.6–10.4)
Chloride: 104 mmol/L (ref 98–110)
Creat: 1.02 mg/dL (ref 0.50–1.05)
GFR, Est African American: 71 mL/min/{1.73_m2} (ref >=60)
GFR, Est Non African American: 61 mL/min/{1.73_m2} (ref >=60)
Globulin: 4.4 g/dL (calc) — ABNORMAL HIGH (ref 1.9–3.7)
Glucose, Bld: 86 mg/dL (ref 65–99)
Potassium: 3.8 mmol/L (ref 3.5–5.3)
Sodium: 138 mmol/L (ref 135–146)
Total Bilirubin: 0.4 mg/dL (ref 0.2–1.2)
Total Protein: 8.9 g/dL — ABNORMAL HIGH (ref 6.1–8.1)

## 2020-08-29 LAB — LIPID PANEL
Cholesterol: 169 mg/dL (ref <200)
HDL: 57 mg/dL (ref >=50)
LDL Cholesterol (Calc): 97 mg/dL (calc)
Non-HDL Cholesterol (Calc): 112 mg/dL (calc) (ref <130)
Total CHOL/HDL Ratio: 3 (calc) (ref <5.0)
Triglycerides: 67 mg/dL (ref <150)

## 2020-08-29 LAB — TSH: TSH: 8.36 mIU/L — ABNORMAL HIGH (ref 0.40–4.50)

## 2020-08-30 NOTE — Progress Notes (Signed)
Both thyroid and cholesterol are off.  Has she been faithful with her meds?  We'll review at her visit.

## 2020-09-03 ENCOUNTER — Encounter: Payer: Self-pay | Admitting: Internal Medicine

## 2020-09-03 ENCOUNTER — Other Ambulatory Visit: Payer: Self-pay

## 2020-09-03 ENCOUNTER — Ambulatory Visit (INDEPENDENT_AMBULATORY_CARE_PROVIDER_SITE_OTHER): Payer: HMO | Admitting: Internal Medicine

## 2020-09-03 VITALS — BP 138/82 | HR 74 | Temp 97.2°F | Ht 65.0 in | Wt 140.8 lb

## 2020-09-03 DIAGNOSIS — F3342 Major depressive disorder, recurrent, in full remission: Secondary | ICD-10-CM

## 2020-09-03 DIAGNOSIS — E782 Mixed hyperlipidemia: Secondary | ICD-10-CM

## 2020-09-03 DIAGNOSIS — J449 Chronic obstructive pulmonary disease, unspecified: Secondary | ICD-10-CM

## 2020-09-03 DIAGNOSIS — I5042 Chronic combined systolic (congestive) and diastolic (congestive) heart failure: Secondary | ICD-10-CM

## 2020-09-03 DIAGNOSIS — E039 Hypothyroidism, unspecified: Secondary | ICD-10-CM

## 2020-09-03 NOTE — Progress Notes (Signed)
Location:  [facility] clinic Provider:  Dorthia Tout L. Mariea Clonts, D.O., C.M.D.  Goals of Care:  Advanced Directives 09/03/2020  Does Patient Have a Medical Advance Directive? No  Does patient want to make changes to medical advance directive? -  Would patient like information on creating a medical advance directive? No - Patient declined  Pre-existing out of facility DNR order (yellow form or pink MOST form) -     Chief Complaint  Patient presents with  . Medical Management of Chronic Issues    6 month follow up     HPI: Patient is a 55 y.o. female seen today for medical management of chronic diseases.    Mood has been pretty good.  No hallucinations.  Not short of breath.  Only like when she goes up and down the steps.  No chest pain or palpitations.  Hypothyroidism:  She occasionally misses her levothyroxine.  Actually missed the whole month worth when it fell behind her nightstand.  She didn't even think of it all that time until she was looking for something else and found them.  Might miss occasional cholesterol med.  It was up, too.  She is also going to start back exercising.    Past Medical History:  Diagnosis Date  . Anemia   . Anxiety   . At risk for sudden cardiac death 11-28-12  . CHF - Combined Systolic + Diastolic Dysfunction. EF of 15-20% with Grade II diastolic dysfunction on echo 10/27/12 10/28/2012  . Coronary artery disease   . Depression   . Dyspnea   . Fibroids   . Heart murmur   . Hypertension   . Hypothyroidism   . S/P cardiac catheterization, November 28, 2012, normal coronaries with minimal luminal irregularities in RCA system 10/30/2012  . Thyroid disease    hypothyroidism    Past Surgical History:  Procedure Laterality Date  . AORTIC VALVE REPLACEMENT N/A 03/12/2017   Procedure: AORTIC VALVE REPLACEMENT (AVR) with Mitral Annuloplasty;  Surgeon: Gaye Pollack, MD;  [facility]: [facility] OR;  Service: Open Heart Surgery;  Laterality: N/A;  . LEFT AND RIGHT HEART  CATHETERIZATION WITH CORONARY ANGIOGRAM N/A 28-Nov-2012   Procedure: LEFT AND RIGHT HEART CATHETERIZATION WITH CORONARY ANGIOGRAM;  Surgeon: Leonie Man, MD;  Location: [facility] CATH LAB;  Service: Cardiovascular;  Laterality: N/A;  . MITRAL VALVE REPAIR N/A 03/12/2017   Procedure: MITRAL VALVE REPAIR (MVR);  Surgeon: Gaye Pollack, MD;  Location: [place];  Service: Open Heart Surgery;  Laterality: N/A;  mitral annuloplasty  . MULTIPLE EXTRACTIONS WITH ALVEOLOPLASTY N/A 01/21/2017   Procedure: Extraction of tooth #'s 4-13 with alveoloplasty;  Surgeon: Lenn Cal, DDS;  Location: [place];  Service: Oral Surgery;  Laterality: N/A;  . RIGHT/LEFT HEART CATH AND CORONARY ANGIOGRAPHY N/A 01/09/2017   Procedure: Right/Left Heart Cath and Coronary Angiography;  Surgeon: Troy Sine, MD;  Location: [place] CV LAB;  Service: Cardiovascular;  Laterality: N/A;  . TEE WITHOUT CARDIOVERSION N/A 03/12/2017   Procedure: TRANSESOPHAGEAL ECHOCARDIOGRAM (TEE);  Surgeon: Gaye Pollack, MD;  Location: [place];  Service: Open Heart Surgery;  Laterality: N/A;    Allergies  Allergen Reactions  . Codeine Hives    Outpatient Encounter Medications as of 09/03/2020  Medication Sig  . aspirin EC 81 MG EC tablet Take 1 tablet (81 mg total) by mouth daily.  Marland Kitchen buPROPion (WELLBUTRIN XL) 150 MG 24 hr tablet Take 1 tablet by mouth once daily  . carvedilol (COREG) 6.25 MG tablet Take 1 tablet (  6.25 mg total) by mouth 2 (two) times daily.  . cholecalciferol (VITAMIN D3) 25 MCG (1000 UNIT) tablet Take 1,000 Units by mouth daily.  . diclofenac sodium (VOLTAREN) 1 % GEL Apply 4 g topically as needed.  . EUTHYROX 100 MCG tablet TAKE 1 TABLET BY MOUTH ONCE DAILY BEFORE BREAKFAST  . ferrous sulfate 325 (65 FE) MG EC tablet Take 1 tablet (325 mg total) by mouth 2 (two) times daily.  . fluticasone (FLONASE) 50 MCG/ACT nasal spray USE 1 SPRAY(S) IN EACH NOSTRIL ONCE DAILY AS NEEDED FOR ALLERGIES OR RHINITIS  . furosemide (LASIX) 40  MG tablet Take 1 tablet (40 mg total) by mouth daily.  Marland Kitchen lisinopril (ZESTRIL) 5 MG tablet Take 1 tablet (5 mg total) by mouth at bedtime.  . mirtazapine (REMERON) 15 MG tablet TAKE 1 TABLET BY MOUTH ONCE DAILY AT BEDTIME  . rosuvastatin (CRESTOR) 40 MG tablet Take 1 tablet (40 mg total) by mouth daily.  . traZODone (DESYREL) 150 MG tablet TAKE 1 TABLET BY MOUTH ONCE DAILY AT BEDTIME   No facility-administered encounter medications on file as of 09/03/2020.    Review of Systems:  Review of Systems  Constitutional: Negative for chills and fever.       Hot flashes persist--brought her own towel to wipe sweats--she has started a [employer] brand supplement recommended by her cardiology NP  HENT: Negative for congestion and sore throat.   Eyes: Positive for double vision.       Glasses  Respiratory: Negative for cough and shortness of breath.        DOE with stairs but not changed  Cardiovascular: Negative for chest pain, palpitations and leg swelling.  Gastrointestinal: Negative for abdominal pain and constipation.  Genitourinary: Negative for dysuria.  Musculoskeletal: Negative for falls and joint pain.       Not c/o knee pain today  Skin: Negative for rash.  Neurological: Negative for dizziness and loss of consciousness.  Endo/Heme/Allergies: Does not bruise/bleed easily.  Psychiatric/Behavioral: Negative for depression, hallucinations, memory loss and suicidal ideas. The patient is not nervous/anxious and does not have insomnia.     Health Maintenance  Topic Date Due  . MAMMOGRAM  09/01/2021  . PAP SMEAR-Modifier  06/12/2022  . Fecal DNA (Cologuard)  09/14/2022  . TETANUS/TDAP  01/24/2026  . INFLUENZA VACCINE  Completed  . COVID-19 Vaccine  Completed  . Hepatitis C Screening  Completed  . HIV Screening  Completed    Physical Exam: Vitals:   09/03/20 0838  BP: 138/82  Pulse: 74  Temp: (!) 97.2 F (36.2 C)  SpO2: 90%  Weight: 140 lb 12.8 oz (63.9 kg)  Height: 5\' 5"  (1.651  m)   Body mass index is 23.43 kg/m. Physical Exam Vitals reviewed.  Constitutional:      General: She is not in acute distress.    Appearance: Normal appearance. She is not toxic-appearing.  HENT:     Head: Normocephalic and atraumatic.  Eyes:     Comments: Glasses  Cardiovascular:     Rate and Rhythm: Normal rate and regular rhythm.     Pulses: Normal pulses.     Heart sounds: Normal heart sounds.  Pulmonary:     Effort: Pulmonary effort is normal.     Breath sounds: Normal breath sounds. No wheezing, rhonchi or rales.  Abdominal:     General: Bowel sounds are normal.     Palpations: Abdomen is soft.  Musculoskeletal:        General: Normal range  of motion.     Right lower leg: No edema.     Left lower leg: No edema.  Neurological:     General: No focal deficit present.     Mental Status: She is alert.     Cranial Nerves: Cranial nerve deficit present.     Motor: No weakness.     Gait: Gait normal.     Comments: strabismus  Psychiatric:        Mood and Affect: Mood normal.     Comments: Does not make eye contact much of the time, looks into lap     Labs reviewed: Basic Metabolic Panel: Recent Labs    02/13/20 1023 04/19/20 0822 07/04/20 0920 08/29/20 0806  NA 140  --  136 138  K 3.6  --  3.5 3.8  CL 109  --  96 104  CO2 26  --  23 30  GLUCOSE 91  --  93 86  BUN 13  --  23 16  CREATININE 0.91  --  1.27* 1.02  CALCIUM 9.4  --  10.4* 10.0  TSH 5.62* 2.70  --  8.36*   Liver Function Tests: Recent Labs    02/13/20 1023 07/04/20 0920 08/29/20 0806  AST 25 28 27   ALT 14 16 12   ALKPHOS  --  90  --   BILITOT 0.3 0.4 0.4  PROT 8.0 9.9* 8.9*  ALBUMIN  --  5.0*  --    No results for input(s): LIPASE, AMYLASE in the last 8760 hours. No results for input(s): AMMONIA in the last 8760 hours. CBC: Recent Labs    02/13/20 1023 07/04/20 0920 08/29/20 0806  WBC 3.6* 6.0 4.5  NEUTROABS 1,987  --  2,525  HGB 12.1 14.4 13.7  HCT 37.6 42.7 42.9  MCV 85.6 83  87.4  PLT 146 156 145   Lipid Panel: Recent Labs    02/13/20 1023 07/04/20 0920 08/29/20 0806  CHOL 139 186 169  HDL 51 56 57  LDLCALC 72 113* 97  TRIG 82 92 67  CHOLHDL 2.7 3.3 3.0   Lab Results  Component Value Date   HGBA1C 5.4 06/08/2019    Assessment/Plan 1. Hypothyroidism, unspecified type -she missed a whole month of her levothyroxine!  She admitted this today and has been back taking it after finding it behind her nightstand - cont current dose and recheck in 4 mos before next visit - TSH; Future  2. Mixed hyperlipidemia -also has missed occasional crestor and not exercising so this was up--says she's determined to get back and track to get her numbers back down--knows how important this is for her - Lipid panel; Future - BASIC METABOLIC PANEL WITH GFR; Future - CBC with Differential/Platelet; Future  3. Chronic obstructive pulmonary disease, unspecified COPD type ([place]) -no change in sob with exertion -noted on imaging, but no symptoms, wheezing or inhaler needs  4. Chronic combined systolic and diastolic congestive heart failure (HCC) -primary cause of her dyspnea on exertion which has not changed -followed also by cardiology -continues on zestril, lasix, coreg, asa  5. Recurrent major depressive disorder, in full remission ([place]) -remains in remission with wellbutrin, trazodone for sleep, remeron for appetite and sleep -not having hallucinations as she did prior to the wellbutrin and remeron regimen   Labs/tests ordered:   Lab Orders     TSH     Lipid panel     BASIC METABOLIC PANEL WITH GFR     CBC with Differential/Platelet She's also  to schedule her mammogram which is due  Next appt:  11/13/2020   Mattisyn Cardona L. Emiel Kielty, D.O. [facility] [address], [place] [address] Cell Phone (Mon-Fri 8am-5pm):  [phone] On Call:  [phone] & follow prompts after 5pm & weekends Office Phone:   [phone] Office Fax:  [phone]

## 2020-09-24 ENCOUNTER — Encounter: Payer: Self-pay | Admitting: Internal Medicine

## 2020-10-04 ENCOUNTER — Other Ambulatory Visit: Payer: Self-pay | Admitting: Nurse Practitioner

## 2020-10-04 DIAGNOSIS — E782 Mixed hyperlipidemia: Secondary | ICD-10-CM

## 2020-10-04 DIAGNOSIS — E039 Hypothyroidism, unspecified: Secondary | ICD-10-CM

## 2020-10-09 ENCOUNTER — Other Ambulatory Visit: Payer: Self-pay | Admitting: Internal Medicine

## 2020-10-09 DIAGNOSIS — F5104 Psychophysiologic insomnia: Secondary | ICD-10-CM

## 2020-10-10 NOTE — Telephone Encounter (Signed)
Patient has request a refill on medication "Mirtazapine" and "Trazodone". Patient last refill on both medications was 07/05/2020. I attempted to send medications into pharmacy but warnings came up. Medications pend and sent to PCP Lauree Chandler, NP .

## 2020-11-06 ENCOUNTER — Ambulatory Visit: Payer: HMO

## 2020-11-13 ENCOUNTER — Encounter: Payer: Self-pay | Admitting: Nurse Practitioner

## 2020-11-13 ENCOUNTER — Ambulatory Visit (INDEPENDENT_AMBULATORY_CARE_PROVIDER_SITE_OTHER): Payer: HMO | Admitting: Nurse Practitioner

## 2020-11-13 ENCOUNTER — Other Ambulatory Visit: Payer: Self-pay

## 2020-11-13 ENCOUNTER — Telehealth: Payer: Self-pay

## 2020-11-13 DIAGNOSIS — Z0000 Encounter for general adult medical examination without abnormal findings: Secondary | ICD-10-CM

## 2020-11-13 NOTE — Telephone Encounter (Signed)
[name], Miranda [name] are scheduled for a virtual visit with your provider today.    Just as we do with appointments in the office, we must obtain your consent to participate.  Your consent will be active for this visit and any virtual visit you may have with one of our providers in the next 365 days.    If you have a MyChart account, I can also send a copy of this consent to you electronically.  All virtual visits are billed to your insurance company just like a traditional visit in the office.  As this is a virtual visit, video technology does not allow for your provider to perform a traditional examination.  This may limit your provider's ability to fully assess your condition.  If your provider identifies any concerns that need to be evaluated in person or the need to arrange testing such as labs, EKG, etc, we will make arrangements to do so.    Although advances in technology are sophisticated, we cannot ensure that it will always work on either your end or our end.  If the connection with a video visit is poor, we may have to switch to a telephone visit.  With either a video or telephone visit, we are not always able to ensure that we have a secure connection.   I need to obtain your verbal consent now.   Are you willing to proceed with your visit today?   Miranda [name] has provided verbal consent on 11/13/2020 for a virtual visit (video or telephone).   Carroll Kinds, [facility] 11/13/2020  9:43 AM

## 2020-11-13 NOTE — Progress Notes (Signed)
This service is provided via telemedicine  No vital signs collected/recorded due to the encounter was a telemedicine visit.   Location of patient (ex: home, work): Home  Patient consents to a telephone visit:  Yes see encounter dated 11/13/2020  Location of the provider (ex: office, home):  [place]  Name of any referring provider: N/A  Names of all persons participating in the telemedicine service and their role in the encounter:  Sherrie Mustache, Nurse Practitioner, Carroll Kinds, CMA, and patient.   Time spent on call:  8 minutes with medical assistant

## 2020-11-13 NOTE — Progress Notes (Signed)
Subjective:   Lilian Witkop is a 55 y.o. female who presents for Medicare Annual (Subsequent) preventive examination.  Review of Systems     Cardiac Risk Factors include: dyslipidemia;hypertension;family history of premature cardiovascular disease;sedentary lifestyle     Objective:    There were no vitals filed for this visit. There is no height or weight on file to calculate BMI.  Advanced Directives 11/13/2020 09/03/2020 11/22/2019 11/11/2019 10/13/2019 03/15/2019 09/08/2018  Does Patient Have a Medical Advance Directive? No No No No No No No  Does patient want to make changes to medical advance directive? - - - - - - -  Would patient like information on creating a medical advance directive? No - Patient declined No - Patient declined No - Patient declined Yes (MAU/Ambulatory/Procedural Areas - Information given) No - Patient declined No - Patient declined No - Patient declined  Pre-existing out of facility DNR order (yellow form or pink MOST form) - - - - - - -    Current Medications (verified) Outpatient Encounter Medications as of 11/13/2020  Medication Sig  . aspirin EC 81 MG EC tablet Take 1 tablet (81 mg total) by mouth daily.  Marland Kitchen buPROPion (WELLBUTRIN XL) 150 MG 24 hr tablet Take 1 tablet by mouth once daily  . carvedilol (COREG) 6.25 MG tablet Take 1 tablet (6.25 mg total) by mouth 2 (two) times daily.  . cholecalciferol (VITAMIN D3) 25 MCG (1000 UNIT) tablet Take 1,000 Units by mouth daily.  . diclofenac sodium (VOLTAREN) 1 % GEL Apply 4 g topically as needed.  . EUTHYROX 100 MCG tablet TAKE 1 TABLET BY MOUTH ONCE DAILY BEFORE BREAKFAST  . ferrous sulfate 325 (65 FE) MG EC tablet Take 1 tablet (325 mg total) by mouth 2 (two) times daily.  . fluticasone (FLONASE) 50 MCG/ACT nasal spray USE 1 SPRAY(S) IN EACH NOSTRIL ONCE DAILY AS NEEDED FOR ALLERGIES OR RHINITIS  . furosemide (LASIX) 40 MG tablet Take 1 tablet (40 mg total) by mouth daily.  Marland Kitchen lisinopril (ZESTRIL) 5 MG tablet  Take 1 tablet (5 mg total) by mouth at bedtime.  . mirtazapine (REMERON) 15 MG tablet TAKE 1 TABLET BY MOUTH ONCE DAILY AT BEDTIME  . traZODone (DESYREL) 150 MG tablet TAKE 1 TABLET BY MOUTH ONCE DAILY AT BEDTIME  . rosuvastatin (CRESTOR) 40 MG tablet Take 1 tablet (40 mg total) by mouth daily.   No facility-administered encounter medications on file as of 11/13/2020.    Allergies (verified) Codeine   History: Past Medical History:  Diagnosis Date  . Anemia   . Anxiety   . At risk for sudden cardiac death 2012-11-04  . CHF - Combined Systolic + Diastolic Dysfunction. EF of 15-20% with Grade II diastolic dysfunction on echo 10/27/12 10/28/2012  . Coronary artery disease   . Depression   . Dyspnea   . Fibroids   . Heart murmur   . Hypertension   . Hypothyroidism   . S/P cardiac catheterization, 11/04/2012, normal coronaries with minimal luminal irregularities in RCA system 10/30/2012  . Thyroid disease    hypothyroidism   Past Surgical History:  Procedure Laterality Date  . AORTIC VALVE REPLACEMENT N/A 03/12/2017   Procedure: AORTIC VALVE REPLACEMENT (AVR) with Mitral Annuloplasty;  Surgeon: Gaye Pollack, MD;  Location: [facility] OR;  Service: Open Heart Surgery;  Laterality: N/A;  . LEFT AND RIGHT HEART CATHETERIZATION WITH CORONARY ANGIOGRAM N/A 11-04-2012   Procedure: LEFT AND RIGHT HEART CATHETERIZATION WITH CORONARY ANGIOGRAM;  Surgeon: Leonie Man,  MD;  Location: [facility] CATH LAB;  Service: Cardiovascular;  Laterality: N/A;  . MITRAL VALVE REPAIR N/A 03/12/2017   Procedure: MITRAL VALVE REPAIR (MVR);  Surgeon: Gaye Pollack, MD;  Location: [facility];  Service: Open Heart Surgery;  Laterality: N/A;  mitral annuloplasty  . MULTIPLE EXTRACTIONS WITH ALVEOLOPLASTY N/A 01/21/2017   Procedure: Extraction of tooth #'s 4-13 with alveoloplasty;  Surgeon: Lenn Cal, DDS;  Location: [facility];  Service: Oral Surgery;  Laterality: N/A;  . RIGHT/LEFT HEART CATH AND CORONARY ANGIOGRAPHY N/A 01/09/2017    Procedure: Right/Left Heart Cath and Coronary Angiography;  Surgeon: Troy Sine, MD;  Location: [place] CV LAB;  Service: Cardiovascular;  Laterality: N/A;  . TEE WITHOUT CARDIOVERSION N/A 03/12/2017   Procedure: TRANSESOPHAGEAL ECHOCARDIOGRAM (TEE);  Surgeon: Gaye Pollack, MD;  Location: [facility];  Service: Open Heart Surgery;  Laterality: N/A;   Family History  Problem Relation Age of Onset  . Bone cancer Paternal Grandfather   . Hypertension Mother   . Liver disease Mother        autoimmune  . Heart disease Mother   . Colon polyps Mother   . Stroke Father   . Hypertension Father   . Hypertension Maternal Grandmother   . Heart disease Maternal Grandmother   . Breast cancer Cousin   . Bone cancer Maternal Uncle   . Arthritis Paternal Uncle    Social History   Socioeconomic History  . Marital status: Single    Spouse name: Not on file  . Number of children: 1  . Years of education: 12th  . Highest education level: Not on file  Occupational History  . Occupation: unemployed    Employer: [employer]  Tobacco Use  . Smoking status: Former Smoker    Packs/day: 0.50    Years: 5.00    Pack years: 2.50    Types: Cigars, Cigarettes    Quit date: 08/04/1978    Years since quitting: 42.3  . Smokeless tobacco: Never Used  . Tobacco comment: 1 cigar daily-quit 10/24/12    Vaping Use  . Vaping Use: Never used  Substance and Sexual Activity  . Alcohol use: Yes    Alcohol/week: 3.0 standard drinks    Types: 3 Glasses of wine per week    Comment: per month  . Drug use: Not Currently    Frequency: 5.0 times per week    Types: Marijuana    Comment: few weeks ago last  . Sexual activity: Not Currently  Other Topics Concern  . Not on file  Social History Narrative   Patient lives at home alone. (Condo)   Caffeine use: 1/2 soda daily   Diet   Martial status:single   Is it one or more stories? Yes   How many persons live in your home? 1   Do you have any pets in  your home? No   Current or past profession: Biochemist, clinical   Do you exercise? No   Do you have a living will? No    Do you have a DNR form? No    Do you have signed POA/HPOA forms? No    Social Determinants of Radio broadcast assistant Strain: Not on file  Food Insecurity: No Food Insecurity  . Worried About Charity fundraiser in the Last Year: Never true  . Ran Out of Food in the Last Year: Never true  Transportation Needs: No Transportation Needs  . Lack of Transportation (Medical): No  .  Lack of Transportation (Non-Medical): No  Physical Activity: Not on file  Stress: Not on file  Social Connections: Not on file    Tobacco Counseling Counseling given: Not Answered Comment: 1 cigar daily-quit 10/24/12     Clinical Intake:  Pre-visit preparation completed: Yes  Pain : No/denies pain     BMI - recorded: 23 Nutritional Risks: None Diabetes: No  How often do you need to have someone help you when you read instructions, pamphlets, or other written materials from your doctor or pharmacy?: 1 - Never  Diabetic?no         Activities of Daily Living In your present state of health, do you have any difficulty performing the following activities: 11/13/2020 11/22/2019  Hearing? N N  Vision? N N  Difficulty concentrating or making decisions? N N  Walking or climbing stairs? N N  Dressing or bathing? N N  Doing errands, shopping? N N  Preparing Food and eating ? N N  Using the Toilet? N N  In the past six months, have you accidently leaked urine? Y N  Do you have problems with loss of bowel control? N N  Managing your Medications? N N  Managing your Finances? N N  Housekeeping or managing your Housekeeping? N N  Some recent data might be hidden    Patient Care Team: Lauree Chandler, NP as PCP - General (Geriatric Medicine) Jerline Pain, MD as PCP - Cardiology (Cardiology) Emily Filbert, MD (Inactive) as Consulting Physician (Obstetrics and  Gynecology) Haroldine Laws, Shaune Pascal, MD as Consulting Physician (Cardiology) Penni Bombard, MD as Consulting Physician (Neurology)  Indicate any recent Medical Services you may have received from other than [facility] providers in the past year (date may be approximate).     Assessment:   This is a routine wellness examination for Annelise.  Hearing/Vision screen  Hearing Screening   125Hz  250Hz  500Hz  1000Hz  2000Hz  3000Hz  4000Hz  6000Hz  8000Hz   Right ear:           Left ear:           Comments: Patient has no hearing problems  Vision Screening Comments: Patient has no vision problems. Patient has not had a recent eye exam. Patient was seeing Dr. Katy Fitch  Dietary issues and exercise activities discussed: Current Exercise Habits: Home exercise routine, Type of exercise: walking, Time (Minutes): 20, Frequency (Times/Week): 3, Weekly Exercise (Minutes/Week): 60  Goals    .  maintain lifestyle (pt-stated)      Starting 11/03/2016 I would like to maintain my lifestyle. Encouraged to continue to fish and remain active    .  Patient Stated       11/11/19 To stay out of the hospital       Depression Screen PHQ 2/9 Scores 11/13/2020 09/03/2020 11/22/2019 11/11/2019 07/18/2019 03/15/2019 02/07/2019  PHQ - 2 Score 0 0 0 0 0 0 0    Fall Risk Fall Risk  11/13/2020 09/03/2020 11/11/2019 10/13/2019 02/07/2019  Falls in the past year? 0 0 0 - 0  Number falls in past yr: 0 0 0 0 0  Injury with Fall? 0 0 0 0 0    FALL RISK PREVENTION PERTAINING TO THE HOME:  Any stairs in or around the home? yes If so, are there any without handrails? No  Home free of loose throw rugs in walkways, pet beds, electrical cords, etc? Yes  Adequate lighting in your home to reduce risk of falls? Yes   ASSISTIVE DEVICES UTILIZED TO PREVENT  FALLS:  Life alert? No  Use of a cane, walker or w/c? No  Grab bars in the bathroom? Yes  Shower chair or bench in shower? No  Elevated toilet seat or a handicapped toilet? No   TIMED UP AND  GO:  Was the test performed? No .   Cognitive Function: MMSE - Mini Mental State Exam 11/03/2016  Not completed: Unable to complete     6CIT Screen 11/13/2020 11/11/2019 11/09/2018  What Year? 0 points 0 points 0 points  What month? 0 points 0 points 0 points  What time? 0 points 0 points 0 points  Count back from 20 0 points 0 points 0 points  Months in reverse 0 points 0 points 0 points  Repeat phrase 0 points 0 points 4 points  Total Score 0 0 4    Immunizations Immunization History  Administered Date(s) Administered  . Hep A / Hep B 09/08/2017, 10/09/2017, 03/11/2018  . Influenza, High Dose Seasonal PF 05/24/2018  . Influenza,inj,Quad PF,6+ Mos 05/26/2016, 05/07/2017, 06/13/2019, 04/19/2020  . PFIZER(Purple Top)SARS-COV-2 Vaccination 10/20/2019, 11/10/2019, 05/26/2020  . Pneumococcal Conjugate-13 12/14/2014  . Pneumococcal Polysaccharide-23 01/30/2017  . Tdap 01/25/2016  . Zoster Recombinat (Shingrix) 11/06/2017, 01/28/2018    TDAP status: Up to date  Flu Vaccine status: Up to date  Pneumococcal vaccine status: Up to date  Covid-19 vaccine status: Completed vaccines  Qualifies for Shingles Vaccine? Yes   Zostavax completed No   Shingrix Completed?: Yes  Screening Tests Health Maintenance  Topic Date Due  . INFLUENZA VACCINE  03/04/2021  . MAMMOGRAM  09/01/2021  . PAP SMEAR-Modifier  06/12/2022  . Fecal DNA (Cologuard)  09/14/2022  . TETANUS/TDAP  01/24/2026  . COVID-19 Vaccine  Completed  . Hepatitis C Screening  Completed  . HIV Screening  Completed  . HPV VACCINES  Aged Out    Health Maintenance  There are no preventive care reminders to display for this patient.  Colorectal cancer screening: Type of screening: Cologuard. Completed 2021. Repeat every 3 years  Mammogram status: Completed 09/02/19. Repeat every year  Bone Density status: Completed once she is 23.   Lung Cancer Screening: (Low Dose CT Chest recommended if Age 69-80 years, 30 pack-year  currently smoking OR have quit w/in 15years.) does not qualify.     Additional Screening:  Hepatitis C Screening: does  qualify; Completed  Vision Screening: Recommended annual ophthalmology exams for early detection of glaucoma and other disorders of the eye. Is the patient up to date with their annual eye exam?  No  Who is the provider or what is the name of the office in which the patient attends annual eye exams? [facility] If pt is not established with a provider, would they like to be referred to a provider to establish care? No .   Dental Screening: Recommended annual dental exams for proper oral hygiene  Community Resource Referral / Chronic Care Management: CRR required this visit?  No   CCM required this visit?  No      Plan:     I have personally reviewed and noted the following in the patient's chart:   . Medical and social history . Use of alcohol, tobacco or illicit drugs  . Current medications and supplements . Functional ability and status . Nutritional status . Physical activity . Advanced directives . List of other physicians . Hospitalizations, surgeries, and ER visits in previous 12 months . Vitals . Screenings to include cognitive, depression, and falls . Referrals and appointments  In addition, I have reviewed and discussed with patient certain preventive protocols, quality metrics, and best practice recommendations. A written personalized care plan for preventive services as well as general preventive health recommendations were provided to patient.     Lauree Chandler, NP   11/13/2020    Virtual Visit via Telephone Note  I connected with@ on 11/13/20 at  9:30 AM EDT by telephone and verified that I am speaking with the correct person using two identifiers.  Location: Patient: home Provider: [facility]    I discussed the limitations, risks, security and privacy concerns of performing an evaluation and management service by telephone and the  availability of in person appointments. I also discussed with the patient that there may be a patient responsible charge related to this service. The patient expressed understanding and agreed to proceed.   I discussed the assessment and treatment plan with the patient. The patient was provided an opportunity to ask questions and all were answered. The patient agreed with the plan and demonstrated an understanding of the instructions.   The patient was advised to call back or seek an in-person evaluation if the symptoms worsen or if the condition fails to improve as anticipated.  I provided 18 minutes of non-face-to-face time during this encounter.  Carlos American. Harle Battiest Avs printed and mailed

## 2020-11-13 NOTE — Patient Instructions (Signed)
Miranda [name] , Thank you for taking time to come for your Medicare Wellness Visit. I appreciate your ongoing commitment to your health goals. Please review the following plan we discussed and let me know if I can assist you in the future.   Screening recommendations/referrals: Colonoscopy up to date  Mammogram due-- recommend to schedule appt Bone Density due at 64 Recommended yearly ophthalmology/optometry visit for glaucoma screening and checkup Recommended yearly dental visit for hygiene and checkup  Vaccinations:  Influenza vaccine up to date Pneumococcal vaccine up to date Tdap vaccine up tod ate  Shingles vaccine up to date    Advanced directives: recommended to review MOST form, we can put in chart together at next visit if you are interested   Conditions/risks identified: cardiovascular disease  Next appointment: yearly for AWV   Preventive Care 57 Years and Older, Female Preventive care refers to lifestyle choices and visits with your health care provider that can promote health and wellness. What does preventive care include?  A yearly physical exam. This is also called an annual well check.  Dental exams once or twice a year.  Routine eye exams. Ask your health care provider how often you should have your eyes checked.  Personal lifestyle choices, including:  Daily care of your teeth and gums.  Regular physical activity.  Eating a healthy diet.  Avoiding tobacco and drug use.  Limiting alcohol use.  Practicing safe sex.  Taking low-dose aspirin every day.  Taking vitamin and mineral supplements as recommended by your health care provider. What happens during an annual well check? The services and screenings done by your health care provider during your annual well check will depend on your age, overall health, lifestyle risk factors, and family history of disease. Counseling  Your health care provider may ask you questions about your:  Alcohol  use.  Tobacco use.  Drug use.  Emotional well-being.  Home and relationship well-being.  Sexual activity.  Eating habits.  History of falls.  Memory and ability to understand (cognition).  Work and work Statistician.  Reproductive health. Screening  You may have the following tests or measurements:  Height, weight, and BMI.  Blood pressure.  Lipid and cholesterol levels. These may be checked every 5 years, or more frequently if you are over 48 years old.  Skin check.  Lung cancer screening. You may have this screening every year starting at age 57 if you have a 30-pack-year history of smoking and currently smoke or have quit within the past 15 years.  Fecal occult blood test (FOBT) of the stool. You may have this test every year starting at age 57.  Flexible sigmoidoscopy or colonoscopy. You may have a sigmoidoscopy every 5 years or a colonoscopy every 10 years starting at age 57.  Hepatitis C blood test.  Hepatitis B blood test.  Sexually transmitted disease (STD) testing.  Diabetes screening. This is done by checking your blood sugar (glucose) after you have not eaten for a while (fasting). You may have this done every 1-3 years.  Bone density scan. This is done to screen for osteoporosis. You may have this done starting at age 57.  Mammogram. This may be done every 1-2 years. Talk to your health care provider about how often you should have regular mammograms. Talk with your health care provider about your test results, treatment options, and if necessary, the need for more tests. Vaccines  Your health care provider may recommend certain vaccines, such as:  Influenza vaccine.  This is recommended every year.  Tetanus, diphtheria, and acellular pertussis (Tdap, Td) vaccine. You may need a Td booster every 10 years.  Zoster vaccine. You may need this after age 67.  Pneumococcal 13-valent conjugate (PCV13) vaccine. One dose is recommended after age  57.  Pneumococcal polysaccharide (PPSV23) vaccine. One dose is recommended after age 57. Talk to your health care provider about which screenings and vaccines you need and how often you need them. This information is not intended to replace advice given to you by your health care provider. Make sure you discuss any questions you have with your health care provider. Document Released: 08/17/2015 Document Revised: 04/09/2016 Document Reviewed: 05/22/2015 Elsevier Interactive Patient Education  2017 [employer] Prevention in the Home Falls can cause injuries. They can happen to people of all ages. There are many things you can do to make your home safe and to help prevent falls. What can I do on the outside of my home?  Regularly fix the edges of walkways and driveways and fix any cracks.  Remove anything that might make you trip as you walk through a door, such as a raised step or threshold.  Trim any bushes or trees on the path to your home.  Use bright outdoor lighting.  Clear any walking paths of anything that might make someone trip, such as rocks or tools.  Regularly check to see if handrails are loose or broken. Make sure that both sides of any steps have handrails.  Any raised decks and porches should have guardrails on the edges.  Have any leaves, snow, or ice cleared regularly.  Use sand or salt on walking paths during winter.  Clean up any spills in your garage right away. This includes oil or grease spills. What can I do in the bathroom?  Use night lights.  Install grab bars by the toilet and in the tub and shower. Do not use towel bars as grab bars.  Use non-skid mats or decals in the tub or shower.  If you need to sit down in the shower, use a plastic, non-slip stool.  Keep the floor dry. Clean up any water that spills on the floor as soon as it happens.  Remove soap buildup in the tub or shower regularly.  Attach bath mats securely with double-sided  non-slip rug tape.  Do not have throw rugs and other things on the floor that can make you trip. What can I do in the bedroom?  Use night lights.  Make sure that you have a light by your bed that is easy to reach.  Do not use any sheets or blankets that are too big for your bed. They should not hang down onto the floor.  Have a firm chair that has side arms. You can use this for support while you get dressed.  Do not have throw rugs and other things on the floor that can make you trip. What can I do in the kitchen?  Clean up any spills right away.  Avoid walking on wet floors.  Keep items that you use a lot in easy-to-reach places.  If you need to reach something above you, use a strong step stool that has a grab bar.  Keep electrical cords out of the way.  Do not use floor polish or wax that makes floors slippery. If you must use wax, use non-skid floor wax.  Do not have throw rugs and other things on the floor that can make  you trip. What can I do with my stairs?  Do not leave any items on the stairs.  Make sure that there are handrails on both sides of the stairs and use them. Fix handrails that are broken or loose. Make sure that handrails are as long as the stairways.  Check any carpeting to make sure that it is firmly attached to the stairs. Fix any carpet that is loose or worn.  Avoid having throw rugs at the top or bottom of the stairs. If you do have throw rugs, attach them to the floor with carpet tape.  Make sure that you have a light switch at the top of the stairs and the bottom of the stairs. If you do not have them, ask someone to add them for you. What else can I do to help prevent falls?  Wear shoes that:  Do not have high heels.  Have rubber bottoms.  Are comfortable and fit you well.  Are closed at the toe. Do not wear sandals.  If you use a stepladder:  Make sure that it is fully opened. Do not climb a closed stepladder.  Make sure that both  sides of the stepladder are locked into place.  Ask someone to hold it for you, if possible.  Clearly mark and make sure that you can see:  Any grab bars or handrails.  First and last steps.  Where the edge of each step is.  Use tools that help you move around (mobility aids) if they are needed. These include:  Canes.  Walkers.  Scooters.  Crutches.  Turn on the lights when you go into a dark area. Replace any light bulbs as soon as they burn out.  Set up your furniture so you have a clear path. Avoid moving your furniture around.  If any of your floors are uneven, fix them.  If there are any pets around you, be aware of where they are.  Review your medicines with your doctor. Some medicines can make you feel dizzy. This can increase your chance of falling. Ask your doctor what other things that you can do to help prevent falls. This information is not intended to replace advice given to you by your health care provider. Make sure you discuss any questions you have with your health care provider. Document Released: 05/17/2009 Document Revised: 12/27/2015 Document Reviewed: 08/25/2014 Elsevier Interactive Patient Education  2017 [employer].

## 2020-11-16 ENCOUNTER — Ambulatory Visit: Payer: HMO | Admitting: Cardiology

## 2020-12-07 ENCOUNTER — Other Ambulatory Visit: Payer: Self-pay

## 2020-12-07 ENCOUNTER — Ambulatory Visit (INDEPENDENT_AMBULATORY_CARE_PROVIDER_SITE_OTHER): Payer: HMO | Admitting: Cardiology

## 2020-12-07 ENCOUNTER — Encounter: Payer: Self-pay | Admitting: Cardiology

## 2020-12-07 VITALS — BP 128/80 | HR 82 | Ht 65.0 in | Wt 138.2 lb

## 2020-12-07 DIAGNOSIS — Z9889 Other specified postprocedural states: Secondary | ICD-10-CM | POA: Diagnosis not present

## 2020-12-07 DIAGNOSIS — Z952 Presence of prosthetic heart valve: Secondary | ICD-10-CM | POA: Diagnosis not present

## 2020-12-07 DIAGNOSIS — I428 Other cardiomyopathies: Secondary | ICD-10-CM

## 2020-12-07 DIAGNOSIS — I5042 Chronic combined systolic (congestive) and diastolic (congestive) heart failure: Secondary | ICD-10-CM | POA: Diagnosis not present

## 2020-12-07 NOTE — Patient Instructions (Signed)

## 2020-12-07 NOTE — Progress Notes (Signed)
Cardiology Office Note:    Date:  12/07/2020   ID:  Miranda [name], DOB 07/22/1966, MRN [id number]  PCP:  Lauree Chandler, NP   [facility] Providers Cardiologist:  Candee Furbish, MD     Referring MD: Gayland Curry, DO    History of Present Illness:    Miranda [name] is a 55 y.o. female here for follow up of hypertension, CHF, and AR.  Prior Visit:  She had nonischemic cardiomyopathy, prior EF 15%, now normal. Had aortic valve replacement as well as mitral valve repair. She has hadAVR with Dr. Cyndia Bent on 03/12/2017 with23 mm Edwards Magna-Ease pericardial valve and MV annuloplasty with 28 mm Sorin 3D MEMO ringafter presenting with acute heart failure/severe AI.  Today, she is doing well. She is still having shortness of breath when climbing her stairs but this is her baseline. She is tolerating her medications and has no new complaints. Her weight has been stabled. She participates in physical activity but no formal exercise. She denies exertional chest pain, tightness, or pressure. She has no lightheadedness, orthopnea, PND, or headaches. She denies any presyncopal or syncopal episodes.   Past Medical History:  Diagnosis Date  . Anemia   . Anxiety   . At risk for sudden cardiac death 2012-11-03  . CHF - Combined Systolic + Diastolic Dysfunction. EF of 15-20% with Grade II diastolic dysfunction on echo 10/27/12 10/28/2012  . Coronary artery disease   . Depression   . Dyspnea   . Fibroids   . Heart murmur   . Hypertension   . Hypothyroidism   . S/P cardiac catheterization, 11-03-12, normal coronaries with minimal luminal irregularities in RCA system 10/30/2012  . Thyroid disease    hypothyroidism    Past Surgical History:  Procedure Laterality Date  . AORTIC VALVE REPLACEMENT N/A 03/12/2017   Procedure: AORTIC VALVE REPLACEMENT (AVR) with Mitral Annuloplasty;  Surgeon: Gaye Pollack, MD;  Location: [facility] OR;  Service: Open Heart Surgery;  Laterality: N/A;  . LEFT AND RIGHT  HEART CATHETERIZATION WITH CORONARY ANGIOGRAM N/A 11/03/2012   Procedure: LEFT AND RIGHT HEART CATHETERIZATION WITH CORONARY ANGIOGRAM;  Surgeon: Leonie Man, MD;  Location: [facility] CATH LAB;  Service: Cardiovascular;  Laterality: N/A;  . MITRAL VALVE REPAIR N/A 03/12/2017   Procedure: MITRAL VALVE REPAIR (MVR);  Surgeon: Gaye Pollack, MD;  Location: [place];  Service: Open Heart Surgery;  Laterality: N/A;  mitral annuloplasty  . MULTIPLE EXTRACTIONS WITH ALVEOLOPLASTY N/A 01/21/2017   Procedure: Extraction of tooth #'s 4-13 with alveoloplasty;  Surgeon: Lenn Cal, DDS;  Location: [place];  Service: Oral Surgery;  Laterality: N/A;  . RIGHT/LEFT HEART CATH AND CORONARY ANGIOGRAPHY N/A 01/09/2017   Procedure: Right/Left Heart Cath and Coronary Angiography;  Surgeon: Troy Sine, MD;  Location: [facility] CV LAB;  Service: Cardiovascular;  Laterality: N/A;  . TEE WITHOUT CARDIOVERSION N/A 03/12/2017   Procedure: TRANSESOPHAGEAL ECHOCARDIOGRAM (TEE);  Surgeon: Gaye Pollack, MD;  Location: [place];  Service: Open Heart Surgery;  Laterality: N/A;    Current Medications: Current Meds  Medication Sig  . aspirin EC 81 MG EC tablet Take 1 tablet (81 mg total) by mouth daily.  Marland Kitchen buPROPion (WELLBUTRIN XL) 150 MG 24 hr tablet Take 1 tablet by mouth once daily  . carvedilol (COREG) 6.25 MG tablet Take 1 tablet (6.25 mg total) by mouth 2 (two) times daily.  . cholecalciferol (VITAMIN D3) 25 MCG (1000 UNIT) tablet Take 1,000 Units by mouth daily.  Marland Kitchen  diclofenac sodium (VOLTAREN) 1 % GEL Apply 4 g topically as needed.  . EUTHYROX 100 MCG tablet TAKE 1 TABLET BY MOUTH ONCE DAILY BEFORE BREAKFAST  . ferrous sulfate 325 (65 FE) MG EC tablet Take 1 tablet (325 mg total) by mouth 2 (two) times daily.  . fluticasone (FLONASE) 50 MCG/ACT nasal spray USE 1 SPRAY(S) IN EACH NOSTRIL ONCE DAILY AS NEEDED FOR ALLERGIES OR RHINITIS  . furosemide (LASIX) 40 MG tablet Take 1 tablet (40 mg total) by mouth daily.  Marland Kitchen  lisinopril (ZESTRIL) 5 MG tablet Take 1 tablet (5 mg total) by mouth at bedtime.  . mirtazapine (REMERON) 15 MG tablet TAKE 1 TABLET BY MOUTH ONCE DAILY AT BEDTIME  . rosuvastatin (CRESTOR) 40 MG tablet Take 1 tablet (40 mg total) by mouth daily.  . traZODone (DESYREL) 150 MG tablet TAKE 1 TABLET BY MOUTH ONCE DAILY AT BEDTIME     Allergies:   Codeine   Social History   Socioeconomic History  . Marital status: Single    Spouse name: Not on file  . Number of children: 1  . Years of education: 12th  . Highest education level: Not on file  Occupational History  . Occupation: unemployed    Employer: [employer]  Tobacco Use  . Smoking status: Former Smoker    Packs/day: 0.50    Years: 5.00    Pack years: 2.50    Types: Cigars, Cigarettes    Quit date: 08/04/1978    Years since quitting: 42.3  . Smokeless tobacco: Never Used  . Tobacco comment: 1 cigar daily-quit 10/24/12    Vaping Use  . Vaping Use: Never used  Substance and Sexual Activity  . Alcohol use: Yes    Alcohol/week: 3.0 standard drinks    Types: 3 Glasses of wine per week    Comment: per month  . Drug use: Not Currently    Frequency: 5.0 times per week    Types: Marijuana    Comment: few weeks ago last  . Sexual activity: Not Currently  Other Topics Concern  . Not on file  Social History Narrative   Patient lives at home alone. (Condo)   Caffeine use: 1/2 soda daily   Diet   Martial status:single   Is it one or more stories? Yes   How many persons live in your home? 1   Do you have any pets in your home? No   Current or past profession: Biochemist, clinical   Do you exercise? No   Do you have a living will? No    Do you have a DNR form? No    Do you have signed POA/HPOA forms? No    Social Determinants of Radio broadcast assistant Strain: Not on file  Food Insecurity: Not on file  Transportation Needs: Not on file  Physical Activity: Not on file  Stress: Not on file  Social Connections: Not on  file     Family History: The patient's family history includes Arthritis in her paternal uncle; Bone cancer in her maternal uncle and paternal grandfather; Breast cancer in her cousin; Colon polyps in her mother; Heart disease in her maternal grandmother and mother; Hypertension in her father, maternal grandmother, and mother; Liver disease in her mother; Stroke in her father.  ROS:   Please see the history of present illness.     All other systems reviewed and are negative.  EKGs/Labs/Other Studies Reviewed:    The following studies were reviewed today: ECHO(05/14/20)0- 1.  Left ventricular ejection fraction, by estimation, is 60 to 65%. The left ventricle has normal function. The left ventricle has no regional wall motion abnormalities. There is moderate left ventricular hypertrophy. Left ventricular diastolic parameters are consistent with Grade II diastolic dysfunction (pseudonormalization). Elevated left atrial pressure. 2. Right ventricular systolic function is mildly reduced. The right ventricular size is normal. There is normal pulmonary artery systolic pressure. The estimated right ventricular systolic pressure is 95.1 mmHg. 3. Left atrial size was severely dilated. 4. The mitral valve has been repaired/replaced. There is a 28 mm prosthetic annuloplasty ring present in the mitral position. Mild to moderate mitral valve regurgitation. Mild mitral stenosis. MG 4 mmHg at HR 62 bpm, MVA 1.9 cm^2 by continuity equation. 5. The aortic valve has been repaired/replaced. There is a 23 mm bioprosthetic valve present in the aortic position. Aortic valve regurgitation is not visualized. Echo findings are consistent with normal structure and function of the aortic valve prosthesis. MG 9 mmHg, stable from prior echo on 10/19/17. 6. Aortic dilatation noted. There is mild dilatation of the aortic root, measuring 42 mm. There is mild dilatation of the ascending aorta, measuring 40 mm. 7. The  inferior vena cava is normal in size with greater than 50% respiratory variability, suggesting right atrial pressure of 3 mmHg.   Echocardiogram 2019 EF 50 to 55%    Recent Labs: 08/29/2020: ALT 12; BUN 16; Creat 1.02; Hemoglobin 13.7; Platelets 145; Potassium 3.8; Sodium 138; TSH 8.36  Recent Lipid Panel    Component Value Date/Time   CHOL 169 08/29/2020 0806   CHOL 186 07/04/2020 0920   TRIG 67 08/29/2020 0806   HDL 57 08/29/2020 0806   HDL 56 07/04/2020 0920   CHOLHDL 3.0 08/29/2020 0806   VLDL 11 09/26/2016 1019   LDLCALC 97 08/29/2020 0806     Risk Assessment/Calculations:      Physical Exam:    VS:  BP 128/80   Pulse 82   Ht 5\' 5"  (1.651 m)   Wt 138 lb 3.2 oz (62.7 kg)   LMP 01/17/2015   SpO2 98%   BMI 23.00 kg/m     Wt Readings from Last 3 Encounters:  12/07/20 138 lb 3.2 oz (62.7 kg)  09/03/20 140 lb 12.8 oz (63.9 kg)  07/04/20 136 lb 3.2 oz (61.8 kg)     GEN: Well nourished, well developed in no acute distress HEENT: Normal NECK: No JVD; No carotid bruits LYMPHATICS: No lymphadenopathy CARDIAC: RRR, no murmurs, rubs, gallops RESPIRATORY:  Clear to auscultation without rales, wheezing or rhonchi  ABDOMEN: Soft, non-tender, non-distended MUSCULOSKELETAL:  No edema; No deformity  SKIN: Warm and dry NEUROLOGIC:  Alert and oriented x 3 PSYCHIATRIC:  Normal affect   ASSESSMENT:    1. Aortic valve replaced   2. S/P mitral valve repair   3. Chronic combined systolic and diastolic congestive heart failure ([place])   4. NICM (nonischemic cardiomyopathy) ([place])    PLAN:    In order of problems listed above:  Chronic systolic and diastolic heart failure - Could not afford Entresto.  Challenging to increase medications.  Continue with carvedilol 6.25 mg twice a day, Lasix 40 mg a day, lisinopril 5 mg a day.  No changes made.  Last creatinine 1.37, hemoglobin 14, hemoglobin A1c 8.3.  Diabetes with hypertension - Hemoglobin A1c 8.3.  Continue to work with  this per primary team.  Aortic valve replacement, mitral valve repair - Dr. Cyndia Bent.  Doing well.  Echocardiogram 2021 reviewed.  No  need for echocardiogram this year.  Stable.  Minimal murmur heard on exam.  Nonobstructive coronary artery disease - Doing well no symptoms Crestor aspirin 81.  Dilated aorta - Continue with good blood pressure medication aortic root was 42 mm previously.  Continue to monitor with echocardiogram   Follow up in 6 months   Medication Adjustments/Labs and Tests Ordered: Current medicines are reviewed at length with the patient today.  Concerns regarding medicines are outlined above.  No orders of the defined types were placed in this encounter.  No orders of the defined types were placed in this encounter.   Patient Instructions  Medication Instructions:  The current medical regimen is effective;  continue present plan and medications.  *If you need a refill on your cardiac medications before your next appointment, please call your pharmacy*  Follow-Up: At [facility], you and your health needs are our priority.  As part of our continuing mission to provide you with exceptional heart care, we have created designated Provider Care Teams.  These Care Teams include your primary Cardiologist (physician) and Advanced Practice Providers (APPs -  Physician Assistants and Nurse Practitioners) who all work together to provide you with the care you need, when you need it.  We recommend signing up for the patient portal called "MyChart".  Sign up information is provided on this After Visit Summary.  MyChart is used to connect with patients for Virtual Visits (Telemedicine).  Patients are able to view lab/test results, encounter notes, upcoming appointments, etc.  Non-urgent messages can be sent to your provider as well.   To learn more about what you can do with MyChart, go to [email or web address].    Your next appointment:   6 month(s)  The format for your  next appointment:   In Person  Provider:   Candee Furbish, MD   Thank you for choosing [facility]!!         I,Alexis Bryant,acting as a scribe for Candee Furbish, MD.,have documented all relevant documentation on the behalf of Candee Furbish, MD,as directed by  Candee Furbish, MD while in the presence of Candee Furbish, MD.  I, Candee Furbish, MD, have reviewed all documentation for this visit. The documentation on 12/07/20 for the exam, diagnosis, procedures, and orders are all accurate and complete.  Signed, Candee Furbish, MD  12/07/2020 8:42 AM    [facility]

## 2020-12-14 ENCOUNTER — Other Ambulatory Visit: Payer: Self-pay | Admitting: Cardiology

## 2020-12-14 DIAGNOSIS — I952 Hypotension due to drugs: Secondary | ICD-10-CM

## 2020-12-17 ENCOUNTER — Other Ambulatory Visit: Payer: HMO

## 2020-12-17 ENCOUNTER — Other Ambulatory Visit: Payer: Self-pay

## 2020-12-17 DIAGNOSIS — E039 Hypothyroidism, unspecified: Secondary | ICD-10-CM | POA: Diagnosis not present

## 2020-12-17 DIAGNOSIS — E782 Mixed hyperlipidemia: Secondary | ICD-10-CM

## 2020-12-19 ENCOUNTER — Ambulatory Visit (INDEPENDENT_AMBULATORY_CARE_PROVIDER_SITE_OTHER): Payer: HMO | Admitting: Nurse Practitioner

## 2020-12-19 ENCOUNTER — Encounter: Payer: Self-pay | Admitting: Nurse Practitioner

## 2020-12-19 ENCOUNTER — Other Ambulatory Visit: Payer: Self-pay

## 2020-12-19 VITALS — BP 160/110 | HR 65 | Temp 96.9°F | Ht 65.0 in | Wt 141.4 lb

## 2020-12-19 DIAGNOSIS — M170 Bilateral primary osteoarthritis of knee: Secondary | ICD-10-CM

## 2020-12-19 DIAGNOSIS — I10 Essential (primary) hypertension: Secondary | ICD-10-CM | POA: Diagnosis not present

## 2020-12-19 DIAGNOSIS — E039 Hypothyroidism, unspecified: Secondary | ICD-10-CM

## 2020-12-19 DIAGNOSIS — E782 Mixed hyperlipidemia: Secondary | ICD-10-CM

## 2020-12-19 DIAGNOSIS — J449 Chronic obstructive pulmonary disease, unspecified: Secondary | ICD-10-CM

## 2020-12-19 DIAGNOSIS — F3341 Major depressive disorder, recurrent, in partial remission: Secondary | ICD-10-CM

## 2020-12-19 DIAGNOSIS — I5022 Chronic systolic (congestive) heart failure: Secondary | ICD-10-CM | POA: Diagnosis not present

## 2020-12-19 DIAGNOSIS — R779 Abnormality of plasma protein, unspecified: Secondary | ICD-10-CM | POA: Diagnosis not present

## 2020-12-19 MED ORDER — DICLOFENAC SODIUM 1 % EX GEL: 4.0000 g | Freq: Four times a day (QID) | CUTANEOUS | 1 refills | Status: DC | PRN

## 2020-12-19 NOTE — Progress Notes (Signed)
Careteam: Patient Care Team: Sharon Seller, NP as PCP - General (Geriatric Medicine) Jake Bathe, MD as PCP - Cardiology (Cardiology) Allie Bossier, MD (Inactive) as Consulting Physician (Obstetrics and Gynecology) Gala Romney, Bevelyn Buckles, MD as Consulting Physician (Cardiology) Marjory Lies, Glenford Bayley, MD as Consulting Physician (Neurology)  PLACE OF SERVICE:  [facility]  Advanced Directive information Does Patient Have a Medical Advance Directive?: No, Would patient like information on creating a medical advance directive?: No - Patient declined  Allergies  Allergen Reactions  . Codeine Hives    Chief Complaint  Patient presents with  . Medical Management of Chronic Issues    4 month follow up. Needs refill on voltaren gel.     HPI: Patient is a 55 y.o. female for routine follow up.  Reports she does not take any of her medication every day.   Hypothyroid- on euthyrox 100 mcg  Hyperlipidemia- continues on crestor 40 mg daily  COPD-  CHF- contines on lasix, lisinopril, coreg and ASA. Followed by cardiology. Does have shortness of breath  Depression/insomnia- continues on trazodone and Remeron. Also taking welbutrin   htn- continues on coreg and lisinopril.  Elevated blood pressure today- had fried chicken and fries last night- added salt.  Will not take lasix if she is out- does not take everyday.  She does take coreg in the morning. Has not taken today.    Review of Systems:  Review of Systems  Constitutional: Negative for chills, fever and weight loss.  HENT: Negative for tinnitus.   Respiratory: Negative for cough, sputum production and shortness of breath.   Cardiovascular: Negative for chest pain, palpitations and leg swelling.  Gastrointestinal: Negative for abdominal pain, constipation, diarrhea and heartburn.  Genitourinary: Negative for dysuria, frequency and urgency.  Musculoskeletal: Positive for joint pain (knee). Negative for back pain, falls and  myalgias.  Skin: Negative.   Neurological: Positive for headaches (mild). Negative for dizziness.  Psychiatric/Behavioral: Negative for depression and memory loss. The patient does not have insomnia.     Past Medical History:  Diagnosis Date  . Anemia   . Anxiety   . At risk for sudden cardiac death Nov 25, 2012  . CHF - Combined Systolic + Diastolic Dysfunction. EF of 15-20% with Grade II diastolic dysfunction on echo 10/27/12 10/28/2012  . Coronary artery disease   . Depression   . Dyspnea   . Fibroids   . Heart murmur   . Hypertension   . Hypothyroidism   . S/P cardiac catheterization, 11-25-12, normal coronaries with minimal luminal irregularities in RCA system 10/30/2012  . Thyroid disease    hypothyroidism   Past Surgical History:  Procedure Laterality Date  . AORTIC VALVE REPLACEMENT N/A 03/12/2017   Procedure: AORTIC VALVE REPLACEMENT (AVR) with Mitral Annuloplasty;  Surgeon: Alleen Borne, MD;  Location: [facility] OR;  Service: Open Heart Surgery;  Laterality: N/A;  . LEFT AND RIGHT HEART CATHETERIZATION WITH CORONARY ANGIOGRAM N/A 2012-11-25   Procedure: LEFT AND RIGHT HEART CATHETERIZATION WITH CORONARY ANGIOGRAM;  Surgeon: Marykay Lex, MD;  Location: [facility] CATH LAB;  Service: Cardiovascular;  Laterality: N/A;  . MITRAL VALVE REPAIR N/A 03/12/2017   Procedure: MITRAL VALVE REPAIR (MVR);  Surgeon: Alleen Borne, MD;  Location: [facility] OR;  Service: Open Heart Surgery;  Laterality: N/A;  mitral annuloplasty  . MULTIPLE EXTRACTIONS WITH ALVEOLOPLASTY N/A 01/21/2017   Procedure: Extraction of tooth #'s 4-13 with alveoloplasty;  Surgeon: Charlynne Pander, DDS;  Location: [facility] OR;  Service: Oral  Surgery;  Laterality: N/A;  . RIGHT/LEFT HEART CATH AND CORONARY ANGIOGRAPHY N/A 01/09/2017   Procedure: Right/Left Heart Cath and Coronary Angiography;  Surgeon: Troy Sine, MD;  Location: [place] CV LAB;  Service: Cardiovascular;  Laterality: N/A;  . TEE WITHOUT CARDIOVERSION N/A 03/12/2017    Procedure: TRANSESOPHAGEAL ECHOCARDIOGRAM (TEE);  Surgeon: Gaye Pollack, MD;  Location: [place];  Service: Open Heart Surgery;  Laterality: N/A;   Social History:   reports that she quit smoking about 42 years ago. Her smoking use included cigars and cigarettes. She has a 2.50 pack-year smoking history. She has never used smokeless tobacco. She reports current alcohol use of about 3.0 standard drinks of alcohol per week. She reports previous drug use. Frequency: 5.00 times per week. Drug: Marijuana.  Family History  Problem Relation Age of Onset  . Bone cancer Paternal Grandfather   . Hypertension Mother   . Liver disease Mother        autoimmune  . Heart disease Mother   . Colon polyps Mother   . Stroke Father   . Hypertension Father   . Hypertension Maternal Grandmother   . Heart disease Maternal Grandmother   . Breast cancer Cousin   . Bone cancer Maternal Uncle   . Arthritis Paternal Uncle     Medications: Patient's Medications  New Prescriptions   No medications on file  Previous Medications   ASPIRIN EC 81 MG EC TABLET    Take 1 tablet (81 mg total) by mouth daily.   BUPROPION (WELLBUTRIN XL) 150 MG 24 HR TABLET    Take 1 tablet by mouth once daily   CARVEDILOL (COREG) 6.25 MG TABLET    Take 1 tablet (6.25 mg total) by mouth 2 (two) times daily.   CHOLECALCIFEROL (VITAMIN D3) 25 MCG (1000 UNIT) TABLET    Take 1,000 Units by mouth daily.   DICLOFENAC SODIUM (VOLTAREN) 1 % GEL    Apply 4 g topically as needed.   EUTHYROX 100 MCG TABLET    TAKE 1 TABLET BY MOUTH ONCE DAILY BEFORE BREAKFAST   FERROUS SULFATE 325 (65 FE) MG EC TABLET    Take 1 tablet (325 mg total) by mouth 2 (two) times daily.   FLUTICASONE (FLONASE) 50 MCG/ACT NASAL SPRAY    USE 1 SPRAY(S) IN EACH NOSTRIL ONCE DAILY AS NEEDED FOR ALLERGIES OR RHINITIS   FUROSEMIDE (LASIX) 40 MG TABLET    Take 1 tablet (40 mg total) by mouth daily.   LISINOPRIL (ZESTRIL) 5 MG TABLET    TAKE 1 TABLET BY MOUTH AT BEDTIME    MIRTAZAPINE (REMERON) 15 MG TABLET    TAKE 1 TABLET BY MOUTH ONCE DAILY AT BEDTIME   ROSUVASTATIN (CRESTOR) 40 MG TABLET    Take 1 tablet (40 mg total) by mouth daily.   TRAZODONE (DESYREL) 150 MG TABLET    TAKE 1 TABLET BY MOUTH ONCE DAILY AT BEDTIME  Modified Medications   No medications on file  Discontinued Medications   No medications on file    Physical Exam:  Vitals:   12/19/20 0912  BP: (!) 160/110  Pulse: 65  Temp: (!) 96.9 F (36.1 C)  TempSrc: Temporal  SpO2: 97%  Weight: 141 lb 6.4 oz (64.1 kg)  Height: 5\' 5"  (1.651 m)   Body mass index is 23.53 kg/m. Wt Readings from Last 3 Encounters:  12/19/20 141 lb 6.4 oz (64.1 kg)  12/07/20 138 lb 3.2 oz (62.7 kg)  09/03/20 140 lb 12.8 oz (63.9  kg)    Physical Exam Constitutional:      General: She is not in acute distress.    Appearance: She is well-developed. She is not diaphoretic.  HENT:     Head: Normocephalic and atraumatic.     Mouth/Throat:     Pharynx: No oropharyngeal exudate.  Eyes:     Conjunctiva/sclera: Conjunctivae normal.     Pupils: Pupils are equal, round, and reactive to light.  Cardiovascular:     Rate and Rhythm: Normal rate and regular rhythm.     Heart sounds: Normal heart sounds.  Pulmonary:     Effort: Pulmonary effort is normal.     Breath sounds: Normal breath sounds.  Abdominal:     General: Bowel sounds are normal.     Palpations: Abdomen is soft.  Musculoskeletal:        General: No tenderness.     Cervical back: Normal range of motion and neck supple.  Skin:    General: Skin is warm and dry.  Neurological:     Mental Status: She is alert and oriented to person, place, and time.     Labs reviewed: Basic Metabolic Panel: Recent Labs    04/19/20 0822 07/04/20 0920 08/29/20 0806 12/17/20 0000  NA  --  136 138 137  K  --  3.5 3.8 3.8  CL  --  96 104 103  CO2  --  23 30 23   GLUCOSE  --  93 86 81  BUN  --  23 16 17   CREATININE  --  1.27* 1.02 1.03  CALCIUM  --  10.4*  10.0 9.7  TSH 2.70  --  8.36* 4.99*   Liver Function Tests: Recent Labs    07/04/20 0920 08/29/20 0806 12/17/20 0000  AST 28 27 26   ALT 16 12 12   ALKPHOS 90  --   --   BILITOT 0.4 0.4 0.3  PROT 9.9* 8.9* 8.6*  ALBUMIN 5.0*  --   --    No results for input(s): LIPASE, AMYLASE in the last 8760 hours. No results for input(s): AMMONIA in the last 8760 hours. CBC: Recent Labs    02/13/20 1023 07/04/20 0920 08/29/20 0806 12/17/20 0000  WBC 3.6* 6.0 4.5 5.0  NEUTROABS 1,987  --  2,525 3,235  HGB 12.1 14.4 13.7 12.2  HCT 37.6 42.7 42.9 38.5  MCV 85.6 83 87.4 89.1  PLT 146 156 145 156   Lipid Panel: Recent Labs    07/04/20 0920 08/29/20 0806 12/17/20 0000  CHOL 186 169 182  HDL 56 57 67  LDLCALC 113* 97 101*  TRIG 92 67 47  CHOLHDL 3.3 3.0 2.7   TSH: Recent Labs    04/19/20 0822 08/29/20 0806 12/17/20 0000  TSH 2.70 8.36* 4.99*   A1C: Lab Results  Component Value Date   HGBA1C 5.4 06/08/2019     Assessment/Plan 1. Primary hypertension -elevated today, she declined clonidine offered due to elevated bp. Denies chest pains, worsening shortness of breath but reports mild headache. No vision changes.   -has not taken medication today- educated to take when she gets home and monitor bp -she has high sodium diet- dash diet given and stressed importance of dietary compliance.   2. Chronic systolic congestive heart failure (HCC) -stable, euvolemic at this time. Stressed importance of medication compliance (has not taken medication today), encouraged dietary changes and compliance.  -continues on lasix, coreg and lisinopril but does not take routinely  3. Obstructive airway disease (HCC) Stable at  this time.   4. Hypothyroidism, unspecified type -TSH still mildly elevated but she does not take her medication daily, encouraged compliance  5. Mixed hyperlipidemia -continues on statin, encouraged dietary and medication compliance to reduce risk of heart attack  and stroke.  6. Recurrent major depressive disorder, in partial remission (HCC) -stable on Wellbutrin, Remeron and trazodone.   7. Elevated blood protein -ongoing over the last year, will get further lab work at this time - UPEP/UIFE/Light Chains/TP, 24-Hr Ur - Protein Electrophoresis, Serum  8. Primary osteoarthritis of both knees - diclofenac Sodium (VOLTAREN) 1 % GEL; Apply 4 g topically 4 (four) times daily as needed.  Dispense: 100 g; Refill: 1  Next appt: 4 months.  Carlos American. [place], [facility] [phone]

## 2020-12-19 NOTE — Patient Instructions (Signed)
Goal bp is <140/90 Take blood pressure when you get home  [email or web address]">  DASH Eating Plan DASH stands for Dietary Approaches to Stop Hypertension. The DASH eating plan is a healthy eating plan that has been shown to:  Reduce high blood pressure (hypertension).  Reduce your risk for type 2 diabetes, heart disease, and stroke.  Help with weight loss. What are tips for following this plan? Reading food labels  Check food labels for the amount of salt (sodium) per serving. Choose foods with less than 5 percent of the Daily Value of sodium. Generally, foods with less than 300 milligrams (mg) of sodium per serving fit into this eating plan.  To find whole grains, look for the word "whole" as the first word in the ingredient list. Shopping  Buy products labeled as "low-sodium" or "no salt added."  Buy fresh foods. Avoid canned foods and pre-made or frozen meals. Cooking  Avoid adding salt when cooking. Use salt-free seasonings or herbs instead of table salt or sea salt. Check with your health care provider or pharmacist before using salt substitutes.  Do not fry foods. Cook foods using healthy methods such as baking, boiling, grilling, roasting, and broiling instead.  Cook with heart-healthy oils, such as olive, canola, avocado, soybean, or sunflower oil. Meal planning  Eat a balanced diet that includes: ? 4 or more servings of fruits and 4 or more servings of vegetables each day. Try to fill one-half of your plate with fruits and vegetables. ? 6-8 servings of whole grains each day. ? Less than 6 oz (170 g) of lean meat, poultry, or fish each day. A 3-oz (85-g) serving of meat is about the same size as a deck of cards. One egg equals 1 oz (28 g). ? 2-3 servings of low-fat dairy each day. One serving is 1 cup (237 mL). ? 1 serving of nuts, seeds, or beans 5 times each week. ? 2-3 servings of heart-healthy fats. Healthy fats called  omega-3 fatty acids are found in foods such as walnuts, flaxseeds, fortified milks, and eggs. These fats are also found in cold-water fish, such as sardines, salmon, and mackerel.  Limit how much you eat of: ? Canned or prepackaged foods. ? Food that is high in trans fat, such as some fried foods. ? Food that is high in saturated fat, such as fatty meat. ? Desserts and other sweets, sugary drinks, and other foods with added sugar. ? Full-fat dairy products.  Do not salt foods before eating.  Do not eat more than 4 egg yolks a week.  Try to eat at least 2 vegetarian meals a week.  Eat more home-cooked food and less restaurant, buffet, and fast food.   Lifestyle  When eating at a restaurant, ask that your food be prepared with less salt or no salt, if possible.  If you drink alcohol: ? Limit how much you use to:  0-1 drink a day for women who are not pregnant.  0-2 drinks a day for men. ? Be aware of how much alcohol is in your drink. In the [place] one drink equals one 12 oz bottle of beer (355 mL), one 5 oz glass of wine (148 mL), or one 1 oz glass of hard liquor (44 mL). General information  Avoid eating more than 2,300 mg of salt a day. If you have hypertension, you may need to reduce your sodium intake to 1,500 mg a day.  Work with your health care provider to maintain a healthy body  weight or to lose weight. Ask what an ideal weight is for you.  Get at least 30 minutes of exercise that causes your heart to beat faster (aerobic exercise) most days of the week. Activities may include walking, swimming, or biking.  Work with your health care provider or dietitian to adjust your eating plan to your individual calorie needs. What foods should I eat? Fruits All fresh, dried, or frozen fruit. Canned fruit in natural juice (without added sugar). Vegetables Fresh or frozen vegetables (raw, steamed, roasted, or grilled). Low-sodium or reduced-sodium tomato and vegetable juice.  Low-sodium or reduced-sodium tomato sauce and tomato paste. Low-sodium or reduced-sodium canned vegetables. Grains Whole-grain or whole-wheat bread. Whole-grain or whole-wheat pasta. Brown rice. [place]. Bulgur. Whole-grain and low-sodium cereals. Pita bread. Low-fat, low-sodium crackers. Whole-wheat flour tortillas. Meats and other proteins Skinless chicken or [place]. Ground chicken or [place]. Pork with fat trimmed off. Fish and seafood. Egg whites. Dried beans, peas, or lentils. Unsalted nuts, nut butters, and seeds. Unsalted canned beans. Lean cuts of beef with fat trimmed off. Low-sodium, lean precooked or cured meat, such as sausages or meat loaves. Dairy Low-fat (1%) or fat-free (skim) milk. Reduced-fat, low-fat, or fat-free cheeses. Nonfat, low-sodium ricotta or cottage cheese. Low-fat or nonfat yogurt. Low-fat, low-sodium cheese. Fats and oils Soft margarine without trans fats. Vegetable oil. Reduced-fat, low-fat, or light mayonnaise and salad dressings (reduced-sodium). Canola, safflower, olive, avocado, soybean, and sunflower oils. Avocado. Seasonings and condiments Herbs. Spices. Seasoning mixes without salt. Other foods Unsalted popcorn and pretzels. Fat-free sweets. The items listed above may not be a complete list of foods and beverages you can eat. Contact a dietitian for more information. What foods should I avoid? Fruits Canned fruit in a light or heavy syrup. Fried fruit. Fruit in cream or butter sauce. Vegetables Creamed or fried vegetables. Vegetables in a cheese sauce. Regular canned vegetables (not low-sodium or reduced-sodium). Regular canned tomato sauce and paste (not low-sodium or reduced-sodium). Regular tomato and vegetable juice (not low-sodium or reduced-sodium). Angie Fava. Olives. Grains Baked goods made with fat, such as croissants, muffins, or some breads. Dry pasta or rice meal packs. Meats and other proteins Fatty cuts of meat. Ribs. Fried meat. Berniece Salines.  Bologna, salami, and other precooked or cured meats, such as sausages or meat loaves. Fat from the back of a pig (fatback). Bratwurst. Salted nuts and seeds. Canned beans with added salt. Canned or smoked fish. Whole eggs or egg yolks. Chicken or [place] with skin. Dairy Whole or 2% milk, cream, and half-and-half. Whole or full-fat cream cheese. Whole-fat or sweetened yogurt. Full-fat cheese. Nondairy creamers. Whipped toppings. Processed cheese and cheese spreads. Fats and oils Butter. Stick margarine. Lard. Shortening. Ghee. Bacon fat. Tropical oils, such as coconut, palm kernel, or palm oil. Seasonings and condiments Onion salt, garlic salt, seasoned salt, table salt, and sea salt. Worcestershire sauce. Tartar sauce. Barbecue sauce. Teriyaki sauce. Soy sauce, including reduced-sodium. Steak sauce. Canned and packaged gravies. Fish sauce. Oyster sauce. Cocktail sauce. Store-bought horseradish. Ketchup. Mustard. Meat flavorings and tenderizers. Bouillon cubes. Hot sauces. Pre-made or packaged marinades. Pre-made or packaged taco seasonings. Relishes. Regular salad dressings. Other foods Salted popcorn and pretzels. The items listed above may not be a complete list of foods and beverages you should avoid. Contact a dietitian for more information. Where to find more information  National Heart, Lung, and Blood Institute: https://wilson-eaton.com/  American Heart Association: www.heart.org  Academy of Nutrition and Dietetics: www.eatright.[place]: www.kidney.org Summary  The DASH  eating plan is a healthy eating plan that has been shown to reduce high blood pressure (hypertension). It may also reduce your risk for type 2 diabetes, heart disease, and stroke.  When on the DASH eating plan, aim to eat more fresh fruits and vegetables, whole grains, lean proteins, low-fat dairy, and heart-healthy fats.  With the DASH eating plan, you should limit salt (sodium) intake to 2,300 mg a  day. If you have hypertension, you may need to reduce your sodium intake to 1,500 mg a day.  Work with your health care provider or dietitian to adjust your eating plan to your individual calorie needs. This information is not intended to replace advice given to you by your health care provider. Make sure you discuss any questions you have with your health care provider. Document Revised: 06/24/2019 Document Reviewed: 06/24/2019 Elsevier Patient Education  2021 [employer].

## 2020-12-20 ENCOUNTER — Ambulatory Visit: Payer: HMO | Admitting: Internal Medicine

## 2020-12-20 NOTE — Addendum Note (Signed)
Addended by: Logan Bores on: 12/20/2020 04:37 PM   Modules accepted: Orders

## 2020-12-21 LAB — COMPLETE METABOLIC PANEL WITH GFR
AG Ratio: 1 (calc) (ref 1.0–2.5)
ALT: 12 U/L (ref 6–29)
AST: 26 U/L (ref 10–35)
Albumin: 4.3 g/dL (ref 3.6–5.1)
Alkaline phosphatase (APISO): 61 U/L (ref 37–153)
BUN: 17 mg/dL (ref 7–25)
CO2: 23 mmol/L (ref 20–32)
Calcium: 9.7 mg/dL (ref 8.6–10.4)
Chloride: 103 mmol/L (ref 98–110)
Creat: 1.03 mg/dL (ref 0.50–1.05)
GFR, Est African American: 70 mL/min/{1.73_m2} (ref >=60)
GFR, Est Non African American: 60 mL/min/{1.73_m2} (ref >=60)
Globulin: 4.3 g/dL (calc) — ABNORMAL HIGH (ref 1.9–3.7)
Glucose, Bld: 81 mg/dL (ref 65–99)
Potassium: 3.8 mmol/L (ref 3.5–5.3)
Sodium: 137 mmol/L (ref 135–146)
Total Bilirubin: 0.3 mg/dL (ref 0.2–1.2)
Total Protein: 8.6 g/dL — ABNORMAL HIGH (ref 6.1–8.1)

## 2020-12-21 LAB — PROTEIN ELECTROPHORESIS, SERUM
Albumin ELP: 4.1 g/dL (ref 3.8–4.8)
Alpha 1: 0.3 g/dL (ref 0.2–0.3)
Alpha 2: 0.9 g/dL (ref 0.5–0.9)
Beta 2: 0.4 g/dL (ref 0.2–0.5)
Beta Globulin: 0.5 g/dL (ref 0.4–0.6)
Gamma Globulin: 2.3 g/dL — ABNORMAL HIGH (ref 0.8–1.7)
Total Protein: 8.6 g/dL — ABNORMAL HIGH (ref 6.1–8.1)

## 2020-12-21 LAB — PATHOLOGIST SMEAR REVIEW

## 2020-12-21 LAB — CBC WITH DIFFERENTIAL/PLATELET
Absolute Monocytes: 360 cells/uL (ref 200–950)
Basophils Absolute: 30 cells/uL (ref 0–200)
Basophils Relative: 0.6 %
Eosinophils Absolute: 90 cells/uL (ref 15–500)
Eosinophils Relative: 1.8 %
HCT: 38.5 % (ref 35.0–45.0)
Hemoglobin: 12.2 g/dL (ref 11.7–15.5)
Lymphs Abs: 1285 cells/uL (ref 850–3900)
MCH: 28.2 pg (ref 27.0–33.0)
MCHC: 31.7 g/dL — ABNORMAL LOW (ref 32.0–36.0)
MCV: 89.1 fL (ref 80.0–100.0)
MPV: 11.8 fL (ref 7.5–12.5)
Monocytes Relative: 7.2 %
Neutro Abs: 3235 cells/uL (ref 1500–7800)
Neutrophils Relative %: 64.7 %
Platelets: 156 10*3/uL (ref 140–400)
RBC: 4.32 10*6/uL (ref 3.80–5.10)
RDW: 14.3 % (ref 11.0–15.0)
Total Lymphocyte: 25.7 %
WBC: 5 10*3/uL (ref 3.8–10.8)

## 2020-12-21 LAB — LIPID PANEL
Cholesterol: 182 mg/dL (ref <200)
HDL: 67 mg/dL (ref >=50)
LDL Cholesterol (Calc): 101 mg/dL (calc) — ABNORMAL HIGH
Non-HDL Cholesterol (Calc): 115 mg/dL (calc) (ref <130)
Total CHOL/HDL Ratio: 2.7 (calc) (ref <5.0)
Triglycerides: 47 mg/dL (ref <150)

## 2020-12-21 LAB — TEST AUTHORIZATION

## 2020-12-21 LAB — TSH: TSH: 4.99 mIU/L — ABNORMAL HIGH (ref 0.40–4.50)

## 2020-12-25 DIAGNOSIS — R779 Abnormality of plasma protein, unspecified: Secondary | ICD-10-CM | POA: Diagnosis not present

## 2020-12-26 ENCOUNTER — Other Ambulatory Visit: Payer: Self-pay

## 2020-12-26 DIAGNOSIS — R779 Abnormality of plasma protein, unspecified: Secondary | ICD-10-CM

## 2020-12-26 NOTE — Addendum Note (Signed)
Addended by: Logan Bores on: 12/26/2020 10:11 AM   Modules accepted: Orders

## 2020-12-28 LAB — PROTEIN, TOTAL AND ELECTRO, 24 HR U
Albumin: 66 %
Alpha-1-Globulin, U: 4 %
Alpha-2-Globulin, U: 11 %
Beta Globulin, U: 11 %
Creatinine, 24H Ur: 0.81 g/(24.h) (ref 0.50–2.15)
Gamma Globulin, U: 9 %
PROTEIN/CREATININE RATIO: 0.148 mg/mg creat — ABNORMAL HIGH (ref <=0.114)
PROTEIN/CREATININE RATIO: 148 mg/g creat — ABNORMAL HIGH (ref <=114)
Protein, 24H Urine: 120 mg/24 h (ref 0–149)

## 2021-01-07 ENCOUNTER — Other Ambulatory Visit: Payer: Self-pay | Admitting: Nurse Practitioner

## 2021-01-07 DIAGNOSIS — F5104 Psychophysiologic insomnia: Secondary | ICD-10-CM

## 2021-04-03 ENCOUNTER — Other Ambulatory Visit: Payer: Self-pay | Admitting: Adult Health

## 2021-04-03 DIAGNOSIS — F5104 Psychophysiologic insomnia: Secondary | ICD-10-CM

## 2021-04-03 NOTE — Telephone Encounter (Signed)
High risk or very high risk warning populated when attempting to refill medication. RX request sent to PCP for review and approval if warranted.   

## 2021-04-22 ENCOUNTER — Ambulatory Visit (INDEPENDENT_AMBULATORY_CARE_PROVIDER_SITE_OTHER): Payer: HMO | Admitting: Nurse Practitioner

## 2021-04-22 ENCOUNTER — Other Ambulatory Visit: Payer: Self-pay

## 2021-04-22 ENCOUNTER — Encounter: Payer: Self-pay | Admitting: Nurse Practitioner

## 2021-04-22 VITALS — BP 128/76 | HR 78 | Temp 97.6°F | Ht 65.0 in | Wt 140.0 lb

## 2021-04-22 DIAGNOSIS — Z23 Encounter for immunization: Secondary | ICD-10-CM

## 2021-04-22 DIAGNOSIS — E039 Hypothyroidism, unspecified: Secondary | ICD-10-CM

## 2021-04-22 DIAGNOSIS — F3341 Major depressive disorder, recurrent, in partial remission: Secondary | ICD-10-CM

## 2021-04-22 DIAGNOSIS — M170 Bilateral primary osteoarthritis of knee: Secondary | ICD-10-CM | POA: Diagnosis not present

## 2021-04-22 DIAGNOSIS — I5022 Chronic systolic (congestive) heart failure: Secondary | ICD-10-CM

## 2021-04-22 DIAGNOSIS — I10 Essential (primary) hypertension: Secondary | ICD-10-CM

## 2021-04-22 DIAGNOSIS — E782 Mixed hyperlipidemia: Secondary | ICD-10-CM

## 2021-04-22 NOTE — Progress Notes (Signed)
Careteam: Patient Care Team: Lauree Chandler, NP as PCP - General (Geriatric Medicine) Jerline Pain, MD as PCP - Cardiology (Cardiology) Emily Filbert, MD (Inactive) as Consulting Physician (Obstetrics and Gynecology) Haroldine Laws, Shaune Pascal, MD as Consulting Physician (Cardiology) Leta Baptist, Earlean Polka, MD as Consulting Physician (Neurology)  PLACE OF SERVICE:  [place] Directive information Does Patient Have a Medical Advance Directive?: No, Would patient like information on creating a medical advance directive?: Yes (MAU/Ambulatory/Procedural Areas - Information given) (Patient has paperwork from a previous visit)  Allergies  Allergen Reactions   Codeine Hives    No chief complaint on file.    HPI: Patient is a 55 y.o. female for routine follow up. No acute concerns today.   CHF- stable at this time. Using lasix 3-4 times a week. Does not take it if she out during the day. Continues on coreg, asa, zestril.   Hyperlipidemia- continues on crestor- will take most days 4-5 times a week. Misses occasionally.   Hypothyroid- TSH mildly elevated in may. She takes about every day- takes first thing in the morning so she does not forget.   Depression- continues on wellbutrin and remeron   OA- uses voltaren gel PRN.  Reports she has back pain that goes into her arm occasionally has been going on for years. No worsening of the pain or frequency. Currently not having pain or radiation of pain.  Review of Systems:  Review of Systems  Constitutional:  Negative for chills, fever and weight loss.  HENT:  Negative for tinnitus.   Respiratory:  Negative for cough, sputum production and shortness of breath.   Cardiovascular:  Negative for chest pain, palpitations and leg swelling.  Gastrointestinal:  Negative for abdominal pain, constipation, diarrhea and heartburn.  Genitourinary:  Negative for dysuria, frequency and urgency.  Musculoskeletal:  Positive for back pain and  joint pain. Negative for falls and myalgias.  Skin: Negative.   Neurological:  Negative for dizziness and headaches.  Psychiatric/Behavioral:  Negative for depression and memory loss. The patient does not have insomnia.    Past Medical History:  Diagnosis Date   Anemia    Anxiety    At risk for sudden cardiac death 11-10-2012   CHF - Combined Systolic + Diastolic Dysfunction. EF of 15-20% with Grade II diastolic dysfunction on echo 10/27/12 10/28/2012   Coronary artery disease    Depression    Dyspnea    Fibroids    Heart murmur    Hypertension    Hypothyroidism    S/P cardiac catheterization, 2012-11-10, normal coronaries with minimal luminal irregularities in RCA system 10/30/2012   Thyroid disease    hypothyroidism   Past Surgical History:  Procedure Laterality Date   AORTIC VALVE REPLACEMENT N/A 03/12/2017   Procedure: AORTIC VALVE REPLACEMENT (AVR) with Mitral Annuloplasty;  Surgeon: Gaye Pollack, MD;  Location: [place];  Service: Open Heart Surgery;  Laterality: N/A;   LEFT AND RIGHT HEART CATHETERIZATION WITH CORONARY ANGIOGRAM N/A 2012-11-10   Procedure: LEFT AND RIGHT HEART CATHETERIZATION WITH CORONARY ANGIOGRAM;  Surgeon: Leonie Man, MD;  Location: [facility] CATH LAB;  Service: Cardiovascular;  Laterality: N/A;   MITRAL VALVE REPAIR N/A 03/12/2017   Procedure: MITRAL VALVE REPAIR (MVR);  Surgeon: Gaye Pollack, MD;  Location: [place];  Service: Open Heart Surgery;  Laterality: N/A;  mitral annuloplasty   MULTIPLE EXTRACTIONS WITH ALVEOLOPLASTY N/A 01/21/2017   Procedure: Extraction of tooth #'s 4-13 with alveoloplasty;  Surgeon: Teena Dunk  F, DDS;  Location: [place];  Service: Oral Surgery;  Laterality: N/A;   RIGHT/LEFT HEART CATH AND CORONARY ANGIOGRAPHY N/A 01/09/2017   Procedure: Right/Left Heart Cath and Coronary Angiography;  Surgeon: Troy Sine, MD;  Location: [place] CV LAB;  Service: Cardiovascular;  Laterality: N/A;   TEE WITHOUT CARDIOVERSION N/A 03/12/2017    Procedure: TRANSESOPHAGEAL ECHOCARDIOGRAM (TEE);  Surgeon: Gaye Pollack, MD;  Location: [place];  Service: Open Heart Surgery;  Laterality: N/A;   Social History:   reports that she quit smoking about 42 years ago. Her smoking use included cigars and cigarettes. She has a 2.50 pack-year smoking history. She has never used smokeless tobacco. She reports current alcohol use of about 3.0 standard drinks per week. She reports that she does not currently use drugs after having used the following drugs: Marijuana. Frequency: 5.00 times per week.  Family History  Problem Relation Age of Onset   Bone cancer Paternal Grandfather    Hypertension Mother    Liver disease Mother        autoimmune   Heart disease Mother    Colon polyps Mother    Stroke Father    Hypertension Father    Hypertension Maternal Grandmother    Heart disease Maternal Grandmother    Breast cancer Cousin    Bone cancer Maternal Uncle    Arthritis Paternal Uncle     Medications: Patient's Medications  New Prescriptions   No medications on file  Previous Medications   ASPIRIN EC 81 MG EC TABLET    Take 1 tablet (81 mg total) by mouth daily.   BUPROPION (WELLBUTRIN XL) 150 MG 24 HR TABLET    Take 1 tablet by mouth once daily   CARVEDILOL (COREG) 6.25 MG TABLET    Take 1 tablet (6.25 mg total) by mouth 2 (two) times daily.   CHOLECALCIFEROL (VITAMIN D3) 25 MCG (1000 UNIT) TABLET    Take 1,000 Units by mouth daily.   DICLOFENAC SODIUM (VOLTAREN) 1 % GEL    Apply 4 g topically 4 (four) times daily as needed.   EUTHYROX 100 MCG TABLET    TAKE 1 TABLET BY MOUTH ONCE DAILY BEFORE BREAKFAST   FERROUS SULFATE 325 (65 FE) MG EC TABLET    Take 1 tablet (325 mg total) by mouth 2 (two) times daily.   FLUTICASONE (FLONASE) 50 MCG/ACT NASAL SPRAY    USE 1 SPRAY(S) IN EACH NOSTRIL ONCE DAILY AS NEEDED FOR ALLERGIES OR RHINITIS   FUROSEMIDE (LASIX) 40 MG TABLET    Take 1 tablet (40 mg total) by mouth daily.   LISINOPRIL (ZESTRIL) 5 MG  TABLET    TAKE 1 TABLET BY MOUTH AT BEDTIME   MIRTAZAPINE (REMERON) 15 MG TABLET    TAKE 1 TABLET BY MOUTH ONCE DAILY AT BEDTIME   ROSUVASTATIN (CRESTOR) 40 MG TABLET    Take 1 tablet (40 mg total) by mouth daily.   TRAZODONE (DESYREL) 150 MG TABLET    TAKE 1 TABLET BY MOUTH ONCE DAILY AT BEDTIME  Modified Medications   No medications on file  Discontinued Medications   No medications on file    Physical Exam:  Vitals:   04/22/21 0857  BP: 128/76  Pulse: 78  Temp: 97.6 F (36.4 C)  TempSrc: Temporal  SpO2: 99%  Weight: 140 lb (63.5 kg)  Height: 5' 5"  (1.651 m)   Body mass index is 23.3 kg/m. Wt Readings from Last 3 Encounters:  04/22/21 140 lb (63.5 kg)  12/19/20 141 lb 6.4 oz (64.1 kg)  12/07/20 138 lb 3.2 oz (62.7 kg)    Physical Exam Constitutional:      General: She is not in acute distress.    Appearance: She is well-developed. She is not diaphoretic.  HENT:     Head: Normocephalic and atraumatic.     Mouth/Throat:     Pharynx: No oropharyngeal exudate.  Eyes:     Conjunctiva/sclera: Conjunctivae normal.     Pupils: Pupils are equal, round, and reactive to light.  Cardiovascular:     Rate and Rhythm: Normal rate and regular rhythm.     Heart sounds: Normal heart sounds.  Pulmonary:     Effort: Pulmonary effort is normal.     Breath sounds: Normal breath sounds.  Abdominal:     General: Bowel sounds are normal.     Palpations: Abdomen is soft.  Musculoskeletal:     Cervical back: Normal range of motion and neck supple.     Right lower leg: No edema.     Left lower leg: No edema.  Skin:    General: Skin is warm and dry.  Neurological:     Mental Status: She is alert and oriented to person, place, and time.  Psychiatric:        Mood and Affect: Mood normal.    Labs reviewed: Basic Metabolic Panel: Recent Labs    07/04/20 0920 08/29/20 0806 12/17/20 0000  NA 136 138 137  K 3.5 3.8 3.8  CL 96 104 103  CO2 23 30 23   GLUCOSE 93 86 81  BUN 23 16  17   CREATININE 1.27* 1.02 1.03  CALCIUM 10.4* 10.0 9.7  TSH  --  8.36* 4.99*   Liver Function Tests: Recent Labs    07/04/20 0920 08/29/20 0806 12/17/20 0000  AST 28 27 26   ALT 16 12 12   ALKPHOS 90  --   --   BILITOT 0.4 0.4 0.3  PROT 9.9* 8.9* 8.6*  8.6*  ALBUMIN 5.0*  --   --    No results for input(s): LIPASE, AMYLASE in the last 8760 hours. No results for input(s): AMMONIA in the last 8760 hours. CBC: Recent Labs    07/04/20 0920 08/29/20 0806 12/17/20 0000  WBC 6.0 4.5 5.0  NEUTROABS  --  2,525 3,235  HGB 14.4 13.7 12.2  HCT 42.7 42.9 38.5  MCV 83 87.4 89.1  PLT 156 145 156   Lipid Panel: Recent Labs    07/04/20 0920 08/29/20 0806 12/17/20 0000  CHOL 186 169 182  HDL 56 57 67  LDLCALC 113* 97 101*  TRIG 92 67 47  CHOLHDL 3.3 3.0 2.7   TSH: Recent Labs    08/29/20 0806 12/17/20 0000  TSH 8.36* 4.99*   A1C: Lab Results  Component Value Date   HGBA1C 5.4 06/08/2019     Assessment/Plan 1. Need for influenza vaccination - Flu Vaccine QUAD 6+ mos PF IM (Fluarix Quad PF)  2. Primary hypertension Well controlled at this time. Continue current regimen. - CMP with eGFR([facility]) - CBC with Differential/Platelet  3. Chronic systolic congestive heart failure (HCC) Euvolemic- continues on asa, coreg and lisinopril with lasix  4. Hypothyroidism, unspecified type -on synthroid 100 mcg daily - TSH  5. Mixed hyperlipidemia -continue on crestor with dietary modifications.  - CMP with eGFR([facility]) - Lipid Panel  6. Recurrent major depressive disorder, in partial remission ([place]) -stable on wellbutrin and remeron.  7. Primary osteoarthritis of both knees Ongoing, stable with  volaren gel.    Next appt: 6 months.  Carlos American. [place], [facility] [phone]

## 2021-04-23 ENCOUNTER — Other Ambulatory Visit: Payer: Self-pay

## 2021-04-23 DIAGNOSIS — R7989 Other specified abnormal findings of blood chemistry: Secondary | ICD-10-CM

## 2021-04-23 LAB — CBC WITH DIFFERENTIAL/PLATELET
Absolute Monocytes: 525 cells/uL (ref 200–950)
Basophils Absolute: 10 cells/uL (ref 0–200)
Basophils Relative: 0.2 %
Eosinophils Absolute: 73 cells/uL (ref 15–500)
Eosinophils Relative: 1.4 %
HCT: 42 % (ref 35.0–45.0)
Hemoglobin: 13.4 g/dL (ref 11.7–15.5)
Lymphs Abs: 1409 cells/uL (ref 850–3900)
MCH: 28.2 pg (ref 27.0–33.0)
MCHC: 31.9 g/dL — ABNORMAL LOW (ref 32.0–36.0)
MCV: 88.4 fL (ref 80.0–100.0)
MPV: 12.4 fL (ref 7.5–12.5)
Monocytes Relative: 10.1 %
Neutro Abs: 3182 cells/uL (ref 1500–7800)
Neutrophils Relative %: 61.2 %
Platelets: 163 10*3/uL (ref 140–400)
RBC: 4.75 10*6/uL (ref 3.80–5.10)
RDW: 14.1 % (ref 11.0–15.0)
Total Lymphocyte: 27.1 %
WBC: 5.2 10*3/uL (ref 3.8–10.8)

## 2021-04-23 LAB — COMPLETE METABOLIC PANEL WITH GFR
AG Ratio: 1 (calc) (ref 1.0–2.5)
ALT: 9 U/L (ref 6–29)
AST: 22 U/L (ref 10–35)
Albumin: 4.7 g/dL (ref 3.6–5.1)
Alkaline phosphatase (APISO): 70 U/L (ref 37–153)
BUN: 14 mg/dL (ref 7–25)
CO2: 26 mmol/L (ref 20–32)
Calcium: 10.6 mg/dL — ABNORMAL HIGH (ref 8.6–10.4)
Chloride: 98 mmol/L (ref 98–110)
Creat: 1.02 mg/dL (ref 0.50–1.03)
Globulin: 4.9 g/dL (calc) — ABNORMAL HIGH (ref 1.9–3.7)
Glucose, Bld: 80 mg/dL (ref 65–99)
Potassium: 3.8 mmol/L (ref 3.5–5.3)
Sodium: 135 mmol/L (ref 135–146)
Total Bilirubin: 0.3 mg/dL (ref 0.2–1.2)
Total Protein: 9.6 g/dL — ABNORMAL HIGH (ref 6.1–8.1)
eGFR: 64 mL/min/{1.73_m2} (ref >=60)

## 2021-04-23 LAB — LIPID PANEL
Cholesterol: 193 mg/dL (ref <200)
HDL: 66 mg/dL (ref >=50)
LDL Cholesterol (Calc): 112 mg/dL (calc) — ABNORMAL HIGH
Non-HDL Cholesterol (Calc): 127 mg/dL (calc) (ref <130)
Total CHOL/HDL Ratio: 2.9 (calc) (ref <5.0)
Triglycerides: 65 mg/dL (ref <150)

## 2021-04-23 LAB — TSH: TSH: 2.8 mIU/L (ref 0.40–4.50)

## 2021-04-25 ENCOUNTER — Other Ambulatory Visit: Payer: HMO

## 2021-04-25 ENCOUNTER — Other Ambulatory Visit: Payer: Self-pay

## 2021-04-25 DIAGNOSIS — R7989 Other specified abnormal findings of blood chemistry: Secondary | ICD-10-CM | POA: Diagnosis not present

## 2021-04-26 LAB — PTH, INTACT AND CALCIUM
Calcium: 10.3 mg/dL (ref 8.6–10.4)
PTH: 35 pg/mL (ref 16–77)

## 2021-05-02 ENCOUNTER — Other Ambulatory Visit: Payer: Self-pay | Admitting: *Deleted

## 2021-05-02 DIAGNOSIS — E782 Mixed hyperlipidemia: Secondary | ICD-10-CM

## 2021-05-02 MED ORDER — ROSUVASTATIN CALCIUM 40 MG PO TABS: 40.0000 mg | ORAL_TABLET | Freq: Every day | ORAL | 3 refills | Status: DC

## 2021-05-02 NOTE — Telephone Encounter (Signed)
Pharmacy requested refill

## 2021-08-09 ENCOUNTER — Other Ambulatory Visit: Payer: Self-pay

## 2021-08-09 ENCOUNTER — Encounter: Payer: Self-pay | Admitting: Cardiology

## 2021-08-09 ENCOUNTER — Ambulatory Visit (INDEPENDENT_AMBULATORY_CARE_PROVIDER_SITE_OTHER): Payer: HMO | Admitting: Cardiology

## 2021-08-09 DIAGNOSIS — Z9889 Other specified postprocedural states: Secondary | ICD-10-CM | POA: Diagnosis not present

## 2021-08-09 DIAGNOSIS — Z952 Presence of prosthetic heart valve: Secondary | ICD-10-CM

## 2021-08-09 DIAGNOSIS — I5022 Chronic systolic (congestive) heart failure: Secondary | ICD-10-CM | POA: Diagnosis not present

## 2021-08-09 DIAGNOSIS — I77810 Thoracic aortic ectasia: Secondary | ICD-10-CM

## 2021-08-09 NOTE — Assessment & Plan Note (Signed)
Dental prophylaxis.  Dr. Cyndia Bent.  Echo 2021 reviewed.  Functioning normally.  Minimal murmur heard on exam.

## 2021-08-09 NOTE — Assessment & Plan Note (Signed)
42 mm aortic root.  Ascending aorta 40 mm.  Stable.  We will continue to monitor.

## 2021-08-09 NOTE — Patient Instructions (Signed)
Medication Instructions:  The current medical regimen is effective;  continue present plan and medications.  *If you need a refill on your cardiac medications before your next appointment, please call your pharmacy*  Follow-Up: At [facility], you and your health needs are our priority.  As part of our continuing mission to provide you with exceptional heart care, we have created designated Provider Care Teams.  These Care Teams include your primary Cardiologist (physician) and Advanced Practice Providers (APPs -  Physician Assistants and Nurse Practitioners) who all work together to provide you with the care you need, when you need it.  We recommend signing up for the patient portal called "MyChart".  Sign up information is provided on this After Visit Summary.  MyChart is used to connect with patients for Virtual Visits (Telemedicine).  Patients are able to view lab/test results, encounter notes, upcoming appointments, etc.  Non-urgent messages can be sent to your provider as well.   To learn more about what you can do with MyChart, go to [email or web address].    Your next appointment:   1 year(s)  The format for your next appointment:   In Person  Provider:   Mark Skains, MD   Thank you for choosing [facility]!!    

## 2021-08-09 NOTE — Assessment & Plan Note (Addendum)
In the past has had trouble with Entresto.  Could not afford.  Continuing with current medication strategy which includes carvedilol lisinopril Lasix as above.  No change in medication management.  Tolerating well.  Prior creatinine in the 1.3-1.4 range.  Lab work reviewed as above.  Thankfully, ejection fraction has returned to normal.

## 2021-08-09 NOTE — Assessment & Plan Note (Signed)
Dr. Cyndia Bent 2018.  Stable.  Echocardiogram 2021.  Dental prophylaxis.

## 2021-08-09 NOTE — Progress Notes (Signed)
Cardiology Office Note:    Date:  08/09/2021   ID:  Miranda [name], DOB January 24, 1966, MRN [id number]  PCP:  Lauree Chandler, NP   [facility] Providers Cardiologist:  Candee Furbish, MD     Referring MD: Lauree Chandler, NP    History of Present Illness:    Miranda [name] is a 56 y.o. female here for follow-up of aortic valve replacement, mitral valve repair, hypertension.  She had nonischemic cardiomyopathy, prior EF 15%, now normal. Had aortic valve replacement as well as mitral valve repair. She has had  AVR with Dr. Cyndia Bent on 03/12/2017 with 23 mm Edwards Magna-Ease pericardial valve and MV annuloplasty with 28 mm Sorin 3D MEMO ring after presenting with acute heart failure/severe AI.  Repeat echocardiogram in 2021 shows EF of 65%.  Overall has been doing fairly well but she has some shortness of breath with activity climbing stairs for instance but this is her baseline.  Seems to be tolerating all of her medications quite well.  Denying any chest pain fevers chills nausea vomiting syncope bleeding.  Past Medical History:  Diagnosis Date   Anemia    Anxiety    At risk for sudden cardiac death 24-Nov-2012   CHF - Combined Systolic + Diastolic Dysfunction. EF of 15-20% with Grade II diastolic dysfunction on echo 10/27/12 10/28/2012   Coronary artery disease    Depression    Dyspnea    Fibroids    Heart murmur    Hypertension    Hypothyroidism    S/P cardiac catheterization, 11/24/12, normal coronaries with minimal luminal irregularities in RCA system 10/30/2012   Thyroid disease    hypothyroidism    Past Surgical History:  Procedure Laterality Date   AORTIC VALVE REPLACEMENT N/A 03/12/2017   Procedure: AORTIC VALVE REPLACEMENT (AVR) with Mitral Annuloplasty;  Surgeon: Gaye Pollack, MD;  Location: [place];  Service: Open Heart Surgery;  Laterality: N/A;   LEFT AND RIGHT HEART CATHETERIZATION WITH CORONARY ANGIOGRAM N/A 11/24/12   Procedure: LEFT AND RIGHT HEART  CATHETERIZATION WITH CORONARY ANGIOGRAM;  Surgeon: Leonie Man, MD;  Location: [facility] CATH LAB;  Service: Cardiovascular;  Laterality: N/A;   MITRAL VALVE REPAIR N/A 03/12/2017   Procedure: MITRAL VALVE REPAIR (MVR);  Surgeon: Gaye Pollack, MD;  Location: [place];  Service: Open Heart Surgery;  Laterality: N/A;  mitral annuloplasty   MULTIPLE EXTRACTIONS WITH ALVEOLOPLASTY N/A 01/21/2017   Procedure: Extraction of tooth #'s 4-13 with alveoloplasty;  Surgeon: Lenn Cal, DDS;  Location: [place];  Service: Oral Surgery;  Laterality: N/A;   RIGHT/LEFT HEART CATH AND CORONARY ANGIOGRAPHY N/A 01/09/2017   Procedure: Right/Left Heart Cath and Coronary Angiography;  Surgeon: Troy Sine, MD;  Location: [place] CV LAB;  Service: Cardiovascular;  Laterality: N/A;   TEE WITHOUT CARDIOVERSION N/A 03/12/2017   Procedure: TRANSESOPHAGEAL ECHOCARDIOGRAM (TEE);  Surgeon: Gaye Pollack, MD;  Location: [place];  Service: Open Heart Surgery;  Laterality: N/A;    Current Medications: Current Meds  Medication Sig   aspirin EC 81 MG EC tablet Take 1 tablet (81 mg total) by mouth daily.   buPROPion (WELLBUTRIN XL) 150 MG 24 hr tablet Take 1 tablet by mouth once daily   carvedilol (COREG) 6.25 MG tablet Take 1 tablet (6.25 mg total) by mouth 2 (two) times daily.   cholecalciferol (VITAMIN D3) 25 MCG (1000 UNIT) tablet Take 1,000 Units by mouth daily.   diclofenac Sodium (VOLTAREN) 1 % GEL Apply 4 g topically 4 (  four) times daily as needed.   EUTHYROX 100 MCG tablet TAKE 1 TABLET BY MOUTH ONCE DAILY BEFORE BREAKFAST   ferrous sulfate 325 (65 FE) MG EC tablet Take 1 tablet (325 mg total) by mouth 2 (two) times daily.   fluticasone (FLONASE) 50 MCG/ACT nasal spray USE 1 SPRAY(S) IN EACH NOSTRIL ONCE DAILY AS NEEDED FOR ALLERGIES OR RHINITIS   furosemide (LASIX) 40 MG tablet Take 1 tablet (40 mg total) by mouth daily.   lisinopril (ZESTRIL) 5 MG tablet TAKE 1 TABLET BY MOUTH AT BEDTIME   mirtazapine (REMERON)  15 MG tablet TAKE 1 TABLET BY MOUTH ONCE DAILY AT BEDTIME   rosuvastatin (CRESTOR) 40 MG tablet Take 1 tablet (40 mg total) by mouth daily.   traZODone (DESYREL) 150 MG tablet TAKE 1 TABLET BY MOUTH ONCE DAILY AT BEDTIME     Allergies:   Codeine   Social History   Socioeconomic History   Marital status: Single    Spouse name: Not on file   Number of children: 1   Years of education: 12th   Highest education level: Not on file  Occupational History   Occupation: unemployed    Employer: [employer]  Tobacco Use   Smoking status: Former    Packs/day: 0.50    Years: 5.00    Pack years: 2.50    Types: Cigars, Cigarettes    Quit date: 08/04/1978    Years since quitting: 43.0   Smokeless tobacco: Never   Tobacco comments:    1 cigar daily-quit 10/24/12    Vaping Use   Vaping Use: Never used  Substance and Sexual Activity   Alcohol use: Yes    Alcohol/week: 3.0 standard drinks    Types: 3 Glasses of wine per week   Drug use: Not Currently    Frequency: 5.0 times per week    Types: Marijuana    Comment: few weeks ago last   Sexual activity: Not Currently  Other Topics Concern   Not on file  Social History Narrative   Patient lives at home alone. (Condo)   Caffeine use: 1/2 soda daily   Diet   Martial status:single   Is it one or more stories? Yes   How many persons live in your home? 1   Do you have any pets in your home? No   Current or past profession: Biochemist, clinical   Do you exercise? No   Do you have a living will? No    Do you have a DNR form? No    Do you have signed POA/HPOA forms? No    Social Determinants of Radio broadcast assistant Strain: Not on file  Food Insecurity: Not on file  Transportation Needs: Not on file  Physical Activity: Not on file  Stress: Not on file  Social Connections: Not on file     Family History: The patient's family history includes Arthritis in her paternal uncle; Bone cancer in her maternal uncle and paternal  grandfather; Breast cancer in her cousin; Colon polyps in her mother; Heart disease in her maternal grandmother and mother; Hypertension in her father, maternal grandmother, and mother; Liver disease in her mother; Stroke in her father and mother.  ROS:   Please see the history of present illness.     All other systems reviewed and are negative.  EKGs/Labs/Other Studies Reviewed:    The following studies were reviewed today:  ECHO (05/14/20) 1. Left ventricular ejection fraction, by estimation, is 60 to 65%. The  left ventricle has normal function. The left ventricle has no regional wall motion abnormalities. There is moderate left ventricular hypertrophy. Left ventricular diastolic parameters are consistent with Grade II diastolic dysfunction (pseudonormalization). Elevated left atrial pressure. 2. Right ventricular systolic function is mildly reduced. The right ventricular size is normal. There is normal pulmonary artery systolic pressure. The estimated right ventricular systolic pressure is 42.6 mmHg. 3. Left atrial size was severely dilated. 4. The mitral valve has been repaired/replaced. There is a 28 mm prosthetic annuloplasty ring present in the mitral position. Mild to moderate mitral valve regurgitation. Mild mitral stenosis. MG 4 mmHg at HR 62 bpm, MVA 1.9 cm^2 by continuity equation. 5. The aortic valve has been repaired/replaced. There is a 23 mm bioprosthetic valve present in the aortic position. Aortic valve regurgitation is not visualized. Echo findings are consistent with normal structure and function of the aortic valve prosthesis. MG 9 mmHg, stable from prior echo on 10/19/17. 6. Aortic dilatation noted. There is mild dilatation of the aortic root, measuring 42 mm. There is mild dilatation of the ascending aorta, measuring 40 mm. 7. The inferior vena cava is normal in size with greater than 50% respiratory variability, suggesting right atrial pressure of 3 mmHg.     EKG:  EKG is  ordered today.  The ekg ordered today demonstrates sinus rhythm 73 nonspecific ST-T wave changes  Recent Labs: 04/22/2021: ALT 9; BUN 14; Creat 1.02; Hemoglobin 13.4; Platelets 163; Potassium 3.8; Sodium 135; TSH 2.80  Recent Lipid Panel    Component Value Date/Time   CHOL 193 04/22/2021 0927   CHOL 186 07/04/2020 0920   TRIG 65 04/22/2021 0927   HDL 66 04/22/2021 0927   HDL 56 07/04/2020 0920   CHOLHDL 2.9 04/22/2021 0927   VLDL 11 09/26/2016 1019   LDLCALC 112 (H) 04/22/2021 0927     Risk Assessment/Calculations:              Physical Exam:    VS:  BP 120/70 (BP Location: Right Arm, Patient Position: Sitting, Cuff Size: Normal)    Pulse 73    Ht 5\' 5"  (1.651 m)    Wt 140 lb (63.5 kg)    LMP 01/17/2015    SpO2 98%    BMI 23.30 kg/m     Wt Readings from Last 3 Encounters:  08/09/21 140 lb (63.5 kg)  04/22/21 140 lb (63.5 kg)  12/19/20 141 lb 6.4 oz (64.1 kg)     GEN:  Well nourished, well developed in no acute distress HEENT: Normal NECK: No JVD; No carotid bruits LYMPHATICS: No lymphadenopathy CARDIAC: RRR, 1/6 systolic murmur (aortic valve replacement), no rubs, gallops RESPIRATORY:  Clear to auscultation without rales, wheezing or rhonchi  ABDOMEN: Soft, non-tender, non-distended MUSCULOSKELETAL:  No edema; No deformity  SKIN: Warm and dry NEUROLOGIC:  Alert and oriented x 3 PSYCHIATRIC:  Normal affect   ASSESSMENT:    1. Chronic systolic congestive heart failure ([place])   2. Aortic valve replaced   3. Dilated aortic root ([place])   4. History of mitral valve repair    PLAN:    In order of problems listed above:  Systolic CHF ([place]) In the past has had trouble with Entresto.  Could not afford.  Continuing with current medication strategy which includes carvedilol lisinopril Lasix as above.  No change in medication management.  Tolerating well.  Prior creatinine in the 1.3-1.4 range.  Lab work reviewed as above.  Thankfully, ejection fraction  has returned to normal.  Aortic valve replaced Dental prophylaxis.  Dr. Cyndia Bent.  Echo 2021 reviewed.  Functioning normally.  Minimal murmur heard on exam.  Dilated aortic root (HCC) 42 mm aortic root.  Ascending aorta 40 mm.  Stable.  We will continue to monitor.  History of mitral valve repair Dr. Cyndia Bent 2018.  Stable.  Echocardiogram 2021.  Dental prophylaxis. Continue to monitor with echocardiogram      Medication Adjustments/Labs and Tests Ordered: Current medicines are reviewed at length with the patient today.  Concerns regarding medicines are outlined above.  Orders Placed This Encounter  Procedures   EKG 12-Lead   No orders of the defined types were placed in this encounter.   Patient Instructions  Medication Instructions:  The current medical regimen is effective;  continue present plan and medications.  *If you need a refill on your cardiac medications before your next appointment, please call your pharmacy*  Follow-Up: At [facility], you and your health needs are our priority.  As part of our continuing mission to provide you with exceptional heart care, we have created designated Provider Care Teams.  These Care Teams include your primary Cardiologist (physician) and Advanced Practice Providers (APPs -  Physician Assistants and Nurse Practitioners) who all work together to provide you with the care you need, when you need it.  We recommend signing up for the patient portal called "MyChart".  Sign up information is provided on this After Visit Summary.  MyChart is used to connect with patients for Virtual Visits (Telemedicine).  Patients are able to view lab/test results, encounter notes, upcoming appointments, etc.  Non-urgent messages can be sent to your provider as well.   To learn more about what you can do with MyChart, go to [email or web address].    Your next appointment:   1 year(s)  The format for your next appointment:   In Person  Provider:    Candee Furbish, MD    Thank you for choosing [facility]!!      Signed, Candee Furbish, MD  08/09/2021 10:04 AM    [place]

## 2021-08-12 ENCOUNTER — Other Ambulatory Visit: Payer: Self-pay | Admitting: *Deleted

## 2021-08-12 MED ORDER — BUPROPION HCL ER (XL) 150 MG PO TB24: 150.0000 mg | ORAL_TABLET | Freq: Every day | ORAL | 1 refills | Status: DC

## 2021-08-12 MED ORDER — LEVOTHYROXINE SODIUM 100 MCG PO TABS: 100.0000 ug | ORAL_TABLET | Freq: Every day | ORAL | 1 refills | Status: DC

## 2021-08-12 NOTE — Telephone Encounter (Signed)
Pharmacy requested refill

## 2021-08-12 NOTE — Addendum Note (Signed)
Addended by: Rafael Bihari A on: 08/12/2021 04:42 PM   Modules accepted: Orders

## 2021-08-13 ENCOUNTER — Telehealth: Payer: Self-pay

## 2021-08-13 NOTE — Telephone Encounter (Signed)
Yes okay to switch

## 2021-08-13 NOTE — Telephone Encounter (Signed)
Incoming fax was received from [place]:  Patient filled Euthyrox last but it's on backorder and the pharmacy would like to know if it's ok to switch to levothyroxine.  Please advise

## 2021-08-13 NOTE — Telephone Encounter (Signed)
I called [facility], spoke with Vivien Rota (female) and gave a verbal to switch medication.

## 2021-09-02 ENCOUNTER — Other Ambulatory Visit: Payer: Self-pay | Admitting: Cardiology

## 2021-09-03 ENCOUNTER — Other Ambulatory Visit: Payer: Self-pay | Admitting: *Deleted

## 2021-09-03 MED ORDER — CARVEDILOL 6.25 MG PO TABS: 6.2500 mg | ORAL_TABLET | Freq: Two times a day (BID) | ORAL | 3 refills | Status: DC

## 2021-10-03 ENCOUNTER — Other Ambulatory Visit: Payer: Self-pay | Admitting: Nurse Practitioner

## 2021-10-03 DIAGNOSIS — F5104 Psychophysiologic insomnia: Secondary | ICD-10-CM

## 2021-10-03 NOTE — Telephone Encounter (Signed)
Patient has request refill on medications "Mirtazapine", and "Trazodone". Patient medications both refilled in August 2022. Patient is due for refill on both medications. Patient medications have High Risk Warning. Medications pend and sent to PCP Lauree Chandler, NP . ?

## 2021-10-25 ENCOUNTER — Ambulatory Visit (INDEPENDENT_AMBULATORY_CARE_PROVIDER_SITE_OTHER): Payer: HMO | Admitting: Nurse Practitioner

## 2021-10-25 ENCOUNTER — Encounter: Payer: Self-pay | Admitting: Nurse Practitioner

## 2021-10-25 ENCOUNTER — Other Ambulatory Visit: Payer: Self-pay

## 2021-10-25 VITALS — BP 126/84 | HR 88 | Temp 97.9°F | Ht 65.0 in | Wt 140.0 lb

## 2021-10-25 DIAGNOSIS — R61 Generalized hyperhidrosis: Secondary | ICD-10-CM | POA: Diagnosis not present

## 2021-10-25 DIAGNOSIS — M170 Bilateral primary osteoarthritis of knee: Secondary | ICD-10-CM

## 2021-10-25 DIAGNOSIS — I428 Other cardiomyopathies: Secondary | ICD-10-CM | POA: Diagnosis not present

## 2021-10-25 DIAGNOSIS — F3341 Major depressive disorder, recurrent, in partial remission: Secondary | ICD-10-CM

## 2021-10-25 DIAGNOSIS — E039 Hypothyroidism, unspecified: Secondary | ICD-10-CM

## 2021-10-25 DIAGNOSIS — E782 Mixed hyperlipidemia: Secondary | ICD-10-CM

## 2021-10-25 DIAGNOSIS — J449 Chronic obstructive pulmonary disease, unspecified: Secondary | ICD-10-CM

## 2021-10-25 DIAGNOSIS — I10 Essential (primary) hypertension: Secondary | ICD-10-CM

## 2021-10-25 DIAGNOSIS — I5022 Chronic systolic (congestive) heart failure: Secondary | ICD-10-CM

## 2021-10-25 NOTE — Patient Instructions (Signed)
Decrease Wellbutrin to 75 mg daily for 2 weeks then to take every other day for 2 weeks then stop  ?

## 2021-10-25 NOTE — Progress Notes (Signed)
Careteam: Patient Care Team: Lauree Chandler, NP as PCP - General (Geriatric Medicine) Jerline Pain, MD as PCP - Cardiology (Cardiology) Emily Filbert, MD (Inactive) as Consulting Physician (Obstetrics and Gynecology) Haroldine Laws, Shaune Pascal, MD as Consulting Physician (Cardiology) Leta Baptist, Earlean Polka, MD as Consulting Physician (Neurology)  PLACE OF SERVICE:  [facility] Directive information    Allergies  Allergen Reactions   Codeine Hives    Chief Complaint  Patient presents with   Medication Management    6 month follow-up. Discuss need for mammogram or post pone if patient refuses. [facility] verified. Patient denies receiving any vaccines since last visit.       HPI: Patient is a 56 y.o. female a 6 month follow up ,  Concerned excessive sweating that is occurring all day. Previously advised not take black cohosh. Would like to know if there is anything else that can be done.  Last menstrual cycle was 8 years ago. Has been on Wellbutrin XL 150 mg for 8 years as well.   Hypothyroidism-Takes Synthroid 100 mcg. States she is taking it every morning, no missed pills.  Hypertension- Checks bp at home. States it is running good about 120-130 SBP. Takes Carvedilol 6.25 mg and Lisinopril 5 mg.   CHF- on Lasix 40 mg daily.  Last seen by CHF in January 2023. No worsening shortness of breath, LE edema or chest pain  Hyperlipidemia - Takes Crestor 40 mg daily. No issues or concerns.   Osteoarthritis - Hurts sometimes , takes Tylenol to ease the pain. Otherwise, the pain is the same.   Depression- ongoing, Talks to a therapist twice daily through [facility].   Does not exercise  Review of Systems:  Review of Systems  Constitutional:  Positive for diaphoresis. Negative for fever.  Respiratory:  Negative for cough, sputum production and shortness of breath.   Cardiovascular:  Negative for chest pain, palpitations and leg swelling.  Gastrointestinal:  Negative for  abdominal pain, constipation, diarrhea, nausea and vomiting.  Musculoskeletal:  Positive for joint pain.       Knee pain  Psychiatric/Behavioral:  The patient has insomnia.    Past Medical History:  Diagnosis Date   Anemia    Anxiety    At risk for sudden cardiac death 2012/11/17   CHF - Combined Systolic + Diastolic Dysfunction. EF of 15-20% with Grade II diastolic dysfunction on echo 10/27/12 10/28/2012   Coronary artery disease    Depression    Dyspnea    Fibroids    Heart murmur    Hypertension    Hypothyroidism    S/P cardiac catheterization, 11-17-2012, normal coronaries with minimal luminal irregularities in RCA system 10/30/2012   Thyroid disease    hypothyroidism   Past Surgical History:  Procedure Laterality Date   AORTIC VALVE REPLACEMENT N/A 03/12/2017   Procedure: AORTIC VALVE REPLACEMENT (AVR) with Mitral Annuloplasty;  Surgeon: Gaye Pollack, MD;  Location: [place];  Service: Open Heart Surgery;  Laterality: N/A;   LEFT AND RIGHT HEART CATHETERIZATION WITH CORONARY ANGIOGRAM N/A 17-Nov-2012   Procedure: LEFT AND RIGHT HEART CATHETERIZATION WITH CORONARY ANGIOGRAM;  Surgeon: Leonie Man, MD;  Location: [facility] CATH LAB;  Service: Cardiovascular;  Laterality: N/A;   MITRAL VALVE REPAIR N/A 03/12/2017   Procedure: MITRAL VALVE REPAIR (MVR);  Surgeon: Gaye Pollack, MD;  Location: [place];  Service: Open Heart Surgery;  Laterality: N/A;  mitral annuloplasty   MULTIPLE EXTRACTIONS WITH ALVEOLOPLASTY N/A 01/21/2017  Procedure: Extraction of tooth #'s 4-13 with alveoloplasty;  Surgeon: Lenn Cal, DDS;  Location: [place];  Service: Oral Surgery;  Laterality: N/A;   RIGHT/LEFT HEART CATH AND CORONARY ANGIOGRAPHY N/A 01/09/2017   Procedure: Right/Left Heart Cath and Coronary Angiography;  Surgeon: Troy Sine, MD;  Location: [place] CV LAB;  Service: Cardiovascular;  Laterality: N/A;   TEE WITHOUT CARDIOVERSION N/A 03/12/2017   Procedure: TRANSESOPHAGEAL ECHOCARDIOGRAM (TEE);   Surgeon: Gaye Pollack, MD;  Location: [place];  Service: Open Heart Surgery;  Laterality: N/A;   Social History:   reports that she quit smoking about 43 years ago. Her smoking use included cigars and cigarettes. She has a 2.50 pack-year smoking history. She has never used smokeless tobacco. She reports current alcohol use of about 3.0 standard drinks per week. She reports that she does not currently use drugs after having used the following drugs: Marijuana. Frequency: 5.00 times per week.  Family History  Problem Relation Age of Onset   Stroke Mother    Hypertension Mother    Liver disease Mother        autoimmune   Heart disease Mother    Colon polyps Mother    Stroke Father    Hypertension Father    Hypertension Maternal Grandmother    Heart disease Maternal Grandmother    Bone cancer Paternal Grandfather    Bone cancer Maternal Uncle    Arthritis Paternal Uncle    Breast cancer Cousin     Medications: Patient's Medications  New Prescriptions   No medications on file  Previous Medications   ASPIRIN EC 81 MG EC TABLET    Take 1 tablet (81 mg total) by mouth daily.   BUPROPION (WELLBUTRIN XL) 150 MG 24 HR TABLET    Take 1 tablet (150 mg total) by mouth daily.   CARVEDILOL (COREG) 6.25 MG TABLET    Take 1 tablet (6.25 mg total) by mouth 2 (two) times daily.   CHOLECALCIFEROL (D3) 50 MCG (2000 UT) TABS    Take 1 tablet by mouth daily.   DICLOFENAC SODIUM (VOLTAREN) 1 % GEL    Apply 4 g topically 4 (four) times daily as needed.   FERROUS SULFATE 325 (65 FE) MG EC TABLET    Take 1 tablet (325 mg total) by mouth 2 (two) times daily.   FLUTICASONE (FLONASE) 50 MCG/ACT NASAL SPRAY    USE 1 SPRAY(S) IN EACH NOSTRIL ONCE DAILY AS NEEDED FOR ALLERGIES OR RHINITIS   FUROSEMIDE (LASIX) 40 MG TABLET    Take 1 tablet by mouth once daily   LEVOTHYROXINE (EUTHYROX) 100 MCG TABLET    Take 1 tablet (100 mcg total) by mouth daily before breakfast.   LISINOPRIL (ZESTRIL) 5 MG TABLET    TAKE 1  TABLET BY MOUTH AT BEDTIME   MIRTAZAPINE (REMERON) 15 MG TABLET    TAKE 1 TABLET BY MOUTH ONCE DAILY AT BEDTIME   ROSUVASTATIN (CRESTOR) 40 MG TABLET    Take 1 tablet (40 mg total) by mouth daily.   TRAZODONE (DESYREL) 150 MG TABLET    TAKE 1 TABLET BY MOUTH ONCE DAILY AT BEDTIME  Modified Medications   No medications on file  Discontinued Medications   CHOLECALCIFEROL (VITAMIN D3) 25 MCG (1000 UNIT) TABLET    Take 1,000 Units by mouth daily.    Physical Exam:  Vitals:   10/25/21 0909  BP: 126/84  Pulse: 88  Temp: 97.9 F (36.6 C)  TempSrc: Temporal  SpO2: 99%  Weight: 140 lb (63.5 kg)  Height: '5\' 5"'$  (1.651 m)   Body mass index is 23.3 kg/m. Wt Readings from Last 3 Encounters:  10/25/21 140 lb (63.5 kg)  08/09/21 140 lb (63.5 kg)  04/22/21 140 lb (63.5 kg)    Physical Exam Constitutional:      Appearance: Normal appearance. She is normal weight.  HENT:     Mouth/Throat:     Mouth: Mucous membranes are dry.  Cardiovascular:     Rate and Rhythm: Normal rate and regular rhythm.     Pulses: Normal pulses.     Heart sounds: Normal heart sounds.  Pulmonary:     Effort: Pulmonary effort is normal.     Breath sounds: Normal breath sounds.  Abdominal:     General: Abdomen is flat. Bowel sounds are normal.     Palpations: Abdomen is soft.  Musculoskeletal:        General: Normal range of motion.  Neurological:     Mental Status: She is alert and oriented to person, place, and time.  Psychiatric:        Mood and Affect: Mood normal.        Behavior: Behavior normal.        Thought Content: Thought content normal.        Judgment: Judgment normal.    Labs reviewed: Basic Metabolic Panel: Recent Labs    12/17/20 0000 04/22/21 0927 04/25/21 1343  NA 137 135  --   K 3.8 3.8  --   CL 103 98  --   CO2 23 26  --   GLUCOSE 81 80  --   BUN 17 14  --   CREATININE 1.03 1.02  --   CALCIUM 9.7 10.6* 10.3  TSH 4.99* 2.80  --    Liver Function Tests: Recent Labs     12/17/20 0000 04/22/21 0927  AST 26 22  ALT 12 9  BILITOT 0.3 0.3  PROT 8.6*  8.6* 9.6*   No results for input(s): LIPASE, AMYLASE in the last 8760 hours. No results for input(s): AMMONIA in the last 8760 hours. CBC: Recent Labs    12/17/20 0000 04/22/21 0927  WBC 5.0 5.2  NEUTROABS 3,235 3,182  HGB 12.2 13.4  HCT 38.5 42.0  MCV 89.1 88.4  PLT 156 163   Lipid Panel: Recent Labs    12/17/20 0000 04/22/21 0927  CHOL 182 193  HDL 67 66  LDLCALC 101* 112*  TRIG 47 65  CHOLHDL 2.7 2.9   TSH: Recent Labs    12/17/20 0000 04/22/21 0927  TSH 4.99* 2.80   A1C: Lab Results  Component Value Date   HGBA1C 5.4 06/08/2019     Assessment/Plan  1. Cardiomyopathy, nonischemic (HCC) -stable without worsening of symptoms, will continue current regimen   2. Primary hypertension - lab orders:- CMP with eGFR([facility]) - CBC with Differential/Platelet  - HTN treated with Carvedilol 6.25 mg and Lisinopril 5 mg.  -Stable on current regimne  3. Chronic systolic congestive heart failure (HCC) - Takes Lasix 40 mg QD. No issues.  -Stable condition, euvolemic   4. Obstructive airway disease ([place]) - No issues, stable condition  5. Hypothyroidism, unspecified type - Takes Levothyroxine 100 mcg QD. -TSH ordered - No issues or concerns   6. Recurrent major depressive disorder, in partial remission ([place]) - Speaks with a therapist twice weekly and currently on  Wellbutrin 150 mg and Trazodone 150 mg HS however with excessive sweating will titrate her off wellbutrin  as it can contribute. Will follow up with her in 4 weeks to recheck depression symptoms to make sure they have not changed.  - Stable   7. Mixed hyperlipidemia - Controlled with Crestor 40 mg  -Continue dietary modification -No concerns  8. Primary osteoarthritis of both knees -Takes Tylenol PRN for arthritis pain -No issues or concerns. Stable.   9. Serum calcium elevated -follow up labs  10. Excessive  sweating - Has been taking Wellbutrin 150 mg for about 8 years when she first had issues with sweating. Diaphoresis is a side effect of Wellbutrin. Medication is modified to 75 mg XL until titrated off.  -Patient advised to monitor for decrease in symptoms. -Patient was also advised to call provider if feeling suicidal and need another medication to control depression.  -Patient will remain on Trazodone 150 mg HS.  - Will continue to monitor  Has AWV in 4 weeks and will also check in on depression during that time  Next appt: Return in about 6 months (around 04/27/2022) for routine follow up . I personally was present during the history, physical exam and medical decision-making activities of this service and have verified that the service and findings are accurately documented in the student's note  Ryan Palermo K. [place], [facility] [phone]

## 2021-10-28 ENCOUNTER — Other Ambulatory Visit: Payer: Self-pay | Admitting: Nurse Practitioner

## 2021-10-28 DIAGNOSIS — R771 Abnormality of globulin: Secondary | ICD-10-CM

## 2021-10-29 ENCOUNTER — Telehealth (INDEPENDENT_AMBULATORY_CARE_PROVIDER_SITE_OTHER): Payer: HMO | Admitting: Nurse Practitioner

## 2021-10-29 ENCOUNTER — Other Ambulatory Visit: Payer: Self-pay

## 2021-10-29 DIAGNOSIS — R771 Abnormality of globulin: Secondary | ICD-10-CM | POA: Diagnosis not present

## 2021-10-29 DIAGNOSIS — F3341 Major depressive disorder, recurrent, in partial remission: Secondary | ICD-10-CM | POA: Diagnosis not present

## 2021-10-29 DIAGNOSIS — R61 Generalized hyperhidrosis: Secondary | ICD-10-CM

## 2021-10-29 MED ORDER — BUPROPION HCL 75 MG PO TABS: 75.0000 mg | ORAL_TABLET | Freq: Every day | ORAL | 0 refills | Status: DC

## 2021-10-29 NOTE — Progress Notes (Signed)
Careteam: Patient Care Team: Lauree Chandler, NP as PCP - General (Geriatric Medicine) Jerline Pain, MD as PCP - Cardiology (Cardiology) Emily Filbert, MD (Inactive) as Consulting Physician (Obstetrics and Gynecology) Haroldine Laws, Shaune Pascal, MD as Consulting Physician (Cardiology) Penni Bombard, MD as Consulting Physician (Neurology)  Advanced Directive information    Allergies  Allergen Reactions   Codeine Hives    Chief Complaint  Patient presents with   Acute Visit    Discuss medication changes in regards to wellbutrin.  Patient has been having slourred speech and headaches since Friday.      HPI: Patient is a 56 y.o. female  Reports she is having a funny feeling in her head and feels like her voice is changing a little bit.  She has hx of Psychometer retardation and Wellbutrin had been managing symptoms.   States she has a headache.  She is still able to sleep well.  Eating and drinking well.   Review of Systems:  Review of Systems  HENT:  Negative for congestion and sore throat.   Neurological:  Positive for dizziness and headaches. Negative for speech change and focal weakness.   Past Medical History:  Diagnosis Date   Anemia    Anxiety    At risk for sudden cardiac death 11-12-12   CHF - Combined Systolic + Diastolic Dysfunction. EF of 15-20% with Grade II diastolic dysfunction on echo 10/27/12 10/28/2012   Coronary artery disease    Depression    Dyspnea    Fibroids    Heart murmur    Hypertension    Hypothyroidism    S/P cardiac catheterization, 11-12-2012, normal coronaries with minimal luminal irregularities in RCA system 10/30/2012   Thyroid disease    hypothyroidism   Past Surgical History:  Procedure Laterality Date   AORTIC VALVE REPLACEMENT N/A 03/12/2017   Procedure: AORTIC VALVE REPLACEMENT (AVR) with Mitral Annuloplasty;  Surgeon: Gaye Pollack, MD;  Location: [place];  Service: Open Heart Surgery;  Laterality: N/A;   LEFT AND RIGHT  HEART CATHETERIZATION WITH CORONARY ANGIOGRAM N/A 2012/11/12   Procedure: LEFT AND RIGHT HEART CATHETERIZATION WITH CORONARY ANGIOGRAM;  Surgeon: Leonie Man, MD;  Location: [facility] CATH LAB;  Service: Cardiovascular;  Laterality: N/A;   MITRAL VALVE REPAIR N/A 03/12/2017   Procedure: MITRAL VALVE REPAIR (MVR);  Surgeon: Gaye Pollack, MD;  Location: [place];  Service: Open Heart Surgery;  Laterality: N/A;  mitral annuloplasty   MULTIPLE EXTRACTIONS WITH ALVEOLOPLASTY N/A 01/21/2017   Procedure: Extraction of tooth #'s 4-13 with alveoloplasty;  Surgeon: Lenn Cal, DDS;  Location: [place];  Service: Oral Surgery;  Laterality: N/A;   RIGHT/LEFT HEART CATH AND CORONARY ANGIOGRAPHY N/A 01/09/2017   Procedure: Right/Left Heart Cath and Coronary Angiography;  Surgeon: Troy Sine, MD;  Location: [place] CV LAB;  Service: Cardiovascular;  Laterality: N/A;   TEE WITHOUT CARDIOVERSION N/A 03/12/2017   Procedure: TRANSESOPHAGEAL ECHOCARDIOGRAM (TEE);  Surgeon: Gaye Pollack, MD;  Location: [place];  Service: Open Heart Surgery;  Laterality: N/A;   Social History:   reports that she quit smoking about 43 years ago. Her smoking use included cigars and cigarettes. She has a 2.50 pack-year smoking history. She has never used smokeless tobacco. She reports current alcohol use of about 3.0 standard drinks per week. She reports that she does not currently use drugs after having used the following drugs: Marijuana. Frequency: 5.00 times per week.  Family History  Problem Relation Age  of Onset   Stroke Mother    Hypertension Mother    Liver disease Mother        autoimmune   Heart disease Mother    Colon polyps Mother    Stroke Father    Hypertension Father    Hypertension Maternal Grandmother    Heart disease Maternal Grandmother    Bone cancer Paternal Grandfather    Bone cancer Maternal Uncle    Arthritis Paternal Uncle    Breast cancer Cousin     Medications: Patient's Medications  New  Prescriptions   No medications on file  Previous Medications   ASPIRIN EC 81 MG EC TABLET    Take 1 tablet (81 mg total) by mouth daily.   BUPROPION (WELLBUTRIN XL) 150 MG 24 HR TABLET    Take 1 tablet (150 mg total) by mouth daily.   CARVEDILOL (COREG) 6.25 MG TABLET    Take 1 tablet (6.25 mg total) by mouth 2 (two) times daily.   CHOLECALCIFEROL (D3) 50 MCG (2000 UT) TABS    Take 1 tablet by mouth daily.   DICLOFENAC SODIUM (VOLTAREN) 1 % GEL    Apply 4 g topically 4 (four) times daily as needed.   FERROUS SULFATE 325 (65 FE) MG EC TABLET    Take 1 tablet (325 mg total) by mouth 2 (two) times daily.   FLUTICASONE (FLONASE) 50 MCG/ACT NASAL SPRAY    USE 1 SPRAY(S) IN EACH NOSTRIL ONCE DAILY AS NEEDED FOR ALLERGIES OR RHINITIS   FUROSEMIDE (LASIX) 40 MG TABLET    Take 1 tablet by mouth once daily   LEVOTHYROXINE (EUTHYROX) 100 MCG TABLET    Take 1 tablet (100 mcg total) by mouth daily before breakfast.   LISINOPRIL (ZESTRIL) 5 MG TABLET    TAKE 1 TABLET BY MOUTH AT BEDTIME   MIRTAZAPINE (REMERON) 15 MG TABLET    TAKE 1 TABLET BY MOUTH ONCE DAILY AT BEDTIME   ROSUVASTATIN (CRESTOR) 40 MG TABLET    Take 1 tablet (40 mg total) by mouth daily.   TRAZODONE (DESYREL) 150 MG TABLET    TAKE 1 TABLET BY MOUTH ONCE DAILY AT BEDTIME  Modified Medications   No medications on file  Discontinued Medications   No medications on file    Physical Exam:  There were no vitals filed for this visit. There is no height or weight on file to calculate BMI. Wt Readings from Last 3 Encounters:  10/25/21 140 lb (63.5 kg)  08/09/21 140 lb (63.5 kg)  04/22/21 140 lb (63.5 kg)      Labs reviewed: Basic Metabolic Panel: Recent Labs    12/17/20 0000 04/22/21 0927 04/25/21 1343 10/25/21 0947  NA 137 135  --  138  K 3.8 3.8  --  3.8  CL 103 98  --  101  CO2 23 26  --  26  GLUCOSE 81 80  --  71  BUN 17 14  --  23  CREATININE 1.03 1.02  --  1.13*  CALCIUM 9.7 10.6* 10.3 10.9*  TSH 4.99* 2.80  --  4.49    Liver Function Tests: Recent Labs    12/17/20 0000 04/22/21 0927 10/25/21 0947  AST '26 22 26  '$ ALT '12 9 17  '$ BILITOT 0.3 0.3 0.3  PROT 8.6*  8.6* 9.6* 9.6*   No results for input(s): LIPASE, AMYLASE in the last 8760 hours. No results for input(s): AMMONIA in the last 8760 hours. CBC: Recent Labs    12/17/20 0000  04/22/21 0927 10/25/21 0947  WBC 5.0 5.2 4.8  NEUTROABS 3,235 3,182 2,722  HGB 12.2 13.4 13.4  HCT 38.5 42.0 42.1  MCV 89.1 88.4 86.6  PLT 156 163 148   Lipid Panel: Recent Labs    12/17/20 0000 04/22/21 0927  CHOL 182 193  HDL 67 66  LDLCALC 101* 112*  TRIG 47 65  CHOLHDL 2.7 2.9   TSH: Recent Labs    12/17/20 0000 04/22/21 0927 10/25/21 0947  TSH 4.99* 2.80 4.49   A1C: Lab Results  Component Value Date   HGBA1C 5.4 06/08/2019     Assessment/Plan 1. Recurrent major depressive disorder, in partial remission ([place]) -with worsening  Psychometer retardation noted.  -will start Wellbutrin 75 mg tablets daily and follow symptoms. If  Psychometer retardation symptoms worsen may need to increase Wellbutrin back to 150 mg XL  - buPROPion (WELLBUTRIN) 75 MG tablet; Take 1 tablet (75 mg total) by mouth daily.  Dispense: 30 tablet; Refill: 0  2. Excessive sweating -attempting titration of Wellbutrin however  Psychometer retardation effects have worsened and she may not be able to titrate off   3. Elevated serum globulin level -she needs evaluation by hematology, referral has been placed she is unsure if she will make appt. Stressed importance - Protein Electrophoresis, Serum; Future - Protein Electrophoresis, Urine Rflx.; Future - Pathology Report ([facility]); Future - Immunofixation Electrophoresis, Serum; Future  4. Serum calcium elevated - PTH, Intact and Calcium; Future - Pathology Report ([facility]); Future - Immunofixation Electrophoresis, Serum; Future    Next appt: labs in 2 weeks to follow up.   Carlos American. Harle Battiest  [facility] [phone]    Virtual Visit via telephone  I connected with patient on 10/29/21 at  3:00 PM EDT by telephone and verified that I am speaking with the correct person using two identifiers.  Location: Patient: park Provider: [facility]   I discussed the limitations, risks, security and privacy concerns of performing an evaluation and management service by telephone and the availability of in person appointments. I also discussed with the patient that there may be a patient responsible charge related to this service. The patient expressed understanding and agreed to proceed.   I discussed the assessment and treatment plan with the patient. The patient was provided an opportunity to ask questions and all were answered. The patient agreed with the plan and demonstrated an understanding of the instructions.   The patient was advised to call back or seek an in-person evaluation if the symptoms worsen or if the condition fails to improve as anticipated.  I provided 15 minutes of non-face-to-face time during this encounter.  Carlos American. Harle Battiest Avs printed and mailed

## 2021-10-29 NOTE — Progress Notes (Signed)
This service is provided via telemedicine ? ?No vital signs collected/recorded due to the encounter was a telemedicine visit.  ? ?Location of patient (ex: home, work):  Home ? ?Patient consents to a telephone visit:  Yes, see encounter dated 11/13/2020 ? ?Location of the provider (ex: office, home):  [facility] ? ?Name of any referring provider:  N/A ? ?Names of all persons participating in the telemedicine service and their role in the encounter:  Sherrie Mustache, Nurse Practitioner, Carroll Kinds, CMA, and patient.  ? ?Time spent on call:  7 minutes with medical assistant ? ?

## 2021-10-30 LAB — TEST AUTHORIZATION 2

## 2021-10-30 LAB — CBC WITH DIFFERENTIAL/PLATELET
Absolute Monocytes: 389 cells/uL (ref 200–950)
Basophils Absolute: 19 cells/uL (ref 0–200)
Basophils Relative: 0.4 %
Eosinophils Absolute: 72 cells/uL (ref 15–500)
Eosinophils Relative: 1.5 %
HCT: 42.1 % (ref 35.0–45.0)
Hemoglobin: 13.4 g/dL (ref 11.7–15.5)
Lymphs Abs: 1598 cells/uL (ref 850–3900)
MCH: 27.6 pg (ref 27.0–33.0)
MCHC: 31.8 g/dL — ABNORMAL LOW (ref 32.0–36.0)
MCV: 86.6 fL (ref 80.0–100.0)
MPV: 12.7 fL — ABNORMAL HIGH (ref 7.5–12.5)
Monocytes Relative: 8.1 %
Neutro Abs: 2722 cells/uL (ref 1500–7800)
Neutrophils Relative %: 56.7 %
Platelets: 148 10*3/uL (ref 140–400)
RBC: 4.86 10*6/uL (ref 3.80–5.10)
RDW: 14 % (ref 11.0–15.0)
Total Lymphocyte: 33.3 %
WBC: 4.8 10*3/uL (ref 3.8–10.8)

## 2021-10-30 LAB — COMPLETE METABOLIC PANEL WITH GFR
AG Ratio: 1 (calc) (ref 1.0–2.5)
ALT: 17 U/L (ref 6–29)
AST: 26 U/L (ref 10–35)
Albumin: 4.7 g/dL (ref 3.6–5.1)
Alkaline phosphatase (APISO): 62 U/L (ref 37–153)
BUN/Creatinine Ratio: 20 (calc) (ref 6–22)
BUN: 23 mg/dL (ref 7–25)
CO2: 26 mmol/L (ref 20–32)
Calcium: 10.9 mg/dL — ABNORMAL HIGH (ref 8.6–10.4)
Chloride: 101 mmol/L (ref 98–110)
Creat: 1.13 mg/dL — ABNORMAL HIGH (ref 0.50–1.03)
Globulin: 4.9 g/dL (calc) — ABNORMAL HIGH (ref 1.9–3.7)
Glucose, Bld: 71 mg/dL (ref 65–139)
Potassium: 3.8 mmol/L (ref 3.5–5.3)
Sodium: 138 mmol/L (ref 135–146)
Total Bilirubin: 0.3 mg/dL (ref 0.2–1.2)
Total Protein: 9.6 g/dL — ABNORMAL HIGH (ref 6.1–8.1)
eGFR: 57 mL/min/{1.73_m2} — ABNORMAL LOW (ref >=60)

## 2021-10-30 LAB — PTH, INTACT AND CALCIUM

## 2021-10-30 LAB — TSH: TSH: 4.49 mIU/L (ref 0.40–4.50)

## 2021-10-31 ENCOUNTER — Telehealth: Payer: Self-pay | Admitting: Physician Assistant

## 2021-10-31 ENCOUNTER — Telehealth: Payer: Self-pay

## 2021-10-31 NOTE — Telephone Encounter (Signed)
Yes PTH was added to that lab ?

## 2021-10-31 NOTE — Telephone Encounter (Signed)
Scheduled appt per 3/27 referral. Pt is aware of appt date and time. Pt is aware to arrive 15 mins prior to appt time and to bring and updated insurance card. Pt is aware of appt location.   ?

## 2021-10-31 NOTE — Telephone Encounter (Signed)
Per April, [facility] lab tech PTH was unable to be added. Correspondence received from [facility] stating "No suitable specimen received."  ? ?Please advise on when patient needs to return to have test performed.  ? ?Side Note: Patient already has a lab appointment scheduled for 11/12/2021 ? ? ?

## 2021-11-12 ENCOUNTER — Other Ambulatory Visit: Payer: HMO

## 2021-11-12 DIAGNOSIS — R771 Abnormality of globulin: Secondary | ICD-10-CM | POA: Diagnosis not present

## 2021-11-13 ENCOUNTER — Telehealth: Payer: Self-pay | Admitting: Physician Assistant

## 2021-11-13 NOTE — Telephone Encounter (Signed)
Attempted to contact patient to confirm new Hem appt for this Friday. No answer so voicemail was left to call back to confirm  ?

## 2021-11-14 NOTE — Progress Notes (Signed)
[place] [phone]   Fax:([phone] [place] NOTE  Patient Care Team: Lauree Chandler, NP as PCP - General (Geriatric Medicine) Jerline Pain, MD as PCP - Cardiology (Cardiology) Emily Filbert, MD (Inactive) as Consulting Physician (Obstetrics and Gynecology) Bensimhon, Shaune Pascal, MD as Consulting Physician (Cardiology) Leta Baptist, Earlean Polka, MD as Consulting Physician (Neurology)  CHIEF COMPLAINTS/PURPOSE OF CONSULTATION:  Elevated IgG levels  HISTORY OF PRESENTING ILLNESS:  Miranda [name] 56 y.o. female with medical history significant for anxiety, depression, CHF, hypertension, hypothyroidism and CAD. She is unaccompanied for this visit.  On review of the previous records, Miranda [name] is noted to have elevated IgG levels as far back as 4 years ago.  Most recent SPEP with IFE from 11/12/2021 shows elevated IgG levels measuring 2521 mg/dL.  There is no evidence of monoclonal protein that was detected.  On exam today, Miranda [name] reports that her energy levels and appetite are stable.  She is able to complete all her daily activities on her own.  She denies any weight loss recently.  She denies nausea, vomiting or abdominal pain.  Her bowel habits are unchanged without any recurrent episodes of diarrhea or constipation.  Patient has right-sided sciatica pain and chronic left shoulder pain that is intermittent.  She has some tingling in her hands or feet that does not interfere with her grip or balance.  She denies any easy bruising or signs of active bleeding she denies fevers, chills, night sweats, shortness of breath, chest pain or cough.  MEDICAL HISTORY:  Past Medical History:  Diagnosis Date   Anemia    Anxiety    At risk for sudden cardiac death 11/12/2012   CHF - Combined Systolic + Diastolic Dysfunction. EF of 15-20% with Grade II diastolic dysfunction on echo 10/27/12 10/28/2012   Coronary artery disease    Depression    Dyspnea     Fibroids    Heart murmur    Hypertension    Hypothyroidism    S/P cardiac catheterization, 2012-11-12, normal coronaries with minimal luminal irregularities in RCA system 10/30/2012   Thyroid disease    hypothyroidism    SURGICAL HISTORY: Past Surgical History:  Procedure Laterality Date   AORTIC VALVE REPLACEMENT N/A 03/12/2017   Procedure: AORTIC VALVE REPLACEMENT (AVR) with Mitral Annuloplasty;  Surgeon: Gaye Pollack, MD;  Location: [facility];  Service: Open Heart Surgery;  Laterality: N/A;   LEFT AND RIGHT HEART CATHETERIZATION WITH CORONARY ANGIOGRAM N/A 11-12-12   Procedure: LEFT AND RIGHT HEART CATHETERIZATION WITH CORONARY ANGIOGRAM;  Surgeon: Leonie Man, MD;  Location: [facility] CATH LAB;  Service: Cardiovascular;  Laterality: N/A;   MITRAL VALVE REPAIR N/A 03/12/2017   Procedure: MITRAL VALVE REPAIR (MVR);  Surgeon: Gaye Pollack, MD;  Location: [place];  Service: Open Heart Surgery;  Laterality: N/A;  mitral annuloplasty   MULTIPLE EXTRACTIONS WITH ALVEOLOPLASTY N/A 01/21/2017   Procedure: Extraction of tooth #'s 4-13 with alveoloplasty;  Surgeon: Lenn Cal, DDS;  Location: [facility];  Service: Oral Surgery;  Laterality: N/A;   RIGHT/LEFT HEART CATH AND CORONARY ANGIOGRAPHY N/A 01/09/2017   Procedure: Right/Left Heart Cath and Coronary Angiography;  Surgeon: Troy Sine, MD;  Location: [place] CV LAB;  Service: Cardiovascular;  Laterality: N/A;   TEE WITHOUT CARDIOVERSION N/A 03/12/2017   Procedure: TRANSESOPHAGEAL ECHOCARDIOGRAM (TEE);  Surgeon: Gaye Pollack, MD;  Location: [place];  Service: Open Heart Surgery;  Laterality: N/A;    SOCIAL HISTORY: Social  History   Socioeconomic History   Marital status: Single    Spouse name: Not on file   Number of children: 1   Years of education: 12th   Highest education level: Not on file  Occupational History   Occupation: unemployed    Employer: [employer]  Tobacco Use   Smoking status: Former    Packs/day: 0.50     Years: 5.00    Pack years: 2.50    Types: Cigars, Cigarettes    Quit date: 08/04/1978    Years since quitting: 43.3   Smokeless tobacco: Never   Tobacco comments:    1 cigar daily-quit 10/24/12    Vaping Use   Vaping Use: Never used  Substance and Sexual Activity   Alcohol use: Yes    Alcohol/week: 3.0 standard drinks    Types: 3 Glasses of wine per week   Drug use: Not Currently    Frequency: 5.0 times per week    Types: Marijuana    Comment: few weeks ago last   Sexual activity: Not Currently  Other Topics Concern   Not on file  Social History Narrative   Patient lives at home alone. (Condo)   Caffeine use: 1/2 soda daily   Diet   Martial status:single   Is it one or more stories? Yes   How many persons live in your home? 1   Do you have any pets in your home? No   Current or past profession: Biochemist, clinical   Do you exercise? No   Do you have a living will? No    Do you have a DNR form? No    Do you have signed POA/HPOA forms? No    Social Determinants of Radio broadcast assistant Strain: Not on file  Food Insecurity: Not on file  Transportation Needs: Not on file  Physical Activity: Not on file  Stress: Not on file  Social Connections: Not on file  Intimate Partner Violence: Not on file    FAMILY HISTORY: Family History  Problem Relation Age of Onset   Stroke Mother    Hypertension Mother    Liver disease Mother        autoimmune   Heart disease Mother    Colon polyps Mother    Stroke Father    Hypertension Father    Hypertension Maternal Grandmother    Heart disease Maternal Grandmother    Bone cancer Paternal Grandfather    Bone cancer Maternal Uncle    Arthritis Paternal Uncle    Breast cancer Cousin     ALLERGIES:  is allergic to codeine.  MEDICATIONS:  Current Outpatient Medications  Medication Sig Dispense Refill   aspirin EC 81 MG EC tablet Take 1 tablet (81 mg total) by mouth daily.     buPROPion (WELLBUTRIN) 75 MG tablet Take 1  tablet (75 mg total) by mouth daily. (Patient taking differently: Take 75 mg by mouth daily. Currently taking every other day) 30 tablet 0   carvedilol (COREG) 6.25 MG tablet Take 1 tablet (6.25 mg total) by mouth 2 (two) times daily. 180 tablet 3   Cholecalciferol (D3) 50 MCG (2000 UT) TABS Take 1 tablet by mouth daily.     diclofenac Sodium (VOLTAREN) 1 % GEL Apply 4 g topically 4 (four) times daily as needed. 100 g 1   ferrous sulfate 325 (65 FE) MG EC tablet Take 1 tablet (325 mg total) by mouth 2 (two) times daily. 180 tablet 3   fluticasone (FLONASE)  50 MCG/ACT nasal spray USE 1 SPRAY(S) IN EACH NOSTRIL ONCE DAILY AS NEEDED FOR ALLERGIES OR RHINITIS 16 g 5   furosemide (LASIX) 40 MG tablet Take 1 tablet by mouth once daily 90 tablet 3   levothyroxine (EUTHYROX) 100 MCG tablet Take 1 tablet (100 mcg total) by mouth daily before breakfast. 90 tablet 1   lisinopril (ZESTRIL) 5 MG tablet TAKE 1 TABLET BY MOUTH AT BEDTIME 90 tablet 3   mirtazapine (REMERON) 15 MG tablet TAKE 1 TABLET BY MOUTH ONCE DAILY AT BEDTIME 90 tablet 0   rosuvastatin (CRESTOR) 40 MG tablet Take 1 tablet (40 mg total) by mouth daily. 90 tablet 3   traZODone (DESYREL) 150 MG tablet TAKE 1 TABLET BY MOUTH ONCE DAILY AT BEDTIME 90 tablet 0   No current facility-administered medications for this visit.    REVIEW OF SYSTEMS:   Constitutional: ( - ) fevers, ( - )  chills , ( - ) night sweats Eyes: ( - ) blurriness of vision, ( - ) double vision, ( - ) watery eyes Ears, nose, mouth, throat, and face: ( - ) mucositis, ( - ) sore throat Respiratory: ( - ) cough, ( - ) dyspnea, ( - ) wheezes Cardiovascular: ( - ) palpitation, ( - ) chest discomfort, ( - ) lower extremity swelling Gastrointestinal:  ( - ) nausea, ( - ) heartburn, ( - ) change in bowel habits Skin: ( - ) abnormal skin rashes Lymphatics: ( - ) new lymphadenopathy, ( - ) easy bruising Neurological: ( - ) numbness, ( +) tingling, ( - ) new  weaknesses Behavioral/Psych: ( - ) mood change, ( - ) new changes  All other systems were reviewed with the patient and are negative.  PHYSICAL EXAMINATION: ECOG PERFORMANCE STATUS: 0 - Asymptomatic  Vitals:   11/15/21 1116  BP: (!) 150/90  Pulse: 72  Resp: 17  Temp: 97.7 F (36.5 C)  SpO2: 100%   Filed Weights   11/15/21 1116  Weight: 141 lb 11.2 oz (64.3 kg)    GENERAL: well appearing female in NAD  SKIN: skin color, texture, turgor are normal, no rashes or significant lesions EYES: conjunctiva are pink and non-injected, sclera clear NECK: supple, non-tender LYMPH:  no palpable lymphadenopathy in the cervical or supraclavicular lymph nodes.  LUNGS: clear to auscultation and percussion with normal breathing effort HEART: regular rate & rhythm and no murmurs and no lower extremity edema ABDOMEN: soft, non-tender, non-distended, normal bowel sounds Musculoskeletal: no cyanosis of digits and no clubbing  PSYCH: alert & oriented x 3, fluent speech NEURO: no focal motor/sensory deficits  LABORATORY DATA:  I have reviewed the data as listed    Latest Ref Rng & Units 10/25/2021    9:47 AM 04/22/2021    9:27 AM 12/17/2020   12:00 AM  CBC  WBC 3.8 - 10.8 Thousand/uL 4.8   5.2   5.0    Hemoglobin 11.7 - 15.5 g/dL 13.4   13.4   12.2    Hematocrit 35.0 - 45.0 % 42.1   42.0   38.5    Platelets 140 - 400 Thousand/uL 148   163   156         Latest Ref Rng & Units 11/12/2021    8:57 AM 10/25/2021    9:47 AM 04/25/2021    1:43 PM  CMP  Glucose 65 - 139 mg/dL  71     BUN 7 - 25 mg/dL  23     Creatinine  0.50 - 1.03 mg/dL  1.13     Sodium 135 - 146 mmol/L  138     Potassium 3.5 - 5.3 mmol/L  3.8     Chloride 98 - 110 mmol/L  101     CO2 20 - 32 mmol/L  26     Calcium 8.6 - 10.4 mg/dL 10.2   10.9     CANCELED   10.3    Total Protein 6.1 - 8.1 g/dL 8.6   9.6     Total Bilirubin 0.2 - 1.2 mg/dL  0.3     AST 10 - 35 U/L  26     ALT 6 - 29 U/L  17        ASSESSMENT &  PLAN Miranda [name] is a 55 y.o. female who presents to the clinic for initial evaluation for elevated IgG levels.  We reviewed with the allergies that can cause elevated immunoglobulin levels including infectious process, inflammatory process and bone marrow disorders.  Upon review of prior labs, there is no evidence of monoclonal protein is reassuring.  We will proceed with serologic work-up today to check CBC, CMP, SPEP with IFE, serum free light chains, UPEP, C-reactive protein and sedimentation rate.  #Elevated IgG levels: --Etiology unknown --Labs today to check CBC, CMP, SPEP with IFE, serum free light chains, UPEP, C-reactive protein and sedimentation rate. --Determine if bone marrow biopsy is needed based on today's labs. --RTC based on above workup  Orders Placed This Encounter  Procedures   CBC with Differential ([place] Only)    Standing Status:   Future    Number of Occurrences:   1    Standing Expiration Date:   11/16/2022   CMP ([place] only)    Standing Status:   Future    Number of Occurrences:   1    Standing Expiration Date:   11/16/2022   Multiple Myeloma Panel (SPEP&IFE w/QIG)    Standing Status:   Future    Number of Occurrences:   1    Standing Expiration Date:   11/15/2022   Kappa/lambda light chains    Standing Status:   Future    Number of Occurrences:   1    Standing Expiration Date:   11/15/2022   24-Hr Ur UPEP/UIFE/Light Chains/TP    Standing Status:   Future    Standing Expiration Date:   11/15/2022   Sedimentation rate    Standing Status:   Future    Number of Occurrences:   1    Standing Expiration Date:   11/15/2022   C-reactive protein    Standing Status:   Future    Number of Occurrences:   1    Standing Expiration Date:   11/15/2022    All questions were answered. The patient knows to call the clinic with any problems, questions or concerns.  I have spent a total of 60 minutes minutes of face-to-face and non-face-to-face time,  preparing to see the patient, obtaining and/or reviewing separately obtained history, performing a medically appropriate examination, counseling and educating the patient, ordering tests, documenting clinical information in the electronic health record, and care coordination.   Dede Query, PA-C Department of Hematology/Oncology [place] at [facility] Phone: [phone]  Patient was seen with Dr. Lorenso Courier  I have read the above note and personally examined the patient. I agree with the assessment and plan as noted above.  Briefly Miranda [name] is a 56 year old female who presents for evaluation of  elevated IgG.  At this time the etiology of her elevated IgG is unclear.  We will proceed with a full monoclonal gammopathy work-up to assure that this is not the result of a monoclonal gammopathy.  At this time we have a low clinical suspicion that that is the case.  We will plan to have the patient return to clinic if there are findings concerning for MGUS to need more work-up.  In the event that there is no monoclonal component would recommend routine follow-up with PCP.   Ledell Peoples, MD Department of Hematology/Oncology [place] at [facility] Phone: [phone] Pager: [phone] Email: [email or web address]

## 2021-11-15 ENCOUNTER — Inpatient Hospital Stay: Payer: HMO | Attending: Physician Assistant | Admitting: Physician Assistant

## 2021-11-15 ENCOUNTER — Inpatient Hospital Stay: Payer: HMO

## 2021-11-15 ENCOUNTER — Encounter: Payer: Self-pay | Admitting: Physician Assistant

## 2021-11-15 ENCOUNTER — Other Ambulatory Visit: Payer: Self-pay

## 2021-11-15 VITALS — BP 150/90 | HR 72 | Temp 97.7°F | Resp 17 | Ht 65.0 in | Wt 141.7 lb

## 2021-11-15 DIAGNOSIS — I2510 Atherosclerotic heart disease of native coronary artery without angina pectoris: Secondary | ICD-10-CM | POA: Insufficient documentation

## 2021-11-15 DIAGNOSIS — R778 Other specified abnormalities of plasma proteins: Secondary | ICD-10-CM

## 2021-11-15 DIAGNOSIS — E039 Hypothyroidism, unspecified: Secondary | ICD-10-CM | POA: Diagnosis not present

## 2021-11-15 DIAGNOSIS — I110 Hypertensive heart disease with heart failure: Secondary | ICD-10-CM | POA: Diagnosis not present

## 2021-11-15 DIAGNOSIS — I5040 Unspecified combined systolic (congestive) and diastolic (congestive) heart failure: Secondary | ICD-10-CM | POA: Insufficient documentation

## 2021-11-15 LAB — CBC WITH DIFFERENTIAL (CANCER CENTER ONLY)
Abs Immature Granulocytes: 0.01 10*3/uL (ref 0.00–0.07)
Basophils Absolute: 0 10*3/uL (ref 0.0–0.1)
Basophils Relative: 0 %
Eosinophils Absolute: 0.1 10*3/uL (ref 0.0–0.5)
Eosinophils Relative: 1 %
HCT: 36.7 % (ref 36.0–46.0)
Hemoglobin: 11.7 g/dL — ABNORMAL LOW (ref 12.0–15.0)
Immature Granulocytes: 0 %
Lymphocytes Relative: 43 %
Lymphs Abs: 2.1 10*3/uL (ref 0.7–4.0)
MCH: 27.9 pg (ref 26.0–34.0)
MCHC: 31.9 g/dL (ref 30.0–36.0)
MCV: 87.4 fL (ref 80.0–100.0)
Monocytes Absolute: 0.2 10*3/uL (ref 0.1–1.0)
Monocytes Relative: 5 %
Neutro Abs: 2.4 10*3/uL (ref 1.7–7.7)
Neutrophils Relative %: 51 %
Platelet Count: 147 10*3/uL — ABNORMAL LOW (ref 150–400)
RBC: 4.2 MIL/uL (ref 3.87–5.11)
RDW: 15.1 % (ref 11.5–15.5)
WBC Count: 4.9 10*3/uL (ref 4.0–10.5)
nRBC: 0 % (ref 0.0–0.2)

## 2021-11-15 LAB — CMP (CANCER CENTER ONLY)
ALT: 20 U/L (ref 0–44)
AST: 34 U/L (ref 15–41)
Albumin: 4.5 g/dL (ref 3.5–5.0)
Alkaline Phosphatase: 54 U/L (ref 38–126)
Anion gap: 6 (ref 5–15)
BUN: 14 mg/dL (ref 6–20)
CO2: 27 mmol/L (ref 22–32)
Calcium: 10.1 mg/dL (ref 8.9–10.3)
Chloride: 106 mmol/L (ref 98–111)
Creatinine: 0.94 mg/dL (ref 0.44–1.00)
GFR, Estimated: >60 mL/min (ref >60)
Glucose, Bld: 78 mg/dL (ref 70–99)
Potassium: 3.7 mmol/L (ref 3.5–5.1)
Sodium: 139 mmol/L (ref 135–145)
Total Bilirubin: 0.3 mg/dL (ref 0.3–1.2)
Total Protein: 9.4 g/dL — ABNORMAL HIGH (ref 6.5–8.1)

## 2021-11-15 LAB — PTH, INTACT AND CALCIUM
Calcium: 10.2 mg/dL (ref 8.6–10.4)
PTH: 28 pg/mL (ref 16–77)

## 2021-11-15 LAB — PROTEIN ELECTROPHORESIS, SERUM
Albumin ELP: 4.2 g/dL (ref 3.8–4.8)
Alpha 1: 0.3 g/dL (ref 0.2–0.3)
Alpha 2: 0.9 g/dL (ref 0.5–0.9)
Beta 2: 0.4 g/dL (ref 0.2–0.5)
Beta Globulin: 0.4 g/dL (ref 0.4–0.6)
Gamma Globulin: 2.3 g/dL — ABNORMAL HIGH (ref 0.8–1.7)
Total Protein: 8.6 g/dL — ABNORMAL HIGH (ref 6.1–8.1)

## 2021-11-15 LAB — IMMUNOFIXATION ELECTROPHORESIS
IgG (Immunoglobin G), Serum: 2521 mg/dL — ABNORMAL HIGH (ref 600–1640)
IgM, Serum: 112 mg/dL (ref 50–300)
Immunofix Electr Int: NOT DETECTED
Immunoglobulin A: 302 mg/dL (ref 47–310)

## 2021-11-15 LAB — SEDIMENTATION RATE: Sed Rate: 49 mm/hr — ABNORMAL HIGH (ref 0–22)

## 2021-11-15 LAB — C-REACTIVE PROTEIN: CRP: 0.6 mg/dL (ref <1.0)

## 2021-11-18 DIAGNOSIS — R778 Other specified abnormalities of plasma proteins: Secondary | ICD-10-CM | POA: Diagnosis not present

## 2021-11-18 LAB — MULTIPLE MYELOMA PANEL, SERUM
Albumin SerPl Elph-Mcnc: 3.8 g/dL (ref 2.9–4.4)
Albumin/Glob SerPl: 0.8 (ref 0.7–1.7)
Alpha 1: 0.2 g/dL (ref 0.0–0.4)
Alpha2 Glob SerPl Elph-Mcnc: 0.9 g/dL (ref 0.4–1.0)
B-Globulin SerPl Elph-Mcnc: 1.1 g/dL (ref 0.7–1.3)
Gamma Glob SerPl Elph-Mcnc: 2.5 g/dL — ABNORMAL HIGH (ref 0.4–1.8)
Globulin, Total: 4.8 g/dL — ABNORMAL HIGH (ref 2.2–3.9)
IgA: 304 mg/dL (ref 87–352)
IgG (Immunoglobin G), Serum: 2449 mg/dL — ABNORMAL HIGH (ref 586–1602)
IgM (Immunoglobulin M), Srm: 113 mg/dL (ref 26–217)
Total Protein ELP: 8.6 g/dL — ABNORMAL HIGH (ref 6.0–8.5)

## 2021-11-18 LAB — KAPPA/LAMBDA LIGHT CHAINS
Kappa free light chain: 58.3 mg/L — ABNORMAL HIGH (ref 3.3–19.4)
Kappa, lambda light chain ratio: 1.82 — ABNORMAL HIGH (ref 0.26–1.65)
Lambda free light chains: 32.1 mg/L — ABNORMAL HIGH (ref 5.7–26.3)

## 2021-11-19 ENCOUNTER — Other Ambulatory Visit: Payer: Self-pay

## 2021-11-19 DIAGNOSIS — R778 Other specified abnormalities of plasma proteins: Secondary | ICD-10-CM

## 2021-11-21 ENCOUNTER — Encounter: Payer: Self-pay | Admitting: Nurse Practitioner

## 2021-11-21 ENCOUNTER — Ambulatory Visit (INDEPENDENT_AMBULATORY_CARE_PROVIDER_SITE_OTHER): Payer: HMO | Admitting: Nurse Practitioner

## 2021-11-21 DIAGNOSIS — Z0000 Encounter for general adult medical examination without abnormal findings: Secondary | ICD-10-CM | POA: Diagnosis not present

## 2021-11-21 LAB — UPEP/UIFE/LIGHT CHAINS/TP, 24-HR UR
% BETA, Urine: 11.7 %
ALPHA 1 URINE: 4.8 %
Albumin, U: 73.3 %
Alpha 2, Urine: 5.8 %
Free Kappa Lt Chains,Ur: 21.31 mg/L (ref 1.17–86.46)
Free Kappa/Lambda Ratio: 4.09 (ref 1.83–14.26)
Free Lambda Lt Chains,Ur: 5.21 mg/L (ref 0.27–15.21)
GAMMA GLOBULIN URINE: 4.4 %
Total Protein, Urine-Ur/day: 200 mg/24 hr — ABNORMAL HIGH (ref 30–150)
Total Protein, Urine: 16.7 mg/dL
Total Volume: 1200

## 2021-11-21 NOTE — Patient Instructions (Addendum)
[name] , Thank you for taking time to come for your Medicare Wellness Visit. I appreciate your ongoing commitment to your health goals. Please review the following plan we discussed and let me know if I can assist you in the future.   Screening recommendations/referrals: Colonoscopy up to date Mammogram please schedule- [phone] Bone Density due at 40 Recommended yearly ophthalmology/optometry visit for glaucoma screening and checkup Recommended yearly dental visit for hygiene and checkup  Vaccinations: Influenza vaccine up to date Pneumococcal vaccine due at 65 Tdap vaccine up to date Shingles vaccine up to date    Advanced directives: recommend to complete and bring back to office so we can place on file.   Conditions/risks identified: depression  Next appointment: yearly for awv   Preventive Care 58 Years and Older, Female Preventive care refers to lifestyle choices and visits with your health care provider that can promote health and wellness. What does preventive care include? A yearly physical exam. This is also called an annual well check. Dental exams once or twice a year. Routine eye exams. Ask your health care provider how often you should have your eyes checked. Personal lifestyle choices, including: Daily care of your teeth and gums. Regular physical activity. Eating a healthy diet. Avoiding tobacco and drug use. Limiting alcohol use. Practicing safe sex. Taking low-dose aspirin every day. Taking vitamin and mineral supplements as recommended by your health care provider. What happens during an annual well check? The services and screenings done by your health care provider during your annual well check will depend on your age, overall health, lifestyle risk factors, and family history of disease. Counseling  Your health care provider may ask you questions about your: Alcohol use. Tobacco use. Drug use. Emotional well-being. Home and relationship  well-being. Sexual activity. Eating habits. History of falls. Memory and ability to understand (cognition). Work and work Statistician. Reproductive health. Screening  You may have the following tests or measurements: Height, weight, and BMI. Blood pressure. Lipid and cholesterol levels. These may be checked every 5 years, or more frequently if you are over 41 years old. Skin check. Lung cancer screening. You may have this screening every year starting at age 58 if you have a 30-pack-year history of smoking and currently smoke or have quit within the past 15 years. Fecal occult blood test (FOBT) of the stool. You may have this test every year starting at age 58. Flexible sigmoidoscopy or colonoscopy. You may have a sigmoidoscopy every 5 years or a colonoscopy every 10 years starting at age 58. Hepatitis C blood test. Hepatitis B blood test. Sexually transmitted disease (STD) testing. Diabetes screening. This is done by checking your blood sugar (glucose) after you have not eaten for a while (fasting). You may have this done every 1-3 years. Bone density scan. This is done to screen for osteoporosis. You may have this done starting at age 58. Mammogram. This may be done every 1-2 years. Talk to your health care provider about how often you should have regular mammograms. Talk with your health care provider about your test results, treatment options, and if necessary, the need for more tests. Vaccines  Your health care provider may recommend certain vaccines, such as: Influenza vaccine. This is recommended every year. Tetanus, diphtheria, and acellular pertussis (Tdap, Td) vaccine. You may need a Td booster every 10 years. Zoster vaccine. You may need this after age 58. Pneumococcal 13-valent conjugate (PCV13) vaccine. One dose is recommended after age 58. Pneumococcal polysaccharide (PPSV23) vaccine.  One dose is recommended after age 58. Talk to your health care provider about which  screenings and vaccines you need and how often you need them. This information is not intended to replace advice given to you by your health care provider. Make sure you discuss any questions you have with your health care provider. Document Released: 08/17/2015 Document Revised: 04/09/2016 Document Reviewed: 05/22/2015 Elsevier Interactive Patient Education  2017 [place] Prevention in the Home Falls can cause injuries. They can happen to people of all ages. There are many things you can do to make your home safe and to help prevent falls. What can I do on the outside of my home? Regularly fix the edges of walkways and driveways and fix any cracks. Remove anything that might make you trip as you walk through a door, such as a raised step or threshold. Trim any bushes or trees on the path to your home. Use bright outdoor lighting. Clear any walking paths of anything that might make someone trip, such as rocks or tools. Regularly check to see if handrails are loose or broken. Make sure that both sides of any steps have handrails. Any raised decks and porches should have guardrails on the edges. Have any leaves, snow, or ice cleared regularly. Use sand or salt on walking paths during winter. Clean up any spills in your garage right away. This includes oil or grease spills. What can I do in the bathroom? Use night lights. Install grab bars by the toilet and in the tub and shower. Do not use towel bars as grab bars. Use non-skid mats or decals in the tub or shower. If you need to sit down in the shower, use a plastic, non-slip stool. Keep the floor dry. Clean up any water that spills on the floor as soon as it happens. Remove soap buildup in the tub or shower regularly. Attach bath mats securely with double-sided non-slip rug tape. Do not have throw rugs and other things on the floor that can make you trip. What can I do in the bedroom? Use night lights. Make sure that you have a  light by your bed that is easy to reach. Do not use any sheets or blankets that are too big for your bed. They should not hang down onto the floor. Have a firm chair that has side arms. You can use this for support while you get dressed. Do not have throw rugs and other things on the floor that can make you trip. What can I do in the kitchen? Clean up any spills right away. Avoid walking on wet floors. Keep items that you use a lot in easy-to-reach places. If you need to reach something above you, use a strong step stool that has a grab bar. Keep electrical cords out of the way. Do not use floor polish or wax that makes floors slippery. If you must use wax, use non-skid floor wax. Do not have throw rugs and other things on the floor that can make you trip. What can I do with my stairs? Do not leave any items on the stairs. Make sure that there are handrails on both sides of the stairs and use them. Fix handrails that are broken or loose. Make sure that handrails are as long as the stairways. Check any carpeting to make sure that it is firmly attached to the stairs. Fix any carpet that is loose or worn. Avoid having throw rugs at the top or bottom of  the stairs. If you do have throw rugs, attach them to the floor with carpet tape. Make sure that you have a light switch at the top of the stairs and the bottom of the stairs. If you do not have them, ask someone to add them for you. What else can I do to help prevent falls? Wear shoes that: Do not have high heels. Have rubber bottoms. Are comfortable and fit you well. Are closed at the toe. Do not wear sandals. If you use a stepladder: Make sure that it is fully opened. Do not climb a closed stepladder. Make sure that both sides of the stepladder are locked into place. Ask someone to hold it for you, if possible. Clearly mark and make sure that you can see: Any grab bars or handrails. First and last steps. Where the edge of each step  is. Use tools that help you move around (mobility aids) if they are needed. These include: Canes. Walkers. Scooters. Crutches. Turn on the lights when you go into a dark area. Replace any light bulbs as soon as they burn out. Set up your furniture so you have a clear path. Avoid moving your furniture around. If any of your floors are uneven, fix them. If there are any pets around you, be aware of where they are. Review your medicines with your doctor. Some medicines can make you feel dizzy. This can increase your chance of falling. Ask your doctor what other things that you can do to help prevent falls. This information is not intended to replace advice given to you by your health care provider. Make sure you discuss any questions you have with your health care provider. Document Released: 05/17/2009 Document Revised: 12/27/2015 Document Reviewed: 08/25/2014 Elsevier Interactive Patient Education  2017 [employer].

## 2021-11-21 NOTE — Progress Notes (Signed)
Subjective:   Miranda [name] is a 56 y.o. female who presents for Medicare Annual (Subsequent) preventive examination.  Review of Systems     Cardiac Risk Factors include: sedentary lifestyle;advanced age (>31mn, >>73women);family history of premature cardiovascular disease;hypertension;dyslipidemia     Objective:    Today's Vitals   11/21/21 1059  PainSc: 3    There is no height or weight on file to calculate BMI.     11/21/2021   10:49 AM 11/15/2021   11:25 AM 04/22/2021    8:57 AM 12/19/2020    9:11 AM 11/13/2020    9:36 AM 09/03/2020    8:39 AM 11/22/2019   11:16 AM  Advanced Directives  Does Patient Have a Medical Advance Directive? Yes No No No No No No  Does patient want to make changes to medical advance directive? No - Patient declined        Would patient like information on creating a medical advance directive?   Yes (MAU/Ambulatory/Procedural Areas - Information given) No - Patient declined No - Patient declined No - Patient declined No - Patient declined    Current Medications (verified) Outpatient Encounter Medications as of 11/21/2021  Medication Sig   aspirin EC 81 MG EC tablet Take 1 tablet (81 mg total) by mouth daily.   buPROPion (WELLBUTRIN) 75 MG tablet Take 1 tablet (75 mg total) by mouth daily. (Patient taking differently: Take 75 mg by mouth daily. Currently taking every other day)   carvedilol (COREG) 6.25 MG tablet Take 1 tablet (6.25 mg total) by mouth 2 (two) times daily.   Cholecalciferol (D3) 50 MCG (2000 UT) TABS Take 1 tablet by mouth daily.   diclofenac Sodium (VOLTAREN) 1 % GEL Apply 4 g topically 4 (four) times daily as needed.   ferrous sulfate 325 (65 FE) MG EC tablet Take 1 tablet (325 mg total) by mouth 2 (two) times daily.   fluticasone (FLONASE) 50 MCG/ACT nasal spray USE 1 SPRAY(S) IN EACH NOSTRIL ONCE DAILY AS NEEDED FOR ALLERGIES OR RHINITIS   furosemide (LASIX) 40 MG tablet Take 1 tablet by mouth once daily   levothyroxine  (EUTHYROX) 100 MCG tablet Take 1 tablet (100 mcg total) by mouth daily before breakfast.   lisinopril (ZESTRIL) 5 MG tablet TAKE 1 TABLET BY MOUTH AT BEDTIME   mirtazapine (REMERON) 15 MG tablet TAKE 1 TABLET BY MOUTH ONCE DAILY AT BEDTIME   rosuvastatin (CRESTOR) 40 MG tablet Take 1 tablet (40 mg total) by mouth daily.   traZODone (DESYREL) 150 MG tablet TAKE 1 TABLET BY MOUTH ONCE DAILY AT BEDTIME   No facility-administered encounter medications on file as of 11/21/2021.    Allergies (verified) Codeine   History: Past Medical History:  Diagnosis Date   Anemia    Anxiety    At risk for sudden cardiac death 3Apr 21, 2014  CHF - Combined Systolic + Diastolic Dysfunction. EF of 15-20% with Grade II diastolic dysfunction on echo 10/27/12 10/28/2012   Coronary artery disease    Depression    Dyspnea    Fibroids    Heart murmur    Hypertension    Hypothyroidism    S/P cardiac catheterization, 304/21/14 normal coronaries with minimal luminal irregularities in RCA system 10/30/2012   Thyroid disease    hypothyroidism   Past Surgical History:  Procedure Laterality Date   AORTIC VALVE REPLACEMENT N/A 03/12/2017   Procedure: AORTIC VALVE REPLACEMENT (AVR) with Mitral Annuloplasty;  Surgeon: BGaye Pollack MD;  Location[place] [facility]  Service:  Open Heart Surgery;  Laterality: N/A;   LEFT AND RIGHT HEART CATHETERIZATION WITH CORONARY ANGIOGRAM N/A 10/29/2012   Procedure: LEFT AND RIGHT HEART CATHETERIZATION WITH CORONARY ANGIOGRAM;  Surgeon: Leonie Man, MD;  Location: [facility] CATH [facility];  Service: Cardiovascular;  Laterality: N/A;   MITRAL VALVE REPAIR N/A 03/12/2017   Procedure: MITRAL VALVE REPAIR (MVR);  Surgeon: Gaye Pollack, MD;  Location: [facility];  Service: Open Heart Surgery;  Laterality: N/A;  mitral annuloplasty   MULTIPLE EXTRACTIONS WITH ALVEOLOPLASTY N/A 01/21/2017   Procedure: Extraction of tooth #'s 4-13 with alveoloplasty;  Surgeon: Lenn Cal, DDS;  Location: [facility];  Service: Oral  Surgery;  Laterality: N/A;   RIGHT/LEFT HEART CATH AND CORONARY ANGIOGRAPHY N/A 01/09/2017   Procedure: Right/Left Heart Cath and Coronary Angiography;  Surgeon: Troy Sine, MD;  Location: [place] [facility] CV LAB;  Service: Cardiovascular;  Laterality: N/A;   TEE WITHOUT CARDIOVERSION N/A 03/12/2017   Procedure: TRANSESOPHAGEAL ECHOCARDIOGRAM (TEE);  Surgeon: Gaye Pollack, MD;  Location: [facility];  Service: Open Heart Surgery;  Laterality: N/A;   Family History  Problem Relation Age of Onset   Stroke Mother    Hypertension Mother    Liver disease Mother        autoimmune   Heart disease Mother    Colon polyps Mother    Stroke Father    Hypertension Father    Hypertension Maternal Grandmother    Heart disease Maternal Grandmother    Bone cancer Paternal Grandfather    Bone cancer Maternal Uncle    Arthritis Paternal Uncle    Breast cancer Cousin    Social History   Socioeconomic History   Marital status: Single    Spouse name: Not on file   Number of children: 1   Years of education: 12th   Highest education level: Not on file  Occupational History   Occupation: unemployed    Employer: [employer]  Tobacco Use   Smoking status: Former    Packs/day: 0.50    Years: 5.00    Pack years: 2.50    Types: Cigars, Cigarettes    Quit date: 08/04/1978    Years since quitting: 43.3   Smokeless tobacco: Never   Tobacco comments:    1 cigar daily-quit 10/24/12    Vaping Use   Vaping Use: Never used  Substance and Sexual Activity   Alcohol use: Yes    Alcohol/week: 3.0 standard drinks    Types: 3 Glasses of wine per week   Drug use: Not Currently    Frequency: 5.0 times per week    Types: Marijuana    Comment: few weeks ago last   Sexual activity: Not Currently  Other Topics Concern   Not on file  Social History Narrative   Patient lives at home alone. (Condo)   Caffeine use: 1/2 soda daily   Diet   Martial status:single   Is it one or more stories? Yes   How many  persons live in your home? 1   Do you have any pets in your home? No   Current or past profession: Biochemist, clinical   Do you exercise? No   Do you have a living will? No    Do you have a DNR form? No    Do you have signed POA/HPOA forms? No    Social Determinants of Radio broadcast assistant Strain: Not on file  Food Insecurity: Not on file  Transportation Needs: Not on file  Physical Activity: Not on file  Stress: Not on file  Social Connections: Not on file    Tobacco Counseling Counseling given: Not Answered Tobacco comments: 1 cigar daily-quit 10/24/12     Clinical Intake:  Pre-visit preparation completed: Yes  Pain Score: 3  Pain Location: Shoulder Pain Orientation: Left     BMI - recorded: 23 Nutritional Risks: None Diabetes: No  How often do you need to have someone help you when you read instructions, pamphlets, or other written materials from your doctor or pharmacy?: 1 - Never  Diabetic?no         Activities of Daily Living    11/21/2021   10:51 AM  In your present state of health, do you have any difficulty performing the following activities:  Hearing? 0  Vision? 0  Difficulty concentrating or making decisions? 1  Comment memory issues  Walking or climbing stairs? 0  Dressing or bathing? 0  Doing errands, shopping? 0  Preparing Food and eating ? N  Using the Toilet? N  In the past six months, have you accidently leaked urine? Y  Do you have problems with loss of bowel control? N  Managing your Medications? N  Managing your Finances? N  Housekeeping or managing your Housekeeping? Y    Patient Care Team: Lauree Chandler, NP as PCP - General (Geriatric Medicine) Jerline Pain, MD as PCP - Cardiology (Cardiology) Emily Filbert, MD (Inactive) as Consulting Physician (Obstetrics and Gynecology) Haroldine Laws, Shaune Pascal, MD as Consulting Physician (Cardiology) Penni Bombard, MD as Consulting Physician (Neurology)  Indicate any recent  Medical Services you may have received from other than [facility] providers in the past year (date may be approximate).     Assessment:   This is a routine wellness examination for Miranda [name].  Hearing/Vision screen Hearing Screening - Comments:: Patient has no hearing problems Vision Screening - Comments:: Patient has vision problems. Patient has eye exam every other year. Patient can't remember eye doctor's name.  Dietary issues and exercise activities discussed: Current Exercise Habits: The patient does not participate in regular exercise at present   Goals Addressed   None    Depression Screen    11/21/2021   10:43 AM 10/25/2021    9:08 AM 11/13/2020    9:34 AM 09/03/2020    8:39 AM 11/22/2019   10:36 AM 11/11/2019    9:01 AM 07/18/2019    1:26 PM  PHQ 2/9 Scores  PHQ - 2 Score 3 0 0 0 0 0 0  PHQ- 9 Score 8        Exception Documentation  Other- indicate reason in comment box       Not completed  Connected with someone from [place]    11/21/2021   10:48 AM 10/25/2021    9:08 AM 11/13/2020    9:35 AM 09/03/2020    8:39 AM 11/11/2019    9:01 AM  [place] in the past year? 0 0 0 0 0  Number falls in past yr: 0 0 0 0 0  Injury with Fall? 0 0 0 0 0  Risk for fall due to : No Fall Risks No Fall Risks     Follow up Falls evaluation completed Falls evaluation completed       [place]:  Any stairs in or around the home? Yes  If so, are there any without handrails?  Yes  Home free of loose throw rugs in walkways, pet beds, electrical cords, etc? Yes  Adequate lighting in your home to reduce risk of falls? Yes   ASSISTIVE DEVICES UTILIZED TO PREVENT FALLS:  Life alert? No  Use of a cane, walker or w/c? No  Grab bars in the bathroom? Yes  Shower chair or bench in shower? No  Elevated toilet seat or a handicapped toilet? Yes   TIMED UP AND GO:  Was the test performed? No .    Cognitive Function:    11/03/2016     9:34 AM  MMSE - Mini Mental State Exam  Not completed: Unable to complete        11/21/2021   10:49 AM 11/13/2020    9:37 AM 11/11/2019    9:03 AM 11/09/2018   10:30 AM  6CIT Screen  What Year? 0 points 0 points 0 points 0 points  What month? 0 points 0 points 0 points 0 points  What time? 0 points 0 points 0 points 0 points  Count back from 20 0 points 0 points 0 points 0 points  Months in reverse 0 points 0 points 0 points 0 points  Repeat phrase 0 points 0 points 0 points 4 points  Total Score 0 points 0 points 0 points 4 points    Immunizations Immunization History  Administered Date(s) Administered   Hep A / Hep B 09/08/2017, 10/09/2017, 03/11/2018   Influenza, High Dose Seasonal PF 05/24/2018   Influenza,inj,Quad PF,6+ Mos 05/26/2016, 05/07/2017, 06/13/2019, 04/19/2020, 04/22/2021   PFIZER(Purple Top)SARS-COV-2 Vaccination 10/20/2019, 11/10/2019, 05/26/2020, 11/05/2020   Pfizer Covid-19 Vaccine Bivalent Booster 54yr & up 10/16/2021   Pneumococcal Conjugate-13 12/14/2014   Pneumococcal Polysaccharide-23 01/30/2017   Tdap 01/25/2016   Zoster Recombinat (Shingrix) 11/06/2017, 01/28/2018    TDAP status: Up to date  Flu Vaccine status: Up to date  Pneumococcal vaccine status: Up to date  Covid-19 vaccine status: Completed vaccines  Qualifies for Shingles Vaccine? Yes   Zostavax completed No   Shingrix Completed?: Yes  Screening Tests Health Maintenance  Topic Date Due   MAMMOGRAM  09/01/2021   INFLUENZA VACCINE  03/04/2022   PAP SMEAR-Modifier  06/12/2022   Fecal DNA (Cologuard)  09/14/2022   TETANUS/TDAP  01/24/2026   COVID-19 Vaccine  Completed   Hepatitis C Screening  Completed   HIV Screening  Completed   Zoster Vaccines- Shingrix  Completed   HPV VACCINES  Aged Out    Health Maintenance  Health Maintenance Due  Topic Date Due   MAMMOGRAM  09/01/2021    Colorectal cancer screening: Type of screening: Cologuard. Completed 09/05/2019. Repeat every  3 years  Mammogram status: Completed 09/02/19. Repeat every year   Lung Cancer Screening: (Low Dose CT Chest recommended if Age 431-80years, 30 pack-year currently smoking OR have quit w/in 15years.) does not qualify.   Lung Cancer Screening Referral: na  Additional Screening:  Hepatitis C Screening: does qualify; Completed 2019  Vision Screening: Recommended annual ophthalmology exams for early detection of glaucoma and other disorders of the eye. Is the patient up to date with their annual eye exam?  Yes  Who is the provider or what is the name of the office in which the patient attends annual eye exams? [facility] If pt is not established with a provider, would they like to be referred to a provider to establish care? No .   Dental Screening: Recommended annual dental exams for proper oral hygiene  Community Resource Referral / Chronic  Care Management: CRR required this visit?  No   CCM required this visit?  No      Plan:     I have personally reviewed and noted the following in the patient's chart:   Medical and social history Use of alcohol, tobacco or illicit drugs  Current medications and supplements including opioid prescriptions.  Functional ability and status Nutritional status Physical activity Advanced directives List of other physicians Hospitalizations, surgeries, and ER visits in previous 12 months Vitals Screenings to include cognitive, depression, and falls Referrals and appointments  In addition, I have reviewed and discussed with patient certain preventive protocols, quality metrics, and best practice recommendations. A written personalized care plan for preventive services as well as general preventive health recommendations were provided to patient.     Lauree Chandler, NP   11/21/2021    Virtual Visit via Telephone Note  I connected with patient 11/21/21 at 10:40 AM EDT by telephone and verified that I am speaking with the correct person using  two identifiers.  Location: Patient: home Provider: [facility]   I discussed the limitations, risks, security and privacy concerns of performing an evaluation and management service by telephone and the availability of in person appointments. I also discussed with the patient that there may be a patient responsible charge related to this service. The patient expressed understanding and agreed to proceed.   I discussed the assessment and treatment plan with the patient. The patient was provided an opportunity to ask questions and all were answered. The patient agreed with the plan and demonstrated an understanding of the instructions.   The patient was advised to call back or seek an in-person evaluation if the symptoms worsen or if the condition fails to improve as anticipated.  I provided 16 minutes of non-face-to-face time during this encounter.  Carlos American. Harle Battiest Avs printed and mailed

## 2021-11-21 NOTE — Progress Notes (Signed)
This service is provided via telemedicine ? ?No vital signs collected/recorded due to the encounter was a telemedicine visit.  ? ?Location of patient (ex: home, work):  Home ? ?Patient consents to a telephone visit:  Yes, see encounter dated 11/21/2021 ? ?Location of the provider (ex: office, home):  [place] ? ?Name of any referring provider:  N/A ? ?Names of all persons participating in the telemedicine service and their role in the encounter:  Sherrie Mustache, Nurse Practitioner, Carroll Kinds, CMA, and patient.  ? ?Time spent on call:  10 minutes with medical assistant ? ?

## 2021-11-26 ENCOUNTER — Telehealth: Payer: Self-pay | Admitting: Physician Assistant

## 2021-11-26 NOTE — Telephone Encounter (Signed)
I called [name] to review the labs form 11/15/2021. Findings show polyclonal increase but no evidence of monoclonal protein. Reviewed underlying causes for elevated immunoglobulins including inflammatory processes.Sedimentation rate was elevated so recommend to evaluate for underlying inflammatory conditions. No indication for plasma cell disorders such as multiple myeloma based on the workup so do not recommend a bone marrow biopsy.  ? ?Patient will follow up with PCP and will be pending in our clinic. She expressed understanding of the plan provided.  ?

## 2021-12-09 ENCOUNTER — Other Ambulatory Visit: Payer: Self-pay | Admitting: Cardiology

## 2021-12-09 DIAGNOSIS — I952 Hypotension due to drugs: Secondary | ICD-10-CM

## 2022-01-04 ENCOUNTER — Other Ambulatory Visit: Payer: Self-pay | Admitting: Nurse Practitioner

## 2022-01-04 DIAGNOSIS — F5104 Psychophysiologic insomnia: Secondary | ICD-10-CM

## 2022-02-09 ENCOUNTER — Other Ambulatory Visit: Payer: Self-pay | Admitting: Nurse Practitioner

## 2022-03-06 DIAGNOSIS — E785 Hyperlipidemia, unspecified: Secondary | ICD-10-CM | POA: Diagnosis not present

## 2022-03-06 DIAGNOSIS — I10 Essential (primary) hypertension: Secondary | ICD-10-CM | POA: Diagnosis not present

## 2022-03-06 DIAGNOSIS — E039 Hypothyroidism, unspecified: Secondary | ICD-10-CM | POA: Diagnosis not present

## 2022-03-25 ENCOUNTER — Encounter: Payer: Self-pay | Admitting: *Deleted

## 2022-03-25 NOTE — Progress Notes (Signed)
[facility] Quality Team Note  Name: Miranda [name] Date of Birth: [birth year] MRN: [id number] Date: 03/25/2022  [facility] Quality Team has reviewed this patient's chart, please see recommendations below:  Controlling Blood Pressure;  Mammogram Scheduling;  [facility] Quality Other; (Pt has appointment on 04/28/2022.  Has open gaps for mammogram and blood pressure.  Can you follow up on these when pt comes in and if compliant, we can close the gaps.)

## 2022-04-15 ENCOUNTER — Emergency Department (HOSPITAL_COMMUNITY): Payer: HMO

## 2022-04-15 ENCOUNTER — Emergency Department (HOSPITAL_COMMUNITY)
Admission: EM | Admit: 2022-04-15 | Discharge: 2022-04-15 | Disposition: A | Discharge status: Home / Self Care | Payer: HMO | Attending: Emergency Medicine | Admitting: Emergency Medicine

## 2022-04-15 ENCOUNTER — Encounter (HOSPITAL_COMMUNITY): Payer: Self-pay | Admitting: Emergency Medicine

## 2022-04-15 DIAGNOSIS — Z7982 Long term (current) use of aspirin: Secondary | ICD-10-CM | POA: Diagnosis not present

## 2022-04-15 DIAGNOSIS — W1842XA Slipping, tripping and stumbling without falling due to stepping into hole or opening, initial encounter: Secondary | ICD-10-CM | POA: Diagnosis not present

## 2022-04-15 DIAGNOSIS — Y9319 Activity, other involving water and watercraft: Secondary | ICD-10-CM | POA: Insufficient documentation

## 2022-04-15 DIAGNOSIS — Z79899 Other long term (current) drug therapy: Secondary | ICD-10-CM | POA: Insufficient documentation

## 2022-04-15 DIAGNOSIS — S8251XA Displaced fracture of medial malleolus of right tibia, initial encounter for closed fracture: Secondary | ICD-10-CM | POA: Diagnosis not present

## 2022-04-15 DIAGNOSIS — I509 Heart failure, unspecified: Secondary | ICD-10-CM | POA: Diagnosis not present

## 2022-04-15 DIAGNOSIS — M7989 Other specified soft tissue disorders: Secondary | ICD-10-CM | POA: Diagnosis not present

## 2022-04-15 DIAGNOSIS — S99911A Unspecified injury of right ankle, initial encounter: Secondary | ICD-10-CM | POA: Diagnosis present

## 2022-04-15 DIAGNOSIS — S82851A Displaced trimalleolar fracture of right lower leg, initial encounter for closed fracture: Secondary | ICD-10-CM | POA: Insufficient documentation

## 2022-04-15 DIAGNOSIS — S82891A Other fracture of right lower leg, initial encounter for closed fracture: Secondary | ICD-10-CM

## 2022-04-15 DIAGNOSIS — I2510 Atherosclerotic heart disease of native coronary artery without angina pectoris: Secondary | ICD-10-CM | POA: Diagnosis not present

## 2022-04-15 DIAGNOSIS — I110 Hypertensive heart disease with heart failure: Secondary | ICD-10-CM | POA: Diagnosis not present

## 2022-04-15 DIAGNOSIS — S8262XA Displaced fracture of lateral malleolus of left fibula, initial encounter for closed fracture: Secondary | ICD-10-CM | POA: Diagnosis not present

## 2022-04-15 MED ORDER — ONDANSETRON HCL 4 MG/2ML IJ SOLN
4.0000 mg | Freq: Once | INTRAMUSCULAR | Status: AC
  Administered 2022-04-15: 4 mg via INTRAVENOUS
  Filled 2022-04-15: qty 2

## 2022-04-15 MED ORDER — PROPOFOL 10 MG/ML IV BOLUS
0.5000 mg/kg | Freq: Once | INTRAVENOUS | Status: DC
  Filled 2022-04-15: qty 20

## 2022-04-15 MED ORDER — LACTATED RINGERS IV BOLUS
1000.0000 mL | Freq: Once | INTRAVENOUS | Status: AC
  Administered 2022-04-15: 1000 mL via INTRAVENOUS

## 2022-04-15 MED ORDER — PROPOFOL 10 MG/ML IV BOLUS
INTRAVENOUS | Status: AC | PRN
  Administered 2022-04-15: 30 mg via INTRAVENOUS
  Administered 2022-04-15: 10 mg via INTRAVENOUS

## 2022-04-15 MED ORDER — FENTANYL CITRATE PF 50 MCG/ML IJ SOSY
50.0000 ug | PREFILLED_SYRINGE | Freq: Once | INTRAMUSCULAR | Status: AC
  Administered 2022-04-15: 50 ug via INTRAVENOUS
  Filled 2022-04-15: qty 1

## 2022-04-15 MED ORDER — OXYCODONE-ACETAMINOPHEN 5-325 MG PO TABS: 1.0000 | ORAL_TABLET | Freq: Four times a day (QID) | ORAL | 0 refills | Status: DC | PRN

## 2022-04-15 NOTE — Consult Note (Signed)
Reason for Consult:Right ankle fx Referring Physician: Tretha Sciara Time called: 1410 Time at bedside: Miranda [name] is an 56 y.o. female.  HPI: [name] was walking in the woods on her way to fish when she stepped in a hold and twisted her ankle. She had immediate pain and could not bear weight. She was brought to the ED where x-rays showed a right ankle fx and orthopedic surgery was consulted. She lives alone but will have a friend stay with her for a while while she recovers. She is unemployed.  Past Medical History:  Diagnosis Date   Anemia    Anxiety    At risk for sudden cardiac death 2012/11/27   CHF - Combined Systolic + Diastolic Dysfunction. EF of 15-20% with Grade II diastolic dysfunction on echo 10/27/12 10/28/2012   Coronary artery disease    Depression    Dyspnea    Fibroids    Heart murmur    Hypertension    Hypothyroidism    S/P cardiac catheterization, November 27, 2012, normal coronaries with minimal luminal irregularities in RCA system 10/30/2012   Thyroid disease    hypothyroidism    Past Surgical History:  Procedure Laterality Date   AORTIC VALVE REPLACEMENT N/A 03/12/2017   Procedure: AORTIC VALVE REPLACEMENT (AVR) with Mitral Annuloplasty;  Surgeon: Gaye Pollack, MD;  Location: [place];  Service: Open Heart Surgery;  Laterality: N/A;   LEFT AND RIGHT HEART CATHETERIZATION WITH CORONARY ANGIOGRAM N/A November 27, 2012   Procedure: LEFT AND RIGHT HEART CATHETERIZATION WITH CORONARY ANGIOGRAM;  Surgeon: Leonie Man, MD;  Location: [facility] CATH LAB;  Service: Cardiovascular;  Laterality: N/A;   MITRAL VALVE REPAIR N/A 03/12/2017   Procedure: MITRAL VALVE REPAIR (MVR);  Surgeon: Gaye Pollack, MD;  Location: [place];  Service: Open Heart Surgery;  Laterality: N/A;  mitral annuloplasty   MULTIPLE EXTRACTIONS WITH ALVEOLOPLASTY N/A 01/21/2017   Procedure: Extraction of tooth #'s 4-13 with alveoloplasty;  Surgeon: Lenn Cal, DDS;  Location: [place];  Service: Oral Surgery;   Laterality: N/A;   RIGHT/LEFT HEART CATH AND CORONARY ANGIOGRAPHY N/A 01/09/2017   Procedure: Right/Left Heart Cath and Coronary Angiography;  Surgeon: Troy Sine, MD;  Location: [place] CV LAB;  Service: Cardiovascular;  Laterality: N/A;   TEE WITHOUT CARDIOVERSION N/A 03/12/2017   Procedure: TRANSESOPHAGEAL ECHOCARDIOGRAM (TEE);  Surgeon: Gaye Pollack, MD;  Location: [place];  Service: Open Heart Surgery;  Laterality: N/A;    Family History  Problem Relation Age of Onset   Stroke Mother    Hypertension Mother    Liver disease Mother        autoimmune   Heart disease Mother    Colon polyps Mother    Stroke Father    Hypertension Father    Hypertension Maternal Grandmother    Heart disease Maternal Grandmother    Bone cancer Paternal Grandfather    Bone cancer Maternal Uncle    Arthritis Paternal Uncle    Breast cancer Cousin     Social History:  reports that she quit smoking about 43 years ago. Her smoking use included cigars and cigarettes. She has a 2.50 pack-year smoking history. She has never used smokeless tobacco. She reports current alcohol use of about 3.0 standard drinks of alcohol per week. She reports that she does not currently use drugs after having used the following drugs: Marijuana. Frequency: 5.00 times per week.  Allergies:  Allergies  Allergen Reactions   Codeine Hives    Medications: I  have reviewed the patient's current medications.  No results found for this or any previous visit (from the past 48 hour(s)).  No results found.  Review of Systems  HENT:  Negative for ear discharge, ear pain, hearing loss and tinnitus.   Eyes:  Negative for photophobia and pain.  Respiratory:  Negative for cough and shortness of breath.   Cardiovascular:  Negative for chest pain.  Gastrointestinal:  Negative for abdominal pain, nausea and vomiting.  Genitourinary:  Negative for dysuria, flank pain, frequency and urgency.  Musculoskeletal:  Positive for  arthralgias (Right ankle). Negative for back pain, myalgias and neck pain.  Neurological:  Negative for dizziness and headaches.  Hematological:  Does not bruise/bleed easily.  Psychiatric/Behavioral:  The patient is not nervous/anxious.    Blood pressure (!) 177/98, pulse 79, temperature 98.5 F (36.9 C), temperature source Oral, resp. rate 14, last menstrual period 01/17/2015, SpO2 100 %. Physical Exam Constitutional:      General: She is not in acute distress.    Appearance: She is well-developed. She is not diaphoretic.  HENT:     Head: Normocephalic and atraumatic.  Eyes:     General: No scleral icterus.       Right eye: No discharge.        Left eye: No discharge.     Conjunctiva/sclera: Conjunctivae normal.  Cardiovascular:     Rate and Rhythm: Normal rate and regular rhythm.  Pulmonary:     Effort: Pulmonary effort is normal. No respiratory distress.  Musculoskeletal:     Cervical back: Normal range of motion.     Comments: RLE No traumatic wounds or rash, medial ankle ecchymotic  Mod TTP  No knee effusion  Knee stable to varus/ valgus and anterior/posterior stress  Sens DPN, SPN, TN intact  Motor EHL 5/5  DP 2+, No significant edema  Skin:    General: Skin is warm and dry.  Neurological:     Mental Status: She is alert.  Psychiatric:        Mood and Affect: Mood normal.        Behavior: Behavior normal.    Assessment/Plan: Right ankle fx -- Plan CR via CS. Home with NWB RLE and crutches. F/u with Dr. Kathaleen Bury in office this week to discuss surgery.    Lisette Abu, PA-C Orthopedic Surgery [phone] 04/15/2022, 2:19 PM

## 2022-04-15 NOTE — ED Provider Notes (Signed)
[facility] EMERGENCY DEPARTMENT Provider Note   CSN: [id number] Arrival date & time: 04/15/22  1208     History  Chief Complaint  Patient presents with   Ankle Pain    Miranda [name] is a 56 y.o. female.  Patient with history of CAD, CHF, HTN, hyperlipidemia presents today with complaints of right ankle pain. She states that earlier today when she was fishing she stepped in a hole and rolled her right ankle. She endorses pain to same that has been persistent since. She has been unable to walk since the incident. She denies any other injuries. She did not hit her head or loose consciousness. She is not anticoagulated.   The history is provided by the patient. No language interpreter was used.  Ankle Pain      Home Medications Prior to Admission medications   Medication Sig Start Date End Date Taking? Authorizing Provider  acetaminophen (TYLENOL) 500 MG tablet Take 1,000 mg by mouth every 6 (six) hours as needed for mild pain.   Yes [provider]  aspirin EC 81 MG EC tablet Take 1 tablet (81 mg total) by mouth daily. 11/01/12  Yes Isaiah Serge, NP  carvedilol (COREG) 6.25 MG tablet Take 1 tablet (6.25 mg total) by mouth 2 (two) times daily. 09/03/21  Yes Jerline Pain, MD  Cholecalciferol (D3) 50 MCG (2000 UT) TABS Take 2,000 Units by mouth daily.   Yes [provider]  ferrous sulfate 325 (65 FE) MG EC tablet Take 1 tablet (325 mg total) by mouth 2 (two) times daily. 05/24/18  Yes Reed, Tiffany L, DO  fluticasone (FLONASE) 50 MCG/ACT nasal spray USE 1 SPRAY(S) IN EACH NOSTRIL ONCE DAILY AS NEEDED FOR ALLERGIES OR RHINITIS Patient taking differently: Place 1 spray into both nostrils daily as needed for allergies or rhinitis. 01/05/20  Yes Reed, Tiffany L, DO  furosemide (LASIX) 40 MG tablet Take 1 tablet by mouth once daily Patient taking differently: Take 40 mg by mouth daily. 09/03/21  Yes Jerline Pain, MD  levothyroxine (SYNTHROID) 100  MCG tablet TAKE 1 TABLET BY MOUTH ONCE DAILY BEFORE BREAKFAST Patient taking differently: Take 100 mcg by mouth daily before breakfast. 02/10/22  Yes Eubanks, Carlos American, NP  lisinopril (ZESTRIL) 5 MG tablet TAKE 1 TABLET BY MOUTH AT BEDTIME Patient taking differently: Take 5 mg by mouth at bedtime. 12/09/21  Yes Jerline Pain, MD  mirtazapine (REMERON) 15 MG tablet TAKE 1 TABLET BY MOUTH ONCE DAILY AT BEDTIME Patient taking differently: Take 15 mg by mouth at bedtime. 01/06/22  Yes Lauree Chandler, NP  rosuvastatin (CRESTOR) 40 MG tablet Take 1 tablet (40 mg total) by mouth daily. 05/02/21  Yes Lauree Chandler, NP  traZODone (DESYREL) 150 MG tablet TAKE 1 TABLET BY MOUTH ONCE DAILY AT BEDTIME Patient taking differently: Take 150 mg by mouth at bedtime. 01/06/22  Yes Lauree Chandler, NP      Allergies    Codeine    Review of Systems   Review of Systems  Musculoskeletal:  Positive for arthralgias.  All other systems reviewed and are negative.   Physical Exam Updated Vital Signs BP (!) 177/98 (BP Location: Right Arm)   Pulse 79   Temp 98.5 F (36.9 C) (Oral)   Resp 14   Ht '5\' 5"'$  (1.651 m)   Wt 64.4 kg   LMP 01/17/2015   SpO2 100%   BMI 23.63 kg/m  Physical Exam Vitals and nursing note reviewed.  Constitutional:      General: She is not in acute distress.    Appearance: Normal appearance. She is normal weight. She is not ill-appearing, toxic-appearing or diaphoretic.  HENT:     Head: Normocephalic and atraumatic.  Cardiovascular:     Rate and Rhythm: Normal rate.  Pulmonary:     Effort: Pulmonary effort is normal. No respiratory distress.  Musculoskeletal:        General: Normal range of motion.     Cervical back: Normal range of motion.     Comments: Bilateral tenderness noted to the right ankle with obvious deformity. Associated bruising to same. DP and PT pulses intact and 2+. Capillary refill less than 2 seconds.  Skin:    General: Skin is warm and dry.   Neurological:     General: No focal deficit present.     Mental Status: She is alert.  Psychiatric:        Mood and Affect: Mood normal.        Behavior: Behavior normal.     ED Results / Procedures / Treatments   Labs (all labs ordered are listed, but only abnormal results are displayed) Labs Reviewed - No data to display  EKG None  Radiology DG Ankle Complete Right  Result Date: 04/15/2022 CLINICAL DATA:  Postreduction EXAM: RIGHT ANKLE - COMPLETE 3+ VIEW COMPARISON:  Radiograph performed earlier on the same date. FINDINGS: Status post casting and reduction demonstrate improved alignment of the trimalleolar fracture. Overlying cast obscures fine osseous details. IMPRESSION: Status post reduction and casting radiographs demonstrate improved alignment of the trimalleolar right ankle fracture. Electronically Signed   By: Keane Police D.O.   On: 04/15/2022 16:44   DG Ankle Complete Right  Result Date: 04/15/2022 CLINICAL DATA:  Fall. EXAM: RIGHT ANKLE - COMPLETE 3+ VIEW COMPARISON:  None Available. FINDINGS: Fracture dislocation of the ankle. Displaced fracture distal fibula and medial malleolus. Fracture posterior malleolus. Talus displaced posteriorly. Diffuse soft tissue swelling. IMPRESSION: Trimalleolar fracture dislocation of the ankle. Electronically Signed   By: Franchot Gallo M.D.   On: 04/15/2022 13:46    Procedures Procedures    Medications Ordered in ED Medications  propofol (DIPRIVAN) 10 mg/mL bolus/IV push 32.2 mg (has no administration in time range)  lactated ringers bolus 1,000 mL (1,000 mLs Intravenous New Bag/Given 04/15/22 1446)  ondansetron (ZOFRAN) injection 4 mg (4 mg Intravenous Given 04/15/22 1447)  fentaNYL (SUBLIMAZE) injection 50 mcg (50 mcg Intravenous Given 04/15/22 1448)    ED Course/ Medical Decision Making/ A&P                           Medical Decision Making Amount and/or Complexity of Data Reviewed Radiology: ordered.  Risk Prescription  drug management.   Patient presents today with complaints of right ankle pain after fishing earlier today. She is afebrile, non-toxic appearing, and in no acute distress with reassuring vital signs. Physical exam reveals right ankle deformity with good pulses and cap refill. X-ray imaging reveals trimalleolar fracture dislocation of the right ankle. I have personally reviewed and interpreted this imaging and agree with radiology interpretation.   Orthopedic surgery PA Hilbert Odor consulted for recommendations who will see the patient. Plan for conscious sedation and reduction in the ED.   Fracture successfully reduced in the ER, x-ray post reduction reveals improved alignment. I have personally reviewed and interpreted this imaging and agree with radiology interpretation.   Patient is stable for discharge, given ortho  referral outpatient referral to orthopedics for continued evaluation and management. Patient also given prescription for percocet for pain control. I have reviewed PDMP and deemed patient adequate candidate for narcotics. Return precautions given. Patient understanding and amenable with plan, discharged in stable condition.     Final Clinical Impression(s) / ED Diagnoses Final diagnoses:  Closed fracture of right ankle, initial encounter    Rx / DC Orders ED Discharge Orders          Ordered    oxyCODONE-acetaminophen (PERCOCET/ROXICET) 5-325 MG tablet  Every 6 hours PRN        04/15/22 1624          An After Visit Summary was printed and given to the patient.     Nestor Lewandowsky 04/16/22 Darryll Capers, MD 04/21/22 1459

## 2022-04-15 NOTE — Discharge Instructions (Signed)
Please use your crutches at all times and do not bear weight on your right ankle. Keep your splint on at all times until you are able to see ortho outpatient.  I have given you a referral with a number to call to schedule an appointment for continued evaluation and management of your fracture.  I have also given you a prescription for narcotic pain medication for you to take as prescribed as needed for severe pain only.  Do not drive or operate heavy machinery after taking this medication.  Return if development of any new or worsening symptoms.

## 2022-04-15 NOTE — ED Triage Notes (Signed)
Patient complains of right ankle pain that started today at approximately 1000 after tripping in the woods while fishing. Patient denies any other pain, denies LOC. Patient is alert, oriented, and in no apparent distress at this time.

## 2022-04-15 NOTE — Procedures (Signed)
Procedure: Right ankle closed reduction   Indication: Right ankle fracture   Surgeon: Silvestre Gunner, PA-C   Assist: None   Anesthesia: Propofol via EDP   EBL: None   Complications: None   Findings: After risks/benefits explained patient desires to undergo procedure. Consent obtained and time out performed. Sedation given.  Ankle reduced and splinted without difficulty. Pt tolerated the procedure well. X-rays pending.       Lisette Abu, PA-C Orthopedic Surgery [phone]

## 2022-04-15 NOTE — ED Provider Triage Note (Signed)
Emergency Medicine Provider Triage Evaluation Note  Miranda [name] , a 55 y.o. female  was evaluated in triage.  Pt complains of right ankle pain.  Patient was going to go fishing, unsure if she rolled her ankle or stepped in a hole but reports isolated injury to her right ankle.  Did not hit head, no loss of consciousness, no upper extremity injury..  Patient was not able to bear weight on the leg after the injury.  N.p.o. since last night.  Review of Systems  Positive: Right ankle pain and swelling Negative: Head pain, neck pain, upper extremity injury  Physical Exam  BP (!) 156/108 (BP Location: Left Arm)   Pulse 81   Temp 98 F (36.7 C) (Oral)   Resp 18   LMP 01/17/2015   SpO2 100%  Gen:   Awake, no distress   Resp:  Normal effort  MSK:   Swelling to right ankle, pain more so laterally in the ankle.  DP pulse present, sensation intact.  No pain in the foot, no pain in the knee or proximal fibula. Other:    Medical Decision Making  Medically screening exam initiated at 1:21 PM.  Appropriate orders placed.  Donnelle Hlavac was informed that the remainder of the evaluation will be completed by another provider, this initial triage assessment does not replace that evaluation, and the importance of remaining in the ED until their evaluation is complete.     Tacy Learn, PA-C 04/15/22 1324

## 2022-04-15 NOTE — ED Provider Notes (Signed)
[facility] EMERGENCY DEPARTMENT Provider Note   CSN: [id number] Arrival date & time: 04/15/22  1208     History  Chief Complaint  Patient presents with   Ankle Pain    Miranda [name] is a 56 y.o. female.  HPI     Home Medications Prior to Admission medications   Medication Sig Start Date End Date Taking? Authorizing Provider  acetaminophen (TYLENOL) 500 MG tablet Take 1,000 mg by mouth every 6 (six) hours as needed for mild pain.   Yes [provider]  aspirin EC 81 MG EC tablet Take 1 tablet (81 mg total) by mouth daily. 11/01/12  Yes Isaiah Serge, NP  carvedilol (COREG) 6.25 MG tablet Take 1 tablet (6.25 mg total) by mouth 2 (two) times daily. 09/03/21  Yes Jerline Pain, MD  Cholecalciferol (D3) 50 MCG (2000 UT) TABS Take 2,000 Units by mouth daily.   Yes [provider]  ferrous sulfate 325 (65 FE) MG EC tablet Take 1 tablet (325 mg total) by mouth 2 (two) times daily. 05/24/18  Yes Reed, Tiffany L, DO  fluticasone (FLONASE) 50 MCG/ACT nasal spray USE 1 SPRAY(S) IN EACH NOSTRIL ONCE DAILY AS NEEDED FOR ALLERGIES OR RHINITIS Patient taking differently: Place 1 spray into both nostrils daily as needed for allergies or rhinitis. 01/05/20  Yes Reed, Tiffany L, DO  furosemide (LASIX) 40 MG tablet Take 1 tablet by mouth once daily Patient taking differently: Take 40 mg by mouth daily. 09/03/21  Yes Jerline Pain, MD  levothyroxine (SYNTHROID) 100 MCG tablet TAKE 1 TABLET BY MOUTH ONCE DAILY BEFORE BREAKFAST Patient taking differently: Take 100 mcg by mouth daily before breakfast. 02/10/22  Yes Eubanks, Carlos American, NP  lisinopril (ZESTRIL) 5 MG tablet TAKE 1 TABLET BY MOUTH AT BEDTIME Patient taking differently: Take 5 mg by mouth at bedtime. 12/09/21  Yes Jerline Pain, MD  mirtazapine (REMERON) 15 MG tablet TAKE 1 TABLET BY MOUTH ONCE DAILY AT BEDTIME Patient taking differently: Take 15 mg by mouth at bedtime. 01/06/22  Yes Lauree Chandler, NP   rosuvastatin (CRESTOR) 40 MG tablet Take 1 tablet (40 mg total) by mouth daily. 05/02/21  Yes Lauree Chandler, NP  traZODone (DESYREL) 150 MG tablet TAKE 1 TABLET BY MOUTH ONCE DAILY AT BEDTIME Patient taking differently: Take 150 mg by mouth at bedtime. 01/06/22  Yes Lauree Chandler, NP      Allergies    Codeine    Review of Systems   Review of Systems  Physical Exam Updated Vital Signs BP (!) 158/118   Pulse 75   Temp 98.5 F (36.9 C) (Oral)   Resp 19   Ht '5\' 5"'$  (1.651 m)   Wt 64.4 kg   LMP 01/17/2015   SpO2 100%   BMI 23.63 kg/m  Physical Exam  ED Results / Procedures / Treatments   Labs (all labs ordered are listed, but only abnormal results are displayed) Labs Reviewed - No data to display  EKG None  Radiology No results found.  Procedures .Sedation  Date/Time: 04/15/2022 4:01 PM  Performed by: Tretha Sciara, MD Authorized by: Tretha Sciara, MD   Universal protocol:    Procedure explained and questions answered to patient or proxy's satisfaction: yes     Relevant documents present and verified: yes     Immediately prior to procedure, a time out was called: yes     Patient identity confirmed:  Verbally with patient Pre-sedation assessment:  Time since last food or drink:  6 hours   ASA classification: class 1 - normal, healthy patient     Mallampati score:  I - soft palate, uvula, fauces, pillars visible   Pre-sedation assessments completed and reviewed: airway patency, cardiovascular function and hydration status   Immediate pre-procedure details:    Reviewed: vital signs     Verified: bag valve mask available   Procedure details (see MAR for exact dosages):    Total Provider sedation time (minutes):  25 Post-procedure details:    Post-sedation assessment completed:  04/15/2022 4:01 PM   Attendance: Constant attendance by certified staff until patient recovered     Post-sedation assessments completed and reviewed: airway patency,  cardiovascular function and hydration status     Patient is stable for discharge or admission: yes     Procedure completion:  Tolerated well, no immediate complications     Medications Ordered in ED Medications  propofol (DIPRIVAN) 10 mg/mL bolus/IV push 32.2 mg (has no administration in time range)  lactated ringers bolus 1,000 mL (1,000 mLs Intravenous New Bag/Given 04/15/22 1446)  ondansetron (ZOFRAN) injection 4 mg (4 mg Intravenous Given 04/15/22 1447)  fentaNYL (SUBLIMAZE) injection 50 mcg (50 mcg Intravenous Given 04/15/22 1448)  propofol (DIPRIVAN) 10 mg/mL bolus/IV push (10 mg Intravenous Given 04/15/22 1555)    ED Course/ Medical Decision Making/ A&P                           Medical Decision Making Amount and/or Complexity of Data Reviewed Radiology: ordered.  Risk Prescription drug management.   Stable 56 year old female with an ankle doctor after stepping into a hole while fishing.       Final Clinical Impression(s) / ED Diagnoses Final diagnoses:  None    Rx / DC Orders ED Discharge Orders     None         Tretha Sciara, MD 04/15/22 [phone]

## 2022-04-15 NOTE — ED Notes (Signed)
XR at bedside

## 2022-04-18 ENCOUNTER — Other Ambulatory Visit: Payer: Self-pay | Admitting: Orthopaedic Surgery

## 2022-04-18 ENCOUNTER — Ambulatory Visit
Admission: RE | Admit: 2022-04-18 | Discharge: 2022-04-18 | Disposition: A | Discharge status: Home / Self Care | Payer: HMO | Source: Ambulatory Visit | Attending: Orthopaedic Surgery | Admitting: Orthopaedic Surgery

## 2022-04-18 ENCOUNTER — Telehealth: Payer: Self-pay | Admitting: *Deleted

## 2022-04-18 DIAGNOSIS — S82851A Displaced trimalleolar fracture of right lower leg, initial encounter for closed fracture: Secondary | ICD-10-CM | POA: Diagnosis not present

## 2022-04-18 DIAGNOSIS — M25571 Pain in right ankle and joints of right foot: Secondary | ICD-10-CM

## 2022-04-18 DIAGNOSIS — M7989 Other specified soft tissue disorders: Secondary | ICD-10-CM | POA: Diagnosis not present

## 2022-04-18 NOTE — Telephone Encounter (Signed)
Received Surgery Clearance form from [facility], Dr. Armond Hang requesting Surgery Clearance for Right Ankle ORIF, possible syndesmosis and/or deltoid fixation, possible external fixation on 04/24/2022.  Called patient and scheduled a Surgery Clearance Appointment for 04/21/2022  Placed paperwork in [place] under "18"  To be faxed back to Vernie Ammons with [facility] Fax: [phone]

## 2022-04-18 NOTE — Telephone Encounter (Signed)
Pt agreeable to plan of care for tele pre op appt 04/21/22 @ 3:20 add on due to surgery date. Pt tells me that the surgeon office instructed her to tale ASA 81 mg BID perio-op period. I did d/w pre op provider today who had noted about holding ASA x 7 days. Pre op provider agreeable to ok for pt to take ASA as instructed by surgeon office. Pt thanked me for the call and the help.  Med rec and consent are done.

## 2022-04-18 NOTE — Telephone Encounter (Signed)
Primary Cardiologist:Mark Marlou Porch, MD   Preoperative team, please contact this patient and set up a phone call appointment for further preoperative risk assessment. Please obtain consent and complete medication review. Thank you for your help.   I confirm that guidance regarding antiplatelet and oral anticoagulation therapy has been completed and, if necessary, noted below (none requested). If patient has been advised to hold aspirin, it would be reasonable to hold for 7 days prior to surgery pending no symptoms of ACS.    Emmaline Life, NP-C     04/18/2022, 9:52 AM [address], [address] Office [phone] Fax [phone]

## 2022-04-18 NOTE — Telephone Encounter (Signed)
   Pre-operative Risk Assessment    Patient Name: Miranda [name]  DOB: 05-27-1966 MRN: [id number]      Request for Surgical Clearance    Procedure:   RIGHT ANKLE ORIF, POSSIBLE SYNDESMOSIS AND /OR DELTOID FIXATION, POSSIBLE EXTERNAL FIXATION  Date of Surgery:  Clearance 04/24/22                                 Surgeon:  DR. Armond Hang Surgeon's Group or Practice Name:  [facility] Phone number:  [phone] Fax number:  [phone] ATTN:E MEGAN DAVIS   Type of Clearance Requested:   - Medical ; NO MEDICATIONS LISTED AS NEEDING TO BE HELD   Type of Anesthesia:   CHOICE PER SURGEON OFFICE    Additional requests/questions:    Jiles Prows   04/18/2022, 9:21 AM

## 2022-04-18 NOTE — Telephone Encounter (Signed)
Pt agreeable to plan of care for tele pre op appt 04/21/22 @ 3:20 add on due to surgery date. Pt tells me that the surgeon office instructed her to tale ASA 81 mg BID perio-op period. I did d/w pre op provider today who had noted about holding ASA x 7 days. Pre op provider agreeable to ok for pt to take ASA as instructed by surgeon office. Pt thanked me for the call and the help.  Med rec and consent are done.      Patient Consent for Virtual Visit        Miranda [name] has provided verbal consent on 04/18/2022 for a virtual visit (video or telephone).   CONSENT FOR VIRTUAL VISIT FOR:  Miranda [name]  By participating in this virtual visit I agree to the following:  I hereby voluntarily request, consent and authorize [place] and its employed or contracted physicians, physician assistants, nurse practitioners or other licensed health care professionals (the Practitioner), to provide me with telemedicine health care services (the "Services") as deemed necessary by the treating Practitioner. I acknowledge and consent to receive the Services by the Practitioner via telemedicine. I understand that the telemedicine visit will involve communicating with the Practitioner through live audiovisual communication technology and the disclosure of certain medical information by electronic transmission. I acknowledge that I have been given the opportunity to request an in-person assessment or other available alternative prior to the telemedicine visit and am voluntarily participating in the telemedicine visit.  I understand that I have the right to withhold or withdraw my consent to the use of telemedicine in the course of my care at any time, without affecting my right to future care or treatment, and that the Practitioner or I may terminate the telemedicine visit at any time. I understand that I have the right to inspect all information obtained and/or recorded in the course of the telemedicine  visit and may receive copies of available information for a reasonable fee.  I understand that some of the potential risks of receiving the Services via telemedicine include:  Delay or interruption in medical evaluation due to technological equipment failure or disruption; Information transmitted may not be sufficient (e.g. poor resolution of images) to allow for appropriate medical decision making by the Practitioner; and/or  In rare instances, security protocols could fail, causing a breach of personal health information.  Furthermore, I acknowledge that it is my responsibility to provide information about my medical history, conditions and care that is complete and accurate to the best of my ability. I acknowledge that Practitioner's advice, recommendations, and/or decision may be based on factors not within their control, such as incomplete or inaccurate data provided by me or distortions of diagnostic images or specimens that may result from electronic transmissions. I understand that the practice of medicine is not an exact science and that Practitioner makes no warranties or guarantees regarding treatment outcomes. I acknowledge that a copy of this consent can be made available to me via my patient portal ([place]), or I can request a printed copy by calling the office of [place].    I understand that my insurance will be billed for this visit.   I have read or had this consent read to me. I understand the contents of this consent, which adequately explains the benefits and risks of the Services being provided via telemedicine.  I have been provided ample opportunity to ask questions regarding this consent and the Services and have  had my questions answered to my satisfaction. I give my informed consent for the services to be provided through the use of telemedicine in my medical care

## 2022-04-21 ENCOUNTER — Encounter: Payer: Self-pay | Admitting: Nurse Practitioner

## 2022-04-21 ENCOUNTER — Ambulatory Visit (INDEPENDENT_AMBULATORY_CARE_PROVIDER_SITE_OTHER): Payer: HMO | Admitting: Nurse Practitioner

## 2022-04-21 ENCOUNTER — Ambulatory Visit: Payer: HMO | Attending: Interventional Cardiology | Admitting: Physician Assistant

## 2022-04-21 VITALS — BP 136/88 | HR 88 | Temp 98.2°F

## 2022-04-21 DIAGNOSIS — I5022 Chronic systolic (congestive) heart failure: Secondary | ICD-10-CM

## 2022-04-21 DIAGNOSIS — Z01810 Encounter for preprocedural cardiovascular examination: Secondary | ICD-10-CM | POA: Diagnosis not present

## 2022-04-21 DIAGNOSIS — R61 Generalized hyperhidrosis: Secondary | ICD-10-CM

## 2022-04-21 DIAGNOSIS — Z01818 Encounter for other preprocedural examination: Secondary | ICD-10-CM | POA: Diagnosis not present

## 2022-04-21 DIAGNOSIS — I428 Other cardiomyopathies: Secondary | ICD-10-CM | POA: Diagnosis not present

## 2022-04-21 DIAGNOSIS — E039 Hypothyroidism, unspecified: Secondary | ICD-10-CM | POA: Diagnosis not present

## 2022-04-21 DIAGNOSIS — Z23 Encounter for immunization: Secondary | ICD-10-CM

## 2022-04-21 LAB — COMPLETE METABOLIC PANEL WITH GFR
AG Ratio: 0.9 (calc) — ABNORMAL LOW (ref 1.0–2.5)
ALT: 8 U/L (ref 6–29)
AST: 21 U/L (ref 10–35)
Albumin: 4.3 g/dL (ref 3.6–5.1)
Alkaline phosphatase (APISO): 68 U/L (ref 37–153)
BUN: 17 mg/dL (ref 7–25)
CO2: 26 mmol/L (ref 20–32)
Calcium: 10.1 mg/dL (ref 8.6–10.4)
Chloride: 102 mmol/L (ref 98–110)
Creat: 0.98 mg/dL (ref 0.50–1.03)
Globulin: 4.7 g/dL (calc) — ABNORMAL HIGH (ref 1.9–3.7)
Glucose, Bld: 84 mg/dL (ref 65–99)
Potassium: 4.7 mmol/L (ref 3.5–5.3)
Sodium: 137 mmol/L (ref 135–146)
Total Bilirubin: 0.5 mg/dL (ref 0.2–1.2)
Total Protein: 9 g/dL — ABNORMAL HIGH (ref 6.1–8.1)
eGFR: 67 mL/min/{1.73_m2} (ref >=60)

## 2022-04-21 LAB — CBC WITH DIFFERENTIAL/PLATELET
Absolute Monocytes: 608 cells/uL (ref 200–950)
Basophils Absolute: 19 cells/uL (ref 0–200)
Basophils Relative: 0.3 %
Eosinophils Absolute: 81 cells/uL (ref 15–500)
Eosinophils Relative: 1.3 %
HCT: 37.2 % (ref 35.0–45.0)
Hemoglobin: 12 g/dL (ref 11.7–15.5)
Lymphs Abs: 1525 cells/uL (ref 850–3900)
MCH: 28 pg (ref 27.0–33.0)
MCHC: 32.3 g/dL (ref 32.0–36.0)
MCV: 86.9 fL (ref 80.0–100.0)
MPV: 12.4 fL (ref 7.5–12.5)
Monocytes Relative: 9.8 %
Neutro Abs: 3968 cells/uL (ref 1500–7800)
Neutrophils Relative %: 64 %
Platelets: 186 10*3/uL (ref 140–400)
RBC: 4.28 10*6/uL (ref 3.80–5.10)
RDW: 13.7 % (ref 11.0–15.0)
Total Lymphocyte: 24.6 %
WBC: 6.2 10*3/uL (ref 3.8–10.8)

## 2022-04-21 NOTE — Progress Notes (Addendum)
Virtual Visit via Telephone Note   Because of [name]'s co-morbid illnesses, she is at least at moderate risk for complications without adequate follow up.  This format is felt to be most appropriate for this patient at this time.  The patient did not have access to video technology/had technical difficulties with video requiring transitioning to audio format only (telephone).  All issues noted in this document were discussed and addressed.  No physical exam could be performed with this format.  Please refer to the patient's chart for her consent to telehealth for [facility].  Evaluation Performed:  Preoperative cardiovascular risk assessment _____________   Date:  04/21/2022   Patient ID:  Miranda [name], DOB Apr 06, 1966, MRN [id number] Patient Location:  Home Provider location:   Office  Primary Care Provider:  Lauree Chandler, NP Primary Cardiologist:  Candee Furbish, MD  Chief Complaint / Patient Profile   56 y.o. y/o female with a h/o CHF, CAD, Anxiety, hypertension, hypothyroidism who is pending ankle surgery and presents today for telephonic preoperative cardiovascular risk assessment.  Past Medical History    Past Medical History:  Diagnosis Date   Anemia    Anxiety    At risk for sudden cardiac death 2012-11-22   CHF - Combined Systolic + Diastolic Dysfunction. EF of 15-20% with Grade II diastolic dysfunction on echo 10/27/12 10/28/2012   Coronary artery disease    Depression    Dyspnea    Fibroids    Heart murmur    Hypertension    Hypothyroidism    S/P cardiac catheterization, 11-22-2012, normal coronaries with minimal luminal irregularities in RCA system 10/30/2012   Thyroid disease    hypothyroidism   Past Surgical History:  Procedure Laterality Date   AORTIC VALVE REPLACEMENT N/A 03/12/2017   Procedure: AORTIC VALVE REPLACEMENT (AVR) with Mitral Annuloplasty;  Surgeon: Gaye Pollack, MD;  Location: [place];  Service: Open Heart Surgery;   Laterality: N/A;   LEFT AND RIGHT HEART CATHETERIZATION WITH CORONARY ANGIOGRAM N/A 2012/11/22   Procedure: LEFT AND RIGHT HEART CATHETERIZATION WITH CORONARY ANGIOGRAM;  Surgeon: Leonie Man, MD;  Location: [facility] CATH LAB;  Service: Cardiovascular;  Laterality: N/A;   MITRAL VALVE REPAIR N/A 03/12/2017   Procedure: MITRAL VALVE REPAIR (MVR);  Surgeon: Gaye Pollack, MD;  Location: [place];  Service: Open Heart Surgery;  Laterality: N/A;  mitral annuloplasty   MULTIPLE EXTRACTIONS WITH ALVEOLOPLASTY N/A 01/21/2017   Procedure: Extraction of tooth #'s 4-13 with alveoloplasty;  Surgeon: Lenn Cal, DDS;  Location: [place];  Service: Oral Surgery;  Laterality: N/A;   RIGHT/LEFT HEART CATH AND CORONARY ANGIOGRAPHY N/A 01/09/2017   Procedure: Right/Left Heart Cath and Coronary Angiography;  Surgeon: Troy Sine, MD;  Location: [place] CV LAB;  Service: Cardiovascular;  Laterality: N/A;   TEE WITHOUT CARDIOVERSION N/A 03/12/2017   Procedure: TRANSESOPHAGEAL ECHOCARDIOGRAM (TEE);  Surgeon: Gaye Pollack, MD;  Location: [place];  Service: Open Heart Surgery;  Laterality: N/A;    Allergies  Allergies  Allergen Reactions   Codeine Hives    History of Present Illness    Miranda [name] is a 56 y.o. female who presents via audio/video conferencing for a telehealth visit today.  Pt was last seen in cardiology clinic on 08/09/21 by Dr. Marlou Porch.  At that time Miranda [name] was doing well .  The patient is now pending procedure as outlined above. Since her last visit, she has been doing okay from a cardiac standpoint.  She  has not had any chest pain, shortness of breath, or palpitations.  She states her activity is mostly limited by her ankle.  [facility] [employer] and was going 3 days a week but then she twisted her ankle while fishing and has not been doing much activity since then.  Since she does not meet minimum METS requirement and is scored a 3.3 METS today on the DASI I will have to forward on  this clearance to Dr. Marlou Porch for final decision.  Her surgery is 9/21.    She is actually taking 81 mg in the morning and night.  Her surgeon wants her to continue aspirin.  Home Medications    Prior to Admission medications   Medication Sig Start Date End Date Taking? Authorizing Provider  acetaminophen (TYLENOL) 500 MG tablet Take 1,000 mg by mouth every 6 (six) hours as needed for mild pain.    [provider]  aspirin EC 81 MG EC tablet Take 1 tablet (81 mg total) by mouth daily. 11/01/12   Isaiah Serge, NP  carvedilol (COREG) 6.25 MG tablet Take 1 tablet (6.25 mg total) by mouth 2 (two) times daily. 09/03/21   Jerline Pain, MD  Cholecalciferol (D3) 50 MCG (2000 UT) TABS Take 2,000 Units by mouth daily.    [provider]  ferrous sulfate 325 (65 FE) MG EC tablet Take 1 tablet (325 mg total) by mouth 2 (two) times daily. 05/24/18   Reed, Tiffany L, DO  fluticasone (FLONASE) 50 MCG/ACT nasal spray USE 1 SPRAY(S) IN EACH NOSTRIL ONCE DAILY AS NEEDED FOR ALLERGIES OR RHINITIS Patient taking differently: Place 1 spray into both nostrils daily as needed for allergies or rhinitis. 01/05/20   Reed, Tiffany L, DO  furosemide (LASIX) 40 MG tablet Take 1 tablet by mouth once daily Patient taking differently: Take 40 mg by mouth daily. 09/03/21   Jerline Pain, MD  levothyroxine (SYNTHROID) 100 MCG tablet TAKE 1 TABLET BY MOUTH ONCE DAILY BEFORE BREAKFAST Patient taking differently: Take 100 mcg by mouth daily before breakfast. 02/10/22   Lauree Chandler, NP  lisinopril (ZESTRIL) 5 MG tablet TAKE 1 TABLET BY MOUTH AT BEDTIME Patient taking differently: Take 5 mg by mouth at bedtime. 12/09/21   Jerline Pain, MD  mirtazapine (REMERON) 15 MG tablet TAKE 1 TABLET BY MOUTH ONCE DAILY AT BEDTIME Patient taking differently: Take 15 mg by mouth at bedtime. 01/06/22   Lauree Chandler, NP  oxyCODONE-acetaminophen (PERCOCET/ROXICET) 5-325 MG tablet Take 1 tablet by mouth every 6 (six)  hours as needed for severe pain. 04/15/22   Smoot, Leary Roca, PA-C  rosuvastatin (CRESTOR) 40 MG tablet Take 1 tablet (40 mg total) by mouth daily. 05/02/21   Lauree Chandler, NP  traZODone (DESYREL) 150 MG tablet TAKE 1 TABLET BY MOUTH ONCE DAILY AT BEDTIME Patient taking differently: Take 150 mg by mouth at bedtime. 01/06/22   Lauree Chandler, NP    Physical Exam    Vital Signs:  Jaquasha Federman does not have vital signs available for review today.  Given telephonic nature of communication, physical exam is limited. AAOx3. NAD. Normal affect.  Speech and respirations are unlabored.  Accessory Clinical Findings    None  Assessment & Plan    1.  Preoperative Cardiovascular Risk Assessment:   Ms. Duguid perioperative risk of a major cardiac event is 0.9% according to the Revised Cardiac Risk Index (RCRI).  Therefore, she is at low risk for perioperative complications.  Her functional capacity is poor at 3.3 METs according to the Duke Activity Status Index (DASI). Recommendations: Prior cardiac catheterization showed minimal nonobstructive coronary artery disease.  This was prior to her mitral valve repair.  Her ejection fraction improved post repair.  She may proceed with ankle surgery with low cardiac risk. -Dr. Marlou Porch   Time:   Today, I have spent 10 minutes with the patient with telehealth technology discussing medical history, symptoms, and management plan.     Elgie Collard, PA-C  04/21/2022, 1:52 PM

## 2022-04-21 NOTE — Progress Notes (Signed)
Careteam: Patient Care Team: Lauree Chandler, NP as PCP - General (Geriatric Medicine) Jerline Pain, MD as PCP - Cardiology (Cardiology) Emily Filbert, MD as Consulting Physician (Obstetrics and Gynecology) Haroldine Laws, Shaune Pascal, MD as Consulting Physician (Cardiology) Leta Baptist, Earlean Polka, MD as Consulting Physician (Neurology)  PLACE OF SERVICE:  [place] Directive information    Allergies  Allergen Reactions   Codeine Hives    Chief Complaint  Patient presents with   Follow-up    Pre-op exam     HPI: Patient is a 56 y.o. female for evaluation prior to surgery.  Reports she was fishing and twisted her ankle while walking and now needing ORIF of the right ankle.  Former smoker- has not smoked since 1980. Pt with hx of CHF< htn, cardiomyopathy, hypothyroid, hyperlipidemia, abnormal SPEP  Denies shortness of breath, no chest pains, no worsening LE edema.  She is having pain 7/10. Better when elevated and being still. Oxycodone helping with pain  Blood pressure slightly elevated today but improved on recheck.  Took blood pressure medication this morning.  She is doing a evaluation with cardiology tomorrow prior to [place]   She has stopped her Wellbutrin and her excessive sweating has continued.   She has stopped vitamins prior to surgery Review of Systems:  Review of Systems  Constitutional:  Negative for chills, fever and weight loss.  HENT:  Negative for tinnitus.   Respiratory:  Negative for cough, sputum production and shortness of breath.   Cardiovascular:  Negative for chest pain, palpitations and leg swelling.  Gastrointestinal:  Negative for abdominal pain, constipation, diarrhea and heartburn.  Genitourinary:  Negative for dysuria, frequency and urgency.  Musculoskeletal:  Positive for falls and joint pain. Negative for back pain and myalgias.  Skin: Negative.   Neurological:  Negative for dizziness and headaches.  Psychiatric/Behavioral:   Negative for depression.     Past Medical History:  Diagnosis Date   Anemia    Anxiety    At risk for sudden cardiac death 2012/11/27   CHF - Combined Systolic + Diastolic Dysfunction. EF of 15-20% with Grade II diastolic dysfunction on echo 10/27/12 10/28/2012   Coronary artery disease    Depression    Dyspnea    Fibroids    Heart murmur    Hypertension    Hypothyroidism    S/P cardiac catheterization, Nov 27, 2012, normal coronaries with minimal luminal irregularities in RCA system 10/30/2012   Thyroid disease    hypothyroidism   Past Surgical History:  Procedure Laterality Date   AORTIC VALVE REPLACEMENT N/A 03/12/2017   Procedure: AORTIC VALVE REPLACEMENT (AVR) with Mitral Annuloplasty;  Surgeon: Gaye Pollack, MD;  Location: [place];  Service: Open Heart Surgery;  Laterality: N/A;   LEFT AND RIGHT HEART CATHETERIZATION WITH CORONARY ANGIOGRAM N/A 2012/11/27   Procedure: LEFT AND RIGHT HEART CATHETERIZATION WITH CORONARY ANGIOGRAM;  Surgeon: Leonie Man, MD;  Location: [facility] CATH LAB;  Service: Cardiovascular;  Laterality: N/A;   MITRAL VALVE REPAIR N/A 03/12/2017   Procedure: MITRAL VALVE REPAIR (MVR);  Surgeon: Gaye Pollack, MD;  Location: [place];  Service: Open Heart Surgery;  Laterality: N/A;  mitral annuloplasty   MULTIPLE EXTRACTIONS WITH ALVEOLOPLASTY N/A 01/21/2017   Procedure: Extraction of tooth #'s 4-13 with alveoloplasty;  Surgeon: Lenn Cal, DDS;  Location: [place];  Service: Oral Surgery;  Laterality: N/A;   RIGHT/LEFT HEART CATH AND CORONARY ANGIOGRAPHY N/A 01/09/2017   Procedure: Right/Left Heart Cath and Coronary  Angiography;  Surgeon: Troy Sine, MD;  Location: [place] CV LAB;  Service: Cardiovascular;  Laterality: N/A;   TEE WITHOUT CARDIOVERSION N/A 03/12/2017   Procedure: TRANSESOPHAGEAL ECHOCARDIOGRAM (TEE);  Surgeon: Gaye Pollack, MD;  Location: [place];  Service: Open Heart Surgery;  Laterality: N/A;   Social History:   reports that she quit smoking  about 43 years ago. Her smoking use included cigars and cigarettes. She has a 2.50 pack-year smoking history. She has never used smokeless tobacco. She reports current alcohol use of about 3.0 standard drinks of alcohol per week. She reports that she does not currently use drugs after having used the following drugs: Marijuana. Frequency: 5.00 times per week.  Family History  Problem Relation Age of Onset   Stroke Mother    Hypertension Mother    Liver disease Mother        autoimmune   Heart disease Mother    Colon polyps Mother    Stroke Father    Hypertension Father    Hypertension Maternal Grandmother    Heart disease Maternal Grandmother    Bone cancer Paternal Grandfather    Bone cancer Maternal Uncle    Arthritis Paternal Uncle    Breast cancer Cousin     Medications: Patient's Medications  New Prescriptions   No medications on file  Previous Medications   ACETAMINOPHEN (TYLENOL) 500 MG TABLET    Take 1,000 mg by mouth every 6 (six) hours as needed for mild pain.   ASPIRIN EC 81 MG EC TABLET    Take 1 tablet (81 mg total) by mouth daily.   CARVEDILOL (COREG) 6.25 MG TABLET    Take 1 tablet (6.25 mg total) by mouth 2 (two) times daily.   CHOLECALCIFEROL (D3) 50 MCG (2000 UT) TABS    Take 2,000 Units by mouth daily.   FERROUS SULFATE 325 (65 FE) MG EC TABLET    Take 1 tablet (325 mg total) by mouth 2 (two) times daily.   FLUTICASONE (FLONASE) 50 MCG/ACT NASAL SPRAY    USE 1 SPRAY(S) IN EACH NOSTRIL ONCE DAILY AS NEEDED FOR ALLERGIES OR RHINITIS   FUROSEMIDE (LASIX) 40 MG TABLET    Take 1 tablet by mouth once daily   LEVOTHYROXINE (SYNTHROID) 100 MCG TABLET    TAKE 1 TABLET BY MOUTH ONCE DAILY BEFORE BREAKFAST   LISINOPRIL (ZESTRIL) 5 MG TABLET    TAKE 1 TABLET BY MOUTH AT BEDTIME   MIRTAZAPINE (REMERON) 15 MG TABLET    TAKE 1 TABLET BY MOUTH ONCE DAILY AT BEDTIME   OXYCODONE-ACETAMINOPHEN (PERCOCET/ROXICET) 5-325 MG TABLET    Take 1 tablet by mouth every 6 (six) hours as  needed for severe pain.   ROSUVASTATIN (CRESTOR) 40 MG TABLET    Take 1 tablet (40 mg total) by mouth daily.   TRAZODONE (DESYREL) 150 MG TABLET    TAKE 1 TABLET BY MOUTH ONCE DAILY AT BEDTIME  Modified Medications   No medications on file  Discontinued Medications   No medications on file    Physical Exam:  Vitals:   04/21/22 0917 04/21/22 0942  BP: (!) 148/98 136/88  Pulse: 88   Temp: 98.2 F (36.8 C)   SpO2: 99%    There is no height or weight on file to calculate BMI. Wt Readings from Last 3 Encounters:  04/15/22 142 lb (64.4 kg)  11/15/21 141 lb 11.2 oz (64.3 kg)  10/25/21 140 lb (63.5 kg)    Physical Exam Constitutional:  General: She is not in acute distress.    Appearance: She is well-developed. She is diaphoretic (ongoing).  HENT:     Head: Normocephalic and atraumatic.     Mouth/Throat:     Pharynx: No oropharyngeal exudate.  Eyes:     Conjunctiva/sclera: Conjunctivae normal.     Pupils: Pupils are equal, round, and reactive to light.  Cardiovascular:     Rate and Rhythm: Normal rate and regular rhythm.     Heart sounds: Normal heart sounds.  Pulmonary:     Effort: Pulmonary effort is normal.     Breath sounds: Normal breath sounds.  Abdominal:     General: Bowel sounds are normal.     Palpations: Abdomen is soft.  Musculoskeletal:     Cervical back: Normal range of motion and neck supple.     Right lower leg: No edema.     Left lower leg: No edema.     Comments: Right ankle/lower leg wrapped with boot.   Skin:    General: Skin is warm.  Neurological:     Mental Status: She is alert and oriented to person, place, and time.     Gait: Gait abnormal.  Psychiatric:        Mood and Affect: Mood normal.      Labs reviewed: Basic Metabolic Panel: Recent Labs    04/22/21 0927 04/25/21 1343 10/25/21 0947 11/12/21 0857 11/15/21 1302  NA 135  --  138  --  139  K 3.8  --  3.8  --  3.7  CL 98  --  101  --  106  CO2 26  --  26  --  27   GLUCOSE 80  --  71  --  78  BUN 14  --  23  --  14  CREATININE 1.02  --  1.13*  --  0.94  CALCIUM 10.6*   < > CANCELED  10.9* 10.2 10.1  TSH 2.80  --  4.49  --   --    < > = values in this interval not displayed.   Liver Function Tests: Recent Labs    04/22/21 0927 10/25/21 0947 11/12/21 0857 11/15/21 1302  AST 22 26  --  34  ALT 9 17  --  20  ALKPHOS  --   --   --  54  BILITOT 0.3 0.3  --  0.3  PROT 9.6* 9.6* 8.6* 9.4*  ALBUMIN  --   --   --  4.5   No results for input(s): "LIPASE", "AMYLASE" in the last 8760 hours. No results for input(s): "AMMONIA" in the last 8760 hours. CBC: Recent Labs    04/22/21 0927 10/25/21 0947 11/15/21 1302  WBC 5.2 4.8 4.9  NEUTROABS 3,182 2,722 2.4  HGB 13.4 13.4 11.7*  HCT 42.0 42.1 36.7  MCV 88.4 86.6 87.4  PLT 163 148 147*   Lipid Panel: Recent Labs    04/22/21 0927  CHOL 193  HDL 66  LDLCALC 112*  TRIG 65  CHOLHDL 2.9   TSH: Recent Labs    04/22/21 0927 10/25/21 0947  TSH 2.80 4.49   A1C: Lab Results  Component Value Date   HGBA1C 5.4 06/08/2019     Assessment/Plan 1. Pre-op examination -completed today -she is stable for surgery pending cardiology approval.  - CBC with Differential/Platelet - CMP with eGFR([facility])  2. Need for influenza vaccination - Flu Vaccine QUAD 91moIM (Fluarix, Fluzone & Alfiuria Quad PF)  3. Excessive sweating -ongoing, she  stopped wellbutrin but sweating has continued. Discussed restarting if needed  4. Chronic systolic congestive heart failure (HCC) -stable, continues on lisinopril and coreg  5. Cardiomyopathy, nonischemic (HCC) -stable, will have evaluation from cardiology tomorrow, no chest pains or shortness of breath.   6. Hypothyroid TSH at goal   Follow up: 05/19/2022 Janett Billow K. [place], [facility] [phone]

## 2022-04-22 DIAGNOSIS — S82851A Displaced trimalleolar fracture of right lower leg, initial encounter for closed fracture: Secondary | ICD-10-CM | POA: Diagnosis not present

## 2022-04-23 ENCOUNTER — Other Ambulatory Visit: Payer: Self-pay

## 2022-04-23 ENCOUNTER — Encounter (HOSPITAL_COMMUNITY): Payer: Self-pay | Admitting: Orthopaedic Surgery

## 2022-04-23 NOTE — Anesthesia Preprocedure Evaluation (Signed)
Anesthesia Evaluation  Patient identified by MRN, date of birth, ID band Patient awake    Reviewed: Allergy & Precautions, NPO status , Patient's Chart, lab work & pertinent test results  Airway Mallampati: II  TM Distance: >3 FB Neck ROM: Full    Dental  (+) Edentulous Upper, Edentulous Lower   Pulmonary neg pulmonary ROS, former smoker,    Pulmonary exam normal breath sounds clear to auscultation       Cardiovascular hypertension, Pt. on medications + CAD and +CHF  Normal cardiovascular exam Rhythm:Regular Rate:Normal     Neuro/Psych Anxiety Depression negative neurological ROS  negative psych ROS   GI/Hepatic negative GI ROS, Neg liver ROS,   Endo/Other  Hypothyroidism   Renal/GU negative Renal ROS  negative genitourinary   Musculoskeletal negative musculoskeletal ROS (+)   Abdominal   Peds negative pediatric ROS (+)  Hematology negative hematology ROS (+)   Anesthesia Other Findings   Reproductive/Obstetrics negative OB ROS                           Anesthesia Physical Anesthesia Plan  ASA: 3  Anesthesia Plan: General and Regional   Post-op Pain Management: Regional block* and Minimal or no pain anticipated   Induction: Intravenous  PONV Risk Score and Plan: 3 and Ondansetron, Dexamethasone, Midazolam and Treatment may vary due to age or medical condition  Airway Management Planned: LMA  Additional Equipment:   Intra-op Plan:   Post-operative Plan: Extubation in OR  Informed Consent: I have reviewed the patients History and Physical, chart, labs and discussed the procedure including the risks, benefits and alternatives for the proposed anesthesia with the patient or authorized representative who has indicated his/her understanding and acceptance.     Dental advisory given  Plan Discussed with: CRNA  Anesthesia Plan Comments: (PAT note by Miranda Caldwell,  PA-C:  Follows with cardiology for hx of HLD, HTN, mild nonobstructive CAD,chronic systolic HF, NICM, with previous EFat15%with recovery. Her EKG has beenchronically abnormal with T wave inversion in several leads.She has hadAVR with Dr. Cyndia [name] on 03/12/2017 with23 mm Edwards Magna-Ease pericardial valve and MV annuloplasty with 28 mm Sorin 3D MEMO ringafter presenting with acute heart failure/severe AI.She was treated with coumadin for 3 months and then switched over to aspirin.Mild coronary disease at time of cath. She had preop eval by Miranda [name] 04/21/22. Per note, "MirandaBrooks's perioperative risk of a major cardiac event is0.9% according to the Revised Cardiac Risk Index (RCRI). Therefore, [facility]is at [facility] for perioperative complications. Herfunctional capacity is poorat 3.3METs according to the Duke Activity Status Index (DASI). Recommendations: Prior cardiac catheterization showed minimal nonobstructive coronary artery disease. This was prior to her mitral valve repair. Her ejection fraction improved post repair. She may proceed with ankle surgery with low cardiac risk. -Miranda [name]."  She was also seen by her PCP Miranda Mustache, NP on 05/01/22 and deemed stable for surgery from medical standpoint. CMP and CBC done at that time were unremarkable.   Pt will need DOS evaluation.  EKG 08/09/2021: Sinus Rhythm, Rate 73.  TTE 05/14/2020: 1. Left ventricular ejection fraction, by estimation, is 60 to 65%. The  left ventricle has normal function. The left ventricle has no regional  wall motion abnormalities. There is moderate left ventricular hypertrophy.  Left ventricular diastolic  parameters are consistent with Grade II diastolic dysfunction  (pseudonormalization). Elevated left atrial pressure.  2. Right ventricular systolic function is mildly reduced. The right  ventricular size  is normal. There is normal pulmonary artery systolic  pressure. The estimated right  ventricular systolic pressure is 32.2 mmHg.  3. Left atrial size was severely dilated.  4. The mitral valve has been repaired/replaced. There is a 28 mm  prosthetic annuloplasty ring present in the mitral position. Mild to  moderate mitral valve regurgitation. Mild mitral stenosis. MG 4 mmHg at HR  62 bpm, MVA 1.9 cm^2 by continuity equation.  5. The aortic valve has been repaired/replaced. There is a 23 mm  bioprosthetic valve present in the aortic position. Aortic valve  regurgitation is not visualized. Echo findings are consistent with normal  structure and function of the aortic valve  prosthesis. MG 9 mmHg, stable from prior echo on 10/19/17.  6. Aortic dilatation noted. There is mild dilatation of the aortic root,  measuring 42 mm. There is mild dilatation of the ascending aorta,  measuring 40 mm.  7. The inferior vena cava is normal in size with greater than 50%  respiratory variability, suggesting right atrial pressure of 3 mmHg.  R/L Cath 01/09/2017: . Ost RPDA to RPDA lesion, 50 %stenosed. . There is severe left ventricular systolic dysfunction. . Hemodynamic findings consistent with moderate pulmonary hypertension. . There is mild (2+) mitral regurgitation.  Dilated cardiomyopathy with severe global LV dysfunction and an ejection fraction of 15%. There appears to be at least mild to moderate mitral regurgitation.  Moderate elevation of right heart pressures with moderate pulmonary hypertension with a PA systolic pressure of 60 mmHg.  Mildly dilated aortic root with moderately severe aortic insufficiency.  Mild coronary obstructive disease with a normal LAD, normal small intermediate vessel, normal left circumflex, and a large dominant RCA with smooth 50% ostial PDA narrowing.  RECOMMENDATION: The patient will undergo follow-up outpatient evaluation with Dr. Cyndia [name].  )       Anesthesia Quick Evaluation

## 2022-04-23 NOTE — H&P (Signed)
ORTHOPAEDIC SURGERY H&P  Subjective:  The patient presents with right trimalleolar ankle fracture.   Past Medical History:  Diagnosis Date   Anemia    Anxiety    At risk for sudden cardiac death 20-Nov-2012   CHF - Combined Systolic + Diastolic Dysfunction. EF of 15-20% with Grade II diastolic dysfunction on echo 10/27/12 10/28/2012   Coronary artery disease    Depression    Dyspnea    none per pt on 04/23/22   Fibroids    Heart murmur    History of blood transfusion 2014   Hypertension    Hypothyroidism    S/P cardiac catheterization, November 20, 2012, normal coronaries with minimal luminal irregularities in RCA system 10/30/2012   Thyroid disease    hypothyroidism    Past Surgical History:  Procedure Laterality Date   AORTIC VALVE REPLACEMENT N/A 03/12/2017   Procedure: AORTIC VALVE REPLACEMENT (AVR) with Mitral Annuloplasty;  Surgeon: Gaye Pollack, MD;  Location: [place];  Service: Open Heart Surgery;  Laterality: N/A;   LEFT AND RIGHT HEART CATHETERIZATION WITH CORONARY ANGIOGRAM N/A Nov 20, 2012   Procedure: LEFT AND RIGHT HEART CATHETERIZATION WITH CORONARY ANGIOGRAM;  Surgeon: Leonie Man, MD;  Location: [facility] C[facility] LAB;  Service: Cardiovascular;  Laterality: N/A;   MITRAL VALVE REPAIR N/A 03/12/2017   Procedure: MITRAL VALVE REPAIR (MVR);  Surgeon: Gaye Pollack, MD;  Location: [place];  Service: Open Heart Surgery;  Laterality: N/A;  mitral annuloplasty   MULTIPLE EXTRACTIONS WITH ALVEOLOPLASTY N/A 01/21/2017   Procedure: Extraction of tooth #'s 4-13 with alveoloplasty;  Surgeon: Lenn Cal, DDS;  Location: [place];  Service: Oral Surgery;  Laterality: N/A;   RIGHT/LEFT HEART CATH AND CORONARY ANGIOGRAPHY N/A 01/09/2017   Procedure: Right/Left Heart Cath and Coronary Angiography;  Surgeon: Troy Sine, MD;  Location: [place] CV LAB;  Service: Cardiovascular;  Laterality: N/A;   TEE WITHOUT CARDIOVERSION N/A 03/12/2017   Procedure: TRANSESOPHAGEAL ECHOCARDIOGRAM (TEE);   Surgeon: Gaye Pollack, MD;  Location: [place];  Service: Open Heart Surgery;  Laterality: N/A;     (Not in an outpatient encounter)    Allergies  Allergen Reactions   Codeine Hives    Social History   Socioeconomic History   Marital status: Single    Spouse name: Not on file   Number of children: 1   Years of education: 12th   Highest education level: Not on file  Occupational History   Occupation: unemployed    Employer: [employer]  Tobacco Use   Smoking status: Former    Packs/day: 0.50    Years: 5.00    Total pack years: 2.50    Types: Cigars, Cigarettes    Quit date: 08/04/1978    Years since quitting: 43.7   Smokeless tobacco: Never   Tobacco comments:    1 cigar daily-quit 10/24/12    Vaping Use   Vaping Use: Never used  Substance and Sexual Activity   Alcohol use: Yes    Alcohol/week: 3.0 standard drinks of alcohol    Types: 3 Glasses of wine per week   Drug use: Not Currently    Frequency: 5.0 times per week    Types: Marijuana    Comment: none since 2015   Sexual activity: Not Currently    Birth control/protection: Post-menopausal  Other Topics Concern   Not on file  Social History Narrative   Patient lives at home alone. (Condo)   Caffeine use: 1/2 soda daily   Diet   Martial  status:single   Is it one or more stories? Yes   How many persons live in your home? 1   Do you have any pets in your home? No   Current or past profession: Biochemist, clinical   Do you exercise? No   Do you have a living will? No    Do you have a DNR form? No    Do you have signed POA/HPOA forms? No    Social Determinants of Health   Financial Resource Strain: Low Risk  (11/06/2017)   Overall Financial Resource Strain (CARDIA)    Difficulty of Paying Living Expenses: Not hard at all  Food Insecurity: No Food Insecurity (11/22/2019)   Hunger Vital Sign    Worried About Running Out of Food in the Last Year: Never true    Ran Out of Food in the Last Year: Never true   Transportation Needs: No Transportation Needs (11/22/2019)   PRAPARE - Hydrologist (Medical): No    Lack of Transportation (Non-Medical): No  Physical Activity: Insufficiently Active (11/06/2017)   Exercise Vital Sign    Days of Exercise per Week: 5 days    Minutes of Exercise per Session: 20 min  Stress: Stress Concern Present (11/06/2017)   [place]    Feeling of Stress : To some extent  Social Connections: Moderately Isolated (11/06/2017)   Social Connection and Isolation Panel [NHANES]    Frequency of Communication with Friends and Family: More than three times a week    Frequency of Social Gatherings with Friends and Family: More than three times a week    Attends Religious Services: Never    Marine scientist or Organizations: No    Attends Archivist Meetings: Never    Marital Status: Never married  Intimate Partner Violence: Not At Risk (11/06/2017)   Humiliation, Afraid, Rape, and Kick questionnaire    Fear of Current or Ex-Partner: No    Emotionally Abused: No    Physically Abused: No    Sexually Abused: No     History reviewed. No pertinent family history.   Review of Systems Pertinent items are noted in HPI.  Objective: Vital signs in last 24 hours:    04/23/2022    8:06 AM 04/21/2022    9:42 AM 04/21/2022    9:17 AM  Vitals with BMI  Height '5\' 5"'$     Weight 142 lbs    BMI 57.84    Systolic  696 295  Diastolic  88 98  Pulse   88      EXAM: General: Well nourished, well developed. Awake, alert and oriented to time, place, person. Normal mood and affect. No apparent distress. Breathing room air.  Operative Lower Extremity: Alignment - Neutral Deformity - None Large areas of serous/serosanguinous blistering around ankle Skin intact Tenderness to palpation - right ankle 5/5 TA, PT, GS, Per, EHL, FHL Sensation intact to light touch  throughout Palpable DP and PT pulses Special testing: None  The contralateral foot/ankle was examined for comparison and noted to be neurovascularly intact with no localized deformity, swelling, or tenderness.  Imaging Review All images taken were independently reviewed by me.  Assessment/Plan: The clinical and radiographic findings were reviewed and discussed at length with the patient.  The patient has right trimalleolar ankle fracture.  We spoke at length about the natural course of these findings. We discussed nonoperative and operative treatment options in detail.  The  risks and benefits were presented and reviewed. Specifically, we discussed the high likelihood of external fixation with delayed definitive fixation pending soft tissue recovery. The risks due to implant failure/irritation, infection, stiffness, nerve/vessel/tendon injury, wound healing issues, failure of this surgery, need for further surgery, thromboembolic events, amputation, death among others were discussed. The patient acknowledged the explanation and agreed to proceed with the plan.  Armond Hang  Orthopaedic Surgery [facility]

## 2022-04-23 NOTE — Progress Notes (Signed)
Anesthesia Chart Review:a Same day workup  Follows with cardiology for hx of HLD, HTN, mild nonobstructive CAD, chronic systolic HF, NICM, with previous EF at 15% with recovery. Her EKG has been chronically abnormal with T wave inversion in several leads. She has had  AVR with Dr. Cyndia Bent on 03/12/2017 with 23 mm Edwards Magna-Ease pericardial valve and MV annuloplasty with 28 mm Sorin 3D MEMO ring after presenting with acute heart failure/severe AI. She was treated with coumadin for 3 months and then switched over to aspirin. Mild coronary disease at time of cath. She had preop eval by Nicholes Rough 04/21/22. Per note, "Ms. Casas's perioperative risk of a major cardiac event is 0.9% according to the Revised Cardiac Risk Index (RCRI).  Therefore, she is at low risk for perioperative complications.   Her functional capacity is poor at 3.3 METs according to the Duke Activity Status Index (DASI). Recommendations: Prior cardiac catheterization showed minimal nonobstructive coronary artery disease.  This was prior to her mitral valve repair.  Her ejection fraction improved post repair.  She may proceed with ankle surgery with low cardiac risk. -Dr. Marlou Porch."  She was also seen by her PCP Sherrie Mustache, NP on 05/01/22 and deemed stable for surgery from medical standpoint. CMP and CBC done at that time were unremarkable.   Pt will need DOS evaluation.  EKG 08/09/2021: Sinus Rhythm, Rate 73.  TTE 05/14/2020:  1. Left ventricular ejection fraction, by estimation, is 60 to 65%. The  left ventricle has normal function. The left ventricle has no regional  wall motion abnormalities. There is moderate left ventricular hypertrophy.  Left ventricular diastolic  parameters are consistent with Grade II diastolic dysfunction  (pseudonormalization). Elevated left atrial pressure.   2. Right ventricular systolic function is mildly reduced. The right  ventricular size is normal. There is normal pulmonary artery systolic   pressure. The estimated right ventricular systolic pressure is 59.9 mmHg.   3. Left atrial size was severely dilated.   4. The mitral valve has been repaired/replaced. There is a 28 mm  prosthetic annuloplasty ring present in the mitral position. Mild to  moderate mitral valve regurgitation. Mild mitral stenosis. MG 4 mmHg at HR  62 bpm, MVA 1.9 cm^2 by continuity equation.   5. The aortic valve has been repaired/replaced. There is a 23 mm  bioprosthetic valve present in the aortic position. Aortic valve  regurgitation is not visualized. Echo findings are consistent with normal  structure and function of the aortic valve  prosthesis. MG 9 mmHg, stable from prior echo on 10/19/17.   6. Aortic dilatation noted. There is mild dilatation of the aortic root,  measuring 42 mm. There is mild dilatation of the ascending aorta,  measuring 40 mm.   7. The inferior vena cava is normal in size with greater than 50%  respiratory variability, suggesting right atrial pressure of 3 mmHg.  R/L Cath 01/09/2017: Ost RPDA to RPDA lesion, 50 %stenosed. There is severe left ventricular systolic dysfunction. Hemodynamic findings consistent with moderate pulmonary hypertension. There is mild (2+) mitral regurgitation.   Dilated cardiomyopathy with severe global LV dysfunction and an ejection fraction of 15%.  There appears to be at least mild to moderate mitral regurgitation.   Moderate elevation of right heart pressures with moderate pulmonary hypertension with a PA systolic pressure of 60 mmHg.   Mildly dilated aortic root with  moderately severe aortic insufficiency.   Mild coronary obstructive disease with a normal LAD, normal small intermediate vessel, normal left  circumflex, and a large dominant RCA with smooth 50% ostial PDA narrowing.   RECOMMENDATION: The patient will undergo follow-up outpatient evaluation with Dr. Cyndia Bent.    Wynonia Musty [facility] Short Stay Center/Anesthesiology Phone [phone] 04/23/2022 11:28 AM

## 2022-04-23 NOTE — Progress Notes (Signed)
PCP - Sherrie Mustache, NP Cardiologist - Dr Candee Furbish   Chest x-ray - n/a EKG - 08/09/21 Stress Test - 05/10/13 ECHO - 05/14/20 Cardiac Cath - 01/09/17  ICD Pacemaker/Loop - n/a  Sleep Study -  n/a CPAP - none  Aspirin Instructions: Follow your surgeon's instructions on when to stop aspirin prior to surgery,  If no instructions were given by your surgeon then you will need to call the office for those instructions.  ERAS: Clear liquids til 11:30 AM DOS.  Anesthesia review: Yes  STOP now taking any Aspirin (unless otherwise instructed by your surgeon), Aleve, Naproxen, Ibuprofen, Motrin, Advil, Goody's, BC's, all herbal medications, fish oil, and all vitamins.   Coronavirus Screening Do you have any of the following symptoms:  Cough yes/no: No Fever (>100.60F)  yes/no: No Runny nose yes/no: No Sore throat yes/no: No Difficulty breathing/shortness of breath  yes/no: No  Have you traveled in the last 14 days and where? yes/no: No  Patient verbalized understanding of instructions that were given via phone.

## 2022-04-24 ENCOUNTER — Ambulatory Visit (HOSPITAL_COMMUNITY): Payer: HMO | Admitting: Physician Assistant

## 2022-04-24 ENCOUNTER — Encounter (HOSPITAL_COMMUNITY): Payer: Self-pay | Admitting: Orthopaedic Surgery

## 2022-04-24 ENCOUNTER — Encounter (HOSPITAL_COMMUNITY)
Admission: RE | Disposition: A | Discharge status: Home / Self Care | Payer: Self-pay | Source: Home / Self Care | Attending: Orthopaedic Surgery

## 2022-04-24 ENCOUNTER — Ambulatory Visit (HOSPITAL_COMMUNITY)
Admission: RE | Admit: 2022-04-24 | Discharge: 2022-04-24 | Disposition: A | Discharge status: Home / Self Care | Payer: HMO | Attending: Orthopaedic Surgery | Admitting: Orthopaedic Surgery

## 2022-04-24 ENCOUNTER — Ambulatory Visit (HOSPITAL_COMMUNITY): Payer: HMO

## 2022-04-24 ENCOUNTER — Ambulatory Visit (HOSPITAL_BASED_OUTPATIENT_CLINIC_OR_DEPARTMENT_OTHER): Payer: HMO | Admitting: Physician Assistant

## 2022-04-24 DIAGNOSIS — X58XXXA Exposure to other specified factors, initial encounter: Secondary | ICD-10-CM | POA: Insufficient documentation

## 2022-04-24 DIAGNOSIS — F419 Anxiety disorder, unspecified: Secondary | ICD-10-CM | POA: Diagnosis not present

## 2022-04-24 DIAGNOSIS — I5020 Unspecified systolic (congestive) heart failure: Secondary | ICD-10-CM | POA: Diagnosis not present

## 2022-04-24 DIAGNOSIS — E039 Hypothyroidism, unspecified: Secondary | ICD-10-CM | POA: Diagnosis not present

## 2022-04-24 DIAGNOSIS — I5040 Unspecified combined systolic (congestive) and diastolic (congestive) heart failure: Secondary | ICD-10-CM | POA: Diagnosis not present

## 2022-04-24 DIAGNOSIS — Z87891 Personal history of nicotine dependence: Secondary | ICD-10-CM

## 2022-04-24 DIAGNOSIS — I110 Hypertensive heart disease with heart failure: Secondary | ICD-10-CM

## 2022-04-24 DIAGNOSIS — F32A Depression, unspecified: Secondary | ICD-10-CM | POA: Diagnosis not present

## 2022-04-24 DIAGNOSIS — S82851A Displaced trimalleolar fracture of right lower leg, initial encounter for closed fracture: Secondary | ICD-10-CM | POA: Diagnosis not present

## 2022-04-24 DIAGNOSIS — I2510 Atherosclerotic heart disease of native coronary artery without angina pectoris: Secondary | ICD-10-CM | POA: Insufficient documentation

## 2022-04-24 HISTORY — PX: EXTERNAL FIXATION LEG: SHX1549

## 2022-04-24 SURGERY — EXTERNAL FIXATION, LOWER EXTREMITY
Anesthesia: Regional | Site: Ankle | Laterality: Right

## 2022-04-24 MED ORDER — DEXAMETHASONE SODIUM PHOSPHATE 10 MG/ML IJ SOLN
INTRAMUSCULAR | Status: DC | PRN
  Administered 2022-04-24: 8 mg via INTRAVENOUS

## 2022-04-24 MED ORDER — SODIUM CHLORIDE 0.9 % IR SOLN
Status: DC | PRN
  Administered 2022-04-24: 1000 mL

## 2022-04-24 MED ORDER — EPHEDRINE SULFATE-NACL 50-0.9 MG/10ML-% IV SOSY
PREFILLED_SYRINGE | INTRAVENOUS | Status: DC | PRN
  Administered 2022-04-24: 5 mg via INTRAVENOUS

## 2022-04-24 MED ORDER — MIDAZOLAM HCL 2 MG/2ML IJ SOLN
INTRAMUSCULAR | Status: AC
  Filled 2022-04-24: qty 2

## 2022-04-24 MED ORDER — FENTANYL CITRATE (PF) 250 MCG/5ML IJ SOLN
INTRAMUSCULAR | Status: AC
  Filled 2022-04-24: qty 5

## 2022-04-24 MED ORDER — LACTATED RINGERS IV SOLN: INTRAVENOUS | Status: DC

## 2022-04-24 MED ORDER — CHLORHEXIDINE GLUCONATE 0.12 % MT SOLN
15.0000 mL | OROMUCOSAL | Status: AC
  Filled 2022-04-24: qty 15

## 2022-04-24 MED ORDER — PROPOFOL 10 MG/ML IV BOLUS
INTRAVENOUS | Status: DC | PRN
  Administered 2022-04-24: 200 mg via INTRAVENOUS

## 2022-04-24 MED ORDER — CHLORHEXIDINE GLUCONATE 0.12 % MT SOLN
OROMUCOSAL | Status: AC
  Administered 2022-04-24: 15 mL via OROMUCOSAL
  Filled 2022-04-24: qty 15

## 2022-04-24 MED ORDER — FENTANYL CITRATE (PF) 100 MCG/2ML IJ SOLN: 100.0000 ug | Freq: Once | INTRAMUSCULAR | Status: AC

## 2022-04-24 MED ORDER — LIDOCAINE 2% (20 MG/ML) 5 ML SYRINGE
INTRAMUSCULAR | Status: DC | PRN
  Administered 2022-04-24: 60 mg via INTRAVENOUS

## 2022-04-24 MED ORDER — MIDAZOLAM HCL 2 MG/2ML IJ SOLN: 2.0000 mg | Freq: Once | INTRAMUSCULAR | Status: AC

## 2022-04-24 MED ORDER — DEXAMETHASONE SODIUM PHOSPHATE 10 MG/ML IJ SOLN
INTRAMUSCULAR | Status: AC
  Filled 2022-04-24: qty 1

## 2022-04-24 MED ORDER — PROPOFOL 10 MG/ML IV BOLUS
INTRAVENOUS | Status: AC
  Filled 2022-04-24: qty 20

## 2022-04-24 MED ORDER — FENTANYL CITRATE (PF) 100 MCG/2ML IJ SOLN
INTRAMUSCULAR | Status: AC
  Administered 2022-04-24: 100 ug via INTRAVENOUS
  Filled 2022-04-24: qty 2

## 2022-04-24 MED ORDER — ONDANSETRON HCL 4 MG/2ML IJ SOLN
INTRAMUSCULAR | Status: DC | PRN
  Administered 2022-04-24: 4 mg via INTRAVENOUS

## 2022-04-24 MED ORDER — CEFAZOLIN SODIUM-DEXTROSE 2-4 GM/100ML-% IV SOLN
2.0000 g | INTRAVENOUS | Status: AC
  Administered 2022-04-24: 2 g via INTRAVENOUS
  Filled 2022-04-24: qty 100

## 2022-04-24 MED ORDER — PHENYLEPHRINE HCL-NACL 20-0.9 MG/250ML-% IV SOLN
INTRAVENOUS | Status: DC | PRN
  Administered 2022-04-24: 50 ug/min via INTRAVENOUS
  Administered 2022-04-24: 30 ug/min via INTRAVENOUS

## 2022-04-24 MED ORDER — ROPIVACAINE HCL 5 MG/ML IJ SOLN
INTRAMUSCULAR | Status: DC | PRN
  Administered 2022-04-24: 50 mL via PERINEURAL

## 2022-04-24 MED ORDER — CHLORHEXIDINE GLUCONATE 4 % EX LIQD: 60.0000 mL | Freq: Once | CUTANEOUS | Status: DC

## 2022-04-24 MED ORDER — ONDANSETRON HCL 4 MG/2ML IJ SOLN
INTRAMUSCULAR | Status: AC
  Filled 2022-04-24: qty 2

## 2022-04-24 MED ORDER — MIDAZOLAM HCL 2 MG/2ML IJ SOLN
INTRAMUSCULAR | Status: AC
  Administered 2022-04-24: 2 mg via INTRAVENOUS
  Filled 2022-04-24: qty 2

## 2022-04-24 MED ORDER — POVIDONE-IODINE 10 % EX SWAB: 2.0000 | Freq: Once | CUTANEOUS | Status: DC

## 2022-04-24 SURGICAL SUPPLY — 70 items
ANCHOR SUT KEITH ABD SZ2 STR (SUTURE) ×1 IMPLANT
BAG COUNTER SPONGE SURGICOUNT (BAG) IMPLANT
BANDAGE ESMARK 6X9 LF (GAUZE/BANDAGES/DRESSINGS) ×1 IMPLANT
BAR GLASS FIBER EXFX 11X400 (EXFIX) IMPLANT
BLADE LONG MED 31X9 (MISCELLANEOUS) ×1 IMPLANT
BLADE SURG 15 STRL LF DISP TIS (BLADE) ×2 IMPLANT
BLADE SURG 15 STRL SS (BLADE) ×2
BNDG COHESIVE 4X5 TAN STRL LF (GAUZE/BANDAGES/DRESSINGS) ×1 IMPLANT
BNDG COHESIVE 6X5 TAN NS LF (GAUZE/BANDAGES/DRESSINGS) ×1 IMPLANT
BNDG COHESIVE 6X5 TAN ST LF (GAUZE/BANDAGES/DRESSINGS) ×1 IMPLANT
BNDG ELASTIC 4X5.8 VLCR STR LF (GAUZE/BANDAGES/DRESSINGS) ×1 IMPLANT
BNDG ELASTIC 6X5.8 VLCR STR LF (GAUZE/BANDAGES/DRESSINGS) ×1 IMPLANT
BNDG ESMARK 6X9 LF (GAUZE/BANDAGES/DRESSINGS) ×1
BNDG GAUZE DERMACEA FLUFF 4 (GAUZE/BANDAGES/DRESSINGS) ×1 IMPLANT
CAP PROTECTIVE TRANSFX 4.5X5MM (EXFIX) IMPLANT
CHLORAPREP W/TINT 26 (MISCELLANEOUS) ×2 IMPLANT
CLAMP BLUE BAR TO PIN (EXFIX) IMPLANT
COVER SURGICAL LIGHT HANDLE (MISCELLANEOUS) ×1 IMPLANT
CUFF TOURN SGL QUICK 34 (TOURNIQUET CUFF) ×1
CUFF TRNQT CYL 34X4.125X (TOURNIQUET CUFF) ×1 IMPLANT
DRAPE C-ARM 42X120 X-RAY (DRAPES) IMPLANT
DRAPE C-ARMOR (DRAPES) ×1 IMPLANT
DRAPE EXTREMITY T 121X128X90 (DISPOSABLE) ×1 IMPLANT
DRAPE OEC MINIVIEW 54X84 (DRAPES) ×1 IMPLANT
DRAPE ORTHO SPLIT 77X108 STRL (DRAPES) ×2
DRAPE SURG ORHT 6 SPLT 77X108 (DRAPES) ×2 IMPLANT
DRAPE U-SHAPE 47X51 STRL (DRAPES) ×1 IMPLANT
DRSG ADAPTIC 3X8 NADH LF (GAUZE/BANDAGES/DRESSINGS) ×1 IMPLANT
ELECT REM PT RETURN 15FT ADLT (MISCELLANEOUS) ×1 IMPLANT
FACESHIELD WRAPAROUND (MASK) ×1 IMPLANT
FACESHIELD WRAPAROUND OR TEAM (MASK) ×1 IMPLANT
GAUZE PAD ABD 8X10 STRL (GAUZE/BANDAGES/DRESSINGS) ×5 IMPLANT
GAUZE SPONGE 4X4 12PLY STRL (GAUZE/BANDAGES/DRESSINGS) ×2 IMPLANT
GAUZE XEROFORM 5X9 LF (GAUZE/BANDAGES/DRESSINGS) ×1 IMPLANT
GLOVE SURG ENC MOIS LTX SZ7.5 (GLOVE) ×2 IMPLANT
GLOVE SURG MICRO LTX SZ7.5 (GLOVE) ×2 IMPLANT
GLOVE SURG POLYISO LF SZ7.5 (GLOVE) ×1 IMPLANT
GLOVE SURG UNDER POLY LF SZ7.5 (GLOVE) ×2 IMPLANT
GOWN STRL REUS W/ TWL LRG LVL3 (GOWN DISPOSABLE) ×1 IMPLANT
GOWN STRL REUS W/TWL LRG LVL3 (GOWN DISPOSABLE) ×1
KIT BASIN OR (CUSTOM PROCEDURE TRAY) ×1 IMPLANT
KIT TURNOVER KIT A (KITS) IMPLANT
NDL MAYO CATGUT SZ4 TPR NDL (NEEDLE) IMPLANT
NEEDLE HYPO 22GX1.5 SAFETY (NEEDLE) ×1 IMPLANT
NEEDLE MAYO CATGUT SZ4 (NEEDLE) IMPLANT
NS IRRIG 1000ML POUR BTL (IV SOLUTION) ×1 IMPLANT
PACK ORTHO EXTREMITY (CUSTOM PROCEDURE TRAY) ×1 IMPLANT
PAD CAST 4YDX4 CTTN HI CHSV (CAST SUPPLIES) ×6 IMPLANT
PADDING CAST COTTON 4X4 STRL (CAST SUPPLIES) ×6
PADDING CAST COTTON 6X4 STRL (CAST SUPPLIES) ×1 IMPLANT
PIN CLAMP 2BAR 75MM BLUE (EXFIX) IMPLANT
PIN HALF YELLOW 5X160X35 (EXFIX) IMPLANT
PIN TRANSFIXING 5.0 (EXFIX) IMPLANT
PROTECTOR NERVE ULNAR (MISCELLANEOUS) ×1 IMPLANT
SPONGE T-LAP 18X18 ~~LOC~~+RFID (SPONGE) ×1 IMPLANT
STAPLER VISISTAT 35W (STAPLE) ×1 IMPLANT
STOCKINETTE TUBULAR 6 INCH (GAUZE/BANDAGES/DRESSINGS) ×1 IMPLANT
STOCKINETTE TUBULAR COTT 4X25 (GAUZE/BANDAGES/DRESSINGS) ×1 IMPLANT
STRIP CLOSURE SKIN 1/2X4 (GAUZE/BANDAGES/DRESSINGS) ×1 IMPLANT
SUCTION FRAZIER HANDLE 10FR (MISCELLANEOUS) ×1
SUCTION TUBE FRAZIER 10FR DISP (MISCELLANEOUS) ×1 IMPLANT
SUT ETHILON 3 0 PS 1 (SUTURE) ×1 IMPLANT
SUT MON AB 3-0 SH 27 (SUTURE) ×1
SUT MON AB 3-0 SH27 (SUTURE) ×1 IMPLANT
SUT VIC AB 2-0 SH 27 (SUTURE) ×1
SUT VIC AB 2-0 SH 27XBRD (SUTURE) ×1 IMPLANT
SUT VIC AB 3-0 SH 27 (SUTURE) ×2
SUT VIC AB 3-0 SH 27X BRD (SUTURE) ×2 IMPLANT
SYR CONTROL 10ML LL (SYRINGE) ×1 IMPLANT
WATER STERILE IRR 1000ML POUR (IV SOLUTION) ×1 IMPLANT

## 2022-04-24 NOTE — H&P (Signed)
H&P Update: ? ?-History and Physical Reviewed ? ?-Patient has been re-examined ? ?-No change in the plan of care ? ?-The risks and benefits were presented and reviewed. The risks due to hardware failure/irritation, new/persistent infection, stiffness, nerve/vessel/tendon injury, nonunion/malunion, wound healing issues, development of arthritis, failure of this surgery, possibility of external fixation with delayed definitive surgery, need for further surgery, thromboembolic events, anesthesia/medical complications, amputation, death among others were discussed. The patient acknowledged the explanation, agreed to proceed with the plan and a consent was signed. ? ?Miranda [name] ? ?

## 2022-04-24 NOTE — Transfer of Care (Signed)
Immediate Anesthesia Transfer of Care Note  Patient: Miranda [name]  Procedure(s) Performed: EXTERNAL FIXATION RIGHT LEG (Right: Ankle)  Patient Location: PACU  Anesthesia Type:General  Level of Consciousness: awake, alert  and oriented  Airway & Oxygen Therapy: Patient connected to face mask oxygen  Post-op Assessment: Post -op Vital signs reviewed and stable  Post vital signs: stable  Last Vitals:  Vitals Value Taken Time  BP 125/96 04/24/22 1424  Temp    Pulse    Resp 14 04/24/22 1426  SpO2    Vitals shown include unvalidated device data.  Last Pain:  Vitals:   04/24/22 1210  TempSrc:   PainSc: 0-No pain      Patients Stated Pain Goal: 0 (78/67/67 2094)  Complications: No notable events documented.

## 2022-04-24 NOTE — Anesthesia Procedure Notes (Signed)
Procedure Name: LMA Insertion Date/Time: 04/24/2022 12:45 PM  Performed by: Leonor Liv, CRNAPre-anesthesia Checklist: Patient identified, Emergency Drugs available, Suction available and Patient being monitored Patient Re-evaluated:Patient Re-evaluated prior to induction Oxygen Delivery Method: Circle System Utilized Preoxygenation: Pre-oxygenation with 100% oxygen Induction Type: IV induction Ventilation: Mask ventilation without difficulty LMA: LMA inserted LMA Size: 4.0 Number of attempts: 1 Placement Confirmation: positive ETCO2 Tube secured with: Tape Dental Injury: Teeth and Oropharynx as per pre-operative assessment

## 2022-04-24 NOTE — Anesthesia Procedure Notes (Signed)
Anesthesia Regional Block: Popliteal block   Pre-Anesthetic Checklist: , timeout performed,  Correct Patient, Correct Site, Correct Laterality,  Correct Procedure, Correct Position, site marked,  Risks and benefits discussed,  Surgical consent,  Pre-op evaluation,  At surgeon's request and post-op pain management  Laterality: Right  Prep: chloraprep       Needles:  Injection technique: Single-shot  Needle Type: Stimiplex     Needle Length: 9cm  Needle Gauge: 21     Additional Needles:   Procedures:,,,, ultrasound used (permanent image in chart),,    Narrative:  Start time: 04/24/2022 12:11 PM End time: 04/24/2022 12:16 PM Injection made incrementally with aspirations every 5 mL.  Performed by: Personally  Anesthesiologist: Lynda Rainwater, MD

## 2022-04-24 NOTE — Op Note (Signed)
04/24/2022  2:36 PM   PATIENT: Miranda [name]  56 y.o. female  MRN: [id number]   PRE-OPERATIVE DIAGNOSIS:   Displaced trimalleolar fracture of right lower leg   POST-OPERATIVE DIAGNOSIS:   Displaced trimalleolar fracture of right lower leg   PROCEDURE: Open reduction and external fixation of the right ankle (this is part of a staged procedure for delayed definitive fixation at later date)   SURGEON:  Armond Hang, MD   ASSISTANT: None   ANESTHESIA: General, regional   EBL: Minimal   TOURNIQUET:    Total Tourniquet Time Documented: Thigh (Right) - 50 minutes Total: Thigh (Right) - 50 minutes    COMPLICATIONS: None apparent   DISPOSITION: Extubated, awake and stable to recovery.   INDICATION FOR PROCEDURE: The patient presented with right trimalleolar ankle fracture sustained 9 days ago on 04/15/22 when she stepped in a hole while fishing. She was closed reduced in the ED and splinted. She followed up as outpatient. CT noted persistent dislocation of the ankle and clinical exam demonstrated circumferential serous and serosanguinous blistering around the ankle. We discussed doing a staged external fixation with delayed definitive fixation in light of her extensive soft tissue injury.  We discussed the diagnosis, alternative treatment options, risks and benefits of the above surgical intervention, as well as alternative non-operative treatments. All questions/concerns were addressed and the patient/family demonstrated appropriate understanding of the diagnosis, the procedure, the postoperative course, and overall prognosis. The patient wished to proceed with surgical intervention and signed an informed surgical consent as such, in each others presence prior to surgery.   PROCEDURE IN DETAIL: After preoperative consent was obtained and the correct operative site was identified, the patient was brought to the operating room supine on stretcher and transferred onto  operating table. General anesthesia was induced. Preoperative antibiotics were administered. Surgical timeout was taken. The patient was then positioned supine with an ipsilateral hip bump. The leg was examined and noted to have persistent soft tissue blistering and healing wounds which necessitated external fixation today rather than proceeding with definitive internal fixation. The operative lower extremity was prepped and draped in standard sterile fashion with a tourniquet around the thigh. The extremity was exsanguinated and the tourniquet was inflated to 275 mmHg.  A transcalcaneal pin was implanted under fluoroscopy in the posteroinferior calcaneus from medial to lateral across the hindfoot. The position was verified using fluoroscopy.   Two tibial pins were implanted under fluoroscopy in the midshaft in anteroposterior direction across the tibia. Their position was verified using fluoroscopy and confirmed to be of appropriate length.    A standard delta frame external fixator was assembled. Manual reduction was performed with much effort to maintain tibiotalar articulation as the distal tibia kept subluxating anteriorly. After sequential reduction maneuvers, the tibiotalar joint congruency was restored and the fixator was secured. Intraoperative fluoroscopy confirmed appropriate position of the ankle joint. Of note, the posterior malleolar fracture was noted to be persistently shortened and comminuted even with the external fixator.   The surgical sites were thoroughly irrigated. The tourniquet was deflated and hemostasis achieved. The pin sites were dressed and sterile adaptic dressings with gauze were applied. The patient was awakened from anesthesia and transported to the recovery room in stable condition.    FOLLOW UP PLAN: -transfer to PACU, then home -strict NWB operative extremity, maximum elevation -maintain surgical dressings until follow up -DVT ppx: Aspirin 81 mg twice daily while  NWB -follow up as outpatient on 04/29/22 for wound check -plan for return to  OR for definitive right ankle ORIF with removal of external fixation within 10-14 days pending soft tissues   RADIOGRAPHS: AP, lateral, oblique and stress radiographs of the right ankle were obtained intraoperatively. These showed interval reduction and external fixation of the fractures. Manual stress radiographs were taken and the ankle mortise and tibiofibular relationship were noted to be stable following fixation. All hardware is appropriately positioned and of the appropriate lengths. No other acute injuries are noted.   Armond Hang Orthopaedic Surgery [facility]

## 2022-04-24 NOTE — Anesthesia Procedure Notes (Signed)
Anesthesia Regional Block: Adductor canal block   Pre-Anesthetic Checklist: , timeout performed,  Correct Patient, Correct Site, Correct Laterality,  Correct Procedure, Correct Position, site marked,  Risks and benefits discussed,  Surgical consent,  Pre-op evaluation,  At surgeon's request and post-op pain management  Laterality: Right  Prep: chloraprep       Needles:  Injection technique: Single-shot  Needle Type: Stimiplex     Needle Length: 9cm  Needle Gauge: 21     Additional Needles:   Procedures:,,,, ultrasound used (permanent image in chart),,    Narrative:  Start time: 04/24/2022 12:10 PM End time: 04/24/2022 12:15 PM Injection made incrementally with aspirations every 5 mL.  Performed by: Personally  Anesthesiologist: Lynda Rainwater, MD

## 2022-04-25 ENCOUNTER — Encounter (HOSPITAL_COMMUNITY): Payer: Self-pay | Admitting: Orthopaedic Surgery

## 2022-04-25 NOTE — Anesthesia Postprocedure Evaluation (Signed)
Anesthesia Post Note  Patient: Miranda [name]  Procedure(s) Performed: EXTERNAL FIXATION RIGHT LEG (Right: Ankle)     Patient location during evaluation: PACU Anesthesia Type: Regional and General Level of consciousness: awake and alert Pain management: pain level controlled Vital Signs Assessment: post-procedure vital signs reviewed and stable Respiratory status: spontaneous breathing, nonlabored ventilation, respiratory function stable and patient connected to nasal cannula oxygen Cardiovascular status: blood pressure returned to baseline and stable Postop Assessment: no apparent nausea or vomiting Anesthetic complications: no   No notable events documented.  Last Vitals:  Vitals:   04/24/22 1440 04/24/22 1455  BP: (!) 142/69 (!) 140/92  Pulse: 72 71  Resp: 15 16  Temp:  36.6 C  SpO2: 94% 96%    Last Pain:  Vitals:   04/24/22 1455  TempSrc:   PainSc: 0-No pain                 Belenda Cruise P Harkirat Orozco

## 2022-04-28 ENCOUNTER — Other Ambulatory Visit: Payer: Self-pay

## 2022-04-28 ENCOUNTER — Ambulatory Visit: Payer: HMO | Admitting: Nurse Practitioner

## 2022-04-29 NOTE — Discharge Instructions (Signed)
Allycia Pitz, MD [facility]  Please read the following information regarding your care after surgery.  Medications  You only need a prescription for the narcotic pain medicine (ex. oxycodone, Percocet, Norco).  All of the other medicines listed below are available over the counter. ? Aleve 2 pills twice a day for the first 3 days after surgery. ? acetominophen (Tylenol) 650 mg every 4-6 hours as you need for minor to moderate pain ? oxycodone as prescribed for severe pain  ? To help prevent blood clots, take aspirin (81 mg) twice daily for 42 days after surgery (or total duration of nonweightbearing).  You should also get up every hour while you are awake to move around.  Weight Bearing ? Do NOT bear any weight on the operated leg or foot. This means do NOT touch your surgical leg to the ground!  Cast / Splint / Dressing ? If you have a splint, do NOT remove this. Keep your splint, cast or dressing clean and dry.  Don't put anything (coat hanger, pencil, etc) down inside of it.  If it gets wet, call the office immediately to schedule an appointment for a cast change.  Swelling IMPORTANT: It is normal for you to have swelling where you had surgery. To reduce swelling and pain, keep at least 3 pillows under your leg so that your toes are above your nose and your heel is above the level of your hip.  It may be necessary to keep your foot or leg elevated for several weeks.  This is critical to helping your incisions heal and your pain to feel better.  Follow Up Call my office at [phone] when you are discharged from the hospital or surgery center to schedule an appointment to be seen 7-10 days after surgery.  Call my office at [phone] if you develop a fever >101.5 F, nausea, vomiting, bleeding from the surgical site or severe pain.     

## 2022-05-06 ENCOUNTER — Other Ambulatory Visit: Payer: Self-pay

## 2022-05-06 ENCOUNTER — Encounter (HOSPITAL_COMMUNITY): Payer: Self-pay | Admitting: Orthopaedic Surgery

## 2022-05-06 NOTE — Progress Notes (Signed)
Miranda [name] denies chest pain or shortness of breath.  Patient denies having any s/s of Covid in her household, also denies any known exposure to Covid.   Miranda [name]'s PCP is Sherrie Mustache, cardiologist is Dr Candee Furbish.

## 2022-05-07 ENCOUNTER — Ambulatory Visit (HOSPITAL_COMMUNITY)
Admission: RE | Admit: 2022-05-07 | Discharge: 2022-05-07 | Disposition: A | Discharge status: Home / Self Care | Payer: HMO | Attending: Orthopaedic Surgery | Admitting: Orthopaedic Surgery

## 2022-05-07 ENCOUNTER — Encounter (HOSPITAL_COMMUNITY)
Admission: RE | Disposition: A | Discharge status: Home / Self Care | Payer: Self-pay | Source: Home / Self Care | Attending: Orthopaedic Surgery

## 2022-05-07 ENCOUNTER — Other Ambulatory Visit: Payer: Self-pay

## 2022-05-07 ENCOUNTER — Ambulatory Visit (HOSPITAL_COMMUNITY): Payer: HMO | Admitting: Anesthesiology

## 2022-05-07 ENCOUNTER — Ambulatory Visit (HOSPITAL_COMMUNITY): Payer: HMO

## 2022-05-07 ENCOUNTER — Encounter (HOSPITAL_COMMUNITY): Payer: Self-pay | Admitting: Orthopaedic Surgery

## 2022-05-07 ENCOUNTER — Ambulatory Visit (HOSPITAL_BASED_OUTPATIENT_CLINIC_OR_DEPARTMENT_OTHER): Payer: HMO | Admitting: Anesthesiology

## 2022-05-07 DIAGNOSIS — Y9389 Activity, other specified: Secondary | ICD-10-CM | POA: Diagnosis not present

## 2022-05-07 DIAGNOSIS — S82851A Displaced trimalleolar fracture of right lower leg, initial encounter for closed fracture: Secondary | ICD-10-CM | POA: Diagnosis not present

## 2022-05-07 DIAGNOSIS — Z87891 Personal history of nicotine dependence: Secondary | ICD-10-CM | POA: Insufficient documentation

## 2022-05-07 DIAGNOSIS — I10 Essential (primary) hypertension: Secondary | ICD-10-CM | POA: Diagnosis not present

## 2022-05-07 DIAGNOSIS — F419 Anxiety disorder, unspecified: Secondary | ICD-10-CM | POA: Insufficient documentation

## 2022-05-07 DIAGNOSIS — S82301A Unspecified fracture of lower end of right tibia, initial encounter for closed fracture: Secondary | ICD-10-CM | POA: Diagnosis not present

## 2022-05-07 DIAGNOSIS — F418 Other specified anxiety disorders: Secondary | ICD-10-CM

## 2022-05-07 DIAGNOSIS — E039 Hypothyroidism, unspecified: Secondary | ICD-10-CM

## 2022-05-07 DIAGNOSIS — I340 Nonrheumatic mitral (valve) insufficiency: Secondary | ICD-10-CM | POA: Diagnosis not present

## 2022-05-07 DIAGNOSIS — Z79899 Other long term (current) drug therapy: Secondary | ICD-10-CM | POA: Insufficient documentation

## 2022-05-07 DIAGNOSIS — X501XXA Overexertion from prolonged static or awkward postures, initial encounter: Secondary | ICD-10-CM | POA: Insufficient documentation

## 2022-05-07 DIAGNOSIS — S82831A Other fracture of upper and lower end of right fibula, initial encounter for closed fracture: Secondary | ICD-10-CM | POA: Diagnosis not present

## 2022-05-07 DIAGNOSIS — S93431A Sprain of tibiofibular ligament of right ankle, initial encounter: Secondary | ICD-10-CM | POA: Diagnosis not present

## 2022-05-07 HISTORY — PX: EXTERNAL FIXATION REMOVAL: SHX5040

## 2022-05-07 HISTORY — PX: ORIF ANKLE FRACTURE: SHX5408

## 2022-05-07 HISTORY — DX: Chronic kidney disease, unspecified: N18.9

## 2022-05-07 SURGERY — REMOVAL, EXTERNAL FIXATION DEVICE, LOWER EXTREMITY
Anesthesia: General | Site: Leg Lower | Laterality: Right

## 2022-05-07 MED ORDER — CELECOXIB 200 MG PO CAPS
200.0000 mg | ORAL_CAPSULE | Freq: Once | ORAL | Status: AC
  Administered 2022-05-07: 200 mg via ORAL
  Filled 2022-05-07: qty 1

## 2022-05-07 MED ORDER — CHLORHEXIDINE GLUCONATE 0.12 % MT SOLN
15.0000 mL | Freq: Once | OROMUCOSAL | Status: AC
  Administered 2022-05-07: 15 mL via OROMUCOSAL

## 2022-05-07 MED ORDER — BUPIVACAINE HCL 0.5 % IJ SOLN
INTRAMUSCULAR | Status: DC | PRN
  Administered 2022-05-07: 10 mL

## 2022-05-07 MED ORDER — PROPOFOL 10 MG/ML IV BOLUS
INTRAVENOUS | Status: DC | PRN
  Administered 2022-05-07: 160 mg via INTRAVENOUS

## 2022-05-07 MED ORDER — ORAL CARE MOUTH RINSE: 15.0000 mL | Freq: Once | OROMUCOSAL | Status: AC

## 2022-05-07 MED ORDER — PHENYLEPHRINE HCL (PRESSORS) 10 MG/ML IV SOLN
INTRAVENOUS | Status: DC | PRN
  Administered 2022-05-07 (×3): 80 ug via INTRAVENOUS

## 2022-05-07 MED ORDER — ROPIVACAINE HCL 5 MG/ML IJ SOLN
INTRAMUSCULAR | Status: DC | PRN
  Administered 2022-05-07: 15 mL via PERINEURAL

## 2022-05-07 MED ORDER — DEXMEDETOMIDINE HCL IN NACL 80 MCG/20ML IV SOLN
INTRAVENOUS | Status: DC | PRN
  Administered 2022-05-07 (×2): 4 ug via BUCCAL

## 2022-05-07 MED ORDER — ACETAMINOPHEN 500 MG PO TABS
1000.0000 mg | ORAL_TABLET | Freq: Once | ORAL | Status: AC
  Administered 2022-05-07: 1000 mg via ORAL
  Filled 2022-05-07: qty 2

## 2022-05-07 MED ORDER — LABETALOL HCL 5 MG/ML IV SOLN
INTRAVENOUS | Status: AC
  Administered 2022-05-07: 10 mg via INTRAVENOUS
  Filled 2022-05-07: qty 4

## 2022-05-07 MED ORDER — EPHEDRINE 5 MG/ML INJ
INTRAVENOUS | Status: AC
  Filled 2022-05-07: qty 5

## 2022-05-07 MED ORDER — DEXAMETHASONE SODIUM PHOSPHATE 10 MG/ML IJ SOLN
INTRAMUSCULAR | Status: DC | PRN
  Administered 2022-05-07: 5 mg via INTRAVENOUS

## 2022-05-07 MED ORDER — LIDOCAINE 2% (20 MG/ML) 5 ML SYRINGE
INTRAMUSCULAR | Status: AC
  Filled 2022-05-07: qty 10

## 2022-05-07 MED ORDER — ACETAMINOPHEN 325 MG PO TABS: 325.0000 mg | ORAL_TABLET | ORAL | Status: DC | PRN

## 2022-05-07 MED ORDER — CHLORHEXIDINE GLUCONATE 0.12 % MT SOLN
OROMUCOSAL | Status: AC
  Filled 2022-05-07: qty 15

## 2022-05-07 MED ORDER — PHENYLEPHRINE 80 MCG/ML (10ML) SYRINGE FOR IV PUSH (FOR BLOOD PRESSURE SUPPORT)
PREFILLED_SYRINGE | INTRAVENOUS | Status: AC
  Filled 2022-05-07: qty 10

## 2022-05-07 MED ORDER — LABETALOL HCL 5 MG/ML IV SOLN: 10.0000 mg | Freq: Once | INTRAVENOUS | Status: AC

## 2022-05-07 MED ORDER — ONDANSETRON HCL 4 MG/2ML IJ SOLN
INTRAMUSCULAR | Status: DC | PRN
  Administered 2022-05-07: 4 mg via INTRAVENOUS

## 2022-05-07 MED ORDER — OXYCODONE HCL 5 MG/5ML PO SOLN: 5.0000 mg | Freq: Once | ORAL | Status: DC | PRN

## 2022-05-07 MED ORDER — FENTANYL CITRATE (PF) 100 MCG/2ML IJ SOLN
INTRAMUSCULAR | Status: AC
  Administered 2022-05-07: 100 ug
  Filled 2022-05-07: qty 2

## 2022-05-07 MED ORDER — CEFAZOLIN SODIUM-DEXTROSE 2-4 GM/100ML-% IV SOLN
2.0000 g | INTRAVENOUS | Status: AC
  Administered 2022-05-07: 2 g via INTRAVENOUS
  Filled 2022-05-07: qty 100

## 2022-05-07 MED ORDER — LACTATED RINGERS IV SOLN: INTRAVENOUS | Status: DC

## 2022-05-07 MED ORDER — ONDANSETRON HCL 4 MG/2ML IJ SOLN
INTRAMUSCULAR | Status: AC
  Filled 2022-05-07: qty 2

## 2022-05-07 MED ORDER — PHENYLEPHRINE HCL-NACL 20-0.9 MG/250ML-% IV SOLN
INTRAVENOUS | Status: DC | PRN
  Administered 2022-05-07: 60 ug/min via INTRAVENOUS

## 2022-05-07 MED ORDER — PROPOFOL 10 MG/ML IV BOLUS
INTRAVENOUS | Status: AC
  Filled 2022-05-07: qty 20

## 2022-05-07 MED ORDER — DEXAMETHASONE SODIUM PHOSPHATE 10 MG/ML IJ SOLN
INTRAMUSCULAR | Status: AC
  Filled 2022-05-07: qty 1

## 2022-05-07 MED ORDER — LABETALOL HCL 5 MG/ML IV SOLN
10.0000 mg | Freq: Once | INTRAVENOUS | Status: AC
  Administered 2022-05-07: 10 mg via INTRAVENOUS

## 2022-05-07 MED ORDER — VANCOMYCIN HCL 500 MG IV SOLR
INTRAVENOUS | Status: AC
  Filled 2022-05-07: qty 10

## 2022-05-07 MED ORDER — FENTANYL CITRATE (PF) 250 MCG/5ML IJ SOLN
INTRAMUSCULAR | Status: AC
  Filled 2022-05-07: qty 5

## 2022-05-07 MED ORDER — VANCOMYCIN HCL 500 MG IV SOLR
INTRAVENOUS | Status: DC | PRN
  Administered 2022-05-07: 500 mg via TOPICAL

## 2022-05-07 MED ORDER — FENTANYL CITRATE (PF) 250 MCG/5ML IJ SOLN
INTRAMUSCULAR | Status: DC | PRN
  Administered 2022-05-07: 50 ug via INTRAVENOUS
  Administered 2022-05-07: 75 ug via INTRAVENOUS
  Administered 2022-05-07: 50 ug via INTRAVENOUS
  Administered 2022-05-07: 25 ug via INTRAVENOUS
  Administered 2022-05-07: 50 ug via INTRAVENOUS

## 2022-05-07 MED ORDER — SODIUM CHLORIDE 0.9 % IR SOLN
Status: DC | PRN
  Administered 2022-05-07: 1000 mL

## 2022-05-07 MED ORDER — OXYCODONE HCL 5 MG PO TABS: 5.0000 mg | ORAL_TABLET | Freq: Once | ORAL | Status: DC | PRN

## 2022-05-07 MED ORDER — LABETALOL HCL 5 MG/ML IV SOLN
INTRAVENOUS | Status: AC
  Filled 2022-05-07: qty 4

## 2022-05-07 MED ORDER — FENTANYL CITRATE (PF) 100 MCG/2ML IJ SOLN: 25.0000 ug | INTRAMUSCULAR | Status: DC | PRN

## 2022-05-07 MED ORDER — MIDAZOLAM HCL 2 MG/2ML IJ SOLN
INTRAMUSCULAR | Status: AC
  Administered 2022-05-07: 2 mg via INTRAVENOUS
  Filled 2022-05-07: qty 2

## 2022-05-07 MED ORDER — ACETAMINOPHEN 160 MG/5ML PO SOLN: 325.0000 mg | ORAL | Status: DC | PRN

## 2022-05-07 MED ORDER — MEPERIDINE HCL 25 MG/ML IJ SOLN: 6.2500 mg | INTRAMUSCULAR | Status: DC | PRN

## 2022-05-07 MED ORDER — BUPIVACAINE LIPOSOME 1.3 % IJ SUSP
INTRAMUSCULAR | Status: DC | PRN
  Administered 2022-05-07: 10 mL via PERINEURAL

## 2022-05-07 MED ORDER — ONDANSETRON HCL 4 MG/2ML IJ SOLN: 4.0000 mg | Freq: Once | INTRAMUSCULAR | Status: DC | PRN

## 2022-05-07 MED ORDER — LIDOCAINE 2% (20 MG/ML) 5 ML SYRINGE
INTRAMUSCULAR | Status: DC | PRN
  Administered 2022-05-07: 100 mg via INTRAVENOUS

## 2022-05-07 MED ORDER — MIDAZOLAM HCL 2 MG/2ML IJ SOLN: 2.0000 mg | Freq: Once | INTRAMUSCULAR | Status: AC

## 2022-05-07 MED ORDER — EPHEDRINE SULFATE-NACL 50-0.9 MG/10ML-% IV SOSY
PREFILLED_SYRINGE | INTRAVENOUS | Status: DC | PRN
  Administered 2022-05-07 (×2): 5 mg via INTRAVENOUS

## 2022-05-07 MED ORDER — HYDRALAZINE HCL 20 MG/ML IJ SOLN
INTRAMUSCULAR | Status: AC
  Administered 2022-05-07: 5 mg
  Filled 2022-05-07: qty 1

## 2022-05-07 MED ORDER — FENTANYL CITRATE (PF) 100 MCG/2ML IJ SOLN: 100.0000 ug | Freq: Once | INTRAMUSCULAR | Status: AC

## 2022-05-07 MED ORDER — HYDRALAZINE HCL 20 MG/ML IJ SOLN: 5.0000 mg | Freq: Once | INTRAMUSCULAR | Status: AC

## 2022-05-07 SURGICAL SUPPLY — 85 items
ANCHOR SUT KEITH ABD SZ2 STR (SUTURE) ×2 IMPLANT
BANDAGE ESMARK 6X9 LF (GAUZE/BANDAGES/DRESSINGS) ×2 IMPLANT
BIT DRILL 2.0 LONG 140 (BIT) IMPLANT
BIT DRILL 2.5X2.75 QC CALB (BIT) IMPLANT
BIT DRILL 2.9 CANN QC NONSTRL (BIT) IMPLANT
BIT DRILL CALIBRATED 2.7 (BIT) IMPLANT
BIT DRILL SOLID 2.0 X 110MM (DRILL) IMPLANT
BLADE SURG 15 STRL LF DISP TIS (BLADE) ×4 IMPLANT
BLADE SURG 15 STRL SS (BLADE) ×2
BNDG COHESIVE 4X5 TAN STRL LF (GAUZE/BANDAGES/DRESSINGS) ×2 IMPLANT
BNDG COHESIVE 6X5 TAN NS LF (GAUZE/BANDAGES/DRESSINGS) ×2 IMPLANT
BNDG COHESIVE 6X5 TAN ST LF (GAUZE/BANDAGES/DRESSINGS) ×2 IMPLANT
BNDG ELASTIC 4X5.8 VLCR STR LF (GAUZE/BANDAGES/DRESSINGS) ×2 IMPLANT
BNDG ELASTIC 6X10 VLCR STRL LF (GAUZE/BANDAGES/DRESSINGS) IMPLANT
BNDG ELASTIC 6X5.8 VLCR STR LF (GAUZE/BANDAGES/DRESSINGS) ×2 IMPLANT
BNDG ESMARK 6X9 LF (GAUZE/BANDAGES/DRESSINGS) ×2
BNDG GAUZE DERMACEA FLUFF 4 (GAUZE/BANDAGES/DRESSINGS) ×2 IMPLANT
CHLORAPREP W/TINT 26 (MISCELLANEOUS) ×4 IMPLANT
COVER SURGICAL LIGHT HANDLE (MISCELLANEOUS) ×2 IMPLANT
CUFF TOURN SGL QUICK 34 (TOURNIQUET CUFF) ×2
CUFF TRNQT CYL 34X4.125X (TOURNIQUET CUFF) ×2 IMPLANT
DRAPE C-ARM 42X120 X-RAY (DRAPES) IMPLANT
DRAPE C-ARMOR (DRAPES) ×2 IMPLANT
DRAPE EXTREMITY T 121X128X90 (DISPOSABLE) ×2 IMPLANT
DRAPE U-SHAPE 47X51 STRL (DRAPES) ×2 IMPLANT
DRILL SOLID 2.0 X 110MM (DRILL) ×2
DRSG ADAPTIC 3X8 NADH LF (GAUZE/BANDAGES/DRESSINGS) ×2 IMPLANT
DRSG MEPITEL 8X12 (GAUZE/BANDAGES/DRESSINGS) IMPLANT
ELECT REM PT RETURN 15FT ADLT (MISCELLANEOUS) ×2 IMPLANT
GAUZE PAD ABD 8X10 STRL (GAUZE/BANDAGES/DRESSINGS) ×10 IMPLANT
GAUZE SPONGE 4X4 12PLY STRL (GAUZE/BANDAGES/DRESSINGS) ×4 IMPLANT
GAUZE SPONGE 4X4 16PLY XRAY LF (GAUZE/BANDAGES/DRESSINGS) IMPLANT
GAUZE XEROFORM 5X9 LF (GAUZE/BANDAGES/DRESSINGS) ×2 IMPLANT
GLOVE SURG ENC MOIS LTX SZ7.5 (GLOVE) ×4 IMPLANT
GLOVE SURG MICRO LTX SZ7.5 (GLOVE) ×4 IMPLANT
GLOVE SURG POLYISO LF SZ7.5 (GLOVE) ×2 IMPLANT
GLOVE SURG UNDER POLY LF SZ7.5 (GLOVE) ×4 IMPLANT
GOWN STRL REUS W/ TWL LRG LVL3 (GOWN DISPOSABLE) ×2 IMPLANT
GOWN STRL REUS W/TWL LRG LVL3 (GOWN DISPOSABLE) ×2
K-WIRE ACE 1.6X6 (WIRE) ×10
KIT BASIN OR (CUSTOM PROCEDURE TRAY) ×2 IMPLANT
KIT TURNOVER KIT A (KITS) IMPLANT
KWIRE ACE 1.6X6 (WIRE) IMPLANT
NDL MAYO CATGUT SZ4 TPR NDL (NEEDLE) IMPLANT
NEEDLE HYPO 22GX1.5 SAFETY (NEEDLE) ×2 IMPLANT
NEEDLE MAYO CATGUT SZ4 (NEEDLE) IMPLANT
NS IRRIG 1000ML POUR BTL (IV SOLUTION) ×2 IMPLANT
PACK ORTHO EXTREMITY (CUSTOM PROCEDURE TRAY) ×2 IMPLANT
PAD ABD 8X10 STRL (GAUZE/BANDAGES/DRESSINGS) IMPLANT
PAD CAST 4YDX4 CTTN HI CHSV (CAST SUPPLIES) ×12 IMPLANT
PADDING CAST COTTON 4X4 STRL (CAST SUPPLIES)
PADDING CAST COTTON 6X4 STRL (CAST SUPPLIES) ×2 IMPLANT
PLATE LOCK 8H 103 BILAT FIB (Plate) IMPLANT
PLATE MEDIAL MALLEOLUS 4H HOOK (Plate) IMPLANT
SCREW ACE CAN 4.0 40M (Screw) IMPLANT
SCREW CORT LP 3.5X12 (Screw) IMPLANT
SCREW LOCK ALPS 3.5X10 (Screw) IMPLANT
SCREW LOCK ALPS 3.5X12 (Screw) IMPLANT
SCREW LOCK CORT STAR 3.5X10 (Screw) IMPLANT
SCREW LOCK CORT STAR 3.5X40 (Screw) IMPLANT
SCREW LOCK CORT STAR 3.5X42 (Screw) IMPLANT
SCREW LOCK MD 3.5X14 NS (Screw) IMPLANT
SCREW LOCK PLATE R3 3.5X28 (Screw) IMPLANT
SCREW LOCK PLATE R3 3.5X30 (Screw) IMPLANT
SCREW RE3CON NL 3.5X30 (Screw) IMPLANT
SCREW T15 LP CORT 3.5X10MM (Screw) IMPLANT
SPIKE FLUID TRANSFER (MISCELLANEOUS) IMPLANT
SPONGE T-LAP 18X18 ~~LOC~~+RFID (SPONGE) ×2 IMPLANT
STOCKINETTE 6  STRL (DRAPES) ×2
STOCKINETTE 6 STRL (DRAPES) IMPLANT
STOCKINETTE TUBULAR 6 INCH (GAUZE/BANDAGES/DRESSINGS) ×2 IMPLANT
STOCKINETTE TUBULAR COTT 4X25 (GAUZE/BANDAGES/DRESSINGS) ×2 IMPLANT
STRIP CLOSURE SKIN 1/2X4 (GAUZE/BANDAGES/DRESSINGS) ×2 IMPLANT
SUCTION FRAZIER HANDLE 10FR (MISCELLANEOUS) ×2
SUCTION TUBE FRAZIER 10FR DISP (MISCELLANEOUS) ×2 IMPLANT
SUT ETHILON 2 0 FS 18 (SUTURE) IMPLANT
SUT ETHILON 3 0 PS 1 (SUTURE) ×2 IMPLANT
SUT MON AB 3-0 SH 27 (SUTURE) ×2
SUT MON AB 3-0 SH27 (SUTURE) ×2 IMPLANT
SUT VIC AB 2-0 SH 27 (SUTURE) ×2
SUT VIC AB 2-0 SH 27XBRD (SUTURE) ×2 IMPLANT
SUT VIC AB 3-0 SH 27 (SUTURE)
SUT VIC AB 3-0 SH 27X BRD (SUTURE) ×4 IMPLANT
SYR CONTROL 10ML LL (SYRINGE) ×2 IMPLANT
WATER STERILE IRR 1000ML POUR (IV SOLUTION) ×2 IMPLANT

## 2022-05-07 NOTE — Progress Notes (Signed)
Notified Dr. Glennon Mac of 179/107 BP. Labetalol '10mg'$  ordered and given.

## 2022-05-07 NOTE — Anesthesia Procedure Notes (Signed)
Procedure Name: LMA Insertion Date/Time: 05/07/2022 4:39 PM  Performed by: Janene Harvey, CRNAPre-anesthesia Checklist: Patient identified, Emergency Drugs available, Suction available and Patient being monitored Patient Re-evaluated:Patient Re-evaluated prior to induction Oxygen Delivery Method: Circle system utilized Preoxygenation: Pre-oxygenation with 100% oxygen Induction Type: IV induction Ventilation: Mask ventilation without difficulty LMA: LMA inserted LMA Size: 3.0 Placement Confirmation: positive ETCO2 Tube secured with: Tape Dental Injury: Teeth and Oropharynx as per pre-operative assessment

## 2022-05-07 NOTE — Transfer of Care (Signed)
Immediate Anesthesia Transfer of Care Note  Patient: Evangela Hinks  Procedure(s) Performed: REMOVAL EXTERNAL FIXATION LEG right (Right: Leg Lower) OPEN REDUCTION INTERNAL FIXATION (ORIF)right  ANKLE FRACTURE possible syndesmosis and/or deltoid fixation, possible allograft (Right: Ankle)  Patient Location: PACU  Anesthesia Type:GA combined with regional for post-op pain  Level of Consciousness: drowsy  Airway & Oxygen Therapy: Patient Spontanous Breathing  Post-op Assessment: Report given to RN and Post -op Vital signs reviewed and stable  Post vital signs: Reviewed and stable  Last Vitals:  Vitals Value Taken Time  BP 146/89 05/07/22 2008  Temp    Pulse 71 05/07/22 2012  Resp 24 05/07/22 2012  SpO2 93 % 05/07/22 2012  Vitals shown include unvalidated device data.  Last Pain:  Vitals:   05/07/22 1319  TempSrc:   PainSc: 0-No pain         Complications: No notable events documented.

## 2022-05-07 NOTE — Progress Notes (Signed)
BP 133/111, notified Dr. Glennon Mac. Orders for Hydralazine '5mg'$  ordered. Post med administration BP check 172/99, pulse 65

## 2022-05-07 NOTE — Anesthesia Procedure Notes (Signed)
Anesthesia Regional Block: Adductor canal block   Pre-Anesthetic Checklist: , timeout performed,  Correct Patient, Correct Site, Correct Laterality,  Correct Procedure, Correct Position, site marked,  Risks and benefits discussed,  Surgical consent,  Pre-op evaluation,  At surgeon's request and post-op pain management  Laterality: Right  Prep: chloraprep       Needles:  Injection technique: Single-shot  Needle Type: Echogenic Stimulator Needle     Needle Length: 5cm  Needle Gauge: 22     Additional Needles:   Procedures:,,,, ultrasound used (permanent image in chart),,    Narrative:  Start time: 05/07/2022 3:50 PM End time: 05/07/2022 3:58 PM Injection made incrementally with aspirations every 5 mL.  Performed by: Personally  Anesthesiologist: Janeece Riggers, MD  Additional Notes: Functioning IV was confirmed and monitors were applied.  A 58m 22ga Arrow echogenic stimulator needle was used. Sterile prep and drape,hand hygiene and sterile gloves were used. Ultrasound guidance: relevant anatomy identified, needle position confirmed, local anesthetic spread visualized around nerve(s)., vascular puncture avoided.  Image printed for medical record. Negative aspiration and negative test dose prior to incremental administration of local anesthetic. The patient tolerated the procedure well.

## 2022-05-07 NOTE — Anesthesia Preprocedure Evaluation (Addendum)
Anesthesia Evaluation  Patient identified by MRN, date of birth, ID band Patient awake    Reviewed: Allergy & Precautions, NPO status , Patient's Chart, lab work & pertinent test results  Airway Mallampati: II  TM Distance: >3 FB Neck ROM: Full    Dental no notable dental hx. (+) Dental Advisory Given, Edentulous Upper, Edentulous Lower   Pulmonary neg pulmonary ROS, former smoker,    Pulmonary exam normal breath sounds clear to auscultation       Cardiovascular hypertension, Pt. on medications and Pt. on home beta blockers (-) CAD negative cardio ROS Normal cardiovascular exam+ Valvular Problems/Murmurs (s/p AVR)  Rhythm:Regular Rate:Normal  '21 ECHO: EF 60-65%. The LV has normal function, no regional wall motion abnormalities, mod LVH, Grade II diastolic dysfunction (pseudonormalization). Elevated left atrial pressure.  2. RVF is mildly reduced. RV size is normal. There is normal pulmonary artery systolic pressure.  3. Left atrial size was severely dilated.  4. The mitral valve has been repaired/replaced. There is a 28 mm prosthetic annuloplasty ring present in the mitral position. Mild to moderate mitral valve regurgitation. Mild mitral stenosis. MG 4 mmHg at HR 62 bpm, MVA 1.9 cm^2 by continuity equation.  5. The aortic valve has been repaired/replaced. There is a 23 mm bioprosthetic valve present in the aortic position. AI is not visualized. Echo findings are consistent with normal structure and function of the AV prosthesis   Neuro/Psych PSYCHIATRIC DISORDERS Anxiety Depression negative neurological ROS     GI/Hepatic negative GI ROS, Neg liver ROS,   Endo/Other  negative endocrine ROSHypothyroidism   Renal/GU Renal disease  negative genitourinary   Musculoskeletal negative musculoskeletal ROS (+)   Abdominal   Peds negative pediatric ROS (+)  Hematology   Anesthesia Other Findings    Reproductive/Obstetrics negative OB ROS                           Anesthesia Physical Anesthesia Plan  ASA: 3  Anesthesia Plan: General   Post-op Pain Management: Tylenol PO (pre-op)* and Regional block*   Induction: Intravenous  PONV Risk Score and Plan: 3 and Ondansetron, Dexamethasone and Scopolamine patch - Pre-op  Airway Management Planned: Oral ETT  Additional Equipment: None  Intra-op Plan:   Post-operative Plan: Extubation in OR  Informed Consent:   Plan Discussed with: Anesthesiologist and CRNA  Anesthesia Plan Comments: (Plan routine monitors, GA with adductor canal and popliteal blocks for post op analgesia)       Anesthesia Quick Evaluation

## 2022-05-07 NOTE — Anesthesia Postprocedure Evaluation (Signed)
Anesthesia Post Note  Patient: Annye Heenan  Procedure(s) Performed: REMOVAL EXTERNAL FIXATION LEG right (Right: Leg Lower) OPEN REDUCTION INTERNAL FIXATION (ORIF)right  ANKLE FRACTURE possible syndesmosis and/or deltoid fixation, possible allograft (Right: Ankle)     Patient location during evaluation: PACU Anesthesia Type: General Level of consciousness: awake and alert Pain management: pain level controlled Vital Signs Assessment: post-procedure vital signs reviewed and stable Respiratory status: spontaneous breathing, nonlabored ventilation, respiratory function stable and patient connected to nasal cannula oxygen Cardiovascular status: blood pressure returned to baseline and stable Postop Assessment: no apparent nausea or vomiting Anesthetic complications: no   No notable events documented.  Last Vitals:  Vitals:   05/07/22 2030 05/07/22 2040  BP: (!) 145/79 (!) 144/87  Pulse: 72 74  Resp: 20 (!) 22  Temp:  36.8 C  SpO2: 98% 97%    Last Pain:  Vitals:   05/07/22 2040  TempSrc:   PainSc: 0-No pain                 Effie Berkshire

## 2022-05-07 NOTE — Op Note (Addendum)
05/07/2022  10:00 PM   PATIENT: Miranda [name]  56 y.o. female  MRN: [id number]   PRE-OPERATIVE DIAGNOSIS:   Displaced trimalleolar fracture of right lower leg   POST-OPERATIVE DIAGNOSIS:   Same   PROCEDURE: Open reduction and internal fixation of the right trimalleolar ankle fracture  Removal of right ankle spanning external fixation Open reduction and internal fixation of the right ankle syndesmosis  (This is the second part of a staged procedure due to need for delayed definitive fixation given extensive soft tissue injuries)   SURGEON:  Armond Hang, MD   ASSISTANT: None   ANESTHESIA: General, regional   EBL: Minimal   TOURNIQUET:    Total Tourniquet Time Documented: Thigh (Right) - 160 minutes Total: Thigh (Right) - 809 minutes    COMPLICATIONS: None apparent   DISPOSITION: Extubated, awake and stable to recovery.   INDICATION FOR PROCEDURE: The patient presented with right trimalleolar ankle fracture sustained 9 days ago on 04/15/22 when she stepped in a hole while fishing. She was closed reduced in the ED and splinted. She followed up as outpatient. CT noted persistent dislocation of the ankle and clinical exam demonstrated circumferential serous and serosanguinous blistering around the ankle. We discussed doing a staged external fixation with delayed definitive fixation in light of her extensive soft tissue injury. She underwent this on 04/24/22 without any issues. On follow up, she was noted to have excellent resolution of the soft tissue injury and was deemed to be ready for definitive fixation.   We discussed the diagnosis, alternative treatment options, risks and benefits of the above surgical intervention, as well as alternative non-operative treatments. All questions/concerns were addressed and the patient/family demonstrated appropriate understanding of the diagnosis, the procedure, the postoperative course, and overall prognosis. The patient  wished to proceed with surgical intervention and signed an informed surgical consent as such, in each others presence prior to surgery.   PROCEDURE IN DETAIL: After preoperative consent was obtained and the correct operative site was identified, the patient was brought to the operating room supine on stretcher and transferred onto operating table. General anesthesia was induced. Preoperative antibiotics were administered. Surgical timeout was taken. The patient was then positioned supine with an ipsilateral hip bump. Based on intraoperative clinical examination, it was determined that the soft tissues were ready to proceed with definitive internal fixation of the right ankle as planned.  1) Removal of the right ankle external fixation  We began by loosening the clamps holding the ankle spanning bars. The bars and the clamps were thus removed. We then used a T-handle tool to gently remove both of the tibia half-pins. We then similarly removed the calcaneus pin under sterile precautions. The pin sites were noted to be healthy appearing with minimal drainage. We obtained intraoperative fluoroscopy both before and after the case to ensure no stress fracture at pin sites.   The operative lower extremity was prepped and draped in standard sterile fashion with a tourniquet around the thigh. The extremity was exsanguinated and the tourniquet was inflated to 275 mmHg.  2) Open reduction internal fixation of the right ankle  A standard lateral incision was made over the distal fibula. Dissection was carried down to the level of the fibula and the fracture site identified. The superficial peroneal nerve was identified and protected throughout the procedure. The fibula was noted to be shortened with interposed periosteum. The posterior malleolus fragment was noted to be severely comminuted. The articular fragments were reduced as accessed thru the fibular fracture.  The ankle joint surface was noted to be  concentric and stable. The fibula was brought out to length. The fibula fracture was debrided and the edges defined to achieve cortical read. Reduction maneuver was performed using pointed reduction forceps and lobster forceps. In this manner, the fibula length was restored and fracture reduced. A lag screw was not placed given the orientation of fracture lines and comminution. Due to poor bone quality and extensive comminution at the fracture site, it was decided to use a locking distal fibula plate. We then selected a Zimmer locking plate to match the anatomy of the distal fibula and placed it laterally. This was implanted under intraoperative fluoroscopy with a combination of distal locking screws and proximal cortical & locking screws.  We then stabilized the major fragment of the posterior malleolus using an anterior-posterior directed screw. We made a limited anterolateral approach taking care to protect nerves, vessels and tendons. Dissection was carried down to bone and a Kirschner wire was used to provisionally pin the posterior fragment. This was overdrilled and a single 4.0 partially threaded cannulated screw was placed with excellent capture of the posterior fragment.  We then made a direct medial ankle approach and extended this proximally in anticipation of implanting a hook plate. Dissection was carried down to the level of the medial malleolar fragment. A dental pick and freer elevator were used to reduce the medial malleolar fragment. Of note, there was extensive comminution of this fragment into multiple segments, all of which had very poor bone quality. A Paragon28 medial distal tibia hook plate was utilized to fix the reduced medial malleolar fragment and the tines were carefully inserted into the distal tip of the malleolus. The plate was oriented to best capture the major fragments of the medial comminution. We placed a locking screw in the hook plate and subsequently implanted another  locking screw proximally to further secure the plate.  A manual external rotation stress radiograph was obtained and demonstrated widening of the ankle mortise. Given this intraoperative finding as well as preoperative subluxation, it was decided to reduce and fix the syndesmosis. Therefore a 3.5 tricortical screw was implanted through the fibula plate in appropriate orientation to fix the syndesmosis. Screw position was verified along anteromedial tibial cortex by fluoroscopy. This sequence was repeated once again with a second syndesmotic screw. A repeat stress radiograph showed complete stability of the ankle mortise to testing.  The surgical sites and external fixation pin sites were thoroughly irrigated with saline, betadine and vancomycin powder was instilled. The tourniquet was deflated and hemostasis achieved. The subcutaneous tissues were closed using 2-0 vicryl suture. The skin was closed without tension using 2-0 nylon suture. The external fixation pin sites were curretted thoroughly prior to their irrigation. These pin site wounds were loosely approximated with 2-0 nylon suture.    The leg was cleaned with saline and sterile adaptic dressings with gauze were applied. A well padded bulky short leg splint was applied. The patient was awakened from anesthesia and transported to the recovery room in stable condition.    FOLLOW UP PLAN: -transfer to PACU, then home -strict NWB operative extremity, maximum elevation -maintain short leg splint until follow up -DVT ppx: Aspirin 81 mg twice daily while NWB -follow up as outpatient in 7-10 days for wound check -sutures out in 2-3 weeks with exchange of short leg splint to short leg cast in outpatient office   RADIOGRAPHS: 1) Right Ankle AP, lateral, oblique and stress radiographs of the right ankle  were obtained intraoperatively. These showed interval reduction and fixation of the fractures. Manual stress radiographs were taken and the ankle  mortise and tibiofibular relationship were noted to be stable following fixation. All hardware is appropriately positioned and of the appropriate lengths. No other acute injuries are noted.  2) Right Tibia AP and lateral radiographs of the right tibia were obtained intraoperatively. These showed interval removal of the external fixation pins. No other acute injuries are noted.  3) Right Foot AP, oblique and lateral radiographs of the right foot were obtained intraoperatively. These showed interval removal of the external fixation transcalcaneal pin. No other acute injuries are noted.  Armond Hang Orthopa[facility] Surgery [facility]

## 2022-05-07 NOTE — Anesthesia Procedure Notes (Signed)
Anesthesia Regional Block: Popliteal block   Pre-Anesthetic Checklist: , timeout performed,  Correct Patient, Correct Site, Correct Laterality,  Correct Procedure, Correct Position, site marked,  Risks and benefits discussed,  Surgical consent,  Pre-op evaluation,  At surgeon's request and post-op pain management  Laterality: Right  Prep: chloraprep       Needles:  Injection technique: Single-shot  Needle Type: Echogenic Stimulator Needle     Needle Length: 5cm  Needle Gauge: 22     Additional Needles:   Procedures:, nerve stimulator,,, ultrasound used (permanent image in chart),,     Nerve Stimulator or Paresthesia:  Response: foot, 0.45 mA  Additional Responses:   Narrative:  Start time: 05/07/2022 3:45 PM End time: 05/07/2022 3:50 PM Injection made incrementally with aspirations every 5 mL.  Performed by: Personally  Anesthesiologist: Janeece Riggers, MD  Additional Notes: Functioning IV was confirmed and monitors were applied.  A 15m 22ga Arrow echogenic stimulator needle was used. Sterile prep and drape,hand hygiene and sterile gloves were used. Ultrasound guidance: relevant anatomy identified, needle position confirmed, local anesthetic spread visualized around nerve(s)., vascular puncture avoided.  Image printed for medical record. Negative aspiration and negative test dose prior to incremental administration of local anesthetic. The patient tolerated the procedure well.

## 2022-05-07 NOTE — H&P (Signed)
H&P Update:  -History and Physical Reviewed  -Patient has been re-examined  -No change in the plan of care  -The risks and benefits were presented and reviewed. The risks due to hardware failure/irritation, new/persistent infection, stiffness, nerve/vessel/tendon injury, nonunion/malunion, wound healing issues, development of arthritis, failure of this surgery, possibility of partial or complete maintenance of external fixation with delayed definitive surgery, need for further surgery, thromboembolic events, anesthesia/medical complications, amputation, death among others were discussed. The patient acknowledged the explanation, agreed to proceed with the plan and a consent was signed.  Armond Hang

## 2022-05-10 ENCOUNTER — Encounter (HOSPITAL_COMMUNITY): Payer: Self-pay | Admitting: Orthopaedic Surgery

## 2022-05-12 ENCOUNTER — Encounter (HOSPITAL_COMMUNITY): Payer: Self-pay | Admitting: Orthopaedic Surgery

## 2022-05-13 DIAGNOSIS — S82851A Displaced trimalleolar fracture of right lower leg, initial encounter for closed fracture: Secondary | ICD-10-CM | POA: Diagnosis not present

## 2022-05-17 ENCOUNTER — Other Ambulatory Visit: Payer: Self-pay | Admitting: Nurse Practitioner

## 2022-05-17 DIAGNOSIS — E782 Mixed hyperlipidemia: Secondary | ICD-10-CM

## 2022-05-19 ENCOUNTER — Encounter: Payer: Self-pay | Admitting: Nurse Practitioner

## 2022-05-19 ENCOUNTER — Ambulatory Visit (INDEPENDENT_AMBULATORY_CARE_PROVIDER_SITE_OTHER): Payer: HMO | Admitting: Nurse Practitioner

## 2022-05-19 VITALS — BP 136/82

## 2022-05-19 DIAGNOSIS — I5022 Chronic systolic (congestive) heart failure: Secondary | ICD-10-CM

## 2022-05-19 DIAGNOSIS — I428 Other cardiomyopathies: Secondary | ICD-10-CM | POA: Diagnosis not present

## 2022-05-19 DIAGNOSIS — E039 Hypothyroidism, unspecified: Secondary | ICD-10-CM | POA: Diagnosis not present

## 2022-05-19 DIAGNOSIS — F3341 Major depressive disorder, recurrent, in partial remission: Secondary | ICD-10-CM

## 2022-05-19 DIAGNOSIS — S82891D Other fracture of right lower leg, subsequent encounter for closed fracture with routine healing: Secondary | ICD-10-CM

## 2022-05-19 DIAGNOSIS — E782 Mixed hyperlipidemia: Secondary | ICD-10-CM

## 2022-05-19 DIAGNOSIS — R61 Generalized hyperhidrosis: Secondary | ICD-10-CM | POA: Diagnosis not present

## 2022-05-19 DIAGNOSIS — I10 Essential (primary) hypertension: Secondary | ICD-10-CM

## 2022-05-19 MED ORDER — BUPROPION HCL ER (XL) 150 MG PO TB24: 150.0000 mg | ORAL_TABLET | Freq: Every day | ORAL | 1 refills | Status: DC

## 2022-05-19 NOTE — Progress Notes (Signed)
Careteam: Patient Care Team: Lauree Chandler, NP as PCP - General (Geriatric Medicine) Jerline Pain, MD as PCP - Cardiology (Cardiology) Emily Filbert, MD as Consulting Physician (Obstetrics and Gynecology) Haroldine Laws, Shaune Pascal, MD as Consulting Physician (Cardiology) Leta Baptist, Earlean Polka, MD as Consulting Physician (Neurology)  PLACE OF SERVICE:  [place] Directive information    Allergies  Allergen Reactions   Codeine Hives    Chief Complaint  Patient presents with   Medical Management of Chronic Issues    Patient presents today for a 7 month follow-up     HPI: Patient is a 56 y.o. female for follow up.   Depression: wants to restart Wellbutrin. Previously stopped d/t excessive sweating but symptoms persisted even in the absence and mood worsened. Remains on remeron. Since she is immobile after her ankle fracture and surgery her depression has been worse.   Recently had ORIF surgery for right ankle fracture is in a cast and NWB until December. Using wheelchair to get around when outside of the house and crutches when inside of the house. States pain is well controlled on the oxycodone and tylenol. She is taking baby aspirin twice a day.   HTN: remains on lisinopril  CHF: remains on lasix and coreg. Follows with cardiology. No shortness of breath, chest pains or LE edema   Insomnia: sleeping well, remains on trazadone.     Review of Systems:  Review of Systems  Constitutional:  Negative for fever and weight loss.  HENT:  Negative for ear pain and hearing loss.   Eyes:  Negative for blurred vision.  Respiratory:  Negative for cough and shortness of breath.   Cardiovascular:  Negative for chest pain, palpitations and leg swelling.  Gastrointestinal:  Negative for abdominal pain, constipation, diarrhea and heartburn.  Genitourinary:  Negative for dysuria and hematuria.  Musculoskeletal:  Positive for joint pain. Negative for myalgias.  Neurological:   Negative for dizziness, tingling and headaches.  Psychiatric/Behavioral:  Positive for depression. The patient is not nervous/anxious.     Past Medical History:  Diagnosis Date   Anemia    Anxiety    At risk for sudden cardiac death 11/28/2012   CHF - Combined Systolic + Diastolic Dysfunction. EF of 15-20% with Grade II diastolic dysfunction on echo 10/27/12 10/28/2012   CKD (chronic kidney disease)    Coronary artery disease    Depression    .   Dyspnea    none per pt on 04/23/22   Fibroids    Heart murmur    History of blood transfusion 2014   Hypertension    Hypothyroidism    S/P cardiac catheterization, Nov 28, 2012, normal coronaries with minimal luminal irregularities in RCA system 10/30/2012   Thyroid disease    hypothyroidism   Past Surgical History:  Procedure Laterality Date   AORTIC VALVE REPLACEMENT N/A 03/12/2017   Procedure: AORTIC VALVE REPLACEMENT (AVR) with Mitral Annuloplasty;  Surgeon: Gaye Pollack, MD;  Location: [facility];  Service: Open Heart Surgery;  Laterality: N/A;   EXTERNAL FIXATION LEG Right 04/24/2022   Procedure: EXTERNAL FIXATION RIGHT LEG;  Surgeon: Armond Hang, MD;  Location: [facility];  Service: Orthopedics;  Laterality: Right;   EXTERNAL FIXATION REMOVAL Right 05/07/2022   Procedure: REMOVAL EXTERNAL FIXATION LEG right;  Surgeon: Armond Hang, MD;  Location: [place];  Service: Orthopedics;  Laterality: Right;  120 min   LEFT AND RIGHT HEART CATHETERIZATION WITH CORONARY ANGIOGRAM N/A 2012/11/28   Procedure: LEFT AND  RIGHT HEART CATHETERIZATION WITH CORONARY ANGIOGRAM;  Surgeon: Leonie Man, MD;  Location: [facility] CATH LAB;  Service: Cardiovascular;  Laterality: N/A;   MITRAL VALVE REPAIR N/A 03/12/2017   Procedure: MITRAL VALVE REPAIR (MVR);  Surgeon: Gaye Pollack, MD;  Location: [place];  Service: Open Heart Surgery;  Laterality: N/A;  mitral annuloplasty   MULTIPLE EXTRACTIONS WITH ALVEOLOPLASTY N/A 01/21/2017   Procedure: Extraction of tooth #'s  4-13 with alveoloplasty;  Surgeon: Lenn Cal, DDS;  Location: [place];  Service: Oral Surgery;  Laterality: N/A;   ORIF ANKLE FRACTURE Right 05/07/2022   Procedure: OPEN REDUCTION INTERNAL FIXATION (ORIF)right  ANKLE FRACTURE possible syndesmosis and/or deltoid fixation, possible allograft;  Surgeon: Armond Hang, MD;  Location: [place];  Service: Orthopedics;  Laterality: Right;  120 min   RIGHT/LEFT HEART CATH AND CORONARY ANGIOGRAPHY N/A 01/09/2017   Procedure: Right/Left Heart Cath and Coronary Angiography;  Surgeon: Troy Sine, MD;  Location: [place] CV LAB;  Service: Cardiovascular;  Laterality: N/A;   TEE WITHOUT CARDIOVERSION N/A 03/12/2017   Procedure: TRANSESOPHAGEAL ECHOCARDIOGRAM (TEE);  Surgeon: Gaye Pollack, MD;  Location: [place];  Service: Open Heart Surgery;  Laterality: N/A;   Social History:   reports that she quit smoking about 43 years ago. Her smoking use included cigars and cigarettes. She has a 2.50 pack-year smoking history. She has never used smokeless tobacco. She reports current alcohol use of about 5.0 standard drinks of alcohol per week. She reports that she does not currently use drugs after having used the following drugs: Marijuana. Frequency: 5.00 times per week.  Family History  Problem Relation Age of Onset   Stroke Mother    Hypertension Mother    Liver disease Mother        autoimmune   Heart disease Mother    Colon polyps Mother    Stroke Father    Hypertension Father    Hypertension Maternal Grandmother    Heart disease Maternal Grandmother    Bone cancer Paternal Grandfather    Bone cancer Maternal Uncle    Arthritis Paternal Uncle    Breast cancer Cousin     Medications: Patient's Medications  New Prescriptions   No medications on file  Previous Medications   ACETAMINOPHEN (TYLENOL) 500 MG TABLET    Take 1,000 mg by mouth every 6 (six) hours as needed for mild pain.   ASPIRIN EC 81 MG EC TABLET    Take 1 tablet (81 mg total)  by mouth daily.   CARVEDILOL (COREG) 6.25 MG TABLET    Take 1 tablet (6.25 mg total) by mouth 2 (two) times daily.   CHOLECALCIFEROL (D3) 50 MCG (2000 UT) TABS    Take 2,000 Units by mouth daily.   FERROUS SULFATE 325 (65 FE) MG EC TABLET    Take 1 tablet (325 mg total) by mouth 2 (two) times daily.   FLUTICASONE (FLONASE) 50 MCG/ACT NASAL SPRAY    USE 1 SPRAY(S) IN EACH NOSTRIL ONCE DAILY AS NEEDED FOR ALLERGIES OR RHINITIS   FUROSEMIDE (LASIX) 40 MG TABLET    Take 1 tablet by mouth once daily   LEVOTHYROXINE (SYNTHROID) 100 MCG TABLET    TAKE 1 TABLET BY MOUTH ONCE DAILY BEFORE BREAKFAST   LISINOPRIL (ZESTRIL) 5 MG TABLET    TAKE 1 TABLET BY MOUTH AT BEDTIME   MIRTAZAPINE (REMERON) 15 MG TABLET    TAKE 1 TABLET BY MOUTH ONCE DAILY AT BEDTIME   OXYCODONE-ACETAMINOPHEN (PERCOCET/ROXICET) 5-325 MG TABLET  Take 1 tablet by mouth every 6 (six) hours as needed for severe pain.   ROSUVASTATIN (CRESTOR) 40 MG TABLET    Take 1 tablet (40 mg total) by mouth daily.   TRAZODONE (DESYREL) 150 MG TABLET    TAKE 1 TABLET BY MOUTH ONCE DAILY AT BEDTIME  Modified Medications   No medications on file  Discontinued Medications   No medications on file    Physical Exam:  Vitals:   05/19/22 1011  BP: 136/82   There is no height or weight on file to calculate BMI. Wt Readings from Last 3 Encounters:  05/07/22 64.4 kg  04/24/22 64.4 kg  04/15/22 64.4 kg    Physical Exam Constitutional:      General: She is not in acute distress. Eyes:     Pupils: Pupils are equal, round, and reactive to light.  Cardiovascular:     Rate and Rhythm: Normal rate. Rhythm irregular.     Pulses: Normal pulses.     Heart sounds: No murmur heard. Pulmonary:     Effort: Pulmonary effort is normal. No respiratory distress.     Breath sounds: Normal breath sounds. No wheezing.  Abdominal:     General: Bowel sounds are normal. There is no distension.     Palpations: Abdomen is soft.     Tenderness: There is no  abdominal tenderness.  Musculoskeletal:     Cervical back: Normal range of motion.     Right lower leg: No edema.     Left lower leg: No edema.     Comments: Rt leg hard cast  Skin:    General: Skin is warm and dry.     Capillary Refill: Capillary refill takes less than 2 seconds.  Neurological:     Mental Status: She is alert. Mental status is at baseline.  Psychiatric:        Behavior: Behavior normal.        Thought Content: Thought content normal.     Labs reviewed: Basic Metabolic Panel: Recent Labs    10/25/21 0947 11/12/21 0857 11/15/21 1302 04/21/22 0951  NA 138  --  139 137  K 3.8  --  3.7 4.7  CL 101  --  106 102  CO2 26  --  27 26  GLUCOSE 71  --  78 84  BUN 23  --  14 17  CREATININE 1.13*  --  0.94 0.98  CALCIUM CANCELED  10.9* 10.2 10.1 10.1  TSH 4.49  --   --   --    Liver Function Tests: Recent Labs    10/25/21 0947 11/12/21 0857 11/15/21 1302 04/21/22 0951  AST 26  --  34 21  ALT 17  --  20 8  ALKPHOS  --   --  54  --   BILITOT 0.3  --  0.3 0.5  PROT 9.6* 8.6* 9.4* 9.0*  ALBUMIN  --   --  4.5  --    No results for input(s): "LIPASE", "AMYLASE" in the last 8760 hours. No results for input(s): "AMMONIA" in the last 8760 hours. CBC: Recent Labs    10/25/21 0947 11/15/21 1302 04/21/22 0951  WBC 4.8 4.9 6.2  NEUTROABS 2,722 2.4 3,968  HGB 13.4 11.7* 12.0  HCT 42.1 36.7 37.2  MCV 86.6 87.4 86.9  PLT 148 147* 186   Lipid Panel: No results for input(s): "CHOL", "HDL", "LDLCALC", "TRIG", "CHOLHDL", "LDLDIRECT" in the last 8760 hours. TSH: Recent Labs    10/25/21 0947  TSH 4.49   A1C: Lab Results  Component Value Date   HGBA1C 5.4 06/08/2019     Assessment/Plan 1. Excessive sweating - ongoing, she has had several worksup on this in the past without findings.- previously while on Wellbutrin, trialed without the wellbutrin and sweating has persisted.   2. Recurrent major depressive disorder, in partial remission ([place]) - continue  Remeron, restart Wellbutrin  - buPROPion (WELLBUTRIN XL) 150 MG 24 hr tablet; Take 1 tablet (150 mg total) by mouth daily.  Dispense: 90 tablet; Refill: 1  3. Hypothyroidism, unspecified type - TSH normal  - continue synthroid   4. Cardiomyopathy, nonischemic (HCC) -stable, continues on lisinopril, coreg and lasix.   5. Chronic systolic congestive heart failure ([place]) - Follows with cardiology - continue lisinopril, coreg, lasix, aspirin  6. Primary hypertension -Blood pressure well controlled, goal bp <140/90 Continue current medications and dietary modifications follow metabolic panel  7. Mixed hyperlipidemia - continue crestor with dietary modifications - Lipid panel   8. Right ankle fracture - S/p ORIF surgery, in hard cast, pain controlled  - follows with orthopaedics  Return in about 4 months (around 09/19/2022) for routine follow up .  Student- Waunita Schooner, RN -I personally was present during the history, physical exam and medical decision-making activities of this service and have verified that the service and findings are accurately documented in the student's note Thorn Demas K. [place], [facility] [phone]

## 2022-05-20 LAB — LIPID PANEL
Cholesterol: 151 mg/dL
HDL: 56 mg/dL
LDL Cholesterol (Calc): 78 mg/dL
Non-HDL Cholesterol (Calc): 95 mg/dL
Total CHOL/HDL Ratio: 2.7 (calc)
Triglycerides: 91 mg/dL

## 2022-06-16 ENCOUNTER — Other Ambulatory Visit: Payer: Self-pay | Admitting: Cardiology

## 2022-06-16 DIAGNOSIS — I952 Hypotension due to drugs: Secondary | ICD-10-CM

## 2022-07-15 ENCOUNTER — Other Ambulatory Visit: Payer: Self-pay | Admitting: Nurse Practitioner

## 2022-07-15 DIAGNOSIS — F5104 Psychophysiologic insomnia: Secondary | ICD-10-CM

## 2022-07-15 NOTE — Telephone Encounter (Signed)
Warnings came up when trying to fill medication. Medication pended and sent to Sherrie Mustache, NP

## 2022-08-05 DIAGNOSIS — S82851A Displaced trimalleolar fracture of right lower leg, initial encounter for closed fracture: Secondary | ICD-10-CM | POA: Diagnosis not present

## 2022-08-19 ENCOUNTER — Other Ambulatory Visit: Payer: Self-pay | Admitting: Nurse Practitioner

## 2022-09-01 DIAGNOSIS — Z87891 Personal history of nicotine dependence: Secondary | ICD-10-CM | POA: Diagnosis not present

## 2022-09-01 DIAGNOSIS — I509 Heart failure, unspecified: Secondary | ICD-10-CM | POA: Diagnosis not present

## 2022-09-01 DIAGNOSIS — I110 Hypertensive heart disease with heart failure: Secondary | ICD-10-CM | POA: Diagnosis not present

## 2022-09-09 DIAGNOSIS — S82851A Displaced trimalleolar fracture of right lower leg, initial encounter for closed fracture: Secondary | ICD-10-CM | POA: Diagnosis not present

## 2022-09-15 ENCOUNTER — Other Ambulatory Visit: Payer: Self-pay | Admitting: Cardiology

## 2022-09-15 DIAGNOSIS — I952 Hypotension due to drugs: Secondary | ICD-10-CM

## 2022-09-19 ENCOUNTER — Encounter: Payer: Self-pay | Admitting: Nurse Practitioner

## 2022-09-19 ENCOUNTER — Ambulatory Visit (INDEPENDENT_AMBULATORY_CARE_PROVIDER_SITE_OTHER): Payer: HMO | Admitting: Nurse Practitioner

## 2022-09-19 VITALS — BP 146/98 | HR 72 | Temp 97.4°F | Ht 65.0 in | Wt 141.6 lb

## 2022-09-19 DIAGNOSIS — I10 Essential (primary) hypertension: Secondary | ICD-10-CM

## 2022-09-19 DIAGNOSIS — D500 Iron deficiency anemia secondary to blood loss (chronic): Secondary | ICD-10-CM | POA: Diagnosis not present

## 2022-09-19 DIAGNOSIS — E039 Hypothyroidism, unspecified: Secondary | ICD-10-CM | POA: Diagnosis not present

## 2022-09-19 DIAGNOSIS — Z1211 Encounter for screening for malignant neoplasm of colon: Secondary | ICD-10-CM | POA: Diagnosis not present

## 2022-09-19 DIAGNOSIS — F3341 Major depressive disorder, recurrent, in partial remission: Secondary | ICD-10-CM | POA: Diagnosis not present

## 2022-09-19 DIAGNOSIS — I428 Other cardiomyopathies: Secondary | ICD-10-CM | POA: Diagnosis not present

## 2022-09-19 DIAGNOSIS — I5022 Chronic systolic (congestive) heart failure: Secondary | ICD-10-CM

## 2022-09-19 DIAGNOSIS — Z1212 Encounter for screening for malignant neoplasm of rectum: Secondary | ICD-10-CM

## 2022-09-19 MED ORDER — TRAZODONE HCL 50 MG PO TABS: 50.0000 mg | ORAL_TABLET | Freq: Every day | ORAL | 1 refills | Status: DC

## 2022-09-19 NOTE — Patient Instructions (Addendum)
Decrease remeron to 7.5 mg by mouth at night for 1 week  Then take every other night for the next week then stop.   Once you have stopped remeron to increase trazodone to 200 mg by mouth at bedtime.    Can add melatonin 1 mg by mouth at beditme to help with sleep.    Follow up in 4-6 weeks for mood and PAP

## 2022-09-19 NOTE — Progress Notes (Signed)
Careteam: Patient Care Team: Lauree Chandler, NP as PCP - General (Geriatric Medicine) Jerline Pain, MD as PCP - Cardiology (Cardiology) Emily Filbert, MD as Consulting Physician (Obstetrics and Gynecology) Haroldine Laws, Shaune Pascal, MD as Consulting Physician (Cardiology) Leta Baptist, Earlean Polka, MD as Consulting Physician (Neurology)  PLACE OF SERVICE:  [place] Directive information Does Patient Have a Medical Advance Directive?: No, Would patient like information on creating a medical advance directive?: No - Patient declined  Allergies  Allergen Reactions   Codeine Hives   Wellbutrin [Bupropion]     Excessive sweating     Chief Complaint  Patient presents with   Medical Management of Chronic Issues    4 month follow-up. Discuss need for pap smear, cologuard, and covid boosters, or post pone if patient refuses or is not a candidate. Discuss alternative to wellbutrin.      HPI: Patient is a 57 y.o. female for follow up.   Continues to have pain to right ankle, followed by ortho and wearing brace.   Mood is not good, she stop Wellbutrin and sweats improved but mood was bad. She restarted the wellbutrin but could not tolerate the sweating so she is not taking it correctly and therefore mood is not doing good. She can feel herself going into a depression. She is currently taking remeron 15 mg daily at bedtime with trazodone 150 mg at bedtime for   She never saw gyn for abnormal pap.   Review of Systems:  Review of Systems  Constitutional:  Negative for chills, fever and weight loss.  HENT:  Negative for tinnitus.   Respiratory:  Negative for cough, sputum production and shortness of breath.   Cardiovascular:  Negative for chest pain, palpitations and leg swelling.  Gastrointestinal:  Negative for abdominal pain, constipation, diarrhea and heartburn.  Genitourinary:  Negative for dysuria, frequency and urgency.  Musculoskeletal:  Positive for joint pain. Negative  for back pain, falls and myalgias.  Skin: Negative.   Neurological:  Negative for dizziness and headaches.  Psychiatric/Behavioral:  Positive for depression. Negative for memory loss. The patient does not have insomnia.     Past Medical History:  Diagnosis Date   Anemia    Anxiety    At risk for sudden cardiac death 11-23-2012   CHF - Combined Systolic + Diastolic Dysfunction. EF of 15-20% with Grade II diastolic dysfunction on echo 10/27/12 10/28/2012   CKD (chronic kidney disease)    Coronary artery disease    Depression    .   Dyspnea    none per pt on 04/23/22   Fibroids    Heart murmur    History of blood transfusion 2014   Hypertension    Hypothyroidism    S/P cardiac catheterization, 11-23-12, normal coronaries with minimal luminal irregularities in RCA system 10/30/2012   Thyroid disease    hypothyroidism   Past Surgical History:  Procedure Laterality Date   AORTIC VALVE REPLACEMENT N/A 03/12/2017   Procedure: AORTIC VALVE REPLACEMENT (AVR) with Mitral Annuloplasty;  Surgeon: Gaye Pollack, MD;  Location: [facility];  Service: Open Heart Surgery;  Laterality: N/A;   EXTERNAL FIXATION LEG Right 04/24/2022   Procedure: EXTERNAL FIXATION RIGHT LEG;  Surgeon: Armond Hang, MD;  Location: [place];  Service: Orthopedics;  Laterality: Right;   EXTERNAL FIXATION REMOVAL Right 05/07/2022   Procedure: REMOVAL EXTERNAL FIXATION LEG right;  Surgeon: Armond Hang, MD;  Location: [facility];  Service: Orthopedics;  Laterality: Right;  120 min  LEFT AND RIGHT HEART CATHETERIZATION WITH CORONARY ANGIOGRAM N/A 10/29/2012   Procedure: LEFT AND RIGHT HEART CATHETERIZATION WITH CORONARY ANGIOGRAM;  Surgeon: Leonie Man, MD;  Location: [facility] CATH LAB;  Service: Cardiovascular;  Laterality: N/A;   MITRAL VALVE REPAIR N/A 03/12/2017   Procedure: MITRAL VALVE REPAIR (MVR);  Surgeon: Gaye Pollack, MD;  Location: [place];  Service: Open Heart Surgery;  Laterality: N/A;  mitral annuloplasty   MULTIPLE  EXTRACTIONS WITH ALVEOLOPLASTY N/A 01/21/2017   Procedure: Extraction of tooth #'s 4-13 with alveoloplasty;  Surgeon: Lenn Cal, DDS;  Location: [place];  Service: Oral Surgery;  Laterality: N/A;   ORIF ANKLE FRACTURE Right 05/07/2022   Procedure: OPEN REDUCTION INTERNAL FIXATION (ORIF)right  ANKLE FRACTURE possible syndesmosis and/or deltoid fixation, possible allograft;  Surgeon: Armond Hang, MD;  Location: [place];  Service: Orthopedics;  Laterality: Right;  120 min   RIGHT/LEFT HEART CATH AND CORONARY ANGIOGRAPHY N/A 01/09/2017   Procedure: Right/Left Heart Cath and Coronary Angiography;  Surgeon: Troy Sine, MD;  Location: [place] CV LAB;  Service: Cardiovascular;  Laterality: N/A;   TEE WITHOUT CARDIOVERSION N/A 03/12/2017   Procedure: TRANSESOPHAGEAL ECHOCARDIOGRAM (TEE);  Surgeon: Gaye Pollack, MD;  Location: [place] [facility];  Service: Open Heart Surgery;  Laterality: N/A;   Social History:   reports that she quit smoking about 44 years ago. Her smoking use included cigars and cigarettes. She has a 2.50 pack-year smoking history. She has never used smokeless tobacco. She reports current alcohol use of about 5.0 standard drinks of alcohol per week. She reports that she does not currently use drugs after having used the following drugs: Marijuana. Frequency: 5.00 times per week.  Family History  Problem Relation Age of Onset   Stroke Mother    Hypertension Mother    Liver disease Mother        autoimmune   Heart disease Mother    Colon polyps Mother    Stroke Father    Hypertension Father    Hypertension Maternal Grandmother    Heart disease Maternal Grandmother    Bone cancer Paternal Grandfather    Bone cancer Maternal Uncle    Arthritis Paternal Uncle    Breast cancer Cousin     Medications: Patient's Medications  New Prescriptions   No medications on file  Previous Medications   ACETAMINOPHEN (TYLENOL) 500 MG TABLET    Take 1,000 mg by mouth every 6 (six) hours as  needed for mild pain.   ASPIRIN EC 81 MG EC TABLET    Take 1 tablet (81 mg total) by mouth daily.   CARVEDILOL (COREG) 6.25 MG TABLET    Take 1 tablet by mouth twice daily   CHOLECALCIFEROL (D3) 50 MCG (2000 UT) TABS    Take 2,000 Units by mouth daily.   FERROUS SULFATE 325 (65 FE) MG EC TABLET    Take 1 tablet (325 mg total) by mouth 2 (two) times daily.   FLUTICASONE (FLONASE) 50 MCG/ACT NASAL SPRAY    USE 1 SPRAY(S) IN EACH NOSTRIL ONCE DAILY AS NEEDED FOR ALLERGIES OR RHINITIS   FUROSEMIDE (LASIX) 40 MG TABLET    Take 1 tablet by mouth once daily   LEVOTHYROXINE (SYNTHROID) 100 MCG TABLET    TAKE 1 TABLET BY MOUTH ONCE DAILY BEFORE BREAKFAST   LISINOPRIL (ZESTRIL) 5 MG TABLET    TAKE 1 TABLET BY MOUTH AT BEDTIME . APPOINTMENT REQUIRED FOR FUTURE REFILLS   MIRTAZAPINE (REMERON) 15 MG TABLET  TAKE 1 TABLET BY MOUTH ONCE DAILY AT BEDTIME   ROSUVASTATIN (CRESTOR) 40 MG TABLET    Take 1 tablet by mouth once daily   TRAZODONE (DESYREL) 150 MG TABLET    TAKE 1 TABLET BY MOUTH ONCE DAILY AT BEDTIME  Modified Medications   No medications on file  Discontinued Medications   BUPROPION (WELLBUTRIN XL) 150 MG 24 HR TABLET    Take 1 tablet (150 mg total) by mouth daily.   OXYCODONE-ACETAMINOPHEN (PERCOCET/ROXICET) 5-325 MG TABLET    Take 1 tablet by mouth every 6 (six) hours as needed for severe pain.    Physical Exam:  Vitals:   09/19/22 0949 09/19/22 1013  BP: (!) 136/90 (!) 146/98  Pulse: 72   Temp: (!) 97.4 F (36.3 C)   TempSrc: Temporal   SpO2: 99%   Weight: 141 lb 9.6 oz (64.2 kg)   Height: 5' 5"$  (1.651 m)    Body mass index is 23.56 kg/m. Wt Readings from Last 3 Encounters:  09/19/22 141 lb 9.6 oz (64.2 kg)  05/07/22 142 lb (64.4 kg)  04/24/22 142 lb (64.4 kg)    Physical Exam Constitutional:      General: She is not in acute distress.    Appearance: She is well-developed. She is not diaphoretic.  HENT:     Head: Normocephalic and atraumatic.     Mouth/Throat:      Pharynx: No oropharyngeal exudate.  Eyes:     Conjunctiva/sclera: Conjunctivae normal.     Pupils: Pupils are equal, round, and reactive to light.  Cardiovascular:     Rate and Rhythm: Normal rate and regular rhythm.     Heart sounds: Normal heart sounds.  Pulmonary:     Effort: Pulmonary effort is normal.     Breath sounds: Normal breath sounds.  Abdominal:     General: Bowel sounds are normal.     Palpations: Abdomen is soft.  Musculoskeletal:     Cervical back: Normal range of motion and neck supple.     Right lower leg: No edema.     Left lower leg: No edema.  Skin:    General: Skin is warm and dry.  Neurological:     Mental Status: She is alert.  Psychiatric:        Mood and Affect: Mood normal.     Labs reviewed: Basic Metabolic Panel: Recent Labs    10/25/21 0947 11/12/21 0857 11/15/21 1302 04/21/22 0951  NA 138  --  139 137  K 3.8  --  3.7 4.7  CL 101  --  106 102  CO2 26  --  27 26  GLUCOSE 71  --  78 84  BUN 23  --  14 17  CREATININE 1.13*  --  0.94 0.98  CALCIUM CANCELED  10.9* 10.2 10.1 10.1  TSH 4.49  --   --   --    Liver Function Tests: Recent Labs    10/25/21 0947 11/12/21 0857 11/15/21 1302 04/21/22 0951  AST 26  --  34 21  ALT 17  --  20 8  ALKPHOS  --   --  54  --   BILITOT 0.3  --  0.3 0.5  PROT 9.6* 8.6* 9.4* 9.0*  ALBUMIN  --   --  4.5  --    No results for input(s): "LIPASE", "AMYLASE" in the last 8760 hours. No results for input(s): "AMMONIA" in the last 8760 hours. CBC: Recent Labs    10/25/21 0947 11/15/21  1302 04/21/22 0951  WBC 4.8 4.9 6.2  NEUTROABS 2,722 2.4 3,968  HGB 13.4 11.7* 12.0  HCT 42.1 36.7 37.2  MCV 86.6 87.4 86.9  PLT 148 147* 186   Lipid Panel: Recent Labs    05/19/22 1039  CHOL 151  HDL 56  LDLCALC 78  TRIG 91  CHOLHDL 2.7   TSH: Recent Labs    10/25/21 0947  TSH 4.49   A1C: Lab Results  Component Value Date   HGBA1C 5.4 06/08/2019     Assessment/Plan 1. Encounter for  colorectal cancer screening - Cologuard  2. Hypothyroidism, unspecified type -will follow up TSH Continues on synthroid 100 mcg  3. Cardiomyopathy, nonischemic (HCC) -stable, continues on lisinopril, lasix and coreg   4. Chronic systolic congestive heart failure (HCC) -stable, euvolemic on current regimen.   5. Primary hypertension -continue dietary modifications with medication Slightly elevated today but generally well controlled, will increase lisinorpil if continues to be elevated at follow up.  - CBC with Differential/Platelet - Complete Metabolic Panel with eGFR  6. Iron deficiency anemia due to chronic blood loss -continues on iron supplement, will follow up cbc.   7. Depression -worsening depression off wellbutrin but unable to take due to excessive sweating -discussed options. She reports she needs medication to help her sleep as well.  Will have her titrate off Remeron and will increase trazodone to see if this helps symptoms.  -trazodone 200 mg by mouth at bedtime for depression and sleep. To notify if symptoms worsens prior to follow up.    Return in about 4 weeks (around 10/17/2022) for PAP and mood/sleep  follow up.  Carlos American. [place], [facility] [phone]

## 2022-09-21 LAB — CBC WITH DIFFERENTIAL/PLATELET
Absolute Monocytes: 343 cells/uL (ref 200–950)
Basophils Absolute: 22 cells/uL (ref 0–200)
Basophils Relative: 0.5 %
Eosinophils Absolute: 92 cells/uL (ref 15–500)
Eosinophils Relative: 2.1 %
HCT: 37.9 % (ref 35.0–45.0)
Hemoglobin: 12.4 g/dL (ref 11.7–15.5)
Lymphs Abs: 1676 cells/uL (ref 850–3900)
MCH: 27.1 pg (ref 27.0–33.0)
MCHC: 32.7 g/dL (ref 32.0–36.0)
MCV: 82.8 fL (ref 80.0–100.0)
MPV: 12 fL (ref 7.5–12.5)
Monocytes Relative: 7.8 %
Neutro Abs: 2266 cells/uL (ref 1500–7800)
Neutrophils Relative %: 51.5 %
Platelets: 139 10*3/uL — ABNORMAL LOW (ref 140–400)
RBC: 4.58 10*6/uL (ref 3.80–5.10)
RDW: 14.5 % (ref 11.0–15.0)
Total Lymphocyte: 38.1 %
WBC: 4.4 10*3/uL (ref 3.8–10.8)

## 2022-09-21 LAB — TEST AUTHORIZATION

## 2022-09-21 LAB — COMPLETE METABOLIC PANEL WITH GFR
AG Ratio: 0.9 (calc) — ABNORMAL LOW (ref 1.0–2.5)
ALT: 9 U/L (ref 6–29)
AST: 19 U/L (ref 10–35)
Albumin: 4.1 g/dL (ref 3.6–5.1)
Alkaline phosphatase (APISO): 64 U/L (ref 37–153)
BUN: 14 mg/dL (ref 7–25)
CO2: 22 mmol/L (ref 20–32)
Calcium: 9.9 mg/dL (ref 8.6–10.4)
Chloride: 108 mmol/L (ref 98–110)
Creat: 0.92 mg/dL (ref 0.50–1.03)
Globulin: 4.4 g/dL (calc) — ABNORMAL HIGH (ref 1.9–3.7)
Glucose, Bld: 99 mg/dL (ref 65–99)
Potassium: 4.2 mmol/L (ref 3.5–5.3)
Sodium: 141 mmol/L (ref 135–146)
Total Bilirubin: 0.3 mg/dL (ref 0.2–1.2)
Total Protein: 8.5 g/dL — ABNORMAL HIGH (ref 6.1–8.1)
eGFR: 72 mL/min/{1.73_m2} (ref >=60)

## 2022-09-21 LAB — TSH: TSH: 3.35 mIU/L (ref 0.40–4.50)

## 2022-10-10 LAB — COLOGUARD

## 2022-10-14 ENCOUNTER — Encounter: Payer: Self-pay | Admitting: Nurse Practitioner

## 2022-10-17 ENCOUNTER — Encounter: Payer: Self-pay | Admitting: Nurse Practitioner

## 2022-10-17 ENCOUNTER — Ambulatory Visit (INDEPENDENT_AMBULATORY_CARE_PROVIDER_SITE_OTHER): Payer: HMO | Admitting: Nurse Practitioner

## 2022-10-17 VITALS — BP 132/88 | HR 72 | Temp 97.1°F | Ht 65.0 in | Wt 145.0 lb

## 2022-10-17 DIAGNOSIS — Z124 Encounter for screening for malignant neoplasm of cervix: Secondary | ICD-10-CM

## 2022-10-17 DIAGNOSIS — J302 Other seasonal allergic rhinitis: Secondary | ICD-10-CM

## 2022-10-17 DIAGNOSIS — F331 Major depressive disorder, recurrent, moderate: Secondary | ICD-10-CM | POA: Diagnosis not present

## 2022-10-17 DIAGNOSIS — R87610 Atypical squamous cells of undetermined significance on cytologic smear of cervix (ASC-US): Secondary | ICD-10-CM | POA: Diagnosis not present

## 2022-10-17 DIAGNOSIS — R61 Generalized hyperhidrosis: Secondary | ICD-10-CM

## 2022-10-17 MED ORDER — FLUTICASONE PROPIONATE 50 MCG/ACT NA SUSP: 1.0000 | Freq: Two times a day (BID) | NASAL | 5 refills | Status: DC | PRN

## 2022-10-17 MED ORDER — TRAZODONE HCL 100 MG PO TABS: 200.0000 mg | ORAL_TABLET | Freq: Every day | ORAL | 1 refills | Status: DC

## 2022-10-17 MED ORDER — BREXPIPRAZOLE 1 MG PO TABS: 1.0000 mg | ORAL_TABLET | Freq: Every day | ORAL | 1 refills | Status: DC

## 2022-10-17 NOTE — Progress Notes (Signed)
Careteam: Patient Care Team: Lauree Chandler, NP as PCP - General (Geriatric Medicine) Jerline Pain, MD as PCP - Cardiology (Cardiology) Emily Filbert, MD as Consulting Physician (Obstetrics and Gynecology) Haroldine Laws, Shaune Pascal, MD as Consulting Physician (Cardiology) Leta Baptist, Earlean Polka, MD as Consulting Physician (Neurology)  PLACE OF SERVICE:  [place] Directive information Does Patient Have a Medical Advance Directive?: No, Would patient like information on creating a medical advance directive?: No - Patient declined  Allergies  Allergen Reactions   Codeine Hives   Wellbutrin [Bupropion]     Excessive sweating     Chief Complaint  Patient presents with   Follow-up    4 week follow-up on mood and pap smear.      HPI: Patient is a 57 y.o. female to follow up mood She also needs PAP.  She decreased her remeron and then stopped  She increased trazodone She has had some trouble sleeping off and on.  She is having headaches and hearing movements, ringing of ears-- does not happen every day but happens a few times a week.   Overall mood has not changed.    Review of Systems:  Review of Systems  Constitutional:  Negative for chills, fever and weight loss.  HENT:  Negative for tinnitus.   Respiratory:  Negative for cough, sputum production and shortness of breath.   Cardiovascular:  Negative for chest pain, palpitations and leg swelling.  Gastrointestinal:  Negative for abdominal pain, constipation, diarrhea and heartburn.  Genitourinary:  Negative for dysuria, frequency and urgency.  Musculoskeletal:  Negative for back pain, falls, joint pain and myalgias.  Skin: Negative.   Neurological:  Positive for dizziness and headaches.  Psychiatric/Behavioral:  Positive for depression. Negative for memory loss. The patient has insomnia.     Past Medical History:  Diagnosis Date   Anemia    Anxiety    At risk for sudden cardiac death 11/01/12   CHF -  Combined Systolic + Diastolic Dysfunction. EF of 15-20% with Grade II diastolic dysfunction on echo 10/27/12 10/28/2012   CKD (chronic kidney disease)    Coronary artery disease    Depression    .   Dyspnea    none per pt on 04/23/22   Fibroids    Heart murmur    History of blood transfusion 2014   Hypertension    Hypothyroidism    S/P cardiac catheterization, 11/01/12, normal coronaries with minimal luminal irregularities in RCA system 10/30/2012   Thyroid disease    hypothyroidism   Past Surgical History:  Procedure Laterality Date   AORTIC VALVE REPLACEMENT N/A 03/12/2017   Procedure: AORTIC VALVE REPLACEMENT (AVR) with Mitral Annuloplasty;  Surgeon: Gaye Pollack, MD;  Location: [place];  Service: Open Heart Surgery;  Laterality: N/A;   EXTERNAL FIXATION LEG Right 04/24/2022   Procedure: EXTERNAL FIXATION RIGHT LEG;  Surgeon: Armond Hang, MD;  Location: [place];  Service: Orthopedics;  Laterality: Right;   EXTERNAL FIXATION REMOVAL Right 05/07/2022   Procedure: REMOVAL EXTERNAL FIXATION LEG right;  Surgeon: Armond Hang, MD;  Location: [place];  Service: Orthopedics;  Laterality: Right;  120 min   LEFT AND RIGHT HEART CATHETERIZATION WITH CORONARY ANGIOGRAM N/A 11-01-2012   Procedure: LEFT AND RIGHT HEART CATHETERIZATION WITH CORONARY ANGIOGRAM;  Surgeon: Leonie Man, MD;  Location: [facility] CATH LAB;  Service: Cardiovascular;  Laterality: N/A;   MITRAL VALVE REPAIR N/A 03/12/2017   Procedure: MITRAL VALVE REPAIR (MVR);  Surgeon: Gaye Pollack,  MD;  [facility];  Service: Open Heart Surgery;  Laterality: N/A;  mitral annuloplasty   MULTIPLE EXTRACTIONS WITH ALVEOLOPLASTY N/A 01/21/2017   Procedure: Extraction of tooth #'s 4-13 with alveoloplasty;  Surgeon: Lenn Cal, DDS;  Location: [place];  Service: Oral Surgery;  Laterality: N/A;   ORIF ANKLE FRACTURE Right 05/07/2022   Procedure: OPEN REDUCTION INTERNAL FIXATION (ORIF)right  ANKLE FRACTURE possible syndesmosis and/or  deltoid fixation, possible allograft;  Surgeon: Armond Hang, MD;  Location: [place];  Service: Orthopedics;  Laterality: Right;  120 min   RIGHT/LEFT HEART CATH AND CORONARY ANGIOGRAPHY N/A 01/09/2017   Procedure: Right/Left Heart Cath and Coronary Angiography;  Surgeon: Troy Sine, MD;  Location: [place] CV LAB;  Service: Cardiovascular;  Laterality: N/A;   TEE WITHOUT CARDIOVERSION N/A 03/12/2017   Procedure: TRANSESOPHAGEAL ECHOCARDIOGRAM (TEE);  Surgeon: Gaye Pollack, MD;  Location: [place];  Service: Open Heart Surgery;  Laterality: N/A;   Social History:   reports that she quit smoking about 44 years ago. Her smoking use included cigars and cigarettes. She has a 2.50 pack-year smoking history. She has never used smokeless tobacco. She reports current alcohol use of about 5.0 standard drinks of alcohol per week. She reports that she does not currently use drugs after having used the following drugs: Marijuana. Frequency: 5.00 times per week.  Family History  Problem Relation Age of Onset   Stroke Mother    Hypertension Mother    Liver disease Mother        autoimmune   Heart disease Mother    Colon polyps Mother    Stroke Father    Hypertension Father    Hypertension Maternal Grandmother    Heart disease Maternal Grandmother    Bone cancer Paternal Grandfather    Bone cancer Maternal Uncle    Arthritis Paternal Uncle    Breast cancer Cousin     Medications: Patient's Medications  New Prescriptions   No medications on file  Previous Medications   ACETAMINOPHEN (TYLENOL) 500 MG TABLET    Take 1,000 mg by mouth every 6 (six) hours as needed for mild pain.   ASPIRIN EC 81 MG EC TABLET    Take 1 tablet (81 mg total) by mouth daily.   CARVEDILOL (COREG) 6.25 MG TABLET    Take 1 tablet by mouth twice daily   CHOLECALCIFEROL (D3) 50 MCG (2000 UT) TABS    Take 2,000 Units by mouth daily.   FERROUS SULFATE 325 (65 FE) MG EC TABLET    Take 1 tablet (325 mg total) by mouth 2  (two) times daily.   FLUTICASONE (FLONASE) 50 MCG/ACT NASAL SPRAY    USE 1 SPRAY(S) IN EACH NOSTRIL ONCE DAILY AS NEEDED FOR ALLERGIES OR RHINITIS   FUROSEMIDE (LASIX) 40 MG TABLET    Take 1 tablet by mouth once daily   LEVOTHYROXINE (SYNTHROID) 100 MCG TABLET    TAKE 1 TABLET BY MOUTH ONCE DAILY BEFORE BREAKFAST   LISINOPRIL (ZESTRIL) 5 MG TABLET    TAKE 1 TABLET BY MOUTH AT BEDTIME . APPOINTMENT REQUIRED FOR FUTURE REFILLS   ROSUVASTATIN (CRESTOR) 40 MG TABLET    Take 1 tablet by mouth once daily   TRAZODONE (DESYREL) 150 MG TABLET    TAKE 1 TABLET BY MOUTH ONCE DAILY AT BEDTIME   TRAZODONE (DESYREL) 50 MG TABLET    Take 1 tablet (50 mg total) by mouth at bedtime. Take with 150 mg tablet to equal 200 mg  Modified  Medications   No medications on file  Discontinued Medications   MIRTAZAPINE (REMERON) 15 MG TABLET    TAKE 1 TABLET BY MOUTH ONCE DAILY AT BEDTIME    Physical Exam:  Vitals:   10/17/22 0945  BP: 132/88  Pulse: 72  Temp: (!) 97.1 F (36.2 C)  TempSrc: Temporal  SpO2: 99%  Weight: 145 lb (65.8 kg)  Height: 5\' 5"  (1.651 m)   Body mass index is 24.13 kg/m. Wt Readings from Last 3 Encounters:  10/17/22 145 lb (65.8 kg)  09/19/22 141 lb 9.6 oz (64.2 kg)  05/07/22 142 lb (64.4 kg)    Physical Exam Exam conducted with a chaperone present.  Constitutional:      General: She is not in acute distress.    Appearance: She is well-developed. She is not diaphoretic.  HENT:     Head: Normocephalic and atraumatic.     Mouth/Throat:     Pharynx: No oropharyngeal exudate.  Eyes:     Conjunctiva/sclera: Conjunctivae normal.     Pupils: Pupils are equal, round, and reactive to light.  Cardiovascular:     Rate and Rhythm: Normal rate and regular rhythm.     Heart sounds: Normal heart sounds.  Pulmonary:     Effort: Pulmonary effort is normal.     Breath sounds: Normal breath sounds.  Abdominal:     General: Bowel sounds are normal.     Palpations: Abdomen is soft.      Hernia: There is no hernia in the left inguinal area or right inguinal area.  Genitourinary:    General: Normal vulva.     Vagina: Normal.     Cervix: Normal.     Uterus: Normal.      Adnexa: Right adnexa normal and left adnexa normal.     Rectum: Normal.  Musculoskeletal:     Cervical back: Normal range of motion and neck supple.     Right lower leg: No edema.     Left lower leg: No edema.  Skin:    General: Skin is warm and dry.  Neurological:     Mental Status: She is alert.  Psychiatric:        Mood and Affect: Mood normal.     Labs reviewed: Basic Metabolic Panel: Recent Labs    10/25/21 0947 11/12/21 0857 11/15/21 1302 04/21/22 0951 09/19/22 1044  NA 138  --  139 137 141  K 3.8  --  3.7 4.7 4.2  CL 101  --  106 102 108  CO2 26  --  27 26 22   GLUCOSE 71  --  78 84 99  BUN 23  --  14 17 14   CREATININE 1.13*  --  0.94 0.98 0.92  CALCIUM CANCELED  10.9*   < > 10.1 10.1 9.9  TSH 4.49  --   --   --  3.35   < > = values in this interval not displayed.   Liver Function Tests: Recent Labs    11/15/21 1302 04/21/22 0951 09/19/22 1044  AST 34 21 19  ALT 20 8 9   ALKPHOS 54  --   --   BILITOT 0.3 0.5 0.3  PROT 9.4* 9.0* 8.5*  ALBUMIN 4.5  --   --    No results for input(s): "LIPASE", "AMYLASE" in the last 8760 hours. No results for input(s): "AMMONIA" in the last 8760 hours. CBC: Recent Labs    11/15/21 1302 04/21/22 0951 09/19/22 1044  WBC 4.9 6.2 4.4  NEUTROABS  2.4 3,968 2,266  HGB 11.7* 12.0 12.4  HCT 36.7 37.2 37.9  MCV 87.4 86.9 82.8  PLT 147* 186 139*   Lipid Panel: Recent Labs    05/19/22 1039  CHOL 151  HDL 56  LDLCALC 78  TRIG 91  CHOLHDL 2.7   TSH: Recent Labs    10/25/21 0947 09/19/22 1044  TSH 4.49 3.35   A1C: Lab Results  Component Value Date   HGBA1C 5.4 06/08/2019     Assessment/Plan 1. Cervical cancer screening Abnormal in the past, due for follow up today - PAP, Image Guided [[facility]]  2. Moderate  episode of recurrent major depressive disorder (HCC) -ongoing, tolerating trazodone 200 mg qhs. Will add rexulti to help manage  Wellbutrin causing increase in sweating - brexpiprazole (REXULTI) 1 MG TABS tablet; Take 1 tablet (1 mg total) by mouth daily.  Dispense: 30 tablet; Refill: 1 - traZODone (DESYREL) 100 MG tablet; Take 2 tablets (200 mg total) by mouth at bedtime.  Dispense: 60 tablet; Refill: 1  3. Excessive sweating -improved since off Wellbutrin.   4. Seasonal allergies - fluticasone (FLONASE) 50 MCG/ACT nasal spray; Place 1 spray into both nostrils 2 (two) times daily as needed for allergies or rhinitis.  Dispense: 16 g; Refill: 5  AWV in 1 month Follow up in 4 months, sooner if needed  Dametrius Sanjuan K. [place], [facility] [phone]

## 2022-10-17 NOTE — Patient Instructions (Signed)
Start rexulti 0.5 mg by mouth for 1 week then increase to 1 mg daily to help with mood.   Continue all other medication.   Rx sent to the pharmacy- let us know if this is not affordable.

## 2022-10-21 ENCOUNTER — Other Ambulatory Visit: Payer: Self-pay | Admitting: Nurse Practitioner

## 2022-10-21 DIAGNOSIS — R87619 Unspecified abnormal cytological findings in specimens from cervix uteri: Secondary | ICD-10-CM

## 2022-10-21 LAB — PAP IG (IMAGE GUIDED)

## 2022-11-06 DIAGNOSIS — S82851A Displaced trimalleolar fracture of right lower leg, initial encounter for closed fracture: Secondary | ICD-10-CM | POA: Diagnosis not present

## 2022-11-19 ENCOUNTER — Encounter (HOSPITAL_BASED_OUTPATIENT_CLINIC_OR_DEPARTMENT_OTHER): Payer: Self-pay | Admitting: Orthopaedic Surgery

## 2022-11-19 ENCOUNTER — Telehealth: Payer: Self-pay | Admitting: *Deleted

## 2022-11-19 ENCOUNTER — Other Ambulatory Visit: Payer: Self-pay

## 2022-11-19 NOTE — Telephone Encounter (Signed)
   Pre-operative Risk Assessment    Patient Name: Miranda [name]  DOB: [birth year] MRN: [id number]      Request for Surgical Clearance    Procedure:   RIGHT ANKLE REMOVAL OF HARDWARE  (SYNDESMOTIC SCREWS x 2)  Date of Surgery:  Clearance 11/26/22                                 Surgeon:  DR. Valli Glance Surgeon's Group or Practice Name:  [facility] Phone number:  [phone] ATTN: MEGAN DAVIS Fax number:  [phone]   Type of Clearance Requested:   - Medical ; ASA    Type of Anesthesia:   CHOICE   Additional requests/questions:    [name]   11/19/2022, 1:19 PM

## 2022-11-19 NOTE — Progress Notes (Signed)
VM left with Cordelia Pen at the office informing her that the pt will need instructions regarding her 81mg  ASA, pts cardiologist is Dr. Donato Schultz.

## 2022-11-19 NOTE — Telephone Encounter (Signed)
   Name: Miranda [name]  DOB: [birth year]  MRN: [id number]  Primary Cardiologist: Donato Schultz, MD  Chart reviewed as part of pre-operative protocol coverage. Because of [name]'s past medical history and time since last visit, she will require a follow-up in-office visit in order to better assess preoperative cardiovascular risk.  Pre-op covering staff: - Please schedule appointment and call patient to inform them. If patient already had an upcoming appointment within acceptable timeframe, please add "pre-op clearance" to the appointment notes so provider is aware. - Please contact requesting surgeon's office via preferred method (i.e, phone, fax) to inform them of need for appointment prior to surgery.  ASA hold can be addressed at the time of the appointment.  Sharlene Dory, PA-C  11/19/2022, 1:35 PM

## 2022-11-19 NOTE — Telephone Encounter (Signed)
I s/w the pt and she has been scheduled for in office appt needed for pre op clearance. Our office just received the request today for surgery 11/26/22 with pt also needing to hold ASA x 1 per the pt. Pt states she has not taken ASA yet today. I advised in order to hold ASA in time for her surgery to continue to hold ASA until after her surgery. Pt will see Robin Searing, NP 11/24/22 @ 2:45. I will update all parties involved.

## 2022-11-20 NOTE — Discharge Instructions (Addendum)
Netta Cedars, MD [facility]  Please read the following information regarding your care after surgery.  Medications  You only need a prescription for the narcotic pain medicine (ex. oxycodone, Percocet, Norco).  All of the other medicines listed below are available over the counter. ? Aleve 2 pills twice a day for the first 3 days after surgery. ? acetominophen (Tylenol) 650 mg every 4-6 hours as you need for minor to moderate pain ? oxycodone as prescribed for severe pain  ? To help prevent blood clots, take aspirin (81 mg) twice daily for 28 days after surgery (or total duration of nonweightbearing).  You should also get up every hour while you are awake to move around.  Weight Bearing ? OK to walk in boot ONLY AFTER the nerve block has completely worn off!!!  Cast / Splint / Dressing ? If you have a splint, do NOT remove this. Keep your splint, cast or dressing clean and dry.  Don't put anything (coat hanger, pencil, etc) down inside of it.  If it gets wet, call the office immediately to schedule an appointment for a cast change.  Swelling IMPORTANT: It is normal for you to have swelling where you had surgery. To reduce swelling and pain, keep at least 3 pillows under your leg so that your toes are above your nose and your heel is above the level of your hip.  It may be necessary to keep your foot or leg elevated for several weeks.  This is critical to helping your incisions heal and your pain to feel better.  Follow Up Call my office at [phone] when you are discharged from the hospital or surgery center to schedule an appointment to be seen 7-10 days after surgery.  Call my office at [phone] if you develop a fever >101.5 F, nausea, vomiting, bleeding from the surgical site or severe pain.     Post Anesthesia Home Care Instructions  Activity: Get plenty of rest for the remainder of the day. A responsible individual must stay with you for 24 hours following the  procedure.  For the next 24 hours, DO NOT: -Drive a car -Advertising copywriter -Drink alcoholic beverages -Take any medication unless instructed by your physician -Make any legal decisions or sign important papers.  Meals: Start with liquid foods such as gelatin or soup. Progress to regular foods as tolerated. Avoid greasy, spicy, heavy foods. If nausea and/or vomiting occur, drink only clear liquids until the nausea and/or vomiting subsides. Call your physician if vomiting continues.  Special Instructions/Symptoms: Your throat may feel dry or sore from the anesthesia or the breathing tube placed in your throat during surgery. If this causes discomfort, gargle with warm salt water. The discomfort should disappear within 24 hours.  Can take tylenol after 1:20 pm if need

## 2022-11-20 NOTE — H&P (Signed)
ORTHOPAEDIC SURGERY H&P  Subjective:  The patient presents with right ankle injury now here for elective removal of ankle hardware (syndesmotic screw)   Past Medical History:  Diagnosis Date   Anemia    Anxiety    At risk for sudden cardiac death 11/02/2012   CHF - Combined Systolic + Diastolic Dysfunction. EF of 15-20% with Grade II diastolic dysfunction on echo 10/27/12 10/28/2012   CKD (chronic kidney disease)    Coronary artery disease    Depression    .   Dyspnea    none per pt on 04/23/22   Fibroids    Heart murmur    History of blood transfusion 2014   Hypertension    Hypothyroidism    S/P cardiac catheterization, 02-Nov-2012, normal coronaries with minimal luminal irregularities in RCA system 10/30/2012   Thyroid disease    hypothyroidism    Past Surgical History:  Procedure Laterality Date   AORTIC VALVE REPLACEMENT N/A 03/12/2017   Procedure: AORTIC VALVE REPLACEMENT (AVR) with Mitral Annuloplasty;  Surgeon: Alleen Borne, MD;  Location: [facility];  Service: Open Heart Surgery;  Laterality: N/A;   EXTERNAL FIXATION LEG Right 04/24/2022   Procedure: EXTERNAL FIXATION RIGHT LEG;  Surgeon: Netta Cedars, MD;  Location: [facility];  Service: Orthopedics;  Laterality: Right;   EXTERNAL FIXATION REMOVAL Right 05/07/2022   Procedure: REMOVAL EXTERNAL FIXATION LEG right;  Surgeon: Netta Cedars, MD;  Location: [facility];  Service: Orthopedics;  Laterality: Right;  120 min   LEFT AND RIGHT HEART CATHETERIZATION WITH CORONARY ANGIOGRAM N/A 11/02/12   Procedure: LEFT AND RIGHT HEART CATHETERIZATION WITH CORONARY ANGIOGRAM;  Surgeon: Marykay Lex, MD;  Location: [facility] CATH LAB;  Service: Cardiovascular;  Laterality: N/A;   MITRAL VALVE REPAIR N/A 03/12/2017   Procedure: MITRAL VALVE REPAIR (MVR);  Surgeon: Alleen Borne, MD;  Location: [facility] OR;  Service: Open Heart Surgery;  Laterality: N/A;  mitral annuloplasty   MULTIPLE EXTRACTIONS WITH ALVEOLOPLASTY N/A 01/21/2017   Procedure:  Extraction of tooth #'s 4-13 with alveoloplasty;  Surgeon: Charlynne Pander, DDS;  Location: [facility] OR;  Service: Oral Surgery;  Laterality: N/A;   ORIF ANKLE FRACTURE Right 05/07/2022   Procedure: OPEN REDUCTION INTERNAL FIXATION (ORIF)right  ANKLE FRACTURE possible syndesmosis and/or deltoid fixation, possible allograft;  Surgeon: Netta Cedars, MD;  Location: [facility];  Service: Orthopedics;  Laterality: Right;  120 min   RIGHT/LEFT HEART CATH AND CORONARY ANGIOGRAPHY N/A 01/09/2017   Procedure: Right/Left Heart Cath and Coronary Angiography;  Surgeon: Lennette Bihari, MD;  Location: [facility] INVASIVE CV LAB;  Service: Cardiovascular;  Laterality: N/A;   TEE WITHOUT CARDIOVERSION N/A 03/12/2017   Procedure: TRANSESOPHAGEAL ECHOCARDIOGRAM (TEE);  Surgeon: Alleen Borne, MD;  Location: [facility] OR;  Service: Open Heart Surgery;  Laterality: N/A;     (Not in an outpatient encounter)    Allergies  Allergen Reactions   Codeine Hives   Wellbutrin [Bupropion]     Excessive sweating     Social History   Socioeconomic History   Marital status: Single    Spouse name: Not on file   Number of children: 1   Years of education: 12th   Highest education level: Not on file  Occupational History   Occupation: unemployed    Employer: [employer]  Tobacco Use   Smoking status: Former    Packs/day: 0.50    Years: 5.00    Additional pack years: 0.00    Total pack years: 2.50    Types: Cigars,  Cigarettes    Quit date: 08/04/1978    Years since quitting: 44.3   Smokeless tobacco: Never   Tobacco comments:    1 cigar daily-quit 10/24/12    Vaping Use   Vaping Use: Never used  Substance and Sexual Activity   Alcohol use: Yes    Alcohol/week: 5.0 standard drinks of alcohol    Types: 2 Glasses of wine, 3 Cans of beer per week    Comment: Either wine or Beer, not both   Drug use: Not Currently    Frequency: 5.0 times per week    Types: Marijuana    Comment: none since 2015   Sexual activity: Not  Currently    Birth control/protection: Post-menopausal  Other Topics Concern   Not on file  Social History Narrative   Patient lives at home alone. (Condo)   Caffeine use: 1/2 soda daily   Diet   Martial status:single   Is it one or more stories? Yes   How many persons live in your home? 1   Do you have any pets in your home? No   Current or past profession: Company secretary   Do you exercise? No   Do you have a living will? No    Do you have a DNR form? No    Do you have signed POA/HPOA forms? No    Social Determinants of Health   Financial Resource Strain: Low Risk  (11/06/2017)   Overall Financial Resource Strain (CARDIA)    Difficulty of Paying Living Expenses: Not hard at all  Food Insecurity: No Food Insecurity (11/22/2019)   Hunger Vital Sign    Worried About Running Out of Food in the Last Year: Never true    Ran Out of Food in the Last Year: Never true  Transportation Needs: No Transportation Needs (11/22/2019)   PRAPARE - Administrator, Civil Service (Medical): No    Lack of Transportation (Non-Medical): No  Physical Activity: Insufficiently Active (11/06/2017)   Exercise Vital Sign    Days of Exercise per Week: 5 days    Minutes of Exercise per Session: 20 min  Stress: Stress Concern Present (11/06/2017)   [employer] of Occupational Health - Occupational Stress Questionnaire    Feeling of Stress : To some extent  Social Connections: Moderately Isolated (11/06/2017)   Social Connection and Isolation Panel [NHANES]    Frequency of Communication with Friends and Family: More than three times a week    Frequency of Social Gatherings with Friends and Family: More than three times a week    Attends Religious Services: Never    Database administrator or Organizations: No    Attends Banker Meetings: Never    Marital Status: Never married  Intimate Partner Violence: Not At Risk (11/06/2017)   Humiliation, Afraid, Rape, and Kick questionnaire     Fear of Current or Ex-Partner: No    Emotionally Abused: No    Physically Abused: No    Sexually Abused: No     History reviewed. No pertinent family history.   Review of Systems Pertinent items are noted in HPI.  Objective: Vital signs in last 24 hours:    11/19/2022   10:05 AM 10/17/2022    9:45 AM 09/19/2022   10:13 AM  Vitals with BMI  Height     Weight 140 lbs 145 lbs   BMI 23.3 24.13   Systolic  132 146  Diastolic  88 98  Pulse  72       EXAM: General: Well nourished, well developed. Awake, alert and oriented to time, place, person. Normal mood and affect. No apparent distress. Breathing room air.  Operative Lower Extremity: Alignment - Neutral Deformity - None Skin intact Tenderness to palpation - None 5/5 TA, PT, GS, Per, EHL, FHL Sensation intact to light touch throughout Palpable DP and PT pulses Special testing: None  The contralateral foot/ankle was examined for comparison and noted to be neurovascularly intact with no localized deformity, swelling, or tenderness.  Imaging Review All images taken were independently reviewed by me.  Assessment/Plan: The clinical and radiographic findings were reviewed and discussed at length with the patient.  The patient has right ankle injury now here for elective removal of ankle hardware (syndesmotic screw).  We spoke at length about the natural course of these findings. We discussed nonoperative and operative treatment options in detail.  The risks and benefits were presented and reviewed. The risks due to partial/total inability to removal part/all of the hardware, recurrent instability, implant failure/irritation, infection, stiffness, nerve/vessel/tendon injury, wound healing issues, failure of this surgery, need for further surgery, thromboembolic events, amputation, death among others were discussed. The patient acknowledged the explanation and agreed to proceed with the plan.  Netta Cedars   Orthopaedic Surgery [facility]

## 2022-11-23 NOTE — Progress Notes (Unsigned)
Office Visit    Patient Name: Miranda [name] Date of Encounter: 11/24/2022  Primary Care Provider:  Sharon Seller, NP Primary Cardiologist:  Donato Schultz, MD Primary Electrophysiologist: None   Past Medical History    Past Medical History:  Diagnosis Date   Anemia    Anxiety    At risk for sudden cardiac death 11-14-2012   CHF - Combined Systolic + Diastolic Dysfunction. EF of 15-20% with Grade II diastolic dysfunction on echo 10/27/12 10/28/2012   CKD (chronic kidney disease)    Coronary artery disease    Depression    .   Dyspnea    none per pt on 04/23/22   Fibroids    Heart murmur    History of blood transfusion 2014   Hypertension    Hypothyroidism    S/P cardiac catheterization, Nov 14, 2012, normal coronaries with minimal luminal irregularities in RCA system 10/30/2012   Thyroid disease    hypothyroidism   Past Surgical History:  Procedure Laterality Date   AORTIC VALVE REPLACEMENT N/A 03/12/2017   Procedure: AORTIC VALVE REPLACEMENT (AVR) with Mitral Annuloplasty;  Surgeon: Alleen Borne, MD;  Location: [facility] OR;  Service: Open Heart Surgery;  Laterality: N/A;   EXTERNAL FIXATION LEG Right 04/24/2022   Procedure: EXTERNAL FIXATION RIGHT LEG;  Surgeon: Netta Cedars, MD;  Location: [facility] OR;  Service: Orthopedics;  Laterality: Right;   EXTERNAL FIXATION REMOVAL Right 05/07/2022   Procedure: REMOVAL EXTERNAL FIXATION LEG right;  Surgeon: Netta Cedars, MD;  Location: [facility] OR;  Service: Orthopedics;  Laterality: Right;  120 min   LEFT AND RIGHT HEART CATHETERIZATION WITH CORONARY ANGIOGRAM N/A 2012/11/14   Procedure: LEFT AND RIGHT HEART CATHETERIZATION WITH CORONARY ANGIOGRAM;  Surgeon: Marykay Lex, MD;  Location: [facility] CATH LAB;  Service: Cardiovascular;  Laterality: N/A;   MITRAL VALVE REPAIR N/A 03/12/2017   Procedure: MITRAL VALVE REPAIR (MVR);  Surgeon: Alleen Borne, MD;  Location: [facility] OR;  Service: Open Heart Surgery;  Laterality: N/A;  mitral annuloplasty    MULTIPLE EXTRACTIONS WITH ALVEOLOPLASTY N/A 01/21/2017   Procedure: Extraction of tooth #'s 4-13 with alveoloplasty;  Surgeon: Charlynne Pander, DDS;  Location: [facility] OR;  Service: Oral Surgery;  Laterality: N/A;   ORIF ANKLE FRACTURE Right 05/07/2022   Procedure: OPEN REDUCTION INTERNAL FIXATION (ORIF)right  ANKLE FRACTURE possible syndesmosis and/or deltoid fixation, possible allograft;  Surgeon: Netta Cedars, MD;  Location: [facility] OR;  Service: Orthopedics;  Laterality: Right;  120 min   RIGHT/LEFT HEART CATH AND CORONARY ANGIOGRAPHY N/A 01/09/2017   Procedure: Right/Left Heart Cath and Coronary Angiography;  Surgeon: Lennette Bihari, MD;  Location: [facility] INVASIVE CV LAB;  Service: Cardiovascular;  Laterality: N/A;   TEE WITHOUT CARDIOVERSION N/A 03/12/2017   Procedure: TRANSESOPHAGEAL ECHOCARDIOGRAM (TEE);  Surgeon: Alleen Borne, MD;  Location: [facility] OR;  Service: Open Heart Surgery;  Laterality: N/A;    Allergies  Allergies  Allergen Reactions   Codeine Hives   Wellbutrin [Bupropion]     Excessive sweating      History of Present Illness    Miranda [name]  is a 57 year old female with a PMH of AVR repair and MVR repair s/p 03/2017 with 23 mm Edwards Magna-Ease pericardial valve and MV annuloplasty with 28 mm Sorin 3D MEMO, NICM with prior EF of 50% now EF of 65%, HTN, hypothyroidism, dilated aortic root (40 mm), anxiety who presents today for preoperative clearance.  Miranda [name] was initially seen by Dr. Anne Fu in 2016 for chronic  systolic CHF with ICM.  EF improved to 50% in March 2015. She returned in 2018 with worsening shortness of breath, going up stairs for instance. An echocardiogram was then performed which showed severe aortic regurgitation and EF of 35%.  She underwent AVR/MV repair 8/18 by Dr. Laneta Simmers.  She was on Coumadin for 3 months and then switched to ASA.  2D echo was repeated however EF remained low at 30-35%.  GDMT was titrated with improvement to EF and last TTE completed  05/2020 with EF of 60 to 65% and grade 2 DD with stable AVR and MVR repairs. She was last seen by Dr. Anne Fu on 08/09/2021.  She was noted to be doing well fairly but had some shortness of breath with activity and climbing stairs but this is her baseline. She denied any chest discomfort with these symptoms.  She had no medication adjustments or changes made during visit.  She has been taking 81 mg ASA daily.  Miranda [name] presents today for preoperative clearance alone.  Since last being seen in the office patient reports she has not experienced any new cardiac complaints.  Her blood pressure however it has been really elevated and is initially 208/123 and with clonidine in the office 0.1 mg patient's blood pressure readings reduced to 162/100. She reports that her blood pressures at home have been ranging in the 150s systolically.  She denies any sodium indiscretions or changes to her diet.  She has no complaints of dizziness or headaches with her elevated blood pressure.  She did note some agitation with frequent doctors appointments today.  She also reports some discomfort and pain in her right ankle that was previously repaired.  It is hard to say if her current blood pressures are driven by this discomfort.  Patient denies chest pain, palpitations, dyspnea, PND, orthopnea, nausea, vomiting, dizziness, syncope, edema, weight gain, or early satiety.   Home Medications    Current Outpatient Medications  Medication Sig Dispense Refill   acetaminophen (TYLENOL) 500 MG tablet Take 1,000 mg by mouth every 6 (six) hours as needed for mild pain.     aspirin EC 81 MG EC tablet Take 1 tablet (81 mg total) by mouth daily.     brexpiprazole (REXULTI) 1 MG TABS tablet Take 1 tablet (1 mg total) by mouth daily. 30 tablet 1   carvedilol (COREG) 6.25 MG tablet Take 1 tablet by mouth twice daily 180 tablet 0   Cholecalciferol (D3) 50 MCG (2000 UT) TABS Take 2,000 Units by mouth daily.     ferrous sulfate 325 (65 FE) MG  EC tablet Take 1 tablet (325 mg total) by mouth 2 (two) times daily. 180 tablet 3   fluticasone (FLONASE) 50 MCG/ACT nasal spray Place 1 spray into both nostrils 2 (two) times daily as needed for allergies or rhinitis. 16 g 5   furosemide (LASIX) 40 MG tablet Take 1 tablet by mouth once daily 90 tablet 0   levothyroxine (SYNTHROID) 100 MCG tablet TAKE 1 TABLET BY MOUTH ONCE DAILY BEFORE BREAKFAST 90 tablet 1   lisinopril (ZESTRIL) 5 MG tablet TAKE 1 TABLET BY MOUTH AT BEDTIME . APPOINTMENT REQUIRED FOR FUTURE REFILLS 90 tablet 0   melatonin 1 MG TABS tablet Take 1 mg by mouth at bedtime.     rosuvastatin (CRESTOR) 40 MG tablet Take 1 tablet by mouth once daily 90 tablet 3   traZODone (DESYREL) 100 MG tablet Take 2 tablets (200 mg total) by mouth at bedtime. 60 tablet 1  No current facility-administered medications for this visit.   Facility-Administered Medications Ordered in Other Visits  Medication Dose Route Frequency Provider Last Rate Last Admin   chlorhexidine (HIBICLENS) 4 % liquid 4 Application  60 mL Topical Once Netta Cedars, MD         Review of Systems  Please see the history of present illness.    (+) Right ankle pain (+) Anxiety  All other systems reviewed and are otherwise negative except as noted above.  Physical Exam    Wt Readings from Last 3 Encounters:  11/24/22 140 lb 3.2 oz (63.6 kg)  10/17/22 145 lb (65.8 kg)  09/19/22 141 lb 9.6 oz (64.2 kg)   VS: Vitals:   11/24/22 1526 11/24/22 1605  BP: (!) 182/105 (!) 162/100  Pulse:    SpO2:    ,Body mass index is 23.33 kg/m.  Constitutional:      Appearance: Healthy appearance. Not in distress.  Neck:     Vascular: JVD normal.  Pulmonary:     Effort: Pulmonary effort is normal.     Breath sounds: No wheezing. No rales. Diminished in the bases Cardiovascular:     Normal rate. Regular rhythm. Normal S1. Normal S2.      Murmurs: There is no murmur.  Edema:    Peripheral edema absent.  Abdominal:      Palpations: Abdomen is soft non tender. There is no hepatomegaly.  Skin:    General: Skin is warm and dry.  Neurological:     General: No focal deficit present.     Mental Status: Alert and oriented to person, place and time.     Cranial Nerves: Cranial nerves are intact.  EKG/LABS/ Recent Cardiac Studies    ECG personally reviewed by me today -none completed today  Cardiac Studies & Procedures   CARDIAC CATHETERIZATION  CARDIAC CATHETERIZATION 01/09/2017  Narrative  Ost RPDA to RPDA lesion, 50 %stenosed.  There is severe left ventricular systolic dysfunction.  Hemodynamic findings consistent with moderate pulmonary hypertension.  There is mild (2+) mitral regurgitation.  Dilated cardiomyopathy with severe global LV dysfunction and an ejection fraction of 15%.  There appears to be at least mild to moderate mitral regurgitation.  Moderate elevation of right heart pressures with moderate pulmonary hypertension with a PA systolic pressure of 60 mmHg.  Mildly dilated aortic root with  moderately severe aortic insufficiency.  Mild coronary obstructive disease with a normal LAD, normal small intermediate vessel, normal left circumflex, and a large dominant RCA with smooth 50% ostial PDA narrowing.  RECOMMENDATION: The patient will undergo follow-up outpatient evaluation with Dr. Laneta Simmers.  Findings Coronary Findings Diagnostic  Dominance: Right  Left Main Vessel was injected. Vessel is normal in caliber. Vessel is angiographically normal.  Left Anterior Descending Vessel was injected. Vessel is normal in caliber. Vessel is angiographically normal.  Ramus Intermedius Vessel was injected. Vessel is small. Vessel is angiographically normal.  Left Circumflex Vessel was injected. Vessel is normal in caliber. Vessel is angiographically normal.  Right Coronary Artery The RCA was a large caliber dominant vessel.  There was smooth 50% ostial narrowing of the PDA branch.  Right  Posterior Descending Artery The lesion is smooth.  Intervention  No interventions have been documented.     ECHOCARDIOGRAM  ECHOCARDIOGRAM COMPLETE 05/14/2020  Narrative ECHOCARDIOGRAM REPORT    Patient Name:   XIMENA TODARO Date of Exam: 05/14/2020 Medical Rec #:  [id number]       Height:       65.0  in Accession #:    [id number]      Weight:       140.6 lb Date of Birth:  1965/10/17        BSA:          1.703 m Patient Age:    54 years        BP:           138/82 mmHg Patient Gender: F               HR:           69 bpm. Exam Location:  [address]  Procedure: 2D Echo, 3D Echo, Cardiac Doppler and Color Doppler  Indications:    I42.8 Non-Ischemic Cardiomyopathy I35.9 Aortic Valve Disorder  History:        Patient has prior history of Echocardiogram examinations, most recent 10/19/2017. CHF, CAD, Aortic Valve Disease, Signs/Symptoms:Dyspnea and Murmur; Risk Factors:Family History of Coronary Artery Disease, Hypertension, Dyslipidemia and Former Smoker. 03-12-17 Aortic Valve Replacement (23mm Edwards Magna Ease pericardial tissue Valve), Mitral Valve Repair (28mm Sorin 3D Memo Ring).  Sonographer:    Farrel Conners RDCS Referring Phys: [id number] LORI C GERHARDT  IMPRESSIONS   1. Left ventricular ejection fraction, by estimation, is 60 to 65%. The left ventricle has normal function. The left ventricle has no regional wall motion abnormalities. There is moderate left ventricular hypertrophy. Left ventricular diastolic parameters are consistent with Grade II diastolic dysfunction (pseudonormalization). Elevated left atrial pressure. 2. Right ventricular systolic function is mildly reduced. The right ventricular size is normal. There is normal pulmonary artery systolic pressure. The estimated right ventricular systolic pressure is 29.8 mmHg. 3. Left atrial size was severely dilated. 4. The mitral valve has been repaired/replaced. There is a 28 mm prosthetic annuloplasty ring  present in the mitral position. Mild to moderate mitral valve regurgitation. Mild mitral stenosis. MG 4 mmHg at HR 62 bpm, MVA 1.9 cm^2 by continuity equation. 5. The aortic valve has been repaired/replaced. There is a 23 mm bioprosthetic valve present in the aortic position. Aortic valve regurgitation is not visualized. Echo findings are consistent with normal structure and function of the aortic valve prosthesis. MG 9 mmHg, stable from prior echo on 10/19/17. 6. Aortic dilatation noted. There is mild dilatation of the aortic root, measuring 42 mm. There is mild dilatation of the ascending aorta, measuring 40 mm. 7. The inferior vena cava is normal in size with greater than 50% respiratory variability, suggesting right atrial pressure of 3 mmHg.  FINDINGS Left Ventricle: Left ventricular ejection fraction, by estimation, is 60 to 65%. The left ventricle has normal function. The left ventricle has no regional wall motion abnormalities. The left ventricular internal cavity size was normal in size. There is moderate left ventricular hypertrophy. Left ventricular diastolic parameters are consistent with Grade II diastolic dysfunction (pseudonormalization). Elevated left atrial pressure.  Right Ventricle: The right ventricular size is normal. Right vetricular wall thickness was not assessed. Right ventricular systolic function is mildly reduced. There is normal pulmonary artery systolic pressure. The tricuspid regurgitant velocity is 2.59 m/s, and with an assumed right atrial pressure of 3 mmHg, the estimated right ventricular systolic pressure is 29.8 mmHg.  Left Atrium: Left atrial size was severely dilated.  Right Atrium: Right atrial size was normal in size.  Pericardium: There is no evidence of pericardial effusion.  Mitral Valve: The mitral valve has been repaired/replaced. Mild to moderate mitral valve regurgitation. There is a 28 mm prosthetic annuloplasty ring present in  the mitral position.  Mild mitral valve stenosis. MV peak gradient, 11.4 mmHg. The mean mitral valve gradient is 4.0 mmHg.  Tricuspid Valve: The tricuspid valve is normal in structure. Tricuspid valve regurgitation is trivial.  Aortic Valve: The aortic valve has been repaired/replaced. Aortic valve regurgitation is not visualized. Aortic valve mean gradient measures 9.0 mmHg. Aortic valve peak gradient measures 16.5 mmHg. Aortic valve area, by VTI measures 2.07 cm. There is a 23 mm bioprosthetic valve present in the aortic position. Echo findings are consistent with normal structure and function of the aortic valve prosthesis.  Pulmonic Valve: The pulmonic valve was normal in structure. Pulmonic valve regurgitation is trivial.  Aorta: Aortic dilatation noted. There is mild dilatation of the aortic root, measuring 42 mm. There is mild dilatation of the ascending aorta, measuring 40 mm.  Venous: The inferior vena cava is normal in size with greater than 50% respiratory variability, suggesting right atrial pressure of 3 mmHg.  IAS/Shunts: No atrial level shunt detected by color flow Doppler.   LEFT VENTRICLE PLAX 2D LVIDd:         4.30 cm  Diastology LVIDs:         2.80 cm  LV e' medial:    4.35 cm/s LV PW:         1.10 cm  LV E/e' medial:  38.0 LV IVS:        1.10 cm  LV e' lateral:   8.70 cm/s LVOT diam:     2.40 cm  LV E/e' lateral: 19.0 LV SV:         99 LV SV Index:   58 LVOT Area:     4.52 cm  3D Volume EF: 3D EF:        64 % LV EDV:       139 ml LV ESV:       49 ml LV SV:        90 ml  RIGHT VENTRICLE RV S prime:     8.92 cm/s TAPSE (M-mode): 1.4 cm  LEFT ATRIUM              Index       RIGHT ATRIUM           Index LA diam:        5.20 cm  3.05 cm/m  RA Area:     17.10 cm LA Vol (A2C):   109.0 ml 64.01 ml/m RA Volume:   43.50 ml  25.54 ml/m LA Vol (A4C):   85.9 ml  50.44 ml/m LA Biplane Vol: 98.0 ml  57.55 ml/m AORTIC VALVE AV Area (Vmax):    1.76 cm AV Area (Vmean):   1.71  cm AV Area (VTI):     2.07 cm AV Vmax:           203.00 cm/s AV Vmean:          138.500 cm/s AV VTI:            0.478 m AV Peak Grad:      16.5 mmHg AV Mean Grad:      9.0 mmHg LVOT Vmax:         79.15 cm/s LVOT Vmean:        52.400 cm/s LVOT VTI:          0.218 m LVOT/AV VTI ratio: 0.46  AORTA Ao Root diam: 4.20 cm Ao Asc diam:  4.00 cm  MITRAL VALVE  TRICUSPID VALVE MV Area (PHT): 1.83 cm     TR Peak grad:   26.8 mmHg MV Peak grad:  11.4 mmHg    TR Vmax:        259.00 cm/s MV Mean grad:  4.0 mmHg MV Vmax:       1.69 m/s     SHUNTS MV Vmean:      94.6 cm/s    Systemic VTI:  0.22 m MV Decel Time: 386 msec     Systemic Diam: 2.40 cm MR Peak grad:   162.1 mmHg MR Mean grad:   99.0 mmHg MR Vmax:        636.50 cm/s MR Vmean:       466.0 cm/s MR PISA:        1.01 cm MR PISA Radius: 0.40 cm MV E velocity: 165.50 cm/s MV A velocity: 92.90 cm/s MV E/A ratio:  1.78  Epifanio Lesches MD Electronically signed by Epifanio Lesches MD Signature Date/Time: 05/14/2020/11:39:30 AM    Final   TEE  ECHO TEE 03/12/2017  Interpretation Summary  Left ventricle: Cavity is moderately dilated. LV systolic function is severely reduced with an EF of 25-30%. No thrombus present. No mass present.  Septum: No Patent Foramen Ovale present.  Left atrium: Patent foramen ovale not present.  Left atrium: Cavity is dilated and dilated.  Aortic valve: The valve is trileaflet. Mild valve thickening present. No stenosis. Moderate to severe regurgitation. There is flow reversal in the descending aorta.  Mitral valve: Non-specific thickening. Moderate regurgitation.  Right ventricle: Cavity is moderately dilated. Moderately reduced systolic function.  Right atrium: Cavity is dilated.  Tricuspid valve: Mild regurgitation. The tricuspid valve regurgitation jet is directed toward the right atrial free wall.  Pulmonic valve: Trace regurgitation.  Right ventricle: Normal  cavity size. Mildly reduced systolic function.            Risk Assessment/Calculations:            Lab Results  Component Value Date   WBC 4.4 09/19/2022   HGB 12.4 09/19/2022   HCT 37.9 09/19/2022   MCV 82.8 09/19/2022   PLT 139 (L) 09/19/2022   Lab Results  Component Value Date   CREATININE 0.94 11/24/2022   BUN 9 11/24/2022   NA 136 11/24/2022   K 3.8 11/24/2022   CL 101 11/24/2022   CO2 26 11/24/2022   Lab Results  Component Value Date   ALT 9 09/19/2022   AST 19 09/19/2022   ALKPHOS 54 11/15/2021   BILITOT 0.3 09/19/2022   Lab Results  Component Value Date   CHOL 151 05/19/2022   HDL 56 05/19/2022   LDLCALC 78 05/19/2022   TRIG 91 05/19/2022   CHOLHDL 2.7 05/19/2022    Lab Results  Component Value Date   HGBA1C 5.4 06/08/2019     Assessment & Plan    1.  Preoperative clearance: -Patient's blood pressure was initially 208/125 and reduced to 162/100 following dose of clonidine 0.1 mg.  She denies any headache or visual changes with this elevated blood pressure.  I advised her that clearance will be not granted until blood pressures are under better control.  Miranda [name] perioperative risk of a major cardiac event is 6.6% according to the Revised Cardiac Risk Index (RCRI).  Therefore, she is at high risk for perioperative complications.   Her functional capacity is fair at 5.07 METs according to the Duke Activity Status Index (DASI). Recommendations: According to ACC/AHA guidelines, no further cardiovascular testing needed.  The patient may proceed to surgery at acceptable risk.   Antiplatelet and/or Anticoagulation Recommendations: Aspirin can be held for 7 days prior to her surgery.  Please resume Aspirin post operatively when it is felt to be safe from a bleeding standpoint.   2.  History of AVR/MVR repair: -s/p 2018 with most recent TTE completed 05/2020 with stable repairs and EF of 60 to 65% -Patient has chronic shortness of breath with exertion  but today reports no complaints of shortness of breath with activity. -Patient aware of SBE prophylaxis for dental procedures  3.  Essential hypertension: -HYPERTENSION CONTROL Vitals:   11/24/22 1458 11/24/22 1526 11/24/22 1605  BP: (!) 208/123 (!) 182/105 (!) 162/100    The patient's blood pressure is elevated above target today.  In order to address the patient's elevated BP: A current anti-hypertensive medication was adjusted today.; A new medication was prescribed today.; Follow up with general cardiology has been recommended.     -We will add clonidine 0.1 mg as needed for systolic blood pressures greater than 150 and diastolic blood pressures greater than 100 -We will increase carvedilol to 12.5 mg twice daily -Continue lisinopril 5 mg daily -Patient will continue to monitor blood pressures over next 24 hours and report findings back to our office.  4.HFimEF: -2D echo completed 05/2020 with EF of 60-65% -Today patient is euvolemic on examination today.  She denies any sodium indiscretions and has some chronic shortness of breath but no new symptoms. -Continue carvedilol 12.5 mg twice daily, Lasix 40 mg daily, lisinopril 5 mg daily -Low sodium diet, fluid restriction <2L, and daily weights encouraged. Educated to contact our office for weight gain of 2 lbs overnight or 5 lbs in one week.    Disposition: Follow-up with Donato Schultz, MD or APP in 1 week   Medication Adjustments/Labs and Tests Ordered: Current medicines are reviewed at length with the patient today.  Concerns regarding medicines are outlined above.   Signed, Napoleon Form, Leodis Rains, NP 11/24/2022, 4:41 PM [facility]

## 2022-11-24 ENCOUNTER — Encounter (HOSPITAL_BASED_OUTPATIENT_CLINIC_OR_DEPARTMENT_OTHER)
Admission: RE | Admit: 2022-11-24 | Discharge: 2022-11-24 | Disposition: A | Discharge status: Home / Self Care | Payer: HMO | Source: Ambulatory Visit | Attending: Orthopaedic Surgery | Admitting: Orthopaedic Surgery

## 2022-11-24 ENCOUNTER — Ambulatory Visit: Payer: HMO | Attending: Nurse Practitioner | Admitting: Nurse Practitioner

## 2022-11-24 ENCOUNTER — Encounter: Payer: Self-pay | Admitting: Nurse Practitioner

## 2022-11-24 VITALS — BP 162/100 | HR 73 | Ht 65.0 in | Wt 140.2 lb

## 2022-11-24 DIAGNOSIS — Z952 Presence of prosthetic heart valve: Secondary | ICD-10-CM | POA: Diagnosis not present

## 2022-11-24 DIAGNOSIS — Z9889 Other specified postprocedural states: Secondary | ICD-10-CM

## 2022-11-24 DIAGNOSIS — I5022 Chronic systolic (congestive) heart failure: Secondary | ICD-10-CM | POA: Diagnosis not present

## 2022-11-24 DIAGNOSIS — Z01818 Encounter for other preprocedural examination: Secondary | ICD-10-CM | POA: Insufficient documentation

## 2022-11-24 DIAGNOSIS — Z01810 Encounter for preprocedural cardiovascular examination: Secondary | ICD-10-CM | POA: Diagnosis not present

## 2022-11-24 DIAGNOSIS — I5020 Unspecified systolic (congestive) heart failure: Secondary | ICD-10-CM

## 2022-11-24 DIAGNOSIS — I10 Essential (primary) hypertension: Secondary | ICD-10-CM

## 2022-11-24 DIAGNOSIS — I5032 Chronic diastolic (congestive) heart failure: Secondary | ICD-10-CM | POA: Diagnosis not present

## 2022-11-24 LAB — BASIC METABOLIC PANEL
Anion gap: 9 (ref 5–15)
BUN: 9 mg/dL (ref 6–20)
CO2: 26 mmol/L (ref 22–32)
Calcium: 9.7 mg/dL (ref 8.9–10.3)
Chloride: 101 mmol/L (ref 98–111)
Creatinine, Ser: 0.94 mg/dL (ref 0.44–1.00)
GFR, Estimated: >60 mL/min (ref >60)
Glucose, Bld: 90 mg/dL (ref 70–99)
Potassium: 3.8 mmol/L (ref 3.5–5.1)
Sodium: 136 mmol/L (ref 135–145)

## 2022-11-24 LAB — SURGICAL PCR SCREEN
MRSA, PCR: NEGATIVE
Staphylococcus aureus: NEGATIVE

## 2022-11-24 MED ORDER — CARVEDILOL 12.5 MG PO TABS: 12.5000 mg | ORAL_TABLET | Freq: Two times a day (BID) | ORAL | 0 refills | Status: DC

## 2022-11-24 MED ORDER — CLONIDINE HCL 0.1 MG PO TABS: 0.1000 mg | ORAL_TABLET | Freq: Every day | ORAL | 0 refills | Status: DC | PRN

## 2022-11-24 MED ORDER — CHLORHEXIDINE GLUCONATE 4 % EX SOLN: 60.0000 mL | Freq: Once | CUTANEOUS | Status: DC

## 2022-11-24 MED ORDER — CLONIDINE HCL 0.1 MG PO TABS
0.1000 mg | ORAL_TABLET | Freq: Once | ORAL | Status: AC
Start: 2022-11-24 — End: 2022-11-24
  Administered 2022-11-24: 0.1 mg via ORAL

## 2022-11-24 NOTE — Progress Notes (Signed)
Surgical soap given with instructions, pt verbalized understanding.  

## 2022-11-24 NOTE — Patient Instructions (Addendum)
Medication Instructions:  INCREASE Coreg to 12.5mg  Take 1 tablet twice a day START Clonidine 0.1mg  take as needed if top number is over 150's and bottom number is over 100. *If you need a refill on your cardiac medications before your next appointment, please call your pharmacy*   Lab Work: None ordered If you have labs (blood work) drawn today and your tests are completely normal, you will receive your results only by: MyChart Message (if you have MyChart) OR A paper copy in the mail If you have any lab test that is abnormal or we need to change your treatment, we will call you to review the results.   Testing/Procedures: None ordered   Follow-Up: At [facility], you and your health needs are our priority.  As part of our continuing mission to provide you with exceptional heart care, we have created designated Provider Care Teams.  These Care Teams include your primary Cardiologist (physician) and Advanced Practice Providers (APPs -  Physician Assistants and Nurse Practitioners) who all work together to provide you with the care you need, when you need it.  We recommend signing up for the patient portal called "MyChart".  Sign up information is provided on this After Visit Summary.  MyChart is used to connect with patients for Virtual Visits (Telemedicine).  Patients are able to view lab/test results, encounter notes, upcoming appointments, etc.  Non-urgent messages can be sent to your provider as well.   To learn more about what you can do with MyChart, go to [email or web address].    Your next appointment:   1 week(s)  Provider:   Robin Searing, NP       Other Instructions Continue to check your blood pressure and bring your readings with you to your next appointment

## 2022-11-25 ENCOUNTER — Telehealth: Payer: Self-pay | Admitting: Cardiology

## 2022-11-25 ENCOUNTER — Encounter: Payer: HMO | Admitting: Nurse Practitioner

## 2022-11-25 DIAGNOSIS — I10 Essential (primary) hypertension: Secondary | ICD-10-CM

## 2022-11-25 NOTE — Telephone Encounter (Signed)
Calling with bp numbers:  148/99 128/81 135/96 147/90 144/97 137/98

## 2022-11-25 NOTE — Anesthesia Preprocedure Evaluation (Signed)
Anesthesia Evaluation  Patient identified by MRN, date of birth, ID band Patient awake    Reviewed: Allergy & Precautions, H&P , NPO status , Patient's Chart, lab work & pertinent test results  Airway Mallampati: II  TM Distance: >3 FB Neck ROM: Full    Dental no notable dental hx. (+) Dental Advisory Given, Edentulous Upper, Edentulous Lower   Pulmonary shortness of breath, former smoker   Pulmonary exam normal breath sounds clear to auscultation       Cardiovascular hypertension, Pt. on medications and Pt. on home beta blockers +CHF  (-) CAD Normal cardiovascular exam+ Valvular Problems/Murmurs (s/p AVR)  Rhythm:Regular Rate:Normal  '21 ECHO: EF 60-65%. The LV has normal function, no regional wall motion abnormalities, mod LVH, Grade II diastolic dysfunction (pseudonormalization). Elevated left atrial pressure.  2. RVF is mildly reduced. RV size is normal. There is normal pulmonary artery systolic pressure.  3. Left atrial size was severely dilated.  4. The mitral valve has been repaired/replaced. There is a 28 mm prosthetic annuloplasty ring present in the mitral position. Mild to moderate mitral valve regurgitation. Mild mitral stenosis. MG 4 mmHg at HR 62 bpm, MVA 1.9 cm^2 by continuity equation.  5. The aortic valve has been repaired/replaced. There is a 23 mm bioprosthetic valve present in the aortic position. AI is not visualized. Echo findings are consistent with normal structure and function of the AV prosthesis   Neuro/Psych  PSYCHIATRIC DISORDERS Anxiety Depression    negative neurological ROS  negative psych ROS   GI/Hepatic negative GI ROS, Neg liver ROS,,,  Endo/Other  negative endocrine ROSHypothyroidism    Renal/GU Renal diseasenegative Renal ROS  negative genitourinary   Musculoskeletal negative musculoskeletal ROS (+)    Abdominal   Peds negative pediatric ROS (+)  Hematology negative hematology ROS (+)  Blood dyscrasia, anemia   Anesthesia Other Findings   Reproductive/Obstetrics negative OB ROS                             Anesthesia Physical Anesthesia Plan  ASA: 3  Anesthesia Plan: General   Post-op Pain Management: Tylenol PO (pre-op)*, Celebrex PO (pre-op)* and Minimal or no pain anticipated   Induction: Intravenous  PONV Risk Score and Plan: 3 and Ondansetron, Dexamethasone and Scopolamine patch - Pre-op  Airway Management Planned: Oral ETT and LMA  Additional Equipment: None  Intra-op Plan:   Post-operative Plan: Extubation in OR  Informed Consent:   Plan Discussed with: Anesthesiologist and CRNA  Anesthesia Plan Comments:         Anesthesia Quick Evaluation

## 2022-11-26 ENCOUNTER — Encounter (HOSPITAL_BASED_OUTPATIENT_CLINIC_OR_DEPARTMENT_OTHER): Payer: Self-pay | Admitting: Orthopaedic Surgery

## 2022-11-26 ENCOUNTER — Ambulatory Visit (HOSPITAL_BASED_OUTPATIENT_CLINIC_OR_DEPARTMENT_OTHER): Payer: HMO | Admitting: Anesthesiology

## 2022-11-26 ENCOUNTER — Ambulatory Visit (HOSPITAL_BASED_OUTPATIENT_CLINIC_OR_DEPARTMENT_OTHER)
Admission: RE | Admit: 2022-11-26 | Discharge: 2022-11-26 | Disposition: A | Discharge status: Home / Self Care | Payer: HMO | Attending: Orthopaedic Surgery | Admitting: Orthopaedic Surgery

## 2022-11-26 ENCOUNTER — Other Ambulatory Visit: Payer: Self-pay

## 2022-11-26 ENCOUNTER — Ambulatory Visit (HOSPITAL_BASED_OUTPATIENT_CLINIC_OR_DEPARTMENT_OTHER): Payer: HMO

## 2022-11-26 ENCOUNTER — Encounter (HOSPITAL_BASED_OUTPATIENT_CLINIC_OR_DEPARTMENT_OTHER)
Admission: RE | Disposition: A | Discharge status: Home / Self Care | Payer: Self-pay | Source: Home / Self Care | Attending: Orthopaedic Surgery

## 2022-11-26 DIAGNOSIS — I130 Hypertensive heart and chronic kidney disease with heart failure and stage 1 through stage 4 chronic kidney disease, or unspecified chronic kidney disease: Secondary | ICD-10-CM | POA: Insufficient documentation

## 2022-11-26 DIAGNOSIS — D649 Anemia, unspecified: Secondary | ICD-10-CM | POA: Insufficient documentation

## 2022-11-26 DIAGNOSIS — N189 Chronic kidney disease, unspecified: Secondary | ICD-10-CM | POA: Insufficient documentation

## 2022-11-26 DIAGNOSIS — I5042 Chronic combined systolic (congestive) and diastolic (congestive) heart failure: Secondary | ICD-10-CM | POA: Diagnosis not present

## 2022-11-26 DIAGNOSIS — Z79899 Other long term (current) drug therapy: Secondary | ICD-10-CM

## 2022-11-26 DIAGNOSIS — Z87891 Personal history of nicotine dependence: Secondary | ICD-10-CM | POA: Diagnosis not present

## 2022-11-26 DIAGNOSIS — Z4589 Encounter for adjustment and management of other implanted devices: Secondary | ICD-10-CM | POA: Diagnosis not present

## 2022-11-26 DIAGNOSIS — Z952 Presence of prosthetic heart valve: Secondary | ICD-10-CM | POA: Diagnosis not present

## 2022-11-26 DIAGNOSIS — Z472 Encounter for removal of internal fixation device: Secondary | ICD-10-CM

## 2022-11-26 DIAGNOSIS — D759 Disease of blood and blood-forming organs, unspecified: Secondary | ICD-10-CM | POA: Insufficient documentation

## 2022-11-26 DIAGNOSIS — I10 Essential (primary) hypertension: Secondary | ICD-10-CM

## 2022-11-26 DIAGNOSIS — I110 Hypertensive heart disease with heart failure: Secondary | ICD-10-CM | POA: Diagnosis not present

## 2022-11-26 DIAGNOSIS — Z01818 Encounter for other preprocedural examination: Secondary | ICD-10-CM

## 2022-11-26 DIAGNOSIS — I509 Heart failure, unspecified: Secondary | ICD-10-CM

## 2022-11-26 HISTORY — PX: HARDWARE REMOVAL: SHX979

## 2022-11-26 SURGERY — REMOVAL, HARDWARE
Anesthesia: Monitor Anesthesia Care | Site: Foot | Laterality: Right

## 2022-11-26 MED ORDER — MIDAZOLAM HCL 5 MG/5ML IJ SOLN
INTRAMUSCULAR | Status: DC | PRN
  Administered 2022-11-26: 2 mg via INTRAVENOUS

## 2022-11-26 MED ORDER — ACETAMINOPHEN 500 MG PO TABS
ORAL_TABLET | ORAL | Status: AC
  Filled 2022-11-26: qty 2

## 2022-11-26 MED ORDER — HYDRALAZINE HCL 20 MG/ML IJ SOLN
10.0000 mg | Freq: Once | INTRAMUSCULAR | Status: AC
  Administered 2022-11-26: 10 mg via INTRAVENOUS

## 2022-11-26 MED ORDER — LACTATED RINGERS IV SOLN: INTRAVENOUS | Status: DC

## 2022-11-26 MED ORDER — CEFAZOLIN SODIUM-DEXTROSE 2-4 GM/100ML-% IV SOLN
INTRAVENOUS | Status: AC
  Filled 2022-11-26: qty 100

## 2022-11-26 MED ORDER — 0.9 % SODIUM CHLORIDE (POUR BTL) OPTIME
TOPICAL | Status: DC | PRN
  Administered 2022-11-26: 200 mL

## 2022-11-26 MED ORDER — ONDANSETRON HCL 4 MG/2ML IJ SOLN
INTRAMUSCULAR | Status: AC
  Filled 2022-11-26: qty 2

## 2022-11-26 MED ORDER — MIDAZOLAM HCL 2 MG/2ML IJ SOLN
INTRAMUSCULAR | Status: AC
  Filled 2022-11-26: qty 2

## 2022-11-26 MED ORDER — FENTANYL CITRATE (PF) 100 MCG/2ML IJ SOLN: 25.0000 ug | INTRAMUSCULAR | Status: DC | PRN

## 2022-11-26 MED ORDER — HYDRALAZINE HCL 20 MG/ML IJ SOLN
INTRAMUSCULAR | Status: AC
  Filled 2022-11-26: qty 1

## 2022-11-26 MED ORDER — CELECOXIB 200 MG PO CAPS
200.0000 mg | ORAL_CAPSULE | Freq: Once | ORAL | Status: AC
  Administered 2022-11-26: 200 mg via ORAL

## 2022-11-26 MED ORDER — MEPERIDINE HCL 25 MG/ML IJ SOLN: 6.2500 mg | INTRAMUSCULAR | Status: DC | PRN

## 2022-11-26 MED ORDER — VANCOMYCIN HCL 500 MG IV SOLR
INTRAVENOUS | Status: DC | PRN
  Administered 2022-11-26: 500 mg via TOPICAL

## 2022-11-26 MED ORDER — ACETAMINOPHEN 160 MG/5ML PO SOLN: 325.0000 mg | ORAL | Status: DC | PRN

## 2022-11-26 MED ORDER — BUPIVACAINE HCL 0.25 % IJ SOLN
INTRAMUSCULAR | Status: DC | PRN
  Administered 2022-11-26: 10 mL

## 2022-11-26 MED ORDER — ONDANSETRON HCL 4 MG/2ML IJ SOLN: 4.0000 mg | Freq: Once | INTRAMUSCULAR | Status: DC | PRN

## 2022-11-26 MED ORDER — FENTANYL CITRATE (PF) 100 MCG/2ML IJ SOLN
INTRAMUSCULAR | Status: AC
  Filled 2022-11-26: qty 2

## 2022-11-26 MED ORDER — OXYCODONE HCL 5 MG/5ML PO SOLN: 5.0000 mg | Freq: Once | ORAL | Status: DC | PRN

## 2022-11-26 MED ORDER — CEFAZOLIN SODIUM-DEXTROSE 2-4 GM/100ML-% IV SOLN
2.0000 g | INTRAVENOUS | Status: AC
  Administered 2022-11-26: 2 g via INTRAVENOUS

## 2022-11-26 MED ORDER — OXYCODONE HCL 5 MG PO TABS: 5.0000 mg | ORAL_TABLET | Freq: Once | ORAL | Status: DC | PRN

## 2022-11-26 MED ORDER — ONDANSETRON HCL 4 MG/2ML IJ SOLN
INTRAMUSCULAR | Status: DC | PRN
  Administered 2022-11-26: 4 mg via INTRAVENOUS

## 2022-11-26 MED ORDER — PROPOFOL 10 MG/ML IV BOLUS
INTRAVENOUS | Status: DC | PRN
  Administered 2022-11-26 (×3): 10 mg via INTRAVENOUS
  Administered 2022-11-26: 30 mg via INTRAVENOUS

## 2022-11-26 MED ORDER — ACETAMINOPHEN 500 MG PO TABS
1000.0000 mg | ORAL_TABLET | Freq: Once | ORAL | Status: AC
  Administered 2022-11-26: 1000 mg via ORAL

## 2022-11-26 MED ORDER — PROPOFOL 500 MG/50ML IV EMUL
INTRAVENOUS | Status: DC | PRN
  Administered 2022-11-26: 75 ug/kg/min via INTRAVENOUS

## 2022-11-26 MED ORDER — LIDOCAINE 2% (20 MG/ML) 5 ML SYRINGE
INTRAMUSCULAR | Status: AC
  Filled 2022-11-26: qty 5

## 2022-11-26 MED ORDER — ACETAMINOPHEN 325 MG PO TABS: 325.0000 mg | ORAL_TABLET | ORAL | Status: DC | PRN

## 2022-11-26 MED ORDER — CELECOXIB 200 MG PO CAPS
ORAL_CAPSULE | ORAL | Status: AC
  Filled 2022-11-26: qty 1

## 2022-11-26 MED ORDER — FENTANYL CITRATE (PF) 100 MCG/2ML IJ SOLN
INTRAMUSCULAR | Status: DC | PRN
  Administered 2022-11-26: 50 ug via INTRAVENOUS

## 2022-11-26 MED ORDER — LIDOCAINE HCL (CARDIAC) PF 100 MG/5ML IV SOSY
PREFILLED_SYRINGE | INTRAVENOUS | Status: DC | PRN
  Administered 2022-11-26: 40 mg via INTRAVENOUS

## 2022-11-26 SURGICAL SUPPLY — 68 items
APL PRP STRL LF DISP 70% ISPRP (MISCELLANEOUS) ×1
BANDAGE ESMARK 6X9 LF (GAUZE/BANDAGES/DRESSINGS) ×1 IMPLANT
BLADE SURG 15 STRL LF DISP TIS (BLADE) ×4 IMPLANT
BLADE SURG 15 STRL SS (BLADE) ×2
BNDG CMPR 5X4 CHSV STRCH STRL (GAUZE/BANDAGES/DRESSINGS) ×1
BNDG CMPR 5X4 KNIT ELC UNQ LF (GAUZE/BANDAGES/DRESSINGS) ×1
BNDG CMPR 5X62 HK CLSR LF (GAUZE/BANDAGES/DRESSINGS)
BNDG CMPR 6"X 5 YARDS HK CLSR (GAUZE/BANDAGES/DRESSINGS)
BNDG CMPR 9X6 STRL LF SNTH (GAUZE/BANDAGES/DRESSINGS) ×1
BNDG COHESIVE 4X5 TAN STRL LF (GAUZE/BANDAGES/DRESSINGS) ×1 IMPLANT
BNDG ELASTIC 4INX 5YD STR LF (GAUZE/BANDAGES/DRESSINGS) ×1 IMPLANT
BNDG ELASTIC 6INX 5YD STR LF (GAUZE/BANDAGES/DRESSINGS) ×1 IMPLANT
BNDG ESMARK 6X9 LF (GAUZE/BANDAGES/DRESSINGS) ×1
BOOT STEPPER DURA MED (SOFTGOODS) IMPLANT
BRUSH SCRUB EZ  4% CHG (MISCELLANEOUS) ×1
BRUSH SCRUB EZ 4% CHG (MISCELLANEOUS) ×1 IMPLANT
CANISTER SUCT 1200ML W/VALVE (MISCELLANEOUS) ×1 IMPLANT
CHLORAPREP W/TINT 26 (MISCELLANEOUS) ×2 IMPLANT
COVER BACK TABLE 60X90IN (DRAPES) ×1 IMPLANT
CUFF TOURN SGL QUICK 34 (TOURNIQUET CUFF)
CUFF TRNQT CYL 34X4.125X (TOURNIQUET CUFF) IMPLANT
DRAPE C-ARM 42X72 X-RAY (DRAPES) ×1 IMPLANT
DRAPE C-ARMOR (DRAPES) ×1 IMPLANT
DRAPE EXTREMITY T 121X128X90 (DISPOSABLE) ×1 IMPLANT
DRAPE IMP U-DRAPE 54X76 (DRAPES) ×1 IMPLANT
DRAPE U-SHAPE 47X51 STRL (DRAPES) ×1 IMPLANT
DRSG MEPITEL 4X7.2 (GAUZE/BANDAGES/DRESSINGS) ×1 IMPLANT
ELECT REM PT RETURN 9FT ADLT (ELECTROSURGICAL) ×1
ELECTRODE REM PT RTRN 9FT ADLT (ELECTROSURGICAL) ×1 IMPLANT
GAUZE PAD ABD 8X10 STRL (GAUZE/BANDAGES/DRESSINGS) IMPLANT
GAUZE SPONGE 4X4 12PLY STRL (GAUZE/BANDAGES/DRESSINGS) ×1 IMPLANT
GLOVE BIOGEL PI IND STRL 8 (GLOVE) ×1 IMPLANT
GLOVE SURG SS PI 7.5 STRL IVOR (GLOVE) ×2 IMPLANT
GOWN STRL REUS W/ TWL LRG LVL3 (GOWN DISPOSABLE) ×2 IMPLANT
GOWN STRL REUS W/TWL LRG LVL3 (GOWN DISPOSABLE) ×2
MARKER SKIN DUAL TIP RULER LAB (MISCELLANEOUS) IMPLANT
NDL HYPO 25X1 1.5 SAFETY (NEEDLE) IMPLANT
NEEDLE HYPO 25X1 1.5 SAFETY (NEEDLE) IMPLANT
NS IRRIG 1000ML POUR BTL (IV SOLUTION) ×1 IMPLANT
PACK BASIN DAY SURGERY FS (CUSTOM PROCEDURE TRAY) ×1 IMPLANT
PAD CAST 4YDX4 CTTN HI CHSV (CAST SUPPLIES) ×1 IMPLANT
PADDING CAST ABS COTTON 4X4 ST (CAST SUPPLIES) IMPLANT
PADDING CAST COTTON 4X4 STRL (CAST SUPPLIES)
PADDING CAST COTTON 6X4 STRL (CAST SUPPLIES) ×1 IMPLANT
PADDING CAST SYNTHETIC 4X4 STR (CAST SUPPLIES) ×4 IMPLANT
PENCIL SMOKE EVACUATOR (MISCELLANEOUS) ×1 IMPLANT
SHEET MEDIUM DRAPE 40X70 STRL (DRAPES) ×1 IMPLANT
SLEEVE SCD COMPRESS KNEE MED (STOCKING) ×1 IMPLANT
SPIKE FLUID TRANSFER (MISCELLANEOUS) IMPLANT
SPLINT PLASTER CAST FAST 5X30 (CAST SUPPLIES) IMPLANT
SPONGE T-LAP 18X18 ~~LOC~~+RFID (SPONGE) ×1 IMPLANT
STOCKINETTE 6  STRL (DRAPES) ×1
STOCKINETTE 6 STRL (DRAPES) ×1 IMPLANT
STOCKINETTE ORTHO 6X25 (MISCELLANEOUS) ×1 IMPLANT
SUCTION FRAZIER HANDLE 10FR (MISCELLANEOUS)
SUCTION TUBE FRAZIER 10FR DISP (MISCELLANEOUS) IMPLANT
SUT ETHILON 2 0 FS 18 (SUTURE) ×2 IMPLANT
SUT MNCRL AB 3-0 PS2 18 (SUTURE) ×1 IMPLANT
SUT VIC AB 2-0 SH 27 (SUTURE) ×1
SUT VIC AB 2-0 SH 27XBRD (SUTURE) ×1 IMPLANT
SUT VIC AB 3-0 SH 27 (SUTURE)
SUT VIC AB 3-0 SH 27X BRD (SUTURE) IMPLANT
SUT VICRYL 0 SH 27 (SUTURE) IMPLANT
SYR BULB IRRIG 60ML STRL (SYRINGE) ×1 IMPLANT
SYR CONTROL 10ML LL (SYRINGE) IMPLANT
TOWEL GREEN STERILE FF (TOWEL DISPOSABLE) ×2 IMPLANT
TUBE CONNECTING 20X1/4 (TUBING) ×1 IMPLANT
UNDERPAD 30X36 HEAVY ABSORB (UNDERPADS AND DIAPERS) ×1 IMPLANT

## 2022-11-26 NOTE — Transfer of Care (Signed)
Immediate Anesthesia Transfer of Care Note  Patient: Miranda [name]  Procedure(s) Performed: Right ankle removal of hardware (syndesmotic screws x2) (Right: Foot)  Patient Location: PACU  Anesthesia Type:MAC  Level of Consciousness: drowsy and patient cooperative  Airway & Oxygen Therapy: Patient Spontanous Breathing and Patient connected to face mask oxygen  Post-op Assessment: Report given to RN and Post -op Vital signs reviewed and stable  Post vital signs: Reviewed and stable  Last Vitals:  Vitals Value Taken Time  BP    Temp    Pulse    Resp    SpO2      Last Pain:  Vitals:   11/26/22 0715  TempSrc: Oral  PainSc: 0-No pain      Patients Stated Pain Goal: 5 (11/26/22 0715)  Complications: No notable events documented.

## 2022-11-26 NOTE — H&P (Signed)
H&P Update:  -History and Physical Reviewed  -Patient has been re-examined  -No change in the plan of care  -The risks and benefits were presented and reviewed. The risks due to total/partial inability to removal part/all of hardware, hardware/suture failure and/or irritation, new/persistent infection, stiffness, nerve/vessel/tendon injury or rerupture of repaired tendon, nonunion/malunion, allograft usage, wound healing issues, development of arthritis, failure of this surgery, possibility of external fixation with delayed definitive surgery, need for further surgery, thromboembolic events, anesthesia/medical complications, amputation, death among others were discussed. The patient acknowledged the explanation, agreed to proceed with the plan and a consent was signed.  Miranda [name]

## 2022-11-26 NOTE — Anesthesia Postprocedure Evaluation (Signed)
Anesthesia Post Note  Patient: Miranda [name]  Procedure(s) Performed: Right ankle removal of hardware (syndesmotic screws x2) (Right: Foot)     Patient location during evaluation: PACU Anesthesia Type: MAC Level of consciousness: awake and alert Pain management: pain level controlled Vital Signs Assessment: post-procedure vital signs reviewed and stable Respiratory status: spontaneous breathing, nonlabored ventilation, respiratory function stable and patient connected to nasal cannula oxygen Cardiovascular status: stable and blood pressure returned to baseline Postop Assessment: no apparent nausea or vomiting Anesthetic complications: no   No notable events documented.  Last Vitals:  Vitals:   11/26/22 1015 11/26/22 1028  BP: (!) 170/104 (!) 156/79  Pulse: 60   Resp:    Temp:    SpO2: 99%     Last Pain:  Vitals:   11/26/22 0955  TempSrc:   PainSc: 0-No pain                 Earleen Aoun

## 2022-11-26 NOTE — Telephone Encounter (Signed)
Left message for patient informing her of Robin Searing, NP's response:  Please let Miranda [name] know that her blood pressures have improved however the bottom number is still markedly elevated.  We will grant clearance for her upcoming procedure and please refer patient to the hypertension clinic for assistance in titrating medications.  Robin Searing, NP   Referral to HTN Clinic placed, scheduler to call patient to schedule appt.  Provided office number for callback if any questions.

## 2022-11-26 NOTE — Anesthesia Procedure Notes (Signed)
Procedure Name: MAC Date/Time: 11/26/2022 8:36 AM  Performed by: Thornell Mule, CRNAOxygen Delivery Method: Simple face mask

## 2022-11-27 ENCOUNTER — Encounter (HOSPITAL_BASED_OUTPATIENT_CLINIC_OR_DEPARTMENT_OTHER): Payer: Self-pay | Admitting: Orthopaedic Surgery

## 2022-11-27 NOTE — Op Note (Addendum)
11/26/2022  1:36 PM   PATIENT: Miranda [name]  57 y.o. female  MRN: [id number]   PRE-OPERATIVE DIAGNOSIS:   S/p Right ankle complete ex fix removal, open reduction internal fixation, syndesmosis fixation   POST-OPERATIVE DIAGNOSIS:   Same   PROCEDURE: Right ankle removal of deep hardware (syndesmotic screws x2)   SURGEON:  Netta Cedars, MD   ASSISTANT: None   ANESTHESIA: General, regional   EBL: Minimal   TOURNIQUET:   None used   COMPLICATIONS: None apparent   DISPOSITION: Extubated, awake and stable to recovery.   INDICATION FOR PROCEDURE: The patient presented with above diagnosis.  We discussed the diagnosis, alternative treatment options, risks and benefits of the above surgical intervention, as well as alternative non-operative treatments. All questions/concerns were addressed and the patient/family demonstrated appropriate understanding of the diagnosis, the procedure, the postoperative course, and overall prognosis. The patient wished to proceed with surgical intervention and signed an informed surgical consent as such, in each others presence prior to surgery.   PROCEDURE IN DETAIL: After preoperative consent was obtained and the correct operative site was identified, the patient was brought to the operating room supine on stretcher and transferred onto operating table. General anesthesia was induced. Preoperative antibiotics were administered. Surgical timeout was taken. The patient was then positioned supine with an ipsilateral hip bump. The operative lower extremity was prepped and draped in standard sterile fashion with a tourniquet around the thigh. However, this was not used.  The prior surgical scar was used to approach the lateral ankle directly over the syndesmosis screw head. Dissection was carefully carried down to the screw. The appropriate screwdriver was used to remove the screw and this came out completely without any issues. The screw  hole was curetted.   A manual external rotation stress radiograph was obtained and demonstrated symmetry of the ankle mortise with complete stability to testing.   The surgical sites were thoroughly irrigated and vancomycin powder applied. Hemostasis achieved. The deep layers were closed using 2-0 vicryl. The skin was closed without tension using 2-0 nylon suture.    The leg was cleaned with saline and sterile dressings with gauze were applied. A well padded bulky compressive wrap and CAM boot were applied. The patient was awakened from anesthesia and transported to the recovery room in stable condition.    FOLLOW UP PLAN: -transfer to PACU, then home -protected CAM boot weightbearing operative extremity, maximum elevation -DVT ppx: Aspirin 81 mg twice daily x 4 wk -follow up as outpatient within 7-10 days for wound check -sutures out in 3 weeks in outpatient office   RADIOGRAPHS: AP, lateral, oblique and stress radiographs of the right ankle were obtained intraoperatively. These showed interval removal of syndesmotic screws. Manual stress radiographs were taken and the ankle mortise and tibiofibular relationship were noted to be stable following fixation. All remaining hardware is appropriately positioned and of the appropriate lengths. No other acute injuries are noted.   Netta Cedars [facility]

## 2022-11-28 ENCOUNTER — Ambulatory Visit (INDEPENDENT_AMBULATORY_CARE_PROVIDER_SITE_OTHER): Payer: PPO | Admitting: Nurse Practitioner

## 2022-11-28 ENCOUNTER — Encounter: Payer: Self-pay | Admitting: Nurse Practitioner

## 2022-11-28 VITALS — BP 148/92 | HR 83 | Resp 20 | Ht 65.0 in | Wt 140.0 lb

## 2022-11-28 DIAGNOSIS — Z1212 Encounter for screening for malignant neoplasm of rectum: Secondary | ICD-10-CM

## 2022-11-28 DIAGNOSIS — E2839 Other primary ovarian failure: Secondary | ICD-10-CM

## 2022-11-28 DIAGNOSIS — Z1211 Encounter for screening for malignant neoplasm of colon: Secondary | ICD-10-CM | POA: Diagnosis not present

## 2022-11-28 DIAGNOSIS — Z1231 Encounter for screening mammogram for malignant neoplasm of breast: Secondary | ICD-10-CM | POA: Diagnosis not present

## 2022-11-28 DIAGNOSIS — Z0000 Encounter for general adult medical examination without abnormal findings: Secondary | ICD-10-CM

## 2022-11-28 NOTE — Progress Notes (Signed)
Subjective:   Miranda [name] is a 57 y.o. female who presents for Medicare Annual (Subsequent) preventive examination.  Review of Systems     Cardiac Risk Factors include: advanced age (>29men, >66 women)     Objective:    Today's Vitals   11/28/22 1022 11/28/22 1039  BP: (!) 148/96 (!) 148/92  Pulse: 83   Resp: 20   SpO2: 98%   Weight: 140 lb (63.5 kg)   Height: 5\' 5"  (1.651 m)   PainSc: 4     Body mass index is 23.3 kg/m.     11/28/2022   10:32 AM 11/26/2022    7:09 AM 11/19/2022   10:14 AM 10/17/2022    9:44 AM 09/19/2022    9:50 AM 04/24/2022   11:24 AM 11/21/2021   10:49 AM  Advanced Directives  Does Patient Have a Medical Advance Directive? No No No No No No Yes  Does patient want to make changes to medical advance directive?       No - Patient declined  Copy of Healthcare Power of Attorney in Chart? No - copy requested No - copy requested No - copy requested      Would patient like information on creating a medical advance directive?   No - Patient declined No - Patient declined No - Patient declined No - Patient declined     Current Medications (verified) Outpatient Encounter Medications as of 11/28/2022  Medication Sig   acetaminophen (TYLENOL) 500 MG tablet Take 1,000 mg by mouth every 6 (six) hours as needed for mild pain.   aspirin EC 81 MG EC tablet Take 1 tablet (81 mg total) by mouth daily.   brexpiprazole (REXULTI) 1 MG TABS tablet Take 1 tablet (1 mg total) by mouth daily.   carvedilol (COREG) 12.5 MG tablet Take 1 tablet (12.5 mg total) by mouth 2 (two) times daily.   Cholecalciferol (D3) 50 MCG (2000 UT) TABS Take 2,000 Units by mouth daily.   cloNIDine (CATAPRES) 0.1 MG tablet Take 1 tablet (0.1 mg total) by mouth daily as needed.   ferrous sulfate 325 (65 FE) MG EC tablet Take 1 tablet (325 mg total) by mouth 2 (two) times daily.   fluticasone (FLONASE) 50 MCG/ACT nasal spray Place 1 spray into both nostrils 2 (two) times daily as needed for  allergies or rhinitis.   furosemide (LASIX) 40 MG tablet Take 1 tablet by mouth once daily   levothyroxine (SYNTHROID) 100 MCG tablet TAKE 1 TABLET BY MOUTH ONCE DAILY BEFORE BREAKFAST   lisinopril (ZESTRIL) 5 MG tablet TAKE 1 TABLET BY MOUTH AT BEDTIME . APPOINTMENT REQUIRED FOR FUTURE REFILLS   melatonin 1 MG TABS tablet Take 1 mg by mouth at bedtime.   ondansetron (ZOFRAN) 4 MG tablet Take 4 mg by mouth 2 (two) times daily.   oxyCODONE (OXY IR/ROXICODONE) 5 MG immediate release tablet Take 5 mg by mouth every 6 (six) hours as needed.   rosuvastatin (CRESTOR) 40 MG tablet Take 1 tablet by mouth once daily   traZODone (DESYREL) 100 MG tablet Take 2 tablets (200 mg total) by mouth at bedtime.   No facility-administered encounter medications on file as of 11/28/2022.    Allergies (verified) Codeine and Wellbutrin [bupropion]   History: Past Medical History:  Diagnosis Date   Anemia    Anxiety    At risk for sudden cardiac death 2012/11/17   CHF - Combined Systolic + Diastolic Dysfunction. EF of 15-20% with Grade II diastolic dysfunction on echo 10/27/12  10/28/2012   CKD (chronic kidney disease)    Coronary artery disease    Depression    .   Dyspnea    none per pt on 04/23/22   Fibroids    Heart murmur    History of blood transfusion 2014   Hypertension    Hypothyroidism    S/P cardiac catheterization, 10/29/12, normal coronaries with minimal luminal irregularities in RCA system 10/30/2012   Thyroid disease    hypothyroidism   Past Surgical History:  Procedure Laterality Date   AORTIC VALVE REPLACEMENT N/A 03/12/2017   Procedure: AORTIC VALVE REPLACEMENT (AVR) with Mitral Annuloplasty;  Surgeon: Alleen Borne, MD;  Location: [facility] OR;  Service: Open Heart Surgery;  Laterality: N/A;   EXTERNAL FIXATION LEG Right 04/24/2022   Procedure: EXTERNAL FIXATION RIGHT LEG;  Surgeon: Netta Cedars, MD;  Location: [facility] OR;  Service: Orthopedics;  Laterality: Right;   EXTERNAL FIXATION  REMOVAL Right 05/07/2022   Procedure: REMOVAL EXTERNAL FIXATION LEG right;  Surgeon: Netta Cedars, MD;  Location: [facility] OR;  Service: Orthopedics;  Laterality: Right;  120 min   HARDWARE REMOVAL Right 11/26/2022   Procedure: Right ankle removal of hardware (syndesmotic screws x2);  Surgeon: Netta Cedars, MD;  Location: [facility];  Service: Orthopedics;  Laterality: Right;  60   LEFT AND RIGHT HEART CATHETERIZATION WITH CORONARY ANGIOGRAM N/A 10/29/2012   Procedure: LEFT AND RIGHT HEART CATHETERIZATION WITH CORONARY ANGIOGRAM;  Surgeon: Marykay Lex, MD;  Location: [facility] CATH LAB;  Service: Cardiovascular;  Laterality: N/A;   MITRAL VALVE REPAIR N/A 03/12/2017   Procedure: MITRAL VALVE REPAIR (MVR);  Surgeon: Alleen Borne, MD;  Location: [facility] OR;  Service: Open Heart Surgery;  Laterality: N/A;  mitral annuloplasty   MULTIPLE EXTRACTIONS WITH ALVEOLOPLASTY N/A 01/21/2017   Procedure: Extraction of tooth #'s 4-13 with alveoloplasty;  Surgeon: Charlynne Pander, DDS;  Location[facility] OR;  Service: Oral Surgery;  Laterality: N/A;   ORIF ANKLE FRACTURE Right 05/07/2022   Procedure: OPEN REDUCTION INTERNAL FIXATION (ORIF)right  ANKLE FRACTURE possible syndesmosis and/or deltoid fixation, possible allograft;  Surgeon: Netta Cedars, MD;  Location: [facility] OR;  Service: Orthopedics;  Laterality: Right;  120 min   RIGHT/LEFT HEART CATH AND CORONARY ANGIOGRAPHY N/A 01/09/2017   Procedure: Right/Left Heart Cath and Coronary Angiography;  Surgeon: Lennette Bihari, MD;  Location: [facility] INVASIVE CV LAB;  Service: Cardiovascular;  Laterality: N/A;   TEE WITHOUT CARDIOVERSION N/A 03/12/2017   Procedure: TRANSESOPHAGEAL ECHOCARDIOGRAM (TEE);  Surgeon: Alleen Borne, MD;  Location: [facility] OR;  Service: Open Heart Surgery;  Laterality: N/A;   Family History  Problem Relation Age of Onset   Stroke Mother    Hypertension Mother    Liver disease Mother        autoimmune   Heart disease Mother    Colon  polyps Mother    Stroke Father    Hypertension Father    Hypertension Maternal Grandmother    Heart disease Maternal Grandmother    Bone cancer Paternal Grandfather    Bone cancer Maternal Uncle    Arthritis Paternal Uncle    Breast cancer Cousin    Social History   Socioeconomic History   Marital status: Single    Spouse name: Not on file   Number of children: 1   Years of education: 12th   Highest education level: Not on file  Occupational History   Occupation: unemployed    Employer: [employer]  Tobacco Use   Smoking status:  Former    Packs/day: 0.50    Years: 5.00    Additional pack years: 0.00    Total pack years: 2.50    Types: Cigars, Cigarettes    Quit date: 08/04/1978    Years since quitting: 44.3   Smokeless tobacco: Never   Tobacco comments:    1 cigar daily-quit 10/24/12    Vaping Use   Vaping Use: Never used  Substance and Sexual Activity   Alcohol use: Yes    Alcohol/week: 5.0 standard drinks of alcohol    Types: 2 Glasses of wine, 3 Cans of beer per week    Comment: Either wine or Beer, not both   Drug use: Not Currently    Frequency: 5.0 times per week    Types: Marijuana    Comment: none since 2015   Sexual activity: Not Currently    Birth control/protection: Post-menopausal  Other Topics Concern   Not on file  Social History Narrative   Patient lives at home alone. (Condo)   Caffeine use: 1/2 soda daily   Diet   Martial status:single   Is it one or more stories? Yes   How many persons live in your home? 1   Do you have any pets in your home? No   Current or past profession: Company secretary   Do you exercise? No   Do you have a living will? No    Do you have a DNR form? No    Do you have signed POA/HPOA forms? No    Social Determinants of Health   Financial Resource Strain: Low Risk  (11/06/2017)   Overall Financial Resource Strain (CARDIA)    Difficulty of Paying Living Expenses: Not hard at all  Food Insecurity: No Food  Insecurity (11/22/2019)   Hunger Vital Sign    Worried About Running Out of Food in the Last Year: Never true    Ran Out of Food in the Last Year: Never true  Transportation Needs: No Transportation Needs (11/22/2019)   PRAPARE - Administrator, Civil Service (Medical): No    Lack of Transportation (Non-Medical): No  Physical Activity: Insufficiently Active (11/06/2017)   Exercise Vital Sign    Days of Exercise per Week: 5 days    Minutes of Exercise per Session: 20 min  Stress: Stress Concern Present (11/06/2017)   [employer] of Occupational Health - Occupational Stress Questionnaire    Feeling of Stress : To some extent  Social Connections: Moderately Isolated (11/06/2017)   Social Connection and Isolation Panel [NHANES]    Frequency of Communication with Friends and Family: More than three times a week    Frequency of Social Gatherings with Friends and Family: More than three times a week    Attends Religious Services: Never    Database administrator or Organizations: No    Attends Banker Meetings: Never    Marital Status: Never married    Tobacco Counseling Counseling given: Not Answered Tobacco comments: 1 cigar daily-quit 10/24/12     Clinical Intake:  Pre-visit preparation completed: Yes  Pain : 0-10 Pain Score: 4  Pain Location: Ankle (Due to surgery per pt) Pain Orientation: Right Pain Descriptors / Indicators: Burning Pain Onset: 1 to 4 weeks ago Pain Frequency: Intermittent     BMI - recorded: 23.33 Nutritional Status: BMI of 19-24  Normal Nutritional Risks: None Diabetes: No  How often do you need to have someone help you when you read instructions, pamphlets, or other  written materials from your doctor or pharmacy?: 1 - Never What is the last grade level you completed in school?: 12th grade  Diabetic?no  Interpreter Needed?: No  Information entered by :: Porsha McClurkin,CMA   Activities of Daily Living    11/28/2022    10:33 AM 11/26/2022    7:17 AM  In your present state of health, do you have any difficulty performing the following activities:  Hearing? 0 0  Vision? 0 0  Difficulty concentrating or making decisions? 0 0  Walking or climbing stairs? 0 0  Dressing or bathing? 0 0  Doing errands, shopping? 0   Preparing Food and eating ? N   Using the Toilet? N   In the past six months, have you accidently leaked urine? N   Do you have problems with loss of bowel control? N   Managing your Medications? N   Managing your Finances? N   Housekeeping or managing your Housekeeping? N     Patient Care Team: Sharon Seller, NP as PCP - General (Geriatric Medicine) Jake Bathe, MD as PCP - Cardiology (Cardiology) Allie Bossier, MD as Consulting Physician (Obstetrics and Gynecology) Gala Romney, Bevelyn Buckles, MD as Consulting Physician (Cardiology) Suanne Marker, MD as Consulting Physician (Neurology)  Indicate any recent Medical Services you may have received from other than [facility] providers in the past year (date may be approximate).     Assessment:   This is a routine wellness examination for Talaysia.  Hearing/Vision screen Hearing Screening - Comments:: No concerns hearing/ hearing aids Vision Screening - Comments:: No concerns/wear glasses  Dietary issues and exercise activities discussed: Current Exercise Habits: The patient does not participate in regular exercise at present   Goals Addressed   None   Depression Screen    11/28/2022   10:33 AM 11/21/2021   10:43 AM 10/25/2021    9:08 AM 11/13/2020    9:34 AM 09/03/2020    8:39 AM 11/22/2019   10:36 AM 11/11/2019    9:01 AM  PHQ 2/9 Scores  PHQ - 2 Score 0 3 0 0 0 0 0  PHQ- 9 Score  8       Exception Documentation   Other- indicate reason in comment box      Not completed   Connected with someone from [facility]        Fall Risk    11/28/2022   10:33 AM 10/17/2022    9:44 AM 09/19/2022   10:09 AM 05/19/2022   10:16 AM  04/21/2022    9:16 AM  Fall Risk   Falls in the past year? 1 1 1  0 1  Number falls in past yr: 0 0 0 0 0  Injury with Fall? 1 1 1  0 1  Risk for fall due to : History of fall(s) History of fall(s) History of fall(s) No Fall Risks No Fall Risks  Follow up Falls evaluation completed Falls evaluation completed Falls evaluation completed      FALL RISK PREVENTION PERTAINING TO THE HOME:  Any stairs in or around the home? Yes  If so, are there any without handrails? No  Home free of loose throw rugs in walkways, pet beds, electrical cords, etc? Yes  Adequate lighting in your home to reduce risk of falls? Yes   ASSISTIVE DEVICES UTILIZED TO PREVENT FALLS:  Life alert? No  Use of a cane, walker or w/c? Yes  Grab bars in the bathroom? Yes  Shower chair or bench in  shower? No  Elevated toilet seat or a handicapped toilet? No   TIMED UP AND GO:  Was the test performed? No .     Cognitive Function:    11/28/2022   10:34 AM 11/03/2016    9:34 AM  MMSE - Mini Mental State Exam  Not completed:  Unable to complete  Orientation to time 5   Orientation to Place 5   Registration 3   Attention/ Calculation 5   Recall 3   Language- name 2 objects 2   Language- repeat 1   Language- follow 3 step command 3   Language- read & follow direction 1   Write a sentence 1   Copy design 1   Total score 30         11/21/2021   10:49 AM 11/13/2020    9:37 AM 11/11/2019    9:03 AM 11/09/2018   10:30 AM  6CIT Screen  What Year? 0 points 0 points 0 points 0 points  What month? 0 points 0 points 0 points 0 points  What time? 0 points 0 points 0 points 0 points  Count back from 20 0 points 0 points 0 points 0 points  Months in reverse 0 points 0 points 0 points 0 points  Repeat phrase 0 points 0 points 0 points 4 points  Total Score 0 points 0 points 0 points 4 points    Immunizations Immunization History  Administered Date(s) Administered   Hep A / Hep B 09/08/2017, 10/09/2017, 03/11/2018    Influenza, High Dose Seasonal PF 05/24/2018   Influenza,inj,Quad PF,6+ Mos 05/26/2016, 05/07/2017, 06/13/2019, 04/19/2020, 04/22/2021, 04/21/2022   PFIZER(Purple Top)SARS-COV-2 Vaccination 10/20/2019, 11/10/2019, 05/26/2020, 11/05/2020   Pfizer Covid-19 Vaccine Bivalent Booster 83yrs & up 10/16/2021   Pneumococcal Conjugate-13 12/14/2014   Pneumococcal Polysaccharide-23 01/30/2017   Tdap 01/25/2016   Zoster Recombinat (Shingrix) 11/06/2017, 01/28/2018    TDAP status: Up to date  Flu Vaccine status: Up to date  Pneumococcal vaccine status: Up to date  Covid-19 vaccine status: Information provided on how to obtain vaccines.   Qualifies for Shingles Vaccine? Yes   Zostavax completed No   Shingrix Completed?: Yes  Screening Tests Health Maintenance  Topic Date Due   MAMMOGRAM  09/01/2021   COVID-19 Vaccine (6 - 2023-24 season) 04/04/2022   Fecal DNA (Cologuard)  09/14/2022   INFLUENZA VACCINE  03/05/2023   Medicare Annual Wellness (AWV)  11/28/2023   PAP SMEAR-Modifier  10/16/2025   DTaP/Tdap/Td (2 - Td or Tdap) 01/24/2026   Hepatitis C Screening  Completed   HIV Screening  Completed   Zoster Vaccines- Shingrix  Completed   HPV VACCINES  Aged Out    Health Maintenance  Health Maintenance Due  Topic Date Due   MAMMOGRAM  09/01/2021   COVID-19 Vaccine (6 - 2023-24 season) 04/04/2022   Fecal DNA (Cologuard)  09/14/2022    Colorectal cancer screening: Type of screening: Cologuard. Completed 2021. Repeat every 3 years  Mammogram status: Ordered today. Pt provided with contact info and advised to call to schedule appt.   Bone Density status: Ordered today. Pt provided with contact info and advised to call to schedule appt.  Lung Cancer Screening: (Low Dose CT Chest recommended if Age 64-80 years, 30 pack-year currently smoking OR have quit w/in 15years.) does not qualify.   Lung Cancer Screening Referral: na  Additional Screening:  Hepatitis C Screening: does qualify;  Completed 2019  Vision Screening: Recommended annual ophthalmology exams for early detection of glaucoma and other  disorders of the eye. Is the patient up to date with their annual eye exam?  Yes  Who is the provider or what is the name of the office in which the patient attends annual eye exams? [facility] If pt is not established with a provider, would they like to be referred to a provider to establish care? No .   Dental Screening: Recommended annual dental exams for proper oral hygiene  Community Resource Referral / Chronic Care Management: CRR required this visit?  No   CCM required this visit?  No      Plan:     I have personally reviewed and noted the following in the patient's chart:   Medical and social history Use of alcohol, tobacco or illicit drugs  Current medications and supplements including opioid prescriptions. Patient is not currently taking opioid prescriptions. Functional ability and status Nutritional status Physical activity Advanced directives List of other physicians Hospitalizations, surgeries, and ER visits in previous 12 months Vitals Screenings to include cognitive, depression, and falls Referrals and appointments  In addition, I have reviewed and discussed with patient certain preventive protocols, quality metrics, and best practice recommendations. A written personalized care plan for preventive services as well as general preventive health recommendations were provided to patient.     Sharon Seller, NP   11/28/2022   Place of service: [facility]

## 2022-11-28 NOTE — Patient Instructions (Signed)
To call [phone]   Miranda [name] , Thank you for taking time to come for your Medicare Wellness Visit. I appreciate your ongoing commitment to your health goals. Please review the following plan we discussed and let me know if I can assist you in the future.   These are the goals we discussed:  Goals       maintain lifestyle (pt-stated)      Starting 11/03/2016 I would like to maintain my lifestyle. Encouraged to continue to fish and remain active      Patient Stated       11/11/19 To stay out of the hospital         This is a list of the screening recommended for you and due dates:  Health Maintenance  Topic Date Due   Mammogram  09/01/2021   COVID-19 Vaccine (6 - 2023-24 season) 04/04/2022   Cologuard (Stool DNA test)  09/14/2022   Flu Shot  03/05/2023   Medicare Annual Wellness Visit  11/28/2023   Pap Smear  10/16/2025   DTaP/Tdap/Td vaccine (2 - Td or Tdap) 01/24/2026   Hepatitis C Screening: USPSTF Recommendation to screen - Ages 41-79 yo.  Completed   HIV Screening  Completed   Zoster (Shingles) Vaccine  Completed   HPV Vaccine  Aged Out

## 2022-12-08 NOTE — Progress Notes (Unsigned)
Office Visit    Patient Name: Miranda [name] Date of Encounter: 12/09/2022  Primary Care Provider:  Sharon Seller, NP Primary Cardiologist:  Donato Schultz, MD Primary Electrophysiologist: None   Past Medical History    Past Medical History:  Diagnosis Date   Anemia    Anxiety    At risk for sudden cardiac death Nov 04, 2012   CHF - Combined Systolic + Diastolic Dysfunction. EF of 15-20% with Grade II diastolic dysfunction on echo 10/27/12 10/28/2012   CKD (chronic kidney disease)    Coronary artery disease    Depression    .   Dyspnea    none per pt on 04/23/22   Fibroids    Heart murmur    History of blood transfusion 2014   Hypertension    Hypothyroidism    S/P cardiac catheterization, 11/04/12, normal coronaries with minimal luminal irregularities in RCA system 10/30/2012   Thyroid disease    hypothyroidism   Past Surgical History:  Procedure Laterality Date   AORTIC VALVE REPLACEMENT N/A 03/12/2017   Procedure: AORTIC VALVE REPLACEMENT (AVR) with Mitral Annuloplasty;  Surgeon: Alleen Borne, MD;  Location: [facility] OR;  Service: Open Heart Surgery;  Laterality: N/A;   EXTERNAL FIXATION LEG Right 04/24/2022   Procedure: EXTERNAL FIXATION RIGHT LEG;  Surgeon: Netta Cedars, MD;  Location: [facility] OR;  Service: Orthopedics;  Laterality: Right;   EXTERNAL FIXATION REMOVAL Right 05/07/2022   Procedure: REMOVAL EXTERNAL FIXATION LEG right;  Surgeon: Netta Cedars, MD;  Location: [facility] OR;  Service: Orthopedics;  Laterality: Right;  120 min   HARDWARE REMOVAL Right 11/26/2022   Procedure: Right ankle removal of hardware (syndesmotic screws x2);  Surgeon: Netta Cedars, MD;  Location: [facility];  Service: Orthopedics;  Laterality: Right;  60   LEFT AND RIGHT HEART CATHETERIZATION WITH CORONARY ANGIOGRAM N/A 2012-11-04   Procedure: LEFT AND RIGHT HEART CATHETERIZATION WITH CORONARY ANGIOGRAM;  Surgeon: Marykay Lex, MD;  Location: [facility] CATH LAB;  Service:  Cardiovascular;  Laterality: N/A;   MITRAL VALVE REPAIR N/A 03/12/2017   Procedure: MITRAL VALVE REPAIR (MVR);  Surgeon: Alleen Borne, MD;  Location: [facility] OR;  Service: Open Heart Surgery;  Laterality: N/A;  mitral annuloplasty   MULTIPLE EXTRACTIONS WITH ALVEOLOPLASTY N/A 01/21/2017   Procedure: Extraction of tooth #'s 4-13 with alveoloplasty;  Surgeon: Charlynne Pander, DDS;  Location: [facility] OR;  Service: Oral Surgery;  Laterality: N/A;   ORIF ANKLE FRACTURE Right 05/07/2022   Procedure: OPEN REDUCTION INTERNAL FIXATION (ORIF)right  ANKLE FRACTURE possible syndesmosis and/or deltoid fixation, possible allograft;  Surgeon: Netta Cedars, MD;  Location: [facility] OR;  Service: Orthopedics;  Laterality: Right;  120 min   RIGHT/LEFT HEART CATH AND CORONARY ANGIOGRAPHY N/A 01/09/2017   Procedure: Right/Left Heart Cath and Coronary Angiography;  Surgeon: Lennette Bihari, MD;  Location: [facility] INVASIVE CV LAB;  Service: Cardiovascular;  Laterality: N/A;   TEE WITHOUT CARDIOVERSION N/A 03/12/2017   Procedure: TRANSESOPHAGEAL ECHOCARDIOGRAM (TEE);  Surgeon: Alleen Borne, MD;  Location: [facility] OR;  Service: Open Heart Surgery;  Laterality: N/A;    Allergies  Allergies  Allergen Reactions   Codeine Hives   Wellbutrin [Bupropion]     Excessive sweating      History of Present Illness    Miranda [name]  is a 57 year old female with a PMH of AVR repair and MVR repair s/p 03/2017 with 23 mm Edwards Magna-Ease pericardial valve and MV annuloplasty with 28 mm Sorin 3D MEMO, NICM  with prior EF of 50% now EF of 65%, HTN, hypothyroidism, dilated aortic root (40 mm), anxiety who presents today for follow-up of hypertension.  [name] was initially seen by Dr. Anne [name] in 2016 for chronic systolic CHF with ICM.  EF improved to 50% in March 2015. She returned in 2018 with worsening shortness of breath, going up stairs for instance. An echocardiogram was then performed which showed severe aortic regurgitation and EF of 35%.   She underwent AVR/MV repair 8/18 by Dr. Laneta [name].  She was seen on 11/24/2022 for surgical clearance.  During her visit her blood pressure was significantly elevated and patient required clonidine in office for reduction.  She had clonidine 0.1 mg added to her current regimen along with carvedilol 12.5 mg increased.  She documented her blood pressures over 1 week with some improvement and was cleared for her procedure which was successful.   Miranda [name] presents today for 1 week follow-up of hypertension.  Since last being seen in the office patient reports that she has been doing well and had a successful foot surgery.  Her blood pressure today however is still elevated and was 95 initially and was 138/92 on recheck.  She reports compliance with her current medications and denies any adverse reactions.  She has been in a walking boot since her surgical procedure and is planning to see her foot doctor next week.  She is unable to drive herself due to the walking boot and will have to reschedule some of her appointments next week.  She is euvolemic on examination and abstains from excess salt in her diet.  Patient denies chest pain, palpitations, dyspnea, PND, orthopnea, nausea, vomiting, dizziness, syncope, edema, weight gain, or early satiety.   Home Medications    Current Outpatient Medications  Medication Sig Dispense Refill   acetaminophen (TYLENOL) 500 MG tablet Take 1,000 mg by mouth every 6 (six) hours as needed for mild pain.     aspirin EC 81 MG EC tablet Take 1 tablet (81 mg total) by mouth daily.     brexpiprazole (REXULTI) 1 MG TABS tablet Take 1 tablet (1 mg total) by mouth daily. 30 tablet 1   carvedilol (COREG) 12.5 MG tablet Take 1 tablet (12.5 mg total) by mouth 2 (two) times daily. 60 tablet 0   Cholecalciferol (D3) 50 MCG (2000 UT) TABS Take 2,000 Units by mouth daily.     ferrous sulfate 325 (65 FE) MG EC tablet Take 1 tablet (325 mg total) by mouth 2 (two) times daily. 180 tablet 3    fluticasone (FLONASE) 50 MCG/ACT nasal spray Place 1 spray into both nostrils 2 (two) times daily as needed for allergies or rhinitis. 16 g 5   furosemide (LASIX) 40 MG tablet Take 1 tablet by mouth once daily 90 tablet 0   levothyroxine (SYNTHROID) 100 MCG tablet TAKE 1 TABLET BY MOUTH ONCE DAILY BEFORE BREAKFAST 90 tablet 1   lisinopril (ZESTRIL) 5 MG tablet TAKE 1 TABLET BY MOUTH AT BEDTIME . APPOINTMENT REQUIRED FOR FUTURE REFILLS 90 tablet 0   melatonin 1 MG TABS tablet Take 1 mg by mouth at bedtime.     oxyCODONE (OXY IR/ROXICODONE) 5 MG immediate release tablet Take 5 mg by mouth every 6 (six) hours as needed.     rosuvastatin (CRESTOR) 40 MG tablet Take 1 tablet by mouth once daily 90 tablet 3   traZODone (DESYREL) 100 MG tablet Take 2 tablets (200 mg total) by mouth at bedtime. 60 tablet 1  cloNIDine (CATAPRES) 0.1 MG tablet Take 1 tablet (0.1 mg total) by mouth daily. 30 tablet 3   No current facility-administered medications for this visit.     Review of Systems  Please see the history of present illness.    (+) Right foot pain   All other systems reviewed and are otherwise negative except as noted above.  Physical Exam    Wt Readings from Last 3 Encounters:  12/09/22 140 lb (63.5 kg)  11/28/22 140 lb (63.5 kg)  11/26/22 140 lb 3.4 oz (63.6 kg)   VS: Vitals:   12/09/22 1032 12/09/22 1052  BP: (!) 144/95 (!) 138/92  Pulse: 62   SpO2: 97%   ,Body mass index is 23.3 kg/m.  Constitutional:      Appearance: Healthy appearance. Not in distress.  Neck:     Vascular: JVD normal.  Pulmonary:     Effort: Pulmonary effort is normal.     Breath sounds: No wheezing. No rales. Diminished in the bases Cardiovascular:     Normal rate. Regular rhythm. Normal S1. Normal S2.      Murmurs: There is no murmur.  Edema:    Peripheral edema absent.  Abdominal:     Palpations: Abdomen is soft non tender. There is no hepatomegaly.  Skin:    General: Skin is warm and dry.   Neurological:     General: No focal deficit present.     Mental Status: Alert and oriented to person, place and time.     Cranial Nerves: Cranial nerves are intact.  EKG/LABS/ Recent Cardiac Studies    ECG personally reviewed by me today -none completed today  Cardiac Studies & Procedures   CARDIAC CATHETERIZATION  CARDIAC CATHETERIZATION 01/09/2017  Narrative  Ost RPDA to RPDA lesion, 50 %stenosed.  There is severe left ventricular systolic dysfunction.  Hemodynamic findings consistent with moderate pulmonary hypertension.  There is mild (2+) mitral regurgitation.  Dilated cardiomyopathy with severe global LV dysfunction and an ejection fraction of 15%.  There appears to be at least mild to moderate mitral regurgitation.  Moderate elevation of right heart pressures with moderate pulmonary hypertension with a PA systolic pressure of 60 mmHg.  Mildly dilated aortic root with  moderately severe aortic insufficiency.  Mild coronary obstructive disease with a normal LAD, normal small intermediate vessel, normal left circumflex, and a large dominant RCA with smooth 50% ostial PDA narrowing.  RECOMMENDATION: The patient will undergo follow-up outpatient evaluation with Dr. Laneta [name].  Findings Coronary Findings Diagnostic  Dominance: Right  Left Main Vessel was injected. Vessel is normal in caliber. Vessel is angiographically normal.  Left Anterior Descending Vessel was injected. Vessel is normal in caliber. Vessel is angiographically normal.  Ramus Intermedius Vessel was injected. Vessel is small. Vessel is angiographically normal.  Left Circumflex Vessel was injected. Vessel is normal in caliber. Vessel is angiographically normal.  Right Coronary Artery The RCA was a large caliber dominant vessel.  There was smooth 50% ostial narrowing of the PDA branch.  Right Posterior Descending Artery The lesion is smooth.  Intervention  No interventions have been documented.      ECHOCARDIOGRAM  ECHOCARDIOGRAM COMPLETE 05/14/2020  Narrative ECHOCARDIOGRAM REPORT    Patient Name:   JESSABELLE QUILTY Date of Exam: 05/14/2020 Medical Rec #:  [id number]       Height:       65.0 in Accession #:    [id number]      Weight:       140.6 lb Date of  Birth:  1966/05/15        BSA:          1.703 m Patient Age:    54 years        BP:           138/82 mmHg Patient Gender: F               HR:           69 bpm. Exam Location:  [address]  Procedure: 2D Echo, 3D Echo, Cardiac Doppler and Color Doppler  Indications:    I42.8 Non-Ischemic Cardiomyopathy I35.9 Aortic Valve Disorder  History:        Patient has prior history of Echocardiogram examinations, most recent 10/19/2017. CHF, CAD, Aortic Valve Disease, Signs/Symptoms:Dyspnea and Murmur; Risk Factors:Family History of Coronary Artery Disease, Hypertension, Dyslipidemia and Former Smoker. 03-12-17 Aortic Valve Replacement (23mm Edwards Magna Ease pericardial tissue Valve), Mitral Valve Repair (28mm Sorin 3D Memo Ring).  Sonographer:    Farrel Conners RDCS Referring Phys: [id number] LORI C GERHARDT  IMPRESSIONS   1. Left ventricular ejection fraction, by estimation, is 60 to 65%. The left ventricle has normal function. The left ventricle has no regional wall motion abnormalities. There is moderate left ventricular hypertrophy. Left ventricular diastolic parameters are consistent with Grade II diastolic dysfunction (pseudonormalization). Elevated left atrial pressure. 2. Right ventricular systolic function is mildly reduced. The right ventricular size is normal. There is normal pulmonary artery systolic pressure. The estimated right ventricular systolic pressure is 29.8 mmHg. 3. Left atrial size was severely dilated. 4. The mitral valve has been repaired/replaced. There is a 28 mm prosthetic annuloplasty ring present in the mitral position. Mild to moderate mitral valve regurgitation. Mild mitral stenosis. MG 4 mmHg at HR  62 bpm, MVA 1.9 cm^2 by continuity equation. 5. The aortic valve has been repaired/replaced. There is a 23 mm bioprosthetic valve present in the aortic position. Aortic valve regurgitation is not visualized. Echo findings are consistent with normal structure and function of the aortic valve prosthesis. MG 9 mmHg, stable from prior echo on 10/19/17. 6. Aortic dilatation noted. There is mild dilatation of the aortic root, measuring 42 mm. There is mild dilatation of the ascending aorta, measuring 40 mm. 7. The inferior vena cava is normal in size with greater than 50% respiratory variability, suggesting right atrial pressure of 3 mmHg.  FINDINGS Left Ventricle: Left ventricular ejection fraction, by estimation, is 60 to 65%. The left ventricle has normal function. The left ventricle has no regional wall motion abnormalities. The left ventricular internal cavity size was normal in size. There is moderate left ventricular hypertrophy. Left ventricular diastolic parameters are consistent with Grade II diastolic dysfunction (pseudonormalization). Elevated left atrial pressure.  Right Ventricle: The right ventricular size is normal. Right vetricular wall thickness was not assessed. Right ventricular systolic function is mildly reduced. There is normal pulmonary artery systolic pressure. The tricuspid regurgitant velocity is 2.59 m/s, and with an assumed right atrial pressure of 3 mmHg, the estimated right ventricular systolic pressure is 29.8 mmHg.  Left Atrium: Left atrial size was severely dilated.  Right Atrium: Right atrial size was normal in size.  Pericardium: There is no evidence of pericardial effusion.  Mitral Valve: The mitral valve has been repaired/replaced. Mild to moderate mitral valve regurgitation. There is a 28 mm prosthetic annuloplasty ring present in the mitral position. Mild mitral valve stenosis. MV peak gradient, 11.4 mmHg. The mean mitral valve gradient is 4.0 mmHg.  Tricuspid  Valve: The tricuspid valve is normal in structure. Tricuspid valve regurgitation is trivial.  Aortic Valve: The aortic valve has been repaired/replaced. Aortic valve regurgitation is not visualized. Aortic valve mean gradient measures 9.0 mmHg. Aortic valve peak gradient measures 16.5 mmHg. Aortic valve area, by VTI measures 2.07 cm. There is a 23 mm bioprosthetic valve present in the aortic position. Echo findings are consistent with normal structure and function of the aortic valve prosthesis.  Pulmonic Valve: The pulmonic valve was normal in structure. Pulmonic valve regurgitation is trivial.  Aorta: Aortic dilatation noted. There is mild dilatation of the aortic root, measuring 42 mm. There is mild dilatation of the ascending aorta, measuring 40 mm.  Venous: The inferior vena cava is normal in size with greater than 50% respiratory variability, suggesting right atrial pressure of 3 mmHg.  IAS/Shunts: No atrial level shunt detected by color flow Doppler.   LEFT VENTRICLE PLAX 2D LVIDd:         4.30 cm  Diastology LVIDs:         2.80 cm  LV e' medial:    4.35 cm/s LV PW:         1.10 cm  LV E/e' medial:  38.0 LV IVS:        1.10 cm  LV e' lateral:   8.70 cm/s LVOT diam:     2.40 cm  LV E/e' lateral: 19.0 LV SV:         99 LV SV Index:   58 LVOT Area:     4.52 cm  3D Volume EF: 3D EF:        64 % LV EDV:       139 ml LV ESV:       49 ml LV SV:        90 ml  RIGHT VENTRICLE RV S prime:     8.92 cm/s TAPSE (M-mode): 1.4 cm  LEFT ATRIUM              Index       RIGHT ATRIUM           Index LA diam:        5.20 cm  3.05 cm/m  RA Area:     17.10 cm LA Vol (A2C):   109.0 ml 64.01 ml/m RA Volume:   43.50 ml  25.54 ml/m LA Vol (A4C):   85.9 ml  50.44 ml/m LA Biplane Vol: 98.0 ml  57.55 ml/m AORTIC VALVE AV Area (Vmax):    1.76 cm AV Area (Vmean):   1.71 cm AV Area (VTI):     2.07 cm AV Vmax:           203.00 cm/s AV Vmean:          138.500 cm/s AV VTI:             0.478 m AV Peak Grad:      16.5 mmHg AV Mean Grad:      9.0 mmHg LVOT Vmax:         79.15 cm/s LVOT Vmean:        52.400 cm/s LVOT VTI:          0.218 m LVOT/AV VTI ratio: 0.46  AORTA Ao Root diam: 4.20 cm Ao Asc diam:  4.00 cm  MITRAL VALVE                TRICUSPID VALVE MV Area (PHT): 1.83 cm     TR Peak grad:   26.8  mmHg MV Peak grad:  11.4 mmHg    TR Vmax:        259.00 cm/s MV Mean grad:  4.0 mmHg MV Vmax:       1.69 m/s     SHUNTS MV Vmean:      94.6 cm/s    Systemic VTI:  0.22 m MV Decel Time: 386 msec     Systemic Diam: 2.40 cm MR Peak grad:   162.1 mmHg MR Mean grad:   99.0 mmHg MR Vmax:        636.50 cm/s MR Vmean:       466.0 cm/s MR PISA:        1.01 cm MR PISA Radius: 0.40 cm MV E velocity: 165.50 cm/s MV A velocity: 92.90 cm/s MV E/A ratio:  1.78  Epifanio Lesches MD Electronically signed by Epifanio Lesches MD Signature Date/Time: 05/14/2020/11:39:30 AM    Final   TEE  ECHO TEE 03/12/2017  Interpretation Summary  Left ventricle: Cavity is moderately dilated. LV systolic function is severely reduced with an EF of 25-30%. No thrombus present. No mass present.  Septum: No Patent Foramen Ovale present.  Left atrium: Patent foramen ovale not present.  Left atrium: Cavity is dilated and dilated.  Aortic valve: The valve is trileaflet. Mild valve thickening present. No stenosis. Moderate to severe regurgitation. There is flow reversal in the descending aorta.  Mitral valve: Non-specific thickening. Moderate regurgitation.  Right ventricle: Cavity is moderately dilated. Moderately reduced systolic function.  Right atrium: Cavity is dilated.  Tricuspid valve: Mild regurgitation. The tricuspid valve regurgitation jet is directed toward the right atrial free wall.  Pulmonic valve: Trace regurgitation.  Right ventricle: Normal cavity size. Mildly reduced systolic function.           Lab Results  Component Value Date   WBC 4.4 09/19/2022    HGB 12.4 09/19/2022   HCT 37.9 09/19/2022   MCV 82.8 09/19/2022   PLT 139 (L) 09/19/2022   Lab Results  Component Value Date   CREATININE 0.94 11/24/2022   BUN 9 11/24/2022   NA 136 11/24/2022   K 3.8 11/24/2022   CL 101 11/24/2022   CO2 26 11/24/2022   Lab Results  Component Value Date   ALT 9 09/19/2022   AST 19 09/19/2022   ALKPHOS 54 11/15/2021   BILITOT 0.3 09/19/2022   Lab Results  Component Value Date   CHOL 151 05/19/2022   HDL 56 05/19/2022   LDLCALC 78 05/19/2022   TRIG 91 05/19/2022   CHOLHDL 2.7 05/19/2022    Lab Results  Component Value Date   HGBA1C 5.4 06/08/2019     Assessment & Plan    1.  Essential hypertension: -HYPERTENSION CONTROL Vitals:   12/09/22 1032 12/09/22 1052  BP: (!) 144/95 (!) 138/92    The patient's blood pressure is elevated above target today.  In order to address the patient's elevated BP: Blood pressure will be monitored at home to determine if medication changes need to be made.; A current anti-hypertensive medication was adjusted today.     -Patient will continue to monitor blood pressures at home and clonidine we will change from 0.1 mg daily -We will plan to increase lisinopril if blood pressures remain elevated -She is scheduled to follow-up in the hypertension clinic  2.HFimEF: -2D echo completed 05/2020 with EF of 60-65% -Today patient is euvolemic on examination today.  She denies any sodium indiscretions and has some chronic shortness of breath but no new symptoms. -  Continue carvedilol 12.5 mg twice daily, Lasix 40 mg daily, lisinopril 5 mg daily -Low sodium diet, fluid restriction <2L, and daily weights encouraged. Educated to contact our office for weight gain of 2 lbs overnight or 5 lbs in one week.   3.History of AVR/MVR repair: -s/p 2018 with most recent TTE completed 05/2020 with stable repairs and EF of 60 to 65% -Patient has chronic shortness of breath with exertion but today reports no complaints of  shortness of breath with activity. -Patient aware of SBE prophylaxis for dental procedures  4.  Hyperlipidemia: -Patient's last LDL cholesterol was 78 well-controlled -Continue Crestor 40 mg daily  Disposition: Follow-up with Donato Schultz, MD as scheduled     Medication Adjustments/Labs and Tests Ordered: Current medicines are reviewed at length with the patient today.  Concerns regarding medicines are outlined above.   Signed, Napoleon Form, Leodis Rains, NP 12/09/2022, 12:44 PM [facility]

## 2022-12-09 ENCOUNTER — Encounter: Payer: Self-pay | Admitting: Nurse Practitioner

## 2022-12-09 ENCOUNTER — Ambulatory Visit: Payer: HMO | Attending: Nurse Practitioner | Admitting: Nurse Practitioner

## 2022-12-09 VITALS — BP 138/92 | HR 62 | Ht 65.0 in | Wt 140.0 lb

## 2022-12-09 DIAGNOSIS — Z9889 Other specified postprocedural states: Secondary | ICD-10-CM

## 2022-12-09 DIAGNOSIS — I10 Essential (primary) hypertension: Secondary | ICD-10-CM

## 2022-12-09 DIAGNOSIS — Z952 Presence of prosthetic heart valve: Secondary | ICD-10-CM | POA: Diagnosis not present

## 2022-12-09 DIAGNOSIS — F3341 Major depressive disorder, recurrent, in partial remission: Secondary | ICD-10-CM

## 2022-12-09 DIAGNOSIS — E782 Mixed hyperlipidemia: Secondary | ICD-10-CM | POA: Diagnosis not present

## 2022-12-09 DIAGNOSIS — I5032 Chronic diastolic (congestive) heart failure: Secondary | ICD-10-CM | POA: Diagnosis not present

## 2022-12-09 MED ORDER — CLONIDINE HCL 0.1 MG PO TABS: 0.1000 mg | ORAL_TABLET | Freq: Every day | ORAL | 3 refills | Status: DC

## 2022-12-09 NOTE — Patient Instructions (Addendum)
Medication Instructions:  START Clonidine 0.1mg  Take 1 tablet once a day *If you need a refill on your cardiac medications before your next appointment, please call your pharmacy*   Lab Work: None ordered If you have labs (blood work) drawn today and your tests are completely normal, you will receive your results only by: MyChart Message (if you have MyChart) OR A paper copy in the mail If you have any lab test that is abnormal or we need to change your treatment, we will call you to review the results.   Testing/Procedures: None ordered   Follow-Up: At [facility], you and your health needs are our priority.  As part of our continuing mission to provide you with exceptional heart care, we have created designated Provider Care Teams.  These Care Teams include your primary Cardiologist (physician) and Advanced Practice Providers (APPs -  Physician Assistants and Nurse Practitioners) who all work together to provide you with the care you need, when you need it.  We recommend signing up for the patient portal called "MyChart".  Sign up information is provided on this After Visit Summary.  MyChart is used to connect with patients for Virtual Visits (Telemedicine).  Patients are able to view lab/test results, encounter notes, upcoming appointments, etc.  Non-urgent messages can be sent to your provider as well.   To learn more about what you can do with MyChart, go to [email or web address].    Your next appointment:    FOLLOW UP AS SCHEDULED  Provider:   Donato Schultz, MD     RESCHEDULE [facility] APPT  Other Instructions Check your blood pressure daily for 2 weeks, then contact the office with your readings.

## 2022-12-17 ENCOUNTER — Ambulatory Visit: Payer: HMO

## 2022-12-18 ENCOUNTER — Other Ambulatory Visit: Payer: Self-pay | Admitting: Nurse Practitioner

## 2022-12-18 ENCOUNTER — Other Ambulatory Visit: Payer: Self-pay | Admitting: Cardiology

## 2022-12-18 DIAGNOSIS — I952 Hypotension due to drugs: Secondary | ICD-10-CM

## 2022-12-18 DIAGNOSIS — F331 Major depressive disorder, recurrent, moderate: Secondary | ICD-10-CM

## 2022-12-19 DIAGNOSIS — S82851D Displaced trimalleolar fracture of right lower leg, subsequent encounter for closed fracture with routine healing: Secondary | ICD-10-CM | POA: Diagnosis not present

## 2023-01-08 ENCOUNTER — Ambulatory Visit
Admission: RE | Admit: 2023-01-08 | Discharge: 2023-01-08 | Disposition: A | Discharge status: Home / Self Care | Payer: PPO | Source: Ambulatory Visit | Attending: Nurse Practitioner | Admitting: Nurse Practitioner

## 2023-01-08 DIAGNOSIS — Z1231 Encounter for screening mammogram for malignant neoplasm of breast: Secondary | ICD-10-CM | POA: Diagnosis not present

## 2023-01-16 ENCOUNTER — Encounter: Payer: Self-pay | Admitting: Nurse Practitioner

## 2023-01-16 ENCOUNTER — Ambulatory Visit (INDEPENDENT_AMBULATORY_CARE_PROVIDER_SITE_OTHER): Payer: PPO | Admitting: Nurse Practitioner

## 2023-01-16 VITALS — BP 160/110 | HR 90 | Temp 97.2°F | Resp 16 | Ht 65.0 in | Wt 139.6 lb

## 2023-01-16 DIAGNOSIS — F331 Major depressive disorder, recurrent, moderate: Secondary | ICD-10-CM | POA: Diagnosis not present

## 2023-01-16 DIAGNOSIS — R61 Generalized hyperhidrosis: Secondary | ICD-10-CM

## 2023-01-16 DIAGNOSIS — E039 Hypothyroidism, unspecified: Secondary | ICD-10-CM | POA: Diagnosis not present

## 2023-01-16 DIAGNOSIS — I428 Other cardiomyopathies: Secondary | ICD-10-CM | POA: Diagnosis not present

## 2023-01-16 DIAGNOSIS — I10 Essential (primary) hypertension: Secondary | ICD-10-CM

## 2023-01-16 MED ORDER — MIRTAZAPINE 15 MG PO TABS
ORAL_TABLET | ORAL | 1 refills | Status: DC
Start: 2023-01-16 — End: 2023-02-17

## 2023-01-16 MED ORDER — LISINOPRIL 10 MG PO TABS: 10.0000 mg | ORAL_TABLET | Freq: Every day | ORAL | 1 refills | Status: DC

## 2023-01-16 MED ORDER — TRAZODONE HCL 100 MG PO TABS
ORAL_TABLET | ORAL | 0 refills | Status: DC
Start: 2023-01-16 — End: 2023-03-03

## 2023-01-16 NOTE — Patient Instructions (Addendum)
Decrease trazodone to 100 mg daily for 1 week, then 50 mg daily for 1 week then stop.   Start mirtazapine 7.5 mg daily at bedtime for 1 week then increase to 15 mg at bedtime for 1 week then increase to 30 mg at bedtime.   Increase lisinopril 10 mg daily  Go home 10 mg lisinopril.   Take blood pressure ~1 hour after you have taken medication Make sure you have been sitting at least 5 mins Keep follow up with cardiology (write down bp and bring to visit with them)

## 2023-01-16 NOTE — Progress Notes (Signed)
Careteam: Patient Care Team: Sharon Seller, NP as PCP - General (Geriatric Medicine) Jake Bathe, MD as PCP - Cardiology (Cardiology) Allie Bossier, MD as Consulting Physician (Obstetrics and Gynecology) Gala Romney, Bevelyn Buckles, MD as Consulting Physician (Cardiology) Marjory Lies, Glenford Bayley, MD as Consulting Physician (Neurology)  PLACE OF SERVICE:  [facility]  Advanced Directive information Does Patient Have a Medical Advance Directive?: No, Would patient like information on creating a medical advance directive?: No - Patient declined  Allergies  Allergen Reactions   Codeine Hives   Wellbutrin [Bupropion]     Excessive sweating     Chief Complaint  Patient presents with   Medical Management of Chronic Issues    3 month follow up   Health Maintenance    Discuss the need for Fecal DNA   Immunizations    Discuss the need for Covid Booster. NCIR Verified.      HPI: Patient is a 57 y.o. female for routine follow up.   Blood pressure high this morning. Took her medication prior to coming to appt.  Reports she ate jambalaya last night for supper out of a box. Followed by cardiology for her blood pressure as well. Started clonidine 1 mg daily She is taking lisinopril 5 mg at nighttime.  No headaches, shortness of breath or chest pain.   No pain at this time. Some discomfort to left ankle. She is no longer wearing a boot.  Doing well at this time.   She reports she continues to hear movement and ringing in her ears.  She did not have this with the Wellbutrin.  She is taking trazodone 2 tablet at bedtime.  Also taking rexulti 1 mg daily but does not feel like this is helping mood.  She would like to get back on remeron.  She reports sleep is so-so She is also taking melatonin  Review of Systems:  Review of Systems  Constitutional:  Negative for chills, fever and weight loss.  HENT:  Negative for tinnitus.   Respiratory:  Negative for cough, sputum production and  shortness of breath.   Cardiovascular:  Negative for chest pain, palpitations and leg swelling.  Gastrointestinal:  Negative for abdominal pain, constipation, diarrhea and heartburn.  Genitourinary:  Negative for dysuria, frequency and urgency.  Musculoskeletal:  Negative for back pain, falls, joint pain and myalgias.  Skin: Negative.   Neurological:  Negative for dizziness and headaches.  Endo/Heme/Allergies:        Excessive sweating  Psychiatric/Behavioral:  Positive for depression. Negative for memory loss. The patient is nervous/anxious. The patient does not have insomnia.     Past Medical History:  Diagnosis Date   Anemia    Anxiety    At risk for sudden cardiac death 18-Nov-2012   CHF - Combined Systolic + Diastolic Dysfunction. EF of 15-20% with Grade II diastolic dysfunction on echo 10/27/12 10/28/2012   CKD (chronic kidney disease)    Coronary artery disease    Depression    .   Dyspnea    none per pt on 04/23/22   Fibroids    Heart murmur    History of blood transfusion 2014   Hypertension    Hypothyroidism    S/P cardiac catheterization, 18-Nov-2012, normal coronaries with minimal luminal irregularities in RCA system 10/30/2012   Thyroid disease    hypothyroidism   Past Surgical History:  Procedure Laterality Date   AORTIC VALVE REPLACEMENT N/A 03/12/2017   Procedure: AORTIC VALVE REPLACEMENT (AVR) with Mitral Annuloplasty;  Surgeon: Alleen Borne, MD;  Location: [facility] OR;  Service: Open Heart Surgery;  Laterality: N/A;   EXTERNAL FIXATION LEG Right 04/24/2022   Procedure: EXTERNAL FIXATION RIGHT LEG;  Surgeon: Netta Cedars, MD;  Location: [facility] OR;  Service: Orthopedics;  Laterality: Right;   EXTERNAL FIXATION REMOVAL Right 05/07/2022   Procedure: REMOVAL EXTERNAL FIXATION LEG right;  Surgeon: Netta Cedars, MD;  Location: [facility] OR;  Service: Orthopedics;  Laterality: Right;  120 min   HARDWARE REMOVAL Right 11/26/2022   Procedure: Right ankle removal of hardware  (syndesmotic screws x2);  Surgeon: Netta Cedars, MD;  Location: [facility];  Service: Orthopedics;  Laterality: Right;  60   LEFT AND RIGHT HEART CATHETERIZATION WITH CORONARY ANGIOGRAM N/A 10/29/2012   Procedure: LEFT AND RIGHT HEART CATHETERIZATION WITH CORONARY ANGIOGRAM;  Surgeon: Marykay Lex, MD;  Location: [facility] CATH LAB;  Service: Cardiovascular;  Laterality: N/A;   MITRAL VALVE REPAIR N/A 03/12/2017   Procedure: MITRAL VALVE REPAIR (MVR);  Surgeon: Alleen Borne, MD;  Location: [facility] OR;  Service: Open Heart Surgery;  Laterality: N/A;  mitral annuloplasty   MULTIPLE EXTRACTIONS WITH ALVEOLOPLASTY N/A 01/21/2017   Procedure: Extraction of tooth #'s 4-13 with alveoloplasty;  Surgeon: Charlynne Pander, DDS;  Location: [facility] OR;  Service: Oral Surgery;  Laterality: N/A;   ORIF ANKLE FRACTURE Right 05/07/2022   Procedure: OPEN REDUCTION INTERNAL FIXATION (ORIF)right  ANKLE FRACTURE possible syndesmosis and/or deltoid fixation, possible allograft;  Surgeon: Netta Cedars, MD;  Location: [facility] OR;  Service: Orthopedics;  Laterality: Right;  120 min   RIGHT/LEFT HEART CATH AND CORONARY ANGIOGRAPHY N/A 01/09/2017   Procedure: Right/Left Heart Cath and Coronary Angiography;  Surgeon: Lennette Bihari, MD;  Location: [facility] INVASIVE CV LAB;  Service: Cardiovascular;  Laterality: N/A;   TEE WITHOUT CARDIOVERSION N/A 03/12/2017   Procedure: TRANSESOPHAGEAL ECHOCARDIOGRAM (TEE);  Surgeon: Alleen Borne, MD;  Location: [facility] OR;  Service: Open Heart Surgery;  Laterality: N/A;   Social History:   reports that she quit smoking about 44 years ago. Her smoking use included cigars and cigarettes. She has a 2.50 pack-year smoking history. She has never used smokeless tobacco. She reports current alcohol use of about 5.0 standard drinks of alcohol per week. She reports that she does not currently use drugs after having used the following drugs: Marijuana. Frequency: 5.00 times per week.  Family History   Problem Relation Age of Onset   Stroke Mother    Hypertension Mother    Liver disease Mother        autoimmune   Heart disease Mother    Colon polyps Mother    Stroke Father    Hypertension Father    Hypertension Maternal Grandmother    Heart disease Maternal Grandmother    Bone cancer Paternal Grandfather    Bone cancer Maternal Uncle    Arthritis Paternal Uncle    Breast cancer Cousin     Medications: Patient's Medications  New Prescriptions   No medications on file  Previous Medications   ACETAMINOPHEN (TYLENOL) 500 MG TABLET    Take 1,000 mg by mouth every 6 (six) hours as needed for mild pain.   ASPIRIN EC 81 MG EC TABLET    Take 1 tablet (81 mg total) by mouth daily.   BREXPIPRAZOLE (REXULTI) 1 MG TABS TABLET    Take 1 tablet (1 mg total) by mouth daily.   CARVEDILOL (COREG) 12.5 MG TABLET    Take 1 tablet (12.5 mg total) by  mouth 2 (two) times daily.   CHOLECALCIFEROL (D3) 50 MCG (2000 UT) TABS    Take 2,000 Units by mouth daily.   CLONIDINE (CATAPRES) 0.1 MG TABLET    Take 1 tablet (0.1 mg total) by mouth daily.   FERROUS SULFATE 325 (65 FE) MG EC TABLET    Take 1 tablet (325 mg total) by mouth 2 (two) times daily.   FLUTICASONE (FLONASE) 50 MCG/ACT NASAL SPRAY    Place 1 spray into both nostrils 2 (two) times daily as needed for allergies or rhinitis.   FUROSEMIDE (LASIX) 40 MG TABLET    Take 1 tablet by mouth once daily   LEVOTHYROXINE (SYNTHROID) 100 MCG TABLET    TAKE 1 TABLET BY MOUTH ONCE DAILY BEFORE BREAKFAST   LISINOPRIL (ZESTRIL) 5 MG TABLET    Take 1 tablet (5 mg total) by mouth daily.   MELATONIN 1 MG TABS TABLET    Take 1 mg by mouth at bedtime.   ROSUVASTATIN (CRESTOR) 40 MG TABLET    Take 1 tablet by mouth once daily   TRAZODONE (DESYREL) 100 MG TABLET    TAKE 2 TABLETS BY MOUTH AT BEDTIME  Modified Medications   No medications on file  Discontinued Medications   OXYCODONE (OXY IR/ROXICODONE) 5 MG IMMEDIATE RELEASE TABLET    Take 5 mg by mouth every 6  (six) hours as needed.    Physical Exam:  Vitals:   01/16/23 0935 01/16/23 0947  BP: (!) 180/120 (!) 160/110  Pulse: 90   Resp: 16   Temp: (!) 97.2 F (36.2 C)   SpO2: 90%   Weight: 139 lb 9.6 oz (63.3 kg)   Height: 5\' 5"  (1.651 m)    Body mass index is 23.23 kg/m. Wt Readings from Last 3 Encounters:  01/16/23 139 lb 9.6 oz (63.3 kg)  12/09/22 140 lb (63.5 kg)  11/28/22 140 lb (63.5 kg)    Physical Exam Constitutional:      General: She is not in acute distress.    Appearance: She is well-developed. She is not diaphoretic.  HENT:     Head: Normocephalic and atraumatic.     Mouth/Throat:     Pharynx: No oropharyngeal exudate.  Eyes:     Conjunctiva/sclera: Conjunctivae normal.     Pupils: Pupils are equal, round, and reactive to light.  Cardiovascular:     Rate and Rhythm: Normal rate and regular rhythm.     Heart sounds: Normal heart sounds.  Pulmonary:     Effort: Pulmonary effort is normal.     Breath sounds: Normal breath sounds.  Abdominal:     General: Bowel sounds are normal.     Palpations: Abdomen is soft.  Musculoskeletal:     Cervical back: Normal range of motion and neck supple.     Right lower leg: No edema.     Left lower leg: No edema.  Skin:    General: Skin is warm and dry.  Neurological:     Mental Status: She is alert.  Psychiatric:        Mood and Affect: Mood normal.     Labs reviewed: Basic Metabolic Panel: Recent Labs    04/21/22 0951 09/19/22 1044 11/24/22 1400  NA 137 141 136  K 4.7 4.2 3.8  CL 102 108 101  CO2 26 22 26   GLUCOSE 84 99 90  BUN 17 14 9   CREATININE 0.98 0.92 0.94  CALCIUM 10.1 9.9 9.7  TSH  --  3.35  --  Liver Function Tests: Recent Labs    04/21/22 0951 09/19/22 1044  AST 21 19  ALT 8 9  BILITOT 0.5 0.3  PROT 9.0* 8.5*   No results for input(s): "LIPASE", "AMYLASE" in the last 8760 hours. No results for input(s): "AMMONIA" in the last 8760 hours. CBC: Recent Labs    04/21/22 0951  09/19/22 1044  WBC 6.2 4.4  NEUTROABS 3,968 2,266  HGB 12.0 12.4  HCT 37.2 37.9  MCV 86.9 82.8  PLT 186 139*   Lipid Panel: Recent Labs    05/19/22 1039  CHOL 151  HDL 56  LDLCALC 78  TRIG 91  CHOLHDL 2.7   TSH: Recent Labs    09/19/22 1044  TSH 3.35   A1C: Lab Results  Component Value Date   HGBA1C 5.4 06/08/2019     Assessment/Plan 1. Moderate episode of recurrent major depressive disorder (HCC) Ongoing, would like to get back on Remeron vs trazodone. Will titrate trazodone off at this time and titrate Remeron up to 30 mg daily at bedtime. May benefit from psych referral, she does not wish to do so at this time. - mirtazapine (REMERON) 15 MG tablet; Start with half tablet daily at bedtime for 1 week then increase to whole tablet for 1 week then increase to 2 tablet daily at bedtime  Dispense: 60 tablet; Refill: 1 - traZODone (DESYREL) 100 MG tablet; 100 mg by mouth at bedtime daily for 1 week then decrease to 50 mg daily for 1 week then stop.  Dispense: 15 tablet; Refill: 0  2. Excessive sweating Ongoing but better since off wellbutrin.   3. Hypothyroidism, unspecified type -continues on synthroid, tsh at goal   4. Cardiomyopathy, nonischemic (HCC) Without progressive symptoms at this time - lisinopril (ZESTRIL) 10 MG tablet; Take 1 tablet (10 mg total) by mouth daily.  Dispense: 30 tablet; Refill: 1  5. Primary hypertension -will have her take lisinopril 10 mg daily, she will take when she gets home and recheck blood pressure in an hour and report back.  -low sodium diet encourage -to keep appt with cardiology and bring BP readings next week.  - lisinopril (ZESTRIL) 10 MG tablet; Take 1 tablet (10 mg total) by mouth daily.  Dispense: 30 tablet; Refill: 1   Return in about 3 months (around 04/18/2023) for routine follow up .  Janene Harvey. Biagio Borg [facility] [phone]

## 2023-01-22 ENCOUNTER — Ambulatory Visit: Payer: PPO | Attending: Internal Medicine | Admitting: Pharmacist

## 2023-01-22 ENCOUNTER — Other Ambulatory Visit: Payer: Self-pay | Admitting: Nurse Practitioner

## 2023-01-22 VITALS — BP 144/90 | HR 62

## 2023-01-22 DIAGNOSIS — I10 Essential (primary) hypertension: Secondary | ICD-10-CM

## 2023-01-22 DIAGNOSIS — F331 Major depressive disorder, recurrent, moderate: Secondary | ICD-10-CM

## 2023-01-22 LAB — BASIC METABOLIC PANEL
BUN/Creatinine Ratio: 11 (ref 9–23)
BUN: 11 mg/dL (ref 6–24)
CO2: 25 mmol/L (ref 20–29)
Calcium: 9.7 mg/dL (ref 8.7–10.2)
Chloride: 104 mmol/L (ref 96–106)
Creatinine, Ser: 0.97 mg/dL (ref 0.57–1.00)
Glucose: 76 mg/dL (ref 70–99)
Potassium: 3.7 mmol/L (ref 3.5–5.2)
Sodium: 141 mmol/L (ref 134–144)
eGFR: 67 mL/min/{1.73_m2} (ref >59)

## 2023-01-22 NOTE — Patient Instructions (Signed)
Summary of today's discussion  We will call you tomorrow to confirm medication changes  2. Please increase your physical activity  3. Please check blood pressure daily and record the readings. Check a few hours after your medications  4. Please bring the list of readings and your blood pressure machine to your next visit  5. Please call me at [phone] with any issues   Your blood pressure goal is <130/80  To check your pressure at home you will need to:  1. Sit up in a chair, with feet flat on the floor and back supported. Do not cross your ankles or legs. 2. Rest your left arm so that the cuff is about heart level. If the cuff goes on your upper arm,  then just relax the arm on the table, arm of the chair or your lap. If you have a wrist cuff, we  suggest relaxing your wrist against your chest (think of it as Pledging the Flag with the  wrong arm).  3. Place the cuff snugly around your arm, about 1 inch above the crook of your elbow. The  cords should be inside the groove of your elbow.  4. Sit quietly, with the cuff in place, for about 5 minutes. After that 5 minutes press the power  button to start a reading. 5. Do not talk or move while the reading is taking place.  6. Record your readings on a sheet of paper. Although most cuffs have a memory, it is often  easier to see a pattern developing when the numbers are all in front of you.  7. You can repeat the reading after 1-3 minutes if it is recommended  Make sure your bladder is empty and you have not had caffeine or tobacco within the last 30 min  Always bring your blood pressure log with you to your appointments. If you have not brought your monitor in to be double checked for accuracy, please bring it to your next appointment.  You can find a list of validated (accurate) blood pressure cuffs at [email or web address]   Important lifestyle changes to control high blood pressure  Intervention  Effect on the BP  Lose extra pounds  and watch your waistline Weight loss is one of the most effective lifestyle changes for controlling blood pressure. If you're overweight or obese, losing even a small amount of weight can help reduce blood pressure. Blood pressure might go down by about 1 millimeter of mercury (mm Hg) with each kilogram (about 2.2 pounds) of weight lost.  Exercise regularly As a general goal, aim for at least 30 minutes of moderate physical activity every day. Regular physical activity can lower high blood pressure by about 5 to 8 mm Hg.  Eat a healthy diet Eating a diet rich in whole grains, fruits, vegetables, and low-fat dairy products and low in saturated fat and cholesterol. A healthy diet can lower high blood pressure by up to 11 mm Hg.  Reduce salt (sodium) in your diet 2000mg /day Even a small reduction of sodium in the diet can improve heart health and reduce high blood pressure by about 5 to 6 mm Hg.  Limit alcohol One drink equals 12 ounces of beer, 5 ounces of wine, or 1.5 ounces of 80-proof liquor.  Limiting alcohol to less than one drink a day for women or two drinks a day for men can help lower blood pressure by about 4 mm Hg.   Please call me at [phone] with any questions.

## 2023-01-22 NOTE — Progress Notes (Signed)
Patient ID: Miranda [name]                 DOB: Sep 21, 1965                      MRN: [id number]      HPI: Miranda [name] is a 57 y.o. female patient of Dr. Anne Fu referred by Robin Searing, NP to HTN clinic. PMH is significant for AVR repair and MVR repair s/p 03/2017 with 23 mm Edwards Magna-Ease pericardial valve and MV annuloplasty with 28 mm Sorin 3D MEMO, NICM with prior EF of 35% now EF of 65%, HTN, hypothyroidism, dilated aortic root (40 mm), anxiety.   She was seen on 11/24/2022 for surgical clearance. During her visit her blood pressure was significantly elevated and patient required clonidine in office for reduction. She had clonidine 0.1 mg added to her current regimen along with carvedilol increased to 12.5 mg. She documented her blood pressures over 1 week with some improvement and was cleared for her procedure which was successful.  At follow up, blood pressure was elevated so clonidine 0.1mg  daily was added to her regimen. On 6/14 PCP increased lisinopril to 10mg  daily.   Patient presents today to hypertension clinic.  Her mood appears low.  Patient denies any dizziness, lightheadedness, headache or blurred vision.  She did not bring in her blood pressure log or her blood pressure cuff.  Reports readings as high.  When asked specify she said 150s/ 80s-100.  Checking first thing in the morning.  Denies NSAID use with the exception of rare naproxen use. Asks about when she should be taking her medications.  Reports she used to go to the [facility] and walk the track regularly.  Broke her ankle in September and feels like it is just starting to heal good.  Has been back to the [facility] a few times but not consistently.  Admits that her diet has been stopped since her injury.  Previously on spironolactone 12.5 mg daily but it appears several years ago it was stopped due to symptomatic hypotension.  Patient with a history of heart failure with reduced ejection fraction now recovered.  Current HTN meds:  lisinopril 10mg  daily, carvedilol 12.5mg  twice a day, clonidine 0.1mg  daily Previously tried: spironolactone 12.5mg  (stopped for low BP) BP goal: <130/80  Family History:  Family History  Problem Relation Age of Onset   Stroke Mother    Hypertension Mother    Liver disease Mother        autoimmune   Heart disease Mother    Colon polyps Mother    Stroke Father    Hypertension Father    Hypertension Maternal Grandmother    Heart disease Maternal Grandmother    Bone cancer Paternal Grandfather    Bone cancer Maternal Uncle    Arthritis Paternal Uncle    Breast cancer Cousin      Social History: Denies any tobacco use, reports drinks about 4 alcoholic drinks weekly  Diet:  Drink: water, flavored water, decaf soda Breakfast: no appetite for breakfast Lunch: sometimes doesn't eat, sandwich w/ tuna, deli meat, leftovers Dinner: chicken, salads - eats both out and at home  Exercise:  Previously went to the [facility]- not since sept when she broke her ankle  Home BP readings:  150's/80-100's   Wt Readings from Last 3 Encounters:  01/16/23 139 lb 9.6 oz (63.3 kg)  12/09/22 140 lb (63.5 kg)  11/28/22 140 lb (63.5 kg)   BP Readings from Last  3 Encounters:  01/22/23 (!) 144/90  01/16/23 (!) 160/110  12/09/22 (!) 138/92   Pulse Readings from Last 3 Encounters:  01/22/23 62  01/16/23 90  12/09/22 62    Renal function: CrCl cannot be calculated (Patient's most recent lab result is older than the maximum 21 days allowed.).  Past Medical History:  Diagnosis Date   Anemia    Anxiety    At risk for sudden cardiac death 21-Nov-2012   CHF - Combined Systolic + Diastolic Dysfunction. EF of 15-20% with Grade II diastolic dysfunction on echo 10/27/12 10/28/2012   CKD (chronic kidney disease)    Coronary artery disease    Depression    .   Dyspnea    none per pt on 04/23/22   Fibroids    Heart murmur    History of blood transfusion 2014   Hypertension    Hypothyroidism    S/P  cardiac catheterization, Nov 21, 2012, normal coronaries with minimal luminal irregularities in RCA system 10/30/2012   Thyroid disease    hypothyroidism    Current Outpatient Medications on File Prior to Visit  Medication Sig Dispense Refill   carvedilol (COREG) 12.5 MG tablet Take 1 tablet (12.5 mg total) by mouth 2 (two) times daily. 60 tablet 0   cloNIDine (CATAPRES) 0.1 MG tablet Take 1 tablet (0.1 mg total) by mouth daily. 30 tablet 3   lisinopril (ZESTRIL) 10 MG tablet Take 1 tablet (10 mg total) by mouth daily. 30 tablet 1   acetaminophen (TYLENOL) 500 MG tablet Take 1,000 mg by mouth every 6 (six) hours as needed for mild pain.     aspirin EC 81 MG EC tablet Take 1 tablet (81 mg total) by mouth daily.     Cholecalciferol (D3) 50 MCG (2000 UT) TABS Take 2,000 Units by mouth daily.     ferrous sulfate 325 (65 FE) MG EC tablet Take 1 tablet (325 mg total) by mouth 2 (two) times daily. 180 tablet 3   fluticasone (FLONASE) 50 MCG/ACT nasal spray Place 1 spray into both nostrils 2 (two) times daily as needed for allergies or rhinitis. 16 g 5   furosemide (LASIX) 40 MG tablet Take 1 tablet by mouth once daily 90 tablet 3   levothyroxine (SYNTHROID) 100 MCG tablet TAKE 1 TABLET BY MOUTH ONCE DAILY BEFORE BREAKFAST 90 tablet 1   melatonin 1 MG TABS tablet Take 1 mg by mouth at bedtime.     mirtazapine (REMERON) 15 MG tablet Start with half tablet daily at bedtime for 1 week then increase to whole tablet for 1 week then increase to 2 tablet daily at bedtime 60 tablet 1   rosuvastatin (CRESTOR) 40 MG tablet Take 1 tablet by mouth once daily 90 tablet 3   traZODone (DESYREL) 100 MG tablet 100 mg by mouth at bedtime daily for 1 week then decrease to 50 mg daily for 1 week then stop. 15 tablet 0   No current facility-administered medications on file prior to visit.    Allergies  Allergen Reactions   Codeine Hives   Wellbutrin [Bupropion]     Excessive sweating     Blood pressure (!) 144/90,  pulse 62, last menstrual period 01/17/2015.   Assessment/Plan: HYPERTENSION CONTROL Vitals:   01/22/23 0943 01/22/23 0952  BP: (!) 144/90 (!) 144/90    The patient's blood pressure is elevated above target today.  In order to address the patient's elevated BP:       1. Hypertension -  HTN (hypertension) Assessment:  Blood pressure above goal of less than 130/80 in clinic.  Blood pressure in clinic 144/90 and same reading was obtained on recheck Patient's activity level has decreased since breaking her ankle in September Reports her diet to be poor.  Does not have an appetite much Mood seems low, PCP offered her referral to psychiatrist but she declined Currently on clonidine.  Did not particularly like this medication given its potential for side effects and lack of cardiovascular benefit.  Patient previously on spironolactone for her heart failure.  This was stopped due to hypotension at that time but patient had been on for several years prior to this Lisinopril increased to 10 mg just a few days ago.  Will need to recheck BMP before any other changes are made  Plan: Check BMP today If serum creatinine and potassium are stable we will stop clonidine and add spironolactone 12.5 mg daily I have asked her to check her blood pressure at least once or twice a day.  I asked her to check at least a few hours after she is taking her medications I have asked patient to record her readings, provided her with a log, and bring that log and her blood pressure cuff to her next visit She has follow-up with Dr. Anne Fu on July 2 and then will see me on July 16   Thank you  Olene Floss, Pharm.D, BCACP, BCPS, CPP [facility] of  [facility] [address] [place], [place] [address]  Phone: [phone]; Fax: [phone]

## 2023-01-22 NOTE — Assessment & Plan Note (Signed)
Assessment: Blood pressure above goal of less than 130/80 in clinic.  Blood pressure in clinic 144/90 and same reading was obtained on recheck Patient's activity level has decreased since breaking her ankle in September Reports her diet to be poor.  Does not have an appetite much Mood seems low, PCP offered her referral to psychiatrist but she declined Currently on clonidine.  Did not particularly like this medication given its potential for side effects and lack of cardiovascular benefit.  Patient previously on spironolactone for her heart failure.  This was stopped due to hypotension at that time but patient had been on for several years prior to this Lisinopril increased to 10 mg just a few days ago.  Will need to recheck BMP before any other changes are made  Plan: Check BMP today If serum creatinine and potassium are stable we will stop clonidine and add spironolactone 12.5 mg daily I have asked her to check her blood pressure at least once or twice a day.  I asked her to check at least a few hours after she is taking her medications I have asked patient to record her readings, provided her with a log, and bring that log and her blood pressure cuff to her next visit She has follow-up with Dr. Anne Fu on July 2 and then will see me on July 16

## 2023-01-23 ENCOUNTER — Telehealth: Payer: Self-pay | Admitting: Pharmacist

## 2023-01-23 ENCOUNTER — Telehealth: Payer: Self-pay | Admitting: Cardiology

## 2023-01-23 DIAGNOSIS — I10 Essential (primary) hypertension: Secondary | ICD-10-CM

## 2023-01-23 MED ORDER — SPIRONOLACTONE 25 MG PO TABS: 12.5000 mg | ORAL_TABLET | Freq: Every day | ORAL | 3 refills | Status: DC

## 2023-01-23 NOTE — Telephone Encounter (Signed)
After holding for 14 mins and 12 seconds - was able to inform pharmacist at [facility] we are aware of the potential risk of increase potassium and pt is being monitored.  Advised OK to fill both prescriptions.

## 2023-01-23 NOTE — Telephone Encounter (Signed)
BMET from yesterday is stable with SCr of 0.97 and K of 3.7. Pt aware to stop clonidine 0.1mg  daily and start spironolactone 12.5mg  daily per Melissa. BMET scheduled 7/2 same day as visit with Dr Anne Fu, advised pt to go to lab after her MD appt in case other labs need to be added on as well. She will monitor her BP at home.

## 2023-01-23 NOTE — Telephone Encounter (Signed)
Her K is being monitored, was 3.7 yesterday. Pharmacy should fill both rx.

## 2023-01-23 NOTE — Telephone Encounter (Signed)
Pt c/o medication issue:  1. Name of Medication: lisinopril (ZESTRIL) 10 MG tablet spironolactone (ALDACTONE) 25 MG tablet  2. How are you currently taking this medication (dosage and times per day)?   3. Are you having a reaction (difficulty breathing--STAT)? No  4. What is your medication issue? Pharmacy called to state that the two medications listed above have an interaction between the two that will raise the potassium levels. Please advise.

## 2023-01-27 ENCOUNTER — Other Ambulatory Visit: Payer: Self-pay | Admitting: Nurse Practitioner

## 2023-01-30 DIAGNOSIS — S82851D Displaced trimalleolar fracture of right lower leg, subsequent encounter for closed fracture with routine healing: Secondary | ICD-10-CM | POA: Diagnosis not present

## 2023-02-03 ENCOUNTER — Ambulatory Visit: Payer: PPO

## 2023-02-03 ENCOUNTER — Encounter: Payer: Self-pay | Admitting: Cardiology

## 2023-02-03 ENCOUNTER — Ambulatory Visit: Payer: PPO | Attending: Cardiology | Admitting: Cardiology

## 2023-02-03 VITALS — BP 118/70 | HR 66 | Ht 65.0 in | Wt 140.4 lb

## 2023-02-03 DIAGNOSIS — I5022 Chronic systolic (congestive) heart failure: Secondary | ICD-10-CM | POA: Diagnosis not present

## 2023-02-03 DIAGNOSIS — Z952 Presence of prosthetic heart valve: Secondary | ICD-10-CM

## 2023-02-03 DIAGNOSIS — R232 Flushing: Secondary | ICD-10-CM | POA: Diagnosis not present

## 2023-02-03 DIAGNOSIS — I77810 Thoracic aortic ectasia: Secondary | ICD-10-CM | POA: Diagnosis not present

## 2023-02-03 DIAGNOSIS — Z9889 Other specified postprocedural states: Secondary | ICD-10-CM | POA: Diagnosis not present

## 2023-02-03 DIAGNOSIS — I10 Essential (primary) hypertension: Secondary | ICD-10-CM | POA: Diagnosis not present

## 2023-02-03 LAB — BASIC METABOLIC PANEL
BUN/Creatinine Ratio: 15 (ref 9–23)
BUN: 14 mg/dL (ref 6–24)
CO2: 23 mmol/L (ref 20–29)
Calcium: 10.1 mg/dL (ref 8.7–10.2)
Chloride: 103 mmol/L (ref 96–106)
Creatinine, Ser: 0.95 mg/dL (ref 0.57–1.00)
Glucose: 107 mg/dL — ABNORMAL HIGH (ref 70–99)
Potassium: 3.2 mmol/L — ABNORMAL LOW (ref 3.5–5.2)
Sodium: 141 mmol/L (ref 134–144)
eGFR: 69 mL/min/{1.73_m2} (ref >59)

## 2023-02-03 NOTE — Patient Instructions (Signed)
Medication Instructions:  The current medical regimen is effective;  continue present plan and medications.  *If you need a refill on your cardiac medications before your next appointment, please call your pharmacy*  Follow-Up: At [facility], you and your health needs are our priority.  As part of our continuing mission to provide you with exceptional heart care, we have created designated Provider Care Teams.  These Care Teams include your primary Cardiologist (physician) and Advanced Practice Providers (APPs -  Physician Assistants and Nurse Practitioners) who all work together to provide you with the care you need, when you need it.  We recommend signing up for the patient portal called "MyChart".  Sign up information is provided on this After Visit Summary.  MyChart is used to connect with patients for Virtual Visits (Telemedicine).  Patients are able to view lab/test results, encounter notes, upcoming appointments, etc.  Non-urgent messages can be sent to your provider as well.   To learn more about what you can do with MyChart, go to [email or web address].    Your next appointment:   1 year(s)  Provider:   Robin Searing, NP

## 2023-02-03 NOTE — Progress Notes (Signed)
  Cardiology Office Note:  .   Date:  02/03/2023  ID:  Miranda [name], DOB 1965/12/08, MRN [id number] PCP: Miranda Seller, NP  [facility] Providers Cardiologist:  Donato Schultz, MD     History of Present Illness: .   Miranda [name] is a 57 y.o. female seen by Robin Searing, NP previously with hypertension AVR repair mitral valve repair 2018, 23 mm [facility] Ease pericardial tissue valve and mitral valve annuloplasty with 28 mm Sorin.  Had nonischemic cardiomyopathy EF 35% previously now resolved at 65%.  Dilated aortic root at 40 mm.  Anxiety hypothyroidism.  Previously had been on spironolactone daily but several years ago it was stopped because of symptomatic hypotension. Has been taking lisinopril carvedilol and clonidine.  After blood pressure was elevated again, spironolactone was added back 12.5 mg.  Lisinopril increased to 10 mg.  Carvedilol 12.5 mg twice a day.  Rexulti (for depression) causing sweats.  Repeatedly toweling herself off during this interview.  Beads of sweat is noted.  ROS: As above.  No chest pain fevers chills nausea vomiting.  Studies Reviewed: .        2018 cardiac catheterization no significant CAD  2021 echocardiogram-pump function normal mild to moderate mitral regurgitation postrepair, grade 2 diastolic dysfunction, mild mitral stenosis mean gradient 4 mmHg, mean gradient 9 mmHg of aortic valve replacement.  Risk Assessment/Calculations:            Physical Exam:   VS:  BP 118/70   Pulse 66   Ht 5\' 5"  (1.651 m)   Wt 140 lb 6.4 oz (63.7 kg)   LMP 01/17/2015   SpO2 93%   BMI 23.36 kg/m    Wt Readings from Last 3 Encounters:  02/03/23 140 lb 6.4 oz (63.7 kg)  01/16/23 139 lb 9.6 oz (63.3 kg)  12/09/22 140 lb (63.5 kg)    GEN: Well nourished, well developed in no acute distress, beads of sweat on forehead NECK: No JVD; No carotid bruits CARDIAC: RRR, 2/6 systolic murmurs no rubs, gallops RESPIRATORY:  Clear to auscultation without  rales, wheezing or rhonchi  ABDOMEN: Soft, non-tender, non-distended EXTREMITIES:  No edema; No deformity   ASSESSMENT AND PLAN: .    Aortic valve replacement Mitral valve repair Nonischemic cardiomyopathy, resolution of EF to normal -Overall doing well.  2/6 systolic murmur heard on exam.  Mild to moderate regurgitation mitral noted, no significant aortic stenosis in 2021 echo.  Will likely repeat at next clinic visit.  Hypertension - With addition of spironolactone 12.5 mg, she is doing very well.  Watching blood work closely.  Last potassium 3.7 creatinine 0.9.  Hyperhidrosis - States that it is from the Rexulti, depression medication.  Primary team is working on this.  Dilated aorta - Ascending aorta 40 mm, aortic root 42 mm.  Stable.  Good blood pressure control.  Hyperlipidemia - Continue with Crestor 40 mg a day LDL 78.  Nonischemic cardiomyopathy.      Dispo: 1 year APP  Signed, Donato Schultz, MD

## 2023-02-04 ENCOUNTER — Telehealth: Payer: Self-pay | Admitting: Pharmacist

## 2023-02-04 DIAGNOSIS — I10 Essential (primary) hypertension: Secondary | ICD-10-CM

## 2023-02-04 MED ORDER — POTASSIUM CHLORIDE ER 10 MEQ PO TBCR: EXTENDED_RELEASE_TABLET | ORAL | 0 refills | Status: DC

## 2023-02-04 NOTE — Telephone Encounter (Signed)
Called pt to review lab work. K is suprisingly 3.2 after starting spironolactone. Will need to see if she took any extra furosemide. Would consider supplementing short term and then rechecking. LVM for pt to call back.

## 2023-02-10 ENCOUNTER — Ambulatory Visit: Payer: PPO | Attending: Cardiology

## 2023-02-10 DIAGNOSIS — I10 Essential (primary) hypertension: Secondary | ICD-10-CM | POA: Diagnosis not present

## 2023-02-11 LAB — BASIC METABOLIC PANEL
BUN/Creatinine Ratio: 17 (ref 9–23)
BUN: 15 mg/dL (ref 6–24)
CO2: 24 mmol/L (ref 20–29)
Calcium: 10.3 mg/dL — ABNORMAL HIGH (ref 8.7–10.2)
Chloride: 103 mmol/L (ref 96–106)
Creatinine, Ser: 0.89 mg/dL (ref 0.57–1.00)
Glucose: 85 mg/dL (ref 70–99)
Potassium: 4.2 mmol/L (ref 3.5–5.2)
Sodium: 141 mmol/L (ref 134–144)
eGFR: 75 mL/min/{1.73_m2} (ref >59)

## 2023-02-11 NOTE — Addendum Note (Signed)
Addended by: Malena Peer D on: 02/11/2023 08:09 AM   Modules accepted: Orders

## 2023-02-11 NOTE — Telephone Encounter (Signed)
Patient spoke to [place] on 7/3. She was prescribed KCL 20 MEQ x 3 days then 10 MEQ daily. Last KCL tablet yesterday. K is now 4.2. Will stop KCL supplements and recheck 7/16 when she is in the office for follow up.

## 2023-02-17 ENCOUNTER — Ambulatory Visit: Payer: PPO | Attending: Cardiovascular Disease | Admitting: Pharmacist

## 2023-02-17 ENCOUNTER — Other Ambulatory Visit: Payer: Self-pay | Admitting: Nurse Practitioner

## 2023-02-17 VITALS — BP 125/85 | HR 58

## 2023-02-17 DIAGNOSIS — I428 Other cardiomyopathies: Secondary | ICD-10-CM

## 2023-02-17 DIAGNOSIS — I10 Essential (primary) hypertension: Secondary | ICD-10-CM | POA: Diagnosis not present

## 2023-02-17 DIAGNOSIS — F331 Major depressive disorder, recurrent, moderate: Secondary | ICD-10-CM

## 2023-02-17 NOTE — Progress Notes (Signed)
Patient ID: Miranda [name]                 DOB: [birth year]                      MRN: [id number]      HPI: Miranda [name] is a 57 y.o. female patient of Dr. Anne Fu referred by Robin Searing, NP to HTN clinic. PMH is significant for AVR repair and MVR repair s/p 03/2017 with 23 mm [employer] [facility]-Ease pericardial valve and MV annuloplasty with 28 mm Sorin 3D MEMO, NICM with prior EF of 35% now EF of 65%, HTN, hypothyroidism, dilated aortic root (40 mm), anxiety.   She was seen on 11/24/2022 for surgical clearance. During her visit her blood pressure was significantly elevated and patient required clonidine in office for reduction. She had clonidine 0.1 mg added to her current regimen along with carvedilol increased to 12.5 mg. She documented her blood pressures over 1 week with some improvement and was cleared for her procedure which was successful.  At follow up, blood pressure was elevated so clonidine 0.1mg  daily was added to her regimen. On 6/14 PCP increased lisinopril to 10mg  daily.   At appointment with PharmD 6/20, clonidine was stopped and spironolactone 12.5mg  daily was started. Suprisingly, K was low after starting spironolactone. She was given 20 MEQ KCL x 3 days then 10 MEQ x 4 days. K improved and KCL was stopped.  BP when she saw Dr. Anne Fu 7/2 was 118/70.   Patient presents to follow-up.  She denies any dizziness or lightheadedness, swelling shortness of breath or headaches.  She has not been back to the [facility] yet.  She brings in her home blood pressure cuff and list of readings.  Her list of readings vary from some high to some well-controlled and some more recently on the lower side.  Home cuff compared to the clinic automatic cuff found to be accurate.  135/91 clinic automatic Home cuff: 139/95  Previously on spironolactone 12.5 mg daily but it appears several years ago it was stopped due to symptomatic hypotension.  Patient with a history of heart failure with reduced ejection fraction  now recovered.  Current HTN meds: lisinopril 10mg  daily, carvedilol 12.5mg  twice a day, spironolactone 12.5mg  daily Previously tried: spironolactone 12.5mg  (stopped for low BP) BP goal: <130/80  Family History:  Family History  Problem Relation Age of Onset   Stroke Mother    Hypertension Mother    Liver disease Mother        autoimmune   Heart disease Mother    Colon polyps Mother    Stroke Father    Hypertension Father    Hypertension Maternal Grandmother    Heart disease Maternal Grandmother    Bone cancer Paternal Grandfather    Bone cancer Maternal Uncle    Arthritis Paternal Uncle    Breast cancer Cousin     Social History: Denies any tobacco use, reports drinks about 4 alcoholic drinks weekly  Diet:  Drink: water, flavored water, decaf soda Breakfast: no appetite for breakfast Lunch: sometimes doesn't eat, sandwich w/ tuna, deli meat, leftovers Dinner: chicken, salads - eats both out and at home  Exercise:  Previously went to the [facility]- not since sept when she broke her ankle  Home BP readings:  108/73 126/85 128/88 134/93 104/73 127/85 132/80 156/93 117/76 108/61 110/73 90/59 76/53  88/61 91/61  HR 60-70's Wt Readings from Last 3 Encounters:  02/03/23 140 lb 6.4 oz (63.7 kg)  01/16/23 139 lb 9.6 oz (63.3 kg)  12/09/22 140 lb (63.5 kg)   BP Readings from Last 3 Encounters:  02/17/23 125/85  02/03/23 118/70  01/22/23 (!) 144/90   Pulse Readings from Last 3 Encounters:  02/17/23 (!) 58  02/03/23 66  01/22/23 62    Renal function: CrCl cannot be calculated (Unknown ideal weight.).  Past Medical History:  Diagnosis Date   Anemia    Anxiety    At risk for sudden cardiac death 24-Nov-2012   CHF - Combined Systolic + Diastolic Dysfunction. EF of 15-20% with Grade II diastolic dysfunction on echo 10/27/12 10/28/2012   CKD (chronic kidney disease)    Coronary artery disease    Depression    .   Dyspnea    none per pt on 04/23/22   Fibroids     Heart murmur    History of blood transfusion 2014   Hypertension    Hypothyroidism    S/P cardiac catheterization, Nov 24, 2012, normal coronaries with minimal luminal irregularities in RCA system 10/30/2012   Thyroid disease    hypothyroidism    Current Outpatient Medications on File Prior to Visit  Medication Sig Dispense Refill   acetaminophen (TYLENOL) 500 MG tablet Take 1,000 mg by mouth every 6 (six) hours as needed for mild pain.     aspirin EC 81 MG EC tablet Take 1 tablet (81 mg total) by mouth daily.     carvedilol (COREG) 12.5 MG tablet Take 1 tablet by mouth twice daily 180 tablet 3   Cholecalciferol (D3) 50 MCG (2000 UT) TABS Take 2,000 Units by mouth daily.     ferrous sulfate 325 (65 FE) MG EC tablet Take 1 tablet (325 mg total) by mouth 2 (two) times daily. 180 tablet 3   fluticasone (FLONASE) 50 MCG/ACT nasal spray Place 1 spray into both nostrils 2 (two) times daily as needed for allergies or rhinitis. 16 g 5   furosemide (LASIX) 40 MG tablet Take 1 tablet by mouth once daily 90 tablet 3   levothyroxine (SYNTHROID) 100 MCG tablet TAKE 1 TABLET BY MOUTH ONCE DAILY BEFORE BREAKFAST 90 tablet 1   lisinopril (ZESTRIL) 10 MG tablet Take 0.5 tablets (5 mg total) by mouth daily.     melatonin 1 MG TABS tablet Take 1 mg by mouth at bedtime.     mirtazapine (REMERON) 15 MG tablet Start with half tablet daily at bedtime for 1 week then increase to whole tablet for 1 week then increase to 2 tablet daily at bedtime 60 tablet 1   REXULTI 1 MG TABS tablet Take 1 tablet by mouth once daily 30 tablet 0   rosuvastatin (CRESTOR) 40 MG tablet Take 1 tablet by mouth once daily 90 tablet 3   spironolactone (ALDACTONE) 25 MG tablet Take 0.5 tablets (12.5 mg total) by mouth daily. 15 tablet 3   traZODone (DESYREL) 100 MG tablet 100 mg by mouth at bedtime daily for 1 week then decrease to 50 mg daily for 1 week then stop. 15 tablet 0   No current facility-administered medications on file prior to  visit.    Allergies  Allergen Reactions   Codeine Hives   Wellbutrin [Bupropion]     Excessive sweating     Blood pressure 125/85, pulse (!) 58, last menstrual period 01/17/2015.   Assessment/Plan:     1. Hypertension -  HTN (hypertension) Assessment: Blood pressure in clinic slightly above goal of less than 130/80 Had several home readings over the last few  days that were less than 100 systolic Denies any symptoms of low blood pressure She had a good response to spironolactone therefore hesitant to stop it  Plan: Decrease lisinopril to 5 mg daily Continue spironolactone 12.5 mg daily and carvedilol 12.5 mg twice a day Continue monitoring blood pressure at home Follow-up in 3 weeks Check BMP today    Thank you  Olene Floss, Pharm.D, BCACP, BCPS, CPP [facility] of [facility] [address] [place], [place] [address]  Phone: [phone]; Fax: [phone]

## 2023-02-17 NOTE — Patient Instructions (Addendum)
Decrease lisinopril to 5mg  daily Continue carvedilol 12.5mg  twice a day and spironolactone 12.5mg  daily Continue checking blood pressure at home  Your blood pressure goal is < 130/72mmHg  Important lifestyle changes to control high blood pressure  Intervention  Effect on the BP   Weight loss Weight loss is one of the most effective lifestyle changes for controlling blood pressure. If you're overweight or obese, losing even a small amount of weight can help reduce blood pressure.    Blood pressure can decrease by 1 millimeter of mercury (mmHg) with each kilogram (about 2.2 pounds) of weight lost.   Exercise regularly As a general goal, aim for 30 minutes of moderate physical activity every day.    Regular physical activity can lower blood pressure by 5 - 8 mmHg.   Eat a healthy diet Eat a diet rich in whole grains, fruits, vegetables, lean meat, and low-fat dairy products. Limit processed foods, saturated fat, and sweets.    A heart-healthy diet can lower high blood pressure by 10 mmHg.   Reduce salt (sodium) in your diet Aim for 000mg  of sodium each day. Avoid deli meats, canned food, and frozen microwave meals which are high in sodium.     Limiting sodium can reduce blood pressure by 5 mmHg.   Limit alcohol One drink equals 12 ounces of beer, 5 ounces of wine, or 1.5 ounces of 80-proof liquor.    Limiting alcohol to < 1 drink a day for women or < 2 drinks a day for men can help lower blood pressure by about 4 mmHg.   To check your pressure at home you will need to:   Sit up in a chair, with feet flat on the floor and back supported. Do not cross your ankles or legs. Rest your left arm so that the cuff is about heart level. If the cuff goes on your upper arm, then just relax your arm on the table, arm of the chair, or your lap. If you have a wrist cuff, hold your wrist against your chest at heart level. Place the cuff snugly around your arm, about 1 inch above the crease  of your elbow. The cords should be inside the groove of your elbow.  Sit quietly, with the cuff in place, for about 5 minutes. Then press the power button to start a reading. Do not talk or move while the reading is taking place.  Record your readings on a sheet of paper. Although most cuffs have a memory, it is often easier to see a pattern developing when the numbers are all in front of you.  You can repeat the reading after 1-3 minutes if it is recommended.   Make sure your bladder is empty and you have not had caffeine or tobacco within the last 30 minutes   Always bring your blood pressure log with you to your appointments. If you have not brought your monitor in to be double checked for accuracy, please bring it to your next appointment.   You can find a list of validated (accurate) blood pressure cuffs at: WirelessNovelties.no   Yes no 10 heart failure right yet that are both

## 2023-02-17 NOTE — Telephone Encounter (Signed)
High Risk Warning Populated when attempting to refill, I will send to Provider for further review 

## 2023-02-17 NOTE — Assessment & Plan Note (Signed)
Assessment: Blood pressure in clinic slightly above goal of less than 130/80 Had several home readings over the last few days that were less than 100 systolic Denies any symptoms of low blood pressure She had a good response to spironolactone therefore hesitant to stop it  Plan: Decrease lisinopril to 5 mg daily Continue spironolactone 12.5 mg daily and carvedilol 12.5 mg twice a day Continue monitoring blood pressure at home Follow-up in 3 weeks Check BMP today

## 2023-02-18 LAB — BASIC METABOLIC PANEL
BUN/Creatinine Ratio: 18 (ref 9–23)
BUN: 16 mg/dL (ref 6–24)
CO2: 23 mmol/L (ref 20–29)
Calcium: 10.3 mg/dL — ABNORMAL HIGH (ref 8.7–10.2)
Chloride: 105 mmol/L (ref 96–106)
Creatinine, Ser: 0.88 mg/dL (ref 0.57–1.00)
Glucose: 93 mg/dL (ref 70–99)
Potassium: 4.2 mmol/L (ref 3.5–5.2)
Sodium: 142 mmol/L (ref 134–144)
eGFR: 76 mL/min/{1.73_m2} (ref >59)

## 2023-02-26 ENCOUNTER — Telehealth: Payer: HMO

## 2023-02-26 DIAGNOSIS — F331 Major depressive disorder, recurrent, moderate: Secondary | ICD-10-CM

## 2023-02-26 NOTE — Telephone Encounter (Signed)
Patient called regarding mirtazapine. She states that she has been taking 2 tablets at bedtime and current prescription states take one tablet at bedtime. She would like to  know how she should be taking this medication.  Message sent to Abbey Chatters, NP

## 2023-02-27 DIAGNOSIS — E039 Hypothyroidism, unspecified: Secondary | ICD-10-CM | POA: Diagnosis not present

## 2023-02-27 DIAGNOSIS — I110 Hypertensive heart disease with heart failure: Secondary | ICD-10-CM | POA: Diagnosis not present

## 2023-02-27 DIAGNOSIS — I509 Heart failure, unspecified: Secondary | ICD-10-CM | POA: Diagnosis not present

## 2023-02-27 DIAGNOSIS — F339 Major depressive disorder, recurrent, unspecified: Secondary | ICD-10-CM | POA: Diagnosis not present

## 2023-02-27 NOTE — Telephone Encounter (Signed)
Goal is to get to 30 mg daily- what is the mg of her tablets.

## 2023-03-03 MED ORDER — MIRTAZAPINE 30 MG PO TABS
ORAL_TABLET | ORAL | 1 refills | Status: DC
Start: 2023-03-03 — End: 2023-05-25

## 2023-03-03 NOTE — Telephone Encounter (Signed)
New Rx for remeron 30 sent to pharmacy She should also be off her trazodone.

## 2023-03-03 NOTE — Telephone Encounter (Signed)
Patient notified

## 2023-03-03 NOTE — Telephone Encounter (Signed)
She is taking the 15mg  tablet 2 tablets daily.

## 2023-03-09 ENCOUNTER — Telehealth: Payer: Self-pay | Admitting: *Deleted

## 2023-03-09 NOTE — Telephone Encounter (Signed)
Okay to stop medication, recommend that she follow up with a psychiatrist if she feels like her mood is not controlled off medication

## 2023-03-09 NOTE — Telephone Encounter (Signed)
Patient called and stated that she is having side effects from the Rexulti. Stated that it is giving her Stomach Cramps. Requesting to Discontinue.   Please Advise.

## 2023-03-09 NOTE — Telephone Encounter (Signed)
Patient notified and agreed.  

## 2023-03-10 ENCOUNTER — Telehealth: Payer: Self-pay | Admitting: *Deleted

## 2023-03-10 ENCOUNTER — Encounter: Payer: Self-pay | Admitting: Nurse Practitioner

## 2023-03-10 ENCOUNTER — Telehealth (INDEPENDENT_AMBULATORY_CARE_PROVIDER_SITE_OTHER): Payer: PPO | Admitting: Nurse Practitioner

## 2023-03-10 DIAGNOSIS — F5101 Primary insomnia: Secondary | ICD-10-CM | POA: Diagnosis not present

## 2023-03-10 DIAGNOSIS — R61 Generalized hyperhidrosis: Secondary | ICD-10-CM | POA: Diagnosis not present

## 2023-03-10 DIAGNOSIS — F331 Major depressive disorder, recurrent, moderate: Secondary | ICD-10-CM

## 2023-03-10 MED ORDER — BUPROPION HCL ER (XL) 150 MG PO TB24
150.0000 mg | ORAL_TABLET | Freq: Every day | ORAL | 1 refills | Status: DC
Start: 2023-03-10 — End: 2023-08-25

## 2023-03-10 NOTE — Progress Notes (Signed)
Careteam: Patient Care Team: Sharon Seller, NP as PCP - General (Geriatric Medicine) Jake Bathe, MD as PCP - Cardiology (Cardiology) Allie Bossier, MD as Consulting Physician (Obstetrics and Gynecology) Gala Romney, Bevelyn Buckles, MD as Consulting Physician (Cardiology) Suanne Marker, MD as Consulting Physician (Neurology)  Advanced Directive information Does Patient Have a Medical Advance Directive?: No, Would patient like information on creating a medical advance directive?: No - Patient declined  Allergies  Allergen Reactions   Codeine Hives   Wellbutrin [Bupropion]     Excessive sweating     Chief Complaint  Patient presents with   Acute Visit    Wants to discus going back to taking the old dosage of medication of Trazodone, Wellbutrin and Mirtazapine.      HPI: Patient is a 57 y.o. female due to medication review  Rexulti gave her stomach cramps so she is not able to take.  She reports she was having them all along but did not realize it was from rexulti.  She has stopped medication and now cramps have resolved.   She has been on Wellbutrin in the past but caused excessive sweating and she could not tolerate medication.  She stopped Wellbutrin and sweating resolved.  In the past did not want to continue Wellbutrin but mood has not been doing well so she wants to try again.    She is currently taking mirtazapine 30 mg at bedtime. She is not sleeping well.  She has tried trazodone and went up on this but did not feel like it helped sleep and mood therefore mirtazepine was restarted and trazodone titrated off.   Reports mood during the day is [employer]. Reports anxiety during the day.  She felt like overall her mood was doing better on trazodone and mirtazapine.   Review of Systems:  Review of Systems  Constitutional:  Negative for chills, fever, malaise/fatigue and weight loss.  Psychiatric/Behavioral:  Positive for depression. The patient is  nervous/anxious and has insomnia.     Past Medical History:  Diagnosis Date   Anemia    Anxiety    At risk for sudden cardiac death 11-24-12   CHF - Combined Systolic + Diastolic Dysfunction. EF of 15-20% with Grade II diastolic dysfunction on echo 10/27/12 10/28/2012   CKD (chronic kidney disease)    Coronary artery disease    Depression    .   Dyspnea    none per pt on 04/23/22   Fibroids    Heart murmur    History of blood transfusion 2014   Hypertension    Hypothyroidism    S/P cardiac catheterization, 2012-11-24, normal coronaries with minimal luminal irregularities in RCA system 10/30/2012   Thyroid disease    hypothyroidism   Past Surgical History:  Procedure Laterality Date   AORTIC VALVE REPLACEMENT N/A 03/12/2017   Procedure: AORTIC VALVE REPLACEMENT (AVR) with Mitral Annuloplasty;  Surgeon: Alleen Borne, MD;  Location: [facility] OR;  Service: Open Heart Surgery;  Laterality: N/A;   EXTERNAL FIXATION LEG Right 04/24/2022   Procedure: EXTERNAL FIXATION RIGHT LEG;  Surgeon: Netta Cedars, MD;  Location: [facility] OR;  Service: Orthopedics;  Laterality: Right;   EXTERNAL FIXATION REMOVAL Right 05/07/2022   Procedure: REMOVAL EXTERNAL FIXATION LEG right;  Surgeon: Netta Cedars, MD;  Location: [facility] OR;  Service: Orthopedics;  Laterality: Right;  120 min   HARDWARE REMOVAL Right 11/26/2022   Procedure: Right ankle removal of hardware (syndesmotic screws x2);  Surgeon: Netta Cedars, MD;  Location: [facility];  Service: Orthopedics;  Laterality: Right;  60   LEFT AND RIGHT HEART CATHETERIZATION WITH CORONARY ANGIOGRAM N/A 10/29/2012   Procedure: LEFT AND RIGHT HEART CATHETERIZATION WITH CORONARY ANGIOGRAM;  Surgeon: Marykay Lex, MD;  Location: [facility] CATH LAB;  Service: Cardiovascular;  Laterality: N/A;   MITRAL VALVE REPAIR N/A 03/12/2017   Procedure: MITRAL VALVE REPAIR (MVR);  Surgeon: Alleen Borne, MD;  Location: [facility];  Service: Open Heart Surgery;  Laterality:  N/A;  mitral annuloplasty   MULTIPLE EXTRACTIONS WITH ALVEOLOPLASTY N/A 01/21/2017   Procedure: Extraction of tooth #'s 4-13 with alveoloplasty;  Surgeon: Charlynne Pander, DDS;  Location: [facility] OR;  Service: Oral Surgery;  Laterality: N/A;   ORIF ANKLE FRACTURE Right 05/07/2022   Procedure: OPEN REDUCTION INTERNAL FIXATION (ORIF)right  ANKLE FRACTURE possible syndesmosis and/or deltoid fixation, possible allograft;  Surgeon: Netta Cedars, MD;  Location: [facility] OR;  Service: Orthopedics;  Laterality: Right;  120 min   RIGHT/LEFT HEART CATH AND CORONARY ANGIOGRAPHY N/A 01/09/2017   Procedure: Right/Left Heart Cath and Coronary Angiography;  Surgeon: Lennette Bihari, MD;  Location: [facility] INVASIVE CV LAB;  Service: Cardiovascular;  Laterality: N/A;   TEE WITHOUT CARDIOVERSION N/A 03/12/2017   Procedure: TRANSESOPHAGEAL ECHOCARDIOGRAM (TEE);  Surgeon: Alleen Borne, MD;  Location: [facility] OR;  Service: Open Heart Surgery;  Laterality: N/A;   Social History:   reports that she quit smoking about 44 years ago. Her smoking use included cigars and cigarettes. She started smoking about 49 years ago. She has a 2.5 pack-year smoking history. She has never used smokeless tobacco. She reports current alcohol use of about 5.0 standard drinks of alcohol per week. She reports that she does not currently use drugs after having used the following drugs: Marijuana. Frequency: 5.00 times per week.  Family History  Problem Relation Age of Onset   Stroke Mother    Hypertension Mother    Liver disease Mother        autoimmune   Heart disease Mother    Colon polyps Mother    Stroke Father    Hypertension Father    Hypertension Maternal Grandmother    Heart disease Maternal Grandmother    Bone cancer Paternal Grandfather    Bone cancer Maternal Uncle    Arthritis Paternal Uncle    Breast cancer Cousin     Medications: Patient's Medications  New Prescriptions   No medications on file  Previous Medications    ACETAMINOPHEN (TYLENOL) 500 MG TABLET    Take 1,000 mg by mouth every 6 (six) hours as needed for mild pain.   ASPIRIN EC 81 MG EC TABLET    Take 1 tablet (81 mg total) by mouth daily.   CARVEDILOL (COREG) 12.5 MG TABLET    Take 1 tablet by mouth twice daily   CHOLECALCIFEROL (D3) 50 MCG (2000 UT) TABS    Take 2,000 Units by mouth daily.   FERROUS SULFATE 325 (65 FE) MG EC TABLET    Take 1 tablet (325 mg total) by mouth 2 (two) times daily.   FLUTICASONE (FLONASE) 50 MCG/ACT NASAL SPRAY    Place 1 spray into both nostrils 2 (two) times daily as needed for allergies or rhinitis.   FUROSEMIDE (LASIX) 40 MG TABLET    Take 1 tablet by mouth once daily   LEVOTHYROXINE (SYNTHROID) 100 MCG TABLET    TAKE 1 TABLET BY MOUTH ONCE DAILY BEFORE BREAKFAST   LISINOPRIL (ZESTRIL) 10 MG TABLET  Take 0.5 tablets (5 mg total) by mouth daily.   MELATONIN 1 MG TABS TABLET    Take 1 mg by mouth at bedtime.   MIRTAZAPINE (REMERON) 30 MG TABLET    TAKE 1 TABLET BY MOUTH ONCE DAILY AT BEDTIMETAKE 1 TABLET BY MOUTH ONCE DAILY AT BEDTIME   ROSUVASTATIN (CRESTOR) 40 MG TABLET    Take 1 tablet by mouth once daily   SPIRONOLACTONE (ALDACTONE) 25 MG TABLET    Take 0.5 tablets (12.5 mg total) by mouth daily.  Modified Medications   No medications on file  Discontinued Medications   REXULTI 1 MG TABS TABLET    Take 1 tablet by mouth once daily    Physical Exam:  There were no vitals filed for this visit. There is no height or weight on file to calculate BMI. Wt Readings from Last 3 Encounters:  02/03/23 140 lb 6.4 oz (63.7 kg)  01/16/23 139 lb 9.6 oz (63.3 kg)  12/09/22 140 lb (63.5 kg)    Physical Exam Constitutional:      Appearance: Normal appearance.  Pulmonary:     Effort: Pulmonary effort is normal.  Neurological:     Mental Status: She is alert. Mental status is at baseline.  Psychiatric:        Mood and Affect: Mood normal.     Labs reviewed: Basic Metabolic Panel: Recent Labs    09/19/22 1044  11/24/22 1400 02/03/23 0930 02/10/23 0948 02/17/23 0958  NA 141   < > 141 141 142  K 4.2   < > 3.2* 4.2 4.2  CL 108   < > 103 103 105  CO2 22   < > 23 24 23   GLUCOSE 99   < > 107* 85 93  BUN 14   < > 14 15 16   CREATININE 0.92   < > 0.95 0.89 0.88  CALCIUM 9.9   < > 10.1 10.3* 10.3*  TSH 3.35  --   --   --   --    < > = values in this interval not displayed.   Liver Function Tests: Recent Labs    04/21/22 0951 09/19/22 1044  AST 21 19  ALT 8 9  BILITOT 0.5 0.3  PROT 9.0* 8.5*   No results for input(s): "LIPASE", "AMYLASE" in the last 8760 hours. No results for input(s): "AMMONIA" in the last 8760 hours. CBC: Recent Labs    04/21/22 0951 09/19/22 1044  WBC 6.2 4.4  NEUTROABS 3,968 2,266  HGB 12.0 12.4  HCT 37.2 37.9  MCV 86.9 82.8  PLT 186 139*   Lipid Panel: Recent Labs    05/19/22 1039  CHOL 151  HDL 56  LDLCALC 78  TRIG 91  CHOLHDL 2.7   TSH: Recent Labs    09/19/22 1044  TSH 3.35   A1C: Lab Results  Component Value Date   HGBA1C 5.4 06/08/2019     Assessment/Plan 1. Moderate episode of recurrent major depressive disorder (HCC) -do not want to add back trazodone with Remeron due to risk of serotonin syndrome and combination was not effective in improving mood. She would like to restart wellbutrin as it had helped the mood the most in the past despite side effect.  - buPROPion (WELLBUTRIN XL) 150 MG 24 hr tablet; Take 1 tablet (150 mg total) by mouth daily.  Dispense: 30 tablet; Refill: 1  2. Excessive sweating Noted to have excessive sweating on wellbutrin and rexulti, reports she would like to restart wellburtin despite  the side effect  3. Primary insomnia -discussed cognitive behavioral therapy -can increase melatonin to 3 mg at bedtime- can incrase up to 6 mg if needed   To keep follow up appt in 1 month to discuss mood. Discussed setting up appt with psychiatrist for better management but she declined at this time.  Janene Harvey. Biagio Borg  [facility] [phone]    Virtual Visit via video  I connected with patient on 03/10/23 at  9:00 AM EDT by mychart and verified that I am speaking with the correct person using two identifiers.  Location: Patient: home Provider: [facility]   I discussed the limitations, risks, security and privacy concerns of performing an evaluation and management service by telephone and the availability of in person appointments. I also discussed with the patient that there may be a patient responsible charge related to this service. The patient expressed understanding and agreed to proceed.   I discussed the assessment and treatment plan with the patient. The patient was provided an opportunity to ask questions and all were answered. The patient agreed with the plan and demonstrated an understanding of the instructions.   The patient was advised to call back or seek an in-person evaluation if the symptoms worsen or if the condition fails to improve as anticipated.  I provided 20 minutes of non-face-to-face time during this encounter.  Janene Harvey. Biagio Borg Avs printed and mailed

## 2023-03-10 NOTE — Progress Notes (Signed)
  This service is provided via telemedicine  No vital signs collected/recorded due to the encounter was a telemedicine visit.   Location of patient (ex: home, work):  Home  Patient consents to a telephone visit:  Yes  Location of the provider (ex: office, home):  Office [place].   Name of any referring provider:  na  Names of all persons participating in the telemedicine service and their role in the encounter:  [name], Patient, Nelda Severe, CMA, Abbey Chatters, NP  Time spent on call:  7:15

## 2023-03-10 NOTE — Telephone Encounter (Signed)
Ms. kaisley, belmonte are scheduled for a virtual visit with your provider today.    Just as we do with appointments in the office, we must obtain your consent to participate.  Your consent will be active for this visit and any virtual visit you  have with one of our providers in the next 365 days.    If you have a MyChart account, I can also send a copy of this consent to you electronically.  All virtual visits are billed to your insurance company just like a traditional visit in the office.  As this is a virtual visit, video technology does not allow for your provider to perform a traditional examination.  This  limit your provider's ability to fully assess your condition.  If your provider identifies any concerns that need to be evaluated in person or the need to arrange testing such as labs, EKG, etc, we will make arrangements to do so.    Although advances in technology are sophisticated, we cannot ensure that it will always work on either your end or our end.  If the connection with a video visit is poor, we  have to switch to a telephone visit.  With either a video or telephone visit, we are not always able to ensure that we have a secure connection.   I need to obtain your verbal consent now.   Are you willing to proceed with your visit today?   Pennye Crombie has provided verbal consent on 03/10/2023 for a virtual visit (video or telephone).   MayBeckey Downing, [place] 03/10/2023  9:04 AM

## 2023-03-11 ENCOUNTER — Ambulatory Visit: Payer: PPO | Attending: Cardiology | Admitting: Pharmacist

## 2023-03-11 VITALS — BP 104/70 | HR 57

## 2023-03-11 DIAGNOSIS — I10 Essential (primary) hypertension: Secondary | ICD-10-CM | POA: Diagnosis not present

## 2023-03-11 MED ORDER — LISINOPRIL 2.5 MG PO TABS: 2.5000 mg | ORAL_TABLET | Freq: Every day | ORAL | 3 refills | Status: DC

## 2023-03-11 NOTE — Patient Instructions (Signed)
Your blood pressure is at goal < 130/61mmHg however some of your readings are running on the lower end which may be contributing to your occasional lightheadedness  Decrease your lisinopril to 2.5mg  once daily - I have sent in an updated prescription to your pharmacy  Continue taking your other medications, monitoring and recording your blood pressure  I'll call you in 2 weeks to see how your readings are looking  We do not want your top blood pressure running under 100 - this is a bit too low

## 2023-03-11 NOTE — Progress Notes (Addendum)
Patient ID: Miranda [name]                 DOB: 03-29-1966                      MRN: [id number]      HPI: Miranda [name] is a 57 y.o. female patient of Dr. Anne Fu referred by Robin Searing, NP to HTN clinic. PMH is significant for AVR repair and MVR repair s/p 03/2017 with 23 mm Edwards Magna-Ease pericardial valve and MV annuloplasty with 28 mm Sorin 3D MEMO, NICM with prior EF of 35% now EF of 65%, HTN, hypothyroidism, dilated aortic root (40 mm), anxiety. She was seen on 11/24/2022 for surgical clearance. During her visit her blood pressure was significantly elevated and patient required clonidine in office for reduction. She had clonidine 0.1 mg added to her current regimen along with carvedilol increased to 12.5 mg. She documented her blood pressures over 1 week with some improvement and was cleared for her procedure which was successful.  At follow up, blood pressure was elevated so clonidine 0.1mg  daily was added to her regimen. On 6/14 PCP increased lisinopril to 10mg  daily.   At appointment with PharmD 6/20, clonidine was stopped and spironolactone 12.5mg  daily was started. Suprisingly, K was low after starting spironolactone. She was given 20 MEQ KCL x 3 days then 10 MEQ x 4 days. K improved and KCL was stopped. Previously on spironolactone 12.5 mg daily but it appears several years ago it was stopped due to symptomatic hypotension. At last pharmacy visit on 02/17/23, pt brought in home BP log showing some systolics <100 although in office BP was 135/91. Home BF cuff was tested and was measuring accurately. Lisinopril was decreased to 5 mg daily and carvedilol and spironolactone were continued at current doses. BMET on 7/16 showed K WNL (4.2) and Scr at baseline (0.88).   Patient presents to follow-up feeling okay this morning. She reports occasional lightheadedness that correlates with lower home BP readings. Of note, has not taken lisinopril yet this morning and in office BP is 104/70. Also taking  furosemide 40 mg daily for LEE which she reports is well controlled. Of note, spironolactone is not listed in fill history although she reports taking daily in the evening. Denies headaches, blurry vision, or LE edema.  Current HTN meds: lisinopril 5mg  daily (AM), carvedilol 12.5mg  twice a day, spironolactone 12.5mg  daily (PM), furosemide 40 mg daily  Previously tried: spironolactone 12.5mg  (stopped for low BP) BP goal: <130/80  Family History:  Family History  Problem Relation Age of Onset   Stroke Mother    Hypertension Mother    Liver disease Mother        autoimmune   Heart disease Mother    Colon polyps Mother    Stroke Father    Hypertension Father    Hypertension Maternal Grandmother    Heart disease Maternal Grandmother    Bone cancer Paternal Grandfather    Bone cancer Maternal Uncle    Arthritis Paternal Uncle    Breast cancer Cousin     Social History: Denies any tobacco use, reports drinks about 4 alcoholic drinks weekly  Diet:  Drink: water, flavored water, decaf soda Breakfast: no appetite for breakfast Lunch: sometimes doesn't eat, sandwich w/ tuna, deli meat, leftovers Dinner: chicken, salads - eats both out and at home  Exercise:  Previously went to the [facility]- not since sept when she broke her ankle  Home BP/Pulse readings:  121/79,  70 116/84, 68 113/79, 66 111/70, 70 102/73, 81 118/60 98/66, 65 125/90, 70 114/83, 83 76/56, 72 99/62, 75 131/97, 77  Wt Readings from Last 3 Encounters:  02/03/23 140 lb 6.4 oz (63.7 kg)  01/16/23 139 lb 9.6 oz (63.3 kg)  12/09/22 140 lb (63.5 kg)   BP Readings from Last 3 Encounters:  03/11/23 104/70  02/17/23 125/85  02/03/23 118/70   Pulse Readings from Last 3 Encounters:  03/11/23 (!) 57  02/17/23 (!) 58  02/03/23 66    Renal function: CrCl cannot be calculated (Patient's most recent lab result is older than the maximum 21 days allowed.).  Past Medical History:  Diagnosis Date   Anemia    Anxiety     At risk for sudden cardiac death 11/25/2012   CHF - Combined Systolic + Diastolic Dysfunction. EF of 15-20% with Grade II diastolic dysfunction on echo 10/27/12 10/28/2012   CKD (chronic kidney disease)    Coronary artery disease    Depression    .   Dyspnea    none per pt on 04/23/22   Fibroids    Heart murmur    History of blood transfusion 2014   Hypertension    Hypothyroidism    S/P cardiac catheterization, November 25, 2012, normal coronaries with minimal luminal irregularities in RCA system 10/30/2012   Thyroid disease    hypothyroidism    Current Outpatient Medications on File Prior to Visit  Medication Sig Dispense Refill   acetaminophen (TYLENOL) 500 MG tablet Take 1,000 mg by mouth every 6 (six) hours as needed for mild pain.     aspirin EC 81 MG EC tablet Take 1 tablet (81 mg total) by mouth daily.     buPROPion (WELLBUTRIN XL) 150 MG 24 hr tablet Take 1 tablet (150 mg total) by mouth daily. 30 tablet 1   carvedilol (COREG) 12.5 MG tablet Take 1 tablet by mouth twice daily 180 tablet 3   Cholecalciferol (D3) 50 MCG (2000 UT) TABS Take 2,000 Units by mouth daily.     ferrous sulfate 325 (65 FE) MG EC tablet Take 1 tablet (325 mg total) by mouth 2 (two) times daily. 180 tablet 3   fluticasone (FLONASE) 50 MCG/ACT nasal spray Place 1 spray into both nostrils 2 (two) times daily as needed for allergies or rhinitis. 16 g 5   furosemide (LASIX) 40 MG tablet Take 1 tablet by mouth once daily 90 tablet 3   levothyroxine (SYNTHROID) 100 MCG tablet TAKE 1 TABLET BY MOUTH ONCE DAILY BEFORE BREAKFAST 90 tablet 0   lisinopril (ZESTRIL) 10 MG tablet Take 0.5 tablets (5 mg total) by mouth daily.     melatonin 1 MG TABS tablet Take 1 mg by mouth at bedtime.     mirtazapine (REMERON) 30 MG tablet TAKE 1 TABLET BY MOUTH ONCE DAILY AT BEDTIMETAKE 1 TABLET BY MOUTH ONCE DAILY AT BEDTIME 30 tablet 1   rosuvastatin (CRESTOR) 40 MG tablet Take 1 tablet by mouth once daily 90 tablet 3   spironolactone  (ALDACTONE) 25 MG tablet Take 0.5 tablets (12.5 mg total) by mouth daily. 15 tablet 3   No current facility-administered medications on file prior to visit.    Allergies  Allergen Reactions   Codeine Hives   Rexulti [Brexpiprazole]     Stomach cramps   Wellbutrin [Bupropion]     Excessive sweating     Blood pressure 104/70, pulse (!) 57, last menstrual period 01/17/2015, SpO2 97%.   Assessment/Plan:  1. Hypertension - Well  controlled based on home BP readings and in office BP today <130/80 mm Hg. However, given pt reported symptomatic hypotension that correlates with lower home BP readings will decrease lisinopril to 2.5 mg daily. Considered discontinuing lisinopril today, but given hx of HFimpEF will try lowest dose of lisinopril first and continue carvedilol and spironolactone to continue GDMT as able. Will follow up with patient via phone in 2 weeks to assess improvement in symptomatic hypotension. Counseled pt to continue checking BP at home and keep a log to bring to visits. Pt has PCP follow up in 6 weeks.   Adam Phenix, PharmD PGY-1 Pharmacy Resident  Megan E. Supple, PharmD, BCACP, CPP [facility] [address] [place], [place] [address] Phone: [phone]; Fax: [phone] 03/11/2023 9:03 AM

## 2023-03-25 ENCOUNTER — Telehealth: Payer: Self-pay | Admitting: Pharmacist

## 2023-03-25 NOTE — Telephone Encounter (Signed)
Called pt today to follow up on BP readings and dizziness since decreasing dose of lisinopril to 2.5 mg daily. She reports improvement in lightheadedness over the past 2 weeks. Only has mild dizziness 1-2x/week when bending over or changing positions. She was not at home so could not review log of home BP readings, but she recalls SBP 110s and all values <130. Will continue current medications and counseled her to continue checking BP at home. Has follow up scheduled with PCP on 04/20/23.

## 2023-04-20 ENCOUNTER — Ambulatory Visit (INDEPENDENT_AMBULATORY_CARE_PROVIDER_SITE_OTHER): Payer: PPO | Admitting: Nurse Practitioner

## 2023-04-20 ENCOUNTER — Encounter: Payer: Self-pay | Admitting: Nurse Practitioner

## 2023-04-20 VITALS — BP 110/74 | HR 75 | Temp 98.0°F | Resp 14 | Ht 65.0 in | Wt 144.0 lb

## 2023-04-20 DIAGNOSIS — E782 Mixed hyperlipidemia: Secondary | ICD-10-CM | POA: Diagnosis not present

## 2023-04-20 DIAGNOSIS — E039 Hypothyroidism, unspecified: Secondary | ICD-10-CM

## 2023-04-20 DIAGNOSIS — I428 Other cardiomyopathies: Secondary | ICD-10-CM | POA: Diagnosis not present

## 2023-04-20 DIAGNOSIS — D500 Iron deficiency anemia secondary to blood loss (chronic): Secondary | ICD-10-CM

## 2023-04-20 DIAGNOSIS — F331 Major depressive disorder, recurrent, moderate: Secondary | ICD-10-CM | POA: Diagnosis not present

## 2023-04-20 DIAGNOSIS — I10 Essential (primary) hypertension: Secondary | ICD-10-CM | POA: Diagnosis not present

## 2023-04-20 DIAGNOSIS — R61 Generalized hyperhidrosis: Secondary | ICD-10-CM | POA: Diagnosis not present

## 2023-04-20 DIAGNOSIS — Z23 Encounter for immunization: Secondary | ICD-10-CM

## 2023-04-20 DIAGNOSIS — I5022 Chronic systolic (congestive) heart failure: Secondary | ICD-10-CM | POA: Diagnosis not present

## 2023-04-20 NOTE — Progress Notes (Signed)
Careteam: Patient Care Team: Sharon Seller, NP as PCP - General (Geriatric Medicine) Jake Bathe, MD as PCP - Cardiology (Cardiology) Allie Bossier, MD as Consulting Physician (Obstetrics and Gynecology) Gala Romney, Bevelyn Buckles, MD as Consulting Physician (Cardiology) Marjory Lies, Glenford Bayley, MD as Consulting Physician (Neurology)  PLACE OF SERVICE:  [facility]  Advanced Directive information Does Patient Have a Medical Advance Directive?: No  Allergies  Allergen Reactions   Codeine Hives   Rexulti [Brexpiprazole]     Stomach cramps   Wellbutrin [Bupropion]     Excessive sweating     Chief Complaint  Patient presents with   Follow-up    3 month follow up     HPI: Patient is a 57 y.o. female presents for 3 month follow-up. No concerns at this time.  Wellbutrin was restarted at last visit due to worsening mood. She reports she is doing much better now that Wellbutrin has restarted. States still sweating with wellbutrin but states she will just deal with it because wellbutrin helps. Using remeron and melatonin for sleep which has been controlled.  Sees cardiology for her HTN, CHF, cardiomyopathy. No issues with LE edema, chest pains, shortness of breath.   States her right ankle is doing good at this time.   Review of Systems:  Review of Systems  Constitutional:  Negative for chills, fever, malaise/fatigue and weight loss.  Respiratory:  Negative for cough and shortness of breath.   Cardiovascular:  Negative for chest pain, palpitations and leg swelling.  Gastrointestinal:  Negative for abdominal pain, constipation, diarrhea, nausea and vomiting.  Musculoskeletal:  Negative for myalgias.  Neurological:  Negative for dizziness, weakness and headaches.  Psychiatric/Behavioral:  Positive for depression. The patient is nervous/anxious. The patient does not have insomnia.        States she's stable on medication    Past Medical History:  Diagnosis Date   Anemia     Anxiety    At risk for sudden cardiac death 10-Nov-2012   CHF - Combined Systolic + Diastolic Dysfunction. EF of 15-20% with Grade II diastolic dysfunction on echo 10/27/12 10/28/2012   CKD (chronic kidney disease)    Coronary artery disease    Depression    .   Dyspnea    none per pt on 04/23/22   Fibroids    Heart murmur    History of blood transfusion 2014   Hypertension    Hypothyroidism    S/P cardiac catheterization, 11-10-12, normal coronaries with minimal luminal irregularities in RCA system 10/30/2012   Thyroid disease    hypothyroidism   Past Surgical History:  Procedure Laterality Date   AORTIC VALVE REPLACEMENT N/A 03/12/2017   Procedure: AORTIC VALVE REPLACEMENT (AVR) with Mitral Annuloplasty;  Surgeon: Alleen Borne, MD;  Location: [facility] OR;  Service: Open Heart Surgery;  Laterality: N/A;   EXTERNAL FIXATION LEG Right 04/24/2022   Procedure: EXTERNAL FIXATION RIGHT LEG;  Surgeon: Netta Cedars, MD;  Location: [facility] OR;  Service: Orthopedics;  Laterality: Right;   EXTERNAL FIXATION REMOVAL Right 05/07/2022   Procedure: REMOVAL EXTERNAL FIXATION LEG right;  Surgeon: Netta Cedars, MD;  Location: [facility] OR;  Service: Orthopedics;  Laterality: Right;  120 min   HARDWARE REMOVAL Right 11/26/2022   Procedure: Right ankle removal of hardware (syndesmotic screws x2);  Surgeon: Netta Cedars, MD;  Location: [facility];  Service: Orthopedics;  Laterality: Right;  60   LEFT AND RIGHT HEART CATHETERIZATION WITH CORONARY ANGIOGRAM N/A Nov 10, 2012   Procedure:  LEFT AND RIGHT HEART CATHETERIZATION WITH CORONARY ANGIOGRAM;  Surgeon: Marykay Lex, MD;  Location: [facility] CATH LAB;  Service: Cardiovascular;  Laterality: N/A;   MITRAL VALVE REPAIR N/A 03/12/2017   Procedure: MITRAL VALVE REPAIR (MVR);  Surgeon: Alleen Borne, MD;  Location: [facility] OR;  Service: Open Heart Surgery;  Laterality: N/A;  mitral annuloplasty   MULTIPLE EXTRACTIONS WITH ALVEOLOPLASTY N/A 01/21/2017    Procedure: Extraction of tooth #'s 4-13 with alveoloplasty;  Surgeon: Charlynne Pander, DDS;  Location: [facility] OR;  Service: Oral Surgery;  Laterality: N/A;   ORIF ANKLE FRACTURE Right 05/07/2022   Procedure: OPEN REDUCTION INTERNAL FIXATION (ORIF)right  ANKLE FRACTURE possible syndesmosis and/or deltoid fixation, possible allograft;  Surgeon: Netta Cedars, MD;  Location: [facility] OR;  Service: Orthopedics;  Laterality: Right;  120 min   RIGHT/LEFT HEART CATH AND CORONARY ANGIOGRAPHY N/A 01/09/2017   Procedure: Right/Left Heart Cath and Coronary Angiography;  Surgeon: Lennette Bihari, MD;  Location[facility] INVAS[facility] CV LAB;  Service: Cardiovascular;  Laterality: N/A;   TEE WITHOUT CARDIOVERSION N/A 03/12/2017   Procedure: TRANSESOPHAGEAL ECHOCARDIOGRAM (TEE);  Surgeon: Alleen Borne, MD;  Location: [facility] OR;  Service: Open Heart Surgery;  Laterality: N/A;   Social History:   reports that she quit smoking about 44 years ago. Her smoking use included cigars and cigarettes. She started smoking about 49 years ago. She has a 2.5 pack-year smoking history. She has never used smokeless tobacco. She reports current alcohol use of about 5.0 standard drinks of alcohol per week. She reports that she does not currently use drugs after having used the following drugs: Marijuana. Frequency: 5.00 times per week.  Family History  Problem Relation Age of Onset   Stroke Mother    Hypertension Mother    Liver disease Mother        autoimmune   Heart disease Mother    Colon polyps Mother    Stroke Father    Hypertension Father    Hypertension Maternal Grandmother    Heart disease Maternal Grandmother    Bone cancer Paternal Grandfather    Bone cancer Maternal Uncle    Arthritis Paternal Uncle    Breast cancer Cousin     Medications: Patient's Medications  New Prescriptions   No medications on file  Previous Medications   ACETAMINOPHEN (TYLENOL) 500 MG TABLET    Take 1,000 mg by mouth every 6 (six) hours as needed  for mild pain.   ASPIRIN EC 81 MG EC TABLET    Take 1 tablet (81 mg total) by mouth daily.   BUPROPION (WELLBUTRIN XL) 150 MG 24 HR TABLET    Take 1 tablet (150 mg total) by mouth daily.   CARVEDILOL (COREG) 12.5 MG TABLET    Take 1 tablet by mouth twice daily   CHOLECALCIFEROL (D3) 50 MCG (2000 UT) TABS    Take 2,000 Units by mouth daily.   FERROUS SULFATE 325 (65 FE) MG EC TABLET    Take 1 tablet (325 mg total) by mouth 2 (two) times daily.   FLUTICASONE (FLONASE) 50 MCG/ACT NASAL SPRAY    Place 1 spray into both nostrils 2 (two) times daily as needed for allergies or rhinitis.   FUROSEMIDE (LASIX) 40 MG TABLET    Take 1 tablet by mouth once daily   LEVOTHYROXINE (SYNTHROID) 100 MCG TABLET    TAKE 1 TABLET BY MOUTH ONCE DAILY BEFORE BREAKFAST   LISINOPRIL (ZESTRIL) 2.5 MG TABLET    Take 1 tablet (2.5 mg  total) by mouth daily.   MELATONIN 1 MG TABS TABLET    Take 1 mg by mouth at bedtime.   MIRTAZAPINE (REMERON) 30 MG TABLET    TAKE 1 TABLET BY MOUTH ONCE DAILY AT BEDTIMETAKE 1 TABLET BY MOUTH ONCE DAILY AT BEDTIME   ROSUVASTATIN (CRESTOR) 40 MG TABLET    Take 1 tablet by mouth once daily   SPIRONOLACTONE (ALDACTONE) 25 MG TABLET    Take 0.5 tablets (12.5 mg total) by mouth daily.  Modified Medications   No medications on file  Discontinued Medications   No medications on file    Physical Exam:  Vitals:   04/20/23 1010  BP: 110/74  Pulse: 75  Resp: 14  Temp: 98 F (36.7 C)  SpO2: 97%  Weight: 65.3 kg  Height: 5\' 5"  (1.651 m)   Body mass index is 23.96 kg/m. Wt Readings from Last 3 Encounters:  04/20/23 65.3 kg  02/03/23 63.7 kg  01/16/23 63.3 kg    Physical Exam Constitutional:      Appearance: Normal appearance. She is normal weight.  Cardiovascular:     Rate and Rhythm: Normal rate and regular rhythm.     Pulses: Normal pulses.     Heart sounds: Normal heart sounds.  Pulmonary:     Effort: Pulmonary effort is normal.     Breath sounds: Normal breath sounds.   Abdominal:     General: Bowel sounds are normal.     Palpations: Abdomen is soft.  Musculoskeletal:        General: No swelling. Normal range of motion.  Skin:    General: Skin is warm and dry.  Neurological:     General: No focal deficit present.     Mental Status: She is alert and oriented to person, place, and time. Mental status is at baseline.  Psychiatric:        Behavior: Behavior normal.     Labs reviewed: Basic Metabolic Panel: Recent Labs    09/19/22 1044 11/24/22 1400 02/03/23 0930 02/10/23 0948 02/17/23 0958  NA 141   < > 141 141 142  K 4.2   < > 3.2* 4.2 4.2  CL 108   < > 103 103 105  CO2 22   < > 23 24 23   GLUCOSE 99   < > 107* 85 93  BUN 14   < > 14 15 16   CREATININE 0.92   < > 0.95 0.89 0.88  CALCIUM 9.9   < > 10.1 10.3* 10.3*  TSH 3.35  --   --   --   --    < > = values in this interval not displayed.   Liver Function Tests: Recent Labs    04/21/22 0951 09/19/22 1044  AST 21 19  ALT 8 9  BILITOT 0.5 0.3  PROT 9.0* 8.5*   No results for input(s): "LIPASE", "AMYLASE" in the last 8760 hours. No results for input(s): "AMMONIA" in the last 8760 hours. CBC: Recent Labs    04/21/22 0951 09/19/22 1044  WBC 6.2 4.4  NEUTROABS 3,968 2,266  HGB 12.0 12.4  HCT 37.2 37.9  MCV 86.9 82.8  PLT 186 139*   Lipid Panel: Recent Labs    05/19/22 1039  CHOL 151  HDL 56  LDLCALC 78  TRIG 91  CHOLHDL 2.7   TSH: Recent Labs    09/19/22 1044  TSH 3.35   A1C: Lab Results  Component Value Date   HGBA1C 5.4 06/08/2019  Assessment/Plan 1. Primary hypertension -Encouraged dietary modifications and physical activity -- DASH diet - Lipid panel -Continue current medication regimen -Follow-up with cardiology  2. Cardiomyopathy, nonischemic (HCC) -stable, Follow-up with cardiology -Continue current medication regimen  3. Chronic systolic congestive heart failure (HCC) -No signs of fluid volume overload, no shortness of breath, no lower  extremity edema -Follow-up with cardiology -Continue current medication regimen  4. Moderate episode of recurrent major depressive disorder (HCC) -Stable and improved on wellbutrin. Having sweating but able to tolerate at this time.  -Continue current medication regimen  5. Hypothyroidism, unspecified type -Continue levothyroxine -TSH at goal, 3.35, in February  6. Mixed hyperlipidemia - Lipid panel dietary modifications and physical activity  7. Excessive sweating -Ongoing, some improvement -Medication related, patient wants to stay on current medications  8. Iron deficiency anemia due to chronic blood loss -No signs of blood loss, will follow up lab - CBC with Differential/Platelet  9. Need for influenza vaccination - Flu vaccine trivalent PF, 6mos and older(Flulaval,Afluria,Fluarix,Fluzone)   Return in about 6 months (around 10/18/2023) for routine follow up.  Rollen Sox, FNP-MSN Student -I personally was present during the history, physical exam and medical decision-making activities of this service and have verified that the service and findings are accurately documented in the student's note Abbey Chatters, NP

## 2023-04-21 LAB — CBC WITH DIFFERENTIAL/PLATELET
Absolute Monocytes: 323 {cells}/uL (ref 200–950)
Basophils Absolute: 20 {cells}/uL (ref 0–200)
Basophils Relative: 0.4 %
Eosinophils Absolute: 78 {cells}/uL (ref 15–500)
Eosinophils Relative: 1.6 %
HCT: 37.9 % (ref 35.0–45.0)
Hemoglobin: 11.9 g/dL (ref 11.7–15.5)
Lymphs Abs: 1122 {cells}/uL (ref 850–3900)
MCH: 27.5 pg (ref 27.0–33.0)
MCHC: 31.4 g/dL — ABNORMAL LOW (ref 32.0–36.0)
MCV: 87.7 fL (ref 80.0–100.0)
MPV: 11.7 fL (ref 7.5–12.5)
Monocytes Relative: 6.6 %
Neutro Abs: 3357 {cells}/uL (ref 1500–7800)
Neutrophils Relative %: 68.5 %
Platelets: 163 10*3/uL (ref 140–400)
RBC: 4.32 10*6/uL (ref 3.80–5.10)
RDW: 14.5 % (ref 11.0–15.0)
Total Lymphocyte: 22.9 %
WBC: 4.9 10*3/uL (ref 3.8–10.8)

## 2023-04-21 LAB — LIPID PANEL
Cholesterol: 129 mg/dL (ref <200)
HDL: 44 mg/dL — ABNORMAL LOW (ref >=50)
LDL Cholesterol (Calc): 69 mg/dL
Non-HDL Cholesterol (Calc): 85 mg/dL (ref <130)
Total CHOL/HDL Ratio: 2.9 (calc) (ref <5.0)
Triglycerides: 75 mg/dL (ref <150)

## 2023-05-12 DIAGNOSIS — S82851A Displaced trimalleolar fracture of right lower leg, initial encounter for closed fracture: Secondary | ICD-10-CM | POA: Diagnosis not present

## 2023-05-12 DIAGNOSIS — Z4889 Encounter for other specified surgical aftercare: Secondary | ICD-10-CM | POA: Diagnosis not present

## 2023-05-24 ENCOUNTER — Other Ambulatory Visit: Payer: Self-pay | Admitting: Nurse Practitioner

## 2023-05-24 ENCOUNTER — Other Ambulatory Visit: Payer: Self-pay | Admitting: Family

## 2023-05-24 DIAGNOSIS — F331 Major depressive disorder, recurrent, moderate: Secondary | ICD-10-CM

## 2023-05-24 DIAGNOSIS — E782 Mixed hyperlipidemia: Secondary | ICD-10-CM

## 2023-05-25 ENCOUNTER — Telehealth: Payer: Self-pay | Admitting: Nurse Practitioner

## 2023-05-25 NOTE — Telephone Encounter (Signed)
Patient called requesting a referral to a psychiatrist due to her difficulty sleeping. She said you talked about this at your last office visit on 04/20/23.

## 2023-05-26 ENCOUNTER — Other Ambulatory Visit: Payer: Self-pay | Admitting: Nurse Practitioner

## 2023-05-26 DIAGNOSIS — F331 Major depressive disorder, recurrent, moderate: Secondary | ICD-10-CM

## 2023-05-26 DIAGNOSIS — F5101 Primary insomnia: Secondary | ICD-10-CM

## 2023-05-26 NOTE — Progress Notes (Signed)
Requesting referral to psychiatry

## 2023-06-09 ENCOUNTER — Other Ambulatory Visit: Payer: Self-pay | Admitting: Cardiology

## 2023-06-17 ENCOUNTER — Inpatient Hospital Stay
Admission: RE | Admit: 2023-06-17 | Discharge: 2023-06-17 | Disposition: A | Discharge status: Home / Self Care | Payer: PPO | Source: Ambulatory Visit | Attending: Nurse Practitioner | Admitting: Nurse Practitioner

## 2023-06-17 DIAGNOSIS — E2839 Other primary ovarian failure: Secondary | ICD-10-CM | POA: Diagnosis not present

## 2023-06-17 DIAGNOSIS — N958 Other specified menopausal and perimenopausal disorders: Secondary | ICD-10-CM | POA: Diagnosis not present

## 2023-06-17 DIAGNOSIS — M8588 Other specified disorders of bone density and structure, other site: Secondary | ICD-10-CM | POA: Diagnosis not present

## 2023-06-29 ENCOUNTER — Encounter (HOSPITAL_COMMUNITY): Payer: Self-pay | Admitting: Family

## 2023-06-29 ENCOUNTER — Ambulatory Visit (HOSPITAL_BASED_OUTPATIENT_CLINIC_OR_DEPARTMENT_OTHER): Payer: PPO | Admitting: Family

## 2023-06-29 VITALS — BP 139/91 | HR 80 | Ht 65.0 in | Wt 145.0 lb

## 2023-06-29 DIAGNOSIS — G4700 Insomnia, unspecified: Secondary | ICD-10-CM

## 2023-06-29 DIAGNOSIS — F3341 Major depressive disorder, recurrent, in partial remission: Secondary | ICD-10-CM | POA: Diagnosis not present

## 2023-06-29 MED ORDER — RAMELTEON 8 MG PO TABS: 8.0000 mg | ORAL_TABLET | Freq: Every day | ORAL | 0 refills | Status: DC

## 2023-06-29 NOTE — Progress Notes (Unsigned)
Psychiatric Initial Adult Assessment   Patient Identification: Miranda [name] MRN:  [id number] Date of Evaluation:  06/29/2023 Referral Source: Abbey Chatters,  NP  Chief Complaint: " My primary care did not feel comfortable prescribing trazodone and Remeron together"  Visit Diagnosis: Major depression disorder, generalized anxiety disorder, sleep disturbance  History of Present Illness:  Miranda [name] 57 year old African-American female presents to establish care.  She reports she was referred by her primary care provider.  Reports concerns related to sleep disturbance.  States she has tried multiple medication adjustments in the past without any symptom relief.  Reports primary care provider did not feel comfortable with continue to prescribe trazodone and Remeron together.  Reports she felt that trazodone and Remeron together was helping with her sleep however she continues to have early morning awakenings.   She reports she has been diagnosed with depression and anxiety since her diagnosis with congestive heart failure in 2013.  States at that time she was initiated on Wellbutrin XL 150 mg and trazodone 100 mg -200 mg nightly stated she has been taking Remeron 15 to 30 mg nightly to assist assist with sleep disturbance.  States she has been taking and tolerating medications well.  Did have concerns related to increased perspiration which she attributes to the Wellbutrin. States when she was tried on [place] she had similar symptoms to include abdominal cramping.  States she was then switched back to Wellbutrin which she has been on for the past 5 years.  Miranda [name] reports she is currently disabled, stated employed by [employer].  Reports 1 adult child.  Denied previous inpatient admissions.  Reports a history of auditory hallucinations however states she no longer hears voices.  Reports her mother was diagnosed with a mental health disorder. "  She heard voices all the time but  never treated."   During evaluation Miranda [name] is sitting; she is alert/oriented x 4; calm/cooperative; and mood congruent with affect.  Patient is speaking in a clear tone at moderate volume, and normal pace; with good eye contact. Her thought process is coherent and relevant; There is no indication that she is currently responding to internal/external stimuli or experiencing delusional thought content.  Patient denies suicidal/self-harm/homicidal ideation, psychosis, and paranoia.  Patient has remaine  Associated Signs/Symptoms: Depression Symptoms:  depressed mood, anxiety, (Hypo) Manic Symptoms:  Distractibility, Impulsivity, Anxiety Symptoms:  Excessive Worry, Psychotic Symptoms:  Hallucinations: None PTSD Symptoms: NA  Past Psychiatric History:   Previous Psychotropic Medications: Yes   Substance Abuse History in the last 12 months:  No.  Consequences of Substance Abuse: NA  Past Medical History:  Past Medical History:  Diagnosis Date   Anemia    Anxiety    At risk for sudden cardiac death 10-30-12   CHF - Combined Systolic + Diastolic Dysfunction. EF of 15-20% with Grade II diastolic dysfunction on echo 10/27/12 10/28/2012   CKD (chronic kidney disease)    Coronary artery disease    Depression    .   Dyspnea    none per pt on 04/23/22   Fibroids    Heart murmur    History of blood transfusion 2014   Hypertension    Hypothyroidism    S/P cardiac catheterization, 10-30-2012, normal coronaries with minimal luminal irregularities in RCA system 10/30/2012   Thyroid disease    hypothyroidism    Past Surgical History:  Procedure Laterality Date   AORTIC VALVE REPLACEMENT N/A 03/12/2017   Procedure: AORTIC VALVE REPLACEMENT (AVR) with Mitral Annuloplasty;  Surgeon:  Alleen Borne, MD;  Location: [facility] OR;  Service: Open Heart Surgery;  Laterality: N/A;   EXTERNAL FIXATION LEG Right 04/24/2022   Procedure: EXTERNAL FIXATION RIGHT LEG;  Surgeon: Netta Cedars, MD;   Location: [facility] OR;  Service: Orthopedics;  Laterality: Right;   EXTERNAL FIXATION REMOVAL Right 05/07/2022   Procedure: REMOVAL EXTERNAL FIXATION LEG right;  Surgeon: Netta Cedars, MD;  Location: [facility] OR;  Service: Orthopedics;  Laterality: Right;  120 min   HARDWARE REMOVAL Right 11/26/2022   Procedure: Right ankle removal of hardware (syndesmotic screws x2);  Surgeon: Netta Cedars, MD;  Location: [facility];  Service: Orthopedics;  Laterality: Right;  60   LEFT AND RIGHT HEART CATHETERIZATION WITH CORONARY ANGIOGRAM N/A 10/29/2012   Procedure: LEFT AND RIGHT HEART CATHETERIZATION WITH CORONARY ANGIOGRAM;  Surgeon: Marykay Lex, MD;  Location: [facility] CATH LAB;  Service: Cardiovascular;  Laterality: N/A;   MITRAL VALVE REPAIR N/A 03/12/2017   Procedure: MITRAL VALVE REPAIR (MVR);  Surgeon: Alleen Borne, MD;  Location: [facility] OR;  Service: Open Heart Surgery;  Laterality: N/A;  mitral annuloplasty   MULTIPLE EXTRACTIONS WITH ALVEOLOPLASTY N/A 01/21/2017   Procedure: Extraction of tooth #'s 4-13 with alveoloplasty;  Surgeon: Charlynne Pander, DDS;  Location: [facility] OR;  Service: Oral Surgery;  Laterality: N/A;   ORIF ANKLE FRACTURE Right 05/07/2022   Procedure: OPEN REDUCTION INTERNAL FIXATION (ORIF)right  ANKLE FRACTURE possible syndesmosis and/or deltoid fixation, possible allograft;  Surgeon: Netta Cedars, MD;  Location: [facility] OR;  Service: Orthopedics;  Laterality: Right;  120 min   RIGHT/LEFT HEART CATH AND CORONARY ANGIOGRAPHY N/A 01/09/2017   Procedure: Right/Left Heart Cath and Coronary Angiography;  Surgeon: Lennette Bihari, MD;  Location: [facility] INVASIVE CV LAB;  Service: Cardiovascular;  Laterality: N/A;   TEE WITHOUT CARDIOVERSION N/A 03/12/2017   Procedure: TRANSESOPHAGEAL ECHOCARDIOGRAM (TEE);  Surgeon: Alleen Borne, MD;  Location: [facility] OR;  Service: Open Heart Surgery;  Laterality: N/A;    Family Psychiatric History:   Family History:  Family History  Problem Relation Age  of Onset   Stroke Mother    Hypertension Mother    Liver disease Mother        autoimmune   Heart disease Mother    Colon polyps Mother    Stroke Father    Hypertension Father    Hypertension Maternal Grandmother    Heart disease Maternal Grandmother    Bone cancer Paternal Grandfather    Bone cancer Maternal Uncle    Arthritis Paternal Uncle    Breast cancer Cousin     Social History:   Social History   Socioeconomic History   Marital status: Single    Spouse name: Not on file   Number of children: 1   Years of education: 12th   Highest education level: Not on file  Occupational History   Occupation: unemployed    Employer: [employer]  Tobacco Use   Smoking status: Former    Current packs/day: 0.00    Average packs/day: 0.5 packs/day for 5.0 years (2.5 ttl pk-yrs)    Types: Cigars, Cigarettes    Start date: 08/04/1973    Quit date: 08/04/1978    Years since quitting: 44.9   Smokeless tobacco: Never   Tobacco comments:    1 cigar daily-quit 10/24/12    Vaping Use   Vaping status: Never Used  Substance and Sexual Activity   Alcohol use: Not Currently    Comment: 2 drinks a week   Drug  use: Not Currently    Frequency: 5.0 times per week    Types: Marijuana    Comment: none since 2015   Sexual activity: Not Currently    Birth control/protection: Post-menopausal  Other Topics Concern   Not on file  Social History Narrative   Patient lives at home alone. (Condo)   Caffeine use: 1/2 soda daily   Diet   Martial status:single   Is it one or more stories? Yes   How many persons live in your home? 1   Do you have any pets in your home? No   Current or past profession: Company secretary   Do you exercise? No   Do you have a living will? No    Do you have a DNR form? No    Do you have signed POA/HPOA forms? No    Social Determinants of Health   Financial Resource Strain: Low Risk  (11/06/2017)   Overall Financial Resource Strain (CARDIA)    Difficulty of  Paying Living Expenses: Not hard at all  Food Insecurity: No Food Insecurity (11/22/2019)   Hunger Vital Sign    Worried About Running Out of Food in the Last Year: Never true    Ran Out of Food in the Last Year: Never true  Transportation Needs: No Transportation Needs (11/22/2019)   PRAPARE - Administrator, Civil Service (Medical): No    Lack of Transportation (Non-Medical): No  Physical Activity: Insufficiently Active (11/06/2017)   Exercise Vital Sign    Days of Exercise per Week: 5 days    Minutes of Exercise per Session: 20 min  Stress: Stress Concern Present (11/06/2017)   Miranda [name] [employer] Occupational Health - Occupational Stress Questionnaire    Feeling of Stress : To some extent  Social Connections: Moderately Isolated (11/06/2017)   Social Connection and Isolation Panel [NHANES]    Frequency of Communication with Friends and Family: More than three times a week    Frequency of Social Gatherings with Friends and Family: More than three times a week    Attends Religious Services: Never    Database administrator or Organizations: No    Attends Banker Meetings: Never    Marital Status: Never married    Additional Social History:   Allergies:   Allergies  Allergen Reactions   Codeine Hives   Rexulti [Brexpiprazole]     Stomach cramps   Wellbutrin [Bupropion]     Excessive sweating     Metabolic Disorder Labs: Lab Results  Component Value Date   HGBA1C 5.4 06/08/2019   MPG 108 06/08/2019   MPG 128 03/10/2017   No results found for: "PROLACTIN" Lab Results  Component Value Date   CHOL 129 04/20/2023   TRIG 75 04/20/2023   HDL 44 (L) 04/20/2023   CHOLHDL 2.9 04/20/2023   VLDL 11 09/26/2016   LDLCALC 69 04/20/2023   LDLCALC 78 05/19/2022   Lab Results  Component Value Date   TSH 3.35 09/19/2022    Therapeutic Level Labs: No results found for: "LITHIUM" No results found for: "CBMZ" No results found for: "VALPROATE"  Current  Medications: Current Outpatient Medications  Medication Sig Dispense Refill   acetaminophen (TYLENOL) 500 MG tablet Take 1,000 mg by mouth every 6 (six) hours as needed for mild pain.     aspirin EC 81 MG EC tablet Take 1 tablet (81 mg total) by mouth daily.     buPROPion (WELLBUTRIN XL) 150 MG 24 hr tablet Take  1 tablet (150 mg total) by mouth daily. 30 tablet 1   carvedilol (COREG) 12.5 MG tablet Take 1 tablet by mouth twice daily 180 tablet 3   Cholecalciferol (D3) 50 MCG (2000 UT) TABS Take 2,000 Units by mouth daily.     ferrous sulfate 325 (65 FE) MG EC tablet Take 1 tablet (325 mg total) by mouth 2 (two) times daily. 180 tablet 3   fluticasone (FLONASE) 50 MCG/ACT nasal spray Place 1 spray into both nostrils 2 (two) times daily as needed for allergies or rhinitis. 16 g 5   furosemide (LASIX) 40 MG tablet Take 1 tablet by mouth once daily 90 tablet 3   levothyroxine (SYNTHROID) 100 MCG tablet TAKE 1 TABLET BY MOUTH ONCE DAILY BEFORE BREAKFAST 90 tablet 0   lisinopril (ZESTRIL) 2.5 MG tablet Take 1 tablet (2.5 mg total) by mouth daily. 90 tablet 3   ramelteon (ROZEREM) 8 MG tablet Take 1 tablet (8 mg total) by mouth at bedtime. 30 tablet 0   rosuvastatin (CRESTOR) 40 MG tablet Take 1 tablet by mouth once daily 90 tablet 0   spironolactone (ALDACTONE) 25 MG tablet Take 1/2 (one-half) tablet by mouth once daily 15 tablet 0   No current facility-administered medications for this visit.    Musculoskeletal: Strength & Muscle Tone: within normal limits Gait & Station: normal Patient leans: N/A  Psychiatric Specialty Exam: Review of Systems  Blood pressure (!) 139/91, pulse 80, height 5\' 5"  (1.651 m), weight 145 lb (65.8 kg), last menstrual period 01/17/2015.Body mass index is 24.13 kg/m.  General Appearance: Casual  Eye Contact:  Good  Speech:  Clear and Coherent  Volume:  Normal  Mood:  Anxious and Depressed  Affect:  Depressed  Thought Process:  Coherent  Orientation:  Full  (Time, Place, and Person)  Thought Content:  Logical  Suicidal Thoughts:  No  Homicidal Thoughts:  No  Memory:  Immediate;   Fair Recent;   Fair  Judgement:  Good  Insight:  Present  Psychomotor Activity:  Normal  Concentration:  Concentration: Good  Recall:  Good  Fund of Knowledge:Good  Language: Good  Akathisia:  No  Handed:  Right  AIMS (if indicated):  not done  Assets:  Communication Skills Desire for Improvement Resilience Social Support  ADL's:  Intact  Cognition: WNL  Sleep:  Poor   Screenings: AUDIT    Flowsheet Row Office Visit from 09/27/2018 in [facility] & Adult Medicine Office Visit from 05/24/2018 in [facility] & Adult Medicine  Alcohol Use Disorder Identification Test Final Score (AUDIT) 4 3      Mini-Mental    Flowsheet Row Office Visit from 11/28/2022 in [facility] Adult Medicine  Total Score (max 30 points ) 30      PHQ2-9    Flowsheet Row Office Visit from 06/29/2023 in [facility] Office Visit from 04/20/2023 in [facility] Adult [facility] Office Visit from 11/28/2022 in [facility] Clinical Support from 11/21/2021 in [facility] Office Visit from 10/25/2021 in [facility] & Adult Medicine  PHQ-2 Total Score 3 0 0 3 0  PHQ-9 Total Score 6 -- -- 8 --      Flowsheet Row Admission (Discharged) from 05/07/2022 in [place] [facility] Admission (Discharged) from 04/24/2022 in [place] [facility]  C-SSRS RISK CATEGORY  No Risk No Risk       Assessment and Plan: Miranda [name] 57 year old African-American female presents to establish care.  States she was referred by her primary care however due to uncontrolled sleep disturbance.  She reports she has a difficult time staying asleep.  States she has been taking  Remeron and trazodone together for the past 6 months which has been helpful however states her primary care did not feel comfortable represcribing medications together.  She denied any medication symptoms related to taking medications together i.e. headaches, nausea, vomiting, dizziness and/or night terrors etc.  Discussed downward titration to Remeron 7.5 due to the sedating properties with medications at lower doses.  Ultimately decided to discontinue Remeron 30 mg will initiate Rozerem 8 mg.  Follow-up 1 week for medication adherence/tolerability.  Patient to continue Wellbutrin 150 mg daily. -Consideration for sleep study  Collaboration of Care: Medication Management AEB initiated Rozerem 8 mg nightly -Doxylamine Succinate and or Doxepin  for consideration at 1 week follow-up  Patient/Guardian was advised Release of Information must be obtained prior to any record release in order to collaborate their care with an outside provider. Patient/Guardian was advised if they have not already done so to contact the registration department to sign all necessary forms in order for [place] to release information regarding their care.   Consent: Patient/Guardian gives verbal consent for treatment and assignment of benefits for services provided during this visit. Patient/Guardian expressed understanding and agreed to proceed.   Miranda Rack, NP 11/25/20242:43 PM

## 2023-07-03 ENCOUNTER — Telehealth (HOSPITAL_COMMUNITY): Payer: PPO | Admitting: Family

## 2023-07-03 DIAGNOSIS — G479 Sleep disorder, unspecified: Secondary | ICD-10-CM

## 2023-07-03 MED ORDER — QUETIAPINE FUMARATE 25 MG PO TABS: 25.0000 mg | ORAL_TABLET | Freq: Every evening | ORAL | 0 refills | Status: DC | PRN

## 2023-07-03 NOTE — Progress Notes (Addendum)
Virtual Visit via Video Note  I connected with Miranda [name] on 07/03/23 at 10:00 AM EST by a video enabled telemedicine application and verified that I am speaking with the correct person using two identifiers.  Location: Patient: Home  Provider: Home Office    I discussed the limitations of evaluation and management by telemedicine and the availability of in person appointments. The patient expressed understanding and agreed to proceed.    I discussed the assessment and treatment plan with the patient. The patient was provided an opportunity to ask questions and all were answered. The patient agreed with the plan and demonstrated an understanding of the instructions.   The patient was advised to call back or seek an in-person evaluation if the symptoms worsen or if the condition fails to improve as anticipated.  I provided 00 minutes of non-face-to-face time during this encounter.   Miranda Rack, NP   [facility] MD/PA/NP OP Progress Note  07/03/2023 10:17 AM Miranda [name]  MRN:  [id number]  Chief Complaint: I have been up since 3 AM  HPI: Miranda [name] 57 year old African-American female presents for medication management appointment.  Per initial visit patient was initiated on Rozerem 8 mg nightly which she reports she has been taking and tolerating well however states that she continues to have early morning awakening. "  I am not able to stay asleep I been up since 3 this morning." Patient was recently referred by her primary care provider for insomnia reported issues.  Patient was previously prescribed Remeron mirtazapine 7.5 mg to 15 mg nightly in addition to trazodone 150 mg nightly.  Denied that she has been followed by sleep study.  Discussed discontinuing Rozerem.  Will initiate trazodone 25 mg to 50 mg nightly this provider will follow-up with patient 07/06/2023  Sleep disturbance: Discontinue Rozerem 8 mg nightly Patient to start Seroquel 25 to 50 mg nightly -Primary  care provider was prescribed trazodone 150 mg nightly and mirtazapine 7.5 mg nightly -Consideration for following up with sleep clinic  During evaluation Miranda [name] pleasant however, presents direct matter-of-fact affect.  She is alert and oriented x 3.  She is calm and cooperative throughout this assessment.  States she has been up since early morning for the past 2 nights.  Presents with good eye contact, normal pace normal volume.  Denying any hyper or hypomanic symptoms.  Continues to endorse struggling with inability to sleep throughout the night.  Reports receiving 4 to 5 hours nightly.  She does not appear to be responding to internal stimuli or experiencing any delusional thought content.  Patient answers all questions appropriately throughout this assessment.  Will initiate Seroquel 25 to 50 mg nightly.  Will follow-up in 2 days for medication tolerability/adherence.  Visit Diagnosis:    ICD-10-CM   1. Sleep disturbance  G47.9       Past Psychiatric History:   Past Medical History:  Past Medical History:  Diagnosis Date   Anemia    Anxiety    At risk for sudden cardiac death 20-Nov-2012   CHF - Combined Systolic + Diastolic Dysfunction. EF of 15-20% with Grade II diastolic dysfunction on echo 10/27/12 10/28/2012   CKD (chronic kidney disease)    Coronary artery disease    Depression    .   Dyspnea    none per pt on 04/23/22   Fibroids    Heart murmur    History of blood transfusion 2014   Hypertension    Hypothyroidism    S/P cardiac  catheterization, 10/29/12, normal coronaries with minimal luminal irregularities in RCA system 10/30/2012   Thyroid disease    hypothyroidism    Past Surgical History:  Procedure Laterality Date   AORTIC VALVE REPLACEMENT N/A 03/12/2017   Procedure: AORTIC VALVE REPLACEMENT (AVR) with Mitral Annuloplasty;  Surgeon: Alleen Borne, MD;  Location: [facility] OR;  Service: Open Heart Surgery;  Laterality: N/A;   EXTERNAL FIXATION LEG Right 04/24/2022    Procedure: EXTERNAL FIXATION RIGHT LEG;  Surgeon: Netta Cedars, MD;  Location: [facility] OR;  Service: Orthopedics;  Laterality: Right;   EXTERNAL FIXATION REMOVAL Right 05/07/2022   Procedure: REMOVAL EXTERNAL FIXATION LEG right;  Surgeon: Netta Cedars, MD;  Location: [facility] OR;  Service: Orthopedics;  Laterality: Right;  120 min   HARDWARE REMOVAL Right 11/26/2022   Procedure: Right ankle removal of hardware (syndesmotic screws x2);  Surgeon: Netta Cedars, MD;  Location: [facility];  Service: Orthopedics;  Laterality: Right;  60   LEFT AND RIGHT HEART CATHETERIZATION WITH CORONARY ANGIOGRAM N/A 10/29/2012   Procedure: LEFT AND RIGHT HEART CATHETERIZATION WITH CORONARY ANGIOGRAM;  Surgeon: Marykay Lex, MD;  Location: [facility] CATH LAB;  Service: Cardiovascular;  Laterality: N/A;   MITRAL VALVE REPAIR N/A 03/12/2017   Procedure: MITRAL VALVE REPAIR (MVR);  Surgeon: Alleen Borne, MD;  Location: [facility] OR;  Service: Open Heart Surgery;  Laterality: N/A;  mitral annuloplasty   MULTIPLE EXTRACTIONS WITH ALVEOLOPLASTY N/A 01/21/2017   Procedure: Extraction of tooth #'s 4-13 with alveoloplasty;  Surgeon: Charlynne Pander, DDS;  Location: [facility] OR;  Service: Oral Surgery;  Laterality: N/A;   ORIF ANKLE FRACTURE Right 05/07/2022   Procedure: OPEN REDUCTION INTERNAL FIXATION (ORIF)right  ANKLE FRACTURE possible syndesmosis and/or deltoid fixation, possible allograft;  Surgeon: Netta Cedars, MD;  Location: [facility] OR;  Service: Orthopedics;  Laterality: Right;  120 min   RIGHT/LEFT HEART CATH AND CORONARY ANGIOGRAPHY N/A 01/09/2017   Procedure: Right/Left Heart Cath and Coronary Angiography;  Surgeon: Lennette Bihari, MD;  Location: [facility] INVASIVE CV LAB;  Service: Cardiovascular;  Laterality: N/A;   TEE WITHOUT CARDIOVERSION N/A 03/12/2017   Procedure: TRANSESOPHAGEAL ECHOCARDIOGRAM (TEE);  Surgeon: Alleen Borne, MD;  Location: [facility] OR;  Service: Open Heart Surgery;  Laterality: N/A;    Family  Psychiatric History:   Family History:  Family History  Problem Relation Age of Onset   Stroke Mother    Hypertension Mother    Liver disease Mother        autoimmune   Heart disease Mother    Colon polyps Mother    Stroke Father    Hypertension Father    Hypertension Maternal Grandmother    Heart disease Maternal Grandmother    Bone cancer Paternal Grandfather    Bone cancer Maternal Uncle    Arthritis Paternal Uncle    Breast cancer Cousin     Social History:  Social History   Socioeconomic History   Marital status: Single    Spouse name: Not on file   Number of children: 1   Years of education: 12th   Highest education level: Not on file  Occupational History   Occupation: unemployed    Employer: [employer]  Tobacco Use   Smoking status: Former    Current packs/day: 0.00    Average packs/day: 0.5 packs/day for 5.0 years (2.5 ttl pk-yrs)    Types: Cigars, Cigarettes    Start date: 08/04/1973    Quit date: 08/04/1978    Years since quitting: 44.9  Smokeless tobacco: Never   Tobacco comments:    1 cigar daily-quit 10/24/12    Vaping Use   Vaping status: Never Used  Substance and Sexual Activity   Alcohol use: Not Currently    Comment: 2 drinks a week   Drug use: Not Currently    Frequency: 5.0 times per week    Types: Marijuana    Comment: none since 2015   Sexual activity: Not Currently    Birth control/protection: Post-menopausal  Other Topics Concern   Not on file  Social History Narrative   Patient lives at home alone. (Condo)   Caffeine use: 1/2 soda daily   Diet   Martial status:single   Is it one or more stories? Yes   How many persons live in your home? 1   Do you have any pets in your home? No   Current or past profession: Company secretary   Do you exercise? No   Do you have a living will? No    Do you have a DNR form? No    Do you have signed POA/HPOA forms? No    Social Determinants of Health   Financial Resource Strain: Low Risk   (11/06/2017)   Overall Financial Resource Strain (CARDIA)    Difficulty of Paying Living Expenses: Not hard at all  Food Insecurity: No Food Insecurity (11/22/2019)   Hunger Vital Sign    Worried About Running Out of Food in the Last Year: Never true    Ran Out of Food in the Last Year: Never true  Transportation Needs: No Transportation Needs (11/22/2019)   PRAPARE - Administrator, Civil Service (Medical): No    Lack of Transportation (Non-Medical): No  Physical Activity: Insufficiently Active (11/06/2017)   Exercise Vital Sign    Days of Exercise per Week: 5 days    Minutes of Exercise per Session: 20 min  Stress: Stress Concern Present (11/06/2017)   [employer] of Occupational Health - Occupational Stress Questionnaire    Feeling of Stress : To some extent  Social Connections: Moderately Isolated (11/06/2017)   Social Connection and Isolation Panel [NHANES]    Frequency of Communication with Friends and Family: More than three times a week    Frequency of Social Gatherings with Friends and Family: More than three times a week    Attends Religious Services: Never    Database administrator or Organizations: No    Attends Banker Meetings: Never    Marital Status: Never married    Allergies:  Allergies  Allergen Reactions   Codeine Hives   Rexulti [Brexpiprazole]     Stomach cramps   Wellbutrin [Bupropion]     Excessive sweating     Metabolic Disorder Labs: Lab Results  Component Value Date   HGBA1C 5.4 06/08/2019   MPG 108 06/08/2019   MPG 128 03/10/2017   No results found for: "PROLACTIN" Lab Results  Component Value Date   CHOL 129 04/20/2023   TRIG 75 04/20/2023   HDL 44 (L) 04/20/2023   CHOLHDL 2.9 04/20/2023   VLDL 11 09/26/2016   LDLCALC 69 04/20/2023   LDLCALC 78 05/19/2022   Lab Results  Component Value Date   TSH 3.35 09/19/2022   TSH 4.49 10/25/2021    Therapeutic Level Labs: No results found for: "LITHIUM" No results  found for: "VALPROATE" No results found for: "CBMZ"  Current Medications: Current Outpatient Medications  Medication Sig Dispense Refill   QUEtiapine (SEROQUEL) 25 MG tablet  Take 1 tablet (25 mg total) by mouth at bedtime as needed and may repeat dose one time if needed. 60 tablet 0   acetaminophen (TYLENOL) 500 MG tablet Take 1,000 mg by mouth every 6 (six) hours as needed for mild pain.     aspirin EC 81 MG EC tablet Take 1 tablet (81 mg total) by mouth daily.     buPROPion (WELLBUTRIN XL) 150 MG 24 hr tablet Take 1 tablet (150 mg total) by mouth daily. 30 tablet 1   carvedilol (COREG) 12.5 MG tablet Take 1 tablet by mouth twice daily 180 tablet 3   Cholecalciferol (D3) 50 MCG (2000 UT) TABS Take 2,000 Units by mouth daily.     ferrous sulfate 325 (65 FE) MG EC tablet Take 1 tablet (325 mg total) by mouth 2 (two) times daily. 180 tablet 3   fluticasone (FLONASE) 50 MCG/ACT nasal spray Place 1 spray into both nostrils 2 (two) times daily as needed for allergies or rhinitis. 16 g 5   furosemide (LASIX) 40 MG tablet Take 1 tablet by mouth once daily 90 tablet 3   levothyroxine (SYNTHROID) 100 MCG tablet TAKE 1 TABLET BY MOUTH ONCE DAILY BEFORE BREAKFAST 90 tablet 0   lisinopril (ZESTRIL) 2.5 MG tablet Take 1 tablet (2.5 mg total) by mouth daily. 90 tablet 3   ramelteon (ROZEREM) 8 MG tablet Take 1 tablet (8 mg total) by mouth at bedtime. 30 tablet 0   rosuvastatin (CRESTOR) 40 MG tablet Take 1 tablet by mouth once daily 90 tablet 0   spironolactone (ALDACTONE) 25 MG tablet Take 1/2 (one-half) tablet by mouth once daily 15 tablet 0   No current facility-administered medications for this visit.     Musculoskeletal:   Psychiatric Specialty Exam: Review of Systems  Psychiatric/Behavioral:  Positive for decreased concentration and sleep disturbance. Negative for dysphoric mood, hallucinations and suicidal ideas.   All other systems reviewed and are negative.   Last menstrual period  01/17/2015.There is no height or weight on file to calculate BMI.  General Appearance: Casual  Eye Contact:  Good  Speech:  Clear and Coherent  Volume:  Normal  Mood:  Anxious  Affect:  Flat  Thought Process:  Coherent  Orientation:  Full (Time, Place, and Person)  Thought Content: Logical   Suicidal Thoughts:  No  Homicidal Thoughts:  No  Memory:  Immediate;   Good Recent;   Good  Judgement:  Good  Insight:  Good  Psychomotor Activity:  Normal  Concentration:  Concentration: Good  Recall:  Good  Fund of Knowledge: Good  Language: Good  Akathisia:  No  Handed:  Right  AIMS (if indicated): not done  Assets:  Communication Skills Desire for Improvement  ADL's:  Intact  Cognition: WNL  Sleep:  Good   Screenings: AUDIT    Flowsheet Row Office Visit from 09/27/2018 in [facility] & Adult Medicine Office Visit from 05/24/2018 in [facility] & Adult Medicine  Alcohol Use Disorder Identification Test Final Score (AUDIT) 4 3      Mini-Mental    Flowsheet Row Office Visit from 11/28/2022 in [facility] & Adult Medicine  Total Score (max 30 points ) 30      PHQ2-9    Flowsheet Row Office Visit from 06/29/2023 in [facility] Office Visit from 04/20/2023 in [facility] & Adult Medicine Office Visit from 11/28/2022 in [facility]  Adult Medicine Clinical Support from 11/21/2021 in [facility] & Adult Medicine Office Visit from 10/25/2021 in [facility] & Adult Medicine  PHQ-2 Total Score 3 0 0 3 0  PHQ-9 Total Score 6 -- -- 8 --      Flowsheet Row Admission (Discharged) from 05/07/2022 in [place] [facility] Admission (Discharged) from 04/24/2022 in [place] [facility]IOPER[facility]  C-SSRS RISK CATEGORY No Risk No Risk       Assessment and Plan: Miranda [name] 57 year old Caucasian female  presents for medication management appointment.  Patient was recently tried on Rozerem 8 mg nightly due to reported insomnia/sleep disturbance issues.  She reports she has been taking this medication for the past 3 nights without any symptom relief.  States early morning awakening.  Patient was previously prescribed mirtazapine 15 mg and trazodone 150 mg however, due to the concerns related to serotonin syndrome she was referred by her primary care provider. Discussed initiating Seroquel 25 to 50 mg nightly.  This provider will follow-up on 07/06/2023 for medication management/tolerability.   Sleep disturbance: Discontinue Rozerem 8 mg nightly Patient to start Seroquel 25 to 50 mg nightly -Primary care provider was prescribed trazodone 150 mg nightly and mirtazapine 7.5 mg nightly -Consideration for following up with sleep clinic  Collaboration of Care: Collaboration of Care: Medication Management AEB patient to start Seroquel 25 to 50 mg nightly  Patient/Guardian was advised Release of Information must be obtained prior to any record release in order to collaborate their care with an outside provider. Patient/Guardian was advised if they have not already done so to contact the registration department to sign all necessary forms in order for [place] to release information regarding their care.   Consent: Patient/Guardian gives verbal consent for treatment and assignment of benefits for services provided during this visit. Patient/Guardian expressed understanding and agreed to proceed.    Miranda Rack, NP 07/03/2023, 10:17 AM

## 2023-07-06 ENCOUNTER — Telehealth (HOSPITAL_COMMUNITY): Payer: Self-pay | Admitting: Family

## 2023-07-06 NOTE — Telephone Encounter (Cosign Needed)
Virtual Visit via Telephone Note  I connected with Morning Sharps on 07/06/2023 at  by telephone and verified that I am speaking with the correct person using two identifiers.  Location: Patient: Home Provider: Office   I discussed the limitations, risks, security and privacy concerns of performing an evaluation and management service by telephone and the availability of in person appointments. I also discussed with the patient that there may be a patient responsible charge related to this service. The patient expressed understanding and agreed to proceed.   I discussed the assessment and treatment plan with the patient. The patient was provided an opportunity to ask questions and all were answered. The patient agreed with the plan and demonstrated an understanding of the instructions.   The patient was advised to call back or seek an in-person evaluation if the symptoms worsen or if the condition fails to improve as anticipated.  I provided 10 minutes of non-face-to-face time during this encounter.   Miranda Rack, NP   This provider spoke to patient telephonically regarding recent adjustments in medications. Patient was initiated on Seroquel 25 to 50 mg nightly for reported insomnia/sleep disturbance issues.  She reports she is getting better rest with taking 50 mg nightly.  She denied any other concerns for medication tolerability.  Patient to follow-up 2 months for medication management appointment.  Support, encouragement and reassurance was provided.

## 2023-07-08 ENCOUNTER — Telehealth (HOSPITAL_COMMUNITY): Payer: Self-pay | Admitting: Family

## 2023-07-08 NOTE — Telephone Encounter (Cosign Needed)
Miranda [name] contacted the office regarding insomnia reported issues.  States she has been taking Seroquel 50 mg nightly states she received " good sleep" on  Saturday and Sunday however, states she didn't get any rest on Monday or Tuesday night.    Discussed titrating Seroquel 50 mg to 75 to 100 mg nightly.  Patient was receptive to plan.

## 2023-07-13 ENCOUNTER — Telehealth (HOSPITAL_COMMUNITY): Payer: Self-pay

## 2023-07-13 NOTE — Telephone Encounter (Signed)
Patient need to give more time Seroquel 100 mg at bedtime.

## 2023-07-13 NOTE — Telephone Encounter (Signed)
This is  patient of Hillery Jacks - Patient called and said after speaking to [place] on Wednesday she increased her Seroquel to 3 (75) mg at bedtime, that did not work so for the next four nights she took 4 tabs (100) mg of the Seroquel. Patient says that it worked for the first three nights but did not work last night. Please review and advise, thank you

## 2023-07-15 NOTE — Telephone Encounter (Signed)
I called patient and left a voicemail advising her to continue the Seroquel for now and give it some more time. I left my call back number for patient if she had any further questions

## 2023-07-20 ENCOUNTER — Telehealth (HOSPITAL_COMMUNITY): Payer: Self-pay

## 2023-07-20 NOTE — Telephone Encounter (Signed)
Patient would like a refill on her Seroquel, she has been taking 4 at night and says they work about 50/50. Patient follows up on 1/28 - she still would like a call..Thank you

## 2023-07-21 ENCOUNTER — Other Ambulatory Visit (HOSPITAL_COMMUNITY): Payer: Self-pay | Admitting: Family

## 2023-07-21 MED ORDER — QUETIAPINE FUMARATE 100 MG PO TABS: 100.0000 mg | ORAL_TABLET | Freq: Every evening | ORAL | 0 refills | Status: DC | PRN

## 2023-07-21 NOTE — Progress Notes (Signed)
Seroquel 100 mg  times 60 tables was refilled after speaking to the patient on 12/17. Patient to keep f/u appointment.  No other concerns noted.

## 2023-07-27 ENCOUNTER — Other Ambulatory Visit: Payer: Self-pay | Admitting: Cardiology

## 2023-08-25 ENCOUNTER — Other Ambulatory Visit: Payer: Self-pay | Admitting: Nurse Practitioner

## 2023-08-25 DIAGNOSIS — E782 Mixed hyperlipidemia: Secondary | ICD-10-CM

## 2023-08-25 DIAGNOSIS — F331 Major depressive disorder, recurrent, moderate: Secondary | ICD-10-CM

## 2023-08-25 NOTE — Telephone Encounter (Signed)
High Risk Warning Populated when attempting to refill, I will send to Provider for further review 

## 2023-09-01 ENCOUNTER — Telehealth (HOSPITAL_COMMUNITY): Payer: PPO | Admitting: Family

## 2023-09-01 DIAGNOSIS — F3341 Major depressive disorder, recurrent, in partial remission: Secondary | ICD-10-CM

## 2023-09-01 DIAGNOSIS — G4700 Insomnia, unspecified: Secondary | ICD-10-CM | POA: Diagnosis not present

## 2023-09-01 MED ORDER — QUETIAPINE FUMARATE 100 MG PO TABS: 100.0000 mg | ORAL_TABLET | Freq: Every evening | ORAL | 2 refills | Status: DC | PRN

## 2023-09-01 NOTE — Progress Notes (Signed)
Virtual Visit via Video Note  I connected with Miranda [name] on 09/01/23 at  3:00 PM EST by a video enabled telemedicine application and verified that I am speaking with the correct person using two identifiers.  Location: Patient: Home Provider: Office   I discussed the limitations of evaluation and management by telemedicine and the availability of in person appointments. The patient expressed understanding and agreed to proceed.   I discussed the assessment and treatment plan with the patient. The patient was provided an opportunity to ask questions and all were answered. The patient agreed with the plan and demonstrated an understanding of the instructions.   The patient was advised to call back or seek an in-person evaluation if the symptoms worsen or if the condition fails to improve as anticipated.  I provided 15 minutes of non-face-to-face time during this encounter.   Oneta Rack, NP   [facility] MD/PA/NP OP Progress Note  09/01/2023 2:57 PM [name]  MRN:  [id number]  Chief Complaint: Bleu reported " I am sleeping and everything is good."  HPI: Miranda [name] 58 year old African-American female presents for medication management follow-up appointment.  She was initiated on Seroquel for mood stabilization/sleep disturbance.  She reports she has been taking and tolerating medications well.  She is denying any medication side effects.  Reports sleep has improved since starting medications.  No concerns related to suicidal or homicidal ideations.  Patient presents with a bright and pleasant affect.  More reactive throughout this assessment than on previous assessments.  She reports attending the [facility] every morning, reports she is disabled and is currently not employed.  No concerns related to appetite.  Discussed following up 4 to 6 months for medication management.  Will make refills available.  Support, encouragement and reassurance was provided.  Major depressive  disorder Sleep disturbance  Continue Wellbutrin 150 mg p.o. daily as prescribed by primary care provider -Continue Seroquel 100 mg at night as needed -Follow-up 4 months for medication management appointment  [name] is sitting; she is alert/oriented x 3; calm/cooperative; and mood congruent with affect.  Patient is speaking in a clear tone at moderate volume, and normal pace; with good eye contact.  Her thought process is coherent and relevant; There is no indication that she is currently responding to internal/external stimuli or experiencing delusional thought content.  Patient denies suicidal/self-harm/homicidal ideation, psychosis, and paranoia.  Patient has remained calm throughout assessment and has answered questions appropriatel  Visit Diagnosis:    ICD-10-CM   1. Recurrent major depressive disorder, in partial remission (HCC)  F33.41     2. Insomnia, unspecified type  G47.00       Past Psychiatric History:   Past Medical History:  Past Medical History:  Diagnosis Date   Anemia    Anxiety    At risk for sudden cardiac death 11/08/12   CHF - Combined Systolic + Diastolic Dysfunction. EF of 15-20% with Grade II diastolic dysfunction on echo 10/27/12 10/28/2012   CKD (chronic kidney disease)    Coronary artery disease    Depression    .   Dyspnea    none per pt on 04/23/22   Fibroids    Heart murmur    History of blood transfusion 2014   Hypertension    Hypothyroidism    S/P cardiac catheterization, 11-08-12, normal coronaries with minimal luminal irregularities in RCA system 10/30/2012   Thyroid disease    hypothyroidism    Past Surgical History:  Procedure Laterality Date  AORTIC VALVE REPLACEMENT N/A 03/12/2017   Procedure: AORTIC VALVE REPLACEMENT (AVR) with Mitral Annuloplasty;  Surgeon: Alleen Borne, MD;  Location: [facility] OR;  Service: Open Heart Surgery;  Laterality: N/A;   EXTERNAL FIXATION LEG Right 04/24/2022   Procedure: EXTERNAL FIXATION RIGHT LEG;   Surgeon: Netta Cedars, MD;  Location: [facility] OR;  Service: Orthopedics;  Laterality: Right;   EXTERNAL FIXATION REMOVAL Right 05/07/2022   Procedure: REMOVAL EXTERNAL FIXATION LEG right;  Surgeon: Netta Cedars, MD;  Location: [facility] OR;  Service: Orthopedics;  Laterality: Right;  120 min   HARDWARE REMOVAL Right 11/26/2022   Procedure: Right ankle removal of hardware (syndesmotic screws x2);  Surgeon: Netta Cedars, MD;  Location: [facility];  Service: Orthopedics;  Laterality: Right;  60   LEFT AND RIGHT HEART CATHETERIZATION WITH CORONARY ANGIOGRAM N/A 10/29/2012   Procedure: LEFT AND RIGHT HEART CATHETERIZATION WITH CORONARY ANGIOGRAM;  Surgeon: Marykay Lex, MD;  Location: [facility] CATH LAB;  Service: Cardiovascular;  Laterality: N/A;   MITRAL VALVE REPAIR N/A 03/12/2017   Procedure: MITRAL VALVE REPAIR (MVR);  Surgeon: Alleen Borne, MD;  Location: [facility] OR;  Service: Open Heart Surgery;  Laterality: N/A;  mitral annuloplasty   MULTIPLE EXTRACTIONS WITH ALVEOLOPLASTY N/A 01/21/2017   Procedure: Extraction of tooth #'s 4-13 with alveoloplasty;  Surgeon: Charlynne Pander, DDS;  Location: [facility] OR;  Service: Oral Surgery;  Laterality: N/A;   ORIF ANKLE FRACTURE Right 05/07/2022   Procedure: OPEN REDUCTION INTERNAL FIXATION (ORIF)right  ANKLE FRACTURE possible syndesmosis and/or deltoid fixation, possible allograft;  Surgeon: Netta Cedars, MD;  Location: [facility] OR;  Service: Orthopedics;  Laterality: Right;  120 min   RIGHT/LEFT HEART CATH AND CORONARY ANGIOGRAPHY N/A 01/09/2017   Procedure: Right/Left Heart Cath and Coronary Angiography;  Surgeon: Lennette Bihari, MD;  Location: [facility] INVASIVE CV LAB;  Service: Cardiovascular;  Laterality: N/A;   TEE WITHOUT CARDIOVERSION N/A 03/12/2017   Procedure: TRANSESOPHAGEAL ECHOCARDIOGRAM (TEE);  Surgeon: Alleen Borne, MD;  Location: [facility] OR;  Service: Open Heart Surgery;  Laterality: N/A;    Family Psychiatric History:   Family History:   Family History  Problem Relation Age of Onset   Stroke Mother    Hypertension Mother    Liver disease Mother        autoimmune   Heart disease Mother    Colon polyps Mother    Stroke Father    Hypertension Father    Hypertension Maternal Grandmother    Heart disease Maternal Grandmother    Bone cancer Paternal Grandfather    Bone cancer Maternal Uncle    Arthritis Paternal Uncle    Breast cancer Cousin     Social History:  Social History   Socioeconomic History   Marital status: Single    Spouse name: Not on file   Number of children: 1   Years of education: 12th   Highest education level: Not on file  Occupational History   Occupation: unemployed    Employer: [employer]  Tobacco Use   Smoking status: Former    Current packs/day: 0.00    Average packs/day: 0.5 packs/day for 5.0 years (2.5 ttl pk-yrs)    Types: Cigars, Cigarettes    Start date: 08/04/1973    Quit date: 08/04/1978    Years since quitting: 45.1   Smokeless tobacco: Never   Tobacco comments:    1 cigar daily-quit 10/24/12    Vaping Use   Vaping status: Never Used  Substance and Sexual Activity  Alcohol use: Not Currently    Comment: 2 drinks a week   Drug use: Not Currently    Frequency: 5.0 times per week    Types: Marijuana    Comment: none since 2015   Sexual activity: Not Currently    Birth control/protection: Post-menopausal  Other Topics Concern   Not on file  Social History Narrative   Patient lives at home alone. (Condo)   Caffeine use: 1/2 soda daily   Diet   Martial status:single   Is it one or more stories? Yes   How many persons live in your home? 1   Do you have any pets in your home? No   Current or past profession: Company secretary   Do you exercise? No   Do you have a living will? No    Do you have a DNR form? No    Do you have signed POA/HPOA forms? No    Social Drivers of Corporate investment banker Strain: Low Risk  (11/06/2017)   Overall Financial Resource  Strain (CARDIA)    Difficulty of Paying Living Expenses: Not hard at all  Food Insecurity: No Food Insecurity (11/22/2019)   Hunger Vital Sign    Worried About Running Out of Food in the Last Year: Never true    Ran Out of Food in the Last Year: Never true  Transportation Needs: No Transportation Needs (11/22/2019)   PRAPARE - Administrator, Civil Service (Medical): No    Lack of Transportation (Non-Medical): No  Physical Activity: Insufficiently Active (11/06/2017)   Exercise Vital Sign    Days of Exercise per Week: 5 days    Minutes of Exercise per Session: 20 min  Stress: Stress Concern Present (11/06/2017)   [employer] of Occupational Health - Occupational Stress Questionnaire    Feeling of Stress : To some extent  Social Connections: Moderately Isolated (11/06/2017)   Social Connection and Isolation Panel [NHANES]    Frequency of Communication with Friends and Family: More than three times a week    Frequency of Social Gatherings with Friends and Family: More than three times a week    Attends Religious Services: Never    Database administrator or Organizations: No    Attends Banker Meetings: Never    Marital Status: Never married    Allergies:  Allergies  Allergen Reactions   Codeine Hives   Rexulti [Brexpiprazole]     Stomach cramps   Wellbutrin [Bupropion]     Excessive sweating     Metabolic Disorder Labs: Lab Results  Component Value Date   HGBA1C 5.4 06/08/2019   MPG 108 06/08/2019   MPG 128 03/10/2017   No results found for: "PROLACTIN" Lab Results  Component Value Date   CHOL 129 04/20/2023   TRIG 75 04/20/2023   HDL 44 (L) 04/20/2023   CHOLHDL 2.9 04/20/2023   VLDL 11 09/26/2016   LDLCALC 69 04/20/2023   LDLCALC 78 05/19/2022   Lab Results  Component Value Date   TSH 3.35 09/19/2022   TSH 4.49 10/25/2021    Therapeutic Level Labs: No results found for: "LITHIUM" No results found for: "VALPROATE" No results found  for: "CBMZ"  Current Medications: Current Outpatient Medications  Medication Sig Dispense Refill   acetaminophen (TYLENOL) 500 MG tablet Take 1,000 mg by mouth every 6 (six) hours as needed for mild pain.     aspirin EC 81 MG EC tablet Take 1 tablet (81 mg total) by mouth  daily.     buPROPion (WELLBUTRIN XL) 150 MG 24 hr tablet Take 1 tablet by mouth once daily 90 tablet 0   carvedilol (COREG) 12.5 MG tablet Take 1 tablet by mouth twice daily 180 tablet 3   Cholecalciferol (D3) 50 MCG (2000 UT) TABS Take 2,000 Units by mouth daily.     ferrous sulfate 325 (65 FE) MG EC tablet Take 1 tablet (325 mg total) by mouth 2 (two) times daily. 180 tablet 3   fluticasone (FLONASE) 50 MCG/ACT nasal spray Place 1 spray into both nostrils 2 (two) times daily as needed for allergies or rhinitis. 16 g 5   furosemide (LASIX) 40 MG tablet Take 1 tablet by mouth once daily 90 tablet 3   levothyroxine (SYNTHROID) 100 MCG tablet TAKE 1 TABLET BY MOUTH ONCE DAILY BEFORE BREAKFAST 90 tablet 0   lisinopril (ZESTRIL) 2.5 MG tablet Take 1 tablet (2.5 mg total) by mouth daily. 90 tablet 3   QUEtiapine (SEROQUEL) 100 MG tablet Take 1 tablet (100 mg total) by mouth at bedtime as needed and may repeat dose one time if needed. 90 tablet 2   ramelteon (ROZEREM) 8 MG tablet Take 1 tablet (8 mg total) by mouth at bedtime. 30 tablet 0   rosuvastatin (CRESTOR) 40 MG tablet Take 1 tablet by mouth once daily 90 tablet 0   spironolactone (ALDACTONE) 25 MG tablet Take 1/2 (one-half) tablet by mouth once daily 45 tablet 3   No current facility-administered medications for this visit.     Musculoskeletal: Virtual platform  Psychiatric Specialty Exam: Review of Systems  Psychiatric/Behavioral:  Sleep disturbance: improving.   All other systems reviewed and are negative.   Last menstrual period 01/17/2015.There is no height or weight on file to calculate BMI.  General Appearance: Casual  Eye Contact:  Good  Speech:  Clear  and Coherent  Volume:  Normal  Mood:  Anxious and Depressed  Affect:  Congruent  Thought Process:  Coherent  Orientation:  Full (Time, Place, and Person)  Thought Content: Logical   Suicidal Thoughts:  No  Homicidal Thoughts:  No  Memory:  Immediate;   Good Recent;   Good  Judgement:  Good  Insight:  Good  Psychomotor Activity:  Normal  Concentration:  Concentration: Good  Recall:  Good  Fund of Knowledge: Good  Language: Good  Akathisia:  No  Handed:  Right  AIMS (if indicated): not done  Assets:  Communication Skills Desire for Improvement  ADL's:  Intact  Cognition: WNL  Sleep:  Good   Screenings: AUDIT    Flowsheet Row Office Visit from 09/27/2018 in [facility] Office Visit from 05/24/2018 in [facility]  Alcohol Use Disorder Identification Test Final Score (AUDIT) 4 3      Mini-Mental    Flowsheet Row Office Visit from 11/28/2022 in [facility]  Total Score (max 30 points ) 30      PHQ2-9    Flowsheet Row Office Visit from 06/29/2023 in [facility] Office Visit from 04/20/2023 in [facility] Office Visit from 11/28/2022 in [facility] Clinical Support from 11/21/2021 in [facility] Office Visit from 10/25/2021 in [facility]  PHQ-2 Total Score 3 0 0 3 0  PHQ-9 Total Score 6 -- --  8 --      Flowsheet Row Admission (Discharged) from 05/07/2022 in [place] [facility] Admission (Discharged) from 04/24/2022 in [place] [facility]  C-SSRS RISK CATEGORY No Risk No Risk        Assessment and Plan: Almer Vanecek 58 year old female presents for medication management follow-up appointment.  She is currently prescribed Wellbutrin 150 mg which is  prescribed by her primary care provider.  Initiated Seroquel 100 mg as needed patient reports she is currently taking medications nightly.  Discontinue Rozerem at previous visit.  States she has been resting well throughout the night.  Patient to continue medication as directed.  Will follow-up with metabolic panel and EKG at follow-up visit.  Patient to follow-up 4-5 months for medication management.   Major depressive disorder: Sleep disturbance:  Continue Wellbutrin 150 mg p.o. daily as prescribed by primary care provider -Continue Seroquel 100 mg at night as needed -Follow-up 4 months for medication management appointment  Collaboration of Care: Collaboration of Care: Medication Management AEB continue Seroquel as directed.  Continue Wellbutrin as indicated  Patient/Guardian was advised Release of Information must be obtained prior to any record release in order to collaborate their care with an outside provider. Patient/Guardian was advised if they have not already done so to contact the registration department to sign all necessary forms in order for [place] to release information regarding their care.   Consent: Patient/Guardian gives verbal consent for treatment and assignment of benefits for services provided during this visit. Patient/Guardian expressed understanding and agreed to proceed.    Oneta Rack, NP 09/01/2023, 2:57 PM

## 2023-10-19 ENCOUNTER — Encounter: Payer: Self-pay | Admitting: Nurse Practitioner

## 2023-10-19 ENCOUNTER — Ambulatory Visit (INDEPENDENT_AMBULATORY_CARE_PROVIDER_SITE_OTHER): Payer: PPO | Admitting: Nurse Practitioner

## 2023-10-19 VITALS — BP 118/72 | HR 64 | Temp 97.9°F | Ht 65.0 in | Wt 148.6 lb

## 2023-10-19 DIAGNOSIS — I5022 Chronic systolic (congestive) heart failure: Secondary | ICD-10-CM | POA: Diagnosis not present

## 2023-10-19 DIAGNOSIS — M8580 Other specified disorders of bone density and structure, unspecified site: Secondary | ICD-10-CM

## 2023-10-19 DIAGNOSIS — D500 Iron deficiency anemia secondary to blood loss (chronic): Secondary | ICD-10-CM | POA: Diagnosis not present

## 2023-10-19 DIAGNOSIS — E039 Hypothyroidism, unspecified: Secondary | ICD-10-CM

## 2023-10-19 DIAGNOSIS — F331 Major depressive disorder, recurrent, moderate: Secondary | ICD-10-CM

## 2023-10-19 DIAGNOSIS — K5900 Constipation, unspecified: Secondary | ICD-10-CM

## 2023-10-19 DIAGNOSIS — I10 Essential (primary) hypertension: Secondary | ICD-10-CM | POA: Diagnosis not present

## 2023-10-19 DIAGNOSIS — Z1211 Encounter for screening for malignant neoplasm of colon: Secondary | ICD-10-CM | POA: Diagnosis not present

## 2023-10-19 DIAGNOSIS — Z1212 Encounter for screening for malignant neoplasm of rectum: Secondary | ICD-10-CM | POA: Diagnosis not present

## 2023-10-19 DIAGNOSIS — R079 Chest pain, unspecified: Secondary | ICD-10-CM | POA: Diagnosis not present

## 2023-10-19 DIAGNOSIS — E782 Mixed hyperlipidemia: Secondary | ICD-10-CM | POA: Diagnosis not present

## 2023-10-19 NOTE — Progress Notes (Signed)
 Careteam: Patient Care Team: Sharon Seller, NP as PCP - General (Geriatric Medicine) Jake Bathe, MD as PCP - Cardiology (Cardiology) Allie Bossier, MD as Consulting Physician (Obstetrics and Gynecology) Gala Romney, Bevelyn Buckles, MD as Consulting Physician (Cardiology) Marjory Lies, Glenford Bayley, MD as Consulting Physician (Neurology)  PLACE OF SERVICE:  [facility]  Advanced Directive information    Allergies  Allergen Reactions   Codeine Hives   Rexulti [Brexpiprazole]     Stomach cramps   Wellbutrin [Bupropion]     Excessive sweating     Chief Complaint  Patient presents with   Medical Management of Chronic Issues    6 month follow-up.     Discussed the use of AI scribe software for clinical note transcription with the patient, who gave verbal consent to proceed.  History of Present Illness   Miranda [name] is a 58 year old female with heart failure who presents for a six-month follow-up.  She experiences intermittent chest pain described as a 'little tightness' occurring two to three times a week for the past two months. The pain resolves quickly, is not associated with exertion, shortness of breath, or radiation to the arm, and can occur at any time, including when lying down or after eating. No palpitations or increased swelling are noted.  Denies GERD or acid reflux.   Her current medications include lisinopril 2.5 mg daily, Coreg 12.5 mg twice daily, aspirin 81 mg daily, spironolactone 12.5 mg daily, and Crestor 40 mg daily for heart condition and hyperlipidemia.   She also takes Synthroid 100 mcg daily for hypothyroidism   Ongoing depression but well controlled on current regimen- Wellbutrin daily. She experiences sweating as a side effect of her psychiatric medication, which she discussed with her psychiatrist. Despite the sweating, she feels the medication is effective for her mental health.  Her sleep has improved with current medication adjustments, although  she occasionally wakes up in the middle of the night to use the bathroom, which happens about twice a week. It takes a while to fall back asleep after these awakenings.  She experiences constipation, having bowel movements about three times a week, sometimes with straining. She has been using prunes  Review of Systems:  Review of Systems  Constitutional:  Negative for chills, fever and weight loss.  HENT:  Negative for tinnitus.   Respiratory:  Negative for cough, sputum production and shortness of breath.   Cardiovascular:  Positive for chest pain. Negative for palpitations and leg swelling.  Gastrointestinal:  Negative for abdominal pain, constipation, diarrhea and heartburn.  Genitourinary:  Negative for dysuria, frequency and urgency.  Musculoskeletal:  Negative for back pain, falls, joint pain and myalgias.  Skin: Negative.   Neurological:  Negative for dizziness and headaches.  Psychiatric/Behavioral:  Negative for depression and memory loss. The patient does not have insomnia.     Past Medical History:  Diagnosis Date   Anemia    Anxiety    At risk for sudden cardiac death 11/11/12   CHF - Combined Systolic + Diastolic Dysfunction. EF of 15-20% with Grade II diastolic dysfunction on echo 10/27/12 10/28/2012   CKD (chronic kidney disease)    Coronary artery disease    Depression    .   Dyspnea    none per pt on 04/23/22   Fibroids    Heart murmur    History of blood transfusion 2014   Hypertension    Hypothyroidism    S/P cardiac catheterization, 11-11-12, normal coronaries with  minimal luminal irregularities in RCA system 10/30/2012   Thyroid disease    hypothyroidism   Past Surgical History:  Procedure Laterality Date   AORTIC VALVE REPLACEMENT N/A 03/12/2017   Procedure: AORTIC VALVE REPLACEMENT (AVR) with Mitral Annuloplasty;  Surgeon: Alleen Borne, MD;  Location: [facility] OR;  Service: Open Heart Surgery;  Laterality: N/A;   EXTERNAL FIXATION LEG Right 04/24/2022    Procedure: EXTERNAL FIXATION RIGHT LEG;  Surgeon: Netta Cedars, MD;  Location: [facility] OR;  Service: Orthopedics;  Laterality: Right;   EXTERNAL FIXATION REMOVAL Right 05/07/2022   Procedure: REMOVAL EXTERNAL FIXATION LEG right;  Surgeon: Netta Cedars, MD;  Location: [facility] OR;  Service: Orthopedics;  Laterality: Right;  120 min   HARDWARE REMOVAL Right 11/26/2022   Procedure: Right ankle removal of hardware (syndesmotic screws x2);  Surgeon: Netta Cedars, MD;  Location: [facility];  Service: Orthopedics;  Laterality: Right;  58   LEFT AND RIGHT HEART CATHETERIZATION WITH CORONARY ANGIOGRAM N/A 10/29/2012   Procedure: LEFT AND RIGHT HEART CATHETERIZATION WITH CORONARY ANGIOGRAM;  Surgeon: Marykay Lex, MD;  Location: [facility] CATH LAB;  Service: Cardiovascular;  Laterality: N/A;   MITRAL VALVE REPAIR N/A 03/12/2017   Procedure: MITRAL VALVE REPAIR (MVR);  Surgeon: Alleen Borne, MD;  Location: [facility] OR;  Service: Open Heart Surgery;  Laterality: N/A;  mitral annuloplasty   MULTIPLE EXTRACTIONS WITH ALVEOLOPLASTY N/A 01/21/2017   Procedure: Extraction of tooth #'s 4-13 with alveoloplasty;  Surgeon: Charlynne Pander, DDS;  Location: [facility] OR;  Service: Oral Surgery;  Laterality: N/A;   ORIF ANKLE FRACTURE Right 05/07/2022   Procedure: OPEN REDUCTION INTERNAL FIXATION (ORIF)right  ANKLE FRACTURE possible syndesmosis and/or deltoid fixation, possible allograft;  Surgeon: Netta Cedars, MD;  Location: [facility] OR;  Service: Orthopedics;  Laterality: Right;  120 min   RIGHT/LEFT HEART CATH AND CORONARY ANGIOGRAPHY N/A 01/09/2017   Procedure: Right/Left Heart Cath and Coronary Angiography;  Surgeon: Lennette Bihari, MD;  Location: [facility] INVASIVE CV LAB;  Service: Cardiovascular;  Laterality: N/A;   TEE WITHOUT CARDIOVERSION N/A 03/12/2017   Procedure: TRANSESOPHAGEAL ECHOCARDIOGRAM (TEE);  Surgeon: Alleen Borne, MD;  Location: [facility] OR;  Service: Open Heart Surgery;  Laterality: N/A;   Social  History:   reports that she quit smoking about 45 years ago. Her smoking use included cigars and cigarettes. She started smoking about 50 years ago. She has a 2.5 pack-year smoking history. She has never used smokeless tobacco. She reports that she does not currently use alcohol. She reports that she does not currently use drugs after having used the following drugs: Marijuana. Frequency: 5.00 times per week.  Family History  Problem Relation Age of Onset   Stroke Mother    Hypertension Mother    Liver disease Mother        autoimmune   Heart disease Mother    Colon polyps Mother    Stroke Father    Hypertension Father    Hypertension Maternal Grandmother    Heart disease Maternal Grandmother    Bone cancer Paternal Grandfather    Bone cancer Maternal Uncle    Arthritis Paternal Uncle    Breast cancer Cousin     Medications: Patient's Medications  New Prescriptions   No medications on file  Previous Medications   ACETAMINOPHEN (TYLENOL) 500 MG TABLET    Take 1,000 mg by mouth every 6 (six) hours as needed for mild pain.   ASPIRIN EC 81 MG EC TABLET    Take 1  tablet (81 mg total) by mouth daily.   BUPROPION (WELLBUTRIN XL) 150 MG 24 HR TABLET    Take 1 tablet by mouth once daily   CARVEDILOL (COREG) 12.5 MG TABLET    Take 1 tablet by mouth twice daily   CHOLECALCIFEROL (D3) 50 MCG (2000 UT) TABS    Take 2,000 Units by mouth daily.   FERROUS SULFATE 325 (65 FE) MG EC TABLET    Take 1 tablet (325 mg total) by mouth 2 (two) times daily.   FLUTICASONE (FLONASE) 50 MCG/ACT NASAL SPRAY    Place 1 spray into both nostrils 2 (two) times daily as needed for allergies or rhinitis.   FUROSEMIDE (LASIX) 40 MG TABLET    Take 1 tablet by mouth once daily   LEVOTHYROXINE (SYNTHROID) 100 MCG TABLET    TAKE 1 TABLET BY MOUTH ONCE DAILY BEFORE BREAKFAST   LISINOPRIL (ZESTRIL) 2.5 MG TABLET    Take 1 tablet (2.5 mg total) by mouth daily.   QUETIAPINE (SEROQUEL) 100 MG TABLET    Take 1 tablet (100 mg  total) by mouth at bedtime as needed and may repeat dose one time if needed.   ROSUVASTATIN (CRESTOR) 40 MG TABLET    Take 1 tablet by mouth once daily   SPIRONOLACTONE (ALDACTONE) 25 MG TABLET    Take 1/2 (one-half) tablet by mouth once daily  Modified Medications   No medications on file  Discontinued Medications   CALCIUM CARBONATE (OSCAL) 1500 (600 CA) MG TABS TABLET    Take 600 mg of elemental calcium by mouth 2 (two) times daily with a meal.    Physical Exam:  Vitals:   10/19/23 0957  BP: 118/72  Pulse: 64  Temp: 97.9 F (36.6 C)  TempSrc: Temporal  SpO2: 99%  Weight: 148 lb 9.6 oz (67.4 kg)  Height: 5\' 5"  (1.651 m)   Body mass index is 24.73 kg/m. Wt Readings from Last 3 Encounters:  10/19/23 148 lb 9.6 oz (67.4 kg)  04/20/23 144 lb (65.3 kg)  02/03/23 140 lb 6.4 oz (63.7 kg)    Physical Exam Constitutional:      General: She is not in acute distress.    Appearance: She is well-developed. She is not diaphoretic.  HENT:     Head: Normocephalic and atraumatic.     Mouth/Throat:     Pharynx: No oropharyngeal exudate.  Eyes:     Conjunctiva/sclera: Conjunctivae normal.     Pupils: Pupils are equal, round, and reactive to light.  Cardiovascular:     Rate and Rhythm: Normal rate and regular rhythm.     Heart sounds: Normal heart sounds.  Pulmonary:     Effort: Pulmonary effort is normal.     Breath sounds: Normal breath sounds.  Abdominal:     General: Bowel sounds are normal.     Palpations: Abdomen is soft.  Musculoskeletal:     Cervical back: Normal range of motion and neck supple.     Right lower leg: No edema.     Left lower leg: No edema.  Skin:    General: Skin is warm and dry.  Neurological:     Mental Status: She is alert.  Psychiatric:        Mood and Affect: Mood normal.     Labs reviewed: Basic Metabolic Panel: Recent Labs    02/03/23 0930 02/10/23 0948 02/17/23 0958  NA 141 141 142  K 3.2* 4.2 4.2  CL 103 103 105  CO2 23 24 23  GLUCOSE 107* 85 93  BUN 14 15 16   CREATININE 0.95 0.89 0.88  CALCIUM 10.1 10.3* 10.3*   Liver Function Tests: No results for input(s): "AST", "ALT", "ALKPHOS", "BILITOT", "PROT", "ALBUMIN" in the last 8760 hours. No results for input(s): "LIPASE", "AMYLASE" in the last 8760 hours. No results for input(s): "AMMONIA" in the last 8760 hours. CBC: Recent Labs    04/20/23 1047  WBC 4.9  NEUTROABS 3,357  HGB 11.9  HCT 37.9  MCV 87.7  PLT 163   Lipid Panel: Recent Labs    04/20/23 1047  CHOL 129  HDL 44*  LDLCALC 69  TRIG 75  CHOLHDL 2.9   TSH: No results for input(s): "TSH" in the last 8760 hours. A1C: Lab Results  Component Value Date   HGBA1C 5.4 06/08/2019     Assessment/Plan Assessment and Plan    Chest Pain Intermittent chest pain reported -discussed possibly due to acid reflux or cardiac causes. No exertional symptoms or radiation. -she has hx of CAD, CHF, HTN. Followed by cardiologist but no recent follow up - Order EKG showing PACs with nonspecific T waves, msg sent to cardiologist for review and follow up at this time.  Strict ED precautions given if symptoms worsen or new symptoms occur.  Heart Failure Stable on current medications with no shortness of breath or LE edema - Continue spironolactone, lisinopril, Lasix, and Coreg.  Hypothyroidism Managed with Synthroid. TSH levels need evaluation. - Check TSH levels. - Continue Synthroid 100 mcg daily.  Hyperlipidemia Well-controlled with Crestor. Last cholesterol check at goal. - Continue Crestor 40 mg daily. - Check liver enzymes.  Osteopenia - Discontinue calcium supplementation due to high levels in the past.  - Continue vitamin D supplementation. - Encourage weight-bearing activities.  Depression Well-controlled with Wellbutrin. Prefers current regimen despite sweating. - Continue Wellbutrin.  Constipation Bowel movements infrequent, prunes provide some relief. - Recommend MiraLAX,  starting with half a dose daily and increase as needed   Iron def anemia Follow up cbc Continues on supplement  Htn -Blood pressure well controlled, goal bp <140/90 Continue current medications and dietary modifications follow metabolic panel  General Health Maintenance Up to date on pneumonia vaccination. Cologuard screening and wellness visit due. - Order Cologuard test and send to home. - Schedule Medicare annual wellness visit for April 28th.      Return in about 6 months (around 04/20/2024) for routine follow up, labs at time of visit.  Janene Harvey. Biagio Borg [facility] [phone]

## 2023-10-20 LAB — CBC WITH DIFFERENTIAL/PLATELET
Absolute Lymphocytes: 1509 {cells}/uL (ref 850–3900)
Absolute Monocytes: 259 {cells}/uL (ref 200–950)
Basophils Absolute: 19 {cells}/uL (ref 0–200)
Basophils Relative: 0.4 %
Eosinophils Absolute: 89 {cells}/uL (ref 15–500)
Eosinophils Relative: 1.9 %
HCT: 40 % (ref 35.0–45.0)
Hemoglobin: 12.8 g/dL (ref 11.7–15.5)
MCH: 27.8 pg (ref 27.0–33.0)
MCHC: 32 g/dL (ref 32.0–36.0)
MCV: 86.8 fL (ref 80.0–100.0)
MPV: 11.7 fL (ref 7.5–12.5)
Monocytes Relative: 5.5 %
Neutro Abs: 2825 {cells}/uL (ref 1500–7800)
Neutrophils Relative %: 60.1 %
Platelets: 189 10*3/uL (ref 140–400)
RBC: 4.61 10*6/uL (ref 3.80–5.10)
RDW: 14 % (ref 11.0–15.0)
Total Lymphocyte: 32.1 %
WBC: 4.7 10*3/uL (ref 3.8–10.8)

## 2023-10-20 LAB — COMPLETE METABOLIC PANEL WITH GFR
AG Ratio: 0.8 (calc) — ABNORMAL LOW (ref 1.0–2.5)
ALT: 27 U/L (ref 6–29)
AST: 41 U/L — ABNORMAL HIGH (ref 10–35)
Albumin: 4.3 g/dL (ref 3.6–5.1)
Alkaline phosphatase (APISO): 77 U/L (ref 37–153)
BUN/Creatinine Ratio: 19 (calc) (ref 6–22)
BUN: 23 mg/dL (ref 7–25)
CO2: 25 mmol/L (ref 20–32)
Calcium: 10.3 mg/dL (ref 8.6–10.4)
Chloride: 101 mmol/L (ref 98–110)
Creat: 1.24 mg/dL — ABNORMAL HIGH (ref 0.50–1.03)
Globulin: 5.1 g/dL — ABNORMAL HIGH (ref 1.9–3.7)
Glucose, Bld: 54 mg/dL — ABNORMAL LOW (ref 65–139)
Potassium: 4.5 mmol/L (ref 3.5–5.3)
Sodium: 136 mmol/L (ref 135–146)
Total Bilirubin: 0.4 mg/dL (ref 0.2–1.2)
Total Protein: 9.4 g/dL — ABNORMAL HIGH (ref 6.1–8.1)
eGFR: 50 mL/min/{1.73_m2} — ABNORMAL LOW (ref >=60)

## 2023-10-20 LAB — TSH: TSH: 1.68 m[IU]/L (ref 0.40–4.50)

## 2023-10-21 ENCOUNTER — Ambulatory Visit: Attending: Physician Assistant | Admitting: Physician Assistant

## 2023-10-21 ENCOUNTER — Encounter: Payer: Self-pay | Admitting: Physician Assistant

## 2023-10-21 VITALS — BP 116/88 | HR 79 | Ht 65.0 in | Wt 146.6 lb

## 2023-10-21 DIAGNOSIS — I428 Other cardiomyopathies: Secondary | ICD-10-CM | POA: Diagnosis not present

## 2023-10-21 DIAGNOSIS — R079 Chest pain, unspecified: Secondary | ICD-10-CM | POA: Diagnosis not present

## 2023-10-21 DIAGNOSIS — Z9889 Other specified postprocedural states: Secondary | ICD-10-CM | POA: Diagnosis not present

## 2023-10-21 DIAGNOSIS — I5022 Chronic systolic (congestive) heart failure: Secondary | ICD-10-CM

## 2023-10-21 DIAGNOSIS — Z952 Presence of prosthetic heart valve: Secondary | ICD-10-CM

## 2023-10-21 DIAGNOSIS — I77810 Thoracic aortic ectasia: Secondary | ICD-10-CM

## 2023-10-21 DIAGNOSIS — F3341 Major depressive disorder, recurrent, in partial remission: Secondary | ICD-10-CM | POA: Diagnosis not present

## 2023-10-21 DIAGNOSIS — I10 Essential (primary) hypertension: Secondary | ICD-10-CM

## 2023-10-21 NOTE — Progress Notes (Signed)
 Cardiology Office Note:  .   Date:  10/21/2023  ID:  Miranda [name], DOB 1966/03/29, MRN [id number] PCP: Sharon Seller, NP  [facility] Providers Cardiologist:  Donato Schultz, MD {  History of Present Illness: .   Miranda [name] is a 58 y.o. female with a past medical history of hypertension, AVR repair and MVR repair in 2018 with a 23 mm [facility] Ease pericardial valve and mitral valve angioplasty with 28 mm Sorin 3D mammo, NICM with prior EF 35% and now up to 65%, hypertension, hypothyroidism, dilated aortic root (40 mm), and anxiety here for follow-up appointment.  She was last seen 11/24/2022 for surgical clearance.  During that visit her blood pressure was significantly elevated and patient required clonidine in the office for reduction.  She had clonidine 0.1 mg added to her current regimen along with carvedilol increased to 12.5 mg twice daily.  Documented blood pressures for a week with some improvement and then cleared for procedure which was successful.  On 6/14 PCP increased lisinopril to 10 mg daily.  Had an appoint with Pharm.D. 6/20 where clonidine was stopped and spironolactone 12.5 mg was started.  Surprisingly, K was low after starting spironolactone.  She was given 20 mEq KCl x 3 days and then 10 mEq x 4 days.  K improved and KCl stopped.  Previously on spironolactone 12.5 mg daily limited.  Several years ago was stopped due to symptomatic hypotension.  At her last pharmacy visit on 02/17/2023 patient brought in home blood pressure cuff showing some systolics less than 100 although in the office was 135/91.  Home BP cuff was tested and measured accurately.  Lisinopril was decreased to 5 mg daily and carvedilol and spironolactone were continued at current doses.  BMP on 7/16 showed K within normal limits and creatinine at baseline 0.88.  Patient presented August 2024 was feeling okay.  Occasional lightheadedness that correlated with lower BP readings.  Of note had not  taken lisinopril yet that morning and BP was 104/70.  Also taking furosemide 40 mg daily for lower extremity edema and reported was well-controlled.  Of note, spironolactone was not listed in fill history although she reports taking it in the evening.  Denies headaches and blurry vision.  No lower extremity edema at that time.  Medications at that visit included lisinopril 5 mg daily in the a.m., carvedilol 12.5 mg twice a day, spironolactone 12.5 mg daily (PM), furosemide 40 mg daily.  Today, she presents  with a history of mitral and aortic valve surgeries who presents with chest discomfort at rest.  Chest discomfort occurs primarily at rest, lasting about a minute or less, without exacerbation from physical activity. There is no associated dyspnea, peripheral edema, or palpitations. Lightheadedness occurs when bending over and resolves upon sitting.  Her cardiac history includes mitral valve repair and aortic valve replacement, with a slight enlargement of the aorta. A previous echocardiogram showed normal cardiac function. A past cardiac catheterization revealed a 50% blockage in one coronary artery.  Current medications include lisinopril 2.5 mg, carvedilol 12.5 mg, spironolactone 12.5 mg, and furosemide 40 mg, with no significant side effects. Wellbutrin is taken, causing sweating.  Recent labs indicate a slight decline in renal function compared to eight months ago, with normal thyroid and blood cell counts. Lipid panel shows an LDL of 69.  She walks approximately three times a week for 20 minutes without symptoms during these activities.  Reports no shortness of breath nor dyspnea on exertion. Marland Kitchen  No edema, orthopnea, PND. Reports no palpitations.    Discussed the use of AI scribe software for clinical note transcription with the patient, who gave verbal consent to proceed.  ROS: pertinent ROS in HPI   Studies Reviewed: .       Echo 05/14/20 IMPRESSIONS     1. Left ventricular  ejection fraction, by estimation, is 60 to 65%. The  left ventricle has normal function. The left ventricle has no regional  wall motion abnormalities. There is moderate left ventricular hypertrophy.  Left ventricular diastolic  parameters are consistent with Grade II diastolic dysfunction  (pseudonormalization). Elevated left atrial pressure.   2. Right ventricular systolic function is mildly reduced. The right  ventricular size is normal. There is normal pulmonary artery systolic  pressure. The estimated right ventricular systolic pressure is 29.8 mmHg.   3. Left atrial size was severely dilated.   4. The mitral valve has been repaired/replaced. There is a 28 mm  prosthetic annuloplasty ring present in the mitral position. Mild to  moderate mitral valve regurgitation. Mild mitral stenosis. MG 4 mmHg at HR  62 bpm, MVA 1.9 cm^2 by continuity equation.   5. The aortic valve has been repaired/replaced. There is a 23 mm  bioprosthetic valve present in the aortic position. Aortic valve  regurgitation is not visualized. Echo findings are consistent with normal  structure and function of the aortic valve  prosthesis. MG 9 mmHg, stable from prior echo on 10/19/17.   6. Aortic dilatation noted. There is mild dilatation of the aortic root,  measuring 42 mm. There is mild dilatation of the ascending aorta,  measuring 40 mm.   7. The inferior vena cava is normal in size with greater than 50%  respiratory variability, suggesting right atrial pressure of 3 mmHg.   FINDINGS   Left Ventricle: Left ventricular ejection fraction, by estimation, is 60  to 65%. The left ventricle has normal function. The left ventricle has no  regional wall motion abnormalities. The left ventricular internal cavity  size was normal in size. There is   moderate left ventricular hypertrophy. Left ventricular diastolic  parameters are consistent with Grade II diastolic dysfunction  (pseudonormalization). Elevated left  atrial pressure.   Right Ventricle: The right ventricular size is normal. Right vetricular  wall thickness was not assessed. Right ventricular systolic function is  mildly reduced. There is normal pulmonary artery systolic pressure. The  tricuspid regurgitant velocity is 2.59   m/s, and with an assumed right atrial pressure of 3 mmHg, the estimated  right ventricular systolic pressure is 29.8 mmHg.   Left Atrium: Left atrial size was severely dilated.   Right Atrium: Right atrial size was normal in size.   Pericardium: There is no evidence of pericardial effusion.   Mitral Valve: The mitral valve has been repaired/replaced. Mild to  moderate mitral valve regurgitation. There is a 28 mm prosthetic  annuloplasty ring present in the mitral position. Mild mitral valve  stenosis. MV peak gradient, 11.4 mmHg. The mean mitral   valve gradient is 4.0 mmHg.   Tricuspid Valve: The tricuspid valve is normal in structure. Tricuspid  valve regurgitation is trivial.   Aortic Valve: The aortic valve has been repaired/replaced. Aortic valve  regurgitation is not visualized. Aortic valve mean gradient measures 9.0  mmHg. Aortic valve peak gradient measures 16.5 mmHg. Aortic valve area, by  VTI measures 2.07 cm. There is a  23 mm bioprosthetic valve present in the aortic position. Echo findings  are consistent  with normal structure and function of the aortic valve  prosthesis.   Pulmonic Valve: The pulmonic valve was normal in structure. Pulmonic valve  regurgitation is trivial.   Aorta: Aortic dilatation noted. There is mild dilatation of the aortic  root, measuring 42 mm. There is mild dilatation of the ascending aorta,  measuring 40 mm.   Venous: The inferior vena cava is normal in size with greater than 50%  respiratory variability, suggesting right atrial pressure of 3 mmHg.   IAS/Shunts: No atrial level shunt detected by color flow Doppler.        Physical Exam:   VS:  BP 116/88    Pulse 79   Ht 5\' 5"  (1.651 m)   Wt 146 lb 9.6 oz (66.5 kg)   LMP 01/17/2015   SpO2 99%   BMI 24.40 kg/m    Wt Readings from Last 3 Encounters:  10/21/23 146 lb 9.6 oz (66.5 kg)  10/19/23 148 lb 9.6 oz (67.4 kg)  04/20/23 144 lb (65.3 kg)    GEN: Well nourished, well developed in no acute distress NECK: No JVD; No carotid bruits CARDIAC: RRR, no murmurs, rubs, gallops RESPIRATORY:  Clear to auscultation without rales, wheezing or rhonchi  ABDOMEN: Soft, non-tender, non-distended EXTREMITIES:  No edema; No deformity   ASSESSMENT AND PLAN: .   Chest Pain Intermittent chest pain, atypical for ischemic heart disease. EKG shows frequent atrial ectopic beats. Previous catheterization revealed 50% stenosis in right coronary artery. Non-cardiac causes considered. - Order echocardiogram to assess heart valves and aorta. - Consider stress test for further coronary artery evaluation if needed.  Aortic Valve Replacement and Mitral Valve Repair Assessment needed for valve function and aortic enlargement due to previous interventions. - Order echocardiogram to evaluate valve function and aortic size.  Lightheadedness Likely due to blood pressure changes. Echocardiogram to evaluate potential valve-related causes. - Evaluate for potential valve issues with echocardiogram.  Hypertension Managed with lisinopril, carvedilol, spironolactone, and furosemide. Slightly elevated diastolic blood pressure. No significant medication side effects. - Monitor blood pressure regularly. - Follow up in four months with cardiologist.  Hyperlipidemia Lipid panel shows LDL at 69 mg/dL, at goal, and normal triglycerides. Low cardiovascular risk. - Continue current management regimen.      Dispo: She can follow-up in 4 months with Dr. Anne Fu  Signed, Sharlene Dory, PA-C

## 2023-10-21 NOTE — Patient Instructions (Signed)
 Medication Instructions:  Your physician recommends that you continue on your current medications as directed. Please refer to the Current Medication list given to you today.  *If you need a refill on your cardiac medications before your next appointment, please call your pharmacy*   Lab Work: NONE If you have labs (blood work) drawn today and your tests are completely normal, you will receive your results only by: MyChart Message (if you have MyChart) OR A paper copy in the mail If you have any lab test that is abnormal or we need to change your treatment, we will call you to review the results.   Testing/Procedures: Echocardiogram Your physician has requested that you have an echocardiogram. Echocardiography is a painless test that uses sound waves to create images of your heart. It provides your doctor with information about the size and shape of your heart and how well your heart's chambers and valves are working. This procedure takes approximately one hour. There are no restrictions for this procedure. Please do NOT wear cologne, perfume, aftershave, or lotions (deodorant is allowed). Please arrive 15 minutes prior to your appointment time.  Please note: We ask at that you not bring children with you during ultrasound (echo/ vascular) testing. Due to room size and safety concerns, children are not allowed in the ultrasound rooms during exams. Our front office staff cannot provide observation of children in our lobby area while testing is being conducted. An adult accompanying a patient to their appointment will only be allowed in the ultrasound room at the discretion of the ultrasound technician under special circumstances. We apologize for any inconvenience.   Follow-Up: At [facility], you and your health needs are our priority.  As part of our continuing mission to provide you with exceptional heart care, we have created designated Provider Care Teams.  These Care Teams  include your primary Cardiologist (physician) and Advanced Practice Providers (APPs -  Physician Assistants and Nurse Practitioners) who all work together to provide you with the care you need, when you need it.  We recommend signing up for the patient portal called "MyChart".  Sign up information is provided on this After Visit Summary.  MyChart is used to connect with patients for Virtual Visits (Telemedicine).  Patients are able to view lab/test results, encounter notes, upcoming appointments, etc.  Non-urgent messages can be sent to your provider as well.   To learn more about what you can do with MyChart, go to [email or web address].    Your next appointment:   4 months   Provider:   Anne Fu , MD  Other Instructions Track blood pressure 1 hour after meds for 2 weeks and send [place] the results on MyChart    1st Floor: - Lobby - Registration  - Pharmacy  - Lab - Cafe  2nd Floor: - PV Lab - Diagnostic Testing (echo, CT, nuclear med)  3rd Floor: - Vacant  4th Floor: - TCTS (cardiothoracic surgery) - AFib Clinic - Structural Heart Clinic - Vascular Surgery  - Vascular Ultrasound  5th Floor: - HeartCare Cardiology (general and EP) - Clinical Pharmacy for coumadin, hypertension, lipid, weight-loss medications, and med management appointments    Valet parking services will be available as well.

## 2023-10-22 ENCOUNTER — Encounter: Payer: Self-pay | Admitting: Nurse Practitioner

## 2023-10-22 ENCOUNTER — Ambulatory Visit (HOSPITAL_COMMUNITY): Attending: Cardiovascular Disease

## 2023-10-22 DIAGNOSIS — H40033 Anatomical narrow angle, bilateral: Secondary | ICD-10-CM | POA: Diagnosis not present

## 2023-10-22 DIAGNOSIS — H2513 Age-related nuclear cataract, bilateral: Secondary | ICD-10-CM | POA: Diagnosis not present

## 2023-10-22 DIAGNOSIS — R079 Chest pain, unspecified: Secondary | ICD-10-CM

## 2023-10-22 LAB — ECHOCARDIOGRAM COMPLETE
AR max vel: 1.33 cm2
AV Area VTI: 1.6 cm2
AV Area mean vel: 1.82 cm2
AV Mean grad: 4 mmHg
AV Peak grad: 14.4 mmHg
Ao pk vel: 1.9 m/s
Area-P 1/2: 2.63 cm2
MV VTI: 1.09 cm2
S' Lateral: 2.6 cm

## 2023-10-25 ENCOUNTER — Other Ambulatory Visit: Payer: Self-pay | Admitting: Nurse Practitioner

## 2023-10-25 DIAGNOSIS — J302 Other seasonal allergic rhinitis: Secondary | ICD-10-CM

## 2023-10-26 ENCOUNTER — Encounter: Payer: Self-pay | Admitting: Physician Assistant

## 2023-10-27 DIAGNOSIS — Z1212 Encounter for screening for malignant neoplasm of rectum: Secondary | ICD-10-CM | POA: Diagnosis not present

## 2023-10-27 DIAGNOSIS — Z1211 Encounter for screening for malignant neoplasm of colon: Secondary | ICD-10-CM | POA: Diagnosis not present

## 2023-10-31 LAB — COLOGUARD: COLOGUARD: NEGATIVE

## 2023-11-26 ENCOUNTER — Other Ambulatory Visit: Payer: Self-pay | Admitting: Nurse Practitioner

## 2023-11-26 DIAGNOSIS — F331 Major depressive disorder, recurrent, moderate: Secondary | ICD-10-CM

## 2023-11-26 DIAGNOSIS — E782 Mixed hyperlipidemia: Secondary | ICD-10-CM

## 2023-11-26 NOTE — Telephone Encounter (Signed)
 High risk warning for wellbutrin 

## 2023-11-30 ENCOUNTER — Encounter: Payer: Self-pay | Admitting: Nurse Practitioner

## 2023-11-30 ENCOUNTER — Ambulatory Visit (INDEPENDENT_AMBULATORY_CARE_PROVIDER_SITE_OTHER): Payer: HMO | Admitting: Nurse Practitioner

## 2023-11-30 VITALS — BP 124/78 | HR 62 | Temp 97.3°F | Ht 65.0 in | Wt 148.8 lb

## 2023-11-30 DIAGNOSIS — Z0000 Encounter for general adult medical examination without abnormal findings: Secondary | ICD-10-CM

## 2023-11-30 NOTE — Progress Notes (Signed)
 Subjective:   Miranda [name] is a 58 y.o. female who presents for Medicare Annual (Subsequent) preventive examination.  Visit Complete: In person    Cardiac Risk Factors include: hypertension;dyslipidemia     Objective:    Today's Vitals   11/30/23 0907  BP: 124/78  Pulse: 62  Temp: (!) 97.3 F (36.3 C)  SpO2: 100%  Weight: 148 lb 12.8 oz (67.5 kg)  Height: 5\' 5"  (1.651 m)   Body mass index is 24.76 kg/m.     11/30/2023    9:11 AM 04/20/2023   10:09 AM 03/10/2023    9:00 AM 01/16/2023    9:40 AM 11/28/2022   10:32 AM 11/26/2022    7:09 AM 11/19/2022   10:14 AM  Advanced Directives  Does Patient Have a Medical Advance Directive? No No No No No No No  Copy of Healthcare Power of Attorney in Chart?     No - copy requested No - copy requested No - copy requested  Would patient like information on creating a medical advance directive? No - Patient declined  No - Patient declined No - Patient declined   No - Patient declined    Current Medications (verified) Outpatient Encounter Medications as of 11/30/2023  Medication Sig   acetaminophen  (TYLENOL ) 500 MG tablet Take 1,000 mg by mouth every 6 (six) hours as needed for mild pain.   aspirin  EC 81 MG EC tablet Take 1 tablet (81 mg total) by mouth daily.   buPROPion  (WELLBUTRIN  XL) 150 MG 24 hr tablet Take 1 tablet by mouth once daily   carvedilol  (COREG ) 12.5 MG tablet Take 1 tablet by mouth twice daily   Cholecalciferol (D3) 50 MCG (2000 UT) TABS Take 2,000 Units by mouth daily.   ferrous sulfate  325 (65 FE) MG EC tablet Take 1 tablet (325 mg total) by mouth 2 (two) times daily.   fluticasone  (FLONASE ) 50 MCG/ACT nasal spray USE 1 SPRAY(S) IN EACH NOSTRIL TWICE DAILY AS NEEDED FOR ALLERGIES OR  RHINITIS   furosemide  (LASIX ) 40 MG tablet Take 1 tablet by mouth once daily   levothyroxine  (SYNTHROID ) 100 MCG tablet TAKE 1 TABLET BY MOUTH ONCE DAILY BEFORE BREAKFAST   lisinopril  (ZESTRIL ) 2.5 MG tablet Take 1 tablet (2.5 mg total)  by mouth daily.   QUEtiapine  (SEROQUEL ) 100 MG tablet Take 1 tablet (100 mg total) by mouth at bedtime as needed and may repeat dose one time if needed.   rosuvastatin  (CRESTOR ) 40 MG tablet Take 1 tablet by mouth once daily   spironolactone  (ALDACTONE ) 25 MG tablet Take 1/2 (one-half) tablet by mouth once daily   No facility-administered encounter medications on file as of 11/30/2023.    Allergies (verified) Codeine, Rexulti  [brexpiprazole ], and Wellbutrin  [bupropion ]   History: Past Medical History:  Diagnosis Date   Anemia    Anxiety    At risk for sudden cardiac death Nov 24, 2012   CHF - Combined Systolic + Diastolic Dysfunction. EF of 15-20% with Grade II diastolic dysfunction on echo 10/27/12 10/28/2012   CKD (chronic kidney disease)    Coronary artery disease    Depression    .   Dyspnea    none per pt on 04/23/22   Fibroids    Heart murmur    History of blood transfusion 2014   Hypertension    Hypothyroidism    S/P cardiac catheterization, 11/24/2012, normal coronaries with minimal luminal irregularities in RCA system 10/30/2012   Thyroid  disease    hypothyroidism   Past Surgical History:  Procedure Laterality Date   AORTIC VALVE REPLACEMENT N/A 03/12/2017   Procedure: AORTIC VALVE REPLACEMENT (AVR) with Mitral Annuloplasty;  Surgeon: Bartley Lightning, MD;  Location: [facility] OR;  Service: Open Heart Surgery;  Laterality: N/A;   EXTERNAL FIXATION LEG Right 04/24/2022   Procedure: EXTERNAL FIXATION RIGHT LEG;  Surgeon: Ali Ink, MD;  Location: [facility] OR;  Service: Orthopedics;  Laterality: Right;   EXTERNAL FIXATION REMOVAL Right 05/07/2022   Procedure: REMOVAL EXTERNAL FIXATION LEG right;  Surgeon: Ali Ink, MD;  Location: [facility] OR;  Service: Orthopedics;  Laterality: Right;  120 min   HARDWARE REMOVAL Right 11/26/2022   Procedure: Right ankle removal of hardware (syndesmotic screws x2);  Surgeon: Ali Ink, MD;  Location: [facility];  Service:  Orthopedics;  Laterality: Right;  60   LEFT AND RIGHT HEART CATHETERIZATION WITH CORONARY ANGIOGRAM N/A 10/29/2012   Procedure: LEFT AND RIGHT HEART CATHETERIZATION WITH CORONARY ANGIOGRAM;  Surgeon: Arleen Lacer, MD;  Location: [facility] CATH LAB;  Service: Cardiovascular;  Laterality: N/A;   MITRAL VALVE REPAIR N/A 03/12/2017   Procedure: MITRAL VALVE REPAIR (MVR);  Surgeon: Bartley Lightning, MD;  Location: [facility] OR;  Service: Open Heart Surgery;  Laterality: N/A;  mitral annuloplasty   MULTIPLE EXTRACTIONS WITH ALVEOLOPLASTY N/A 01/21/2017   Procedure: Extraction of tooth #'s 4-13 with alveoloplasty;  Surgeon: Carol Chroman, DDS;  Location: [facility] OR;  Service: Oral Surgery;  Laterality: N/A;   ORIF ANKLE FRACTURE Right 05/07/2022   Procedure: OPEN REDUCTION INTERNAL FIXATION (ORIF)right  ANKLE FRACTURE possible syndesmosis and/or deltoid fixation, possible allograft;  Surgeon: Ali Ink, MD;  Location: [facility] OR;  Service: Orthopedics;  Laterality: Right;  120 min   RIGHT/LEFT HEART CATH AND CORONARY ANGIOGRAPHY N/A 01/09/2017   Procedure: Right/Left Heart Cath and Coronary Angiography;  Surgeon: Millicent Ally, MD;  Location: [facility] INVASIVE CV LAB;  Service: Cardiovascular;  Laterality: N/A;   TEE WITHOUT CARDIOVERSION N/A 03/12/2017   Procedure: TRANSESOPHAGEAL ECHOCARDIOGRAM (TEE);  Surgeon: Bartley Lightning, MD;  Location: [facility] OR;  Service: Open Heart Surgery;  Laterality: N/A;   Family History  Problem Relation Age of Onset   Stroke Mother    Hypertension Mother    Liver disease Mother        autoimmune   Heart disease Mother    Colon polyps Mother    Stroke Father    Hypertension Father    Hypertension Maternal Grandmother    Heart disease Maternal Grandmother    Bone cancer Paternal Grandfather    Bone cancer Maternal Uncle    Arthritis Paternal Uncle    Breast cancer Cousin    Social History   Socioeconomic History   Marital status: Single    Spouse name: Not on file   Number of  children: 1   Years of education: 12th   Highest education level: Not on file  Occupational History   Occupation: unemployed    Employer: [employer]  Tobacco Use   Smoking status: Former    Current packs/day: 0.00    Average packs/day: 0.5 packs/day for 5.0 years (2.5 ttl pk-yrs)    Types: Cigars, Cigarettes    Start date: 08/04/1973    Quit date: 08/04/1978    Years since quitting: 45.3   Smokeless tobacco: Never   Tobacco comments:    1 cigar daily-quit 10/24/12    Vaping Use   Vaping status: Never Used  Substance and Sexual Activity   Alcohol use: Not Currently  Comment: 2 drinks a week   Drug use: Not Currently    Frequency: 5.0 times per week    Types: Marijuana    Comment: none since 2015   Sexual activity: Not Currently    Birth control/protection: Post-menopausal  Other Topics Concern   Not on file  Social History Narrative   Patient lives at home alone. (Condo)   Caffeine use: 1/2 soda daily   Diet   Martial status:single   Is it one or more stories? Yes   How many persons live in your home? 1   Do you have any pets in your home? No   Current or past profession: Company secretary   Do you exercise? No   Do you have a living will? No    Do you have a DNR form? No    Do you have signed POA/HPOA forms? No    Social Drivers of Corporate investment banker Strain: Patient Declined (10/16/2023)   Overall Financial Resource Strain (CARDIA)    Difficulty of Paying Living Expenses: Patient declined  Food Insecurity: Patient Declined (10/16/2023)   Hunger Vital Sign    Worried About Running Out of Food in the Last Year: Patient declined    Ran Out of Food in the Last Year: Patient declined  Transportation Needs: Patient Declined (10/16/2023)   PRAPARE - Administrator, Civil Service (Medical): Patient declined    Lack of Transportation (Non-Medical): Patient declined  Physical Activity: Insufficiently Active (10/16/2023)   Exercise Vital Sign     Days of Exercise per Week: 3 days    Minutes of Exercise per Session: 20 min  Stress: Stress Concern Present (10/16/2023)   [employer] of Occupational Health - Occupational Stress Questionnaire    Feeling of Stress : To some extent  Social Connections: Unknown (10/16/2023)   Social Connection and Isolation Panel [NHANES]    Frequency of Communication with Friends and Family: More than three times a week    Frequency of Social Gatherings with Friends and Family: Patient declined    Attends Religious Services: Patient declined    Database administrator or Organizations: No    Attends Engineer, structural: Not on file    Marital Status: Never married    Tobacco Counseling Counseling given: Not Answered Tobacco comments: 1 cigar daily-quit 10/24/12     Clinical Intake:  Pre-visit preparation completed: Yes  Pain : No/denies pain     BMI - recorded: 24 Nutritional Status: BMI of 19-24  Normal Nutritional Risks: None Diabetes: No  How often do you need to have someone help you when you read instructions, pamphlets, or other written materials from your doctor or pharmacy?: 1 - Never         Activities of Daily Living    11/30/2023    9:09 AM  In your present state of health, do you have any difficulty performing the following activities:  Hearing? 0  Vision? 0  Difficulty concentrating or making decisions? 1  Walking or climbing stairs? 0  Dressing or bathing? 0  Doing errands, shopping? 0  Preparing Food and eating ? N  Using the Toilet? N  In the past six months, have you accidently leaked urine? N  Do you have problems with loss of bowel control? N  Managing your Medications? N  Managing your Finances? N  Housekeeping or managing your Housekeeping? N    Patient Care Team: Verma Gobble, NP as PCP - General (Geriatric  Medicine) Hugh Madura, MD as PCP - Cardiology (Cardiology) Ana Balling, MD as Consulting Physician (Obstetrics and  Gynecology) Julane Ny, Rheta Celestine, MD as Consulting Physician (Cardiology) Omega Bible, MD as Consulting Physician (Neurology)  Indicate any recent Medical Services you may have received from other than [facility] providers in the past year (date may be approximate).     Assessment:   This is a routine wellness examination for Gregory.  Hearing/Vision screen Vision Screening - Comments:: [facility] 10/22/2023   Goals Addressed               This Visit's Progress     maintain lifestyle (pt-stated)   On track     Starting 11/03/2016 I would like to maintain my lifestyle. Encouraged to continue to fish and remain active       Depression Screen    11/30/2023    9:09 AM 10/19/2023   10:00 AM 06/29/2023    2:05 PM 04/20/2023   10:09 AM 11/28/2022   10:33 AM 11/21/2021   10:43 AM 10/25/2021    9:08 AM  PHQ 2/9 Scores  PHQ - 2 Score 0   0 0 3 0  PHQ- 9 Score      8   Exception Documentation  Other- indicate reason in comment box     Other- indicate reason in comment box  Not completed  Seen psych     Connected with someone from [facility]     Information is confidential and restricted. Go to Review Flowsheets to unlock data.    Fall Risk    11/30/2023    9:08 AM 10/19/2023   10:00 AM 04/20/2023   10:09 AM 01/16/2023    9:40 AM 11/28/2022   10:33 AM  Fall Risk   Falls in the past year? 1 0 0 0 1  Number falls in past yr: 0 0  0 0  Injury with Fall? 0 0  0 1  Risk for fall due to : No Fall Risks No Fall Risks  No Fall Risks History of fall(s)  Follow up Falls evaluation completed Falls evaluation completed  Falls evaluation completed Falls evaluation completed    MEDICARE RISK AT HOME: Medicare Risk at Home Any stairs in or around the home?: Yes If so, are there any without handrails?: No Home free of loose throw rugs in walkways, pet beds, electrical cords, etc?: No Adequate lighting in your home to reduce risk of falls?: Yes Life alert?: No Use of a cane,  walker or w/c?: No Grab bars in the bathroom?: Yes Shower chair or bench in shower?: No Elevated toilet seat or a handicapped toilet?: No  TIMED UP AND GO:  Was the test performed?  No    Cognitive Function:    11/28/2022   10:34 AM 11/03/2016    9:34 AM  MMSE - Mini Mental State Exam  Not completed:  Unable to complete  Orientation to time 5   Orientation to Place 5   Registration 3   Attention/ Calculation 5   Recall 3   Language- name 2 objects 2   Language- repeat 1   Language- follow 3 step command 3   Language- read & follow direction 1   Write a sentence 1   Copy design 1   Total score 30         11/30/2023    9:12 AM 11/21/2021   10:49 AM 11/13/2020    9:37 AM 11/11/2019  9:03 AM 11/09/2018   10:30 AM  6CIT Screen  What Year? 0 points 0 points 0 points 0 points 0 points  What month? 0 points 0 points 0 points 0 points 0 points  What time? 0 points 0 points 0 points 0 points 0 points  Count back from 20 0 points 0 points 0 points 0 points 0 points  Months in reverse 0 points 0 points 0 points 0 points 0 points  Repeat phrase 0 points 0 points 0 points 0 points 4 points  Total Score 0 points 0 points 0 points 0 points 4 points    Immunizations Immunization History  Administered Date(s) Administered   Hep A / Hep B 09/08/2017, 10/09/2017, 03/11/2018   Influenza, High Dose Seasonal PF 05/24/2018   Influenza, Seasonal, Injecte, Preservative Fre 04/20/2023   Influenza,inj,Quad PF,6+ Mos 05/26/2016, 05/07/2017, 06/13/2019, 04/19/2020, 04/22/2021, 04/21/2022   PFIZER(Purple Top)SARS-COV-2 Vaccination 10/20/2019, 11/10/2019, 05/26/2020, 11/05/2020   Pfizer Covid-19 Vaccine Bivalent Booster 30yrs & up 10/16/2021   Pfizer(Comirnaty)Fall Seasonal Vaccine 12 years and older 06/11/2023   Pneumococcal Conjugate-13 12/14/2014   Pneumococcal Polysaccharide-23 01/30/2017   Tdap 01/25/2016   Zoster Recombinant(Shingrix) 11/06/2017, 01/28/2018    TDAP status: Up to  date  Flu Vaccine status: Up to date  Pneumococcal vaccine status: Up to date  Covid-19 vaccine status: Information provided on how to obtain vaccines.   Qualifies for Shingles Vaccine? Yes   Zostavax completed No   Shingrix Completed?: Yes  Screening Tests Health Maintenance  Topic Date Due   Pneumococcal Vaccine 43-104 Years old (3 of 3 - PCV20 or PCV21) 01/30/2022   COVID-19 Vaccine (7 - Pfizer risk 2024-25 season) 12/09/2023   INFLUENZA VACCINE  03/04/2024   Medicare Annual Wellness (AWV)  11/29/2024   MAMMOGRAM  01/07/2025   DEXA SCAN  06/16/2025   Cervical Cancer Screening (HPV/Pap Cotest)  10/16/2025   DTaP/Tdap/Td (2 - Td or Tdap) 01/24/2026   Fecal DNA (Cologuard)  10/27/2026   Hepatitis C Screening  Completed   HIV Screening  Completed   Zoster Vaccines- Shingrix  Completed   HPV VACCINES  Aged Out   Meningococcal B Vaccine  Aged Out    Health Maintenance  Health Maintenance Due  Topic Date Due   Pneumococcal Vaccine 36-2 Years old (3 of 3 - PCV20 or PCV21) 01/30/2022    Colorectal cancer screening: Type of screening: Cologuard. Completed 10/2023. Repeat every 3 years  Mammogram status: Completed 01/08/2023. Repeat every year  Bone Density status: Completed 06/05/2023. Results reflect: Bone density results: OSTEOPENIA. Repeat every 2 years.  Lung Cancer Screening: (Low Dose CT Chest recommended if Age 30-80 years, 20 pack-year currently smoking OR have quit w/in 15years.) does not qualify.   Lung Cancer Screening Referral: na  Additional Screening:  Hepatitis C Screening: does qualify; Completed na  Vision Screening: Recommended annual ophthalmology exams for early detection of glaucoma and other disorders of the eye. Is the patient up to date with their annual eye exam?  Yes  Who is the provider or what is the name of the office in which the patient attends annual eye exams? [facility] If pt is not established with a provider, would they like to  be referred to a provider to establish care? No .   Dental Screening: Recommended annual dental exams for proper oral hygiene   Community Resource Referral / Chronic Care Management: CRR required this visit?  No   CCM required this visit?  No  Plan:     I have personally reviewed and noted the following in the patient's chart:   Medical and social history Use of alcohol, tobacco or illicit drugs  Current medications and supplements including opioid prescriptions. Patient is not currently taking opioid prescriptions. Functional ability and status Nutritional status Physical activity Advanced directives List of other physicians Hospitalizations, surgeries, and ER visits in previous 12 months Vitals Screenings to include cognitive, depression, and falls Referrals and appointments  In addition, I have reviewed and discussed with patient certain preventive protocols, quality metrics, and best practice recommendations. A written personalized care plan for preventive services as well as general preventive health recommendations were provided to patient.     Verma Gobble, NP   11/30/2023

## 2023-12-30 ENCOUNTER — Telehealth (HOSPITAL_COMMUNITY): Admitting: Family

## 2023-12-30 DIAGNOSIS — G479 Sleep disorder, unspecified: Secondary | ICD-10-CM | POA: Diagnosis not present

## 2023-12-30 DIAGNOSIS — G4700 Insomnia, unspecified: Secondary | ICD-10-CM

## 2023-12-30 MED ORDER — QUETIAPINE FUMARATE 200 MG PO TABS: 200.0000 mg | ORAL_TABLET | Freq: Every day | ORAL | 2 refills | Status: DC

## 2023-12-30 NOTE — Progress Notes (Signed)
 [facility] MD/PA/NP OP Progress Note  12/30/2023 10:22 AM Miranda [name]  MRN:  [id number]  Chief Complaint: Medication management follow-up appointment  HPI: Miranda [name] 58 year old female presents for medication management follow-up appointment.  Current diagnoses with major depressive disorder and sleep disturbance.  Currently she is prescribed Wellbutrin  and Seroquel .  Initially discussed taking 50 to 100 mg at night for sleep disturbance.  States she has been taking Seroquel  200 mg which has been putting her to sleep.  States she has been taking Seroquel  200 for over 1 month.  Reports she recently had an echocardiogram and an EKG completed by her primary care provider.  Chart reviewed unable to locate EKG results.   Miranda [name] is to continue Wellbutrin  150 mg daily.  Continue Seroquel  200 mg nightly.  Follow-up 3 months we will place order for updated EKG for QT prolongation-baseline.  She remained calm throughout this assessment.  She answered all questions appropriately.  Does not appear to be responding to internal or external stimuli.  Or experiencing any delusional thought content.  Rating her depression 3 out of 10 with 10 being the worst.  Reports a good appetite.  States she is resting well throughout the night.   Visit Diagnosis:    ICD-10-CM   1. Insomnia, unspecified type  G47.00     2. Sleep disturbance  G47.9       Past Psychiatric History:   Past Medical History:  Past Medical History:  Diagnosis Date   Anemia    Anxiety    At risk for sudden cardiac death 23-Nov-2012   CHF - Combined Systolic + Diastolic Dysfunction. EF of 15-20% with Grade II diastolic dysfunction on echo 10/27/12 10/28/2012   CKD (chronic kidney disease)    Coronary artery disease    Depression    .   Dyspnea    none per pt on 04/23/22   Fibroids    Heart murmur    History of blood transfusion 2014   Hypertension    Hypothyroidism    S/P cardiac catheterization, 11-23-12, normal coronaries with  minimal luminal irregularities in RCA system 10/30/2012   Thyroid  disease    hypothyroidism    Past Surgical History:  Procedure Laterality Date   AORTIC VALVE REPLACEMENT N/A 03/12/2017   Procedure: AORTIC VALVE REPLACEMENT (AVR) with Mitral Annuloplasty;  Surgeon: Bartley Lightning, MD;  Location: [facility] OR;  Service: Open Heart Surgery;  Laterality: N/A;   EXTERNAL FIXATION LEG Right 04/24/2022   Procedure: EXTERNAL FIXATION RIGHT LEG;  Surgeon: Ali Ink, MD;  Location: [facility] OR;  Service: Orthopedics;  Laterality: Right;   EXTERNAL FIXATION REMOVAL Right 05/07/2022   Procedure: REMOVAL EXTERNAL FIXATION LEG right;  Surgeon: Ali Ink, MD;  Location: [facility] OR;  Service: Orthopedics;  Laterality: Right;  120 min   HARDWARE REMOVAL Right 11/26/2022   Procedure: Right ankle removal of hardware (syndesmotic screws x2);  Surgeon: Ali Ink, MD;  Location: [facility];  Service: Orthopedics;  Laterality: Right;  60   LEFT AND RIGHT HEART CATHETERIZATION WITH CORONARY ANGIOGRAM N/A 2012-11-23   Procedure: LEFT AND RIGHT HEART CATHETERIZATION WITH CORONARY ANGIOGRAM;  Surgeon: Arleen Lacer, MD;  Location: [facility] CATH LAB;  Service: Cardiovascular;  Laterality: N/A;   MITRAL VALVE REPAIR N/A 03/12/2017   Procedure: MITRAL VALVE REPAIR (MVR);  Surgeon: Bartley Lightning, MD;  Location: [facility] OR;  Service: Open Heart Surgery;  Laterality: N/A;  mitral annuloplasty   MULTIPLE EXTRACTIONS WITH ALVEOLOPLASTY N/A 01/21/2017   Procedure: Extraction  of tooth #'s 4-13 with alveoloplasty;  Surgeon: Carol Chroman, DDS;  Location: [facility] OR;  Service: Oral Surgery;  Laterality: N/A;   ORIF ANKLE FRACTURE Right 05/07/2022   Procedure: OPEN REDUCTION INTERNAL FIXATION (ORIF)right  ANKLE FRACTURE possible syndesmosis and/or deltoid fixation, possible allograft;  Surgeon: Ali Ink, MD;  Location: [facility] OR;  Service: Orthopedics;  Laterality: Right;  120 min   RIGHT/LEFT HEART CATH AND CORONARY  ANGIOGRAPHY N/A 01/09/2017   Procedure: Right/Left Heart Cath and Coronary Angiography;  Surgeon: Millicent Ally, MD;  Location: [facility] INVASIVE CV LAB;  Service: Cardiovascular;  Laterality: N/A;   TEE WITHOUT CARDIOVERSION N/A 03/12/2017   Procedure: TRANSESOPHAGEAL ECHOCARDIOGRAM (TEE);  Surgeon: Bartley Lightning, MD;  Location: [facility] OR;  Service: Open Heart Surgery;  Laterality: N/A;    Family Psychiatric History:   Family History:  Family History  Problem Relation Age of Onset   Stroke Mother    Hypertension Mother    Liver disease Mother        autoimmune   Heart disease Mother    Colon polyps Mother    Stroke Father    Hypertension Father    Hypertension Maternal Grandmother    Heart disease Maternal Grandmother    Bone cancer Paternal Grandfather    Bone cancer Maternal Uncle    Arthritis Paternal Uncle    Breast cancer Cousin     Social History:  Social History   Socioeconomic History   Marital status: Single    Spouse name: Not on file   Number of children: 1   Years of education: 12th   Highest education level: Not on file  Occupational History   Occupation: unemployed    Employer: [employer]  Tobacco Use   Smoking status: Former    Current packs/day: 0.00    Average packs/day: 0.5 packs/day for 5.0 years (2.5 ttl pk-yrs)    Types: Cigars, Cigarettes    Start date: 08/04/1973    Quit date: 08/04/1978    Years since quitting: 45.4   Smokeless tobacco: Never   Tobacco comments:    1 cigar daily-quit 10/24/12    Vaping Use   Vaping status: Never Used  Substance and Sexual Activity   Alcohol use: Not Currently    Comment: 2 drinks a week   Drug use: Not Currently    Frequency: 5.0 times per week    Types: Marijuana    Comment: none since 2015   Sexual activity: Not Currently    Birth control/protection: Post-menopausal  Other Topics Concern   Not on file  Social History Narrative   Patient lives at home alone. (Condo)   Caffeine use: 1/2 soda daily    Diet   Martial status:single   Is it one or more stories? Yes   How many persons live in your home? 1   Do you have any pets in your home? No   Current or past profession: Company secretary   Do you exercise? No   Do you have a living will? No    Do you have a DNR form? No    Do you have signed POA/HPOA forms? No    Social Drivers of Corporate investment banker Strain: Patient Declined (10/16/2023)   Overall Financial Resource Strain (CARDIA)    Difficulty of Paying Living Expenses: Patient declined  Food Insecurity: Patient Declined (10/16/2023)   Hunger Vital Sign    Worried About Running Out of Food in the Last Year: Patient declined  Ran Out of Food in the Last Year: Patient declined  Transportation Needs: Patient Declined (10/16/2023)   PRAPARE - Administrator, Civil Service (Medical): Patient declined    Lack of Transportation (Non-Medical): Patient declined  Physical Activity: Insufficiently Active (10/16/2023)   Exercise Vital Sign    Days of Exercise per Week: 3 days    Minutes of Exercise per Session: 20 min  Stress: Stress Concern Present (10/16/2023)   [employer] of Occupational Health - Occupational Stress Questionnaire    Feeling of Stress : To some extent  Social Connections: Unknown (10/16/2023)   Social Connection and Isolation Panel [NHANES]    Frequency of Communication with Friends and Family: More than three times a week    Frequency of Social Gatherings with Friends and Family: Patient declined    Attends Religious Services: Patient declined    Database administrator or Organizations: No    Attends Engineer, structural: Not on file    Marital Status: Never married    Allergies:  Allergies  Allergen Reactions   Codeine Hives   Rexulti  [Brexpiprazole ]     Stomach cramps   Wellbutrin  [Bupropion ]     Excessive sweating     Metabolic Disorder Labs: Lab Results  Component Value Date   HGBA1C 5.4 06/08/2019   MPG 108  06/08/2019   MPG 128 03/10/2017   No results found for: "PROLACTIN" Lab Results  Component Value Date   CHOL 129 04/20/2023   TRIG 75 04/20/2023   HDL 44 (L) 04/20/2023   CHOLHDL 2.9 04/20/2023   VLDL 11 09/26/2016   LDLCALC 69 04/20/2023   LDLCALC 78 05/19/2022   Lab Results  Component Value Date   TSH 1.68 10/19/2023   TSH 3.35 09/19/2022    Therapeutic Level Labs: No results found for: "LITHIUM" No results found for: "VALPROATE" No results found for: "CBMZ"  Current Medications: Current Outpatient Medications  Medication Sig Dispense Refill   acetaminophen  (TYLENOL ) 500 MG tablet Take 1,000 mg by mouth every 6 (six) hours as needed for mild pain.     aspirin  EC 81 MG EC tablet Take 1 tablet (81 mg total) by mouth daily.     buPROPion  (WELLBUTRIN  XL) 150 MG 24 hr tablet Take 1 tablet by mouth once daily 90 tablet 0   carvedilol  (COREG ) 12.5 MG tablet Take 1 tablet by mouth twice daily 180 tablet 3   Cholecalciferol (D3) 50 MCG (2000 UT) TABS Take 2,000 Units by mouth daily.     ferrous sulfate  325 (65 FE) MG EC tablet Take 1 tablet (325 mg total) by mouth 2 (two) times daily. 180 tablet 3   fluticasone  (FLONASE ) 50 MCG/ACT nasal spray USE 1 SPRAY(S) IN EACH NOSTRIL TWICE DAILY AS NEEDED FOR ALLERGIES OR  RHINITIS 16 g 5   furosemide  (LASIX ) 40 MG tablet Take 1 tablet by mouth once daily 90 tablet 3   levothyroxine  (SYNTHROID ) 100 MCG tablet TAKE 1 TABLET BY MOUTH ONCE DAILY BEFORE BREAKFAST 90 tablet 0   lisinopril  (ZESTRIL ) 2.5 MG tablet Take 1 tablet (2.5 mg total) by mouth daily. 90 tablet 3   QUEtiapine  (SEROQUEL ) 200 MG tablet Take 1 tablet (200 mg total) by mouth at bedtime. 90 tablet 2   rosuvastatin  (CRESTOR ) 40 MG tablet Take 1 tablet by mouth once daily 90 tablet 0   spironolactone  (ALDACTONE ) 25 MG tablet Take 1/2 (one-half) tablet by mouth once daily 45 tablet 3   No current facility-administered medications for  this visit.     Musculoskeletal: Virtual  assessment  Psychiatric Specialty Exam: Review of Systems  Psychiatric/Behavioral:  Positive for sleep disturbance. Negative for agitation and behavioral problems. The patient is nervous/anxious.   All other systems reviewed and are negative.   Last menstrual period 01/17/2015.There is no height or weight on file to calculate BMI.  General Appearance: Casual  Eye Contact:  Good  Speech:  Clear and Coherent  Volume:  Normal  Mood:  Euthymic  Affect:  Congruent  Thought Process:  Coherent  Orientation:  Full (Time, Place, and Person)  Thought Content: Logical   Suicidal Thoughts:  No  Homicidal Thoughts:  No  Memory:  Immediate;   Fair Recent;   Fair  Judgement:  Good  Insight:  Good  Psychomotor Activity:  Normal  Concentration:  Concentration: Good  Recall:  Good  Fund of Knowledge: Good  Language: Fair  Akathisia:  No  Handed:  Right  AIMS (if indicated): not done  Assets:  Communication Skills Desire for Improvement  ADL's:  Intact  Cognition: WNL  Sleep:  Good reported has improved with medication   Screenings: AUDIT    Flowsheet Row Office Visit from 09/27/2018 in [facility] Office Visit from 05/24/2018 in [facility]  Alcohol Use Disorder Identification Test Final Score (AUDIT) 4 3      Mini-Mental    Flowsheet Row Office Visit from 11/28/2022 in [facility]  Total Score (max 30 points ) 30      PHQ2-9    Flowsheet Row Office Visit from 11/30/2023 in [facility] Office Visit from 06/29/2023 in [facility] Office Visit from 04/20/2023 in [facility] Office Visit from 11/28/2022 in [facility] Clinical Support from 11/21/2021 in [facility] & [facility] Medicine  PHQ-2 Total Score 0 3  0 0 3  PHQ-9 Total Score -- 6 -- -- 8      Flowsheet Row Admission (Discharged) from 05/07/2022 in [place] [facility] Admission (Discharged) from 04/24/2022 in [place] [facility]  C-SSRS RISK CATEGORY No Risk No Risk        Assessment and Plan: Miranda [name] 58 year old African-American female presents for medication management follow-up appointment.  Currently prescribed Wellbutrin  and Seroquel  for mood stabilization.  She denied concerns with sleep disturbance anxiety or depression at this visit.  She reports she has been taking and tolerating medications well.  States she recently followed up with cardiology and had a good progress report with echocardiogram and EKG.  Unable to locate EKG and imaging.  Patient to follow-up 3 months for medication management  Collaboration of Care: Collaboration of Care: Medication Management AEB continue Wellbutrin  150 mg and Seroquel  200 mg nightly  Patient/Guardian was advised Release of Information must be obtained prior to any record release in order to collaborate their care with an outside provider. Patient/Guardian was advised if they have not already done so to contact the registration department to sign all necessary forms in order for us  to release information regarding their care.   Consent: Patient/Guardian gives verbal consent for treatment and assignment of benefits for services provided during this visit. Patient/Guardian expressed understanding and agreed to proceed.    Levester Reagin, NP 12/30/2023, 10:22 AM

## 2023-12-31 ENCOUNTER — Other Ambulatory Visit: Payer: Self-pay | Admitting: Cardiology

## 2024-01-07 NOTE — Progress Notes (Signed)
 Virtual Visit via Video Note  I connected with Miranda [name] on 12/30/23 at  9:30 AM EDT by a video enabled telemedicine application and verified that I am speaking with the correct person using two identifiers.  Location: Patient: home Provider: Office   I discussed the limitations of evaluation and management by telemedicine and the availability of in person appointments. The patient expressed understanding and agreed to proceed.   I discussed the assessment and treatment plan with the patient. The patient was provided an opportunity to ask questions and all were answered. The patient agreed with the plan and demonstrated an understanding of the instructions.   The patient was advised to call back or seek an in-person evaluation if the symptoms worsen or if the condition fails to improve as anticipated.  I provided 20 minutes of non-face-to-face time during this encounter.   Levester Reagin, NP   [facility] MD/PA/NP OP Progress Note  01/07/2024 8:47 AM Verenise Moulin  MRN:  [id number]  Chief Complaint: Medication management follow-up appointment  HPI: Miranda [name] 58 year old female presents for medication management follow-up appointment.  Current diagnoses with major depressive disorder and sleep disturbance.  Currently she is prescribed Wellbutrin  and Seroquel .  Initially discussed taking 50 to 100 mg at night for sleep disturbance.  States she has been taking Seroquel  200 mg which has been putting her to sleep.  States she has been taking Seroquel  200 for over 1 month.  Reports she recently had an echocardiogram and an EKG completed by her primary care provider.  Chart reviewed unable to locate EKG results.   Wanell is to continue Wellbutrin  150 mg daily.  Continue Seroquel  200 mg nightly.  Follow-up 3 months we will place order for updated EKG for QT prolongation-baseline.  She remained calm throughout this assessment.  She answered all questions appropriately.  Does not appear to  be responding to internal or external stimuli.  Or experiencing any delusional thought content.  Rating her depression 3 out of 10 with 10 being the worst.  Reports a good appetite.  States she is resting well throughout the night.   Visit Diagnosis:    ICD-10-CM   1. Insomnia, unspecified type  G47.00     2. Sleep disturbance  G47.9       Past Psychiatric History:   Past Medical History:  Past Medical History:  Diagnosis Date   Anemia    Anxiety    At risk for sudden cardiac death 11/23/2012   CHF - Combined Systolic + Diastolic Dysfunction. EF of 15-20% with Grade II diastolic dysfunction on echo 10/27/12 10/28/2012   CKD (chronic kidney disease)    Coronary artery disease    Depression    .   Dyspnea    none per pt on 04/23/22   Fibroids    Heart murmur    History of blood transfusion 2014   Hypertension    Hypothyroidism    S/P cardiac catheterization, November 23, 2012, normal coronaries with minimal luminal irregularities in RCA system 10/30/2012   Thyroid  disease    hypothyroidism    Past Surgical History:  Procedure Laterality Date   AORTIC VALVE REPLACEMENT N/A 03/12/2017   Procedure: AORTIC VALVE REPLACEMENT (AVR) with Mitral Annuloplasty;  Surgeon: Bartley Lightning, MD;  Location: [facility] OR;  Service: Open Heart Surgery;  Laterality: N/A;   EXTERNAL FIXATION LEG Right 04/24/2022   Procedure: EXTERNAL FIXATION RIGHT LEG;  Surgeon: Ali Ink, MD;  Location: [facility] OR;  Service: Orthopedics;  Laterality: Right;  EXTERNAL FIXATION REMOVAL Right 05/07/2022   Procedure: REMOVAL EXTERNAL FIXATION LEG right;  Surgeon: Ali Ink, MD;  Location: [facility] OR;  Service: Orthopedics;  Laterality: Right;  120 min   HARDWARE REMOVAL Right 11/26/2022   Procedure: Right ankle removal of hardware (syndesmotic screws x2);  Surgeon: Ali Ink, MD;  Location: [facility];  Service: Orthopedics;  Laterality: Right;  60   LEFT AND RIGHT HEART CATHETERIZATION WITH CORONARY  ANGIOGRAM N/A 10/29/2012   Procedure: LEFT AND RIGHT HEART CATHETERIZATION WITH CORONARY ANGIOGRAM;  Surgeon: Arleen Lacer, MD;  Location: [facility] CATH LAB;  Service: Cardiovascular;  Laterality: N/A;   MITRAL VALVE REPAIR N/A 03/12/2017   Procedure: MITRAL VALVE REPAIR (MVR);  Surgeon: Bartley Lightning, MD;  Location: [facility] OR;  Service: Open Heart Surgery;  Laterality: N/A;  mitral annuloplasty   MULTIPLE EXTRACTIONS WITH ALVEOLOPLASTY N/A 01/21/2017   Procedure: Extraction of tooth #'s 4-13 with alveoloplasty;  Surgeon: Carol Chroman, DDS;  Location: [facility] OR;  Service: Oral Surgery;  Laterality: N/A;   ORIF ANKLE FRACTURE Right 05/07/2022   Procedure: OPEN REDUCTION INTERNAL FIXATION (ORIF)right  ANKLE FRACTURE possible syndesmosis and/or deltoid fixation, possible allograft;  Surgeon: Ali Ink, MD;  Location: [facility] OR;  Service: Orthopedics;  Laterality: Right;  120 min   RIGHT/LEFT HEART CATH AND CORONARY ANGIOGRAPHY N/A 01/09/2017   Procedure: Right/Left Heart Cath and Coronary Angiography;  Surgeon: Millicent Ally, MD;  Location: [facility] INVASIVE CV LAB;  Service: Cardiovascular;  Laterality: N/A;   TEE WITHOUT CARDIOVERSION N/A 03/12/2017   Procedure: TRANSESOPHAGEAL ECHOCARDIOGRAM (TEE);  Surgeon: Bartley Lightning, MD;  Location: [facility] OR;  Service: Open Heart Surgery;  Laterality: N/A;    Family Psychiatric History:   Family History:  Family History  Problem Relation Age of Onset   Stroke Mother    Hypertension Mother    Liver disease Mother        autoimmune   Heart disease Mother    Colon polyps Mother    Stroke Father    Hypertension Father    Hypertension Maternal Grandmother    Heart disease Maternal Grandmother    Bone cancer Paternal Grandfather    Bone cancer Maternal Uncle    Arthritis Paternal Uncle    Breast cancer Cousin     Social History:  Social History   Socioeconomic History   Marital status: Single    Spouse name: Not on file   Number of children: 1   Years of  education: 12th   Highest education level: Not on file  Occupational History   Occupation: unemployed    Employer: [employer]  Tobacco Use   Smoking status: Former    Current packs/day: 0.00    Average packs/day: 0.5 packs/day for 5.0 years (2.5 ttl pk-yrs)    Types: Cigars, Cigarettes    Start date: 08/04/1973    Quit date: 08/04/1978    Years since quitting: 45.4   Smokeless tobacco: Never   Tobacco comments:    1 cigar daily-quit 10/24/12    Vaping Use   Vaping status: Never Used  Substance and Sexual Activity   Alcohol use: Not Currently    Comment: 2 drinks a week   Drug use: Not Currently    Frequency: 5.0 times per week    Types: Marijuana    Comment: none since 2015   Sexual activity: Not Currently    Birth control/protection: Post-menopausal  Other Topics Concern   Not on file  Social History Narrative  Patient lives at home alone. (Condo)   Caffeine use: 1/2 soda daily   Diet   Martial status:single   Is it one or more stories? Yes   How many persons live in your home? 1   Do you have any pets in your home? No   Current or past profession: Company secretary   Do you exercise? No   Do you have a living will? No    Do you have a DNR form? No    Do you have signed POA/HPOA forms? No    Social Drivers of Corporate investment banker Strain: Patient Declined (10/16/2023)   Overall Financial Resource Strain (CARDIA)    Difficulty of Paying Living Expenses: Patient declined  Food Insecurity: Patient Declined (10/16/2023)   Hunger Vital Sign    Worried About Running Out of Food in the Last Year: Patient declined    Ran Out of Food in the Last Year: Patient declined  Transportation Needs: Patient Declined (10/16/2023)   PRAPARE - Administrator, Civil Service (Medical): Patient declined    Lack of Transportation (Non-Medical): Patient declined  Physical Activity: Insufficiently Active (10/16/2023)   Exercise Vital Sign    Days of Exercise per Week:  3 days    Minutes of Exercise per Session: 20 min  Stress: Stress Concern Present (10/16/2023)   [employer] of Occupational Health - Occupational Stress Questionnaire    Feeling of Stress : To some extent  Social Connections: Unknown (10/16/2023)   Social Connection and Isolation Panel [NHANES]    Frequency of Communication with Friends and Family: More than three times a week    Frequency of Social Gatherings with Friends and Family: Patient declined    Attends Religious Services: Patient declined    Database administrator or Organizations: No    Attends Engineer, structural: Not on file    Marital Status: Never married    Allergies:  Allergies  Allergen Reactions   Codeine Hives   Rexulti  [Brexpiprazole ]     Stomach cramps   Wellbutrin  [Bupropion ]     Excessive sweating     Metabolic Disorder Labs: Lab Results  Component Value Date   HGBA1C 5.4 06/08/2019   MPG 108 06/08/2019   MPG 128 03/10/2017   No results found for: "PROLACTIN" Lab Results  Component Value Date   CHOL 129 04/20/2023   TRIG 75 04/20/2023   HDL 44 (L) 04/20/2023   CHOLHDL 2.9 04/20/2023   VLDL 11 09/26/2016   LDLCALC 69 04/20/2023   LDLCALC 78 05/19/2022   Lab Results  Component Value Date   TSH 1.68 10/19/2023   TSH 3.35 09/19/2022    Therapeutic Level Labs: No results found for: "LITHIUM" No results found for: "VALPROATE" No results found for: "CBMZ"  Current Medications: Current Outpatient Medications  Medication Sig Dispense Refill   acetaminophen  (TYLENOL ) 500 MG tablet Take 1,000 mg by mouth every 6 (six) hours as needed for mild pain.     aspirin  EC 81 MG EC tablet Take 1 tablet (81 mg total) by mouth daily.     buPROPion  (WELLBUTRIN  XL) 150 MG 24 hr tablet Take 1 tablet by mouth once daily 90 tablet 0   carvedilol  (COREG ) 12.5 MG tablet Take 1 tablet by mouth twice daily 180 tablet 3   Cholecalciferol (D3) 50 MCG (2000 UT) TABS Take 2,000 Units by mouth daily.      ferrous sulfate  325 (65 FE) MG EC tablet Take 1 tablet (  325 mg total) by mouth 2 (two) times daily. 180 tablet 3   fluticasone  (FLONASE ) 50 MCG/ACT nasal spray USE 1 SPRAY(S) IN EACH NOSTRIL TWICE DAILY AS NEEDED FOR ALLERGIES OR  RHINITIS 16 g 5   furosemide  (LASIX ) 40 MG tablet Take 1 tablet by mouth once daily 90 tablet 3   levothyroxine  (SYNTHROID ) 100 MCG tablet TAKE 1 TABLET BY MOUTH ONCE DAILY BEFORE BREAKFAST 90 tablet 0   lisinopril  (ZESTRIL ) 2.5 MG tablet Take 1 tablet (2.5 mg total) by mouth daily. 90 tablet 3   QUEtiapine  (SEROQUEL ) 200 MG tablet Take 1 tablet (200 mg total) by mouth at bedtime. 90 tablet 2   rosuvastatin  (CRESTOR ) 40 MG tablet Take 1 tablet by mouth once daily 90 tablet 0   spironolactone  (ALDACTONE ) 25 MG tablet Take 1/2 (one-half) tablet by mouth once daily 45 tablet 3   No current facility-administered medications for this visit.     Musculoskeletal: Virtual assessment  Psychiatric Specialty Exam: Review of Systems  Psychiatric/Behavioral:  Positive for sleep disturbance. Negative for agitation and behavioral problems. The patient is nervous/anxious.   All other systems reviewed and are negative.   Last menstrual period 01/17/2015.There is no height or weight on file to calculate BMI.  General Appearance: Casual  Eye Contact:  Good  Speech:  Clear and Coherent  Volume:  Normal  Mood:  Euthymic  Affect:  Congruent  Thought Process:  Coherent  Orientation:  Full (Time, Place, and Person)  Thought Content: Logical   Suicidal Thoughts:  No  Homicidal Thoughts:  No  Memory:  Immediate;   Fair Recent;   Fair  Judgement:  Good  Insight:  Good  Psychomotor Activity:  Normal  Concentration:  Concentration: Good  Recall:  Good  Fund of Knowledge: Good  Language: Fair  Akathisia:  No  Handed:  Right  AIMS (if indicated): not done  Assets:  Communication Skills Desire for Improvement  ADL's:  Intact  Cognition: WNL  Sleep:  Good reported has  improved with medication   Screenings: AUDIT    Flowsheet Row Office Visit from 09/27/2018 in [facility] Adult Medicine Office Visit from 05/24/2018 in [facility]  Alcohol Use Disorder Identification Test Final Score (AUDIT) 4 3      Mini-Mental    Flowsheet Row Office Visit from 11/28/2022 in [facility]  Total Score (max 30 points ) 30      PHQ2-9    Flowsheet Row Office Visit from 11/30/2023 in [facility] Office Visit from 06/29/2023 in [facility] Office Visit from 04/20/2023 in [facility] Office Visit from 11/28/2022 in [facility] Clinical Support from 11/21/2021 in [facility] & Adult Medicine  PHQ-2 Total Score 0 3 0 0 3  PHQ-9 Total Score -- 6 -- -- 8      Flowsheet Row Admission (Discharged) from 05/07/2022 in [place] [facility] Admission (Discharged) from 04/24/2022 in [place] [facility]  C-SSRS RISK CATEGORY No Risk No Risk        Assessment and Plan: Miranda [name] 58 year old African-American female presents for medication management follow-up appointment.  Currently prescribed Wellbutrin  and Seroquel  for mood stabilization.  She denied concerns with sleep disturbance anxiety or depression at this visit.  She reports she has been taking  and tolerating medications well.  States she recently followed up with cardiology and had a good progress report with echocardiogram and EKG.  Unable to locate EKG and imaging.  Patient to follow-up 3 months for medication management  Collaboration of Care: Collaboration of Care: Medication Management AEB continue Wellbutrin  150 mg and Seroquel  200 mg nightly  Patient/Guardian was advised Release of Information must be obtained prior to any record  release in order to collaborate their care with an outside provider. Patient/Guardian was advised if they have not already done so to contact the registration department to sign all necessary forms in order for us  to release information regarding their care.   Consent: Patient/Guardian gives verbal consent for treatment and assignment of benefits for services provided during this visit. Patient/Guardian expressed understanding and agreed to proceed.    Levester Reagin, NP 12/30/2023, 8:47 AM

## 2024-02-01 ENCOUNTER — Telehealth (HOSPITAL_COMMUNITY): Admitting: Family

## 2024-02-08 NOTE — Progress Notes (Signed)
 Cardiology Office Note:  .   Date:  02/15/2024  ID:  Miranda [name], DOB 1965/12/18, MRN [id number] PCP: Miranda [name], Miranda [name]  [facility] Providers Cardiologist:  Miranda Parchment, MD    History of Present Illness: .   [name] is a 58 y.o. female  with a past medical history of hypertension, AVR repair and MVR repair in 2018 with a 23 mm [facility] Ease pericardial valve and mitral valve angioplasty with 28 mm Sorin 3D mammo, NICM with prior EF 35% and now up to 65%, hypertension, hypothyroidism, dilated aortic root (40 mm), and anxiety   11/24/2022 for surgical clearance. During that visit her blood pressure was significantly elevated and patient required clonidine  in the office for reduction. She had clonidine  0.1 mg added to her current regimen along with carvedilol  increased to 12.5 mg twice daily. Documented blood pressures for a week with some improvement and then cleared for procedure which was successful. On 6/14 PCP increased lisinopril  to 10 mg daily.  Patient seen 10/2023 atypical chest pain. Echo repeated and results below.   Patient here for f/u. Denies cardiac complaints. No chest pain, dyspnea, palpitations, edema. Occasional dizziness when she bends over and stands up quickly. No dizziness with transitioning from bed or chair. She walks 15-20 min 3x a week.   ROS:    Studies Reviewed: SABRA         Prior CV Studies:    Echo 10/22/23 IMPRESSIONS     1. Left ventricular ejection fraction, by estimation, is 60 to 65%. The  left ventricle has normal function. The left ventricle has no regional  wall motion abnormalities. Elevated left ventricular end-diastolic  pressure.   2. Right ventricular systolic function is normal. The right ventricular  size is normal. There is normal pulmonary artery systolic pressure.   3. Left atrial size was severely dilated.   4. Mitral valve mean gradient 4.5 mmHg compared with 4 mmHg 05/2020. The  mitral valve has been  repaired/replaced. Mild mitral valve regurgitation.  No evidence of mitral stenosis. The mean mitral valve gradient is 4.5 mmHg  with average heart rate of 76  bpm. There is a 28 mm Sorin 3D mammo prosthetic annuloplasty ring present  in the mitral position. Procedure Date: 2018.   5. The aortic valve has been repaired/replaced. Aortic valve  regurgitation is not visualized. No aortic stenosis is present. There is a  23 mm Edwards Magna pericardial valve present in the aortic position.  Procedure Date: 2018.   6. The inferior vena cava is normal in size with greater than 50%  respiratory variability, suggesting right atrial pressure of 3 mmHg.    Echo 05/14/20 IMPRESSIONS     1. Left ventricular ejection fraction, by estimation, is 60 to 65%. The  left ventricle has normal function. The left ventricle has no regional  wall motion abnormalities. There is moderate left ventricular hypertrophy.  Left ventricular diastolic  parameters are consistent with Grade II diastolic dysfunction  (pseudonormalization). Elevated left atrial pressure.   2. Right ventricular systolic function is mildly reduced. The right  ventricular size is normal. There is normal pulmonary artery systolic  pressure. The estimated right ventricular systolic pressure is 29.8 mmHg.   3. Left atrial size was severely dilated.   4. The mitral valve has been repaired/replaced. There is a 28 mm  prosthetic annuloplasty ring present in the mitral position. Mild to  moderate mitral valve regurgitation. Mild mitral stenosis. MG 4 mmHg at HR  62 bpm, MVA 1.9 cm^2 by continuity equation.   5. The aortic valve has been repaired/replaced. There is a 23 mm  bioprosthetic valve present in the aortic position. Aortic valve  regurgitation is not visualized. Echo findings are consistent with normal  structure and function of the aortic valve  prosthesis. MG 9 mmHg, stable from prior echo on 10/19/17.   6. Aortic dilatation noted.  There is mild dilatation of the aortic root,  measuring 42 mm. There is mild dilatation of the ascending aorta,  measuring 40 mm.   7. The inferior vena cava is normal in size with greater than 50%  respiratory variability, suggesting right atrial pressure of 3 mmHg.   FINDINGS   Left Ventricle: Left ventricular ejection fraction, by estimation, is 60  to 65%. The left ventricle has normal function. The left ventricle has no  regional wall motion abnormalities. The left ventricular internal cavity  size was normal in size. There is   moderate left ventricular hypertrophy. Left ventricular diastolic  parameters are consistent with Grade II diastolic dysfunction  (pseudonormalization). Elevated left atrial pressure.   Right Ventricle: The right ventricular size is normal. Right vetricular  wall thickness was not assessed. Right ventricular systolic function is  mildly reduced. There is normal pulmonary artery systolic pressure. The  tricuspid regurgitant velocity is 2.59   m/s, and with an assumed right atrial pressure of 3 mmHg, the estimated  right ventricular systolic pressure is 29.8 mmHg.   Left Atrium: Left atrial size was severely dilated.   Right Atrium: Right atrial size was normal in size.   Pericardium: There is no evidence of pericardial effusion.   Mitral Valve: The mitral valve has been repaired/replaced. Mild to  moderate mitral valve regurgitation. There is a 28 mm prosthetic  annuloplasty ring present in the mitral position. Mild mitral valve  stenosis. MV peak gradient, 11.4 mmHg. The mean mitral   valve gradient is 4.0 mmHg.   Tricuspid Valve: The tricuspid valve is normal in structure. Tricuspid  valve regurgitation is trivial.   Aortic Valve: The aortic valve has been repaired/replaced. Aortic valve  regurgitation is not visualized. Aortic valve mean gradient measures 9.0  mmHg. Aortic valve peak gradient measures 16.5 mmHg. Aortic valve area, by  VTI  measures 2.07 cm. There is a  23 mm bioprosthetic valve present in the aortic position. Echo findings  are consistent with normal structure and function of the aortic valve  prosthesis.   Pulmonic Valve: The pulmonic valve was normal in structure. Pulmonic valve  regurgitation is trivial.   Aorta: Aortic dilatation noted. There is mild dilatation of the aortic  root, measuring 42 mm. There is mild dilatation of the ascending aorta,  measuring 40 mm.   Venous: The inferior vena cava is normal in size with greater than 50%  respiratory variability, suggesting right atrial pressure of 3 mmHg.   IAS/Shunts: No atrial level shunt detected by color flow Doppler.     Risk Assessment/Calculations:             Physical Exam:   VS:  BP 130/80   Pulse 66   Ht 5' 5 (1.651 m)   Wt 153 lb (69.4 kg)   LMP 01/17/2015   SpO2 95%   BMI 25.46 kg/m    Orhtostatics: No data found. Wt Readings from Last 3 Encounters:  02/15/24 153 lb (69.4 kg)  11/30/23 148 lb 12.8 oz (67.5 kg)  10/21/23 146 lb 9.6 oz (66.5 kg)  GEN: Well nourished, well developed in no acute distress NECK: No JVD; No carotid bruits CARDIAC: RRR, no murmurs, rubs, gallops RESPIRATORY:  Clear to auscultation without rales, wheezing or rhonchi  ABDOMEN: Soft, non-tender, non-distended EXTREMITIES:  No edema; No deformity   ASSESSMENT AND PLAN: .      Aortic Valve Replacement and Mitral Valve Repair Echo 10/2023 stable see above, no significant murmur on exam.  NICM with prior EF 35% and now up to 65%,   Hypertension Managed with lisinopril , carvedilol , spironolactone , and furosemide .  -check labs today -2 gm sodium diet -150 min exercise weekly   Hyperlipidemia Lipid panel shows LDL at 69 mg/dL, 0/7975 - Continue current management regimen. -repeat FLP today          Dispo: f/u in 1 yr  Signed, Olivia Pavy, PA-C

## 2024-02-15 ENCOUNTER — Ambulatory Visit: Attending: Physician Assistant | Admitting: Physician Assistant

## 2024-02-15 ENCOUNTER — Encounter: Payer: Self-pay | Admitting: Physician Assistant

## 2024-02-15 VITALS — BP 130/80 | HR 66 | Ht 65.0 in | Wt 153.0 lb

## 2024-02-15 DIAGNOSIS — I10 Essential (primary) hypertension: Secondary | ICD-10-CM | POA: Diagnosis not present

## 2024-02-15 DIAGNOSIS — Z952 Presence of prosthetic heart valve: Secondary | ICD-10-CM

## 2024-02-15 DIAGNOSIS — E7849 Other hyperlipidemia: Secondary | ICD-10-CM

## 2024-02-15 DIAGNOSIS — I428 Other cardiomyopathies: Secondary | ICD-10-CM

## 2024-02-15 LAB — CBC

## 2024-02-15 LAB — LIPID PANEL

## 2024-02-15 NOTE — Patient Instructions (Signed)
 Medication Instructions:  Your physician recommends that you continue on your current medications as directed. Please refer to the Current Medication list given to you today.  *If you need a refill on your cardiac medications before your next appointment, please call your pharmacy*  Lab Work: TODAY:  CMET, CBC, & LIPID  If you have labs (blood work) drawn today and your tests are completely normal, you will receive your results only by: MyChart Message (if you have MyChart) OR A paper copy in the mail If you have any lab test that is abnormal or we need to change your treatment, we will call you to review the results.  Testing/Procedures: None ordered  Follow-Up: At [facility], you and your health needs are our priority.  As part of our continuing mission to provide you with exceptional heart care, our providers are all part of one team.  This team includes your primary Cardiologist (physician) and Advanced Practice Providers or APPs (Physician Assistants and Nurse Practitioners) who all work together to provide you with the care you need, when you need it.  Your next appointment:   1 year(s)  Provider:   Oneil Parchment, MD    We recommend signing up for the patient portal called MyChart.  Sign up information is provided on this After Visit Summary.  MyChart is used to connect with patients for Virtual Visits (Telemedicine).  Patients are able to view lab/test results, encounter notes, upcoming appointments, etc.  Non-urgent messages can be sent to your provider as well.   To learn more about what you can do with MyChart, go to [email or web address].   Other Instructions

## 2024-02-16 ENCOUNTER — Other Ambulatory Visit: Payer: Self-pay | Admitting: *Deleted

## 2024-02-16 ENCOUNTER — Ambulatory Visit: Payer: Self-pay | Admitting: Physician Assistant

## 2024-02-16 DIAGNOSIS — E782 Mixed hyperlipidemia: Secondary | ICD-10-CM

## 2024-02-16 DIAGNOSIS — Z79899 Other long term (current) drug therapy: Secondary | ICD-10-CM

## 2024-02-16 LAB — COMPREHENSIVE METABOLIC PANEL WITH GFR
ALT: 50 IU/L — ABNORMAL HIGH (ref 0–32)
AST: 66 IU/L — ABNORMAL HIGH (ref 0–40)
Albumin: 4.3 g/dL (ref 3.8–4.9)
Alkaline Phosphatase: 95 IU/L (ref 44–121)
BUN/Creatinine Ratio: 18 (ref 12–28)
BUN: 19 mg/dL (ref 8–27)
Bilirubin Total: 0.3 mg/dL (ref 0.0–1.2)
CO2: 17 mmol/L — ABNORMAL LOW (ref 20–29)
Calcium: 10.1 mg/dL (ref 8.7–10.3)
Chloride: 106 mmol/L (ref 96–106)
Creatinine, Ser: 1.07 mg/dL — ABNORMAL HIGH (ref 0.57–1.00)
Globulin, Total: 4.8 g/dL — ABNORMAL HIGH (ref 1.5–4.5)
Glucose: 77 mg/dL (ref 70–99)
Potassium: 4.3 mmol/L (ref 3.5–5.2)
Sodium: 138 mmol/L (ref 134–144)
Total Protein: 9.1 g/dL — ABNORMAL HIGH (ref 6.0–8.5)
eGFR: 59 mL/min/1.73 — ABNORMAL LOW (ref >59)

## 2024-02-16 LAB — LIPID PANEL
Cholesterol, Total: 125 mg/dL (ref 100–199)
HDL: 40 mg/dL (ref >39)
LDL CALC COMMENT:: 3.1 ratio (ref 0.0–4.4)
LDL Chol Calc (NIH): 68 mg/dL (ref 0–99)
Triglycerides: 89 mg/dL (ref 0–149)
VLDL Cholesterol Cal: 17 mg/dL (ref 5–40)

## 2024-02-16 LAB — CBC
Hematocrit: 39.2 (ref 34.0–46.6)
Hemoglobin: 12.1 g/dL (ref 11.1–15.9)
MCH: 27.6 pg (ref 26.6–33.0)
MCHC: 30.9 g/dL — AB (ref 31.5–35.7)
MCV: 90 fL (ref 79–97)
Platelets: 157 x10E3/uL (ref 150–450)
RBC: 4.38 x10E6/uL (ref 3.77–5.28)
RDW: 13.5 (ref 11.7–15.4)
WBC: 5.4 x10E3/uL (ref 3.4–10.8)

## 2024-02-16 MED ORDER — FUROSEMIDE 40 MG PO TABS: 20.0000 mg | ORAL_TABLET | Freq: Every day | ORAL | Status: DC

## 2024-02-16 MED ORDER — ROSUVASTATIN CALCIUM 40 MG PO TABS: 20.0000 mg | ORAL_TABLET | Freq: Every day | ORAL | Status: DC

## 2024-02-16 NOTE — Telephone Encounter (Signed)
 Kidney function up some but better than 4 months ago. Try to reduce lasix  to 20 mg daily. If she develops swelling or weight gain of 2-3 lbs overnight can take an extra 20 mg. Liver function up. Ask her how much tylenol /ibuprofen she is using and try to reduce. Also lets reduce crestor  20 mg daily(1/2 of 40 mg). Repeat FLP and LFT's in 6 weeks. thanks Lipid Profile; CBC; Comp Met (CMET)   Pt is aware of the above information. She will decrease medications as instructed. Reports she only uses tylenol /ibuprofen rarely and only as needed.  She will weigh herself daily and repeat l/l in 6 weeks.  Labs ordered and released.  Medication list updated.  Pt will call back if any questions or concerns.

## 2024-02-23 ENCOUNTER — Other Ambulatory Visit: Payer: Self-pay | Admitting: Nurse Practitioner

## 2024-02-23 ENCOUNTER — Other Ambulatory Visit: Payer: Self-pay | Admitting: Cardiology

## 2024-02-23 DIAGNOSIS — F331 Major depressive disorder, recurrent, moderate: Secondary | ICD-10-CM

## 2024-02-23 DIAGNOSIS — E782 Mixed hyperlipidemia: Secondary | ICD-10-CM

## 2024-02-24 NOTE — Telephone Encounter (Signed)
Pharmacy requested refill. Pended Rx and sent to Jessica for approval due to HIGH ALERT Warning.  

## 2024-02-26 ENCOUNTER — Other Ambulatory Visit: Payer: Self-pay | Admitting: Nurse Practitioner

## 2024-02-26 DIAGNOSIS — E782 Mixed hyperlipidemia: Secondary | ICD-10-CM

## 2024-02-26 NOTE — Telephone Encounter (Signed)
 Refused pt medication due to cardiologist wanting pt to only take  0.6=5 tablets for the next 6 weeks until they do a follow up. Sent to Roxan Plough NP due to covering pts PCP inbasket for further approval.  Parthenia Olivia HERO, PA-C to [facility] Triage     02/16/24  7:39 AM Result Note Kidney function up some but better than 4 months ago. Try to reduce lasix  to 20 mg daily. If she develops swelling or weight gain of 2-3 lbs overnight can take an extra 20 mg. Liver function up. Ask her how much tylenol /ibuprofen she is using and try to reduce. Also lets reduce crestor  20 mg daily(1/2 of 40 mg). Repeat FLP and LFT's in 6 weeks. thanks

## 2024-02-29 ENCOUNTER — Other Ambulatory Visit: Payer: Self-pay | Admitting: Nurse Practitioner

## 2024-02-29 DIAGNOSIS — E782 Mixed hyperlipidemia: Secondary | ICD-10-CM

## 2024-03-02 ENCOUNTER — Other Ambulatory Visit: Payer: Self-pay | Admitting: Nurse Practitioner

## 2024-03-02 DIAGNOSIS — E782 Mixed hyperlipidemia: Secondary | ICD-10-CM

## 2024-03-02 NOTE — Telephone Encounter (Signed)
 I left a detailed message for patient informing her of providers response and strongly suggested that she contact her cardiologist.

## 2024-03-02 NOTE — Telephone Encounter (Signed)
 Spoke with patient and patient asked that we change her dose to a 20 mg tablet as she is now cutting the 40 mg in half. It appears this was changed by the cardiologist.  Please advise

## 2024-03-02 NOTE — Telephone Encounter (Signed)
Should be filled by cardiology

## 2024-03-03 ENCOUNTER — Other Ambulatory Visit: Payer: Self-pay | Admitting: Nurse Practitioner

## 2024-03-03 DIAGNOSIS — E782 Mixed hyperlipidemia: Secondary | ICD-10-CM

## 2024-03-07 ENCOUNTER — Telehealth: Payer: Self-pay | Admitting: Cardiology

## 2024-03-07 NOTE — Telephone Encounter (Signed)
Patient Notified and agreed. 

## 2024-03-07 NOTE — Telephone Encounter (Signed)
 Copied from CRM [id number]. Topic: Clinical - Prescription Issue >> Mar 04, 2024  3:19 PM Miranda [name] wrote: Reason for CRM:  rosuvastatin  (CRESTOR ) 40 MG tablet  Patient calling because she wanted her medication reduced to 20 mg, instead of cutting the medication. If that is not possible she will continue to cut her medication. Please advise.

## 2024-03-07 NOTE — Telephone Encounter (Signed)
*  STAT* If patient is at the pharmacy, call can be transferred to refill team.   1. Which medications need to be refilled? (please list name of each medication and dose if known)  rosuvastatin (CRESTOR) 40 MG tablet  2. Which pharmacy/location (including street and city if local pharmacy) is medication to be sent to? [place] ([place] [place] [address]  3. Do they need a 30 day or 90 day supply?  90 day supply

## 2024-03-08 MED ORDER — ROSUVASTATIN CALCIUM 40 MG PO TABS: 20.0000 mg | ORAL_TABLET | Freq: Every day | ORAL | 3 refills | Status: DC

## 2024-03-09 ENCOUNTER — Other Ambulatory Visit: Payer: Self-pay | Admitting: Cardiology

## 2024-03-09 MED ORDER — ROSUVASTATIN CALCIUM 40 MG PO TABS: 20.0000 mg | ORAL_TABLET | Freq: Every day | ORAL | 3 refills | Status: DC

## 2024-03-22 ENCOUNTER — Telehealth (HOSPITAL_BASED_OUTPATIENT_CLINIC_OR_DEPARTMENT_OTHER): Admitting: Family

## 2024-03-22 DIAGNOSIS — G479 Sleep disorder, unspecified: Secondary | ICD-10-CM

## 2024-03-22 DIAGNOSIS — F3341 Major depressive disorder, recurrent, in partial remission: Secondary | ICD-10-CM | POA: Diagnosis not present

## 2024-03-22 DIAGNOSIS — I5023 Acute on chronic systolic (congestive) heart failure: Secondary | ICD-10-CM

## 2024-03-22 DIAGNOSIS — Z95811 Presence of heart assist device: Secondary | ICD-10-CM | POA: Insufficient documentation

## 2024-03-22 DIAGNOSIS — J449 Chronic obstructive pulmonary disease, unspecified: Secondary | ICD-10-CM

## 2024-03-22 MED ORDER — QUETIAPINE FUMARATE 200 MG PO TABS: 200.0000 mg | ORAL_TABLET | Freq: Every day | ORAL | 2 refills | Status: AC

## 2024-03-22 NOTE — Progress Notes (Signed)
 Virtual Visit via Video Note  I connected with Miranda [name] on 03/22/24 at  1:00 PM EDT by a video enabled telemedicine application and verified that I am speaking with the correct person using two identifiers.  Location: Patient: Home Provider: Office   I discussed the limitations of evaluation and management by telemedicine and the availability of in person appointments. The patient expressed understanding and agreed to proceed.  I discussed the assessment and treatment plan with the patient. The patient was provided an opportunity to ask questions and all were answered. The patient agreed with the plan and demonstrated an understanding of the instructions.   The patient was advised to call back or seek an in-person evaluation if the symptoms worsen or if the condition fails to improve as anticipated.  I provided 10 minutes of non-face-to-face time during this encounter.   Staci LOISE Kerns, NP   BH MD/PA/NP OP Progress Note  03/22/2024 1:10 PM Miranda [name]  MRN:  [id number]  Chief Complaint: Medication management   HPI: Miranda [name] 58 year old African-American female presents for medication management appointment.  Seen and evaluated via virtual platform care agility.  Miranda [name] initially presented due to sleep disturbance issues.  Tried multiple medications in the past.  She reports Seroquel  has been beneficial.  As she reports she has been taking and tolerating well.  10/19/23- EKG results QTc 376/405.  Reports she was recently seen and evaluated by her cardiologist with a echocardiogram completed.  Patient is currently prescribed Wellbutrin  150 mg daily and Seroquel  200 mg nightly.  She denied any concerns at her cardiology appointment.    No concerns related to suicidal or homicidal ideations.  Denies auditory or visual hallucinations.  She is pleasant calm cooperative.  Answered all questions appropriately.  Does not appear to responding to internal stimuli.  Rating her  depression and anxiety 2 out of 10 with 10 being the worst.  Reports a good appetite.  States she is resting well throughout the night and medications.  No other documented concerns noted at this visit.  Discussed following up for prolactin, CMP, CBC lipid panel and A1c which continues to be monitored by her primary care provider.  Patient to follow-up in 5 months for medication management.  Visit Diagnosis:    ICD-10-CM   1. Sleep disturbance  G47.9     2. Recurrent major depressive disorder, in partial remission (HCC)  F33.41       Past Psychiatric History:   Past Medical History:  Past Medical History:  Diagnosis Date   Anemia    Anxiety    At risk for sudden cardiac death November 01, 2012   CHF - Combined Systolic + Diastolic Dysfunction. EF of 15-20% with Grade II diastolic dysfunction on echo 10/27/12 10/28/2012   CKD (chronic kidney disease)    Coronary artery disease    Depression    .   Dyspnea    none per pt on 04/23/22   Fibroids    Heart murmur    History of blood transfusion 2014   Hypertension    Hypothyroidism    S/P cardiac catheterization, 11/01/12, normal coronaries with minimal luminal irregularities in RCA system 10/30/2012   Thyroid  disease    hypothyroidism    Past Surgical History:  Procedure Laterality Date   AORTIC VALVE REPLACEMENT N/A 03/12/2017   Procedure: AORTIC VALVE REPLACEMENT (AVR) with Mitral Annuloplasty;  Surgeon: Lucas Dorise POUR, MD;  Location: [facility] OR;  Service: Open Heart Surgery;  Laterality: N/A;   EXTERNAL FIXATION LEG  Right 04/24/2022   Procedure: EXTERNAL FIXATION RIGHT LEG;  Surgeon: Barton Drape, MD;  Location: [facility] OR;  Service: Orthopedics;  Laterality: Right;   EXTERNAL FIXATION REMOVAL Right 05/07/2022   Procedure: REMOVAL EXTERNAL FIXATION LEG right;  Surgeon: Barton Drape, MD;  Location: [facility] OR;  Service: Orthopedics;  Laterality: Right;  120 min   HARDWARE REMOVAL Right 11/26/2022   Procedure: Right ankle removal of hardware  (syndesmotic screws x2);  Surgeon: Barton Drape, MD;  Location: [facility];  Service: Orthopedics;  Laterality: Right;  60   LEFT AND RIGHT HEART CATHETERIZATION WITH CORONARY ANGIOGRAM N/A 10/29/2012   Procedure: LEFT AND RIGHT HEART CATHETERIZATION WITH CORONARY ANGIOGRAM;  Surgeon: Alm LELON Clay, MD;  Location: [facility] CATH LAB;  Service: Cardiovascular;  Laterality: N/A;   MITRAL VALVE REPAIR N/A 03/12/2017   Procedure: MITRAL VALVE REPAIR (MVR);  Surgeon: Lucas Dorise POUR, MD;  Location: [facility] OR;  Service: Open Heart Surgery;  Laterality: N/A;  mitral annuloplasty   MULTIPLE EXTRACTIONS WITH ALVEOLOPLASTY N/A 01/21/2017   Procedure: Extraction of tooth #'s 4-13 with alveoloplasty;  Surgeon: Cyndee Tanda FALCON, DDS;  Location: [facility] OR;  Service: Oral Surgery;  Laterality: N/A;   ORIF ANKLE FRACTURE Right 05/07/2022   Procedure: OPEN REDUCTION INTERNAL FIXATION (ORIF)right  ANKLE FRACTURE possible syndesmosis and/or deltoid fixation, possible allograft;  Surgeon: Barton Drape, MD;  Location: [facility] OR;  Service: Orthopedics;  Laterality: Right;  120 min   RIGHT/LEFT HEART CATH AND CORONARY ANGIOGRAPHY N/A 01/09/2017   Procedure: Right/Left Heart Cath and Coronary Angiography;  Surgeon: Burnard Debby LABOR, MD;  Location: [facility] INVASIVE CV LAB;  Service: Cardiovascular;  Laterality: N/A;   TEE WITHOUT CARDIOVERSION N/A 03/12/2017   Procedure: TRANSESOPHAGEAL ECHOCARDIOGRAM (TEE);  Surgeon: Lucas Dorise POUR, MD;  Location: [facility] OR;  Service: Open Heart Surgery;  Laterality: N/A;    Family Psychiatric History:   Family History:  Family History  Problem Relation Age of Onset   Stroke Mother    Hypertension Mother    Liver disease Mother        autoimmune   Heart disease Mother    Colon polyps Mother    Stroke Father    Hypertension Father    Hypertension Maternal Grandmother    Heart disease Maternal Grandmother    Bone cancer Paternal Grandfather    Bone cancer Maternal Uncle    Arthritis  Paternal Uncle    Breast cancer Cousin     Social History:  Social History   Socioeconomic History   Marital status: Single    Spouse name: Not on file   Number of children: 1   Years of education: 12th   Highest education level: Not on file  Occupational History   Occupation: unemployed    Employer: [employer]  Tobacco Use   Smoking status: Former    Current packs/day: 0.00    Average packs/day: 0.5 packs/day for 5.0 years (2.5 ttl pk-yrs)    Types: Cigars, Cigarettes    Start date: 08/04/1973    Quit date: 08/04/1978    Years since quitting: 45.6   Smokeless tobacco: Never   Tobacco comments:    1 cigar daily-quit 10/24/12    Vaping Use   Vaping status: Never Used  Substance and Sexual Activity   Alcohol use: Not Currently    Comment: 2 drinks a week   Drug use: Not Currently    Frequency: 5.0 times per week    Types: Marijuana    Comment: none since  2015   Sexual activity: Not Currently    Birth control/protection: Post-menopausal  Other Topics Concern   Not on file  Social History Narrative   Patient lives at home alone. (Condo)   Caffeine use: 1/2 soda daily   Diet   Martial status:single   Is it one or more stories? Yes   How many persons live in your home? 1   Do you have any pets in your home? No   Current or past profession: Company secretary   Do you exercise? No   Do you have a living will? No    Do you have a DNR form? No    Do you have signed POA/HPOA forms? No    Social Drivers of Corporate investment banker Strain: Patient Declined (10/16/2023)   Overall Financial Resource Strain (CARDIA)    Difficulty of Paying Living Expenses: Patient declined  Food Insecurity: Patient Declined (10/16/2023)   Hunger Vital Sign    Worried About Running Out of Food in the Last Year: Patient declined    Ran Out of Food in the Last Year: Patient declined  Transportation Needs: Patient Declined (10/16/2023)   PRAPARE - Scientist, research (physical sciences) (Medical): Patient declined    Lack of Transportation (Non-Medical): Patient declined  Physical Activity: Insufficiently Active (10/16/2023)   Exercise Vital Sign    Days of Exercise per Week: 3 days    Minutes of Exercise per Session: 20 min  Stress: Stress Concern Present (10/16/2023)   [employer] of Occupational Health - Occupational Stress Questionnaire    Feeling of Stress : To some extent  Social Connections: Unknown (10/16/2023)   Social Connection and Isolation Panel    Frequency of Communication with Friends and Family: More than three times a week    Frequency of Social Gatherings with Friends and Family: Patient declined    Attends Religious Services: Patient declined    Database administrator or Organizations: No    Attends Engineer, structural: Not on file    Marital Status: Never married    Allergies:  Allergies  Allergen Reactions   Codeine Hives   Rexulti  [Brexpiprazole ]     Stomach cramps   Wellbutrin  [Bupropion ]     Excessive sweating     Metabolic Disorder Labs: Lab Results  Component Value Date   HGBA1C 5.4 06/08/2019   MPG 108 06/08/2019   MPG 128 03/10/2017   No results found for: PROLACTIN Lab Results  Component Value Date   CHOL 125 02/15/2024   TRIG 89 02/15/2024   HDL 40 02/15/2024   CHOLHDL 3.1 02/15/2024   VLDL 11 09/26/2016   LDLCALC 68 02/15/2024   LDLCALC 69 04/20/2023   Lab Results  Component Value Date   TSH 1.68 10/19/2023   TSH 3.35 09/19/2022    Therapeutic Level Labs: No results found for: LITHIUM No results found for: VALPROATE No results found for: CBMZ  Current Medications: Current Outpatient Medications  Medication Sig Dispense Refill   acetaminophen  (TYLENOL ) 500 MG tablet Take 1,000 mg by mouth every 6 (six) hours as needed for mild pain.     aspirin  EC 81 MG EC tablet Take 1 tablet (81 mg total) by mouth daily.     buPROPion  (WELLBUTRIN  XL) 150 MG 24 hr tablet Take 1 tablet  by mouth once daily 90 tablet 0   carvedilol  (COREG ) 12.5 MG tablet Take 1 tablet by mouth twice daily 180 tablet 0   Cholecalciferol (D3)  50 MCG (2000 UT) TABS Take 2,000 Units by mouth daily.     ferrous sulfate  325 (65 FE) MG EC tablet Take 1 tablet (325 mg total) by mouth 2 (two) times daily. 180 tablet 3   fluticasone  (FLONASE ) 50 MCG/ACT nasal spray USE 1 SPRAY(S) IN EACH NOSTRIL TWICE DAILY AS NEEDED FOR ALLERGIES OR  RHINITIS 16 g 5   furosemide  (LASIX ) 40 MG tablet Take 0.5 tablets (20 mg total) by mouth daily.     levothyroxine  (SYNTHROID ) 100 MCG tablet TAKE 1 TABLET BY MOUTH ONCE DAILY BEFORE BREAKFAST 90 tablet 0   lisinopril  (ZESTRIL ) 2.5 MG tablet Take 1 tablet (2.5 mg total) by mouth daily. 90 tablet 3   QUEtiapine  (SEROQUEL ) 200 MG tablet Take 1 tablet (200 mg total) by mouth at bedtime. 90 tablet 2   rosuvastatin  (CRESTOR ) 40 MG tablet Take 0.5 tablets (20 mg total) by mouth daily. 45 tablet 3   spironolactone  (ALDACTONE ) 25 MG tablet Take 1/2 (one-half) tablet by mouth once daily 45 tablet 3   No current facility-administered medications for this visit.     Musculoskeletal: Virtual platform  Psychiatric Specialty Exam: Review of Systems  Last menstrual period 01/17/2015.There is no height or weight on file to calculate BMI.  General Appearance: Casual noted to be resting on the couch  Eye Contact:  Good  Speech:  Clear and Coherent  Volume:  Normal  Mood:  Anxious and Depressed  Affect:  Congruent  Thought Process:  Coherent  Orientation:  Full (Time, Place, and Person)  Thought Content: Logical   Suicidal Thoughts:  No  Homicidal Thoughts:  No  Memory:  Immediate;   Good Recent;   Good  Judgement:  Good  Insight:  Good  Psychomotor Activity:  Normal  Concentration:  Concentration: Good  Recall:  Good  Fund of Knowledge: Good  Language: Good  Akathisia:  No  Handed:  Right  AIMS (if indicated): not done  Assets:  Communication Skills Desire for  Improvement  ADL's:  Intact  Cognition: WNL  Sleep:  Good   Screenings: AUDIT    Flowsheet Row Office Visit from 09/27/2018 in [facility] Office Visit from 05/24/2018 in [facility]  Alcohol Use Disorder Identification Test Final Score (AUDIT) 4 3   Mini-Mental    Flowsheet Row Office Visit from 11/28/2022 in [facility]  Total Score (max 30 points ) 30   PHQ2-9    Flowsheet Row Office Visit from 11/30/2023 in [facility] Office Visit from 06[facility]29/2023 in [facility] Office Visit from 04/20/2023 in [facility] Office Visit from 11/28/2022 in [facility] Clinical Support from 11/21/2021 in [facility]  PHQ-2 Total Score 0 3 0 0 3  PHQ-9 Total Score -- 6 -- -- 8   Flowsheet Row Admission (Discharged) from 05/07/2022 in [place] [facility] Admission (Discharged) from 04/24/2022 in [place] [facility]  C-SSRS RISK CATEGORY No Risk No Risk     Assessment and Plan: Miranda [name] 58 year old African-American female presents for medication management follow-up appointment.  Overall, states her sleeping hygiene has improved.  States her mood is stabilized.  Currently prescribed Wellbutrin  150 daily by her primary care provider.  Reports she has been taking and tolerating medications well.  Denying any medication side effects.  No concerns related to illicit drug use or substance abuse history.  She is denying suicidal or homicidal ideations.  Patient to follow-up 4 to 5 months with medication adherence/tolerability, she was amendable to plan.  Support encouragement reassurance was provided.  Collaboration of Care: Collaboration of Care: Medication Management AEB continue Seroquel  200  mg nightly and Wellbutrin  150 mg  Patient/Guardian was advised Release of Information must be obtained prior to any record release in order to collaborate their care with an outside provider. Patient/Guardian was advised if they have not already done so to contact the registration department to sign all necessary forms in order for us  to release information regarding their care.   Consent: Patient/Guardian gives verbal consent for treatment and assignment of benefits for services provided during this visit. Patient/Guardian expressed understanding and agreed to proceed.    Staci LOISE Kerns, NP 03/22/2024, 1:10 PM

## 2024-03-28 ENCOUNTER — Other Ambulatory Visit: Payer: Self-pay | Admitting: Cardiology

## 2024-04-05 DIAGNOSIS — Z79899 Other long term (current) drug therapy: Secondary | ICD-10-CM | POA: Diagnosis not present

## 2024-04-05 DIAGNOSIS — E782 Mixed hyperlipidemia: Secondary | ICD-10-CM | POA: Diagnosis not present

## 2024-04-05 LAB — LIPID PANEL

## 2024-04-06 ENCOUNTER — Ambulatory Visit: Payer: Self-pay | Admitting: Physician Assistant

## 2024-04-06 LAB — HEPATIC FUNCTION PANEL
ALT: 19 IU/L (ref 0–32)
AST: 30 IU/L (ref 0–40)
Albumin: 4.3 g/dL (ref 3.8–4.9)
Alkaline Phosphatase: 77 IU/L (ref 44–121)
Bilirubin Total: 0.3 mg/dL (ref 0.0–1.2)
Bilirubin, Direct: 0.12 mg/dL (ref 0.00–0.40)
Total Protein: 8.9 g/dL — ABNORMAL HIGH (ref 6.0–8.5)

## 2024-04-06 LAB — LIPID PANEL
Cholesterol, Total: 153 mg/dL (ref 100–199)
HDL: 49 mg/dL (ref >39)
LDL CALC COMMENT:: 3.1 ratio (ref 0.0–4.4)
LDL Chol Calc (NIH): 90 mg/dL (ref 0–99)
Triglycerides: 71 mg/dL (ref 0–149)
VLDL Cholesterol Cal: 14 mg/dL (ref 5–40)

## 2024-04-06 NOTE — Telephone Encounter (Signed)
 Pt returning call

## 2024-04-21 ENCOUNTER — Encounter: Payer: Self-pay | Admitting: Nurse Practitioner

## 2024-04-21 NOTE — Patient Instructions (Signed)
 1.) Visit your local pharmacy to receive your covid booster.

## 2024-04-22 ENCOUNTER — Ambulatory Visit (INDEPENDENT_AMBULATORY_CARE_PROVIDER_SITE_OTHER): Admitting: Nurse Practitioner

## 2024-04-22 ENCOUNTER — Encounter: Payer: Self-pay | Admitting: Nurse Practitioner

## 2024-04-22 VITALS — BP 136/92 | HR 68 | Temp 97.9°F | Wt 152.0 lb

## 2024-04-22 DIAGNOSIS — E782 Mixed hyperlipidemia: Secondary | ICD-10-CM

## 2024-04-22 DIAGNOSIS — M8580 Other specified disorders of bone density and structure, unspecified site: Secondary | ICD-10-CM

## 2024-04-22 DIAGNOSIS — Z23 Encounter for immunization: Secondary | ICD-10-CM | POA: Diagnosis not present

## 2024-04-22 DIAGNOSIS — E039 Hypothyroidism, unspecified: Secondary | ICD-10-CM

## 2024-04-22 DIAGNOSIS — I5022 Chronic systolic (congestive) heart failure: Secondary | ICD-10-CM

## 2024-04-22 DIAGNOSIS — F331 Major depressive disorder, recurrent, moderate: Secondary | ICD-10-CM

## 2024-04-22 MED ORDER — ROSUVASTATIN CALCIUM 20 MG PO TABS: 20.0000 mg | ORAL_TABLET | Freq: Every day | ORAL | 1 refills | Status: AC

## 2024-04-22 MED ORDER — FUROSEMIDE 20 MG PO TABS: 20.0000 mg | ORAL_TABLET | Freq: Every day | ORAL | 1 refills | Status: AC

## 2024-04-22 NOTE — Progress Notes (Signed)
 Careteam: Patient Care Team: Caro Harlene POUR, NP as PCP - General (Geriatric Medicine) Jeffrie Oneil BROCKS, MD as PCP - Cardiology (Cardiology) Starla Harland BROCKS, MD as Consulting Physician (Obstetrics and Gynecology) Cherrie, Toribio SAUNDERS, MD as Consulting Physician (Cardiology) Margaret, Eduard SAUNDERS, MD as Consulting Physician (Neurology)  PLACE OF SERVICE:  [facility]  Advanced Directive information    Allergies  Allergen Reactions   Codeine Hives   Rexulti  [Brexpiprazole ]     Stomach cramps   Wellbutrin  [Bupropion ]     Excessive sweating     Chief Complaint  Patient presents with   Medical Management of Chronic Issues    6 month follow-up. Discussed need for covid booster (pharmacy). Flu vaccine today. Patient would like to discuss pneumonia vaccine recommendation.     HPI:  Discussed the use of AI scribe software for clinical note transcription with the patient, who gave verbal consent to proceed.  History of Present Illness Miranda [name] is a 58 year old female who presents for a routine follow-up.  She has no acute issues and denies any recent chest pain. Previously, she experienced chest pain and followed up with cardiology underwent an EKG and echocardiogram. She has not had any further chest pain since the last visit.  Her mood is stable on Wellbutrin  150 mg daily and Seroquel  200 mg at bedtime. She notes that Wellbutrin  causes sweating but is effective for her mood.  Her hypothyroidism is managed with Synthroid  100 mcg daily, and her last TSH was at goal at 1.68 in March.  For hyperlipidemia, she is on Crestor , which was reduced to 20 mg due to liver function concerns. Her liver function has fluctuated in the past, but she has stopped drinking alcohol. Her mother had an autoimmune disorder affecting the liver, but no specific liver issues have been identified in her case.   She is also on Lasix  20 mg daily reduced from 40 mg due to  kidney function concerns and  reports no swelling or shortness of breath since the dose reduction. She typically measures 120/85 at home. She also takes spironolactone  25 mg, half a tablet.  She maintains a low-fat diet and exercises regularly, although she did not exercise today. No weight gain, pain, or swelling, and she reports regular bowel movements aided by eating five prunes a day.    Review of Systems:  Review of Systems  Constitutional:  Negative for chills, fever and weight loss.  HENT:  Negative for tinnitus.   Respiratory:  Negative for cough, sputum production and shortness of breath.   Cardiovascular:  Negative for chest pain, palpitations and leg swelling.  Gastrointestinal:  Negative for abdominal pain, constipation, diarrhea and heartburn.  Genitourinary:  Negative for dysuria, frequency and urgency.  Musculoskeletal:  Negative for back pain, falls, joint pain and myalgias.  Skin: Negative.   Neurological:  Negative for dizziness and headaches.  Psychiatric/Behavioral:  Negative for depression and memory loss. The patient does not have insomnia.     Past Medical History:  Diagnosis Date   Anemia    Anxiety    At risk for sudden cardiac death 11/15/2012   CHF - Combined Systolic + Diastolic Dysfunction. EF of 15-20% with Grade II diastolic dysfunction on echo 10/27/12 10/28/2012   CKD (chronic kidney disease)    Coronary artery disease    Depression    .   Dyspnea    none per pt on 04/23/22   Fibroids    Heart murmur  History of blood transfusion 2014   Hypertension    Hypothyroidism    S/P cardiac catheterization, 10/29/12, normal coronaries with minimal luminal irregularities in RCA system 10/30/2012   Thyroid  disease    hypothyroidism   Past Surgical History:  Procedure Laterality Date   AORTIC VALVE REPLACEMENT N/A 03/12/2017   Procedure: AORTIC VALVE REPLACEMENT (AVR) with Mitral Annuloplasty;  Surgeon: Lucas Dorise POUR, MD;  Location: [facility] OR;  Service: Open Heart Surgery;  Laterality:  N/A;   EXTERNAL FIXATION LEG Right 04/24/2022   Procedure: EXTERNAL FIXATION RIGHT LEG;  Surgeon: Barton Drape, MD;  Location: [facility] OR;  Service: Orthopedics;  Laterality: Right;   EXTERNAL FIXATION REMOVAL Right 05/07/2022   Procedure: REMOVAL EXTERNAL FIXATION LEG right;  Surgeon: Barton Drape, MD;  Location: [facility] OR;  Service: Orthopedics;  Laterality: Right;  120 min   HARDWARE REMOVAL Right 11/26/2022   Procedure: Right ankle removal of hardware (syndesmotic screws x2);  Surgeon: Barton Drape, MD;  Location: [facility];  Service: Orthopedics;  Laterality: Right;  60   LEFT AND RIGHT HEART CATHETERIZATION WITH CORONARY ANGIOGRAM N/A 10/29/2012   Procedure: LEFT AND RIGHT HEART CATHETERIZATION WITH CORONARY ANGIOGRAM;  Surgeon: Alm LELON Clay, MD;  Location: [facility] CATH LAB;  Service: Cardiovascular;  Laterality: N/A;   MITRAL VALVE REPAIR N/A 03/12/2017   Procedure: MITRAL VALVE REPAIR (MVR);  Surgeon: Lucas Dorise POUR, MD;  Location[facility] OR;  Service: Open Heart Surgery;  Laterality: N/A;  mitral annuloplasty   MULTIPLE EXTRACTIONS WITH ALVEOLOPLASTY N/A 01/21/2017   Procedure: Extraction of tooth #'s 4-13 with alveoloplasty;  Surgeon: Cyndee Tanda FALCON, DDS;  Location: [facility] OR;  Service: Oral Surgery;  Laterality: N/A;   ORIF ANKLE FRACTURE Right 05/07/2022   Procedure: OPEN REDUCTION INTERNAL FIXATION (ORIF)right  ANKLE FRACTURE possible syndesmosis and/or deltoid fixation, possible allograft;  Surgeon: Barton Drape, MD;  Location: [facility] OR;  Service: Orthopedics;  Laterality: Right;  120 min   RIGHT/LEFT HEART CATH AND CORONARY ANGIOGRAPHY N/A 01/09/2017   Procedure: Right/Left Heart Cath and Coronary Angiography;  Surgeon: Burnard Debby LABOR, MD;  Location: [facility] INVASIVE CV LAB;  Service: Cardiovascular;  Laterality: N/A;   TEE WITHOUT CARDIOVERSION N/A 03/12/2017   Procedure: TRANSESOPHAGEAL ECHOCARDIOGRAM (TEE);  Surgeon: Lucas Dorise POUR, MD;  Location: [facility] OR;  Service: Open  Heart Surgery;  Laterality: N/A;   Social History:   reports that she quit smoking about 45 years ago. Her smoking use included cigars and cigarettes. She started smoking about 50 years ago. She has a 2.5 pack-year smoking history. She has never used smokeless tobacco. She reports that she does not currently use alcohol. She reports that she does not currently use drugs after having used the following drugs: Marijuana. Frequency: 5.00 times per week.  Family History  Problem Relation Age of Onset   Stroke Mother    Hypertension Mother    Liver disease Mother        autoimmune   Heart disease Mother    Colon polyps Mother    Stroke Father    Hypertension Father    Hypertension Maternal Grandmother    Heart disease Maternal Grandmother    Bone cancer Paternal Grandfather    Bone cancer Maternal Uncle    Arthritis Paternal Uncle    Breast cancer Cousin     Medications: Patient's Medications  New Prescriptions   No medications on file  Previous Medications   ACETAMINOPHEN  (TYLENOL ) 500 MG TABLET    Take 1,000 mg by mouth  every 6 (six) hours as needed for mild pain.   ASPIRIN  EC 81 MG EC TABLET    Take 1 tablet (81 mg total) by mouth daily.   BUPROPION  (WELLBUTRIN  XL) 150 MG 24 HR TABLET    Take 1 tablet by mouth once daily   CARVEDILOL  (COREG ) 12.5 MG TABLET    Take 1 tablet by mouth twice daily   CHOLECALCIFEROL (D3) 50 MCG (2000 UT) TABS    Take 2,000 Units by mouth daily.   FERROUS SULFATE  325 (65 FE) MG EC TABLET    Take 1 tablet (325 mg total) by mouth 2 (two) times daily.   FLUTICASONE  (FLONASE ) 50 MCG/ACT NASAL SPRAY    USE 1 SPRAY(S) IN EACH NOSTRIL TWICE DAILY AS NEEDED FOR ALLERGIES OR  RHINITIS   FUROSEMIDE  (LASIX ) 40 MG TABLET    Take 0.5 tablets (20 mg total) by mouth daily.   LEVOTHYROXINE  (SYNTHROID ) 100 MCG TABLET    TAKE 1 TABLET BY MOUTH ONCE DAILY BEFORE BREAKFAST   LISINOPRIL  (ZESTRIL ) 2.5 MG TABLET    Take 1 tablet by mouth once daily   QUETIAPINE  (SEROQUEL ) 200  MG TABLET    Take 1 tablet (200 mg total) by mouth at bedtime.   ROSUVASTATIN  (CRESTOR ) 40 MG TABLET    Take 0.5 tablets (20 mg total) by mouth daily.   SPIRONOLACTONE  (ALDACTONE ) 25 MG TABLET    Take 1/2 (one-half) tablet by mouth once daily  Modified Medications   No medications on file  Discontinued Medications   No medications on file    Physical Exam:  Vitals:   04/22/24 0918 04/22/24 0929  BP: (!) 140/90 (!) 136/92  Pulse: 68   Temp: 97.9 F (36.6 C)   TempSrc: Temporal   SpO2: 99%   Weight: 152 lb (68.9 kg)    Body mass index is 25.29 kg/m. Wt Readings from Last 3 Encounters:  04/22/24 152 lb (68.9 kg)  02/15/24 153 lb (69.4 kg)  11/30/23 148 lb 12.8 oz (67.5 kg)    Physical Exam Constitutional:      General: She is not in acute distress.    Appearance: She is well-developed. She is not diaphoretic.  HENT:     Head: Normocephalic and atraumatic.     Mouth/Throat:     Pharynx: No oropharyngeal exudate.  Eyes:     Conjunctiva/sclera: Conjunctivae normal.     Pupils: Pupils are equal, round, and reactive to light.  Cardiovascular:     Rate and Rhythm: Normal rate and regular rhythm.     Heart sounds: Normal heart sounds.  Pulmonary:     Effort: Pulmonary effort is normal.     Breath sounds: Normal breath sounds.  Abdominal:     General: Bowel sounds are normal.     Palpations: Abdomen is soft.  Musculoskeletal:     Cervical back: Normal range of motion and neck supple.     Right lower leg: No edema.     Left lower leg: No edema.  Skin:    General: Skin is warm and dry.  Neurological:     Mental Status: She is alert.  Psychiatric:        Mood and Affect: Mood normal.     Labs reviewed: Basic Metabolic Panel: Recent Labs    10/19/23 1050 02/15/24 1032  NA 136 138  K 4.5 4.3  CL 101 106  CO2 25 17*  GLUCOSE 54* 77  BUN 23 19  CREATININE 1.24* 1.07*  CALCIUM  10.3 10.1  TSH 1.68  --    Liver Function Tests: Recent Labs    10/19/23 1050  02/15/24 1032 04/05/24 0833  AST 41* 66* 30  ALT 27 50* 19  ALKPHOS  --  95 77  BILITOT 0.4 0.3 0.3  PROT 9.4* 9.1* 8.9*  ALBUMIN   --  4.3 4.3   No results for input(s): LIPASE, AMYLASE in the last 8760 hours. No results for input(s): AMMONIA in the last 8760 hours. CBC: Recent Labs    10/19/23 1050 02/15/24 1032  WBC 4.7 5.4  NEUTROABS 2,825  --   HGB 12.8 12.1  HCT 40.0 39.2  MCV 86.8 90  PLT 189 157   Lipid Panel: Recent Labs    02/15/24 1032 04/05/24 0833  CHOL 125 153  HDL 40 49  LDLCALC 68 90  TRIG 89 71  CHOLHDL 3.1 3.1   TSH: Recent Labs    10/19/23 1050  TSH 1.68   A1C: Lab Results  Component Value Date   HGBA1C 5.4 06/08/2019     Assessment/Plan  Hypothyroidism, unspecified type Assessment & Plan: Maintained on synthroid  100 mcg Tsh at goal in March 2025   Chronic systolic congestive heart failure (HCC) Assessment & Plan: Euvolemic on lasix  20 mg daily and aldactone  12.5 mg daily Also continues on on coreg  BID and lisinopril  with cardiology follow up  Orders: -     Furosemide ; Take 1 tablet (20 mg total) by mouth daily.  Dispense: 90 tablet; Refill: 1  Mixed hyperlipidemia Assessment & Plan: Continues on crestor  20 mg daily- reduced by cardiology due to elevation in liver enzymes which improved after reduction.  Continue dietary modifications  Orders: -     Rosuvastatin  Calcium ; Take 1 tablet (20 mg total) by mouth daily.  Dispense: 90 tablet; Refill: 1  Moderate episode of recurrent major depressive disorder (HCC) Assessment & Plan: Mood is well controlled on current regimen of wellbutrin  and seroquel  at this time. She is currently being followed by behavioral health.    Osteopenia, unspecified location Assessment & Plan: Recommended to take calcium  600 mg twice daily with Vitamin D 2000 units daily and weight bearing activity 30 mins/5 days a week   Immunization due -     Flu vaccine trivalent PF, 6mos and  older(Flulaval,Afluria,Fluarix,Fluzone)     Return in about 6 months (around 10/20/2024) for routine follow up.  Jianni Batten K. Caro BODILY [facility] [phone]

## 2024-04-24 DIAGNOSIS — M8580 Other specified disorders of bone density and structure, unspecified site: Secondary | ICD-10-CM | POA: Insufficient documentation

## 2024-04-24 NOTE — Assessment & Plan Note (Signed)
Recommended to take calcium 600 mg twice daily with Vitamin D 2000 units daily and weight bearing activity 30 mins/5 days a week   

## 2024-04-24 NOTE — Assessment & Plan Note (Signed)
 Euvolemic on lasix  20 mg daily and aldactone  12.5 mg daily Also continues on on coreg  BID and lisinopril  with cardiology follow up

## 2024-04-24 NOTE — Assessment & Plan Note (Addendum)
 Maintained on synthroid  100 mcg Tsh at goal in March 2025

## 2024-04-24 NOTE — Assessment & Plan Note (Signed)
 Continues on crestor  20 mg daily- reduced by cardiology due to elevation in liver enzymes which improved after reduction.  Continue dietary modifications

## 2024-04-24 NOTE — Assessment & Plan Note (Signed)
 Mood is well controlled on current regimen of wellbutrin  and seroquel  at this time. She is currently being followed by behavioral health.

## 2024-05-30 ENCOUNTER — Other Ambulatory Visit: Payer: Self-pay | Admitting: Cardiology

## 2024-05-30 ENCOUNTER — Other Ambulatory Visit: Payer: Self-pay | Admitting: Nurse Practitioner

## 2024-05-30 DIAGNOSIS — F331 Major depressive disorder, recurrent, moderate: Secondary | ICD-10-CM

## 2024-05-30 NOTE — Telephone Encounter (Signed)
 LOV: 04/22/24 NOV:10/21/24 Last Refill: buPROPion  (WELLBUTRIN  XL) 150 MG 24 hr tablet 02/24/24 90 tablets 0 refills

## 2024-06-27 ENCOUNTER — Other Ambulatory Visit: Payer: Self-pay | Admitting: Nurse Practitioner

## 2024-07-06 ENCOUNTER — Other Ambulatory Visit: Payer: Self-pay

## 2024-07-06 NOTE — Progress Notes (Signed)
 Patient appearing on report for [employer] - Hypertension Control report due to last documented ambulatory blood pressure of 136/92 on 04/22/2024. Next appointment with PCP is 10/21/2024   Outreached patient to discuss hypertension control and medication management.   Current antihypertensives:  Carvedilol  12.5 mg twice daily Lisinopril  2.5 mg daily Spironolactone  25 mg daily  Patient has an automated upper arm home BP machine. However, the patient declined to have her blood pressure checked at home. Completed full medication review. Reports adherence to all medications.    Assessment/Plan: Currently uncontrolled Unable to assess blood pressure at home  Unable to review BP goal, long term complications, or appropriate diet due to patient declining further discussion Recommended to continue medications as prescribed Completed full medication review  Woodie Jock, PharmD PGY1 Pharmacy Resident  07/06/2024

## 2024-07-12 ENCOUNTER — Telehealth: Payer: Self-pay

## 2024-07-12 NOTE — Telephone Encounter (Signed)
 Copied from CRM [id number]. Topic: Complaint (DO NOT CONVERT) - Staff >> Jul 12, 2024 10:15 AM DeAngela L wrote: Date of Incident: 07/06/24 last week the patient received a call from the pharmacist Bitcon, Katrina, [facility], while she not home   Details of complaint: the conversation started with a different Tone and this was off putting for the patient to be spoken to like this also, patient states her blood pressure machine was not with her and she was out so she answered she could not take her blood pressure, and states there were no additional questions ask after and then later she noticed that it was put in her MyChart that she declined to take her blood pressure   How would the patient like to see it resolved? The patient wants this removed from MyChart cause this is false information  On a scale of 1-10, how was your experience? 1  What would it take to bring it to a 10? She needs to learn how to talk to people   Route to Research Officer, [employer].

## 2024-08-29 ENCOUNTER — Other Ambulatory Visit: Payer: Self-pay | Admitting: Cardiology

## 2024-08-29 ENCOUNTER — Other Ambulatory Visit: Payer: Self-pay | Admitting: Nurse Practitioner

## 2024-08-29 DIAGNOSIS — F331 Major depressive disorder, recurrent, moderate: Secondary | ICD-10-CM

## 2024-08-30 NOTE — Telephone Encounter (Signed)
 Please advise if ok to refill? The medication has high allergy alert.

## 2024-10-21 ENCOUNTER — Ambulatory Visit: Payer: Self-pay | Admitting: Nurse Practitioner

## 2024-12-01 ENCOUNTER — Ambulatory Visit: Payer: Self-pay | Admitting: Nurse Practitioner

## 2024-12-09 ENCOUNTER — Ambulatory Visit: Admitting: Nurse Practitioner
